# Patient Record
Sex: Male | Born: 1965
Health system: Southern US, Community
[De-identification: ages and names within clinical notes are randomized; demographics above are authoritative.]

## PROBLEM LIST (undated history)

## (undated) DIAGNOSIS — I509 Heart failure, unspecified: Secondary | ICD-10-CM

## (undated) DIAGNOSIS — Z5189 Encounter for other specified aftercare: Secondary | ICD-10-CM

## (undated) DIAGNOSIS — I10 Essential (primary) hypertension: Secondary | ICD-10-CM

## (undated) DIAGNOSIS — J45909 Unspecified asthma, uncomplicated: Secondary | ICD-10-CM

## (undated) DIAGNOSIS — D649 Anemia, unspecified: Secondary | ICD-10-CM

## (undated) DIAGNOSIS — R0600 Dyspnea, unspecified: Secondary | ICD-10-CM

## (undated) DIAGNOSIS — K5790 Diverticulosis of intestine, part unspecified, without perforation or abscess without bleeding: Secondary | ICD-10-CM

## (undated) DIAGNOSIS — E785 Hyperlipidemia, unspecified: Secondary | ICD-10-CM

## (undated) DIAGNOSIS — R011 Cardiac murmur, unspecified: Secondary | ICD-10-CM

## (undated) DIAGNOSIS — K529 Noninfective gastroenteritis and colitis, unspecified: Secondary | ICD-10-CM

## (undated) DIAGNOSIS — J449 Chronic obstructive pulmonary disease, unspecified: Secondary | ICD-10-CM

## (undated) DIAGNOSIS — K649 Unspecified hemorrhoids: Secondary | ICD-10-CM

## (undated) DIAGNOSIS — N189 Chronic kidney disease, unspecified: Secondary | ICD-10-CM

## (undated) DIAGNOSIS — G4733 Obstructive sleep apnea (adult) (pediatric): Secondary | ICD-10-CM

## (undated) HISTORY — DX: Essential (primary) hypertension: I10

## (undated) HISTORY — DX: Encounter for other specified aftercare: Z51.89

## (undated) HISTORY — DX: Chronic kidney disease, unspecified: N18.9

## (undated) HISTORY — PX: THORACENTESIS: SHX235

## (undated) HISTORY — DX: Noninfective gastroenteritis and colitis, unspecified: K52.9

## (undated) HISTORY — DX: Hyperlipidemia, unspecified: E78.5

## (undated) HISTORY — PX: KIDNEY TRANSPLANT: SHX239

## (undated) HISTORY — DX: Obstructive sleep apnea (adult) (pediatric): G47.33

## (undated) HISTORY — PX: OTHER SURGICAL HISTORY: SHX169

## (undated) SURGERY — Surgical Case
Anesthesia: *Unknown

---

## 1998-01-20 ENCOUNTER — Other Ambulatory Visit: Admission: RE | Admit: 1998-01-20 | Discharge: 1998-01-20 | Payer: Self-pay | Admitting: *Deleted

## 1998-01-25 ENCOUNTER — Ambulatory Visit (HOSPITAL_COMMUNITY): Admission: RE | Admit: 1998-01-25 | Discharge: 1998-01-25 | Payer: Self-pay | Admitting: Nephrology

## 1998-01-25 ENCOUNTER — Other Ambulatory Visit: Admission: RE | Admit: 1998-01-25 | Discharge: 1998-01-25 | Payer: Self-pay | Admitting: Nephrology

## 1998-12-25 ENCOUNTER — Ambulatory Visit (HOSPITAL_COMMUNITY): Admission: RE | Admit: 1998-12-25 | Discharge: 1998-12-25 | Payer: Self-pay | Admitting: Vascular Surgery

## 1998-12-25 ENCOUNTER — Encounter: Payer: Self-pay | Admitting: Vascular Surgery

## 1999-03-13 ENCOUNTER — Ambulatory Visit (HOSPITAL_COMMUNITY): Admission: RE | Admit: 1999-03-13 | Discharge: 1999-03-13 | Payer: Self-pay | Admitting: Vascular Surgery

## 1999-04-12 ENCOUNTER — Ambulatory Visit: Admission: RE | Admit: 1999-04-12 | Discharge: 1999-04-12 | Payer: Self-pay | Admitting: Vascular Surgery

## 1999-04-12 ENCOUNTER — Encounter: Payer: Self-pay | Admitting: Vascular Surgery

## 1999-04-23 ENCOUNTER — Ambulatory Visit: Admission: RE | Admit: 1999-04-23 | Discharge: 1999-04-23 | Payer: Self-pay

## 1999-05-07 ENCOUNTER — Encounter: Payer: Self-pay | Admitting: *Deleted

## 1999-05-07 ENCOUNTER — Ambulatory Visit (HOSPITAL_COMMUNITY): Admission: AD | Admit: 1999-05-07 | Discharge: 1999-05-07 | Payer: Self-pay | Admitting: *Deleted

## 1999-05-07 ENCOUNTER — Emergency Department (HOSPITAL_COMMUNITY): Admission: EM | Admit: 1999-05-07 | Discharge: 1999-05-07 | Payer: Self-pay | Admitting: Emergency Medicine

## 1999-05-11 ENCOUNTER — Ambulatory Visit (HOSPITAL_COMMUNITY): Admission: RE | Admit: 1999-05-11 | Discharge: 1999-05-11 | Payer: Self-pay | Admitting: Vascular Surgery

## 1999-05-15 ENCOUNTER — Observation Stay (HOSPITAL_COMMUNITY): Admission: RE | Admit: 1999-05-15 | Discharge: 1999-05-15 | Payer: Self-pay | Admitting: Interventional Radiology

## 1999-05-15 ENCOUNTER — Encounter: Payer: Self-pay | Admitting: Vascular Surgery

## 1999-05-16 ENCOUNTER — Encounter: Payer: Self-pay | Admitting: Vascular Surgery

## 1999-09-26 ENCOUNTER — Ambulatory Visit (HOSPITAL_COMMUNITY): Admission: RE | Admit: 1999-09-26 | Discharge: 1999-09-26 | Payer: Self-pay | Admitting: Vascular Surgery

## 1999-09-26 ENCOUNTER — Encounter: Payer: Self-pay | Admitting: Vascular Surgery

## 1999-10-18 ENCOUNTER — Encounter: Payer: Self-pay | Admitting: Emergency Medicine

## 1999-10-18 ENCOUNTER — Emergency Department (HOSPITAL_COMMUNITY): Admission: EM | Admit: 1999-10-18 | Discharge: 1999-10-18 | Payer: Self-pay | Admitting: Emergency Medicine

## 1999-11-18 ENCOUNTER — Ambulatory Visit (HOSPITAL_COMMUNITY): Admission: RE | Admit: 1999-11-18 | Discharge: 1999-11-18 | Payer: Self-pay | Admitting: Vascular Surgery

## 1999-11-18 ENCOUNTER — Encounter: Payer: Self-pay | Admitting: Vascular Surgery

## 2000-06-01 ENCOUNTER — Encounter: Payer: Self-pay | Admitting: Vascular Surgery

## 2000-06-02 ENCOUNTER — Inpatient Hospital Stay (HOSPITAL_COMMUNITY): Admission: RE | Admit: 2000-06-02 | Discharge: 2000-06-05 | Payer: Self-pay | Admitting: Vascular Surgery

## 2000-06-29 ENCOUNTER — Ambulatory Visit (HOSPITAL_COMMUNITY): Admission: RE | Admit: 2000-06-29 | Discharge: 2000-06-29 | Payer: Self-pay | Admitting: *Deleted

## 2000-08-20 ENCOUNTER — Ambulatory Visit (HOSPITAL_COMMUNITY): Admission: RE | Admit: 2000-08-20 | Discharge: 2000-08-20 | Payer: Self-pay | Admitting: *Deleted

## 2000-08-21 ENCOUNTER — Ambulatory Visit (HOSPITAL_COMMUNITY): Admission: RE | Admit: 2000-08-21 | Discharge: 2000-08-21 | Payer: Self-pay | Admitting: Vascular Surgery

## 2000-08-21 ENCOUNTER — Encounter: Payer: Self-pay | Admitting: Vascular Surgery

## 2000-09-26 ENCOUNTER — Ambulatory Visit (HOSPITAL_COMMUNITY): Admission: RE | Admit: 2000-09-26 | Discharge: 2000-09-26 | Payer: Self-pay | Admitting: Nephrology

## 2001-01-25 ENCOUNTER — Encounter: Payer: Self-pay | Admitting: Nephrology

## 2001-01-25 ENCOUNTER — Ambulatory Visit (HOSPITAL_COMMUNITY): Admission: RE | Admit: 2001-01-25 | Discharge: 2001-01-25 | Payer: Self-pay | Admitting: Nephrology

## 2001-02-08 ENCOUNTER — Encounter: Admission: RE | Admit: 2001-02-08 | Discharge: 2001-02-08 | Payer: Self-pay | Admitting: *Deleted

## 2013-03-27 ENCOUNTER — Encounter: Payer: Self-pay | Admitting: Pulmonary Disease

## 2013-03-27 ENCOUNTER — Ambulatory Visit (INDEPENDENT_AMBULATORY_CARE_PROVIDER_SITE_OTHER): Payer: BC Managed Care – PPO | Admitting: Pulmonary Disease

## 2013-03-27 VITALS — BP 172/110 | HR 76 | Temp 98.4°F | Ht 70.0 in | Wt 227.0 lb

## 2013-03-27 DIAGNOSIS — R0683 Snoring: Secondary | ICD-10-CM

## 2013-03-27 DIAGNOSIS — G4733 Obstructive sleep apnea (adult) (pediatric): Secondary | ICD-10-CM | POA: Insufficient documentation

## 2013-03-27 DIAGNOSIS — R0609 Other forms of dyspnea: Secondary | ICD-10-CM

## 2013-03-27 HISTORY — DX: Obstructive sleep apnea (adult) (pediatric): G47.33

## 2013-03-27 NOTE — Patient Instructions (Signed)
Will arrange for sleep study Will call to arrange for follow up after sleep study reviewed 

## 2013-03-27 NOTE — Progress Notes (Signed)
Chief Complaint  Patient presents with  . Sleep Consult    self referral. Epworth Score: 8.    History of Present Illness: Jonathon Conley is a 47 y.o. male for evaluation of sleep problems.  His wife has been concerned about his snoring.  This has been present for years, and has been getting worse.  She is concerned that he stops breathing while asleep.  He will wake up hearing himself snore.  He has trouble sleeping on his back and will get a dry mouth at night.  He has a history of refractory hypertension and this caused renal failure, leading to renal transplant.  He met a friend recently who was diagnosed with sleep apnea, and was doing much better after treatment.  As a result he decide to have his sleep problems investigated further.  He takes lasix at 10 pm.  He goes to sleep at 12am.  He falls asleep in about 30 minutes after watching TV.  He wakes up 2 to 3 times to use the bathroom, and goes back to sleep after about 15 to 20 minutes.  He gets out of bed at 7 am.  He feels tired in the extremely tired in the morning.  He denies morning headache.  He does not use anything to help him fall sleep or stay awake.  He occasional gets funny feelings in his legs.  He denies sleep walking, sleep talking, bruxism, or nightmares.  He denies sleep hallucinations, sleep paralysis, or cataplexy.  The Epworth score is 8 out of 24.  Tests:   Jonathon Conley  has a past medical history of Hypertension; Hyperlipidemia; and Chronic kidney disease.  Jonathon Conley  has past surgical history that includes Kidney transplant.  Prior to Admission medications   Medication Sig Start Date End Date Taking? Authorizing Provider  albuterol (PROVENTIL HFA;VENTOLIN HFA) 108 (90 BASE) MCG/ACT inhaler Inhale 2 puffs into the lungs every 6 (six) hours as needed for wheezing.   Yes Historical Provider, MD  amLODipine (NORVASC) 10 MG tablet Take 10 mg by mouth daily.   Yes Historical Provider, MD  atenolol (TENORMIN) 100  MG tablet Take 100 mg by mouth daily.   Yes Historical Provider, MD  doxazosin (CARDURA) 8 MG tablet Take 8 mg by mouth at bedtime.   Yes Historical Provider, MD  enalapril (VASOTEC) 10 MG tablet Take 10 mg by mouth daily.   Yes Historical Provider, MD  mycophenolate (CELLCEPT) 250 MG capsule Take 750 mg by mouth 2 (two) times daily.   Yes Historical Provider, MD  predniSONE (DELTASONE) 5 MG tablet Take 5 mg by mouth daily.   Yes Historical Provider, MD  simvastatin (ZOCOR) 5 MG tablet Take 5 mg by mouth at bedtime.   Yes Historical Provider, MD  tacrolimus (PROGRAF) 1 MG capsule Take 11 mg by mouth 2 (two) times daily.   Yes Historical Provider, MD    No Known Allergies  His family history includes Asthma in his sons; Cancer in his maternal grandfather and maternal grandmother; and Heart disease in his mother.  He  reports that he has never smoked. He has never used smokeless tobacco. He reports that he does not drink alcohol or use illicit drugs.  Review of Systems  Constitutional: Positive for unexpected weight change. Negative for fever, chills, diaphoresis, activity change, appetite change and fatigue.  HENT: Positive for congestion, sneezing and dental problem. Negative for hearing loss, ear pain, nosebleeds, sore throat, facial swelling, rhinorrhea, mouth sores, trouble swallowing, neck pain,  neck stiffness, voice change, postnasal drip, sinus pressure, tinnitus and ear discharge.   Eyes: Negative for photophobia, discharge, itching and visual disturbance.  Respiratory: Positive for shortness of breath. Negative for apnea, cough, choking, chest tightness, wheezing and stridor.   Cardiovascular: Negative for chest pain, palpitations and leg swelling.  Gastrointestinal: Negative for nausea, vomiting, abdominal pain, constipation, blood in stool and abdominal distention.  Genitourinary: Negative for dysuria, urgency, frequency, hematuria, flank pain, decreased urine volume and difficulty  urinating.  Musculoskeletal: Positive for joint swelling. Negative for myalgias, back pain, arthralgias and gait problem.  Skin: Negative for color change, pallor and rash.  Neurological: Negative for dizziness, tremors, seizures, syncope, speech difficulty, weakness, light-headedness, numbness and headaches.  Hematological: Negative for adenopathy. Does not bruise/bleed easily.  Psychiatric/Behavioral: Negative for confusion, sleep disturbance and agitation. The patient is not nervous/anxious.    Physical Exam:  General - No distress ENT - No sinus tenderness, MP 3, enlarged tongue, poor dentition, no oral exudate, no LAN, no thyromegaly, TM clear, pupils equal/reactive Cardiac - s1s2 regular, no murmur, pulses symmetric Chest - No wheeze/rales/dullness, good air entry, normal respiratory excursion Back - No focal tenderness Abd - Soft, non-tender, no organomegaly, + bowel sounds Ext - No edema, prior graft site in Rt arm Neuro - Normal strength, cranial nerves intact Skin - No rashes Psych - Normal mood, and behavior

## 2013-03-27 NOTE — Progress Notes (Deleted)
  Subjective:    Patient ID: Jonathon Conley, male    DOB: 04-24-1966, 47 y.o.   MRN: [id number]  HPI    Review of Systems  Constitutional: Positive for unexpected weight change. Negative for fever, chills, diaphoresis, activity change, appetite change and fatigue.  HENT: Positive for congestion, sneezing and dental problem. Negative for hearing loss, ear pain, nosebleeds, sore throat, facial swelling, rhinorrhea, mouth sores, trouble swallowing, neck pain, neck stiffness, voice change, postnasal drip, sinus pressure, tinnitus and ear discharge.   Eyes: Negative for photophobia, discharge, itching and visual disturbance.  Respiratory: Positive for shortness of breath. Negative for apnea, cough, choking, chest tightness, wheezing and stridor.   Cardiovascular: Negative for chest pain, palpitations and leg swelling.  Gastrointestinal: Negative for nausea, vomiting, abdominal pain, constipation, blood in stool and abdominal distention.  Genitourinary: Negative for dysuria, urgency, frequency, hematuria, flank pain, decreased urine volume and difficulty urinating.  Musculoskeletal: Positive for joint swelling. Negative for myalgias, back pain, arthralgias and gait problem.  Skin: Negative for color change, pallor and rash.  Neurological: Negative for dizziness, tremors, seizures, syncope, speech difficulty, weakness, light-headedness, numbness and headaches.  Hematological: Negative for adenopathy. Does not bruise/bleed easily.  Psychiatric/Behavioral: Negative for confusion, sleep disturbance and agitation. The patient is not nervous/anxious.        Objective:   Physical Exam        Assessment & Plan:

## 2013-03-27 NOTE — Assessment & Plan Note (Signed)
He reports snoring, sleep disruption, witnessed apnea, and daytime sleepiness.  He has history of hypertension causing CKD.  I am concerned he could have sleep apnea.  We discussed how sleep apnea can affect various health problems including risks for hypertension, cardiovascular disease, and diabetes.  We also discussed how sleep disruption can increase risks for accident, such as while driving.  Weight loss as a means of improving sleep apnea was also reviewed.  Additional treatment options discussed were CPAP therapy, oral appliance, and surgical intervention.  To further assess will arrange for in lab sleep study.

## 2013-03-30 ENCOUNTER — Ambulatory Visit (HOSPITAL_BASED_OUTPATIENT_CLINIC_OR_DEPARTMENT_OTHER): Payer: BC Managed Care – PPO | Attending: Pulmonary Disease

## 2013-03-30 VITALS — Ht 71.0 in | Wt 227.0 lb

## 2013-03-30 DIAGNOSIS — R0683 Snoring: Secondary | ICD-10-CM

## 2013-03-30 DIAGNOSIS — N189 Chronic kidney disease, unspecified: Secondary | ICD-10-CM | POA: Insufficient documentation

## 2013-03-30 DIAGNOSIS — Z9989 Dependence on other enabling machines and devices: Secondary | ICD-10-CM

## 2013-03-30 DIAGNOSIS — G4733 Obstructive sleep apnea (adult) (pediatric): Secondary | ICD-10-CM | POA: Insufficient documentation

## 2013-03-30 DIAGNOSIS — I129 Hypertensive chronic kidney disease with stage 1 through stage 4 chronic kidney disease, or unspecified chronic kidney disease: Secondary | ICD-10-CM | POA: Insufficient documentation

## 2013-04-08 ENCOUNTER — Telehealth: Payer: Self-pay | Admitting: Pulmonary Disease

## 2013-04-08 DIAGNOSIS — G4733 Obstructive sleep apnea (adult) (pediatric): Secondary | ICD-10-CM

## 2013-04-08 NOTE — Telephone Encounter (Signed)
LMTCB

## 2013-04-08 NOTE — Telephone Encounter (Signed)
PSG 03/30/13 >> AHI 14.6, SpO2 low 91%.  Sup-optimal titration >> centrals with CPAP and BiPAP.  Will have my nurse schedule ROV to review results.

## 2013-04-08 NOTE — Procedures (Signed)
NAME:  Jonathon Conley, Jonathon Conley NO.:  [id number]  MEDICAL RECORD NO.:  [id number]          PATIENT TYPE:  OUT  LOCATION:  SLEEP CENTER                 FACILITY:  [facility]  PHYSICIAN:  [name], MD        DATE OF BIRTH:  05-Apr-1966  DATE OF STUDY:  03/30/2013                           NOCTURNAL POLYSOMNOGRAM  REFERRING PHYSICIAN:  [name], MD  FACILITY:  [facility] PHYSICIAN:  [name], MD  INDICATION FOR STUDY:  [name] is a 47 year old male who has a history of hypertension and chronic kidney disease.  He also reports snoring, sleep disruption, and daytime sleepiness.  He is referred to sleep lab for evaluation of hypersomnia with obstructive sleep apnea.  Height is 5 feet 11 inches, weight is 227 pounds, BMI is 32.  Neck size is 16 inches.  EPWORTH SLEEPINESS SCORE:  10.  MEDICATIONS:  Reviewed in his medical chart.  SLEEP ARCHITECTURE:  Total recording time was 155 minutes.  Total sleep time was 160 minutes.  Sleep efficiency was 94%.  Sleep latency was 8.5 minutes.  REM latency was 152 minutes.  This portion of the study was notable for lack of supine sleep, and he was observed in all stages of sleep otherwise.  During the titration portion of study, total recording time was 225 minutes.  Total sleep time was 133 minutes.  Sleep efficiency was 59%. Sleep latency was 1 minute.  This portion of the study was notable for lack of slow-wave sleep and he slept in both supine and non-supine positions.  RESPIRATORY DATA:  The average respiratory rate was 16.  Mild snoring was noted by the technician.  During the diagnostic portion of the study, the overall apnea/hypopnea index was 14.6.  There was 1 central apneic event.  The remainder of the events were obstructive in nature.  During the titration portion of the study, the patient is on CPAP of 4 and increasing 9 cm water.  He had development of central apneas with CPAP.  He was  therefore transitioned to BiPAP.  He was started on BiPAP at 13/9, increased to 21/17.  In spite of this transition, he continued to have central apneic events.  OXYGEN DATA:  The baseline oxygenation was 92%.  The oxygen saturation nadir was 91%.  The study was conducted without the use of supplemental oxygen.  CARDIAC DATA:  The average heart rate is 58 and the rhythm strip showed sinus rhythm.  MOVEMENT-PARASOMNIA:  The periodic limb movement index was 0 and the patient had 1 restroom trip.  IMPRESSION:  This study shows evidence for mild-to-moderate obstructive sleep apnea with an apnea/hypopnea index of 14.6 and the oxygen saturation nadir of 91%.  He had difficulties with CPAP or BiPAP titration.  With positive airway pressure therapy, he developed central apneic events.  Additional therapeutic interventions could include having the patient return to the sleep lab for a repeat full night titration study. Alternatively, he could try oral appliance or surgical intervention.     [name], MD [place], [place] Board of Sleep Medicine    VS/MEDQ  D:  04/08/2013 11:16:43  T:  04/08/2013 12:03:55  Job:  [id number]

## 2013-04-10 NOTE — Telephone Encounter (Signed)
Pt returned call. Jonathon Conley  

## 2013-04-10 NOTE — Telephone Encounter (Signed)
LMTCB x2  

## 2013-04-10 NOTE — Telephone Encounter (Signed)
Pt has been scheduled for 05/02/2013 at 4:30pm.

## 2013-05-02 ENCOUNTER — Ambulatory Visit (INDEPENDENT_AMBULATORY_CARE_PROVIDER_SITE_OTHER): Payer: BC Managed Care – PPO | Admitting: Pulmonary Disease

## 2013-05-02 ENCOUNTER — Encounter: Payer: Self-pay | Admitting: Pulmonary Disease

## 2013-05-02 VITALS — BP 136/90 | HR 77 | Temp 98.7°F | Ht 70.0 in | Wt 230.0 lb

## 2013-05-02 DIAGNOSIS — G4733 Obstructive sleep apnea (adult) (pediatric): Secondary | ICD-10-CM

## 2013-05-02 NOTE — Assessment & Plan Note (Signed)
He has mild to moderate obstructive sleep apnea.  I have reviewed the recent sleep study results with the patient.  We discussed how sleep apnea can affect various health problems including risks for hypertension, cardiovascular disease, and diabetes.  We also discussed how sleep disruption can increase risks for accident, such as while driving.  Weight loss as a means of improving sleep apnea was also reviewed.  Additional treatment options discussed were CPAP therapy, oral appliance, and surgical intervention.  Will arrange for auto CPAP set up.  He will do comparison shopping to determine if he can get set up on his own at less expense.

## 2013-05-02 NOTE — Patient Instructions (Signed)
Will arrange for CPAP set up Follow up in 2 months

## 2013-05-02 NOTE — Progress Notes (Signed)
Chief Complaint  Patient presents with  . Follow-up    review sleep study.  no other complaints today.    History of Present Illness: Jonathon Conley is a 47 y.o. male with OSA.  He is here to review his sleep study.  He felt great the next day after using CPAP during the sleep study.  He is anxious to get started.   TESTS: PSG 03/30/13 >> AHI 14.6, SpO2 low 91%.  Sup-optimal titration >> centrals with CPAP and BiPAP.  Jonathon Conley  has a past medical history of Hypertension; Hyperlipidemia; Chronic kidney disease; and OSA (obstructive sleep apnea) (03/27/2013).  Jonathon Conley  has past surgical history that includes Kidney transplant.  Prior to Admission medications   Medication Sig Start Date End Date Taking? Authorizing Provider  albuterol (PROVENTIL HFA;VENTOLIN HFA) 108 (90 BASE) MCG/ACT inhaler Inhale 2 puffs into the lungs every 6 (six) hours as needed for wheezing.   Yes Historical Provider, MD  amLODipine (NORVASC) 10 MG tablet Take 10 mg by mouth daily.   Yes Historical Provider, MD  atenolol (TENORMIN) 100 MG tablet Take 100 mg by mouth daily.   Yes Historical Provider, MD  doxazosin (CARDURA) 8 MG tablet Take 8 mg by mouth at bedtime.   Yes Historical Provider, MD  enalapril (VASOTEC) 10 MG tablet Take 10 mg by mouth daily.   Yes Historical Provider, MD  mycophenolate (CELLCEPT) 250 MG capsule Take 750 mg by mouth 2 (two) times daily.   Yes Historical Provider, MD  predniSONE (DELTASONE) 5 MG tablet Take 5 mg by mouth daily.   Yes Historical Provider, MD  simvastatin (ZOCOR) 5 MG tablet Take 5 mg by mouth at bedtime.   Yes Historical Provider, MD  tacrolimus (PROGRAF) 1 MG capsule Take 11 mg by mouth 2 (two) times daily.   Yes Historical Provider, MD    No Known Allergies   Physical Exam:  General - No distress ENT - No sinus tenderness, MP 3, enlarged tongue, poor dentition, no oral exudate, no LAN Cardiac - s1s2 regular, no murmur Chest - No  wheeze/rales/dullness Back - No focal tenderness Abd - Soft, non-tender Ext - No edema Neuro - Normal strength Skin - No rashes Psych - normal mood, and behavior   Assessment/Plan:  [name], MD [facility] Pager:  [phone]

## 2013-07-07 ENCOUNTER — Ambulatory Visit: Payer: BC Managed Care – PPO | Admitting: Pulmonary Disease

## 2013-07-10 ENCOUNTER — Encounter: Payer: Self-pay | Admitting: Pulmonary Disease

## 2014-03-06 ENCOUNTER — Ambulatory Visit (INDEPENDENT_AMBULATORY_CARE_PROVIDER_SITE_OTHER): Payer: PRIVATE HEALTH INSURANCE | Admitting: Family Medicine

## 2014-03-06 VITALS — BP 132/90 | HR 77 | Temp 98.2°F | Resp 18 | Ht 69.75 in | Wt 221.6 lb

## 2014-03-06 DIAGNOSIS — J329 Chronic sinusitis, unspecified: Secondary | ICD-10-CM

## 2014-03-06 DIAGNOSIS — J209 Acute bronchitis, unspecified: Secondary | ICD-10-CM

## 2014-03-06 MED ORDER — AMOXICILLIN-POT CLAVULANATE 875-125 MG PO TABS: 1.0000 | ORAL_TABLET | Freq: Two times a day (BID) | ORAL | Status: DC

## 2014-03-06 MED ORDER — PREDNISONE 20 MG PO TABS: ORAL_TABLET | ORAL | Status: DC

## 2014-03-06 NOTE — Progress Notes (Signed)
°  This chart was scribed for Caroleen Hamman, MD by Eston Mould, ED Scribe. This patient was seen in room Room/bed 2 and the patient's care was started at 12:13 PM. Subjective:    Patient ID: Jonathon Conley, male    DOB: 1966-05-31, 48 y.o.   MRN: [id number] Chief Complaint  Patient presents with   Cough    x 3 days    HPI Jonathon Conley is a 48 y.o. male who presents to the [facility] complaining of persistent cough and congestion with associated fatigue that began 3 days ago. Pt states he cut his grass 3 days ago and reports waking up the following day with persistent cough and sputum. He reports taking OTC Coricidin but denies having any relief. He reports having his inhaler but states he does not frequently use his inhaler. Hx of kidney transplant due to HTN in 2001. He states his kidneys have properly been working and denies having any complications. Denies having medication allergies.  Work in Conservator, museum/gallery at [employer]. States he has 3 children (26,73, & 78 year olds). PCP is Dr. Erling Cruz.   Review of Systems  HENT: Positive for congestion.   Respiratory: Positive for cough.   All other systems reviewed and are negative.  Objective:   Physical Exam  Nursing note and vitals reviewed. Constitutional: He is oriented to person, place, and time. He appears well-developed and well-nourished. No distress.  HENT:  Head: Normocephalic and atraumatic.  Right Ear: External ear normal.  Left Ear: External ear normal.  Mild white film to his tongue and posterior oropharynx. Lymphadenopathy.   Eyes: EOM are normal.  Neck: Normal range of motion. Neck supple. No tracheal deviation present.  Cardiovascular: Normal rate, regular rhythm and normal heart sounds.   Pulmonary/Chest: Effort normal. No respiratory distress. He has wheezes.  Inspiratory and expiratory wheezes.  Musculoskeletal: Normal range of motion.  Neurological: He is alert and oriented to person, place, and time.    Skin: Skin is warm and dry. No rash noted.  Large scare to R forearm where he had a shunt before.  Psychiatric: He has a normal mood and affect. His behavior is normal.   Triage Vitals:BP 132/90   Pulse 77   Temp(Src) 98.2 F (36.8 C) (Oral)   Resp 18   Ht 5' 9.75" (1.772 m)   Wt 221 lb 9.6 oz (100.517 kg)   BMI 32.01 kg/m2   SpO2 99% Assessment & Plan:  Informed pt to avoid taking Coricidin . Will discharge pt with Prednisone. Acute bronchitis - Plan: predniSONE (DELTASONE) 20 MG tablet, amoxicillin-clavulanate (AUGMENTIN) 875-125 MG per tablet  Sinusitis - Plan: amoxicillin-clavulanate (AUGMENTIN) 875-125 MG per tablet  Signed, Robyn Haber, MD

## 2014-03-06 NOTE — Patient Instructions (Signed)
Bronchospasm, Adult A bronchospasm is a spasm or tightening of the airways going into the lungs. During a bronchospasm breathing becomes more difficult because the airways get smaller. When this happens there can be coughing, a whistling sound when breathing (wheezing), and difficulty breathing. Bronchospasm is often associated with asthma, but not all patients who experience a bronchospasm have asthma. CAUSES  A bronchospasm is caused by inflammation or irritation of the airways. The inflammation or irritation may be triggered by:   Allergies (such as to animals, pollen, food, or mold). Allergens that cause bronchospasm may cause wheezing immediately after exposure or many hours later.   Infection. Viral infections are believed to be the most common cause of bronchospasm.   Exercise.   Irritants (such as pollution, cigarette smoke, strong odors, aerosol sprays, and paint fumes).   Weather changes. Winds increase molds and pollens in the air. Rain refreshes the air by washing irritants out. Cold air may cause inflammation.   Stress and emotional upset.  SIGNS AND SYMPTOMS   Wheezing.   Excessive nighttime coughing.   Frequent or severe coughing with a simple cold.   Chest tightness.   Shortness of breath.  DIAGNOSIS  Bronchospasm is usually diagnosed through a history and physical exam. Tests, such as chest X-rays, are sometimes done to look for other conditions. TREATMENT   Inhaled medicines can be given to open up your airways and help you breathe. The medicines can be given using either an inhaler or a nebulizer machine.  Corticosteroid medicines may be given for severe bronchospasm, usually when it is associated with asthma. HOME CARE INSTRUCTIONS   Always have a plan prepared for seeking medical care. Know when to call your health care provider and local emergency services (911 in the [place] Know where you can access local emergency care.  Only take medicines as  directed by your health care provider.  If you were prescribed an inhaler or nebulizer machine, ask your health care provider to explain how to use it correctly. Always use a spacer with your inhaler if you were given one.  It is necessary to remain calm during an attack. Try to relax and breathe more slowly.  Control your home environment in the following ways:   Change your heating and air conditioning filter at least once a month.   Limit your use of fireplaces and wood stoves.  Do not smoke and do not allow smoking in your home.   Avoid exposure to perfumes and fragrances.   Get rid of pests (such as roaches and mice) and their droppings.   Throw away plants if you see mold on them.   Keep your house clean and dust free.   Replace carpet with wood, tile, or vinyl flooring. Carpet can trap dander and dust.   Use allergy-proof pillows, mattress covers, and box spring covers.   Wash bed sheets and blankets every week in hot water and dry them in a dryer.   Use blankets that are made of polyester or cotton.   Wash hands frequently. SEEK MEDICAL CARE IF:   You have muscle aches.   You have chest pain.   The sputum changes from clear or white to yellow, green, gray, or bloody.   The sputum you cough up gets thicker.   There are problems that may be related to the medicine you are given, such as a rash, itching, swelling, or trouble breathing.  SEEK IMMEDIATE MEDICAL CARE IF:   You have worsening wheezing and coughing  even after taking your prescribed medicines.   You have increased difficulty breathing.   You develop severe chest pain. MAKE SURE YOU:   Understand these instructions.  Will watch your condition.  Will get help right away if you are not doing well or get worse. Document Released: 10/05/2003 Document Revised: 06/04/2013 Document Reviewed: 03/24/2013 [facility] Patient Information 2014 [place]. Sinusitis Sinusitis is  redness, soreness, and swelling (inflammation) of the paranasal sinuses. Paranasal sinuses are air pockets within the bones of your face (beneath the eyes, the middle of the forehead, or above the eyes). In healthy paranasal sinuses, mucus is able to drain out, and air is able to circulate through them by way of your nose. However, when your paranasal sinuses are inflamed, mucus and air can become trapped. This can allow bacteria and other germs to grow and cause infection. Sinusitis can develop quickly and last only a short time (acute) or continue over a long period (chronic). Sinusitis that lasts for more than 12 weeks is considered chronic.  CAUSES  Causes of sinusitis include:  Allergies.  Structural abnormalities, such as displacement of the cartilage that separates your nostrils (deviated septum), which can decrease the air flow through your nose and sinuses and affect sinus drainage.  Functional abnormalities, such as when the small hairs (cilia) that line your sinuses and help remove mucus do not work properly or are not present. SYMPTOMS  Symptoms of acute and chronic sinusitis are the same. The primary symptoms are pain and pressure around the affected sinuses. Other symptoms include:  Upper toothache.  Earache.  Headache.  Bad breath.  Decreased sense of smell and taste.  A cough, which worsens when you are lying flat.  Fatigue.  Fever.  Thick drainage from your nose, which often is green and may contain pus (purulent).  Swelling and warmth over the affected sinuses. DIAGNOSIS  Your caregiver will perform a physical exam. During the exam, your caregiver may:  Look in your nose for signs of abnormal growths in your nostrils (nasal polyps).  Tap over the affected sinus to check for signs of infection.  View the inside of your sinuses (endoscopy) with a special imaging device with a light attached (endoscope), which is inserted into your sinuses. If your caregiver  suspects that you have chronic sinusitis, one or more of the following tests may be recommended:  Allergy tests.  Nasal culture A sample of mucus is taken from your nose and sent to a lab and screened for bacteria.  Nasal cytology A sample of mucus is taken from your nose and examined by your caregiver to determine if your sinusitis is related to an allergy. TREATMENT  Most cases of acute sinusitis are related to a viral infection and will resolve on their own within 10 days. Sometimes medicines are prescribed to help relieve symptoms (pain medicine, decongestants, nasal steroid sprays, or saline sprays).  However, for sinusitis related to a bacterial infection, your caregiver will prescribe antibiotic medicines. These are medicines that will help kill the bacteria causing the infection.  Rarely, sinusitis is caused by a fungal infection. In theses cases, your caregiver will prescribe antifungal medicine. For some cases of chronic sinusitis, surgery is needed. Generally, these are cases in which sinusitis recurs more than 3 times per year, despite other treatments. HOME CARE INSTRUCTIONS   Drink plenty of water. Water helps thin the mucus so your sinuses can drain more easily.  Use a humidifier.  Inhale steam 3 to 4 times a  day (for example, sit in the bathroom with the shower running).  Apply a warm, moist washcloth to your face 3 to 4 times a day, or as directed by your caregiver.  Use saline nasal sprays to help moisten and clean your sinuses.  Take over-the-counter or prescription medicines for pain, discomfort, or fever only as directed by your caregiver. SEEK IMMEDIATE MEDICAL CARE IF:  You have increasing pain or severe headaches.  You have nausea, vomiting, or drowsiness.  You have swelling around your face.  You have vision problems.  You have a stiff neck.  You have difficulty breathing. MAKE SURE YOU:   Understand these instructions.  Will watch your  condition.  Will get help right away if you are not doing well or get worse. Document Released: 10/02/2005 Document Revised: 12/25/2011 Document Reviewed: 10/17/2011 [facility] Patient Information 2014 [place], [place].

## 2014-07-21 ENCOUNTER — Other Ambulatory Visit: Payer: Self-pay | Admitting: Family Medicine

## 2015-02-14 ENCOUNTER — Ambulatory Visit (INDEPENDENT_AMBULATORY_CARE_PROVIDER_SITE_OTHER): Payer: PRIVATE HEALTH INSURANCE

## 2015-02-14 ENCOUNTER — Emergency Department (HOSPITAL_COMMUNITY): Payer: PRIVATE HEALTH INSURANCE

## 2015-02-14 ENCOUNTER — Encounter (HOSPITAL_COMMUNITY): Payer: Self-pay

## 2015-02-14 ENCOUNTER — Ambulatory Visit (INDEPENDENT_AMBULATORY_CARE_PROVIDER_SITE_OTHER): Payer: PRIVATE HEALTH INSURANCE | Admitting: Family Medicine

## 2015-02-14 ENCOUNTER — Inpatient Hospital Stay (HOSPITAL_COMMUNITY)
Admission: EM | Admit: 2015-02-14 | Discharge: 2015-02-17 | DRG: 682 | Disposition: A | Discharge status: Home / Self Care | Payer: PRIVATE HEALTH INSURANCE | Attending: Internal Medicine | Admitting: Internal Medicine

## 2015-02-14 ENCOUNTER — Inpatient Hospital Stay (HOSPITAL_COMMUNITY): Payer: PRIVATE HEALTH INSURANCE

## 2015-02-14 VITALS — BP 186/104 | HR 88 | Temp 98.6°F | Resp 17 | Ht 70.5 in | Wt 226.0 lb

## 2015-02-14 DIAGNOSIS — Z940 Kidney transplant status: Secondary | ICD-10-CM

## 2015-02-14 DIAGNOSIS — J45909 Unspecified asthma, uncomplicated: Secondary | ICD-10-CM | POA: Diagnosis present

## 2015-02-14 DIAGNOSIS — E872 Acidosis: Secondary | ICD-10-CM | POA: Diagnosis present

## 2015-02-14 DIAGNOSIS — N179 Acute kidney failure, unspecified: Principal | ICD-10-CM | POA: Diagnosis present

## 2015-02-14 DIAGNOSIS — N186 End stage renal disease: Secondary | ICD-10-CM | POA: Diagnosis present

## 2015-02-14 DIAGNOSIS — E611 Iron deficiency: Secondary | ICD-10-CM | POA: Diagnosis present

## 2015-02-14 DIAGNOSIS — D649 Anemia, unspecified: Secondary | ICD-10-CM

## 2015-02-14 DIAGNOSIS — R0602 Shortness of breath: Secondary | ICD-10-CM

## 2015-02-14 DIAGNOSIS — R002 Palpitations: Secondary | ICD-10-CM | POA: Diagnosis not present

## 2015-02-14 DIAGNOSIS — I5033 Acute on chronic diastolic (congestive) heart failure: Secondary | ICD-10-CM | POA: Diagnosis present

## 2015-02-14 DIAGNOSIS — E785 Hyperlipidemia, unspecified: Secondary | ICD-10-CM | POA: Diagnosis present

## 2015-02-14 DIAGNOSIS — I120 Hypertensive chronic kidney disease with stage 5 chronic kidney disease or end stage renal disease: Secondary | ICD-10-CM | POA: Diagnosis present

## 2015-02-14 DIAGNOSIS — R5383 Other fatigue: Secondary | ICD-10-CM | POA: Diagnosis not present

## 2015-02-14 DIAGNOSIS — D631 Anemia in chronic kidney disease: Secondary | ICD-10-CM | POA: Diagnosis present

## 2015-02-14 DIAGNOSIS — IMO0002 Reserved for concepts with insufficient information to code with codable children: Secondary | ICD-10-CM

## 2015-02-14 DIAGNOSIS — Z9114 Patient's other noncompliance with medication regimen: Secondary | ICD-10-CM | POA: Diagnosis present

## 2015-02-14 DIAGNOSIS — N189 Chronic kidney disease, unspecified: Secondary | ICD-10-CM

## 2015-02-14 DIAGNOSIS — N184 Chronic kidney disease, stage 4 (severe): Secondary | ICD-10-CM | POA: Diagnosis not present

## 2015-02-14 DIAGNOSIS — Z7982 Long term (current) use of aspirin: Secondary | ICD-10-CM | POA: Diagnosis not present

## 2015-02-14 DIAGNOSIS — Z992 Dependence on renal dialysis: Secondary | ICD-10-CM

## 2015-02-14 DIAGNOSIS — G4733 Obstructive sleep apnea (adult) (pediatric): Secondary | ICD-10-CM | POA: Diagnosis present

## 2015-02-14 DIAGNOSIS — I509 Heart failure, unspecified: Secondary | ICD-10-CM | POA: Diagnosis not present

## 2015-02-14 LAB — URINALYSIS, ROUTINE W REFLEX MICROSCOPIC
BILIRUBIN URINE: NEGATIVE
Glucose, UA: 100 mg/dL — AB
Ketones, ur: NEGATIVE mg/dL
Leukocytes, UA: NEGATIVE
NITRITE: NEGATIVE
PH: 5.5 (ref 5.0–8.0)
Protein, ur: >300 mg/dL — AB
SPECIFIC GRAVITY, URINE: 1.015 (ref 1.005–1.030)
Urobilinogen, UA: 0.2 mg/dL (ref 0.0–1.0)

## 2015-02-14 LAB — POCT CBC
GRANULOCYTE PERCENT: 66.8 % (ref 37–80)
HCT, POC: 23.9 % — AB (ref 43.5–53.7)
Hemoglobin: 7.4 g/dL — AB (ref 14.1–18.1)
Lymph, poc: 1.2 (ref 0.6–3.4)
MCH, POC: 25 pg — AB (ref 27–31.2)
MCHC: 31 g/dL — AB (ref 31.8–35.4)
MCV: 80.9 fL (ref 80–97)
MID (cbc): 0.2 (ref 0–0.9)
MPV: 6.7 fL (ref 0–99.8)
PLATELET COUNT, POC: 229 10*3/uL (ref 142–424)
POC GRANULOCYTE: 2.8 (ref 2–6.9)
POC LYMPH PERCENT: 27.8 %L (ref 10–50)
POC MID %: 5.4 %M (ref 0–12)
RBC: 2.86 M/uL — AB (ref 4.69–6.13)
RDW, POC: 19.1 %
WBC: 4.2 10*3/uL — AB (ref 4.6–10.2)

## 2015-02-14 LAB — BASIC METABOLIC PANEL
ANION GAP: 13 (ref 5–15)
BUN: 66 mg/dL — ABNORMAL HIGH (ref 6–20)
CALCIUM: 6.5 mg/dL — AB (ref 8.9–10.3)
CO2: 16 mmol/L — AB (ref 22–32)
Chloride: 113 mmol/L — ABNORMAL HIGH (ref 101–111)
Creatinine, Ser: 12.19 mg/dL — ABNORMAL HIGH (ref 0.61–1.24)
GFR calc Af Amer: 5 mL/min — ABNORMAL LOW (ref >60)
GFR calc non Af Amer: 4 mL/min — ABNORMAL LOW (ref >60)
GLUCOSE: 104 mg/dL — AB (ref 70–99)
POTASSIUM: 4.4 mmol/L (ref 3.5–5.1)
Sodium: 142 mmol/L (ref 135–145)

## 2015-02-14 LAB — CBC
HEMATOCRIT: 24.7 % — AB (ref 39.0–52.0)
HEMOGLOBIN: 7.7 g/dL — AB (ref 13.0–17.0)
MCH: 25.7 pg — AB (ref 26.0–34.0)
MCHC: 31.2 g/dL (ref 30.0–36.0)
MCV: 82.3 fL (ref 78.0–100.0)
Platelets: 185 10*3/uL (ref 150–400)
RBC: 3 MIL/uL — ABNORMAL LOW (ref 4.22–5.81)
RDW: 16.7 % — ABNORMAL HIGH (ref 11.5–15.5)
WBC: 4.4 10*3/uL (ref 4.0–10.5)

## 2015-02-14 LAB — I-STAT TROPONIN, ED: Troponin i, poc: 0.03 ng/mL (ref 0.00–0.08)

## 2015-02-14 LAB — BRAIN NATRIURETIC PEPTIDE: B NATRIURETIC PEPTIDE 5: 1305.9 pg/mL — AB (ref 0.0–100.0)

## 2015-02-14 LAB — URINE MICROSCOPIC-ADD ON

## 2015-02-14 MED ORDER — SODIUM BICARBONATE 650 MG PO TABS
650.0000 mg | ORAL_TABLET | Freq: Two times a day (BID) | ORAL | Status: DC
  Administered 2015-02-15 – 2015-02-17 (×6): 650 mg via ORAL
  Filled 2015-02-14 (×7): qty 1

## 2015-02-14 MED ORDER — HYDRALAZINE HCL 25 MG PO TABS
25.0000 mg | ORAL_TABLET | Freq: Four times a day (QID) | ORAL | Status: DC
  Administered 2015-02-15 – 2015-02-16 (×7): 25 mg via ORAL
  Filled 2015-02-14 (×10): qty 1

## 2015-02-14 MED ORDER — ONDANSETRON HCL 4 MG PO TABS: 4.0000 mg | ORAL_TABLET | Freq: Four times a day (QID) | ORAL | Status: DC | PRN

## 2015-02-14 MED ORDER — DARBEPOETIN ALFA 200 MCG/0.4ML IJ SOSY
200.0000 ug | PREFILLED_SYRINGE | INTRAMUSCULAR | Status: DC
  Administered 2015-02-15: 200 ug via SUBCUTANEOUS
  Filled 2015-02-14: qty 0.4

## 2015-02-14 MED ORDER — TACROLIMUS 1 MG PO CAPS
11.0000 mg | ORAL_CAPSULE | Freq: Two times a day (BID) | ORAL | Status: DC
Start: 2015-02-15 — End: 2015-02-17
  Administered 2015-02-15 – 2015-02-17 (×5): 11 mg via ORAL
  Filled 2015-02-14 (×6): qty 11

## 2015-02-14 MED ORDER — FUROSEMIDE 10 MG/ML IJ SOLN
80.0000 mg | Freq: Four times a day (QID) | INTRAMUSCULAR | Status: DC
  Administered 2015-02-14 – 2015-02-16 (×7): 80 mg via INTRAVENOUS
  Filled 2015-02-14 (×13): qty 8

## 2015-02-14 MED ORDER — TRIAMCINOLONE ACETONIDE 55 MCG/ACT NA AERO
1.0000 | INHALATION_SPRAY | Freq: Every evening | NASAL | Status: DC | PRN
  Filled 2015-02-14: qty 10.8

## 2015-02-14 MED ORDER — ATENOLOL 100 MG PO TABS
100.0000 mg | ORAL_TABLET | Freq: Every day | ORAL | Status: DC
  Administered 2015-02-15 – 2015-02-17 (×3): 100 mg via ORAL
  Filled 2015-02-14 (×3): qty 1

## 2015-02-14 MED ORDER — PREDNISONE 20 MG PO TABS
40.0000 mg | ORAL_TABLET | Freq: Every day | ORAL | Status: DC
  Filled 2015-02-14: qty 2

## 2015-02-14 MED ORDER — AMLODIPINE BESYLATE 10 MG PO TABS
10.0000 mg | ORAL_TABLET | Freq: Every day | ORAL | Status: DC
  Administered 2015-02-15 – 2015-02-17 (×3): 10 mg via ORAL
  Filled 2015-02-14 (×3): qty 1

## 2015-02-14 MED ORDER — IPRATROPIUM-ALBUTEROL 0.5-2.5 (3) MG/3ML IN SOLN: 3.0000 mL | RESPIRATORY_TRACT | Status: DC | PRN

## 2015-02-14 MED ORDER — HYDRALAZINE HCL 20 MG/ML IJ SOLN: 10.0000 mg | INTRAMUSCULAR | Status: DC | PRN

## 2015-02-14 MED ORDER — ACETAMINOPHEN 325 MG PO TABS: 650.0000 mg | ORAL_TABLET | Freq: Four times a day (QID) | ORAL | Status: DC | PRN

## 2015-02-14 MED ORDER — ONDANSETRON HCL 4 MG/2ML IJ SOLN: 4.0000 mg | Freq: Four times a day (QID) | INTRAMUSCULAR | Status: DC | PRN

## 2015-02-14 MED ORDER — MYCOPHENOLATE MOFETIL 250 MG PO CAPS
750.0000 mg | ORAL_CAPSULE | Freq: Two times a day (BID) | ORAL | Status: DC
  Administered 2015-02-15 – 2015-02-17 (×5): 750 mg via ORAL
  Filled 2015-02-14 (×6): qty 3

## 2015-02-14 MED ORDER — SODIUM CHLORIDE 0.9 % IJ SOLN
3.0000 mL | Freq: Two times a day (BID) | INTRAMUSCULAR | Status: DC
  Administered 2015-02-15 – 2015-02-17 (×6): 3 mL via INTRAVENOUS

## 2015-02-14 MED ORDER — ACETAMINOPHEN 650 MG RE SUPP: 650.0000 mg | Freq: Four times a day (QID) | RECTAL | Status: DC | PRN

## 2015-02-14 NOTE — ED Provider Notes (Signed)
CSN: [id number]     Arrival date & time 02/14/15  1729 History   First MD Initiated Contact with Patient 02/14/15 1833     Chief Complaint  Patient presents with  . Shortness of Breath   (Consider location/radiation/quality/duration/timing/severity/associated sxs/prior Treatment) Patient is a 49 y.o. male presenting with shortness of breath. The history is provided by the patient and medical records. No language interpreter was used.  Shortness of Breath Severity:  Moderate Onset quality:  Gradual Timing:  Intermittent Progression:  Waxing and waning Chronicity:  New Context: activity   Relieved by:  Rest Worsened by:  Activity and exertion Ineffective treatments:  None tried Associated symptoms: cough   Associated symptoms: no abdominal pain, no chest pain, no diaphoresis, no fever, no headaches, no neck pain, no rash, no vomiting and no wheezing   Risk factors: obesity   Risk factors: no hx of PE/DVT, no prolonged immobilization and no recent surgery     Past Medical History  Diagnosis Date  . Hypertension   . Hyperlipidemia   . Chronic kidney disease   . OSA (obstructive sleep apnea) 03/27/2013  . Blood transfusion without reported diagnosis    Past Surgical History  Procedure Laterality Date  . Kidney transplant     Family History  Problem Relation Age of Onset  . Heart disease Mother   . Cancer Maternal Grandmother   . Cancer Maternal Grandfather   . Asthma Son   . Asthma Son    History  Substance Use Topics  . Smoking status: Never Smoker   . Smokeless tobacco: Never Used  . Alcohol Use: No    Review of Systems  Constitutional: Negative for fever, diaphoresis and fatigue.  Respiratory: Positive for cough and shortness of breath. Negative for chest tightness and wheezing.   Cardiovascular: Positive for leg swelling. Negative for chest pain and palpitations.  Gastrointestinal: Negative for nausea, vomiting, abdominal pain, diarrhea, constipation and blood in  stool.  Genitourinary: Positive for frequency. Negative for urgency, hematuria, flank pain and decreased urine volume.  Musculoskeletal: Negative for back pain and neck pain.  Skin: Negative for rash.  Neurological: Negative for light-headedness and headaches.  Psychiatric/Behavioral: Negative for confusion.  All other systems reviewed and are negative.     Allergies  Review of patient's allergies indicates no known allergies.  Home Medications   Prior to Admission medications   Medication Sig Start Date End Date Taking? Authorizing Provider  albuterol (PROVENTIL HFA;VENTOLIN HFA) 108 (90 BASE) MCG/ACT inhaler Inhale 2 puffs into the lungs every 6 (six) hours as needed for wheezing.    Historical Provider, MD  amLODipine (NORVASC) 10 MG tablet Take 10 mg by mouth daily.    Historical Provider, MD  atenolol (TENORMIN) 100 MG tablet Take 100 mg by mouth daily.    Historical Provider, MD  doxazosin (CARDURA) 8 MG tablet Take 8 mg by mouth at bedtime.    Historical Provider, MD  enalapril (VASOTEC) 10 MG tablet Take 10 mg by mouth daily.    Historical Provider, MD  mycophenolate (CELLCEPT) 250 MG capsule Take 750 mg by mouth 2 (two) times daily.    Historical Provider, MD  predniSONE (DELTASONE) 20 MG tablet 2 daily with food 03/06/14   Robyn Haber, MD  simvastatin (ZOCOR) 5 MG tablet Take 5 mg by mouth at bedtime.    Historical Provider, MD  tacrolimus (PROGRAF) 1 MG capsule Take 11 mg by mouth 2 (two) times daily.    Historical Provider, MD  BP 213/116 mmHg  Pulse 75  Temp(Src) 98.2 F (36.8 C) (Oral)  Resp 19  Ht 5' 10"  (1.778 m)  Wt 226 lb (102.513 kg)  BMI 32.43 kg/m2  SpO2 100%   Filed Vitals:   02/14/15 2145 02/14/15 2200 02/14/15 2230 02/14/15 2245  BP: 167/92 163/87 155/97 148/87  Pulse: 76 71 67 69  Temp:      TempSrc:      Resp: 16 23 13 13   Height:      Weight:      SpO2: 100% 100% 100% 100%    Physical Exam  Constitutional: He is oriented to person,  place, and time. He appears well-developed and well-nourished. He is cooperative. He does not have a sickly appearance. No distress.  HENT:  Head: Normocephalic and atraumatic.  Nose: Nose normal.  Mouth/Throat: Oropharynx is clear and moist. No oropharyngeal exudate.  Eyes: EOM are normal. Pupils are equal, round, and reactive to light.  Neck: Normal range of motion. Neck supple. No JVD present.  No JVD or hepatojugular reflex   Cardiovascular: Normal rate, regular rhythm, normal heart sounds and intact distal pulses.   No murmur heard. Pulmonary/Chest: Effort normal and breath sounds normal. No respiratory distress. He has no wheezes. He exhibits no tenderness.  Normal work of breathing, no retractions.  Lung sounds decreased 2/2 body habitus but symmetric.  No wheezing.  O2 stable on room air  Abdominal: Soft. He exhibits no distension. There is no tenderness. There is no guarding.  Musculoskeletal: Normal range of motion. He exhibits edema (1+ bilateral pitting edema at lower extremities). He exhibits no tenderness.  Neurological: He is alert and oriented to person, place, and time. No cranial nerve deficit. Coordination normal.  Skin: Skin is warm and dry. He is not diaphoretic. No pallor.  Psychiatric: He has a normal mood and affect. His behavior is normal. Judgment and thought content normal.  Nursing note and vitals reviewed.   ED Course  Procedures (including critical care time) Labs Review Labs Reviewed  CBC - Abnormal; Notable for the following:    RBC 3.00 (*)    Hemoglobin 7.7 (*)    HCT 24.7 (*)    MCH 25.7 (*)    RDW 16.7 (*)    All other components within normal limits  BASIC METABOLIC PANEL - Abnormal; Notable for the following:    Chloride 113 (*)    CO2 16 (*)    Glucose, Bld 104 (*)    BUN 66 (*)    Creatinine, Ser 12.19 (*)    Calcium 6.5 (*)    GFR calc non Af Amer 4 (*)    GFR calc Af Amer 5 (*)    All other components within normal limits  BRAIN  NATRIURETIC PEPTIDE - Abnormal; Notable for the following:    B Natriuretic Peptide 1305.9 (*)    All other components within normal limits  URINALYSIS, ROUTINE W REFLEX MICROSCOPIC - Abnormal; Notable for the following:    Glucose, UA 100 (*)    Hgb urine dipstick LARGE (*)    Protein, ur >300 (*)    All other components within normal limits  URINE MICROSCOPIC-ADD ON - Abnormal; Notable for the following:    Casts GRANULAR CAST (*)    All other components within normal limits  IRON AND TIBC  FERRITIN  PARATHYROID HORMONE, INTACT (NO CA)  RENAL FUNCTION PANEL  CBC  URINE RAPID DRUG SCREEN (HOSP PERFORMED)  I-STAT TROPOININ, ED    Imaging  Review Dg Chest 2 View  02/14/2015   CLINICAL DATA:  Short of breath for 1 week  EXAM: CHEST  2 VIEW  COMPARISON:  Radiograph 02/13/2005  FINDINGS: Stable enlarged cardiac silhouette. Chronic bronchitic markings centrally. No effusion, infiltrate, or pneumothorax. Trace pleural effusion noted on the lateral projection. This is similar to comparison exam. No acute osseous abnormality.  IMPRESSION: 1. Hyperinflated lungs and trace pleural effusion not changed from prior. 2. Chronic bronchitic markings 3. No changed from exam same day.   Electronically Signed   By: Suzy Bouchard M.D.   On: 02/14/2015 18:31   Dg Chest 2 View  02/14/2015   CLINICAL DATA:  Chest pain. Elevated blood pressure. Initial encounter.  EXAM: CHEST  2 VIEW  COMPARISON:  None.  FINDINGS: The heart size and mediastinal contours are normal. Increased markings at both lung bases may reflect atelectasis or scarring. There is no confluent airspace opacity, edema or significant pleural effusion. The bones appear unremarkable.  IMPRESSION: Mildly increased markings at both lung bases, probably reflecting atelectasis or scarring. Without prior examinations, acute inflammation cannot be completely excluded, although there is no consolidation.   Electronically Signed   By: Richardean Sale M.D.    On: 02/14/2015 17:06     EKG Interpretation   Date/Time:  Sunday Feb 14 2015 17:36:43 EDT Ventricular Rate:  74 PR Interval:  140 QRS Duration: 86 QT Interval:  420 QTC Calculation: 466 R Axis:   63 Text Interpretation:  Unusual P axis, possible ectopic atrial rhythm  Nonspecific T wave abnormality Prolonged QT Abnormal ECG No significant  change was found Confirmed by CAMPOS  MD, Lennette Bihari ([id number]) on 02/14/2015  7:16:57 PM      MDM   Final diagnoses:  AKI (acute kidney injury)  History of kidney transplant  Failure or rejection of organ transplant   Pt is a 49 yo M with hx of HTN, OSA, and right kidney transplant 2/2 glomerulonephritis, who presents with SOB and fluid overload sx.  Reports 2 weeks of SOB, intermittent DOE, lower extremity edema, and orthopnea.  Patient is fairly noncompliant with his CPAP machine at night, but states that it usually helps him sleep well whenever he uses it.  This week he has felt like he is drowning whenever he uses the machine.  Reports that occasionally he can hustle up the stairs without difficulty but other times has significant dyspnea on exertion with just a few steps.  Reports he occasionally misses his meds, and hadn't taken any of his medications from for the last 2 days.  Initially went to Urgent Care, who found him with systolic BP > 468.  He was encouraged to take him home BP meds (norvasc, atenolol, and cardura), which he did at approximately 1800 tonight, then presented to the ED.   Reports his lower extremity edema and SOB have relieved some since taking his home meds.    Is s/p right sided renal transplant in July 2001 with Dr. Alfonzo Feller at [facility].  Follows with Dr. Florene Glen for nephrology in [place].  Reportedly last saw Dr. Florene Glen in a clinic appointment in August where he was told his Cr had increased from 2 to 4, but still was overall ok.  Then had his labs drawn by the nephro clinic last week and reportedly hasn't heard the results yet.    Patient is nontoxic appearing.  Speaking in full sentences, O2 in upper 90s on room air, no increased work of breathing.  No JVD or hepatojugular  reflex.   Labs show severe acute renal failure, likely transplant failure Cr 12.19, GFR 5.  BNP 1305.  K 4.4 Patient and family informed of the results and voice understanding. Will call nephrology to discuss need for HD tonight.   BP has been trending down since he took his home meds prior to arrival.    Nephro on call, Dr. Moshe Cipro paged at [phone] at [place] to discuss patient.    Nephrology team requests admission for medical management, will follow the patient.  They placed several orders to try to further evaluate and treat his kidney failure.  Appreciate their assistance.  Patient's main doctor is his nephrologist, but no true PCP.  Paged his admission request out to unassigned and was triaged to Hospitalist team.  Spoke to Dr. Posey Pronto.  Will admit to telemetry floor.    Patient was seen with ED Attending, Dr. Driscilla Moats, MD    Tori Milks, MD 02/14/15 [place], MD 02/14/15 2326

## 2015-02-14 NOTE — Patient Instructions (Signed)
Your hemoglobin is very low (anemia).  Go directly to [facility] ER after our office.  If any change or worsening of symptoms on way there - call 911.

## 2015-02-14 NOTE — Progress Notes (Signed)
Called ED for report. Room ready for admission.

## 2015-02-14 NOTE — ED Notes (Signed)
Pt sent down from [facility] for SOB with exertion and feeling like he can't get a deep enough breath at times. Has sleep apnea and wears his CPAP at night. Feels like he has swelling to his bilateral lower extremities. Hasn't taken BP meds in a couple days because it made him feel like he was drowning.

## 2015-02-14 NOTE — Progress Notes (Addendum)
Subjective:    Patient ID: Jonathon Conley, male    DOB: Feb 24, 1966, 49 y.o.   MRN: [id number]  This chart was scribed for Merri Ray, MD by Stephania Fragmin, ED Scribe. This patient was seen in room 11 and the patient's care was started at 3:50 PM.   HPI   HPI Comments: Jonathon Conley is a 49 y.o. male.  Chart review: Patient has a history of CKD S/P kidney transplant, hypertension, and OSA. The kidney transplant occurred in 2001. Nephrologist is Dr. Florene Glen.   Patient presents to the [facility] today for constant DOE and a "gurgling" sensation that began 1 week ago - he first noticed it when he felt winded while going up the stairs. He endorses associated wheezing. He states he feels his heart race during deep inspiration and also during activity e.g. Walking to the car. Lying supine exacerbates his SOB, so he sleeps on 3-4 pillows. He denies a history of CHF. He denies a history of any MIs. He denies chest pain, although he adds that he will occasionally feel a sharp pain in his rib that has been intermittent for several years.   Patient states he has also been feeling fatigued. Patient has been going back to using his sleep apnea machine 2 weeks, for a couple hours or so per night. He was off of it for a period of time because it would wake him up in the middle of the night.   He last took his HTN medication 2 days ago, as he felt it was worsening his SOB. He has not been taking Norvasc, Atenolol, simvastatin, or Cardura. Patient's blood pressure usually hovered around 105/60 before he stopped his medication, but he has not checked his blood pressure since stopping his medication for the past couple of days.   Patient has also had leg swelling due to his kidney and was placed on furosemide. His creatinine has slowly increased - he was placed on furosemide since seeing a PA at Dr. Abel Presto office in the recent past. He has not taken any furosemide in the past 2 days. He states his  weight has not changed recently. His leg swelling has overall improved over the past few years.   Patient denies any fever or chest pain currently. Not dyspneic at rest.   Patient Active Problem List   Diagnosis Date Noted  . OSA (obstructive sleep apnea) 03/27/2013   Past Medical History  Diagnosis Date  . Hypertension   . Hyperlipidemia   . Chronic kidney disease   . OSA (obstructive sleep apnea) 03/27/2013  . Blood transfusion without reported diagnosis    Past Surgical History  Procedure Laterality Date  . Kidney transplant     No Known Allergies Prior to Admission medications   Medication Sig Start Date End Date Taking? Authorizing Provider  amLODipine (NORVASC) 10 MG tablet Take 10 mg by mouth daily.   Yes Historical Provider, MD  atenolol (TENORMIN) 100 MG tablet Take 100 mg by mouth daily.   Yes Historical Provider, MD  doxazosin (CARDURA) 8 MG tablet Take 8 mg by mouth at bedtime.   Yes Historical Provider, MD  enalapril (VASOTEC) 10 MG tablet Take 10 mg by mouth daily.   Yes Historical Provider, MD  mycophenolate (CELLCEPT) 250 MG capsule Take 750 mg by mouth 2 (two) times daily.   Yes Historical Provider, MD  predniSONE (DELTASONE) 20 MG tablet 2 daily with food 03/06/14  Yes Robyn Haber, MD  simvastatin [facility])  5 MG tablet Take 5 mg by mouth at bedtime.   Yes Historical Provider, MD  tacrolimus (PROGRAF) 1 MG capsule Take 11 mg by mouth 2 (two) times daily.   Yes Historical Provider, MD  albuterol (PROVENTIL HFA;VENTOLIN HFA) 108 (90 BASE) MCG/ACT inhaler Inhale 2 puffs into the lungs every 6 (six) hours as needed for wheezing.    Historical Provider, MD   History   Social History  . Marital Status: Single    Spouse Name: N/A  . Number of Children: N/A  . Years of Education: N/A   Occupational History  . Not on file.   Social History Main Topics  . Smoking status: Never Smoker   . Smokeless tobacco: Never Used  . Alcohol Use: No  . Drug Use: No  . Sexual  Activity: Not on file   Other Topics Concern  . Not on file   Social History Narrative      Review of Systems  Constitutional: Positive for fatigue. Negative for fever and unexpected weight change.  Eyes: Negative for visual disturbance.  Respiratory: Positive for shortness of breath and wheezing. Negative for cough and chest tightness.   Cardiovascular: Positive for leg swelling. Negative for chest pain and palpitations.  Gastrointestinal: Negative for abdominal pain and blood in stool.  Neurological: Negative for dizziness, light-headedness and headaches.       Objective:   Physical Exam  Constitutional: He is oriented to person, place, and time. He appears well-developed and well-nourished.  HENT:  Head: Normocephalic and atraumatic.  Right Ear: Tympanic membrane, external ear and ear canal normal.  Left Ear: Tympanic membrane, external ear and ear canal normal.  Nose: No rhinorrhea.  Mouth/Throat: Oropharynx is clear and moist and mucous membranes are normal. No oropharyngeal exudate or posterior oropharyngeal erythema.  Eyes: Conjunctivae and EOM are normal. Pupils are equal, round, and reactive to light.  Neck: Neck supple. No JVD present. Carotid bruit is not present.  Cardiovascular: Normal rate, regular rhythm, normal heart sounds and intact distal pulses.  Exam reveals no gallop and no friction rub.   No murmur heard. Pulmonary/Chest: Effort normal and breath sounds normal. He has no wheezes. He has no rhonchi. He has no rales.  Lungs are clear to auscultation.   Abdominal: Soft. There is no tenderness.  Musculoskeletal: He exhibits no edema.  1+ pitting edema on bilateral lower extremities to just below the knee.   Lymphadenopathy:    He has no cervical adenopathy.  Neurological: He is alert and oriented to person, place, and time.  Skin: Skin is warm and dry. No rash noted.  Psychiatric: He has a normal mood and affect. His behavior is normal.  Vitals  reviewed.  EKG: SR, nonspecific t waves.   Results for orders placed or performed in visit on 02/14/15  POCT CBC  Result Value Ref Range   WBC 4.2 (A) 4.6 - 10.2 K/uL   Lymph, poc 1.2 0.6 - 3.4   POC LYMPH PERCENT 27.8 10 - 50 %L   MID (cbc) 0.2 0 - 0.9   POC MID % 5.4 0 - 12 %M   POC Granulocyte 2.8 2 - 6.9   Granulocyte percent 66.8 37 - 80 %G   RBC 2.86 (A) 4.69 - 6.13 M/uL   Hemoglobin 7.4 (A) 14.1 - 18.1 g/dL   HCT, POC 23.9 (A) 43.5 - 53.7 %   MCV 80.9 80 - 97 fL   MCH, POC 25.0 (A) 27 - 31.2 pg  MCHC 31.0 (A) 31.8 - 35.4 g/dL   RDW, POC 19.1 %   Platelet Count, POC 229 142 - 424 K/uL   MPV 6.7 0 - 99.8 fL   [facility] reading (PRIMARY) by  Dr. Carlota Raspberry: CXR:  Few increased vascular markings.       Assessment & Plan:  Jonathon Conley is a 49 y.o. male Shortness of breath - Plan: Pro b natriuretic peptide, POCT CBC, EKG 12-Lead, DG Chest 2 View  Other fatigue - Plan: Basic metabolic panel, POCT CBC, EKG 12-Lead  Heart palpitations - Plan: EKG 12-Lead  OSA (obstructive sleep apnea)  Renal transplant, status post  Chronic kidney disease, unspecified stage - Plan: Basic metabolic panel  Over 1 week of fatigue, dyspnea on exertion, with underlying CKD, s/p renal transplant. Uncontrolled HTN off meds for past 2 days (fatigue on meds may have been combo of anemia and hypotension wih meds, but did not measure home BP's last week). Unknown baseline HGB, but as symptomatic at Hgb 7.4, possible CHF by symptoms of PND and unknown recent creatinine - will have evaluated further in ER. Some venous congestion suspected on CXR, but lungs clear on exam. Discussed EMS transport given low HGB and unknown baseline. He declined in favor of private vehicle transport by wife. Risks including but not limited to syncope discussed, understanding expressed. 911 precautions discussed.   -charge nurse advised at [facility]. approx 4:45pm.   No orders of the defined types were placed in this encounter.    Patient Instructions  Your hemoglobin is very low (anemia).  Go directly to [facility] ER after our office.  If any change or worsening of symptoms on way there - call 911.    I personally performed the services described in this documentation, which was scribed in my presence. The recorded information has been reviewed and considered, and addended by me as needed.

## 2015-02-14 NOTE — Consult Note (Signed)
[facility] Renal Consultation Note  Requesting MD: Venora Maples Indication for Consultation: AKI ? In transplant patient with volume overload and anemia  HPI:  Jonathon Conley is a 49 y.o. male with past medical history significant for malignant hypertension, OSA  as well as ESRD on PD and then hemodialysis before he was able to undergo a kidney transplant in 2001. Patient follows with Dr. Florene Glen in the office although I get the impression that is not the most compliant patient. He reports seeing Dr. Florene Glen back in August, come to find out it was August 2014. His creatinine was 2.8 at the time. He has not seen anyone since but tells me that his medications were up for renewal so he called the office. He reports having labs done at [facility] but never heard anything regarding those. I do not find those in our system. According to his wife he has not been feeling the greatest was tired and having shortness of breath on exertion. 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 However the last couple of days the shortness of breath got worse and he was noticing it at rest so came in for evaluation.  He has noted lower extremity edema but not extreme. He's noted no significant change in appetite and no vomiting. He feels his urine output is normal for him. Evaluation in the emergency department found a creatinine of 12 with a BUN of 66. Chest x-ray shows small pleural effusion may be increased interstitial markings. Hemoglobin is in the sevens.  The other thing he notes that he hasn't taken any medicines in the last 2 days although is unclear as to why. This is including his Prograf and CellCept and prednisone. He has not noted any tenderness to his kidney transplant allograft.   CREATININE, SER  Date/Time Value Ref Range Status  02/14/2015 05:55 PM 12.19* 0.61 - 1.24 mg/dL Final     PMHx:   Past Medical History  Diagnosis Date  . Hypertension   .  Hyperlipidemia   . Chronic kidney disease   . OSA (obstructive sleep apnea) 03/27/2013  . Blood transfusion without reported diagnosis     Past Surgical History  Procedure Laterality Date  . Kidney transplant      Family Hx:  Family History  Problem Relation Age of Onset  . Heart disease Mother   . Cancer Maternal Grandmother   . Cancer Maternal Grandfather   . Asthma Son   . Asthma Son     Social History:  reports that he has never smoked. He has never used smokeless tobacco. He reports that he does not drink alcohol or use illicit drugs.  Allergies: No Known Allergies  Medications: Prior to Admission medications   Medication Sig Start Date End Date Taking? Authorizing Provider  amLODipine (NORVASC) 10 MG tablet Take 10 mg by mouth daily.   Yes Historical Provider, MD  aspirin 81 MG tablet Take 81 mg by mouth daily.   Yes Historical Provider, MD  atenolol (TENORMIN) 100 MG tablet Take 100 mg by mouth daily.   Yes Historical Provider, MD  CALCIUM PO Take 1 tablet by mouth daily as needed (dark soda use).   Yes Historical Provider, MD  doxazosin (CARDURA) 8 MG tablet Take 8 mg by mouth at bedtime.   Yes Historical Provider, MD  enalapril (VASOTEC) 10 MG tablet Take 10 mg by mouth daily.   Yes Historical Provider, MD  mycophenolate (CELLCEPT) 250 MG  capsule Take 750 mg by mouth 2 (two) times daily.   Yes Historical Provider, MD  predniSONE (DELTASONE) 5 MG tablet Take 5 mg by mouth daily with breakfast.   Yes Historical Provider, MD  simvastatin (ZOCOR) 5 MG tablet Take 5 mg by mouth at bedtime.   Yes Historical Provider, MD  tacrolimus (PROGRAF) 1 MG capsule Take 11 mg by mouth 2 (two) times daily.   Yes Historical Provider, MD  triamcinolone (NASACORT) 55 MCG/ACT AERO nasal inhaler Place 1 spray into the nose at bedtime as needed (congestion).   Yes Historical Provider, MD  predniSONE (DELTASONE) 20 MG tablet 2 daily with food Patient not taking: Reported on 02/14/2015 03/06/14    Robyn Haber, MD    I have reviewed the patient's current medications.  Labs:  Results for orders placed or performed during the hospital encounter of 02/14/15 (from the past 48 hour(s))  CBC     Status: Abnormal   Collection Time: 02/14/15  5:55 PM  Result Value Ref Range   WBC 4.4 4.0 - 10.5 K/uL   RBC 3.00 (L) 4.22 - 5.81 MIL/uL   Hemoglobin 7.7 (L) 13.0 - 17.0 g/dL   HCT 24.7 (L) 39.0 - 52.0 %   MCV 82.3 78.0 - 100.0 fL   MCH 25.7 (L) 26.0 - 34.0 pg   MCHC 31.2 30.0 - 36.0 g/dL   RDW 16.7 (H) 11.5 - 15.5 %   Platelets 185 150 - 400 K/uL  Basic metabolic panel     Status: Abnormal   Collection Time: 02/14/15  5:55 PM  Result Value Ref Range   Sodium 142 135 - 145 mmol/L   Potassium 4.4 3.5 - 5.1 mmol/L   Chloride 113 (H) 101 - 111 mmol/L   CO2 16 (L) 22 - 32 mmol/L   Glucose, Bld 104 (H) 70 - 99 mg/dL   BUN 66 (H) 6 - 20 mg/dL   Creatinine, Ser 12.19 (H) 0.61 - 1.24 mg/dL   Calcium 6.5 (L) 8.9 - 10.3 mg/dL   GFR calc non Af Amer 4 (L) >60 mL/min   GFR calc Af Amer 5 (L) >60 mL/min    Comment: (NOTE) The eGFR has been calculated using the CKD EPI equation. This calculation has not been validated in all clinical situations. eGFR's persistently <90 mL/min signify possible Chronic Kidney Disease.    Anion gap 13 5 - 15  BNP (order ONLY if patient complains of dyspnea/SOB AND you have documented it for THIS visit)     Status: Abnormal   Collection Time: 02/14/15  5:55 PM  Result Value Ref Range   B Natriuretic Peptide 1305.9 (H) 0.0 - 100.0 pg/mL  I-stat troponin, ED  (not at [facility], [facility])     Status: None   Collection Time: 02/14/15  6:05 PM  Result Value Ref Range   Troponin i, poc 0.03 0.00 - 0.08 ng/mL   Comment 3            Comment: Due to the release kinetics of cTnI, a negative result within the first hours of the onset of symptoms does not rule out myocardial infarction with certainty. If myocardial infarction is still suspected, repeat the test at  appropriate intervals.      ROS:  A comprehensive review of systems was negative except for: Respiratory: positive for dyspnea on exertion and wheezing Cardiovascular: positive for lower extremity edema  Physical Exam: Filed Vitals:   02/14/15 2030  BP: 179/101  Pulse: 71  Temp:  Resp: 24     General: Well appearing black male.  HEENT: Pupils are equal round reactive to light, intraocular motions are intact, mucous members are moist Neck: There is no significant JVD Heart: Regular rate and rhythm Lungs: Actually sound mostly clear Abdomen: Obese, soft, nontender. Allograft is in the right lower quadrant and is also nontender Extremities: 1+ pitting edema to the knees Skin: Warm and dry Neuro: Alert and oriented and nonfocal  Assessment/Plan: 49 year old black male with history of kidney transplant in 2001 with chronic allograft nephropathy. He now presents nearly 2 years after his last office visit with elevated creatinine, anemia with shortness of breath 1.Renal- patient with a history of kidney transplant and chronic allograft nephropathy. Last known creatinine was close to 3. Creatinine now is 12.  I strongly suspect just progression of chronic allograft nephropathy and lack of follow-up with unclear compliance with antirejection medications. However, I will go ahead and check a renal ultrasound, U/A and attempt some high dose steroids to treat for possible rejection given that he has not taken his rejection medications last couple of days to see if we can achieve any improvement in his allograft function.  There are no immediate indications for dialysis at this time. However patient understands this does not look good 2. Hypertension/volume  - significant and malignant hypertension. Unclear if this is a result of the kidney failure or cause. I do not want him on an ACE inhibitor at this time. However will continue amlodipine and use high-dose IV diuretics as well as continue  Cardura and follow  3. Anemia  - this is significant. This could be giving him the symptom of dyspnea on exertion. I don't want to rush into a blood transfusion given that he likely would be a candidate for retransplant. We'll check iron stores - treat if needed and place him on high-dose ESA at this time 4. Metabolic acidosis- will treat with bicarbonate  Thank you for this consultation. We will continue to follow with you  Jarmon Javid A 02/14/2015, 8:55 PM

## 2015-02-14 NOTE — ED Notes (Signed)
Waiting for patient to arrive back from U/S to take to the floor.

## 2015-02-14 NOTE — H&P (Addendum)
[facility] History and Physical  Patient: Jonathon Conley  MRN: [id number]  DOB: [birth year]  DOS: the patient was seen and examined on 02/14/2015 PCP: Estanislado Emms, MD  Referring physician: Dr. Kai Levins. Venora Maples Chief Complaint: Shortness of breath  HPI: Jonathon Conley is a 49 y.o. male with Past medical history of obstructive sleep apnea on C Pap, renal transplant, malignant hypertension, asthma as a child. The patient is presenting with numbness of progressively worsening shortness of breath over last 1 week. He also has a cough with orthopnea and PND. He wears a seatbelt on regular basis. He complains of swelling in his legs. He denies any chest pain or chest tightness. Compressive nausea but no vomiting no diarrhea no constipation no abdominal pain. He does not have any burning urination or decrease or increase in urination. He thinks his weight is stable. He mentions he has not been taking his medications for blood pressure regularly and has been slowly tapering them off since last 6 months as when he tries to sleep after taking all the medications at the same time in the night he feels a sinking feeling with blackening out sensation. He denies any alcohol smoking abuse or drug abuse. He has not taken his Prograf today but mentions he is taking his CellCept and prednisone on a regular basis. Last saw his nephrologist/PCP in August.  The patient is coming from home. And at his baseline independent for most of his ADL.  Review of Systems: as mentioned in the history of present illness.  A comprehensive review of the other systems is negative.  Past Medical History  Diagnosis Date  . Hypertension   . Hyperlipidemia   . Chronic kidney disease   . OSA (obstructive sleep apnea) 03/27/2013  . Blood transfusion without reported diagnosis    Past Surgical History  Procedure Laterality Date  . Kidney transplant     Social History:  reports that he has never smoked. He has never  used smokeless tobacco. He reports that he does not drink alcohol or use illicit drugs.  No Known Allergies  Family History  Problem Relation Age of Onset  . Heart disease Mother   . Cancer Maternal Grandmother   . Cancer Maternal Grandfather   . Asthma Son   . Asthma Son     Prior to Admission medications   Medication Sig Start Date End Date Taking? Authorizing Provider  amLODipine (NORVASC) 10 MG tablet Take 10 mg by mouth daily.   Yes Historical Provider, MD  aspirin 81 MG tablet Take 81 mg by mouth daily.   Yes Historical Provider, MD  atenolol (TENORMIN) 100 MG tablet Take 100 mg by mouth daily.   Yes Historical Provider, MD  CALCIUM PO Take 1 tablet by mouth daily as needed (dark soda use).   Yes Historical Provider, MD  doxazosin (CARDURA) 8 MG tablet Take 8 mg by mouth at bedtime.   Yes Historical Provider, MD  enalapril (VASOTEC) 10 MG tablet Take 10 mg by mouth daily.   Yes Historical Provider, MD  mycophenolate (CELLCEPT) 250 MG capsule Take 750 mg by mouth 2 (two) times daily.   Yes Historical Provider, MD  predniSONE (DELTASONE) 5 MG tablet Take 5 mg by mouth daily with breakfast.   Yes Historical Provider, MD  simvastatin (ZOCOR) 5 MG tablet Take 5 mg by mouth at bedtime.   Yes Historical Provider, MD  tacrolimus (PROGRAF) 1 MG capsule Take 11 mg by mouth 2 (two) times daily.   Yes Historical  Provider, MD  triamcinolone (NASACORT) 55 MCG/ACT AERO nasal inhaler Place 1 spray into the nose at bedtime as needed (congestion).   Yes Historical Provider, MD  predniSONE (DELTASONE) 20 MG tablet 2 daily with food Patient not taking: Reported on 02/14/2015 03/06/14   Robyn Haber, MD    Physical Exam: Filed Vitals:   02/14/15 2245 02/14/15 2300 02/14/15 2315 02/14/15 2330  BP: 148/87 161/89 151/97 168/98  Pulse: 69 70 72 76  Temp:      TempSrc:      Resp: 13 13 15 14   Height:      Weight:      SpO2: 100% 100% 100% 100%    General: Alert, Awake and Oriented to Time,  Place and Person. Appear in mild distress Eyes: PERRL ENT: Oral Mucosa clear moisty. Neck: Mild JVD Cardiovascular: S1 and S2 Present, aortic systolic Murmur, Peripheral Pulses Present Respiratory: Bilateral Air entry equal and Decreased,  Faint basal Crackles, non wheezes Abdomen: Bowel Sound present, Soft and non tender Skin: no Rash Extremities: Bilateral Pedal edema, no calf tenderness Neurologic: Grossly no focal neuro deficit.  Labs on Admission:  CBC:  Recent Labs Lab 02/14/15 1618 02/14/15 1755  WBC 4.2* 4.4  HGB 7.4* 7.7*  HCT 23.9* 24.7*  MCV 80.9 82.3  PLT  --  185    CMP     Component Value Date/Time   NA 142 02/14/2015 1755   K 4.4 02/14/2015 1755   CL 113* 02/14/2015 1755   CO2 16* 02/14/2015 1755   GLUCOSE 104* 02/14/2015 1755   BUN 66* 02/14/2015 1755   CREATININE 12.19* 02/14/2015 1755   CALCIUM 6.5* 02/14/2015 1755   GFRNONAA 4* 02/14/2015 1755   GFRAA 5* 02/14/2015 1755    No results for input(s): LIPASE, AMYLASE in the last 168 hours.  No results for input(s): CKTOTAL, CKMB, CKMBINDEX, TROPONINI in the last 168 hours. BNP (last 3 results)  Recent Labs  02/14/15 1755  BNP 1305.9*    ProBNP (last 3 results) No results for input(s): PROBNP in the last 8760 hours.   Radiological Exams on Admission: Dg Chest 2 View  02/14/2015   CLINICAL DATA:  Short of breath for 1 week  EXAM: CHEST  2 VIEW  COMPARISON:  Radiograph 02/13/2005  FINDINGS: Stable enlarged cardiac silhouette. Chronic bronchitic markings centrally. No effusion, infiltrate, or pneumothorax. Trace pleural effusion noted on the lateral projection. This is similar to comparison exam. No acute osseous abnormality.  IMPRESSION: 1. Hyperinflated lungs and trace pleural effusion not changed from prior. 2. Chronic bronchitic markings 3. No changed from exam same day.   Electronically Signed   By: Suzy Bouchard M.D.   On: 02/14/2015 18:31   Dg Chest 2 View  02/14/2015   CLINICAL DATA:   Chest pain. Elevated blood pressure. Initial encounter.  EXAM: CHEST  2 VIEW  COMPARISON:  None.  FINDINGS: The heart size and mediastinal contours are normal. Increased markings at both lung bases may reflect atelectasis or scarring. There is no confluent airspace opacity, edema or significant pleural effusion. The bones appear unremarkable.  IMPRESSION: Mildly increased markings at both lung bases, probably reflecting atelectasis or scarring. Without prior examinations, acute inflammation cannot be completely excluded, although there is no consolidation.   Electronically Signed   By: Richardean Sale M.D.   On: 02/14/2015 17:06   EKG: Independently reviewed. normal sinus rhythm, nonspecific ST and T waves changes.  Assessment/Plan Principal Problem:   AKI (acute kidney injury) Active Problems:  OSA (obstructive sleep apnea)   Renal transplant recipient   Acute on chronic diastolic congestive heart failure   Anemia   1. AKI (acute kidney injury) The patient is presenting with complaints of shortness of breath and leg swelling with orthopnea and PND. Patient has been that he is compliant with his medications and has been following up with his nephrologist as recent as August. As per documentation from the nephrology it appears that the patient has seen his nephrologist in 2014 August and that is also question of compliance with his antirejection medications. He has been intubated or by the nephrologist. At this point the patient does not appear to have any acute need for hemodialysis. He will be monitored in the telemetry unit. I would continue his renal transplant medication and also I would check his Prograf levels. Continue prednisone as per nephrology. Monitor his ins and outs. The patient has received IV Lasix.  2. acute on chronic diastolic CHF possible area Echogram in the morning. Check TSH. EKG as well as troponins are negative for any acute abnormality. Echogram in the  morning.  3.anemia. Most likely associated with chronic kidney disease. Workup for anemia is on going. Transfuse as needed for H&H less than 7. Nephrology do not want to rush into blood transfusion as he would be a candidate for retransplantation.  4. history of asthma. Will use when necessary nebulizers as needed.  5. accelerated hypertension. Possibility malignant. Blood pressure at present has significantly improved. Most likely noncompliant with medications. At present I recommended that he should be taking his blood pressure medications at different hours during the day. Continue holding ACE inhibitor as well as alpha 1 BLOCKER. THE PATIENT MENTIONS THAT AFTER TAKING DOXAZOSIN HE HAS THE most sensation of blackening out. continuing other medications. Adding hydralazine when necessary and scheduled   6. Obstructive sleep apnea. Continue C Pap  Advance goals of care discussion: Full code    Consults: ED physician discussed with Dr. Moshe Cipro from nephrology.  DVT Prophylaxis: mechanical compression device  due to anemia   Nutrition: Renal diet  Family Communication: family was present at bedside, opportunity was given to ask question and all questions were answered satisfactorily at the time of interview. Disposition: Admitted as inpatient, telemetry unit.  Author: Berle Mull, MD [facility] Pager: [phone] 02/14/2015  If 7PM-7AM, please contact night-coverage [email or web address] Password [email or web address]

## 2015-02-15 ENCOUNTER — Encounter (HOSPITAL_COMMUNITY): Payer: Self-pay | Admitting: *Deleted

## 2015-02-15 LAB — RENAL FUNCTION PANEL
ALBUMIN: 2.3 g/dL — AB (ref 3.5–5.0)
Anion gap: 14 (ref 5–15)
BUN: 67 mg/dL — AB (ref 6–20)
CALCIUM: 6.4 mg/dL — AB (ref 8.9–10.3)
CHLORIDE: 112 mmol/L — AB (ref 101–111)
CO2: 16 mmol/L — ABNORMAL LOW (ref 22–32)
CREATININE: 11.42 mg/dL — AB (ref 0.61–1.24)
GFR calc non Af Amer: 5 mL/min — ABNORMAL LOW (ref >60)
GFR, EST AFRICAN AMERICAN: 5 mL/min — AB (ref >60)
GLUCOSE: 111 mg/dL — AB (ref 70–99)
PHOSPHORUS: 7.3 mg/dL — AB (ref 2.5–4.6)
Potassium: 4.1 mmol/L (ref 3.5–5.1)
SODIUM: 142 mmol/L (ref 135–145)

## 2015-02-15 LAB — COMPREHENSIVE METABOLIC PANEL
ALT: 14 U/L — ABNORMAL LOW (ref 17–63)
AST: 13 U/L — AB (ref 15–41)
Albumin: 2.3 g/dL — ABNORMAL LOW (ref 3.5–5.0)
Alkaline Phosphatase: 81 U/L (ref 38–126)
Anion gap: 11 (ref 5–15)
BUN: 66 mg/dL — ABNORMAL HIGH (ref 6–20)
CO2: 17 mmol/L — ABNORMAL LOW (ref 22–32)
Calcium: 6.4 mg/dL — CL (ref 8.9–10.3)
Chloride: 114 mmol/L — ABNORMAL HIGH (ref 101–111)
Creatinine, Ser: 11.71 mg/dL — ABNORMAL HIGH (ref 0.61–1.24)
GFR calc non Af Amer: 4 mL/min — ABNORMAL LOW (ref >60)
GFR, EST AFRICAN AMERICAN: 5 mL/min — AB (ref >60)
Glucose, Bld: 112 mg/dL — ABNORMAL HIGH (ref 70–99)
POTASSIUM: 4.1 mmol/L (ref 3.5–5.1)
SODIUM: 142 mmol/L (ref 135–145)
Total Bilirubin: 0.5 mg/dL (ref 0.3–1.2)
Total Protein: 5.3 g/dL — ABNORMAL LOW (ref 6.5–8.1)

## 2015-02-15 LAB — PHOSPHORUS: Phosphorus: 7.2 mg/dL — ABNORMAL HIGH (ref 2.5–4.6)

## 2015-02-15 LAB — BASIC METABOLIC PANEL
BUN: 65 mg/dL — AB (ref 6–23)
CALCIUM: 6.2 mg/dL — AB (ref 8.4–10.5)
CHLORIDE: 113 meq/L — AB (ref 96–112)
CO2: 17 mEq/L — ABNORMAL LOW (ref 19–32)
CREATININE: 10.76 mg/dL — AB (ref 0.50–1.35)
GLUCOSE: 92 mg/dL (ref 70–99)
Potassium: 4.5 mEq/L (ref 3.5–5.3)
Sodium: 144 mEq/L (ref 135–145)

## 2015-02-15 LAB — IRON AND TIBC
Iron: 26 ug/dL — ABNORMAL LOW (ref 45–182)
Saturation Ratios: 13 % — ABNORMAL LOW (ref 17.9–39.5)
TIBC: 199 ug/dL — AB (ref 250–450)
UIBC: 173 ug/dL

## 2015-02-15 LAB — ABO/RH: ABO/RH(D): A POS

## 2015-02-15 LAB — CBC
HCT: 21.2 % — ABNORMAL LOW (ref 39.0–52.0)
HEMOGLOBIN: 6.5 g/dL — AB (ref 13.0–17.0)
MCH: 25.6 pg — ABNORMAL LOW (ref 26.0–34.0)
MCHC: 30.7 g/dL (ref 30.0–36.0)
MCV: 83.5 fL (ref 78.0–100.0)
Platelets: 171 10*3/uL (ref 150–400)
RBC: 2.54 MIL/uL — ABNORMAL LOW (ref 4.22–5.81)
RDW: 16.7 % — AB (ref 11.5–15.5)
WBC: 3.9 10*3/uL — ABNORMAL LOW (ref 4.0–10.5)

## 2015-02-15 LAB — RETICULOCYTES
RBC.: 2.8 MIL/uL — AB (ref 4.22–5.81)
Retic Count, Absolute: 42 10*3/uL (ref 19.0–186.0)
Retic Ct Pct: 1.5 % (ref 0.4–3.1)

## 2015-02-15 LAB — PROTIME-INR
INR: 1.19 (ref 0.00–1.49)
Prothrombin Time: 15.3 seconds — ABNORMAL HIGH (ref 11.6–15.2)

## 2015-02-15 LAB — LACTATE DEHYDROGENASE: LDH: 326 U/L — AB (ref 98–192)

## 2015-02-15 LAB — FIBRINOGEN: Fibrinogen: 448 mg/dL (ref 204–475)

## 2015-02-15 LAB — TSH: TSH: 1.375 u[IU]/mL (ref 0.350–4.500)

## 2015-02-15 LAB — FERRITIN: FERRITIN: 33 ng/mL (ref 24–336)

## 2015-02-15 LAB — PREPARE RBC (CROSSMATCH)

## 2015-02-15 MED ORDER — SODIUM CHLORIDE 0.9 % IV SOLN
125.0000 mg | Freq: Every day | INTRAVENOUS | Status: DC
  Administered 2015-02-15 – 2015-02-16 (×2): 125 mg via INTRAVENOUS
  Filled 2015-02-15 (×4): qty 10

## 2015-02-15 MED ORDER — SODIUM CHLORIDE 0.9 % IV SOLN: Freq: Once | INTRAVENOUS | Status: DC

## 2015-02-15 MED ORDER — PNEUMOCOCCAL VAC POLYVALENT 25 MCG/0.5ML IJ INJ
0.5000 mL | INJECTION | INTRAMUSCULAR | Status: AC
  Administered 2015-02-16: 0.5 mL via INTRAMUSCULAR
  Filled 2015-02-15: qty 0.5

## 2015-02-15 MED ORDER — PREDNISONE 5 MG PO TABS
5.0000 mg | ORAL_TABLET | Freq: Every day | ORAL | Status: DC
  Administered 2015-02-15 – 2015-02-17 (×3): 5 mg via ORAL
  Filled 2015-02-15 (×5): qty 1

## 2015-02-15 MED ORDER — HYDRALAZINE HCL 20 MG/ML IJ SOLN: 10.0000 mg | INTRAMUSCULAR | Status: DC | PRN

## 2015-02-15 MED ORDER — CALCIUM ACETATE (PHOS BINDER) 667 MG PO CAPS
1334.0000 mg | ORAL_CAPSULE | Freq: Three times a day (TID) | ORAL | Status: DC
  Administered 2015-02-15 – 2015-02-17 (×6): 1334 mg via ORAL
  Filled 2015-02-15 (×8): qty 2

## 2015-02-15 NOTE — Progress Notes (Signed)
UR Completed.  [phone]  

## 2015-02-15 NOTE — Progress Notes (Signed)
[facility] Rounding Note Subjective:  Getting transfused at present No new complaints Denies SOB or pain at present Says appetite is OK  Objective Vital signs in last 24 hours: Filed Vitals:   02/15/15 0511 02/15/15 1153 02/15/15 1208 02/15/15 1510  BP: 141/80 175/98 171/95 135/77  Pulse: 72 70 70 74  Temp: 98.4 F (36.9 C) 98 F (36.7 C) 98.1 F (36.7 C) 98.1 F (36.7 C)  TempSrc: Oral Oral Oral Oral  Resp: 156 20 20 20   Height:      Weight:      SpO2: 99% 99% 100% 100%   Weight change:   Intake/Output Summary (Last 24 hours) at 02/15/15 1555 Last data filed at 02/15/15 1510  Gross per 24 hour  Intake    695 ml  Output    350 ml  Net    345 ml   Physical Exam:  BP 135/77 mmHg  Pulse 74  Temp(Src) 98.1 F (36.7 C) (Oral)  Resp 20  Ht 5' 10"  (1.778 m)  Wt 104.327 kg (230 lb)  BMI 33.00 kg/m2  SpO2 100%  General: Well appearing black male. Getting blood right now. Wife and mother in law in the room. Very pleasant. Remembers me from the early 2000's HEENT: [place]/AT Neck: There is no significant JVD Heart: Regular rate and rhythm No pericardial rub Lungs: Actually sound mostly clear Abdomen: Obese, soft, nontender. Allograft is in the right lower quadrant and is also nontender Extremities: 1+ pitting edema to the knees Skin: Warm and dry Neuro: Alert and oriented and nonfocal   Labs: Basic Metabolic Panel:  Recent Labs Lab 02/14/15 1612 02/14/15 1755 02/15/15 0436  NA 144 142 142  142  K 4.5 4.4 4.1  4.1  CL 113* 113* 114*  112*  CO2 17* 16* 17*  16*  GLUCOSE 92 104* 112*  111*  BUN 65* 66* 66*  67*  CREATININE 10.76* 12.19* 11.71*  11.42*  CALCIUM 6.2* 6.5* 6.4*  6.4*  PHOS  --   --  7.2*  7.3*   Liver Function Tests:  Recent Labs Lab 02/15/15 0436  AST 13*  ALT 14*  ALKPHOS 81  BILITOT 0.5  PROT 5.3*  ALBUMIN 2.3*  2.3*   Recent Labs Lab 02/14/15 1618 02/14/15 1755 02/15/15 0436  WBC 4.2* 4.4 3.9*  HGB  7.4* 7.7* 6.5*  HCT 23.9* 24.7* 21.2*  MCV 80.9 82.3 83.5  PLT  --  185 171     Iron Studies:  Recent Labs Lab 02/15/15 0436  IRON 26*  TIBC 199*  FERRITIN 33   Studies/Results: Dg Chest 2 View  02/14/2015   CLINICAL DATA:  Short of breath for 1 week  EXAM: CHEST  2 VIEW  COMPARISON:  Radiograph 02/13/2005  FINDINGS: Stable enlarged cardiac silhouette. Chronic bronchitic markings centrally. No effusion, infiltrate, or pneumothorax. Trace pleural effusion noted on the lateral projection. This is similar to comparison exam. No acute osseous abnormality.  IMPRESSION: 1. Hyperinflated lungs and trace pleural effusion not changed from prior. 2. Chronic bronchitic markings 3. No changed from exam same day.   Electronically Signed   By: Suzy Bouchard M.D.   On: 02/14/2015 18:31   Dg Chest 2 View  02/14/2015   CLINICAL DATA:  Chest pain. Elevated blood pressure. Initial encounter.  EXAM: CHEST  2 VIEW  COMPARISON:  None.  FINDINGS: The heart size and mediastinal contours are normal. Increased markings at both lung bases may reflect atelectasis or scarring. There is  no confluent airspace opacity, edema or significant pleural effusion. The bones appear unremarkable.  IMPRESSION: Mildly increased markings at both lung bases, probably reflecting atelectasis or scarring. Without prior examinations, acute inflammation cannot be completely excluded, although there is no consolidation.   Electronically Signed   By: Richardean Sale M.D.   On: 02/14/2015 17:06   US Renal Transplant W/doppler  02/15/2015   CLINICAL DATA:  2001 transplant.  Acute renal insufficiency.  EXAM: ULTRASOUND OF RENAL TRANSPLANT WITH DOPPLER  TECHNIQUE: Ultrasound examination of the renal transplant was performed with gray-scale, color and duplex doppler evaluation.  COMPARISON:  None.  FINDINGS: Transplant kidney location:  Right lower quadrant  Transplant kidney:  Length: 12.6 cm. Normal in size and parenchymal echogenicity. No  evidence of mass or hydronephrosis.  Color flow in the main renal artery:  Present  Color flow in the main renal vein:  Present  Duplex Doppler Evaluation  Resistive index in main renal artery: 0.76  Venous waveform in main renal vein:  Present  Resistive index in upper pole intrarenal artery: 0.58  Resistive index in lower pole intrarenal artery: 0.72  Bladder: Normal for degree of bladder distention.  Other findings:  None.  IMPRESSION: Unremarkable appearances of the transplant kidney.   Electronically Signed   By: Andreas Newport M.D.   On: 02/15/2015 01:17   Medications:   . sodium chloride   Intravenous Once  . amLODipine  10 mg Oral Daily  . atenolol  100 mg Oral Daily  . darbepoetin (ARANESP) injection - NON-DIALYSIS  200 mcg Subcutaneous Q Mon-1800  . furosemide  80 mg Intravenous Q6H  . hydrALAZINE  25 mg Oral 4 times per day  . mycophenolate  750 mg Oral BID  . [START ON 02/16/2015] pneumococcal 23 valent vaccine  0.5 mL Intramuscular Tomorrow-1000  . predniSONE  5 mg Oral Q breakfast  . sodium bicarbonate  650 mg Oral BID  . sodium chloride  3 mL Intravenous Q12H  . tacrolimus  11 mg Oral BID   Assessment/Plan:  49 year old black male with history of kidney transplant in 2001 with chronic allograft nephropathy. He now presents nearly 2 years after his last office visit with elevated creatinine, anemia with shortness of breath. He had Stage 4 CKD in 2014 - has not been seen either in the office or at [facility] in the past 2 years. We can locate no labs any more recent than 2014 (despite his statement that he had them done "a couple of weeks ago at [facility] - they don't have.  Given presentation with severe anemia, creatinine of 11, metabolic acidosis, hypocalcemia this is most likely new ESRD d/t failed transplant and usual ESRD complications.   1.Renal- patient with a history of kidney transplant and chronic allograft nephropathy. Last known creatinine 2014 was close to 3. Creatinine now  is 12. Strongly suspect just progression of chronic allograft nephropathy and lack of follow-up with unclear compliance with antirejection medications. Renal ultrasound shows no evidence for obstruction, urinalysis with proteinuria and red cells. Getting high dose steroids but doubt any recoverable function. No immediate dialysis indications but would be best served to have [facility] and AVF/AVG done as dialysis is imminent.  Will order vein mapping, [facility] has been called. Minimal response to IV lasix. Continuing transplant meds for right now. 2. Hypertension/volume - significant and malignant hypertension. Blood pressure coming down with meds.  Would avoid ACE at this time, even though this is likely ESRD now.  Continue amlodipine  and use high-dose IV diuretics as well as continue Cardura and follow  3. Anemia - this is significant. I suspect this (pluis his acidosis) caused him the symptom of dyspnea on exertion. Pt had transfusion ordered by primary service today for Hb of 6.5. He is quite iron deficient with a Tsat of 13 and I have ordered some IV Fe.  He is also on high dose ESA 4. Metabolic acidosis- will treat with bicarbonate until can initiate HD 5. CKD-MBD - check PTH (pending). Phos is 7.2 - started calcium acetate 2ac  Jonathon Maes, MD [facility] [phone] pager 02/15/2015, 3:55 PM

## 2015-02-15 NOTE — Progress Notes (Signed)
   Daily Progress Note  Chart reviewed.  Will see tomorrow.  If pt willing to proceed with access & TDC, OR space will be available Wednesday.   Adele Barthel, MD [facility] Office: [phone] Pager: [phone]  02/15/2015, 4:25 PM

## 2015-02-15 NOTE — ED Notes (Signed)
Arrived back.Taken to the floor.

## 2015-02-15 NOTE — Progress Notes (Signed)
Results for JUDEA, RICHES (MRN [id number]) as of 02/15/2015 06:27  Ref. Range 02/15/2015 04:36  Hemoglobin Latest Ref Range: 13.0-17.0 g/dL 6.5 (LL)   Results for ANTONIUS, HARTLAGE (MRN [id number]) as of 02/15/2015 06:27  Ref. Range 02/15/2015 04:36  Calcium Latest Ref Range: 8.9-10.3 mg/dL 6.4 (LL)  NP on call paged awaiting return call.

## 2015-02-15 NOTE — Progress Notes (Addendum)
PATIENT DETAILS Name: Jonathon Conley Age: 49 y.o. Sex: male Date of Birth: 1966/05/03 Admit Date: 02/14/2015 Admitting Physician Lavina Hamman, MD PZW:CHENID,POEUM C, MD  Subjective: SOB better-still has lower ext edema.   Assessment/Plan: Principal Problem:  Acute on CKD Stage 4: Etiology felt to be progression of chronic allograft nephropathy due to non compliance to immunosuppressives and lack up of follow up with nephrology. Renal consulted, recommendation are to resume immunosuppressives, and attempt diuresis with IV Lasix. Renal Ultrasound negative for acute abnormalities. Creatinine essentially unchanged. Follow lytes and further recommendations from Renal, if no improvement, suspect may require dialysis at some point.   Active Problems:   Anemia:suspect related to CKD, being transfused 1 unit of PRBC.  Defer Epo/ Iron to renal.Follow CBC    Metabolic Acidosis:secondary to CKD. Started on bicarb-follow lytes    PNT:IRWERXVQMG controlled, continue with Atenolol, Amlodipine and Hydralazine.Will not change regimen today-will follow and titrate accordingly.    Volume Overload:secondary to worsening renal function. Doubt CHF. Await Echo.    OSA (obstructive sleep apnea):continue CPAP  Disposition: Remain inpatient  Antimicrobial agents  See below  Anti-infectives    None      DVT Prophylaxis: Prophylactic Heparin  Code Status: Full code   Family Communication None  Procedures: None  CONSULTS:  nephrology  Time spent 40 minutes-which includes 50% of the time with face-to-face with patient/ family and coordinating care related to the above assessment and plan.  MEDICATIONS: Scheduled Meds: . sodium chloride   Intravenous Once  . amLODipine  10 mg Oral Daily  . atenolol  100 mg Oral Daily  . darbepoetin (ARANESP) injection - NON-DIALYSIS  200 mcg Subcutaneous Q Mon-1800  . furosemide  80 mg Intravenous Q6H  . hydrALAZINE  25 mg Oral 4 times  per day  . mycophenolate  750 mg Oral BID  . [START ON 02/16/2015] pneumococcal 23 valent vaccine  0.5 mL Intramuscular Tomorrow-1000  . predniSONE  5 mg Oral Q breakfast  . sodium bicarbonate  650 mg Oral BID  . sodium chloride  3 mL Intravenous Q12H  . tacrolimus  11 mg Oral BID   Continuous Infusions:  PRN Meds:.acetaminophen **OR** acetaminophen, hydrALAZINE, ipratropium-albuterol, ondansetron **OR** ondansetron (ZOFRAN) IV, triamcinolone    PHYSICAL EXAM: Conley signs in last 24 hours: Filed Vitals:   02/15/15 0119 02/15/15 0511 02/15/15 1153 02/15/15 1208  BP: 172/102 141/80 175/98 171/95  Pulse: 73 72 70 70  Temp: 98.1 F (36.7 C) 98.4 F (36.9 C) 98 F (36.7 C) 98.1 F (36.7 C)  TempSrc: Oral Oral Oral Oral  Resp: 16 156 20 20  Height: 5' 10"  (1.778 m)     Weight: 104.327 kg (230 lb)     SpO2: 100% 99% 99% 100%    Weight change:  Filed Weights   02/14/15 1740 02/15/15 0119  Weight: 102.513 kg (226 lb) 104.327 kg (230 lb)   Body mass index is 33 kg/(m^2).   Gen Exam: Awake and alert with clear speech.  Neck: Supple, No JVD.   Chest: B/L Clear.   CVS: S1 S2 Regular Abdomen: soft, BS +, non tender, non distended.  Extremities: 2-3+ edema, lower extremities warm to touch. Neurologic: Non Focal.   Skin: No Rash.   Wounds: N/A.   Intake/Output from previous day:  Intake/Output Summary (Last 24 hours) at 02/15/15 1431 Last data filed at 02/15/15 0600  Gross per 24 hour  Intake  360 ml  Output    350 ml  Net     10 ml     LAB RESULTS: CBC  Recent Labs Lab 02/14/15 1618 02/14/15 1755 02/15/15 0436  WBC 4.2* 4.4 3.9*  HGB 7.4* 7.7* 6.5*  HCT 23.9* 24.7* 21.2*  PLT  --  185 171  MCV 80.9 82.3 83.5  MCH 25.0* 25.7* 25.6*  MCHC 31.0* 31.2 30.7  RDW  --  16.7* 16.7*    Chemistries   Recent Labs Lab 02/14/15 1755 02/15/15 0436  NA 142 142  142  K 4.4 4.1  4.1  CL 113* 114*  112*  CO2 16* 17*  16*  GLUCOSE 104* 112*  111*  BUN 66*  66*  67*  CREATININE 12.19* 11.71*  11.42*  CALCIUM 6.5* 6.4*  6.4*    CBG: No results for input(s): GLUCAP in the last 168 hours.  GFR Estimated Creatinine Clearance: 9.6 mL/min (by C-G formula based on Cr of 11.42).  Coagulation profile  Recent Labs Lab 02/15/15 0436  INR 1.19    Cardiac Enzymes No results for input(s): CKMB, TROPONINI, MYOGLOBIN in the last 168 hours.  Invalid input(s): CK  Invalid input(s): POCBNP No results for input(s): DDIMER in the last 72 hours. No results for input(s): HGBA1C in the last 72 hours. No results for input(s): CHOL, HDL, LDLCALC, TRIG, CHOLHDL, LDLDIRECT in the last 72 hours.  Recent Labs  02/15/15 0436  TSH 1.375    Recent Labs  02/15/15 0436 02/15/15 0830  FERRITIN 33  --   TIBC 199*  --   IRON 26*  --   RETICCTPCT  --  1.5   No results for input(s): LIPASE, AMYLASE in the last 72 hours.  Urine Studies No results for input(s): UHGB, CRYS in the last 72 hours.  Invalid input(s): UACOL, UAPR, USPG, UPH, UTP, UGL, UKET, UBIL, UNIT, UROB, ULEU, UEPI, UWBC, URBC, UBAC, CAST, UCOM, BILUA  MICROBIOLOGY: No results found for this or any previous visit (from the past 240 hour(s)).  RADIOLOGY STUDIES/RESULTS: Dg Chest 2 View  02/14/2015   CLINICAL DATA:  Short of breath for 1 week  EXAM: CHEST  2 VIEW  COMPARISON:  Radiograph 02/13/2005  FINDINGS: Stable enlarged cardiac silhouette. Chronic bronchitic markings centrally. No effusion, infiltrate, or pneumothorax. Trace pleural effusion noted on the lateral projection. This is similar to comparison exam. No acute osseous abnormality.  IMPRESSION: 1. Hyperinflated lungs and trace pleural effusion not changed from prior. 2. Chronic bronchitic markings 3. No changed from exam same day.   Electronically Signed   By: Suzy Bouchard M.D.   On: 02/14/2015 18:31   Dg Chest 2 View  02/14/2015   CLINICAL DATA:  Chest pain. Elevated blood pressure. Initial encounter.  EXAM: CHEST  2 VIEW   COMPARISON:  None.  FINDINGS: The heart size and mediastinal contours are normal. Increased markings at both lung bases may reflect atelectasis or scarring. There is no confluent airspace opacity, edema or significant pleural effusion. The bones appear unremarkable.  IMPRESSION: Mildly increased markings at both lung bases, probably reflecting atelectasis or scarring. Without prior examinations, acute inflammation cannot be completely excluded, although there is no consolidation.   Electronically Signed   By: Richardean Sale M.D.   On: 02/14/2015 17:06   US Renal Transplant W/doppler  02/15/2015   CLINICAL DATA:  2001 transplant.  Acute renal insufficiency.  EXAM: ULTRASOUND OF RENAL TRANSPLANT WITH DOPPLER  TECHNIQUE: Ultrasound examination of the renal transplant was performed  with gray-scale, color and duplex doppler evaluation.  COMPARISON:  None.  FINDINGS: Transplant kidney location:  Right lower quadrant  Transplant kidney:  Length: 12.6 cm. Normal in size and parenchymal echogenicity. No evidence of mass or hydronephrosis.  Color flow in the main renal artery:  Present  Color flow in the main renal vein:  Present  Duplex Doppler Evaluation  Resistive index in main renal artery: 0.76  Venous waveform in main renal vein:  Present  Resistive index in upper pole intrarenal artery: 0.58  Resistive index in lower pole intrarenal artery: 0.72  Bladder: Normal for degree of bladder distention.  Other findings:  None.  IMPRESSION: Unremarkable appearances of the transplant kidney.   Electronically Signed   By: Andreas Newport M.D.   On: 02/15/2015 01:17    Oren Binet, MD  [facility] Pager:[phone]  If 7PM-7AM, please contact night-coverage [email or web address] Password [email or web address] 02/15/2015, 2:31 PM   LOS: 1 day

## 2015-02-15 NOTE — Progress Notes (Signed)
Pt states he can place himself on CPAP when ready. Pt aware to call Respiratory if he needs any help.

## 2015-02-16 ENCOUNTER — Inpatient Hospital Stay (HOSPITAL_COMMUNITY): Payer: PRIVATE HEALTH INSURANCE

## 2015-02-16 DIAGNOSIS — N189 Chronic kidney disease, unspecified: Secondary | ICD-10-CM

## 2015-02-16 DIAGNOSIS — D631 Anemia in chronic kidney disease: Secondary | ICD-10-CM

## 2015-02-16 DIAGNOSIS — N184 Chronic kidney disease, stage 4 (severe): Secondary | ICD-10-CM

## 2015-02-16 DIAGNOSIS — I509 Heart failure, unspecified: Secondary | ICD-10-CM

## 2015-02-16 DIAGNOSIS — Z940 Kidney transplant status: Secondary | ICD-10-CM

## 2015-02-16 DIAGNOSIS — N179 Acute kidney failure, unspecified: Principal | ICD-10-CM

## 2015-02-16 LAB — CBC
HCT: 25.7 % — ABNORMAL LOW (ref 39.0–52.0)
Hemoglobin: 8.1 g/dL — ABNORMAL LOW (ref 13.0–17.0)
MCH: 26 pg (ref 26.0–34.0)
MCHC: 31.5 g/dL (ref 30.0–36.0)
MCV: 82.4 fL (ref 78.0–100.0)
PLATELETS: 184 10*3/uL (ref 150–400)
RBC: 3.12 MIL/uL — ABNORMAL LOW (ref 4.22–5.81)
RDW: 16.4 % — ABNORMAL HIGH (ref 11.5–15.5)
WBC: 4.7 10*3/uL (ref 4.0–10.5)

## 2015-02-16 LAB — PARATHYROID HORMONE, INTACT (NO CA): PTH: 656 pg/mL — ABNORMAL HIGH (ref 15–65)

## 2015-02-16 LAB — RENAL FUNCTION PANEL
Albumin: 2.6 g/dL — ABNORMAL LOW (ref 3.5–5.0)
Anion gap: 16 — ABNORMAL HIGH (ref 5–15)
BUN: 69 mg/dL — ABNORMAL HIGH (ref 6–20)
CALCIUM: 6.7 mg/dL — AB (ref 8.9–10.3)
CO2: 15 mmol/L — ABNORMAL LOW (ref 22–32)
Chloride: 110 mmol/L (ref 101–111)
Creatinine, Ser: 11.76 mg/dL — ABNORMAL HIGH (ref 0.61–1.24)
GFR, EST AFRICAN AMERICAN: 5 mL/min — AB (ref >60)
GFR, EST NON AFRICAN AMERICAN: 4 mL/min — AB (ref >60)
Glucose, Bld: 86 mg/dL (ref 70–99)
PHOSPHORUS: 7.3 mg/dL — AB (ref 2.5–4.6)
POTASSIUM: 3.9 mmol/L (ref 3.5–5.1)
SODIUM: 141 mmol/L (ref 135–145)

## 2015-02-16 LAB — TYPE AND SCREEN
ABO/RH(D): A POS
Antibody Screen: NEGATIVE
Unit division: 0

## 2015-02-16 LAB — HAPTOGLOBIN: HAPTOGLOBIN: 167 mg/dL (ref 34–200)

## 2015-02-16 LAB — PRO B NATRIURETIC PEPTIDE: PRO B NATRI PEPTIDE: 22230 pg/mL — AB (ref <126)

## 2015-02-16 LAB — TACROLIMUS LEVEL: Tacrolimus (FK506) - LabCorp: NOT DETECTED ng/mL (ref 2.0–20.0)

## 2015-02-16 MED ORDER — CALCITRIOL 0.25 MCG PO CAPS
0.2500 ug | ORAL_CAPSULE | Freq: Every day | ORAL | Status: DC
  Administered 2015-02-16 – 2015-02-17 (×2): 0.25 ug via ORAL
  Filled 2015-02-16 (×2): qty 1

## 2015-02-16 MED ORDER — FUROSEMIDE 80 MG PO TABS
160.0000 mg | ORAL_TABLET | Freq: Two times a day (BID) | ORAL | Status: DC
  Administered 2015-02-16 – 2015-02-17 (×2): 160 mg via ORAL
  Filled 2015-02-16 (×4): qty 2

## 2015-02-16 MED ORDER — HYDRALAZINE HCL 50 MG PO TABS
50.0000 mg | ORAL_TABLET | Freq: Four times a day (QID) | ORAL | Status: DC
  Administered 2015-02-16 – 2015-02-17 (×4): 50 mg via ORAL
  Filled 2015-02-16 (×7): qty 1

## 2015-02-16 NOTE — Progress Notes (Addendum)
[facility] Rounding Note Subjective:  Getting transfused at present No new complaints Denies SOB or pain at present Says appetite is OK Having trouble coming to grips with going back on dialysis For vmapping today I HOPE he will agree to access but I get the sense he may not - LONG d/w patient and wife today  Objective Vital signs in last 24 hours: Filed Vitals:   02/15/15 1510 02/15/15 1852 02/15/15 2100 02/16/15 0535  BP: 135/77 140/75 145/72 170/97  Pulse: 74 69 69 74  Temp: 98.1 F (36.7 C) 98.5 F (36.9 C) 98 F (36.7 C) 97.8 F (36.6 C)  TempSrc: Oral Oral Oral Oral  Resp: 20 20 18 20   Height:      Weight:      SpO2: 100% 100% 100% 100%   Weight change:   Intake/Output Summary (Last 24 hours) at 02/16/15 0819 Last data filed at 02/16/15 0610  Gross per 24 hour  Intake    755 ml  Output   1551 ml  Net   -796 ml   Physical Exam:  BP 170/97 mmHg  Pulse 74  Temp(Src) 97.8 F (36.6 C) (Oral)  Resp 20  Ht 5' 10"  (1.778 m)  Wt 104.327 kg (230 lb)  BMI 33.00 kg/m2  SpO2 100%  General: Well appearing black male. Pleaseant, penxive HEENT: [place]/AT Neck: There is no significant JVD Heart: Regular rate and rhythm No pericardial rub Lungs: Actually sound mostly clear Abdomen: Obese, soft, nontender. Allograft is in the right lower quadrant and is also nontender Extremities: 1+ pitting edema to the knees Skin: Warm and dry Neuro: Alert and oriented and nonfocal No asterixus   Labs: Basic Metabolic Panel:  Renal function labs not drawn yet for today  Recent Labs Lab 02/14/15 1612 02/14/15 1755 02/15/15 0436  NA 144 142 142  142  K 4.5 4.4 4.1  4.1  CL 113* 113* 114*  112*  CO2 17* 16* 17*  16*  GLUCOSE 92 104* 112*  111*  BUN 65* 66* 66*  67*  CREATININE 10.76* 12.19* 11.71*  11.42*  CALCIUM 6.2* 6.5* 6.4*  6.4*  PHOS  --   --  7.2*  7.3*   Liver Function Tests:  Recent Labs Lab 02/15/15 0436  AST 13*  ALT 14*  ALKPHOS 81   BILITOT 0.5  PROT 5.3*  ALBUMIN 2.3*  2.3*    Recent Labs Lab 02/14/15 1618 02/14/15 1755 02/15/15 0436 02/16/15 0700  WBC 4.2* 4.4 3.9* 4.7  HGB 7.4* 7.7* 6.5* 8.1*  HCT 23.9* 24.7* 21.2* 25.7*  MCV 80.9 82.3 83.5 82.4  PLT  --  185 171 184     Recent Labs Lab 02/15/15 0436  IRON 26*  TIBC 199*  FERRITIN 33   Studies/Results: Dg Chest 2 View  02/14/2015   CLINICAL DATA:  Short of breath for 1 week  EXAM: CHEST  2 VIEW  COMPARISON:  Radiograph 02/13/2005  FINDINGS: Stable enlarged cardiac silhouette. Chronic bronchitic markings centrally. No effusion, infiltrate, or pneumothorax. Trace pleural effusion noted on the lateral projection. This is similar to comparison exam. No acute osseous abnormality.  IMPRESSION: 1. Hyperinflated lungs and trace pleural effusion not changed from prior. 2. Chronic bronchitic markings 3. No changed from exam same day.   Electronically Signed   By: Suzy Bouchard M.D.   On: 02/14/2015 18:31   Dg Chest 2 View  02/14/2015   CLINICAL DATA:  Chest pain. Elevated blood pressure. Initial encounter.  EXAM:  CHEST  2 VIEW  COMPARISON:  None.  FINDINGS: The heart size and mediastinal contours are normal. Increased markings at both lung bases may reflect atelectasis or scarring. There is no confluent airspace opacity, edema or significant pleural effusion. The bones appear unremarkable.  IMPRESSION: Mildly increased markings at both lung bases, probably reflecting atelectasis or scarring. Without prior examinations, acute inflammation cannot be completely excluded, although there is no consolidation.   Electronically Signed   By: Richardean Sale M.D.   On: 02/14/2015 17:06   US Renal Transplant W/doppler  02/15/2015   CLINICAL DATA:  2001 transplant.  Acute renal insufficiency.  EXAM: ULTRASOUND OF RENAL TRANSPLANT WITH DOPPLER  TECHNIQUE: Ultrasound examination of the renal transplant was performed with gray-scale, color and duplex doppler evaluation.   COMPARISON:  None.  FINDINGS: Transplant kidney location:  Right lower quadrant  Transplant kidney:  Length: 12.6 cm. Normal in size and parenchymal echogenicity. No evidence of mass or hydronephrosis.  Color flow in the main renal artery:  Present  Color flow in the main renal vein:  Present  Duplex Doppler Evaluation  Resistive index in main renal artery: 0.76  Venous waveform in main renal vein:  Present  Resistive index in upper pole intrarenal artery: 0.58  Resistive index in lower pole intrarenal artery: 0.72  Bladder: Normal for degree of bladder distention.  Other findings:  None.  IMPRESSION: Unremarkable appearances of the transplant kidney.   Electronically Signed   By: Andreas Newport M.D.   On: 02/15/2015 01:17   Medications:   . sodium chloride   Intravenous Once  . amLODipine  10 mg Oral Daily  . atenolol  100 mg Oral Daily  . calcium acetate  1,334 mg Oral TID WC  . darbepoetin (ARANESP) injection - NON-DIALYSIS  200 mcg Subcutaneous Q Mon-1800  . ferric gluconate (FERRLECIT/NULECIT) IV  125 mg Intravenous Daily  . furosemide  80 mg Intravenous Q6H  . hydrALAZINE  25 mg Oral 4 times per day  . mycophenolate  750 mg Oral BID  . pneumococcal 23 valent vaccine  0.5 mL Intramuscular Tomorrow-1000  . predniSONE  5 mg Oral Q breakfast  . sodium bicarbonate  650 mg Oral BID  . sodium chloride  3 mL Intravenous Q12H  . tacrolimus  11 mg Oral BID   Assessment/Plan:  49 year old black male with history of kidney transplant in 2001 with chronic allograft nephropathy. He now presents nearly 2 years after his last office visit with elevated creatinine, anemia with shortness of breath. He had Stage 4 CKD in 2014 - has not been seen either in the office or at [facility] in the past 2 years. We can locate no labs any more recent than 2014 (despite his statement that he had them done "a couple of weeks ago at [facility] - they don't have.  Given presentation with severe anemia, creatinine of 11,  metabolic acidosis, hypocalcemia this is most likely new ESRD d/t failed transplant and usual ESRD complications.   1.Renal- patient with a history of kidney transplant and chronic allograft nephropathy. Last known creatinine 2014 was close to 3. Creatinine now is 12. Strongly suspect just progression of chronic allograft nephropathy and lack of follow-up with unclear compliance with antirejection medications. Renal ultrasound shows no evidence for obstruction, urinalysis with proteinuria and red cells. Back on usual dose prednisone and still usual transplant meds but doubt any recoverable function. No immediate dialysis indications but would be best served to have [facility] and AVF/AVG  done as dialysis is imminent.  Vein mapping ordered and [facility] aware - I am not sure patient will agree to anything right now, Encouraged he and his wife to seriously discuss  2. Hypertension/volume - significant and malignant hypertension. Blood pressure coming down with meds.  Would avoid ACE at this time, even though this is likely ESRD now.  Continue amlodipine and use high-dose IV diuretics as well as continue Cardura and follow   3. Anemia - this is significant. I suspect this (pluis his acidosis) caused him the symptom of dyspnea on exertion. Pt had transfusion yesterday for Hb of 6.5 - up to 8.1 this AM and getting IV fe and Aranesp as well.    4. Metabolic acidosis- will treat with bicarbonate until can initiate HD  5. CKD-MBD - check PTH (still pending). Phos is 7.2 - started calcium acetate 2ac on [place], MD [facility] [phone] pager 02/16/2015, 8:19 AM  ADDENDUM:  I had Dr. Florene Glen speak with patient today about access since he was hesitant - he told Dr. Florene Glen he was agreeable to having it done, then told [facility] he "wasn't interested".  They have signed off.  There is not much else I can do here.  He is getting IV iron and we have started Aranesp 200 mcg once a week. He has been  transfused a unit of blood  PTH is 656 and I have just started calcitriol 0.25/day We started him on phoslo during this admission   He will be changed to po diuretics.  Dr. Florene Glen was notified of pt's response and plans to call and speak with him once more time. If Mr. Durio declines to have anything done, he can be discharged for outpatient followup with Dr. Florene Glen and further management of his anemia and other ESRD related issues in the outpt setting.  Jamal Maes, MD [facility] [phone] Pager 02/16/2015, 4:10 PM

## 2015-02-16 NOTE — Progress Notes (Signed)
RT Note: Pt places self on/off cpap.  RT will continue to monitor.

## 2015-02-16 NOTE — Consult Note (Signed)
[facility] [place]  Reason for consult: dialysis access and catheter placement Consulting physician: Dr. Lorrene Reid (nephrology)  History of Present Illness  Jonathon Conley is a 49 y.o. ([birth year]) male who presents for evaluation for permanent access and tunneled dialysis catheter placement. The patient has a history of kidney transplant (2001) and chronic allograft nephropathy. His primary nephrologist is Dr. Florene Glen.  He presented to the [facility] ED with numbness and progressive shortness of breath for one week. The patient is left hand dominant. He was previously on dialysis and has had a right radial-cephalic AV fistula, right lower arm and upper arm grafts which all failed. He then had a left thigh graft placed that also clotted. He denies any previous steal syndrome.  He never wanted access in his left arm to try to "protect it."  The patient has never had a PPM placed.   He has a past medical history of hypertension, not well controlled. He has hyperlipidemia managed on a statin. He has a history of OSA. He is still on anti-rejection medications.   He denies any CP or current SOB. Full ROS below.   Past Medical History  Diagnosis Date  . Hypertension   . Hyperlipidemia   . Chronic kidney disease   . OSA (obstructive sleep apnea) 03/27/2013  . Blood transfusion without reported diagnosis     Past Surgical History  Procedure Laterality Date  . Kidney transplant      History   Social History  . Marital Status: Single    Spouse Name: N/A  . Number of Children: N/A  . Years of Education: N/A   Occupational History  . Not on file.   Social History Main Topics  . Smoking status: Never Smoker   . Smokeless tobacco: Never Used  . Alcohol Use: No  . Drug Use: No  . Sexual Activity: Not on file   Other Topics Concern  . Not on file   Social History Narrative    Family History  Problem Relation Age of Onset  . Heart disease Mother   . Cancer  Maternal Grandmother   . Cancer Maternal Grandfather   . Asthma Son   . Asthma Son      No current facility-administered medications on file prior to encounter.   Current Outpatient Prescriptions on File Prior to Encounter  Medication Sig Dispense Refill  . amLODipine (NORVASC) 10 MG tablet Take 10 mg by mouth daily.    Marland Kitchen atenolol (TENORMIN) 100 MG tablet Take 100 mg by mouth daily.    Marland Kitchen doxazosin (CARDURA) 8 MG tablet Take 8 mg by mouth at bedtime.    . enalapril (VASOTEC) 10 MG tablet Take 10 mg by mouth daily.    . mycophenolate (CELLCEPT) 250 MG capsule Take 750 mg by mouth 2 (two) times daily.    . simvastatin (ZOCOR) 5 MG tablet Take 5 mg by mouth at bedtime.    . tacrolimus (PROGRAF) 1 MG capsule Take 11 mg by mouth 2 (two) times daily.    . predniSONE (DELTASONE) 20 MG tablet 2 daily with food (Patient not taking: Reported on 02/14/2015) 10 tablet 1    No Known Allergies  REVIEW OF SYSTEMS:  (Positives checked otherwise negative)  CARDIOVASCULAR:  []  chest pain, []  chest pressure, []  palpitations, []  shortness of breath when laying flat, [x shortness of breath with exertion,  []  pain in feet when walking, []  pain in feet when laying flat, []  history of blood clot in  veins (DVT), []  history of phlebitis, []  swelling in legs, []  varicose veins  PULMONARY:  []  productive cough, []  asthma, []  wheezing  NEUROLOGIC:  []  weakness in arms or legs, []  numbness in arms or legs, []  difficulty speaking or slurred speech, []  temporary loss of vision in one eye, []  dizziness  HEMATOLOGIC:  []  bleeding problems, []  problems with blood clotting too easily  MUSCULOSKEL:  []  joint pain, []  joint swelling  GASTROINTEST:  []  vomiting blood, []  blood in stool     GENITOURINARY:  []  burning with urination, []  blood in urine  PSYCHIATRIC:  []  history of major depression  INTEGUMENTARY:  []  rashes, []  ulcers  CONSTITUTIONAL:  []  fever, []  chills  Physical Examination  Filed Vitals:    02/15/15 1510 02/15/15 1852 02/15/15 2100 02/16/15 0535  BP: 135/77 140/75 145/72 170/97  Pulse: 74 69 69 74  Temp: 98.1 F (36.7 C) 98.5 F (36.9 C) 98 F (36.7 C) 97.8 F (36.6 C)  TempSrc: Oral Oral Oral Oral  Resp: 20 20 18 20   Height:      Weight:      SpO2: 100% 100% 100% 100%   Body mass index is 33 kg/(m^2).  General: A&O x 3, WD overweight male in NAD, is intermittently tearful during interview.   Head: [place]/AT  Neck: Supple  Pulmonary: Sym exp, good air movt, CTAB, no rales, rhonchi, & wheezing  Cardiac: RRR, Nl S1, S2, no Murmurs, rubs or gallops Vascular: Vessel Right Left  Radial Palpable Palpable  Ulnar Palpable Palpable  Brachial Palpable Palpable  Carotid Palpable, without bruit Palpable, without bruit  Aorta Not palpable N/A  Femoral Palpable Palpable  Popliteal Not palpable Not palpable  PT Not palpable Not plpable  DP Palpable Palpable   Gastrointestinal: soft, NTND, -G/R, - HSM, - masses  Musculoskeletal: M/S 5/5 throughout. Extremities without ischemic changes   Neurologic: CN 2-12 intact. Pain and light touch intact in extremities   Psychiatric: Judgment intact, Mood & affect appropriate for pt's clinical situation  Dermatologic: See M/S exam for extremity exam, no rashes otherwise noted  Laboratory: CBC:    Component Value Date/Time   WBC 4.7 02/16/2015 0700   WBC 4.2* 02/14/2015 1618   RBC 3.12* 02/16/2015 0700   RBC 2.80* 02/15/2015 0830   RBC 2.86* 02/14/2015 1618   HGB 8.1* 02/16/2015 0700   HGB 7.4* 02/14/2015 1618   HCT 25.7* 02/16/2015 0700   HCT 23.9* 02/14/2015 1618   PLT 184 02/16/2015 0700   MCV 82.4 02/16/2015 0700   MCV 80.9 02/14/2015 1618   MCH 26.0 02/16/2015 0700   MCH 25.0* 02/14/2015 1618   MCHC 31.5 02/16/2015 0700   MCHC 31.0* 02/14/2015 1618   RDW 16.4* 02/16/2015 0700    BMP:    Component Value Date/Time   NA 141 02/16/2015 0700   K 3.9 02/16/2015 0700   CL 110 02/16/2015 0700   CO2 15* 02/16/2015  0700   GLUCOSE 86 02/16/2015 0700   BUN 69* 02/16/2015 0700   CREATININE 11.76* 02/16/2015 0700   CREATININE 10.76* 02/14/2015 1612   CALCIUM 6.7* 02/16/2015 0700   GFRNONAA 4* 02/16/2015 0700   GFRAA 5* 02/16/2015 0700    Coagulation: Lab Results  Component Value Date   INR 1.19 02/15/2015   No results found for: PTT   Non-Invasive Vascular Imaging  Vein Mapping  (Date: 02/16/2015):  pending  Medical Decision Making  Albeiro Trompeter is a 49 y.o. male who presents with history of kidney  transplant in 2001 with chronic allograft nephropathy with no immediate dialysis indications but dialysis is likely imminent. He has a history of failed right upper and lower arm fistulas/grafts and left thigh graft.  The patient is left-handed and previously desired to "protect" his left arm. He is uncertain about having dialysis access and catheter at this time but is not completely against the idea. He is open to having access in his left arm now. Vein mapping is pending. Says he would like some additional time to think about it and will have an answer later today. Discussed that there is room in the OR schedule tomorrow for left arm access. Otherwise, the patient can follow-up as an outpatient with Dr. Bridgett Larsson.   Virgina Jock, PA-C [facility] Office: [phone] Pager: [phone]  02/16/2015, 10:07 AM  Addendum  Vita Barley pt is approaching ESRD again with likely imminent failure of his kidney transplant.  Pt will need TDC and access placement.  Will there is OR time available tomorrow.  Vein mapping not done and patient undecided.  Will check on patient tomorrow.  If he decides to proceed, keep NPO.   Adele Barthel, MD [facility] Office: [phone] Pager: [phone]  02/16/2015, 11:05 AM

## 2015-02-16 NOTE — Progress Notes (Signed)
  [facility] Progress Note   Spoke with patient again this afternoon. Says he had a discussion with his primary nephrologist Dr. Florene Glen today. The patient does not want to have any dialysis access or catheter placement at this time. Will sign off. Please call if needed.    Virgina Jock, PA-C [facility] Office: [phone] Pager: [phone] 02/16/2015 3:11 PM

## 2015-02-16 NOTE — Progress Notes (Signed)
PATIENT DETAILS Name: Jonathon Conley Age: 49 y.o. Sex: male Date of Birth: 1965-11-17 Admit Date: 02/14/2015 Admitting Physician Lavina Hamman, MD UYQ:[id number],VZDGL C, MD  Subjective: SOB same as yesterday-still has lower ext edema.   Assessment/Plan: Principal Problem:  Acute on CKD Stage 4-likely ESRD: Etiology felt to be progression of chronic allograft nephropathy due to non compliance to immunosuppressives and lack up of follow up with nephrology. Renal consulted, recommendation are to resume immunosuppressives, and attempt diuresis with IV Lasix. Renal Ultrasound negative for Hydronephroses. Creatinine essentially unchanged. Still with significant edema. Renal planning for HD-vascular surgery consulted for access.   Active Problems:   Hx of Renal Transplant:see above.     Anemia:suspect related to CKD, being transfused 1 unit of PRBC on 5/2-Hb stable at 8.1.  Defer Epo/ Iron to renal.Follow CBC    Metabolic Acidosis:secondary to CKD. Started on bicarb-follow lytes    OVF:IEPPIRJJOA controlled, continue with Atenolol, Amlodipine, will increase Hydralazine to 50 mg.Will follow and titrate accordingly.    Volume Overload:secondary to worsening renal function. Doubt CHF. Await Echo.    OSA (obstructive sleep apnea):continue CPAP  Disposition: Remain inpatient  Antimicrobial agents  See below  Anti-infectives    None      DVT Prophylaxis: Prophylactic Heparin  Code Status: Full code   Family Communication Spouse at bedside  Procedures: None  CONSULTS:  nephrology  Time spent 20 minutes-which includes 50% of the time with face-to-face with patient/ family and coordinating care related to the above assessment and plan.  MEDICATIONS: Scheduled Meds: . sodium chloride   Intravenous Once  . amLODipine  10 mg Oral Daily  . atenolol  100 mg Oral Daily  . calcium acetate  1,334 mg Oral TID WC  . darbepoetin (ARANESP) injection - NON-DIALYSIS  200  mcg Subcutaneous Q Mon-1800  . ferric gluconate (FERRLECIT/NULECIT) IV  125 mg Intravenous Daily  . furosemide  80 mg Intravenous Q6H  . hydrALAZINE  25 mg Oral 4 times per day  . mycophenolate  750 mg Oral BID  . predniSONE  5 mg Oral Q breakfast  . sodium bicarbonate  650 mg Oral BID  . sodium chloride  3 mL Intravenous Q12H  . tacrolimus  11 mg Oral BID   Continuous Infusions:  PRN Meds:.acetaminophen **OR** acetaminophen, hydrALAZINE, ipratropium-albuterol, ondansetron **OR** ondansetron (ZOFRAN) IV, triamcinolone    PHYSICAL EXAM: Vital signs in last 24 hours: Filed Vitals:   02/15/15 1852 02/15/15 2100 02/16/15 0535 02/16/15 1020  BP: 140/75 145/72 170/97 173/92  Pulse: 69 69 74 75  Temp: 98.5 F (36.9 C) 98 F (36.7 C) 97.8 F (36.6 C) 97.9 F (36.6 C)  TempSrc: Oral Oral Oral   Resp: 20 18 20    Height:      Weight:      SpO2: 100% 100% 100% 100%    Weight change:  Filed Weights   02/14/15 1740 02/15/15 0119  Weight: 102.513 kg (226 lb) 104.327 kg (230 lb)   Body mass index is 33 kg/(m^2).   Gen Exam: Awake and alert with clear speech. Not in any distress Neck: Supple, No JVD.   Chest: B/L Clear.   CVS: S1 S2 Regular Abdomen: soft, BS +, non tender, non distended.  Extremities: 2+ edema, lower extremities warm to touch. Neurologic: Non Focal.   Skin: No Rash.   Wounds: N/A.   Intake/Output from previous day:  Intake/Output Summary (Last 24  hours) at 02/16/15 1544 Last data filed at 02/16/15 0610  Gross per 24 hour  Intake    420 ml  Output   1551 ml  Net  -1131 ml     LAB RESULTS: CBC  Recent Labs Lab 02/14/15 1618 02/14/15 1755 02/15/15 0436 02/16/15 0700  WBC 4.2* 4.4 3.9* 4.7  HGB 7.4* 7.7* 6.5* 8.1*  HCT 23.9* 24.7* 21.2* 25.7*  PLT  --  185 171 184  MCV 80.9 82.3 83.5 82.4  MCH 25.0* 25.7* 25.6* 26.0  MCHC 31.0* 31.2 30.7 31.5  RDW  --  16.7* 16.7* 16.4*    Chemistries   Recent Labs Lab 02/14/15 1612 02/14/15 1755  02/15/15 0436 02/16/15 0700  NA 144 142 142  142 141  K 4.5 4.4 4.1  4.1 3.9  CL 113* 113* 114*  112* 110  CO2 17* 16* 17*  16* 15*  GLUCOSE 92 104* 112*  111* 86  BUN 65* 66* 66*  67* 69*  CREATININE 10.76* 12.19* 11.71*  11.42* 11.76*  CALCIUM 6.2* 6.5* 6.4*  6.4* 6.7*    CBG: No results for input(s): GLUCAP in the last 168 hours.  GFR Estimated Creatinine Clearance: 9.3 mL/min (by C-G formula based on Cr of 11.76).  Coagulation profile  Recent Labs Lab 02/15/15 0436  INR 1.19    Cardiac Enzymes No results for input(s): CKMB, TROPONINI, MYOGLOBIN in the last 168 hours.  Invalid input(s): CK  Invalid input(s): POCBNP No results for input(s): DDIMER in the last 72 hours. No results for input(s): HGBA1C in the last 72 hours. No results for input(s): CHOL, HDL, LDLCALC, TRIG, CHOLHDL, LDLDIRECT in the last 72 hours.  Recent Labs  02/15/15 0436  TSH 1.375    Recent Labs  02/15/15 0436 02/15/15 0830  FERRITIN 33  --   TIBC 199*  --   IRON 26*  --   RETICCTPCT  --  1.5   No results for input(s): LIPASE, AMYLASE in the last 72 hours.  Urine Studies No results for input(s): UHGB, CRYS in the last 72 hours.  Invalid input(s): UACOL, UAPR, USPG, UPH, UTP, UGL, UKET, UBIL, UNIT, UROB, ULEU, UEPI, UWBC, URBC, UBAC, CAST, UCOM, BILUA  MICROBIOLOGY: No results found for this or any previous visit (from the past 240 hour(s)).  RADIOLOGY STUDIES/RESULTS: Dg Chest 2 View  02/14/2015   CLINICAL DATA:  Short of breath for 1 week  EXAM: CHEST  2 VIEW  COMPARISON:  Radiograph 02/13/2005  FINDINGS: Stable enlarged cardiac silhouette. Chronic bronchitic markings centrally. No effusion, infiltrate, or pneumothorax. Trace pleural effusion noted on the lateral projection. This is similar to comparison exam. No acute osseous abnormality.  IMPRESSION: 1. Hyperinflated lungs and trace pleural effusion not changed from prior. 2. Chronic bronchitic markings 3. No changed from  exam same day.   Electronically Signed   By: Suzy Bouchard M.D.   On: 02/14/2015 18:31   Dg Chest 2 View  02/14/2015   CLINICAL DATA:  Chest pain. Elevated blood pressure. Initial encounter.  EXAM: CHEST  2 VIEW  COMPARISON:  None.  FINDINGS: The heart size and mediastinal contours are normal. Increased markings at both lung bases may reflect atelectasis or scarring. There is no confluent airspace opacity, edema or significant pleural effusion. The bones appear unremarkable.  IMPRESSION: Mildly increased markings at both lung bases, probably reflecting atelectasis or scarring. Without prior examinations, acute inflammation cannot be completely excluded, although there is no consolidation.   Electronically Signed   By:  Richardean Sale M.D.   On: 02/14/2015 17:06   US Renal Transplant W/doppler  02/15/2015   CLINICAL DATA:  2001 transplant.  Acute renal insufficiency.  EXAM: ULTRASOUND OF RENAL TRANSPLANT WITH DOPPLER  TECHNIQUE: Ultrasound examination of the renal transplant was performed with gray-scale, color and duplex doppler evaluation.  COMPARISON:  None.  FINDINGS: Transplant kidney location:  Right lower quadrant  Transplant kidney:  Length: 12.6 cm. Normal in size and parenchymal echogenicity. No evidence of mass or hydronephrosis.  Color flow in the main renal artery:  Present  Color flow in the main renal vein:  Present  Duplex Doppler Evaluation  Resistive index in main renal artery: 0.76  Venous waveform in main renal vein:  Present  Resistive index in upper pole intrarenal artery: 0.58  Resistive index in lower pole intrarenal artery: 0.72  Bladder: Normal for degree of bladder distention.  Other findings:  None.  IMPRESSION: Unremarkable appearances of the transplant kidney.   Electronically Signed   By: Andreas Newport M.D.   On: 02/15/2015 01:17    Oren Binet, MD  [facility] Pager:[phone]  If 7PM-7AM, please contact night-coverage [email or web address] Password  [email or web address] 02/16/2015, 3:44 PM   LOS: 2 days

## 2015-02-16 NOTE — Progress Notes (Addendum)
VASCULAR LAB PRELIMINARY  PRELIMINARY  PRELIMINARY  PRELIMINARY  Right  Upper Extremity Vein Map    Cephalic  Segment Diameter Depth Comment  1. Axilla 2.1 mm mm   2. Mid upper arm 2.1 mm mm   3. Above AC 2.1 mm mm   4. In AC mm mm   5. Below AC mm mm Tortuous, thrombus  6. Mid forearm mm mm thrombus  7. Wrist 2.1 mm mm thrombus   mm mm    mm mm    mm mm    Basilic  Segment Diameter Depth Comment  1. Axilla mm mm Not visualized  2. Mid upper arm mm mm Not visualized  3. Above AC mm mm Not visualized  4. In AC mm mm Not visualized  5. Below AC mm mm Not visualized   mm mm    mm mm    mm mm    mm mm    mm mm     Left Upper Extremity Vein Map    Cephalic  Segment Diameter Depth Comment  1. Axilla mm mm Not visualized  2. Mid upper arm mm mm Not visualized  3. Above AC mm mm Tortuous, too small to measure  4. In AC mm mm Tortuous, too small to measure  5. Below AC 1.0 mm mm Tortuous, above IV  6. Mid forearm 1.4 mm mm IV  7. Wrist 1.0 mm mm    mm mm    mm mm    mm mm    Basilic  Segment Diameter Depth Comment  1. Axilla 3.2 mm 11.1 mm   2. Mid upper arm 2.1 mm 10.8 mm branch  3. Above AC 2.1 mm 8.0 mm branch  4. In AC 2.1 mm 4.5 mm branch  5. Below AC mm mm Not visualized   mm mm    mm mm    mm mm    mm mm    mm mm     Jonathon Conley, RVT 02/16/2015, 2:37 PM

## 2015-02-17 DIAGNOSIS — G4733 Obstructive sleep apnea (adult) (pediatric): Secondary | ICD-10-CM

## 2015-02-17 LAB — RENAL FUNCTION PANEL
ANION GAP: 16 — AB (ref 5–15)
Albumin: 2.5 g/dL — ABNORMAL LOW (ref 3.5–5.0)
BUN: 71 mg/dL — AB (ref 6–20)
CHLORIDE: 107 mmol/L (ref 101–111)
CO2: 16 mmol/L — AB (ref 22–32)
Calcium: 6.7 mg/dL — ABNORMAL LOW (ref 8.9–10.3)
Creatinine, Ser: 12.15 mg/dL — ABNORMAL HIGH (ref 0.61–1.24)
GFR calc non Af Amer: 4 mL/min — ABNORMAL LOW (ref >60)
GFR, EST AFRICAN AMERICAN: 5 mL/min — AB (ref >60)
GLUCOSE: 85 mg/dL (ref 70–99)
POTASSIUM: 3.7 mmol/L (ref 3.5–5.1)
Phosphorus: 7 mg/dL — ABNORMAL HIGH (ref 2.5–4.6)
SODIUM: 139 mmol/L (ref 135–145)

## 2015-02-17 LAB — TACROLIMUS LEVEL: Tacrolimus (FK506) - LabCorp: 6.2 ng/mL (ref 2.0–20.0)

## 2015-02-17 LAB — CBC
HCT: 25.4 % — ABNORMAL LOW (ref 39.0–52.0)
Hemoglobin: 8.2 g/dL — ABNORMAL LOW (ref 13.0–17.0)
MCH: 26.3 pg (ref 26.0–34.0)
MCHC: 32.3 g/dL (ref 30.0–36.0)
MCV: 81.4 fL (ref 78.0–100.0)
Platelets: 192 10*3/uL (ref 150–400)
RBC: 3.12 MIL/uL — ABNORMAL LOW (ref 4.22–5.81)
RDW: 16.6 % — ABNORMAL HIGH (ref 11.5–15.5)
WBC: 5.9 10*3/uL (ref 4.0–10.5)

## 2015-02-17 MED ORDER — SIMVASTATIN 5 MG PO TABS
5.0000 mg | ORAL_TABLET | Freq: Every day | ORAL | Status: DC
Start: 2015-02-17 — End: 2018-05-04

## 2015-02-17 MED ORDER — CALCIUM ACETATE (PHOS BINDER) 667 MG PO CAPS: 1334.0000 mg | ORAL_CAPSULE | Freq: Three times a day (TID) | ORAL | Status: DC

## 2015-02-17 MED ORDER — HYDRALAZINE HCL 50 MG PO TABS: 50.0000 mg | ORAL_TABLET | Freq: Four times a day (QID) | ORAL | Status: DC

## 2015-02-17 MED ORDER — MYCOPHENOLATE MOFETIL 250 MG PO CAPS: 750.0000 mg | ORAL_CAPSULE | Freq: Two times a day (BID) | ORAL | Status: DC

## 2015-02-17 MED ORDER — FERROUS SULFATE 325 (65 FE) MG PO TABS: 325.0000 mg | ORAL_TABLET | Freq: Two times a day (BID) | ORAL | Status: DC

## 2015-02-17 MED ORDER — SODIUM BICARBONATE 650 MG PO TABS: 650.0000 mg | ORAL_TABLET | Freq: Two times a day (BID) | ORAL | Status: DC

## 2015-02-17 MED ORDER — TACROLIMUS 1 MG PO CAPS: 11.0000 mg | ORAL_CAPSULE | Freq: Two times a day (BID) | ORAL | Status: DC

## 2015-02-17 MED ORDER — FUROSEMIDE 80 MG PO TABS: 160.0000 mg | ORAL_TABLET | Freq: Two times a day (BID) | ORAL | Status: DC

## 2015-02-17 MED ORDER — FERROUS SULFATE 325 (65 FE) MG PO TABS
325.0000 mg | ORAL_TABLET | Freq: Two times a day (BID) | ORAL | Status: DC
  Administered 2015-02-17: 325 mg via ORAL
  Filled 2015-02-17 (×3): qty 1

## 2015-02-17 MED ORDER — CALCITRIOL 0.25 MCG PO CAPS: 0.2500 ug | ORAL_CAPSULE | Freq: Every day | ORAL | Status: DC

## 2015-02-17 MED ORDER — PREDNISONE 5 MG PO TABS: 5.0000 mg | ORAL_TABLET | Freq: Every day | ORAL | Status: DC

## 2015-02-17 NOTE — Progress Notes (Signed)
[facility] Rounding Note Subjective:  Refuses to get any access done despite discussion with myself and with Dr. Lawanna Kobus he will f/u in the office for further discussions  Objective Vital signs in last 24 hours: Filed Vitals:   02/16/15 1020 02/16/15 1643 02/16/15 2055 02/17/15 0539  BP: 173/92 157/93 141/79 145/82  Pulse: 75 74 75 77  Temp: 97.9 F (36.6 C) 98.3 F (36.8 C) 98.2 F (36.8 C) 97.9 F (36.6 C)  TempSrc:  Oral Oral Oral  Resp:  18 18 17   Height:   5' 10"  (1.778 m)   Weight:   103.738 kg (228 lb 11.2 oz)   SpO2: 100% 100% 100% 100%   Weight change:   Intake/Output Summary (Last 24 hours) at 02/17/15 0815 Last data filed at 02/17/15 0600  Gross per 24 hour  Intake    480 ml  Output   1325 ml  Net   -845 ml   Physical Exam:  BP 145/82 mmHg  Pulse 77  Temp(Src) 97.9 F (36.6 C) (Oral)  Resp 17  Ht 5' 10"  (1.778 m)  Wt 103.738 kg (228 lb 11.2 oz)  BMI 32.82 kg/m2  SpO2 100%  General: Well appearing black male. Pleaseant Neck: There is no significant JVD Heart: Regular rate and rhythm No pericardial rub Lungs: Actually sound mostly clear Abdomen: Obese, soft, nontender. Allograft is in the right lower quadrant and is also nontender Extremities: 1+ pitting edema to the knees Skin: Warm and dry Neuro: Alert and oriented and nonfocal No asterixus  Labs: Basic Metabolic Panel:   Recent Labs Lab 02/14/15 1612 02/14/15 1755 02/15/15 0436 02/16/15 0700 02/17/15 0508  NA 144 142 142  142 141 139  K 4.5 4.4 4.1  4.1 3.9 3.7  CL 113* 113* 114*  112* 110 107  CO2 17* 16* 17*  16* 15* 16*  GLUCOSE 92 104* 112*  111* 86 85  BUN 65* 66* 66*  67* 69* 71*  CREATININE 10.76* 12.19* 11.71*  11.42* 11.76* 12.15*  CALCIUM 6.2* 6.5* 6.4*  6.4* 6.7* 6.7*  PHOS  --   --  7.2*  7.3* 7.3* 7.0*   Liver Function Tests:  Recent Labs Lab 02/15/15 0436 02/16/15 0700 02/17/15 0508  AST 13*  --   --   ALT 14*  --   --   ALKPHOS 81   --   --   BILITOT 0.5  --   --   PROT 5.3*  --   --   ALBUMIN 2.3*  2.3* 2.6* 2.5*    Recent Labs Lab 02/14/15 1755 02/15/15 0436 02/16/15 0700 02/17/15 0508  WBC 4.4 3.9* 4.7 5.9  HGB 7.7* 6.5* 8.1* 8.2*  HCT 24.7* 21.2* 25.7* 25.4*  MCV 82.3 83.5 82.4 81.4  PLT 185 171 184 192     Recent Labs Lab 02/15/15 0436  IRON 26*  TIBC 199*  FERRITIN 33   Studies/Results: No results found. Medications:   . sodium chloride   Intravenous Once  . amLODipine  10 mg Oral Daily  . atenolol  100 mg Oral Daily  . calcitRIOL  0.25 mcg Oral Daily  . calcium acetate  1,334 mg Oral TID WC  . darbepoetin (ARANESP) injection - NON-DIALYSIS  200 mcg Subcutaneous Q Mon-1800  . ferric gluconate (FERRLECIT/NULECIT) IV  125 mg Intravenous Daily  . furosemide  160 mg Oral BID  . hydrALAZINE  50 mg Oral 4 times per day  . mycophenolate  750 mg Oral  BID  . predniSONE  5 mg Oral Q breakfast  . sodium bicarbonate  650 mg Oral BID  . sodium chloride  3 mL Intravenous Q12H  . tacrolimus  11 mg Oral BID   Assessment/Plan:  49 year old black male with history of kidney transplant in 2001 with chronic allograft nephropathy. He now presents nearly 2 years after his last office visit with elevated creatinine, anemia with shortness of breath. He had Stage 4 CKD in 2014 - has not been seen either in the office or at [facility] in the past 2 years. We can locate no labs any more recent than 2014 (despite his statement that he had them done "a couple of weeks ago at [facility] - they don't have.  Given presentation with severe anemia, creatinine of 11, metabolic acidosis, hypocalcemia this is most likely new ESRD d/t failed transplant and usual ESRD complications.   1.Renal- patient with a history of kidney transplant and chronic allograft nephropathy. Last known creatinine 2014 was close to 3. Creatinine now is 12. Strongly suspect just progression of chronic allograft nephropathy and lack of follow-up with unclear  compliance with antirejection medications. Renal ultrasound shows no evidence for obstruction, urinalysis with proteinuria and red cells. Back on usual dose prednisone and still usual transplant meds but doubt any recoverable function. No immediate dialysis indications but would be best served to have [facility] and AVF/AVG done as dialysis is imminent.  He adamantly refuses access at this time. States will f/u in the office with Dr. Florene Glen. An appointment has been made for Monday. He has been switched to po lasix - see orders. Keep him on the same prograf (none detected on level so cerifies was not taking), prednisone and mycophenolate  2. Hypertension/volume - significant and malignant hypertension. Blood pressure coming down with meds.  Would avoid ACE at this time, even though this is likely ESRD now.  Continue current meds + po lasix  3. Anemia - this is significant. I suspect this (pluis his acidosis) caused him the symptom of dyspnea on exertion. Pt had transfusion  for Hb of 6.5 - up to 8.1 this AM and has been getting IV fe and Aranesp as well.  Further anemia management will take place as outpt. Pt will need to be placed on po Fe - ordered  4. Metabolic acidosis- Continue sodium bicarbonate since pt refuses dialysis  5. CKD-MBD - PTH was 656. Added daily calcitriol 0.25 mcg. He should be sent out on this + phoslo 667 2 tid with each meal.  Since he declines access/HD all meds changed to po as per the note above, has Monday appointment with Dr. Florene Glen for further discussions, and from my standpoint could be sent home.  Jamal Maes, MD [facility] [phone] pager 02/17/2015, 8:15 AM

## 2015-02-17 NOTE — Discharge Summary (Signed)
PATIENT DETAILS Name: Jonathon Conley Age: 49 y.o. Sex: male Date of Birth: 1966-04-06 MRN: [id number]. Admitting Physician: Lavina Hamman, MD GQB:[id number],TUUEK C, MD  Admit Date: 02/14/2015 Discharge date: 02/17/2015  Recommendations for Outpatient Follow-up:  1. Ensure follow-up with nephrology on 5/9 2. Suspect will need referral to vascular surgery for hemodialysis access 3. Please check CBC and chemistries at next follow-up with nephrology   PRIMARY DISCHARGE DIAGNOSIS:  Principal Problem:   AKI (acute kidney injury) Active Problems:   OSA (obstructive sleep apnea)   Renal transplant recipient   Acute on chronic diastolic congestive heart failure   Anemia      PAST MEDICAL HISTORY: Past Medical History  Diagnosis Date  . Hypertension   . Hyperlipidemia   . Chronic kidney disease   . OSA (obstructive sleep apnea) 03/27/2013  . Blood transfusion without reported diagnosis     DISCHARGE MEDICATIONS: Current Discharge Medication List    START taking these medications   Details  calcitRIOL (ROCALTROL) 0.25 MCG capsule Take 1 capsule (0.25 mcg total) by mouth daily. Qty: 30 capsule, Refills: 0    calcium acetate (PHOSLO) 667 MG capsule Take 2 capsules (1,334 mg total) by mouth 3 (three) times daily with meals. Qty: 200 capsule, Refills: 0    ferrous sulfate 325 (65 FE) MG tablet Take 1 tablet (325 mg total) by mouth 2 (two) times daily with a meal. Qty: 60 tablet, Refills: 0    furosemide (LASIX) 80 MG tablet Take 2 tablets (160 mg total) by mouth 2 (two) times daily. Qty: 60 tablet, Refills: 0    hydrALAZINE (APRESOLINE) 50 MG tablet Take 1 tablet (50 mg total) by mouth every 6 (six) hours. Qty: 120 tablet, Refills: 0    sodium bicarbonate 650 MG tablet Take 1 tablet (650 mg total) by mouth 2 (two) times daily. Qty: 60 tablet, Refills: 0      CONTINUE these medications which have CHANGED   Details  mycophenolate (CELLCEPT) 250 MG capsule Take 3 capsules (750 mg  total) by mouth 2 (two) times daily. Qty: 60 capsule, Refills: 0    predniSONE (DELTASONE) 5 MG tablet Take 1 tablet (5 mg total) by mouth daily with breakfast. Qty: 30 tablet, Refills: 0    simvastatin (ZOCOR) 5 MG tablet Take 1 tablet (5 mg total) by mouth at bedtime. Qty: 30 tablet, Refills: 0    tacrolimus (PROGRAF) 1 MG capsule Take 11 capsules (11 mg total) by mouth 2 (two) times daily. Qty: 60 capsule, Refills: 0      CONTINUE these medications which have NOT CHANGED   Details  amLODipine (NORVASC) 10 MG tablet Take 10 mg by mouth daily.    aspirin 81 MG tablet Take 81 mg by mouth daily.    atenolol (TENORMIN) 100 MG tablet Take 100 mg by mouth daily.    triamcinolone (NASACORT) 55 MCG/ACT AERO nasal inhaler Place 1 spray into the nose at bedtime as needed (congestion).      STOP taking these medications     CALCIUM PO      doxazosin (CARDURA) 8 MG tablet      enalapril (VASOTEC) 10 MG tablet         ALLERGIES:  No Known Allergies  BRIEF HPI:  See H&P, Labs, Consult and Test reports for all details in brief, patient is a 49 year old male with history of chronic knee disease stage 3-4-with history of renal transplant who was admitted for evaluation of lower extremity edema and shortness of  breath. Was found to have significantly elevated creatinine level around 12 and admitted for further evaluation and treatment  CONSULTATIONS:   nephrology  PERTINENT RADIOLOGIC STUDIES: Dg Chest 2 View  02/14/2015   CLINICAL DATA:  Short of breath for 1 week  EXAM: CHEST  2 VIEW  COMPARISON:  Radiograph 02/13/2005  FINDINGS: Stable enlarged cardiac silhouette. Chronic bronchitic markings centrally. No effusion, infiltrate, or pneumothorax. Trace pleural effusion noted on the lateral projection. This is similar to comparison exam. No acute osseous abnormality.  IMPRESSION: 1. Hyperinflated lungs and trace pleural effusion not changed from prior. 2. Chronic bronchitic markings 3. No  changed from exam same day.   Electronically Signed   By: Suzy Bouchard M.D.   On: 02/14/2015 18:31   Dg Chest 2 View  02/14/2015   CLINICAL DATA:  Chest pain. Elevated blood pressure. Initial encounter.  EXAM: CHEST  2 VIEW  COMPARISON:  None.  FINDINGS: The heart size and mediastinal contours are normal. Increased markings at both lung bases may reflect atelectasis or scarring. There is no confluent airspace opacity, edema or significant pleural effusion. The bones appear unremarkable.  IMPRESSION: Mildly increased markings at both lung bases, probably reflecting atelectasis or scarring. Without prior examinations, acute inflammation cannot be completely excluded, although there is no consolidation.   Electronically Signed   By: Richardean Sale M.D.   On: 02/14/2015 17:06   US Renal Transplant W/doppler  02/15/2015   CLINICAL DATA:  2001 transplant.  Acute renal insufficiency.  EXAM: ULTRASOUND OF RENAL TRANSPLANT WITH DOPPLER  TECHNIQUE: Ultrasound examination of the renal transplant was performed with gray-scale, color and duplex doppler evaluation.  COMPARISON:  None.  FINDINGS: Transplant kidney location:  Right lower quadrant  Transplant kidney:  Length: 12.6 cm. Normal in size and parenchymal echogenicity. No evidence of mass or hydronephrosis.  Color flow in the main renal artery:  Present  Color flow in the main renal vein:  Present  Duplex Doppler Evaluation  Resistive index in main renal artery: 0.76  Venous waveform in main renal vein:  Present  Resistive index in upper pole intrarenal artery: 0.58  Resistive index in lower pole intrarenal artery: 0.72  Bladder: Normal for degree of bladder distention.  Other findings:  None.  IMPRESSION: Unremarkable appearances of the transplant kidney.   Electronically Signed   By: Andreas Newport M.D.   On: 02/15/2015 01:17     PERTINENT LAB RESULTS: CBC:  Recent Labs  02/16/15 0700 02/17/15 0508  WBC 4.7 5.9  HGB 8.1* 8.2*  HCT 25.7* 25.4*    PLT 184 192   CMET CMP     Component Value Date/Time   NA 139 02/17/2015 0508   K 3.7 02/17/2015 0508   CL 107 02/17/2015 0508   CO2 16* 02/17/2015 0508   GLUCOSE 85 02/17/2015 0508   BUN 71* 02/17/2015 0508   CREATININE 12.15* 02/17/2015 0508   CREATININE 10.76* 02/14/2015 1612   CALCIUM 6.7* 02/17/2015 0508   PROT 5.3* 02/15/2015 0436   ALBUMIN 2.5* 02/17/2015 0508   AST 13* 02/15/2015 0436   ALT 14* 02/15/2015 0436   ALKPHOS 81 02/15/2015 0436   BILITOT 0.5 02/15/2015 0436   GFRNONAA 4* 02/17/2015 0508   GFRAA 5* 02/17/2015 0508    GFR Estimated Creatinine Clearance: 9 mL/min (by C-G formula based on Cr of 12.15). No results for input(s): LIPASE, AMYLASE in the last 72 hours. No results for input(s): CKTOTAL, CKMB, CKMBINDEX, TROPONINI in the last 72 hours. Invalid  input(s): POCBNP No results for input(s): DDIMER in the last 72 hours. No results for input(s): HGBA1C in the last 72 hours. No results for input(s): CHOL, HDL, LDLCALC, TRIG, CHOLHDL, LDLDIRECT in the last 72 hours.  Recent Labs  02/15/15 0436  TSH 1.375    Recent Labs  02/15/15 0436 02/15/15 0830  FERRITIN 33  --   TIBC 199*  --   IRON 26*  --   RETICCTPCT  --  1.5   Coags:  Recent Labs  02/15/15 0436  INR 1.19   Microbiology: No results found for this or any previous visit (from the past 240 hour(s)).   BRIEF HOSPITAL COURSE:  Acute on CKD Stage 4-likely ESRD: Etiology felt to be progression of chronic allograft nephropathy due to non compliance to immunosuppressives and lack up of follow up with nephrology. Renal consulted, recommendation were to to resume immunosuppressives,  attempt diuresis with IV Lasix. Renal Ultrasound negative for Hydronephroses. Creatinine essentially unchanged. Nephrology did consult vascular surgery to see if we can start planning for access, however patient at this time refuses any access placement. Nephrology does not have any further recommendations-apart  from discharge home today with close outpatient follow-up with his primary nephrologist-Dr. Powell-which has been scheduled for 5/9. Shortness of breath is better, less edema-unfortunately suspect that this patient will require dialysis soon.  Active Problems:  Hx of Renal Transplant:see above.   Anemia:suspect related to CKD, was transfused 1 unit of PRBC on 5/2-Hb stable at 8.2. Defer Epo/ Iron to renal.Follow CBC closely in the outpatient setting.   Metabolic Acidosis:secondary to CKD. Started on bicarb-follow lytes closely in the outpatient setting   KGU:RKYHCWCBJS controlled, continue with Atenolol, Amlodipine, and Hydralazine.Pleasel follow and titrate accordingly in the outpatient setting.   Volume Overload:secondary to worsening renal function. Doubt CHF- echocardiogram showed his ejection fraction   OSA (obstructive sleep apnea):continue CPAP   TODAY-DAY OF DISCHARGE:  Subjective:   Hendrick Pavich today has no headache,no chest abdominal pain,no new weakness tingling or numbness, feels much better   Objective:   Blood pressure 145/82, pulse 77, temperature 97.9 F (36.6 C), temperature source Oral, resp. rate 17, height 5' 10"  (1.778 m), weight 103.738 kg (228 lb 11.2 oz), SpO2 100 %.  Intake/Output Summary (Last 24 hours) at 02/17/15 1110 Last data filed at 02/17/15 0942  Gross per 24 hour  Intake    600 ml  Output   1850 ml  Net  -1250 ml   Filed Weights   02/14/15 1740 02/15/15 0119 02/16/15 2055  Weight: 102.513 kg (226 lb) 104.327 kg (230 lb) 103.738 kg (228 lb 11.2 oz)    Exam Awake Alert, Oriented *3, No new F.N deficits, Normal affect [place].AT,PERRAL Supple Neck,No JVD, No cervical lymphadenopathy appriciated.  Symmetrical Chest wall movement, Good air movement bilaterally, CTAB with no rales  RRR,No Gallops,Rubs or new Murmurs, No Parasternal Heave +ve B.Sounds, Abd Soft, Non tender, No organomegaly appriciated, No rebound -guarding or rigidity. No  Cyanosis, Clubbing, No new Rash or bruise. Has 1+ pitting edema  DISCHARGE CONDITION: Stable  DISPOSITION: Home  DISCHARGE INSTRUCTIONS:    Activity:  As tolerated  Diet recommendation: Heart Healthy diet Fluid restriction 1.2 lit/day  Discharge Instructions    (HEART FAILURE PATIENTS) Call MD:  Anytime you have any of the following symptoms: 1) 3 pound weight gain in 24 hours or 5 pounds in 1 week 2) shortness of breath, with or without a dry hacking cough 3) swelling in the hands, feet or  stomach 4) if you have to sleep on extra pillows at night in order to breathe.    Complete by:  As directed      Diet - low sodium heart healthy    Complete by:  As directed      Increase activity slowly    Complete by:  As directed            Follow-up Information    Call Adele Barthel, MD.   Specialty:  Vascular Surgery   Why:  Please call to make appointment for dialysis access.    Contact information:   [address] [place] [address] [phone]       Follow up with Estanislado Emms, MD On 02/22/2015.   Specialty:  Nephrology   Why:  appt at 11 am   Contact information:   [place] [address] [phone]       Total Time spent on discharge equals  45 minutes  Signed: Oren Binet 02/17/2015 11:10 AM

## 2015-02-17 NOTE — Progress Notes (Signed)
Discharge instructions reviewed with pt; allowing time for questions. Pt verbalized understanding. Prescriptions given to pt. IV removed without issue. Pt leaving the unit ambulatory with Tech.

## 2015-02-25 ENCOUNTER — Encounter: Payer: Self-pay | Admitting: Vascular Surgery

## 2015-02-26 ENCOUNTER — Ambulatory Visit: Payer: PRIVATE HEALTH INSURANCE | Admitting: Vascular Surgery

## 2015-03-06 ENCOUNTER — Other Ambulatory Visit: Payer: Self-pay | Admitting: Internal Medicine

## 2015-03-07 ENCOUNTER — Other Ambulatory Visit: Payer: Self-pay | Admitting: Internal Medicine

## 2015-03-08 MED ORDER — FUROSEMIDE 80 MG PO TABS: 160.0000 mg | ORAL_TABLET | Freq: Two times a day (BID) | ORAL | Status: DC

## 2015-03-12 ENCOUNTER — Encounter: Payer: Self-pay | Admitting: Vascular Surgery

## 2015-03-17 ENCOUNTER — Ambulatory Visit: Payer: PRIVATE HEALTH INSURANCE | Admitting: Vascular Surgery

## 2015-05-06 ENCOUNTER — Other Ambulatory Visit (HOSPITAL_COMMUNITY): Payer: Self-pay

## 2015-05-07 ENCOUNTER — Encounter (HOSPITAL_COMMUNITY): Payer: PRIVATE HEALTH INSURANCE

## 2015-06-02 ENCOUNTER — Encounter: Payer: Self-pay | Admitting: Vascular Surgery

## 2015-06-03 ENCOUNTER — Ambulatory Visit: Payer: PRIVATE HEALTH INSURANCE | Admitting: Vascular Surgery

## 2015-08-08 DIAGNOSIS — Z940 Kidney transplant status: Secondary | ICD-10-CM | POA: Insufficient documentation

## 2015-08-08 DIAGNOSIS — Z862 Personal history of diseases of the blood and blood-forming organs and certain disorders involving the immune mechanism: Secondary | ICD-10-CM | POA: Insufficient documentation

## 2015-08-08 DIAGNOSIS — Z91199 Patient's noncompliance with other medical treatment and regimen due to unspecified reason: Secondary | ICD-10-CM | POA: Insufficient documentation

## 2015-08-08 DIAGNOSIS — Z9119 Patient's noncompliance with other medical treatment and regimen: Secondary | ICD-10-CM | POA: Insufficient documentation

## 2015-08-10 DIAGNOSIS — N189 Chronic kidney disease, unspecified: Secondary | ICD-10-CM

## 2015-08-10 DIAGNOSIS — N179 Acute kidney failure, unspecified: Secondary | ICD-10-CM | POA: Insufficient documentation

## 2015-08-12 DIAGNOSIS — D62 Acute posthemorrhagic anemia: Secondary | ICD-10-CM | POA: Insufficient documentation

## 2015-08-12 DIAGNOSIS — K922 Gastrointestinal hemorrhage, unspecified: Secondary | ICD-10-CM | POA: Insufficient documentation

## 2015-08-12 DIAGNOSIS — A0472 Enterocolitis due to Clostridium difficile, not specified as recurrent: Secondary | ICD-10-CM | POA: Insufficient documentation

## 2015-08-20 DIAGNOSIS — R7881 Bacteremia: Secondary | ICD-10-CM | POA: Insufficient documentation

## 2015-08-24 DIAGNOSIS — D509 Iron deficiency anemia, unspecified: Secondary | ICD-10-CM | POA: Insufficient documentation

## 2015-08-24 DIAGNOSIS — A499 Bacterial infection, unspecified: Secondary | ICD-10-CM | POA: Insufficient documentation

## 2015-09-06 DIAGNOSIS — Z23 Encounter for immunization: Secondary | ICD-10-CM | POA: Insufficient documentation

## 2015-09-07 ENCOUNTER — Other Ambulatory Visit: Payer: Self-pay | Admitting: *Deleted

## 2015-09-07 MED ORDER — FUROSEMIDE 80 MG PO TABS: 160.0000 mg | ORAL_TABLET | Freq: Two times a day (BID) | ORAL | Status: DC

## 2015-09-10 DIAGNOSIS — E440 Moderate protein-calorie malnutrition: Secondary | ICD-10-CM | POA: Insufficient documentation

## 2015-09-15 DIAGNOSIS — I120 Hypertensive chronic kidney disease with stage 5 chronic kidney disease or end stage renal disease: Secondary | ICD-10-CM | POA: Insufficient documentation

## 2015-09-21 ENCOUNTER — Encounter (HOSPITAL_COMMUNITY): Payer: Self-pay | Admitting: *Deleted

## 2015-09-21 ENCOUNTER — Observation Stay (HOSPITAL_COMMUNITY)
Admission: EM | Admit: 2015-09-21 | Discharge: 2015-09-24 | Disposition: A | Discharge status: Home / Self Care | Payer: PRIVATE HEALTH INSURANCE | Attending: Internal Medicine | Admitting: Internal Medicine

## 2015-09-21 ENCOUNTER — Emergency Department (HOSPITAL_COMMUNITY): Payer: Self-pay

## 2015-09-21 DIAGNOSIS — Z79899 Other long term (current) drug therapy: Secondary | ICD-10-CM | POA: Insufficient documentation

## 2015-09-21 DIAGNOSIS — G4733 Obstructive sleep apnea (adult) (pediatric): Secondary | ICD-10-CM | POA: Diagnosis present

## 2015-09-21 DIAGNOSIS — N186 End stage renal disease: Secondary | ICD-10-CM | POA: Insufficient documentation

## 2015-09-21 DIAGNOSIS — I10 Essential (primary) hypertension: Secondary | ICD-10-CM

## 2015-09-21 DIAGNOSIS — R600 Localized edema: Secondary | ICD-10-CM

## 2015-09-21 DIAGNOSIS — A419 Sepsis, unspecified organism: Principal | ICD-10-CM | POA: Insufficient documentation

## 2015-09-21 DIAGNOSIS — Z940 Kidney transplant status: Secondary | ICD-10-CM | POA: Insufficient documentation

## 2015-09-21 DIAGNOSIS — E785 Hyperlipidemia, unspecified: Secondary | ICD-10-CM | POA: Insufficient documentation

## 2015-09-21 DIAGNOSIS — I120 Hypertensive chronic kidney disease with stage 5 chronic kidney disease or end stage renal disease: Secondary | ICD-10-CM | POA: Insufficient documentation

## 2015-09-21 DIAGNOSIS — N2581 Secondary hyperparathyroidism of renal origin: Secondary | ICD-10-CM | POA: Insufficient documentation

## 2015-09-21 DIAGNOSIS — Z9119 Patient's noncompliance with other medical treatment and regimen: Secondary | ICD-10-CM | POA: Insufficient documentation

## 2015-09-21 DIAGNOSIS — D696 Thrombocytopenia, unspecified: Secondary | ICD-10-CM | POA: Insufficient documentation

## 2015-09-21 DIAGNOSIS — Z23 Encounter for immunization: Secondary | ICD-10-CM | POA: Insufficient documentation

## 2015-09-21 DIAGNOSIS — D631 Anemia in chronic kidney disease: Secondary | ICD-10-CM | POA: Insufficient documentation

## 2015-09-21 DIAGNOSIS — Z992 Dependence on renal dialysis: Secondary | ICD-10-CM | POA: Insufficient documentation

## 2015-09-21 DIAGNOSIS — J189 Pneumonia, unspecified organism: Secondary | ICD-10-CM | POA: Insufficient documentation

## 2015-09-21 LAB — CBC
HEMATOCRIT: 32.8 % — AB (ref 39.0–52.0)
HEMOGLOBIN: 10.6 g/dL — AB (ref 13.0–17.0)
MCH: 27 pg (ref 26.0–34.0)
MCHC: 32.3 g/dL (ref 30.0–36.0)
MCV: 83.7 fL (ref 78.0–100.0)
Platelets: 97 10*3/uL — ABNORMAL LOW (ref 150–400)
RBC: 3.92 MIL/uL — ABNORMAL LOW (ref 4.22–5.81)
RDW: 20.4 % — AB (ref 11.5–15.5)
WBC: 9.4 10*3/uL (ref 4.0–10.5)

## 2015-09-21 LAB — CBC WITH DIFFERENTIAL/PLATELET
Basophils Absolute: 0 10*3/uL (ref 0.0–0.1)
Basophils Relative: 0 %
EOS PCT: 2 %
Eosinophils Absolute: 0.2 10*3/uL (ref 0.0–0.7)
HEMATOCRIT: 25.9 % — AB (ref 39.0–52.0)
HEMOGLOBIN: 7.9 g/dL — AB (ref 13.0–17.0)
LYMPHS ABS: 1.7 10*3/uL (ref 0.7–4.0)
LYMPHS PCT: 22 %
MCH: 25.7 pg — AB (ref 26.0–34.0)
MCHC: 30.5 g/dL (ref 30.0–36.0)
MCV: 84.4 fL (ref 78.0–100.0)
Monocytes Absolute: 0.6 10*3/uL (ref 0.1–1.0)
Monocytes Relative: 8 %
NEUTROS ABS: 5.2 10*3/uL (ref 1.7–7.7)
NEUTROS PCT: 68 %
Platelets: 71 10*3/uL — ABNORMAL LOW (ref 150–400)
RBC: 3.07 MIL/uL — AB (ref 4.22–5.81)
RDW: 20.4 % — ABNORMAL HIGH (ref 11.5–15.5)
WBC: 7.6 10*3/uL (ref 4.0–10.5)

## 2015-09-21 LAB — I-STAT CG4 LACTIC ACID, ED
Lactic Acid, Venous: 1.73 mmol/L (ref 0.5–2.0)
Lactic Acid, Venous: 0.65 mmol/L (ref 0.5–2.0)

## 2015-09-21 LAB — COMPREHENSIVE METABOLIC PANEL
ALBUMIN: 3.5 g/dL (ref 3.5–5.0)
ALT: 14 U/L — ABNORMAL LOW (ref 17–63)
ANION GAP: 17 — AB (ref 5–15)
AST: 33 U/L (ref 15–41)
Alkaline Phosphatase: 118 U/L (ref 38–126)
BILIRUBIN TOTAL: 1.7 mg/dL — AB (ref 0.3–1.2)
BUN: 12 mg/dL (ref 6–20)
CHLORIDE: 95 mmol/L — AB (ref 101–111)
CO2: 25 mmol/L (ref 22–32)
Calcium: 9 mg/dL (ref 8.9–10.3)
Creatinine, Ser: 6.67 mg/dL — ABNORMAL HIGH (ref 0.61–1.24)
GFR calc Af Amer: 10 mL/min — ABNORMAL LOW (ref >60)
GFR calc non Af Amer: 9 mL/min — ABNORMAL LOW (ref >60)
GLUCOSE: 83 mg/dL (ref 65–99)
POTASSIUM: 4.2 mmol/L (ref 3.5–5.1)
SODIUM: 137 mmol/L (ref 135–145)
TOTAL PROTEIN: 8.5 g/dL — AB (ref 6.5–8.1)

## 2015-09-21 LAB — MAGNESIUM: Magnesium: 1.7 mg/dL (ref 1.7–2.4)

## 2015-09-21 LAB — CREATININE, SERUM
Creatinine, Ser: 7.06 mg/dL — ABNORMAL HIGH (ref 0.61–1.24)
GFR calc non Af Amer: 8 mL/min — ABNORMAL LOW (ref >60)
GFR, EST AFRICAN AMERICAN: 9 mL/min — AB (ref >60)

## 2015-09-21 LAB — EXPECTORATED SPUTUM ASSESSMENT W REFEX TO RESP CULTURE

## 2015-09-21 LAB — LIPASE, BLOOD: Lipase: 141 U/L — ABNORMAL HIGH (ref 11–51)

## 2015-09-21 LAB — PHOSPHORUS: Phosphorus: 2.5 mg/dL (ref 2.5–4.6)

## 2015-09-21 LAB — EXPECTORATED SPUTUM ASSESSMENT W GRAM STAIN, RFLX TO RESP C

## 2015-09-21 LAB — I-STAT TROPONIN, ED: Troponin i, poc: 0.07 ng/mL (ref 0.00–0.08)

## 2015-09-21 LAB — BRAIN NATRIURETIC PEPTIDE: B Natriuretic Peptide: 2596.3 pg/mL — ABNORMAL HIGH (ref 0.0–100.0)

## 2015-09-21 MED ORDER — ONDANSETRON 4 MG PO TBDP
ORAL_TABLET | ORAL | Status: AC
  Filled 2015-09-21: qty 1

## 2015-09-21 MED ORDER — AMLODIPINE BESYLATE 5 MG PO TABS
5.0000 mg | ORAL_TABLET | Freq: Every day | ORAL | Status: DC
Start: 2015-09-21 — End: 2015-09-22
  Administered 2015-09-21 – 2015-09-22 (×2): 5 mg via ORAL
  Filled 2015-09-21 (×2): qty 1

## 2015-09-21 MED ORDER — DEXTROMETHORPHAN-GUAIFENESIN 10-200 MG PO CAPS: 1.0000 | ORAL_CAPSULE | Freq: Every day | ORAL | Status: DC | PRN

## 2015-09-21 MED ORDER — CARVEDILOL 12.5 MG PO TABS
12.5000 mg | ORAL_TABLET | Freq: Two times a day (BID) | ORAL | Status: DC
  Administered 2015-09-21 – 2015-09-22 (×2): 12.5 mg via ORAL
  Filled 2015-09-21 (×3): qty 1

## 2015-09-21 MED ORDER — GUAIFENESIN 200 MG PO TABS
200.0000 mg | ORAL_TABLET | Freq: Every day | ORAL | Status: DC | PRN
  Administered 2015-09-23: 200 mg via ORAL
  Filled 2015-09-21 (×2): qty 1

## 2015-09-21 MED ORDER — PIPERACILLIN-TAZOBACTAM 3.375 G IVPB 30 MIN
3.3750 g | Freq: Once | INTRAVENOUS | Status: AC
  Administered 2015-09-21: 3.375 g via INTRAVENOUS
  Filled 2015-09-21: qty 50

## 2015-09-21 MED ORDER — ACETAMINOPHEN 325 MG PO TABS
650.0000 mg | ORAL_TABLET | Freq: Once | ORAL | Status: AC
  Administered 2015-09-21: 650 mg via ORAL
  Filled 2015-09-21: qty 2

## 2015-09-21 MED ORDER — VANCOMYCIN HCL IN DEXTROSE 1-5 GM/200ML-% IV SOLN: 1000.0000 mg | INTRAVENOUS | Status: DC

## 2015-09-21 MED ORDER — SODIUM CHLORIDE 0.9 % IJ SOLN: 3.0000 mL | INTRAMUSCULAR | Status: DC | PRN

## 2015-09-21 MED ORDER — SODIUM CHLORIDE 0.9 % IV BOLUS (SEPSIS)
1000.0000 mL | Freq: Once | INTRAVENOUS | Status: AC
  Administered 2015-09-21: 1000 mL via INTRAVENOUS

## 2015-09-21 MED ORDER — VANCOMYCIN HCL IN DEXTROSE 1-5 GM/200ML-% IV SOLN
1000.0000 mg | Freq: Once | INTRAVENOUS | Status: DC
  Filled 2015-09-21: qty 200

## 2015-09-21 MED ORDER — HEPARIN SODIUM (PORCINE) 5000 UNIT/ML IJ SOLN
5000.0000 [IU] | Freq: Three times a day (TID) | INTRAMUSCULAR | Status: DC
  Administered 2015-09-22: 5000 [IU] via SUBCUTANEOUS
  Filled 2015-09-21: qty 1

## 2015-09-21 MED ORDER — PIPERACILLIN-TAZOBACTAM 3.375 G IVPB 30 MIN: 3.3750 g | Freq: Three times a day (TID) | INTRAVENOUS | Status: DC

## 2015-09-21 MED ORDER — SODIUM CHLORIDE 0.9 % IJ SOLN
3.0000 mL | Freq: Two times a day (BID) | INTRAMUSCULAR | Status: DC
  Administered 2015-09-22 – 2015-09-24 (×5): 3 mL via INTRAVENOUS

## 2015-09-21 MED ORDER — SIMVASTATIN 5 MG PO TABS
5.0000 mg | ORAL_TABLET | Freq: Every day | ORAL | Status: DC
  Administered 2015-09-22 – 2015-09-23 (×2): 5 mg via ORAL
  Filled 2015-09-21 (×4): qty 1

## 2015-09-21 MED ORDER — HYDRALAZINE HCL 20 MG/ML IJ SOLN
10.0000 mg | INTRAMUSCULAR | Status: DC | PRN
  Administered 2015-09-22 – 2015-09-23 (×2): 10 mg via INTRAVENOUS
  Filled 2015-09-21 (×3): qty 1

## 2015-09-21 MED ORDER — VANCOMYCIN HCL 10 G IV SOLR
2000.0000 mg | Freq: Once | INTRAVENOUS | Status: AC
  Administered 2015-09-21: 2000 mg via INTRAVENOUS
  Filled 2015-09-21: qty 2000

## 2015-09-21 MED ORDER — SEVELAMER CARBONATE 800 MG PO TABS
1600.0000 mg | ORAL_TABLET | Freq: Three times a day (TID) | ORAL | Status: DC
  Administered 2015-09-22 – 2015-09-24 (×7): 1600 mg via ORAL
  Filled 2015-09-21 (×7): qty 2

## 2015-09-21 MED ORDER — PIPERACILLIN-TAZOBACTAM IN DEX 2-0.25 GM/50ML IV SOLN
2.2500 g | Freq: Three times a day (TID) | INTRAVENOUS | Status: DC
  Administered 2015-09-22 – 2015-09-23 (×6): 2.25 g via INTRAVENOUS
  Filled 2015-09-21 (×10): qty 50

## 2015-09-21 MED ORDER — ONDANSETRON 4 MG PO TBDP
4.0000 mg | ORAL_TABLET | Freq: Once | ORAL | Status: AC | PRN
  Administered 2015-09-21: 4 mg via ORAL

## 2015-09-21 MED ORDER — SODIUM CHLORIDE 0.9 % IV SOLN: 250.0000 mL | INTRAVENOUS | Status: DC | PRN

## 2015-09-21 NOTE — Progress Notes (Signed)
Called ED RN for report. Room ready.

## 2015-09-21 NOTE — ED Provider Notes (Signed)
CSN: [id number]     Arrival date & time 09/21/15  1527 History   First MD Initiated Contact with Patient 09/21/15 1554     Chief Complaint  Patient presents with  . Cough  . Shortness of Breath  . Emesis     (Consider location/radiation/quality/duration/timing/severity/associated sxs/prior Treatment) HPI  Patient is a 49 year old male with past medical history significant for ESRD on HD (TuThSa), h/o renal transplant (no longer on immunosuppressants), hypertension, hyperlipidemia, who presents to the emergency department with cough, vomiting, diarrhea, fever. 6 days ago patient had cough, congestion, sputum production with associated shortness of breath. Progressively worsening since that time. Reports a fever that started 4 days ago, Tmax 100 at home.  2 day history of nausea, vomiting, diarrhea. Denies hemoptysis, hematemesis or blood in his stool. Denies chest pain, back pain, dysuria. Reports mild periumbilical abdominal pain, intermittent, only present when he has a "coughing fit". Son at home sick with a cold. Received influenza vaccination in Oct. States he is no longer on immunosuppressant medications because he ran out and he has not yet been put back on transplant list.    Past Medical History  Diagnosis Date  . Hypertension   . Hyperlipidemia   . Chronic kidney disease   . OSA (obstructive sleep apnea) 03/27/2013  . Blood transfusion without reported diagnosis    Past Surgical History  Procedure Laterality Date  . Kidney transplant     Family History  Problem Relation Age of Onset  . Heart disease Mother   . Cancer Maternal Grandmother   . Cancer Maternal Grandfather   . Asthma Son   . Asthma Son    Social History  Substance Use Topics  . Smoking status: Never Smoker   . Smokeless tobacco: Never Used  . Alcohol Use: No    Review of Systems  Constitutional: Positive for fever, chills and fatigue. Negative for appetite change.  HENT: Positive for congestion.  Negative for sneezing and trouble swallowing.   Eyes: Negative for visual disturbance.  Respiratory: Positive for cough and shortness of breath.   Cardiovascular: Negative for chest pain and palpitations.  Gastrointestinal: Positive for nausea, vomiting, abdominal pain and diarrhea. Negative for constipation and blood in stool.  Genitourinary: Negative for dysuria, hematuria, flank pain and decreased urine volume.  Musculoskeletal: Negative for back pain.  Skin: Negative for rash.  Neurological: Negative for dizziness, seizures, weakness, light-headedness and headaches.  Psychiatric/Behavioral: Negative for behavioral problems.  All other systems reviewed and are negative.     Allergies  Review of patient's allergies indicates no known allergies.  Home Medications   Prior to Admission medications   Medication Sig Start Date End Date Taking? Authorizing Provider  amLODipine (NORVASC) 10 MG tablet Take 10 mg by mouth daily.   Yes Historical Provider, MD  carvedilol (COREG) 25 MG tablet Take 25 mg by mouth every 12 (twelve) hours. 08/23/15  Yes Historical Provider, MD  Dextromethorphan-Guaifenesin (CORICIDIN HBP CONGESTION/COUGH) 10-200 MG CAPS Take 1 tablet by mouth daily as needed (cough, congestion).   Yes Historical Provider, MD  hydrALAZINE (APRESOLINE) 25 MG tablet Take 25 mg by mouth every 8 (eight) hours. 08/23/15  Yes Historical Provider, MD  predniSONE (DELTASONE) 5 MG tablet Take 1 tablet (5 mg total) by mouth daily with breakfast. 02/17/15  Yes Shanker Kristeen Mans, MD  pseudoephedrine (SUDAFED) 30 MG tablet Take 30 mg by mouth every 4 (four) hours as needed for congestion.   Yes Historical Provider, MD  sevelamer carbonate (RENVELA) 800  MG tablet Take 1,600 mg by mouth 3 (three) times daily. 08/23/15 08/22/16 Yes Historical Provider, MD  simvastatin (ZOCOR) 5 MG tablet Take 1 tablet (5 mg total) by mouth at bedtime. 02/17/15  Yes Shanker Kristeen Mans, MD  calcitRIOL (ROCALTROL) 0.25 MCG  capsule Take 1 capsule (0.25 mcg total) by mouth daily. Patient not taking: Reported on 09/21/2015 02/17/15   Jonetta Osgood, MD  calcium acetate (PHOSLO) 667 MG capsule Take 2 capsules (1,334 mg total) by mouth 3 (three) times daily with meals. Patient not taking: Reported on 09/21/2015 02/17/15   Jonetta Osgood, MD  ferrous sulfate 325 (65 FE) MG tablet Take 1 tablet (325 mg total) by mouth 2 (two) times daily with a meal. Patient not taking: Reported on 09/21/2015 02/17/15   Shanker Kristeen Mans, MD  furosemide (LASIX) 80 MG tablet Take 2 tablets (160 mg total) by mouth 2 (two) times daily. Patient not taking: Reported on 09/21/2015 09/07/15   Tresa Garter, MD  hydrALAZINE (APRESOLINE) 50 MG tablet Take 1 tablet (50 mg total) by mouth every 6 (six) hours. Patient not taking: Reported on 09/21/2015 02/17/15   Jonetta Osgood, MD  mycophenolate (CELLCEPT) 250 MG capsule Take 3 capsules (750 mg total) by mouth 2 (two) times daily. Patient not taking: Reported on 09/21/2015 02/17/15   Jonetta Osgood, MD  sodium bicarbonate 650 MG tablet Take 1 tablet (650 mg total) by mouth 2 (two) times daily. Patient not taking: Reported on 09/21/2015 02/17/15   Jonetta Osgood, MD  tacrolimus (PROGRAF) 1 MG capsule Take 11 capsules (11 mg total) by mouth 2 (two) times daily. Patient not taking: Reported on 09/21/2015 02/17/15   Jonetta Osgood, MD   BP 188/117 mmHg  Pulse 126  Temp(Src) 101 F (38.3 C) (Oral)  Resp 18  SpO2 99% Physical Exam  Constitutional: He is oriented to person, place, and time. He appears well-developed and well-nourished. He appears ill.  HENT:  Head: Normocephalic and atraumatic.  Mouth/Throat: Oropharynx is clear and moist.  Eyes: Conjunctivae and EOM are normal. Pupils are equal, round, and reactive to light.  Neck: Normal range of motion. No JVD present. No tracheal deviation present.  Cardiovascular: Normal rate, regular rhythm, normal heart sounds and intact distal pulses.    Pulmonary/Chest: Effort normal and breath sounds normal.  Increased work of breathing. Coarse breath sounds of right lower lung fields  Abdominal: Soft. Bowel sounds are normal. He exhibits no distension. There is no tenderness. There is no rebound and no guarding.  Musculoskeletal: Normal range of motion. He exhibits no edema.  Neurological: He is alert and oriented to person, place, and time.  Skin: Skin is warm. No rash noted. He is not diaphoretic. No pallor.  Psychiatric: He has a normal mood and affect.  Nursing note and vitals reviewed.   ED Course  Procedures (including critical care time) Labs Review Labs Reviewed  LIPASE, BLOOD - Abnormal; Notable for the following:    Lipase 141 (*)    All other components within normal limits  COMPREHENSIVE METABOLIC PANEL - Abnormal; Notable for the following:    Chloride 95 (*)    Creatinine, Ser 6.67 (*)    Total Protein 8.5 (*)    ALT 14 (*)    Total Bilirubin 1.7 (*)    GFR calc non Af Amer 9 (*)    GFR calc Af Amer 10 (*)    Anion gap 17 (*)    All other components within  normal limits  CBC - Abnormal; Notable for the following:    RBC 3.92 (*)    Hemoglobin 10.6 (*)    HCT 32.8 (*)    RDW 20.4 (*)    Platelets 97 (*)    All other components within normal limits  CULTURE, BLOOD (ROUTINE X 2)  CULTURE, BLOOD (ROUTINE X 2)  CULTURE, BLOOD (ROUTINE X 2)  CULTURE, BLOOD (ROUTINE X 2)  CULTURE, EXPECTORATED SPUTUM-ASSESSMENT  GRAM STAIN  URINE CULTURE  URINALYSIS, ROUTINE W REFLEX MICROSCOPIC (NOT AT [facility])  HIV ANTIBODY (ROUTINE TESTING)  LEGIONELLA PNEUMOPHILA SEROGP 1 UR AG  STREP PNEUMONIAE URINARY ANTIGEN  CBC  CREATININE, SERUM  MAGNESIUM  PHOSPHORUS  BRAIN NATRIURETIC PEPTIDE  INFLUENZA PANEL BY PCR (TYPE A & B, H1N1)  CBC WITH DIFFERENTIAL/PLATELET  COMPREHENSIVE METABOLIC PANEL  CBC WITH DIFFERENTIAL/PLATELET  I-STAT TROPOININ, ED  I-STAT CG4 LACTIC ACID, ED  I-STAT CG4 LACTIC ACID, ED    Imaging  Review Dg Chest 2 View  09/21/2015  CLINICAL DATA:  SOB for 3 days. Cough for 6 days. History of hypertension, dialysis. EXAM: CHEST  2 VIEW COMPARISON:  02/14/2015 FINDINGS: Heart size is upper normal. Right-sided dialysis catheter tip to the upper right atrium. Moderate right pleural effusion is increased. Right basilar opacity obscures the hemidiaphragm. IMPRESSION: Right lower lobe atelectasis or infiltrate and right pleural effusion. Electronically Signed   By: Nolon Nations M.D.   On: 09/21/2015 17:05   I have personally reviewed and evaluated these images and lab results as part of my medical decision-making.   EKG Interpretation None      MDM   Final diagnoses:  None    Patient is a 49 year old male with past medical history significant for ESRD on HD (TuThSa), h/o renal transplant (no longer on immunosuppressants), hypertension, hyperlipidemia, who presents to the emergency department with cough, vomiting, diarrhea, fever. On arrival patient appears ill. Fever of 101, tachycardic 120s, hypertensive 160/100s, SpO2 100% on RA. Exam as above, notable for coarse breath sounds of the right side, good air movement, tachycardic, intact distal pulses, benign abdominal exam.  Patient given Tylenol, 1L bolus IV NS. Blood cultures obtained.  Labs remarkable for no WBC 9.4. Hgb 10.6 (baseline 8), PLT 97. No electrolyte abnormalities. Lipase 141. Cr 6.67 (baseline 12). No need for emergent dialysis. Patient completed 2 out of 4 hours of HD. LA 1.73. Chest x-ray showed concerns for possible RLL pneumonia. Since patient is at a dialysis center well treated for HCAP, started on vancomycin/Zosyn.  Patient remained hemodynamically stable while in the emergency department. Patient will be admitted to medicine for further treatment. Patient stable for the floor. Transferred to the floor in stable condition.      Nathaniel Man, MD 09/22/15 [place], MD 09/25/15 [phone]

## 2015-09-21 NOTE — Progress Notes (Signed)
ANTIBIOTIC CONSULT NOTE - INITIAL  Pharmacy Consult for vancomycin + zosyn Indication: rule out pneumonia  No Known Allergies  Patient Measurements:   Adjusted Body Weight:   Vital Signs: Temp: 101 F (38.3 C) (12/06 1637) Temp Source: Oral (12/06 1637) BP: 205/125 mmHg (12/06 1637) Pulse Rate: 119 (12/06 1637) Intake/Output from previous day:   Intake/Output from this shift:    Labs:  Recent Labs  09/21/15 1556  WBC 9.4  HGB 10.6*  PLT 97*  CREATININE 6.67*   CrCl cannot be calculated (Unknown ideal weight.). No results for input(s): VANCOTROUGH, VANCOPEAK, VANCORANDOM, GENTTROUGH, GENTPEAK, GENTRANDOM, TOBRATROUGH, TOBRAPEAK, TOBRARND, AMIKACINPEAK, AMIKACINTROU, AMIKACIN in the last 72 hours.   Microbiology: No results found for this or any previous visit (from the past 720 hour(s)).  Medical History: Past Medical History  Diagnosis Date  . Hypertension   . Hyperlipidemia   . Chronic kidney disease   . OSA (obstructive sleep apnea) 03/27/2013  . Blood transfusion without reported diagnosis     Medications:  Anti-infectives    Start     Dose/Rate Route Frequency Ordered Stop   09/23/15 1200  vancomycin (VANCOCIN) IVPB 1000 mg/200 mL premix     1,000 mg 200 mL/hr over 60 Minutes Intravenous Every T-Th-Sa (Hemodialysis) 09/21/15 1804     09/22/15 0200  piperacillin-tazobactam (ZOSYN) IVPB 2.25 g     2.25 g 100 mL/hr over 30 Minutes Intravenous Every 8 hours 09/21/15 1804     09/21/15 2200  piperacillin-tazobactam (ZOSYN) IVPB 3.375 g  Status:  Discontinued     3.375 g 100 mL/hr over 30 Minutes Intravenous 3 times per day 09/21/15 1755 09/21/15 1801   09/21/15 1815  vancomycin (VANCOCIN) 2,000 mg in sodium chloride 0.9 % 500 mL IVPB     2,000 mg 250 mL/hr over 120 Minutes Intravenous  Once 09/21/15 1801     09/21/15 1815  piperacillin-tazobactam (ZOSYN) IVPB 3.375 g     3.375 g 100 mL/hr over 30 Minutes Intravenous  Once 09/21/15 1801     09/21/15 1800   vancomycin (VANCOCIN) IVPB 1000 mg/200 mL premix  Status:  Discontinued     1,000 mg 200 mL/hr over 60 Minutes Intravenous  Once 09/21/15 1755 09/21/15 1801     Assessment: 70 yom presented to the ED with coughing and vomiting. To start empiric vancomycin + zosyn for possible pneumonia. Tmax is 101 and WBC is WNL. Pt is ESRD on HD.   Vanc 12/6>> Zosyn 12/6>>  Goal of Therapy:  Vancomycin trough level 15-20 mcg/ml  Plan:  - Vancomycin 2gm IV x 1 then 1gm post-HD TTS - Zosyn 3.375gm IV x 1 then 2.25gm IV Q8H - F/u renal plans, C&S, clinical status and pre-HD vanc level when appropriate  Avrianna Smart, Rande Lawman 09/21/2015,6:04 PM

## 2015-09-21 NOTE — ED Notes (Signed)
Pt reports onset coughing x 6 days with onset of vomiting today while receiving HD, pt rcvd 2 Hrs of HD, pts wife reports the pt having wheezing, pt sent here from HD for chest xray, pt reports x 2 vomiting episodes, pt reports diarrhea x 2 episodes, A&O x4

## 2015-09-21 NOTE — H&P (Signed)
[facility] History and Physical  Jonathon Conley RFF:[id number] DOB: September 17, 1966 DOA: 09/21/2015  Referring physician: ED physician PCP: Pcp Not In System  Specialists: Trinda Pascal, MD, [place]  Chief Complaint:  Fever, SOB  HPI: Jonathon Conley is a 49 y.o. male with PMH of end-stage renal disease on hemodialysis Tuesday Thursday and Saturday, OSA, and anemia of chronic kidney disease who presents to the ED with fever and dyspnea 4 of days duration. Patient reports being in his usual state until approximately one week ago when he developed congestion and a productive cough. Symptoms worsened, and were accompanied by fever and dyspnea with minimal exertion 4 days ago. For the past 2 days, symptoms have included nausea vomiting and mild diarrhea. Predominant symptoms today are fevers, chills, and shortness of breath. Patient had been taking Robitussin and Sudafed at the direction of an advice nurse for the past 2 days but without improvement. At dialysis today, patient only completed 2 of 4 hours before he was directed to the emergency department by the dialysis personnel. Jonathon Conley denies hemoptysis, palpitations, leg swelling or tenderness, or chest pain. He takes prednisone 5 mg daily, and had previously undergone renal transplantation which ultimately failed, likely secondary to poor compliance with immunosuppressants. Patient is now been back on hemodialysis since May of this year and reports an upcoming appointment with [place] transplant team on 10/25/2015.  In the ED, Jonathon Conley was noted to be febrile to 38.3C with sinus tachycardia to the 120s. He was saturating well on room air and blood pressure was markedly elevated. He received a small fluid bolus and empiric doses of vancomycin and Zosyn for suspected healthcare associated pneumonia. Patient remained fairly stable from a respiratory standpoint in the ED and was admitted to the hospital for further evaluation and treatment of  suspected HCAP.  Where does patient live?   At home   SNF    Assistant living facility   Retirement center Can patient participate in ADLs?  Yes     Barely     None  Little     Some   Review of Systems:   General:  fevers, chills, no sweats, no weight change, no poor appetite, no fatigue HEENT: no blurry vision, hearing changes or sore throat Pulm:  Dyspnea and productive cough, no wheeze CV: no chest pain or palpitations Abd: nausea & vomiting, no abdominal pain, diarrhea, or constipation GU: no dysuria, hematuria, increased urinary frequency, or urgency  Ext: no leg edema Neuro: no focal weakness, numbness, or tingling, no vision change or hearing loss Skin: no rash, no wounds MSK: No muscle spasm, no deformity, no red, hot, or swollen joint Heme: No easy bruising or bleeding Travel history: No recent long distant travel    Allergy: No Known Allergies  Past Medical History  Diagnosis Date  . Hypertension   . Hyperlipidemia   . Chronic kidney disease   . OSA (obstructive sleep apnea) 03/27/2013  . Blood transfusion without reported diagnosis     Past Surgical History  Procedure Laterality Date  . Kidney transplant      Social History:  reports that he has never smoked. He has never used smokeless tobacco. He reports that he does not drink alcohol or use illicit drugs.  Family History:  Family History  Problem Relation Age of Onset  . Heart disease Mother   . Cancer Maternal Grandmother   . Cancer Maternal Grandfather   . Asthma Son   . Asthma Son  Prior to Admission medications   Medication Sig Start Date End Date Taking? Authorizing Provider  amLODipine (NORVASC) 10 MG tablet Take 10 mg by mouth daily.   Yes Historical Provider, MD  carvedilol (COREG) 25 MG tablet Take 25 mg by mouth every 12 (twelve) hours. 08/23/15  Yes Historical Provider, MD  Dextromethorphan-Guaifenesin (CORICIDIN HBP CONGESTION/COUGH) 10-200 MG CAPS Take 1 tablet by mouth daily as needed  (cough, congestion).   Yes Historical Provider, MD  hydrALAZINE (APRESOLINE) 25 MG tablet Take 25 mg by mouth every 8 (eight) hours. 08/23/15  Yes Historical Provider, MD  predniSONE (DELTASONE) 5 MG tablet Take 1 tablet (5 mg total) by mouth daily with breakfast. 02/17/15  Yes Shanker Kristeen Mans, MD  pseudoephedrine (SUDAFED) 30 MG tablet Take 30 mg by mouth every 4 (four) hours as needed for congestion.   Yes Historical Provider, MD  sevelamer carbonate (RENVELA) 800 MG tablet Take 1,600 mg by mouth 3 (three) times daily. 08/23/15 08/22/16 Yes Historical Provider, MD  simvastatin (ZOCOR) 5 MG tablet Take 1 tablet (5 mg total) by mouth at bedtime. 02/17/15  Yes Shanker Kristeen Mans, MD  calcitRIOL (ROCALTROL) 0.25 MCG capsule Take 1 capsule (0.25 mcg total) by mouth daily. Patient not taking: Reported on 09/21/2015 02/17/15   Jonetta Osgood, MD  calcium acetate (PHOSLO) 667 MG capsule Take 2 capsules (1,334 mg total) by mouth 3 (three) times daily with meals. Patient not taking: Reported on 09/21/2015 02/17/15   Jonetta Osgood, MD  ferrous sulfate 325 (65 FE) MG tablet Take 1 tablet (325 mg total) by mouth 2 (two) times daily with a meal. Patient not taking: Reported on 09/21/2015 02/17/15   Shanker Kristeen Mans, MD  furosemide (LASIX) 80 MG tablet Take 2 tablets (160 mg total) by mouth 2 (two) times daily. Patient not taking: Reported on 09/21/2015 09/07/15   Tresa Garter, MD  hydrALAZINE (APRESOLINE) 50 MG tablet Take 1 tablet (50 mg total) by mouth every 6 (six) hours. Patient not taking: Reported on 09/21/2015 02/17/15   Jonetta Osgood, MD  mycophenolate (CELLCEPT) 250 MG capsule Take 3 capsules (750 mg total) by mouth 2 (two) times daily. Patient not taking: Reported on 09/21/2015 02/17/15   Jonetta Osgood, MD  sodium bicarbonate 650 MG tablet Take 1 tablet (650 mg total) by mouth 2 (two) times daily. Patient not taking: Reported on 09/21/2015 02/17/15   Jonetta Osgood, MD  tacrolimus (PROGRAF) 1 MG  capsule Take 11 capsules (11 mg total) by mouth 2 (two) times daily. Patient not taking: Reported on 09/21/2015 02/17/15   Jonetta Osgood, MD    Physical Exam: Filed Vitals:   09/21/15 1830 09/21/15 1845 09/21/15 1900 09/21/15 1904  BP: 181/126 177/115 188/117   Pulse:  124 126   Temp:    101 F (38.3 C)  TempSrc:    Oral  Resp:  16 18   SpO2:  95% 99%    General: Obvious discomfort, but no acute distress HEENT:       Eyes: PERRL, EOMI, no scleral icterus or conjunctival pallor.       ENT: No discharge from the ears or nose, no pharyngeal ulcers, petechiae or exudate, no tonsillar enlargement.        Neck: No JVD, no bruit, no appreciable mass Heme: No cervical adenopathy, no pallor Cardiac: S1/S2, rate ~100 and regular with grade III holosystolic murmur, no gallops or rubs. Pulm: Good air movement bilaterally. No rales, wheezing, rhonchi or rubs. Abd:  Soft, nondistended, nontender, no rebound pain or gaurding, no mass or organomegaly, BS present. Ext: No LE edema bilaterally. 2+DP/PT pulse bilaterally. Musculoskeletal: Old AVF site in right forearm, tunneled catheter in right chest c/d/i. No gross deformity, no red, hot, swollen joints, no limitation in ROM  Skin: No rashes or wounds on exposed surfaces  Neuro: Alert, oriented X3, cranial nerves II-XII grossly intact, no focal findings Psych: Patient is not overtly psychotic   Labs on Admission:  Basic Metabolic Panel:  Recent Labs Lab 09/21/15 1556  NA 137  K 4.2  CL 95*  CO2 25  GLUCOSE 83  BUN 12  CREATININE 6.67*  CALCIUM 9.0   Liver Function Tests:  Recent Labs Lab 09/21/15 1556  AST 33  ALT 14*  ALKPHOS 118  BILITOT 1.7*  PROT 8.5*  ALBUMIN 3.5    Recent Labs Lab 09/21/15 1556  LIPASE 141*   No results for input(s): AMMONIA in the last 168 hours. CBC:  Recent Labs Lab 09/21/15 1556  WBC 9.4  HGB 10.6*  HCT 32.8*  MCV 83.7  PLT 97*   Cardiac Enzymes: No results for input(s): CKTOTAL,  CKMB, CKMBINDEX, TROPONINI in the last 168 hours.  BNP (last 3 results)  Recent Labs  02/14/15 1755  BNP 1305.9*    ProBNP (last 3 results)  Recent Labs  02/14/15 1612  PROBNP 22230.00*    CBG: No results for input(s): GLUCAP in the last 168 hours.  Radiological Exams on Admission: Dg Chest 2 View  09/21/2015  CLINICAL DATA:  SOB for 3 days. Cough for 6 days. History of hypertension, dialysis. EXAM: CHEST  2 VIEW COMPARISON:  02/14/2015 FINDINGS: Heart size is upper normal. Right-sided dialysis catheter tip to the upper right atrium. Moderate right pleural effusion is increased. Right basilar opacity obscures the hemidiaphragm. IMPRESSION: Right lower lobe atelectasis or infiltrate and right pleural effusion. Electronically Signed   By: Nolon Nations M.D.   On: 09/21/2015 17:05    EKG: Ordered, not yet performed    Assessment/Plan  1. HCAP  Patient presents with fever and exertional dyspnea with chest x-ray findings suspicious for right lower lobe infiltrate. Patient is on hemodialysis and long-term prednisone and at risk for drug resistant organisms. Organism is unknown at this time and patient will be treated with empiric vancomycin and ceftazidime pending further micro data. So far, Mr. Geier has had adequate O2 saturations on room air, and will be monitored on continuous pulse oximetry. Blood and sputum cultures will be obtained and Legionella and Streptococcus urine antigens will be sent. Pro-calcitonin has been drawn and is pending. Lactic acid has returned within the normal limits 2. Given the patient's end-stage renal disease, IV fluids will be used judiciously. Mr. Blyden had received the flu vaccine this season and influenza PCR is negative.  2. ESRD  Dialyzes Tuesdays Thursdays and Saturdays at the [facility] through a tunneled catheter in the right chest. His last dialysis session, on 09/21/2015, was stopped halfway through given the patient's dyspnea  and fevers. He is not uremic or fluid overloaded on admission and there is no significant electrolyte derangement. The hemodialysis service will be contacted for nonemergent, maintenance dialysis.   3. OSA Patient gives a history of obstructive sleep apnea for which he uses a CPAP at home. CPAP will be continued here daily at bedtime.  4. HTN  Patient is quite hypertensive, but at risk for developing sepsis and dose will be reduced for his home antihypertensives. IV hydralazine  will be available as needed.    DVT ppx: SQ Heparin, SCDs  Code Status: Full code Family Communication: Yes, patient's wife at bed side Disposition Plan: Admit to inpatient   Date of Service 09/21/2015    Ilene Qua Celia Friedland [facility] Pager [phone]  If 7PM-7AM, please contact night-coverage [email or web address] Password [email or web address] 09/21/2015, 7:30 PM

## 2015-09-21 NOTE — ED Notes (Signed)
Will obtain pt vitals when pt quits vomiting.

## 2015-09-22 DIAGNOSIS — G4733 Obstructive sleep apnea (adult) (pediatric): Secondary | ICD-10-CM

## 2015-09-22 DIAGNOSIS — Z992 Dependence on renal dialysis: Secondary | ICD-10-CM

## 2015-09-22 DIAGNOSIS — I10 Essential (primary) hypertension: Secondary | ICD-10-CM

## 2015-09-22 DIAGNOSIS — N186 End stage renal disease: Secondary | ICD-10-CM | POA: Diagnosis not present

## 2015-09-22 DIAGNOSIS — J189 Pneumonia, unspecified organism: Secondary | ICD-10-CM | POA: Diagnosis not present

## 2015-09-22 DIAGNOSIS — R600 Localized edema: Secondary | ICD-10-CM

## 2015-09-22 LAB — COMPREHENSIVE METABOLIC PANEL
ALK PHOS: 84 U/L (ref 38–126)
ALT: 11 U/L — AB (ref 17–63)
ANION GAP: 11 (ref 5–15)
AST: 23 U/L (ref 15–41)
Albumin: 2.5 g/dL — ABNORMAL LOW (ref 3.5–5.0)
BILIRUBIN TOTAL: 1.1 mg/dL (ref 0.3–1.2)
BUN: 19 mg/dL (ref 6–20)
CO2: 26 mmol/L (ref 22–32)
CREATININE: 8.04 mg/dL — AB (ref 0.61–1.24)
Calcium: 8.2 mg/dL — ABNORMAL LOW (ref 8.9–10.3)
Chloride: 99 mmol/L — ABNORMAL LOW (ref 101–111)
GFR calc non Af Amer: 7 mL/min — ABNORMAL LOW (ref >60)
GFR, EST AFRICAN AMERICAN: 8 mL/min — AB (ref >60)
Glucose, Bld: 65 mg/dL (ref 65–99)
Potassium: 4.3 mmol/L (ref 3.5–5.1)
Sodium: 136 mmol/L (ref 135–145)
Total Protein: 6.3 g/dL — ABNORMAL LOW (ref 6.5–8.1)

## 2015-09-22 LAB — CBC WITH DIFFERENTIAL/PLATELET
BASOS PCT: 1 %
Basophils Absolute: 0.1 10*3/uL (ref 0.0–0.1)
EOS PCT: 2 %
Eosinophils Absolute: 0.2 10*3/uL (ref 0.0–0.7)
HEMATOCRIT: 24.9 % — AB (ref 39.0–52.0)
HEMOGLOBIN: 7.8 g/dL — AB (ref 13.0–17.0)
LYMPHS PCT: 24 %
Lymphs Abs: 2 10*3/uL (ref 0.7–4.0)
MCH: 26.2 pg (ref 26.0–34.0)
MCHC: 31.3 g/dL (ref 30.0–36.0)
MCV: 83.6 fL (ref 78.0–100.0)
MONOS PCT: 15 %
Monocytes Absolute: 1.3 10*3/uL — ABNORMAL HIGH (ref 0.1–1.0)
NEUTROS ABS: 4.9 10*3/uL (ref 1.7–7.7)
Neutrophils Relative %: 58 %
Platelets: 71 10*3/uL — ABNORMAL LOW (ref 150–400)
RBC: 2.98 MIL/uL — ABNORMAL LOW (ref 4.22–5.81)
RDW: 20.6 % — ABNORMAL HIGH (ref 11.5–15.5)
WBC: 8.5 10*3/uL (ref 4.0–10.5)

## 2015-09-22 LAB — HIV ANTIBODY (ROUTINE TESTING W REFLEX): HIV Screen 4th Generation wRfx: NONREACTIVE

## 2015-09-22 LAB — INFLUENZA PANEL BY PCR (TYPE A & B)
H1N1 flu by pcr: NOT DETECTED
INFLBPCR: NEGATIVE
Influenza A By PCR: NEGATIVE

## 2015-09-22 LAB — PROCALCITONIN: PROCALCITONIN: 3.6 ng/mL

## 2015-09-22 LAB — MRSA PCR SCREENING: MRSA by PCR: POSITIVE — AB

## 2015-09-22 LAB — LACTIC ACID, PLASMA
LACTIC ACID, VENOUS: 0.8 mmol/L (ref 0.5–2.0)
Lactic Acid, Venous: 0.7 mmol/L (ref 0.5–2.0)

## 2015-09-22 MED ORDER — CALCITRIOL 0.5 MCG PO CAPS
0.5000 ug | ORAL_CAPSULE | ORAL | Status: DC
  Administered 2015-09-23: 0.5 ug via ORAL
  Filled 2015-09-22: qty 1

## 2015-09-22 MED ORDER — CARVEDILOL 12.5 MG PO TABS
12.5000 mg | ORAL_TABLET | Freq: Once | ORAL | Status: AC
  Administered 2015-09-22: 12.5 mg via ORAL
  Filled 2015-09-22: qty 1

## 2015-09-22 MED ORDER — AMLODIPINE BESYLATE 10 MG PO TABS
10.0000 mg | ORAL_TABLET | Freq: Every day | ORAL | Status: DC
  Administered 2015-09-23 – 2015-09-24 (×2): 10 mg via ORAL
  Filled 2015-09-22 (×2): qty 1

## 2015-09-22 MED ORDER — VANCOMYCIN HCL IN DEXTROSE 750-5 MG/150ML-% IV SOLN
750.0000 mg | INTRAVENOUS | Status: DC
  Administered 2015-09-23: 750 mg via INTRAVENOUS
  Filled 2015-09-22 (×2): qty 150

## 2015-09-22 MED ORDER — CARVEDILOL 25 MG PO TABS
25.0000 mg | ORAL_TABLET | Freq: Two times a day (BID) | ORAL | Status: DC
  Administered 2015-09-22 – 2015-09-24 (×4): 25 mg via ORAL
  Filled 2015-09-22 (×4): qty 1

## 2015-09-22 MED ORDER — AMLODIPINE BESYLATE 5 MG PO TABS
5.0000 mg | ORAL_TABLET | Freq: Once | ORAL | Status: AC
  Administered 2015-09-22: 5 mg via ORAL
  Filled 2015-09-22: qty 1

## 2015-09-22 NOTE — Consult Note (Signed)
[facility] Consultation Note Requesting Physician:  Dr. Carles Collet Primary Nephrologist: Dr. Trinda Pascal [facility]) Reason for Consult:  Provision of dialysis and related ESRD services   HPI: The patient is a 49 y.o. year-old AAM (previously followed by [facility], now followed by the [facility]) with a history of ESRD 2/2 HTN, s/p failed renal transplant (2001->07/2015) back on dialysis at the [facility] since October 2016. Other medical problems include HLD, OSA, anemia, secondary hyperparathyroidism and a history of non-compliance.  He currently dialyzes TTS.   He presented to the dialysis unit yesterday with cough, SOB, nausea, vomiting.  BP was 231/146. Completed only 1/2 of his treatment, looked acutely ill, was sent to the hospital (preferred [facility] because lives in [place]).  Here he reported cough, sputum production, low grade fevers, SOB. He was febrile to 101, tachy to 120's, CXR concerning for RLL PNA, procalcitonin elevated at 3.6 He was admitted for treatment of HCAP.- is on vanco and zosyn, with pending cultures and resp virus panel. Influenza PCR negative.   Of note has been off immunosuppressives (failed transplant 2/2 non-compliane with meds) EXCEPT for prednisone, and was out of this med for several days PTA. He receives HD via [facility] and has appt [facility] 10/2015 for permanent access evaluation.      Past Medical History  Diagnosis Date  . Hypertension   . Hyperlipidemia   . Chronic kidney disease   . OSA (obstructive sleep apnea) 03/27/2013  . Blood transfusion without reported diagnosis      Past Surgical History  Procedure Laterality Date  . Kidney transplant       Family History  Problem Relation Age of Onset  . Heart disease Mother   . Cancer Maternal Grandmother   . Cancer Maternal Grandfather   . Asthma Son   . Asthma Son    Social History:  reports that he has never smoked. He has never used smokeless tobacco. He reports that he  does not drink alcohol or use illicit drugs.  Allergies: No Known Allergies  Home medications: Prior to Admission medications   Medication Sig Start Date End Date Taking? Authorizing Provider  amLODipine (NORVASC) 10 MG tablet Take 10 mg by mouth daily.   Yes Historical Provider, MD  carvedilol (COREG) 25 MG tablet Take 25 mg by mouth every 12 (twelve) hours. 08/23/15  Yes Historical Provider, MD  Dextromethorphan-Guaifenesin (CORICIDIN HBP CONGESTION/COUGH) 10-200 MG CAPS Take 1 tablet by mouth daily as needed (cough, congestion).   Yes Historical Provider, MD  hydrALAZINE (APRESOLINE) 25 MG tablet Take 25 mg by mouth every 8 (eight) hours. 08/23/15  Yes Historical Provider, MD  predniSONE (DELTASONE) 5 MG tablet Take 1 tablet (5 mg total) by mouth daily with breakfast. 02/17/15  Yes Shanker Kristeen Mans, MD  pseudoephedrine (SUDAFED) 30 MG tablet Take 30 mg by mouth every 4 (four) hours as needed for congestion.   Yes Historical Provider, MD  sevelamer carbonate (RENVELA) 800 MG tablet Take 1,600 mg by mouth 3 (three) times daily. 08/23/15 08/22/16 Yes Historical Provider, MD  simvastatin (ZOCOR) 5 MG tablet Take 1 tablet (5 mg total) by mouth at bedtime. 02/17/15  Yes Shanker Kristeen Mans, MD  calcitRIOL (ROCALTROL) 0.25 MCG capsule Take 1 capsule (0.25 mcg total) by mouth daily. Patient not taking: Reported on 09/21/2015 02/17/15   Jonetta Osgood, MD  calcium acetate (PHOSLO) 667 MG capsule Take 2 capsules (1,334 mg total) by mouth 3 (three) times daily with meals. Patient not  taking: Reported on 09/21/2015 02/17/15   Jonetta Osgood, MD  ferrous sulfate 325 (65 FE) MG tablet Take 1 tablet (325 mg total) by mouth 2 (two) times daily with a meal. Patient not taking: Reported on 09/21/2015 02/17/15   Shanker Kristeen Mans, MD  furosemide (LASIX) 80 MG tablet Take 2 tablets (160 mg total) by mouth 2 (two) times daily. Patient not taking: Reported on 09/21/2015 09/07/15   Tresa Garter, MD  hydrALAZINE  (APRESOLINE) 50 MG tablet Take 1 tablet (50 mg total) by mouth every 6 (six) hours. Patient not taking: Reported on 09/21/2015 02/17/15   Jonetta Osgood, MD  mycophenolate (CELLCEPT) 250 MG capsule Take 3 capsules (750 mg total) by mouth 2 (two) times daily. Patient not taking: Reported on 09/21/2015 02/17/15   Jonetta Osgood, MD  sodium bicarbonate 650 MG tablet Take 1 tablet (650 mg total) by mouth 2 (two) times daily. Patient not taking: Reported on 09/21/2015 02/17/15   Jonetta Osgood, MD  tacrolimus (PROGRAF) 1 MG capsule Take 11 capsules (11 mg total) by mouth 2 (two) times daily. Patient not taking: Reported on 09/21/2015 02/17/15   Jonetta Osgood, MD    Inpatient medications: . [START ON 09/23/2015] amLODipine  10 mg Oral Daily  . carvedilol  25 mg Oral BID WC  . piperacillin-tazobactam (ZOSYN)  IV  2.25 g Intravenous Q8H  . sevelamer carbonate  1,600 mg Oral TID WC  . simvastatin  5 mg Oral QHS  . sodium chloride  3 mL Intravenous Q12H  . [START ON 09/23/2015] vancomycin  750 mg Intravenous Q T,Th,Sa-HD    Review of Systems See HPI  Physical Exam:  BP 187/108 mmHg  Pulse 119  Temp(Src) 99.4 F (37.4 C) (Oral)  Resp 18  Ht 5' 10"  (1.778 m)  Wt 78.744 kg (173 lb 9.6 oz)  BMI 24.91 kg/m2  SpO2 98%  Gen: Currently not acutely ill appearing Skin: no rash, cyanosis Neck: no JVD Chest: Bilateral coarse rhonchi with exp "squeak" left lower lung field Heart: Dynamic precordium. S1S2 + S4 No S3 R sided TDC intact dressing Abdomen: soft, NT. Old non-functional right renal allograft not tender Ext: No edema. Old R AVF'sX2, R upper AVG, left thigh AVG - all nonfunctional - no s/s infection.  Neuro: alert, Ox3, no focal deficit  Labs:   Recent Labs Lab 09/21/15 1556 09/21/15 1946 09/22/15 0350  NA 137  --  136  K 4.2  --  4.3  CL 95*  --  99*  CO2 25  --  26  GLUCOSE 83  --  65  BUN 12  --  19  CREATININE 6.67* 7.06* 8.04*  CALCIUM 9.0  --  8.2*  PHOS  --  2.5  --       Recent Labs Lab 09/21/15 1556 09/22/15 0350  AST 33 23  ALT 14* 11*  ALKPHOS 118 84  BILITOT 1.7* 1.1  PROT 8.5* 6.3*  ALBUMIN 3.5 2.5*    Recent Labs Lab 09/21/15 1556  LIPASE 141*     Recent Labs Lab 09/21/15 1556 09/21/15 1946 09/22/15 0350  WBC 9.4 7.6 8.5  NEUTROABS  --  5.2 4.9  HGB 10.6* 7.9* 7.8*  HCT 32.8* 25.9* 24.9*  MCV 83.7 84.4 83.6  PLT 97* 71* 71*    Xrays/Other Studies: Dg Chest 2 View  09/21/2015  CLINICAL DATA:  SOB for 3 days. Cough for 6 days. History of hypertension, dialysis. EXAM: CHEST  2 VIEW  COMPARISON:  02/14/2015 FINDINGS: Heart size is upper normal. Right-sided dialysis catheter tip to the upper right atrium. Moderate right pleural effusion is increased. Right basilar opacity obscures the hemidiaphragm. IMPRESSION: Right lower lobe atelectasis or infiltrate and right pleural effusion. Electronically Signed   By: Nolon Nations M.D.   On: 09/21/2015 17:05   Dialysis Prescription [facility] TTS (Dr. Smith Mince) 4 hours 4K 2.25 Ca 400/800 EDW 80 kg  Was under EDW on presentation to HD on 12/6 at 77.3 - and has left under EDW at least once 78.8 12/3 - probably needs lower Access TDC Heparin 5000 bolus Mircera 100 Q2weeks - last dosed 09/16/15 Calcitriol 0.5 mcg TIW  Background: 49 y.o. year-old AAM (previously followed by [facility], now followed by the [facility]) with a history of ESRD 2/2 HTN, s/p failed renal transplant (2001->07/2015) back on dialysis at the [facility] since October 2016. Other medical problems include HLD, OSA, anemia, secondary hyperparathyroidism and a history of non-compliance.  He currently dialyzes TTS. He presented to his dialysis unit 12/6 with cough, SOB, nausea, vomiting.  BP was 231/146. Completed only 1/2 of his treatment, looked acutely ill, was sent to the hospital (preferred [facility] because lives in [place]).  Here he reported cough, sputum production, low grade fevers, SOB. He  was febrile to 101, tachy to 120's, CXR concerning for RLL PNA, procalcitonin elevated at 3.6 He was admitted for treatment of HCAP.- is on vanco and zosyn, with pending cultures and resp virus panel. Influenza PCR negative. We were asked to see to provide HD.  Assessment/Recommendations 1. ESRD - [facility]). Next HD 12/8.  2. Secondary HPT - continue calcitriol with HD 3. Anemia - receives Mircera 100 Q2weeks at HD. Last dosed 09/16/15. Hb outpt 8.1 on 09/16/15. Completed course of outpt Fe 09/16/15. Initial Hb 10.6 prob error (? hemoconcentrated since immed post HD) Subsequent values more in line with most recent outpt Hb. 4. HCAP - vanco/zosyn. Influenza negative. Resp virus panel pending. Cultures pending. Clinically feels better.  5. HTN - usual meds 6. HLD - statin 7. OSA - CPAP 8. Thrombocytopenia - new issue. Last platelet count PTA at the HD unit was 269K on 09/07/15.  9. Failed renal allograft d/t non-compliance  Jamal Maes, MD [place] Pager 09/22/2015, 3:48 PM

## 2015-09-22 NOTE — Progress Notes (Signed)
PROGRESS NOTE  Jonathon Conley WPY:[id number] DOB: Sep 06, 1966 DOA: 09/21/2015 PCP: Pcp Not In System  Brief History 49 year old male with a history of ESRD (T-Th-Sat), OSA, hypertension, hyperlipidemia presented with 4 day history of cough, dyspnea, and fever. In addition, the patient has had nausea and vomiting for the past 2 days. He has had some loose stools without any frank diarrhea. There is no hematochezia or melena. The patient has been using over-the-counter medications without relief. Because of his shortness of breath, cough, fever he was only able to complete 2-4 hours of dialysis on 09/21/2015. He endorses compliance with dialysis. Unfortunately, he has been noncompliant with his immunosuppressive therapy for his renal transplant.  Assessment/Plan: Sepsis -present at time of presentation -secondary to HCAP -follow up blood culture -continue empiric vanco and zosyn -procalcitonin--3.60 -lactic acid 1.7 HCAP -influenza PCR neg -respiratory viral panel -continue vanco and zosyn pending culture data ESRD -consulted nephrology for maintenance HD -Has not taken his immunosuppressive therapy for the last 3 months  Hypertension -Restart carvedilol 25 mg twice a day -Restart amlodipine 10 mg daily OSA -continue CPAP Hyperlipidemia -Continue statin LLE Edema -duplex r/o DVT Thrombocytopenia -Secondary to sepsis -Discontinue heparin -Monitor for signs of bleeding  Family Communication:   Pt at beside Disposition Plan:   Home 2-3 days      Procedures/Studies: Dg Chest 2 View  09/21/2015  CLINICAL DATA:  SOB for 3 days. Cough for 6 days. History of hypertension, dialysis. EXAM: CHEST  2 VIEW COMPARISON:  02/14/2015 FINDINGS: Heart size is upper normal. Right-sided dialysis catheter tip to the upper right atrium. Moderate right pleural effusion is increased. Right basilar opacity obscures the hemidiaphragm. IMPRESSION: Right lower lobe atelectasis or infiltrate and  right pleural effusion. Electronically Signed   By: Nolon Nations M.D.   On: 09/21/2015 17:05         Subjective:  patient still complains of dyspnea with exertion and cough with clear sputum. Denies any hemoptysis. Still having low-grade fevers. Denies any chest pain, vomiting, diarrhea, hematochezia, hematemesis. Denies any headache or neck pain. Denies any rashes or synovitis.   Objective: Filed Vitals:   09/21/15 2100 09/21/15 2119 09/22/15 0527 09/22/15 1000  BP: 193/112 184/107 192/111 187/108  Pulse: 114 111 110 119  Temp:  98.4 F (36.9 C) 99.1 F (37.3 C) 99.4 F (37.4 C)  TempSrc:  Oral Oral Oral  Resp:  14 18 18   Height:  5' 10"  (1.778 m)    Weight:  78.744 kg (173 lb 9.6 oz)    SpO2: 97% 93% 96% 98%    Intake/Output Summary (Last 24 hours) at 09/22/15 1201 Last data filed at 09/22/15 0900  Gross per 24 hour  Intake    530 ml  Output      0 ml  Net    530 ml   Weight change:  Exam:   General:  Pt is alert, follows commands appropriately, not in acute distress  HEENT: No icterus, No thrush, No neck mass, [place]/AT  Cardiovascular: RRR, S1/S2, no rubs, no gallops  Respiratory: bibasilar crackles. Diminished breath sound right base.   Abdomen: Soft/+BS, non tender, non distended, no guarding  Extremities: 1 + LE edema L>R, No lymphangitis, No petechiae, No rashes, no synovitis  Data Reviewed: Basic Metabolic Panel:  Recent Labs Lab 09/21/15 1556 09/21/15 1946 09/22/15 0350  NA 137  --  136  K 4.2  --  4.3  CL 95*  --  99*  CO2 25  --  26  GLUCOSE 83  --  65  BUN 12  --  19  CREATININE 6.67* 7.06* 8.04*  CALCIUM 9.0  --  8.2*  MG  --  1.7  --   PHOS  --  2.5  --    Liver Function Tests:  Recent Labs Lab 09/21/15 1556 09/22/15 0350  AST 33 23  ALT 14* 11*  ALKPHOS 118 84  BILITOT 1.7* 1.1  PROT 8.5* 6.3*  ALBUMIN 3.5 2.5*    Recent Labs Lab 09/21/15 1556  LIPASE 141*   No results for input(s): AMMONIA in the last 168  hours. CBC:  Recent Labs Lab 09/21/15 1556 09/21/15 1946 09/22/15 0350  WBC 9.4 7.6 8.5  NEUTROABS  --  5.2 4.9  HGB 10.6* 7.9* 7.8*  HCT 32.8* 25.9* 24.9*  MCV 83.7 84.4 83.6  PLT 97* 71* 71*   Cardiac Enzymes: No results for input(s): CKTOTAL, CKMB, CKMBINDEX, TROPONINI in the last 168 hours. BNP: Invalid input(s): POCBNP CBG: No results for input(s): GLUCAP in the last 168 hours.  Recent Results (from the past 240 hour(s))  Culture, sputum-assessment     Status: None   Collection Time: 09/21/15  7:36 PM  Result Value Ref Range Status   Specimen Description SPUTUM  Final   Special Requests NONE  Final   Sputum evaluation   Final    MICROSCOPIC FINDINGS SUGGEST THAT THIS SPECIMEN IS NOT REPRESENTATIVE OF LOWER RESPIRATORY SECRETIONS. PLEASE RECOLLECT. Trudie Buckler RN [phone] 09/21/15 A BROWNING    Report Status 09/21/2015 FINAL  Final  MRSA PCR Screening     Status: Abnormal   Collection Time: 09/22/15  6:07 AM  Result Value Ref Range Status   MRSA by PCR POSITIVE (A) NEGATIVE Final    Comment:        The GeneXpert MRSA Assay (FDA approved for NASAL specimens only), is one component of a comprehensive MRSA colonization surveillance program. It is not intended to diagnose MRSA infection nor to guide or monitor treatment for MRSA infections. RESULT CALLED TO, READ BACK BY AND VERIFIED WITH: Grayland Jack RN 9:35 09/22/15 ([email or web address])      Scheduled Meds: . amLODipine  5 mg Oral Daily  . carvedilol  12.5 mg Oral Once  . carvedilol  25 mg Oral BID WC  . heparin  5,000 Units Subcutaneous 3 times per day  . piperacillin-tazobactam (ZOSYN)  IV  2.25 g Intravenous Q8H  . sevelamer carbonate  1,600 mg Oral TID WC  . simvastatin  5 mg Oral QHS  . sodium chloride  3 mL Intravenous Q12H  . [START ON 09/23/2015] vancomycin  1,000 mg Intravenous Q T,Th,Sa-HD   Continuous Infusions:    Jonathon Rigdon, DO  [facility] Pager [phone]  If 7PM-7AM, please contact  night-coverage [email or web address] Password [email or web address] 09/22/2015, 12:01 PM   LOS: 1 day

## 2015-09-22 NOTE — Evaluation (Signed)
Physical Therapy Evaluation Patient Details Name: Jonathon Conley MRN: [id number] DOB: 04-21-1966 Today's Date: 09/22/2015   History of Present Illness  Jonathon Conley is a 49 y.o. male with PMH of end-stage renal disease on hemodialysis Tuesday Thursday and Saturday, OSA, and anemia of chronic kidney disease who presents to the ED with fever and dyspnea 4 of days duration.  Clinical Impression  Pt admitted with the above complications. Pt currently with functional limitations due to the deficits listed below (see PT Problem List). BP trending high, last noted at 187/108. Limited PT comprehensive evaluation due to departmental guidelines. Modified independent with stand pivot transfer to chair. Works in Engineer, technical sales. Independent PTA. Anticipate he will ambulate quite well when BP stable. Will follow up for further assessment.  Pt will benefit from skilled PT to increase their independence and safety with mobility to allow discharge to the venue listed below.       Follow Up Recommendations No PT follow up    Equipment Recommendations  None recommended by PT    Recommendations for Other Services       Precautions / Restrictions Precautions Precautions: None Restrictions Weight Bearing Restrictions: No      Mobility  Bed Mobility Overal bed mobility: Independent                Transfers Overall transfer level: Modified independent Equipment used: None Transfers: Sit to/from [employer] Transfers Sit to Stand: Modified independent (Device/Increase time) Stand pivot transfers: Modified independent (Device/Increase time)       General transfer comment: extra time. No cues or physical assist required.  Ambulation/Gait             General Gait Details: Deferred due to elevated BP  Stairs            Wheelchair Mobility    Modified Rankin (Stroke Patients Only)       Balance Overall balance assessment: Modified Independent                                           Pertinent Vitals/Pain Pain Assessment: 0-10 Pain Score: 8  Pain Location: Rt lower ribs Pain Descriptors / Indicators: Spasm Pain Intervention(s): Monitored during session;Repositioned;Limited activity within patient's tolerance    Home Living Family/patient expects to be discharged to:: Private residence Living Arrangements: Spouse/significant other Available Help at Discharge: Family Type of Home: House Home Access: Stairs to enter Entrance Stairs-Rails: None Technical brewer of Steps: 3 Home Layout: One level Home Equipment: Civil engineer, contracting      Prior Function Level of Independence: Independent         Comments: Works in [place] Hand: Left    Extremity/Trunk Assessment   Upper Extremity Assessment: Defer to OT evaluation           Lower Extremity Assessment: Overall WFL for tasks assessed         Communication   Communication: No difficulties  Cognition Arousal/Alertness: Awake/alert Behavior During Therapy: WFL for tasks assessed/performed Overall Cognitive Status: Within Functional Limits for tasks assessed                      General Comments General comments (skin integrity, edema, etc.): Couging at times. Complains of Rt lower rib spasms    Exercises General Exercises - Lower Extremity Ankle Circles/Pumps: AROM;Both;10 reps;Seated Quad Sets:  Strengthening;Both;5 reps;Seated      Assessment/Plan    PT Assessment Patient needs continued PT services  PT Diagnosis Acute pain   PT Problem List Decreased activity tolerance;Decreased mobility;Pain  PT Treatment Interventions Gait training;Functional mobility training;Stair training;Therapeutic activities;Therapeutic exercise;Balance training;Patient/family education   PT Goals (Current goals can be found in the Care Plan section) Acute Rehab PT Goals Patient Stated Goal: None stated PT Goal Formulation: With patient Time For Goal  Achievement: 10/06/15 Potential to Achieve Goals: Good    Frequency Min 3X/week   Barriers to discharge        Co-evaluation               End of Session   Activity Tolerance: Patient tolerated treatment well;Other (comment) (Limited assessement due to elevated BP) Patient left: in chair;with call bell/phone within reach Nurse Communication: Mobility status         Time: 9179-2178 PT Time Calculation (min) (ACUTE ONLY): 10 min   Charges:   PT Evaluation $Initial PT Evaluation Tier I: 1 Procedure     PT G CodesEllouise Newer 09/22/2015, 11:45 AM Elayne Snare, [place]

## 2015-09-22 NOTE — Progress Notes (Signed)
ANTIBIOTIC CONSULT NOTE - FOLLOW UP  Pharmacy Consult for Vancomycin and Zosyn Indication: rule out pneumonia  No Known Allergies  Patient Measurements: Height: 5' 10"  (177.8 cm) Weight: 173 lb 9.6 oz (78.744 kg) IBW/kg (Calculated) : 73  Vital Signs: Temp: 99.4 F (37.4 C) (12/07 1000) Temp Source: Oral (12/07 1000) BP: 187/108 mmHg (12/07 1000) Pulse Rate: 119 (12/07 1000)  Labs:  Recent Labs  09/21/15 1556 09/21/15 1946 09/22/15 0350  WBC 9.4 7.6 8.5  HGB 10.6* 7.9* 7.8*  PLT 97* 71* 71*  CREATININE 6.67* 7.06* 8.04*  *ESRD*  Microbiology: Abx:  Vanc 12/6>> Zosyn 12/6>>  Cx data: 12/6 blood: px 12/6 sputum: not acceptable collection 12/6: Influenza: negative 12/6: HIV (rapid): non reactive 12/7: MRSA by PCR positive   Assessment: 49yoM presented to ER with complaints of cough, dyspnea and fever. In the ER was found to have LA of 1.73, PCT 3.60 , tmax 101 and x-ray concerning for RLL infiltrate vs atelectasis. ESRD normally on TTHSAT with last IHD on 09/21/15 but was only able to complete 2 hour session.  Goal of Therapy:  pre HD vancomycin level of 15-25  treatment of infection  Plan:  1. Received 2 gram LD of vancomycin on 12/6; will adjust post IHD dose of vancomycin to 750 mg on TTHSAT as weight has changed since admission 2. Continue Zosyn 2.25 gram q8H (infused over 30 minutes) 3. Await cx data and adjust abx as needed 4. Follow any potential changes to IHD schedule and adjust administration of anti-infective's as needed  Vincenza Hews, PharmD, BCPS 09/22/2015, 12:51 PM Pager: [phone]

## 2015-09-23 ENCOUNTER — Inpatient Hospital Stay (HOSPITAL_BASED_OUTPATIENT_CLINIC_OR_DEPARTMENT_OTHER): Payer: PRIVATE HEALTH INSURANCE

## 2015-09-23 DIAGNOSIS — Z992 Dependence on renal dialysis: Secondary | ICD-10-CM | POA: Diagnosis not present

## 2015-09-23 DIAGNOSIS — A419 Sepsis, unspecified organism: Secondary | ICD-10-CM | POA: Diagnosis not present

## 2015-09-23 DIAGNOSIS — R600 Localized edema: Secondary | ICD-10-CM

## 2015-09-23 DIAGNOSIS — G4733 Obstructive sleep apnea (adult) (pediatric): Secondary | ICD-10-CM | POA: Diagnosis not present

## 2015-09-23 DIAGNOSIS — J189 Pneumonia, unspecified organism: Secondary | ICD-10-CM | POA: Diagnosis not present

## 2015-09-23 DIAGNOSIS — N186 End stage renal disease: Secondary | ICD-10-CM | POA: Diagnosis not present

## 2015-09-23 LAB — CBC
HEMATOCRIT: 23.9 % — AB (ref 39.0–52.0)
Hemoglobin: 7.7 g/dL — ABNORMAL LOW (ref 13.0–17.0)
MCH: 26.7 pg (ref 26.0–34.0)
MCHC: 32.2 g/dL (ref 30.0–36.0)
MCV: 83 fL (ref 78.0–100.0)
PLATELETS: 102 10*3/uL — AB (ref 150–400)
RBC: 2.88 MIL/uL — AB (ref 4.22–5.81)
RDW: 20.4 % — ABNORMAL HIGH (ref 11.5–15.5)
WBC: 4.9 10*3/uL (ref 4.0–10.5)

## 2015-09-23 LAB — RENAL FUNCTION PANEL
ANION GAP: 13 (ref 5–15)
Albumin: 2.6 g/dL — ABNORMAL LOW (ref 3.5–5.0)
BUN: 24 mg/dL — ABNORMAL HIGH (ref 6–20)
CHLORIDE: 98 mmol/L — AB (ref 101–111)
CO2: 27 mmol/L (ref 22–32)
Calcium: 8.4 mg/dL — ABNORMAL LOW (ref 8.9–10.3)
Creatinine, Ser: 10.66 mg/dL — ABNORMAL HIGH (ref 0.61–1.24)
GFR, EST AFRICAN AMERICAN: 6 mL/min — AB (ref >60)
GFR, EST NON AFRICAN AMERICAN: 5 mL/min — AB (ref >60)
Glucose, Bld: 98 mg/dL (ref 65–99)
POTASSIUM: 3.9 mmol/L (ref 3.5–5.1)
Phosphorus: 3.9 mg/dL (ref 2.5–4.6)
Sodium: 138 mmol/L (ref 135–145)

## 2015-09-23 LAB — RESPIRATORY VIRUS PANEL
Adenovirus: NEGATIVE
INFLUENZA A: NEGATIVE
INFLUENZA B 1: NEGATIVE
Metapneumovirus: NEGATIVE
PARAINFLUENZA 3 A: NEGATIVE
Parainfluenza 1: NEGATIVE
Parainfluenza 2: NEGATIVE
RESPIRATORY SYNCYTIAL VIRUS A: NEGATIVE
RESPIRATORY SYNCYTIAL VIRUS B: POSITIVE — AB
Rhinovirus: NEGATIVE

## 2015-09-23 LAB — HEPATITIS B SURFACE ANTIGEN: HEP B S AG: NEGATIVE

## 2015-09-23 MED ORDER — DEXTROSE 5 % IV SOLN
2.0000 g | Freq: Once | INTRAVENOUS | Status: AC
  Administered 2015-09-24: 2 g via INTRAVENOUS
  Filled 2015-09-23: qty 2

## 2015-09-23 MED ORDER — HYDRALAZINE HCL 25 MG PO TABS
25.0000 mg | ORAL_TABLET | Freq: Three times a day (TID) | ORAL | Status: DC
  Administered 2015-09-23 – 2015-09-24 (×3): 25 mg via ORAL
  Filled 2015-09-23 (×3): qty 1

## 2015-09-23 MED ORDER — MUPIROCIN 2 % EX OINT
1.0000 | TOPICAL_OINTMENT | Freq: Two times a day (BID) | CUTANEOUS | Status: DC
Start: 2015-09-23 — End: 2015-09-24
  Administered 2015-09-23 – 2015-09-24 (×3): 1 via NASAL
  Filled 2015-09-23 (×2): qty 22

## 2015-09-23 MED ORDER — CHLORHEXIDINE GLUCONATE CLOTH 2 % EX PADS
6.0000 | MEDICATED_PAD | Freq: Every day | CUTANEOUS | Status: DC
  Administered 2015-09-24: 6 via TOPICAL

## 2015-09-23 MED ORDER — DEXTROSE 5 % IV SOLN: 2.0000 g | INTRAVENOUS | Status: DC

## 2015-09-23 NOTE — Procedures (Signed)
I have personally attended this patient's dialysis session.   Pt is feeling better Pre HD weight well under his EDW at 76.5 kg Set for 2 liters but starting to cramp some. Will have a lower EDW at discharge [facility] at 400 Changed to 4K 2.25 Ca bath  (K of 3.9) Hb 7.7  Jamal Maes, MD [facility] [phone] Pager 09/23/2015, 10:53 AM

## 2015-09-23 NOTE — Progress Notes (Signed)
Pt has refused CPAP for the night.  RT to monitor and assess as needed.

## 2015-09-23 NOTE — Progress Notes (Signed)
PT Cancellation Note  Patient Details Name: Jonathon Conley MRN: [id number] DOB: 03-Mar-1966   Cancelled Treatment:    Reason Eval/Treat Not Completed: Medical issues which prohibited therapy  Patient's BP is 190/111. Per departmental guidelines, pt not appropriate for physical therapy at this time. Will check back tomorrow. Anticipate he will do very well based on limited evaluation 09/22/15.  Ellouise Newer 09/23/2015, 3:26 PM  Camille Bal [place], [place]

## 2015-09-23 NOTE — Progress Notes (Signed)
VASCULAR LAB PRELIMINARY  PRELIMINARY  PRELIMINARY  PRELIMINARY  Left lower extremity venous duplex completed.    Preliminary report:  There is no DVT or SVT noted in the left lower extremity.  Dialysis access appears WNL.  Malakye Nolden, RVT 09/23/2015, 4:02 PM

## 2015-09-23 NOTE — Progress Notes (Signed)
RT spoke with pt at beginning of shift regarding use of CPAP while in hospital.  Patient agreeable at that time.  Upon returning at requested time, patient refused CPAP use.  RT reiterated the importance of usage, but patient continued to refuse.  Patient advised to use call bell if he changes his mind.

## 2015-09-23 NOTE — Progress Notes (Signed)
PROGRESS NOTE  Jonathon Conley NBV:[id number] DOB: 19-Mar-1966 DOA: 09/21/2015 PCP: Pcp Not In System  Brief History 49 year old male with a history of ESRD (T-Th-Sat), OSA, hypertension, hyperlipidemia presented with 4 day history of cough, dyspnea, and fever. In addition, the patient has had nausea and vomiting for the past 2 days. He has had some loose stools without any frank diarrhea. There is no hematochezia or melena. The patient has been using over-the-counter medications without relief. Because of his shortness of breath, cough, fever he was only able to complete 2-4 hours of dialysis on 09/21/2015. He endorses compliance with dialysis. Unfortunately, he has been noncompliant with his immunosuppressive therapy for his renal transplant.  Assessment/Plan: Sepsis -present at time of presentation -secondary to HCAP -follow up blood culture--NGTD -continue empiric IV abx -procalcitonin--3.60 -lactic acid 1.7 HCAP -influenza PCR neg -respiratory viral panel--pending -continue vanco  -dc zosyn; start ceftazidime ESRD -consulted nephrology for maintenance HD -Has not taken his immunosuppressive therapy for the last 3 months  Hypertension -Restart carvedilol 25 mg twice a day -Restart amlodipine 10 mg daily -add hydralazine 25 mg tid OSA -continue CPAP Hyperlipidemia -Continue statin LLE Edema -duplex r/o DVT--neg Thrombocytopenia -Secondary to sepsis -Discontinue heparin -Monitor for signs of bleeding  Family Communication: Pt at beside Disposition Plan: Home 12/9 or 12/10    Procedures/Studies: Dg Chest 2 View  09/21/2015  CLINICAL DATA:  SOB for 3 days. Cough for 6 days. History of hypertension, dialysis. EXAM: CHEST  2 VIEW COMPARISON:  02/14/2015 FINDINGS: Heart size is upper normal. Right-sided dialysis catheter tip to the upper right atrium. Moderate right pleural effusion is increased. Right basilar opacity obscures the hemidiaphragm. IMPRESSION: Right  lower lobe atelectasis or infiltrate and right pleural effusion. Electronically Signed   By: Nolon Nations M.D.   On: 09/21/2015 17:05         Subjective: Overall, the patient is feeling better. He states that his shortness of breath is improving. He states that his cough is about the same. Denies any fevers, chills, chest pain, nausea, vomiting, diarrhea, abdominal pain. No dysuria or hematuria. No rashes.  Objective: Filed Vitals:   09/23/15 1109 09/23/15 1130 09/23/15 1215 09/23/15 1726  BP: 174/122 189/116 190/111 186/106  Pulse: 114 110 114 101  Temp:  98.5 F (36.9 C) 98.8 F (37.1 C) 98.8 F (37.1 C)  TempSrc:  Oral Oral Oral  Resp:  16 18 20   Height:      Weight:  74.6 kg (164 lb 7.4 oz)    SpO2:  100% 98% 99%    Intake/Output Summary (Last 24 hours) at 09/23/15 1815 Last data filed at 09/23/15 1700  Gross per 24 hour  Intake    940 ml  Output   1649 ml  Net   -709 ml   Weight change: 1.179 kg (2 lb 9.6 oz) Exam:   General:  Pt is alert, follows commands appropriately, not in acute distress  HEENT: No icterus, No thrush, No neck mass, [place]/AT  Cardiovascular: RRR, S1/S2, no rubs, no gallops  Respiratory: Bibasilar rales, right greater than left. No wheezing. Good air movement  Abdomen: Soft/+BS, non tender, non distended, no guarding; no hepatosplenomegaly  Extremities: No edema, No lymphangitis, No petechiae, No rashes, no synovitis  Data Reviewed: Basic Metabolic Panel:  Recent Labs Lab 09/21/15 1556 09/21/15 1946 09/22/15 0350 09/23/15 0745  NA 137  --  136 138  K 4.2  --  4.3 3.9  CL 95*  --  99* 98*  CO2 25  --  26 27  GLUCOSE 83  --  65 98  BUN 12  --  19 24*  CREATININE 6.67* 7.06* 8.04* 10.66*  CALCIUM 9.0  --  8.2* 8.4*  MG  --  1.7  --   --   PHOS  --  2.5  --  3.9   Liver Function Tests:  Recent Labs Lab 09/21/15 1556 09/22/15 0350 09/23/15 0745  AST 33 23  --   ALT 14* 11*  --   ALKPHOS 118 84  --   BILITOT 1.7* 1.1   --   PROT 8.5* 6.3*  --   ALBUMIN 3.5 2.5* 2.6*    Recent Labs Lab 09/21/15 1556  LIPASE 141*   No results for input(s): AMMONIA in the last 168 hours. CBC:  Recent Labs Lab 09/21/15 1556 09/21/15 1946 09/22/15 0350 09/23/15 0745  WBC 9.4 7.6 8.5 4.9  NEUTROABS  --  5.2 4.9  --   HGB 10.6* 7.9* 7.8* 7.7*  HCT 32.8* 25.9* 24.9* 23.9*  MCV 83.7 84.4 83.6 83.0  PLT 97* 71* 71* 102*   Cardiac Enzymes: No results for input(s): CKTOTAL, CKMB, CKMBINDEX, TROPONINI in the last 168 hours. BNP: Invalid input(s): POCBNP CBG: No results for input(s): GLUCAP in the last 168 hours.  Recent Results (from the past 240 hour(s))  Blood culture (routine x 2)     Status: None (Preliminary result)   Collection Time: 09/21/15  6:17 PM  Result Value Ref Range Status   Specimen Description BLOOD LEFT HAND  Final   Special Requests IN PEDIATRIC BOTTLE 3CC  Final   Culture NO GROWTH 2 DAYS  Final   Report Status PENDING  Incomplete  Culture, sputum-assessment     Status: None   Collection Time: 09/21/15  7:36 PM  Result Value Ref Range Status   Specimen Description SPUTUM  Final   Special Requests NONE  Final   Sputum evaluation   Final    MICROSCOPIC FINDINGS SUGGEST THAT THIS SPECIMEN IS NOT REPRESENTATIVE OF LOWER RESPIRATORY SECRETIONS. PLEASE RECOLLECT. Trudie Buckler RN [phone] 09/21/15 A BROWNING    Report Status 09/21/2015 FINAL  Final  Blood culture (routine x 2)     Status: None (Preliminary result)   Collection Time: 09/21/15  7:46 PM  Result Value Ref Range Status   Specimen Description BLOOD LEFT HAND  Final   Special Requests BOTTLES DRAWN AEROBIC AND ANAEROBIC 5CC  Final   Culture NO GROWTH 2 DAYS  Final   Report Status PENDING  Incomplete  MRSA PCR Screening     Status: Abnormal   Collection Time: 09/22/15  6:07 AM  Result Value Ref Range Status   MRSA by PCR POSITIVE (A) NEGATIVE Final    Comment:        The GeneXpert MRSA Assay (FDA approved for NASAL  specimens only), is one component of a comprehensive MRSA colonization surveillance program. It is not intended to diagnose MRSA infection nor to guide or monitor treatment for MRSA infections. RESULT CALLED TO, READ BACK BY AND VERIFIED WITH: Grayland Jack RN 9:35 09/22/15 ([email or web address])      Scheduled Meds: . amLODipine  10 mg Oral Daily  . calcitRIOL  0.5 mcg Oral Q T,Th,Sa-HD  . carvedilol  25 mg Oral BID WC  . [START ON 09/24/2015] Chlorhexidine Gluconate Cloth  6 each Topical Q0600  . hydrALAZINE  25 mg Oral 3 times per day  .  mupirocin ointment  1 application Nasal BID  . piperacillin-tazobactam (ZOSYN)  IV  2.25 g Intravenous Q8H  . sevelamer carbonate  1,600 mg Oral TID WC  . simvastatin  5 mg Oral QHS  . sodium chloride  3 mL Intravenous Q12H  . vancomycin  750 mg Intravenous Q T,Th,Sa-HD   Continuous Infusions:    Mikaili Flippin, DO  [facility] Pager [phone]  If 7PM-7AM, please contact night-coverage [email or web address] Password [email or web address] 09/23/2015, 6:15 PM   LOS: 2 days

## 2015-09-23 NOTE — Progress Notes (Signed)
Utilization review completed. Blia Totman, RN, BSN. 

## 2015-09-24 DIAGNOSIS — A419 Sepsis, unspecified organism: Secondary | ICD-10-CM | POA: Diagnosis not present

## 2015-09-24 DIAGNOSIS — N186 End stage renal disease: Secondary | ICD-10-CM | POA: Diagnosis not present

## 2015-09-24 DIAGNOSIS — Z992 Dependence on renal dialysis: Secondary | ICD-10-CM | POA: Diagnosis not present

## 2015-09-24 DIAGNOSIS — J189 Pneumonia, unspecified organism: Secondary | ICD-10-CM | POA: Diagnosis not present

## 2015-09-24 LAB — PROCALCITONIN: PROCALCITONIN: 4.18 ng/mL

## 2015-09-24 MED ORDER — DEXTROSE 5 % IV SOLN: 2.0000 g | INTRAVENOUS | Status: DC

## 2015-09-24 MED ORDER — HYDRALAZINE HCL 50 MG PO TABS: 50.0000 mg | ORAL_TABLET | Freq: Three times a day (TID) | ORAL | Status: DC

## 2015-09-24 MED ORDER — VANCOMYCIN HCL IN DEXTROSE 750-5 MG/150ML-% IV SOLN: 750.0000 mg | INTRAVENOUS | Status: DC

## 2015-09-24 MED ORDER — CALCITRIOL 0.5 MCG PO CAPS: 0.5000 ug | ORAL_CAPSULE | ORAL | Status: DC

## 2015-09-24 NOTE — Progress Notes (Signed)
[facility] Rounding Note  Objective:  Feeling much better. No oxygen requirement. Had HD yesterday and weight down to 74.6 lb post HD - new EDW for him Says "I might go home today"  Vital signs in last 24 hours: Filed Vitals:   09/23/15 1215 09/23/15 1726 09/23/15 2202 09/24/15 0614  BP: 190/111 186/106 151/88 176/109  Pulse: 114 101 98 99  Temp: 98.8 F (37.1 C) 98.8 F (37.1 C) 99.5 F (37.5 C) 98.4 F (36.9 C)  TempSrc: Oral Oral Oral Oral  Resp: 18 20 18 17   Height:      Weight:   74.6 kg (164 lb 7.4 oz)   SpO2: 98% 99% 100% 97%    Physical Exam:  Blood pressure 176/109, pulse 99, temperature 98.4 F (36.9 C), temperature source Oral, resp. rate 17, height 5' 10"  (1.778 m), weight 74.6 kg (164 lb 7.4 oz), SpO2 97 %. Gen: Currently not acutely ill appearing - sitting in the chair by the window Neck: no JVD Chest: Breath sounds coarse, stil some rhonchi Heart: 1S2 + S4 No S3 R sided TDC intact dressing Abdomen: soft, NT. Old non-functional right renal allograft not tender Ext: No edema. Old R AVF'sX2, R upper AVG, left thigh AVG - all nonfunctional - no s/s infection  Labs:   Recent Labs Lab 09/21/15 1556 09/21/15 1946 09/22/15 0350 09/23/15 0745  NA 137  --  136 138  K 4.2  --  4.3 3.9  CL 95*  --  99* 98*  CO2 25  --  26 27  GLUCOSE 83  --  65 98  BUN 12  --  19 24*  CREATININE 6.67* 7.06* 8.04* 10.66*  CALCIUM 9.0  --  8.2* 8.4*  PHOS  --  2.5  --  3.9     Recent Labs Lab 09/21/15 1556 09/22/15 0350 09/23/15 0745  AST 33 23  --   ALT 14* 11*  --   ALKPHOS 118 84  --   BILITOT 1.7* 1.1  --   PROT 8.5* 6.3*  --   ALBUMIN 3.5 2.5* 2.6*    Recent Labs Lab 09/21/15 1556  LIPASE 141*     Recent Labs Lab 09/21/15 1556 09/21/15 1946 09/22/15 0350 09/23/15 0745  WBC 9.4 7.6 8.5 4.9  NEUTROABS  --  5.2 4.9  --   HGB 10.6* 7.9* 7.8* 7.7*  HCT 32.8* 25.9* 24.9* 23.9*  MCV 83.7 84.4 83.6 83.0  PLT 97* 71* 71* 102*     Medications:   . amLODipine  10 mg Oral Daily  . calcitRIOL  0.5 mcg Oral Q T,Th,Sa-HD  . carvedilol  25 mg Oral BID WC  . [START ON 09/25/2015] cefTAZidime (FORTAZ)  IV  2 g Intravenous Q T,Th,Sa-HD  . Chlorhexidine Gluconate Cloth  6 each Topical Q0600  . hydrALAZINE  25 mg Oral 3 times per day  . mupirocin ointment  1 application Nasal BID  . sevelamer carbonate  1,600 mg Oral TID WC  . simvastatin  5 mg Oral QHS  . sodium chloride  3 mL Intravenous Q12H  . vancomycin  750 mg Intravenous Q T,Th,Sa-HD   Dialysis Prescription [facility] TTS (Dr. Smith Mince) 4 hours 4K 2.25 Ca 400/800 EDW 80 kg New EDW for discharge 74.5 kg Access R TDC Heparin 5000 bolus Mircera 100 Q2weeks - last dosed 09/16/15 Calcitriol 0.5 mcg TIW  Assessment/Recommendations 1. ESRD - TTS [place]). Next HD 12/10. NEW EDW 74.5 kg 2. Secondary HPT -  continue calcitriol with HD 3. Anemia - receives Mircera 100 Q2weeks at HD. Last dosed 09/16/15. Hb outpt 8.1 on 09/16/15. Completed course of outpt Fe 09/16/15. Initial Hb 10.6 prob error (? hemoconcentrated since immed post HD) Subsequent values more in line with most recent outpt Hb. 4. HCAP - vanco/zosyn. Influenza negative. RSV+ bacterial cultures pending. Clinically feels better.  5. HTN - usual meds 6. HLD - statin 7. OSA - CPAP 8. Thrombocytopenia - new issue. Last platelet count PTA at the HD unit was 269K on 09/07/15. Improving  9. Failed renal allograft d/t non-compliance 10. MRSA PCR + 11. Disposition: for possible discharge to home today  Jamal Maes, MD [place] Pager 09/24/2015, 9:18 AM

## 2015-09-24 NOTE — Progress Notes (Signed)
Telemetry removed and PIV removed.  Discharge instructions discussed with patient and his wife.  All questions answered.  Patient stated he did not need any prescriptions.  He did take his cell phone and clothes.  Patient discharged home.  [employer] RN-BC, WTA.

## 2015-09-24 NOTE — Discharge Summary (Signed)
Physician Discharge Summary  Cayleb Jarnigan WER:[id number] DOB: [birth year] DOA: 09/21/2015  PCP: Pcp Not In System  Admit date: 09/21/2015 Discharge date: 09/24/2015  Recommendations for Outpatient Follow-up:  1. Pt will need to follow up with PCP in 2 weeks post discharge 2. Please give one final dose vancomycin 750 mg and ceftazidime 2 g on dialysis on 09/25/2015 Discharge Diagnoses:  Sepsis -present at time of presentation -secondary to HCAP -follow up blood culture--NGTD -continue empiric IV abx--will finish one week of intravenous antibiotics -procalcitonin--3.60 -lactic acid 1.7 HCAP -influenza PCR neg -respiratory viral panel--pending -continue vanco--one final dose on dialysis 09/25/2015 -dc zosyn; start ceftazidime--one final dose 09/25/2015 -respiratory viral panel +RSV ESRD -consulted nephrology for maintenance HD -Has not taken his immunosuppressive therapy for the last 3 months  Hypertension -Restart carvedilol 25 mg twice a day -Restart amlodipine 10 mg daily -increase hydralazine 50 mg tid OSA -continue CPAP Hyperlipidemia -Continue statin LLE Edema  -duplex r/o DVT--neg Thrombocytopenia -Secondary to sepsis -Discontinue heparin -Monitor for signs of bleeding -Platelets improved with treatment of the patient's infectious process  Discharge Condition: Stable   Disposition:  home   Diet:renal with 1200 cc fluid restriction Wt Readings from Last 3 Encounters:  09/23/15 74.6 kg (164 lb 7.4 oz)  02/16/15 103.738 kg (228 lb 11.2 oz)  02/14/15 102.513 kg (226 lb)    History of present illness:  49 year old male with a history of ESRD (T-Th-Sat), OSA, hypertension, hyperlipidemia presented with 4 day history of cough, dyspnea, and fever. In addition, the patient has had nausea and vomiting for the past 2 days. He has had some loose stools without any frank diarrhea. There is no hematochezia or melena. The patient has been using over-the-counter medications  without relief. Because of his shortness of breath, cough, fever he was only able to complete 2 of 4 hours of dialysis on 09/21/2015. He endorses compliance with dialysis. Unfortunately, he has been noncompliant with his immunosuppressive therapy for his renal transplant. The patient was started on intravenous antibiotics. His blood cultures remain negative. The patient's tachycardia and fevers improved. His antihypertensive regimen was adjusted. The patient was weaned off of oxygen. Nephrology was consulted for the patient's dialysis needs. The patient will be discharged with one final dose of antibiotics on dialysis on 09/25/2015.  Consultants: Nephrology   Discharge Exam: Filed Vitals:   09/23/15 2202 09/24/15 0614  BP: 151/88 176/109  Pulse: 98 99  Temp: 99.5 F (37.5 C) 98.4 F (36.9 C)  Resp: 18 17   Filed Vitals:   09/23/15 1215 09/23/15 1726 09/23/15 2202 09/24/15 0614  BP: 190/111 186/106 151/88 176/109  Pulse: 114 101 98 99  Temp: 98.8 F (37.1 C) 98.8 F (37.1 C) 99.5 F (37.5 C) 98.4 F (36.9 C)  TempSrc: Oral Oral Oral Oral  Resp: 18 20 18 17   Height:      Weight:   74.6 kg (164 lb 7.4 oz)   SpO2: 98% 99% 100% 97%   General: A&O x 3, NAD, pleasant, cooperative Cardiovascular: RRR, no rub, no gallop, no S3 Respiratory: bibasilar rales. No wheezing  Abdomen:soft, nontender, nondistended, positive bowel sounds Extremities: No edema, No lymphangitis, no petechiae  Discharge Instructions     Medication List    STOP taking these medications        calcium acetate 667 MG capsule  Commonly known as:  PHOSLO     furosemide 80 MG tablet  Commonly known as:  LASIX     mycophenolate 250 MG capsule  Commonly known as:  CELLCEPT     predniSONE 5 MG tablet  Commonly known as:  DELTASONE     sodium bicarbonate 650 MG tablet     SUDAFED 30 MG tablet  Generic drug:  pseudoephedrine     tacrolimus 1 MG capsule  Commonly known as:  PROGRAF      TAKE these  medications        amLODipine 10 MG tablet  Commonly known as:  NORVASC  Take 10 mg by mouth daily.     calcitRIOL 0.5 MCG capsule  Commonly known as:  ROCALTROL  Take 1 capsule (0.5 mcg total) by mouth Every Tuesday,Thursday,and Saturday with dialysis.     carvedilol 25 MG tablet  Commonly known as:  COREG  Take 25 mg by mouth every 12 (twelve) hours.     cefTAZidime 2 g in dextrose 5 % 50 mL  Inject 2 g into the vein Every Tuesday,Thursday,and Saturday with dialysis. Stop after last dose on HD 09/25/15  Start taking on:  09/25/2015     CORICIDIN HBP CONGESTION/COUGH 10-200 MG Caps  Generic drug:  Dextromethorphan-Guaifenesin  Take 1 tablet by mouth daily as needed (cough, congestion).     ferrous sulfate 325 (65 FE) MG tablet  Take 1 tablet (325 mg total) by mouth 2 (two) times daily with a meal.     hydrALAZINE 50 MG tablet  Commonly known as:  APRESOLINE  Take 1 tablet (50 mg total) by mouth every 8 (eight) hours.     sevelamer carbonate 800 MG tablet  Commonly known as:  RENVELA  Take 1,600 mg by mouth 3 (three) times daily.     simvastatin 5 MG tablet  Commonly known as:  ZOCOR  Take 1 tablet (5 mg total) by mouth at bedtime.     Vancomycin 750 MG/150ML Soln  Commonly known as:  VANCOCIN  Inject 150 mLs (750 mg total) into the vein Every Tuesday,Thursday,and Saturday with dialysis. Stop after last dose on 09/25/15         The results of significant diagnostics from this hospitalization (including imaging, microbiology, ancillary and laboratory) are listed below for reference.    Significant Diagnostic Studies: Dg Chest 2 View  09/21/2015  CLINICAL DATA:  SOB for 3 days. Cough for 6 days. History of hypertension, dialysis. EXAM: CHEST  2 VIEW COMPARISON:  02/14/2015 FINDINGS: Heart size is upper normal. Right-sided dialysis catheter tip to the upper right atrium. Moderate right pleural effusion is increased. Right basilar opacity obscures the hemidiaphragm.  IMPRESSION: Right lower lobe atelectasis or infiltrate and right pleural effusion. Electronically Signed   By: Nolon Nations M.D.   On: 09/21/2015 17:05     Microbiology: Recent Results (from the past 240 hour(s))  Blood culture (routine x 2)     Status: None (Preliminary result)   Collection Time: 09/21/15  6:17 PM  Result Value Ref Range Status   Specimen Description BLOOD LEFT HAND  Final   Special Requests IN PEDIATRIC BOTTLE 3CC  Final   Culture NO GROWTH 2 DAYS  Final   Report Status PENDING  Incomplete  Culture, sputum-assessment     Status: None   Collection Time: 09/21/15  7:36 PM  Result Value Ref Range Status   Specimen Description SPUTUM  Final   Special Requests NONE  Final   Sputum evaluation   Final    MICROSCOPIC FINDINGS SUGGEST THAT THIS SPECIMEN IS NOT REPRESENTATIVE OF LOWER RESPIRATORY SECRETIONS. PLEASE RECOLLECT. NOTIFIED N MURPHY RN  2323 09/21/15 A BROWNING    Report Status 09/21/2015 FINAL  Final  Blood culture (routine x 2)     Status: None (Preliminary result)   Collection Time: 09/21/15  7:46 PM  Result Value Ref Range Status   Specimen Description BLOOD LEFT HAND  Final   Special Requests BOTTLES DRAWN AEROBIC AND ANAEROBIC 5CC  Final   Culture NO GROWTH 2 DAYS  Final   Report Status PENDING  Incomplete  MRSA PCR Screening     Status: Abnormal   Collection Time: 09/22/15  6:07 AM  Result Value Ref Range Status   MRSA by PCR POSITIVE (A) NEGATIVE Final    Comment:        The GeneXpert MRSA Assay (FDA approved for NASAL specimens only), is one component of a comprehensive MRSA colonization surveillance program. It is not intended to diagnose MRSA infection nor to guide or monitor treatment for MRSA infections. RESULT CALLED TO, READ BACK BY AND VERIFIED WITH: Grayland Jack RN 9:35 09/22/15 ([email or web address])   Respiratory virus panel     Status: Abnormal   Collection Time: 09/22/15 12:54 PM  Result Value Ref Range Status   Respiratory Syncytial Virus A  Negative Negative Final   Respiratory Syncytial Virus B Positive (A) Negative Final   Influenza A Negative Negative Final   Influenza B Negative Negative Final   Parainfluenza 1 Negative Negative Final   Parainfluenza 2 Negative Negative Final   Parainfluenza 3 Negative Negative Final   Metapneumovirus Negative Negative Final   Rhinovirus Negative Negative Final   Adenovirus Negative Negative Final    Comment: (NOTE) Performed At: [facility] [place], [place] [id number] Lindon Romp MD IO:[id number]      Labs: Basic Metabolic Panel:  Recent Labs Lab 09/21/15 1556 09/21/15 1946 09/22/15 0350 09/23/15 0745  NA 137  --  136 138  K 4.2  --  4.3 3.9  CL 95*  --  99* 98*  CO2 25  --  26 27  GLUCOSE 83  --  65 98  BUN 12  --  19 24*  CREATININE 6.67* 7.06* 8.04* 10.66*  CALCIUM 9.0  --  8.2* 8.4*  MG  --  1.7  --   --   PHOS  --  2.5  --  3.9   Liver Function Tests:  Recent Labs Lab 09/21/15 1556 09/22/15 0350 09/23/15 0745  AST 33 23  --   ALT 14* 11*  --   ALKPHOS 118 84  --   BILITOT 1.7* 1.1  --   PROT 8.5* 6.3*  --   ALBUMIN 3.5 2.5* 2.6*    Recent Labs Lab 09/21/15 1556  LIPASE 141*   No results for input(s): AMMONIA in the last 168 hours. CBC:  Recent Labs Lab 09/21/15 1556 09/21/15 1946 09/22/15 0350 09/23/15 0745  WBC 9.4 7.6 8.5 4.9  NEUTROABS  --  5.2 4.9  --   HGB 10.6* 7.9* 7.8* 7.7*  HCT 32.8* 25.9* 24.9* 23.9*  MCV 83.7 84.4 83.6 83.0  PLT 97* 71* 71* 102*   Cardiac Enzymes: No results for input(s): CKTOTAL, CKMB, CKMBINDEX, TROPONINI in the last 168 hours. BNP: Invalid input(s): POCBNP CBG: No results for input(s): GLUCAP in the last 168 hours.  Time coordinating discharge:  Greater than 30 minutes  Signed:  Manveer Gomes, DO [facility] Pager: [phone] 09/24/2015, 10:09 AM

## 2015-09-26 LAB — CULTURE, BLOOD (ROUTINE X 2)
CULTURE: NO GROWTH
Culture: NO GROWTH

## 2015-10-17 DIAGNOSIS — T8612 Kidney transplant failure: Secondary | ICD-10-CM | POA: Insufficient documentation

## 2015-10-30 ENCOUNTER — Other Ambulatory Visit
Admission: RE | Admit: 2015-10-30 | Discharge: 2015-10-30 | Disposition: A | Discharge status: Home / Self Care | Payer: Self-pay | Source: Ambulatory Visit | Attending: Nephrology | Admitting: Nephrology

## 2015-10-30 DIAGNOSIS — D649 Anemia, unspecified: Secondary | ICD-10-CM | POA: Insufficient documentation

## 2015-10-30 LAB — HEMOGLOBIN: HEMOGLOBIN: 6.3 g/dL — AB (ref 13.0–18.0)

## 2015-11-02 ENCOUNTER — Encounter (HOSPITAL_COMMUNITY): Payer: Self-pay | Admitting: Emergency Medicine

## 2015-11-02 ENCOUNTER — Inpatient Hospital Stay (HOSPITAL_COMMUNITY)
Admission: EM | Admit: 2015-11-02 | Discharge: 2015-11-04 | DRG: 811 | Disposition: A | Discharge status: Home / Self Care | Payer: Self-pay | Attending: Internal Medicine | Admitting: Internal Medicine

## 2015-11-02 ENCOUNTER — Emergency Department (HOSPITAL_COMMUNITY): Payer: Self-pay

## 2015-11-02 DIAGNOSIS — E785 Hyperlipidemia, unspecified: Secondary | ICD-10-CM | POA: Diagnosis present

## 2015-11-02 DIAGNOSIS — M898X9 Other specified disorders of bone, unspecified site: Secondary | ICD-10-CM | POA: Diagnosis present

## 2015-11-02 DIAGNOSIS — Z940 Kidney transplant status: Secondary | ICD-10-CM

## 2015-11-02 DIAGNOSIS — T8612 Kidney transplant failure: Secondary | ICD-10-CM | POA: Diagnosis present

## 2015-11-02 DIAGNOSIS — D631 Anemia in chronic kidney disease: Secondary | ICD-10-CM | POA: Diagnosis present

## 2015-11-02 DIAGNOSIS — Z992 Dependence on renal dialysis: Secondary | ICD-10-CM

## 2015-11-02 DIAGNOSIS — I10 Essential (primary) hypertension: Secondary | ICD-10-CM | POA: Diagnosis present

## 2015-11-02 DIAGNOSIS — D649 Anemia, unspecified: Secondary | ICD-10-CM

## 2015-11-02 DIAGNOSIS — G4733 Obstructive sleep apnea (adult) (pediatric): Secondary | ICD-10-CM | POA: Diagnosis present

## 2015-11-02 DIAGNOSIS — Z8701 Personal history of pneumonia (recurrent): Secondary | ICD-10-CM

## 2015-11-02 DIAGNOSIS — N186 End stage renal disease: Secondary | ICD-10-CM | POA: Diagnosis present

## 2015-11-02 DIAGNOSIS — Z9119 Patient's noncompliance with other medical treatment and regimen: Secondary | ICD-10-CM

## 2015-11-02 DIAGNOSIS — H35039 Hypertensive retinopathy, unspecified eye: Secondary | ICD-10-CM | POA: Diagnosis present

## 2015-11-02 DIAGNOSIS — D509 Iron deficiency anemia, unspecified: Principal | ICD-10-CM | POA: Diagnosis present

## 2015-11-02 DIAGNOSIS — I120 Hypertensive chronic kidney disease with stage 5 chronic kidney disease or end stage renal disease: Secondary | ICD-10-CM | POA: Diagnosis present

## 2015-11-02 DIAGNOSIS — D696 Thrombocytopenia, unspecified: Secondary | ICD-10-CM

## 2015-11-02 DIAGNOSIS — R0602 Shortness of breath: Secondary | ICD-10-CM | POA: Diagnosis present

## 2015-11-02 DIAGNOSIS — R195 Other fecal abnormalities: Secondary | ICD-10-CM | POA: Diagnosis present

## 2015-11-02 DIAGNOSIS — Y830 Surgical operation with transplant of whole organ as the cause of abnormal reaction of the patient, or of later complication, without mention of misadventure at the time of the procedure: Secondary | ICD-10-CM | POA: Diagnosis present

## 2015-11-02 DIAGNOSIS — J90 Pleural effusion, not elsewhere classified: Secondary | ICD-10-CM

## 2015-11-02 LAB — COMPREHENSIVE METABOLIC PANEL
ALBUMIN: 3.3 g/dL — AB (ref 3.5–5.0)
ALK PHOS: 81 U/L (ref 38–126)
ALT: 9 U/L — ABNORMAL LOW (ref 17–63)
ANION GAP: 11 (ref 5–15)
AST: 23 U/L (ref 15–41)
BILIRUBIN TOTAL: 0.8 mg/dL (ref 0.3–1.2)
BUN: 8 mg/dL (ref 6–20)
CALCIUM: 9.2 mg/dL (ref 8.9–10.3)
CO2: 31 mmol/L (ref 22–32)
Chloride: 98 mmol/L — ABNORMAL LOW (ref 101–111)
Creatinine, Ser: 4.58 mg/dL — ABNORMAL HIGH (ref 0.61–1.24)
GFR calc non Af Amer: 14 mL/min — ABNORMAL LOW (ref >60)
GFR, EST AFRICAN AMERICAN: 16 mL/min — AB (ref >60)
GLUCOSE: 129 mg/dL — AB (ref 65–99)
POTASSIUM: 4.6 mmol/L (ref 3.5–5.1)
SODIUM: 140 mmol/L (ref 135–145)
TOTAL PROTEIN: 8 g/dL (ref 6.5–8.1)

## 2015-11-02 LAB — CBC
HEMATOCRIT: 20.5 % — AB (ref 39.0–52.0)
HEMOGLOBIN: 6.8 g/dL — AB (ref 13.0–17.0)
MCH: 26.7 pg (ref 26.0–34.0)
MCHC: 33.2 g/dL (ref 30.0–36.0)
MCV: 80.4 fL (ref 78.0–100.0)
Platelets: 123 10*3/uL — ABNORMAL LOW (ref 150–400)
RBC: 2.55 MIL/uL — AB (ref 4.22–5.81)
RDW: 19.5 % — ABNORMAL HIGH (ref 11.5–15.5)
WBC: 4.7 10*3/uL (ref 4.0–10.5)

## 2015-11-02 LAB — PREPARE RBC (CROSSMATCH)

## 2015-11-02 MED ORDER — SODIUM CHLORIDE 0.9 % IV SOLN: 10.0000 mL/h | Freq: Once | INTRAVENOUS | Status: DC

## 2015-11-02 NOTE — ED Notes (Signed)
Pt here from dialysis for low hgb and generalized weakness and dyspnea with exertion

## 2015-11-02 NOTE — ED Notes (Signed)
IV team at bedside 

## 2015-11-02 NOTE — ED Provider Notes (Signed)
CSN: [id number]     Arrival date & time 11/02/15  1555 History   First MD Initiated Contact with Patient 11/02/15 2118     Chief Complaint  Patient presents with  . Abnormal Lab     (Consider location/radiation/quality/duration/timing/severity/associated sxs/prior Treatment) HPI   Patient is a 50 year old male with HTN, HLD, ESRD on dialysis Tu/Th/Sa, anemia of chronic disease, he states that he is sent here from dialysis for treatment and evaluation of low hemoglobin which they confirmed has been low this past week.  Patient states he has had generalized weakness, fatigue and dyspnea on exertion for the past 4 days. He is just laid around the house.  He reports intermittent coughing spells where he has clear to white productive sputum. He denies hemoptysis. He denies headache, chest pain, fever, night sweats. He reports feeling cold but denies chills.  Patient states that at dialysis today he did feel weak and lightheaded.  He has had a blood transfusion in the past.  Patient has had minimal urine output he states he urinates about 2 times a day. He is on 5 mg of prednisone daily, has been 15 years since his kidney transplant. He was admitted 09/21/2015, nearly 6 weeks ago, for sepsis and HCAP, and that time had a moderate right pleural effusion.  He reports that he improved after being discharged from the hospital.  He reports a attempted thoracentesis years ago at [facility]. Patient has no other acute complaints.   Past Medical History  Diagnosis Date  . Hypertension   . Hyperlipidemia   . Chronic kidney disease   . OSA (obstructive sleep apnea) 03/27/2013  . Blood transfusion without reported diagnosis    Past Surgical History  Procedure Laterality Date  . Kidney transplant     Family History  Problem Relation Age of Onset  . Heart disease Mother   . Cancer Maternal Grandmother   . Cancer Maternal Grandfather   . Asthma Son   . Asthma Son    Social History  Substance Use  Topics  . Smoking status: Never Smoker   . Smokeless tobacco: Never Used  . Alcohol Use: No    Review of Systems  Constitutional: Positive for activity change and fatigue. Negative for fever, chills, diaphoresis and appetite change.  HENT: Negative.   Respiratory: Positive for cough and shortness of breath. Negative for wheezing.   Cardiovascular: Negative.   Gastrointestinal: Negative.   Genitourinary: Negative.   Musculoskeletal: Negative.   Skin: Negative.  Negative for color change and pallor.  Neurological: Positive for weakness. Negative for syncope.  Psychiatric/Behavioral: Negative.       Allergies  Review of patient's allergies indicates no known allergies.  Home Medications   Prior to Admission medications   Medication Sig Start Date End Date Taking? Authorizing Provider  amLODipine (NORVASC) 10 MG tablet Take 10 mg by mouth daily.   Yes Historical Provider, MD  calcitRIOL (ROCALTROL) 0.5 MCG capsule Take 1 capsule (0.5 mcg total) by mouth Every Tuesday,Thursday,and Saturday with dialysis. 09/24/15  Yes Orson Eva, MD  carvedilol (COREG) 25 MG tablet Take 25 mg by mouth every 12 (twelve) hours. 08/23/15  Yes Historical Provider, MD  cefTAZidime 2 g in dextrose 5 % 50 mL Inject 2 g into the vein Every Tuesday,Thursday,and Saturday with dialysis. Stop after last dose on HD 09/25/15 09/25/15  Yes Orson Eva, MD  cinacalcet (SENSIPAR) 30 MG tablet Take 30 mg by mouth daily. 09/14/15 12/13/15 Yes Historical Provider, MD  hydrALAZINE (APRESOLINE)  50 MG tablet Take 1 tablet (50 mg total) by mouth every 8 (eight) hours. 09/24/15  Yes Orson Eva, MD  sevelamer carbonate (RENVELA) 800 MG tablet Take 1,600 mg by mouth 3 (three) times daily. 08/23/15 08/22/16 Yes Historical Provider, MD  simvastatin (ZOCOR) 5 MG tablet Take 1 tablet (5 mg total) by mouth at bedtime. 02/17/15  Yes Shanker Kristeen Mans, MD   BP 185/106 mmHg  Pulse 105  Temp(Src) 98.5 F (36.9 C) (Oral)  Resp 16  Ht 5' 10"   (1.778 m)  Wt 76.5 kg  BMI 24.20 kg/m2  SpO2 100% Physical Exam  Constitutional: He is oriented to person, place, and time. He appears well-developed and well-nourished. No distress.  HENT:  Head: Normocephalic and atraumatic.  Nose: Nose normal.  Mouth/Throat: Uvula is midline and oropharynx is clear and moist. Mucous membranes are pale, not dry and not cyanotic. No oropharyngeal exudate.  Eyes: Conjunctivae and EOM are normal. Pupils are equal, round, and reactive to light. Right eye exhibits no discharge. Left eye exhibits no discharge. No scleral icterus.  [place] conjunctival pallor  Neck: Normal range of motion. No JVD present. No tracheal deviation present. No thyromegaly present.  Cardiovascular: Regular rhythm, normal heart sounds and intact distal pulses.  Tachycardia present.  Exam reveals no gallop and no friction rub.   No murmur heard. Pulses:      Radial pulses are 2+ on the right side, and 2+ on the left side.  Pulmonary/Chest: Effort normal. No accessory muscle usage. No respiratory distress. He has decreased breath sounds in the right upper field and the right middle field. He has no wheezes. He has no rhonchi. He exhibits no tenderness.  Dullness to percussion 2/3 up right lung fields  Abdominal: Soft. Normal appearance and bowel sounds are normal. He exhibits no distension and no mass. There is no tenderness. There is no rebound and no guarding.  Musculoskeletal: Normal range of motion. He exhibits no edema or tenderness.  Lymphadenopathy:    He has no cervical adenopathy.  Neurological: He is alert and oriented to person, place, and time. He has normal reflexes. No cranial nerve deficit. He exhibits normal muscle tone. Coordination normal.  Skin: Skin is warm and dry. No rash noted. He is not diaphoretic. No cyanosis or erythema. No pallor. Nails show no clubbing.  Psychiatric: He has a normal mood and affect. His behavior is normal. Judgment and thought content normal.   Nursing note and vitals reviewed.   ED Course  Procedures (including critical care time) Labs Review Labs Reviewed  COMPREHENSIVE METABOLIC PANEL - Abnormal; Notable for the following:    Chloride 98 (*)    Glucose, Bld 129 (*)    Creatinine, Ser 4.58 (*)    Albumin 3.3 (*)    ALT 9 (*)    GFR calc non Af Amer 14 (*)    GFR calc Af Amer 16 (*)    All other components within normal limits  CBC - Abnormal; Notable for the following:    RBC 2.55 (*)    Hemoglobin 6.8 (*)    HCT 20.5 (*)    RDW 19.5 (*)    Platelets 123 (*)    All other components within normal limits  IRON AND TIBC - Abnormal; Notable for the following:    TIBC 176 (*)    All other components within normal limits  FERRITIN - Abnormal; Notable for the following:    Ferritin 491 (*)    All other components  within normal limits  RETICULOCYTES - Abnormal; Notable for the following:    RBC. 2.04 (*)    All other components within normal limits  PROTIME-INR - Abnormal; Notable for the following:    Prothrombin Time 15.3 (*)    All other components within normal limits  APTT - Abnormal; Notable for the following:    aPTT 38 (*)    All other components within normal limits  POC OCCULT BLOOD, ED - Abnormal; Notable for the following:    Fecal Occult Bld POSITIVE (*)    All other components within normal limits  MRSA PCR SCREENING  VITAMIN B12  FOLATE  CBC  BASIC METABOLIC PANEL  TSH  T4, FREE  POC OCCULT BLOOD, ED  TYPE AND SCREEN  PREPARE RBC (CROSSMATCH)    Imaging Review Dg Chest 2 View  11/02/2015  CLINICAL DATA:  Productive cough for 1 week.  Mid chest pain. EXAM: CHEST  2 VIEW COMPARISON:  09/21/2015 FINDINGS: There is a large right pleural effusion. Right basilar opacity likely reflects atelectasis. Left lung is clear. Mild cardiomegaly. Right dialysis catheter remains in place, unchanged. No acute bony abnormality. IMPRESSION: Large right pleural effusion, increasing since prior study. Right base  atelectasis. Electronically Signed   By: Rolm Baptise M.D.   On: 11/02/2015 18:01   I have personally reviewed and evaluated these images and lab results as part of my medical decision-making.   EKG Interpretation None      MDM   Patient sent to the ER for admission for a critical hemoglobin, symptomatic anemia, and for blood transfusion  Patient states he's had 4 days of fatigue, weakness, shortness of breath with minimal exertion Patient states he tolerated dialysis well today, just felt weak, in the ER he is tachycardic and hypertensive, no hypoxia, no respiratory distress Patient was found with chest x-ray to have a large right-sided pleural effusion, worsening from 6 weeks ago. He denies any fevers, sweats, URI symptoms, however he states he has intermittent coughing fits with clear to white productive sputum and he does feel short of breath.  Patient had type and screen and basic labs obtained at the time he presented to the ER. They were resulted prior to the time of my evaluation.  Patient's hemoglobin is 6.8, increased from three days prior 6.3, recent baseline appears to be 7.7-7.9 in December.  Patient was also thrombocytopenic, with positive fecal occult.   Dr. Posey Pronto from nephrology was consulted, who stated with successful dialysis today and since anemia appears to be stable, one unit of blood to be initiated in the ER, and he will consult in the am.    1 Unit PRBC transfusion initiated in ER.    [facility] to admit for symptomatic anemia and pleural effusion.  The patient will likely benefit greatly from thoracentesis.  Treatment of possible HCAP deferred to inpt team.  The patient has cough but he does not have any constitutional symptoms of infection, currently no leukocytosis, patient afebrile.  He is at risk for recurrent HCAP, immunosuppressed with chronic prednisone use.  Currently no respiratory distress, no tachypnea at rest, able to speak in complete  sentences.  Patient was admitted to telemetry by Dr. Eulas Post for symptomatic anemia, + hemoccult, right pleural effusion, SOB, for further workup and treatment.  Final diagnoses:  None   CRITICAL CARE Performed by: Delsa Grana Total critical care time: 45 Critical care time was exclusive of separately billable procedures and treating other patients. Critical care was necessary  to treat or prevent imminent or life-threatening deterioration. Critical care was time spent personally by me on the following activities: development of treatment plan with patient and/or surrogate as well as nursing, discussions with consultants, evaluation of patient's response to treatment, examination of patient, obtaining history from patient or surrogate, ordering and performing treatments and interventions, ordering and review of laboratory studies, ordering and review of radiographic studies, pulse oximetry and re-evaluation of patient's condition.      Delsa Grana, PA-C 11/03/15 [id number]  Sherwood Gambler, MD 11/06/15 [phone]

## 2015-11-02 NOTE — ED Notes (Signed)
No answer for reassessment of vital signs @ 1933

## 2015-11-03 ENCOUNTER — Encounter (HOSPITAL_COMMUNITY): Payer: Self-pay | Admitting: *Deleted

## 2015-11-03 ENCOUNTER — Inpatient Hospital Stay (HOSPITAL_COMMUNITY): Payer: Self-pay

## 2015-11-03 ENCOUNTER — Inpatient Hospital Stay (HOSPITAL_COMMUNITY): Payer: No Typology Code available for payment source

## 2015-11-03 DIAGNOSIS — D696 Thrombocytopenia, unspecified: Secondary | ICD-10-CM

## 2015-11-03 DIAGNOSIS — J90 Pleural effusion, not elsewhere classified: Secondary | ICD-10-CM

## 2015-11-03 DIAGNOSIS — D649 Anemia, unspecified: Secondary | ICD-10-CM

## 2015-11-03 DIAGNOSIS — R0602 Shortness of breath: Secondary | ICD-10-CM

## 2015-11-03 LAB — IRON AND TIBC
Iron: 69 ug/dL (ref 45–182)
SATURATION RATIOS: 39 % (ref 17.9–39.5)
TIBC: 176 ug/dL — ABNORMAL LOW (ref 250–450)
UIBC: 107 ug/dL

## 2015-11-03 LAB — TSH: TSH: 3.076 u[IU]/mL (ref 0.350–4.500)

## 2015-11-03 LAB — BASIC METABOLIC PANEL
ANION GAP: 6 (ref 5–15)
BUN: 15 mg/dL (ref 6–20)
CALCIUM: 8.5 mg/dL — AB (ref 8.9–10.3)
CO2: 29 mmol/L (ref 22–32)
CREATININE: 7.14 mg/dL — AB (ref 0.61–1.24)
Chloride: 104 mmol/L (ref 101–111)
GFR, EST AFRICAN AMERICAN: 9 mL/min — AB (ref >60)
GFR, EST NON AFRICAN AMERICAN: 8 mL/min — AB (ref >60)
GLUCOSE: 89 mg/dL (ref 65–99)
Potassium: 4.5 mmol/L (ref 3.5–5.1)
Sodium: 139 mmol/L (ref 135–145)

## 2015-11-03 LAB — PROTIME-INR
INR: 1.19 (ref 0.00–1.49)
Prothrombin Time: 15.3 seconds — ABNORMAL HIGH (ref 11.6–15.2)

## 2015-11-03 LAB — CBC
HCT: 19.1 % — ABNORMAL LOW (ref 39.0–52.0)
HEMOGLOBIN: 6.3 g/dL — AB (ref 13.0–17.0)
MCH: 26.7 pg (ref 26.0–34.0)
MCHC: 32.5 g/dL (ref 30.0–36.0)
MCV: 82.3 fL (ref 78.0–100.0)
PLATELETS: 100 10*3/uL — AB (ref 150–400)
RBC: 2.32 MIL/uL — ABNORMAL LOW (ref 4.22–5.81)
RDW: 18.8 % — AB (ref 11.5–15.5)
WBC: 4.7 10*3/uL (ref 4.0–10.5)

## 2015-11-03 LAB — RETICULOCYTES
RBC.: 2.04 MIL/uL — AB (ref 4.22–5.81)
RETIC COUNT ABSOLUTE: 28.6 10*3/uL (ref 19.0–186.0)
RETIC CT PCT: 1.4 % (ref 0.4–3.1)

## 2015-11-03 LAB — GRAM STAIN

## 2015-11-03 LAB — HEMOGLOBIN AND HEMATOCRIT, BLOOD
HCT: 20 % — ABNORMAL LOW (ref 39.0–52.0)
HEMATOCRIT: 24.3 % — AB (ref 39.0–52.0)
HEMOGLOBIN: 6.4 g/dL — AB (ref 13.0–17.0)
Hemoglobin: 7.7 g/dL — ABNORMAL LOW (ref 13.0–17.0)

## 2015-11-03 LAB — BODY FLUID CELL COUNT WITH DIFFERENTIAL
Eos, Fluid: 3 %
Lymphs, Fluid: 8 %
Monocyte-Macrophage-Serous Fluid: 87 % (ref 50–90)
Neutrophil Count, Fluid: 2 % (ref 0–25)
WBC FLUID: 254 uL (ref 0–1000)

## 2015-11-03 LAB — T4, FREE: FREE T4: 1.14 ng/dL — AB (ref 0.61–1.12)

## 2015-11-03 LAB — POC OCCULT BLOOD, ED: Fecal Occult Bld: POSITIVE — AB

## 2015-11-03 LAB — PROTEIN, BODY FLUID: TOTAL PROTEIN, FLUID: 3.8 g/dL

## 2015-11-03 LAB — MRSA PCR SCREENING: MRSA by PCR: NEGATIVE

## 2015-11-03 LAB — LACTATE DEHYDROGENASE, PLEURAL OR PERITONEAL FLUID: LD, Fluid: 169 U/L — ABNORMAL HIGH (ref 3–23)

## 2015-11-03 LAB — FOLATE: Folate: 7.7 ng/mL (ref >5.9)

## 2015-11-03 LAB — FERRITIN: Ferritin: 491 ng/mL — ABNORMAL HIGH (ref 24–336)

## 2015-11-03 LAB — PREPARE RBC (CROSSMATCH)

## 2015-11-03 LAB — VITAMIN B12: VITAMIN B 12: 358 pg/mL (ref 180–914)

## 2015-11-03 LAB — APTT: aPTT: 38 seconds — ABNORMAL HIGH (ref 24–37)

## 2015-11-03 MED ORDER — SODIUM CHLORIDE 0.9 % IV SOLN
125.0000 mg | INTRAVENOUS | Status: DC
  Administered 2015-11-04: 125 mg via INTRAVENOUS
  Filled 2015-11-03 (×2): qty 10

## 2015-11-03 MED ORDER — HYDRALAZINE HCL 50 MG PO TABS
50.0000 mg | ORAL_TABLET | Freq: Three times a day (TID) | ORAL | Status: DC
  Administered 2015-11-03 – 2015-11-04 (×5): 50 mg via ORAL
  Filled 2015-11-03 (×5): qty 1

## 2015-11-03 MED ORDER — AMLODIPINE BESYLATE 10 MG PO TABS
10.0000 mg | ORAL_TABLET | Freq: Every day | ORAL | Status: DC
  Filled 2015-11-03: qty 1

## 2015-11-03 MED ORDER — HYDRALAZINE HCL 20 MG/ML IJ SOLN
10.0000 mg | Freq: Once | INTRAMUSCULAR | Status: AC
  Administered 2015-11-03: 10 mg via INTRAVENOUS
  Filled 2015-11-03: qty 1

## 2015-11-03 MED ORDER — OXYCODONE-ACETAMINOPHEN 5-325 MG PO TABS
1.0000 | ORAL_TABLET | Freq: Four times a day (QID) | ORAL | Status: DC | PRN
  Administered 2015-11-03 – 2015-11-04 (×3): 1 via ORAL
  Filled 2015-11-03 (×3): qty 1

## 2015-11-03 MED ORDER — CETYLPYRIDINIUM CHLORIDE 0.05 % MT LIQD
7.0000 mL | Freq: Two times a day (BID) | OROMUCOSAL | Status: DC
  Administered 2015-11-03 (×2): 7 mL via OROMUCOSAL

## 2015-11-03 MED ORDER — HYDRALAZINE HCL 20 MG/ML IJ SOLN
10.0000 mg | Freq: Four times a day (QID) | INTRAMUSCULAR | Status: DC | PRN
  Filled 2015-11-03 (×2): qty 1

## 2015-11-03 MED ORDER — ACETAMINOPHEN 650 MG RE SUPP: 650.0000 mg | Freq: Four times a day (QID) | RECTAL | Status: DC | PRN

## 2015-11-03 MED ORDER — ONDANSETRON HCL 4 MG PO TABS: 4.0000 mg | ORAL_TABLET | Freq: Four times a day (QID) | ORAL | Status: DC | PRN

## 2015-11-03 MED ORDER — ONDANSETRON HCL 4 MG/2ML IJ SOLN
4.0000 mg | Freq: Four times a day (QID) | INTRAMUSCULAR | Status: DC | PRN
  Administered 2015-11-04: 4 mg via INTRAVENOUS

## 2015-11-03 MED ORDER — CARVEDILOL 25 MG PO TABS
25.0000 mg | ORAL_TABLET | Freq: Two times a day (BID) | ORAL | Status: DC
  Administered 2015-11-03 – 2015-11-04 (×3): 25 mg via ORAL
  Filled 2015-11-03 (×3): qty 1

## 2015-11-03 MED ORDER — PANTOPRAZOLE SODIUM 40 MG PO TBEC
40.0000 mg | DELAYED_RELEASE_TABLET | Freq: Every day | ORAL | Status: DC
  Administered 2015-11-03 – 2015-11-04 (×2): 40 mg via ORAL
  Filled 2015-11-03 (×2): qty 1

## 2015-11-03 MED ORDER — AMLODIPINE BESYLATE 10 MG PO TABS
10.0000 mg | ORAL_TABLET | Freq: Every day | ORAL | Status: DC
  Administered 2015-11-03: 10 mg via ORAL
  Filled 2015-11-03 (×2): qty 1

## 2015-11-03 MED ORDER — ACETAMINOPHEN 325 MG PO TABS
650.0000 mg | ORAL_TABLET | Freq: Four times a day (QID) | ORAL | Status: DC | PRN
  Filled 2015-11-03: qty 2

## 2015-11-03 MED ORDER — SODIUM CHLORIDE 0.9 % IV SOLN: Freq: Once | INTRAVENOUS | Status: DC

## 2015-11-03 MED ORDER — CALCITRIOL 0.5 MCG PO CAPS
0.5000 ug | ORAL_CAPSULE | ORAL | Status: DC
  Administered 2015-11-04: 0.5 ug via ORAL

## 2015-11-03 MED ORDER — SIMVASTATIN 5 MG PO TABS
5.0000 mg | ORAL_TABLET | Freq: Every day | ORAL | Status: DC
  Administered 2015-11-03: 5 mg via ORAL
  Filled 2015-11-03 (×3): qty 1

## 2015-11-03 MED ORDER — RENA-VITE PO TABS
1.0000 | ORAL_TABLET | Freq: Every day | ORAL | Status: DC
Start: 2015-11-03 — End: 2015-11-04
  Administered 2015-11-03: 1 via ORAL
  Filled 2015-11-03: qty 1

## 2015-11-03 MED ORDER — SODIUM CHLORIDE 0.9 % IJ SOLN
3.0000 mL | Freq: Two times a day (BID) | INTRAMUSCULAR | Status: DC
  Administered 2015-11-03 – 2015-11-04 (×3): 3 mL via INTRAVENOUS

## 2015-11-03 MED ORDER — CINACALCET HCL 30 MG PO TABS
30.0000 mg | ORAL_TABLET | Freq: Every day | ORAL | Status: DC
  Administered 2015-11-03 – 2015-11-04 (×2): 30 mg via ORAL
  Filled 2015-11-03 (×2): qty 1

## 2015-11-03 MED ORDER — DARBEPOETIN ALFA 200 MCG/0.4ML IJ SOSY
200.0000 ug | PREFILLED_SYRINGE | INTRAMUSCULAR | Status: DC
  Administered 2015-11-04: 200 ug via INTRAVENOUS
  Filled 2015-11-03: qty 0.4

## 2015-11-03 MED ORDER — SEVELAMER CARBONATE 800 MG PO TABS
1600.0000 mg | ORAL_TABLET | Freq: Three times a day (TID) | ORAL | Status: DC
  Administered 2015-11-03 – 2015-11-04 (×3): 1600 mg via ORAL
  Filled 2015-11-03 (×2): qty 2

## 2015-11-03 MED ORDER — FUROSEMIDE 10 MG/ML IJ SOLN
20.0000 mg | Freq: Once | INTRAMUSCULAR | Status: AC
  Administered 2015-11-03: 20 mg via INTRAVENOUS
  Filled 2015-11-03: qty 2

## 2015-11-03 MED ORDER — AMLODIPINE BESYLATE 10 MG PO TABS: 10.0000 mg | ORAL_TABLET | Freq: Every day | ORAL | Status: DC

## 2015-11-03 NOTE — Progress Notes (Signed)
Patient refusing SCDs, reviewed the importance of the SCDs but pt says he is active, up and walking in the room. MD made aware.

## 2015-11-03 NOTE — Progress Notes (Signed)
[facility] PROGRESS NOTE  Jonathon [facility] QJJ:[id number] DOB: [birth year] DOA: 11/02/2015 PCP: Pcp Not In System  Assessment/Plan: 50 y.o. gentleman with a history of ESRD on HD T/T/S, history of renal transplant (failed after 15 years), HTN, HLD, asthma, and OSA (currently not compliant with CPAP) who was referred to the ED from his HD unit in [facility] for evaluation of anemia. The patient has a known history of anemia (presumably related to chronic renal disease and/or iron deficiency; he reports that he has been on iron supplementation in the past). He has had one episode of BRBPR last week, but he denies any known history of internal or external hemorrhoids. No hematemesis  1. Acute on chronic anemia, symptomatic, with history of BRBPR prior to admission.  -Pt transfused 1 unit PRBC, pend repeat Hg. No s/s of acute bleeding at  This time. We will check stool occult blood test.  -cont monitor, will consider GI consult eval for endoscopy  if bleeding, persistent anemia. Cont PPI 2. Right pleural effusion, with a question of whether this finding is contributing to chronic cough and/or anemia and shortness of breath -obtain US thoracentesis  3. ESRD on HD. Dr. Posey Pronto aware of this patient's admission and should see in the AM -cont HD per nephrology. epo injection with next HD 4. Accelerated HTN. Pt did not get his meds today. Spoke with nurse will start meds. Prn hydralazine     Code Status: full Family Communication: d/w patient, RN (indicate person spoken with, relationship, and if by phone, the number) Disposition Plan: home 1-2 days    Consultants:  Nephrology     Procedures:  HD  Antibiotics:  none (indicate start date, and stop date if known)  HPI/Subjective: Alert. No distress   Objective: Filed Vitals:   11/03/15 0459 11/03/15 0608  BP: 177/111 171/89  Pulse: 106 112  Temp: 98.7 F (37.1 C) 97.9 F (36.6 C)  Resp: 18 16    Intake/Output Summary (Last 24  hours) at 11/03/15 1034 Last data filed at 11/03/15 0600  Gross per 24 hour  Intake    335 ml  Output      0 ml  Net    335 ml   Filed Weights   11/02/15 2113  Weight: 76.5 kg (168 lb 10.4 oz)    Exam:   General:  Alert   Cardiovascular: s1,s2 rrr  Respiratory: Diminished RLL  Abdomen: soft, nt,nd   Musculoskeletal: no leg edema    Data Reviewed: Basic Metabolic Panel:  Recent Labs Lab 11/02/15 1629  NA 140  K 4.6  CL 98*  CO2 31  GLUCOSE 129*  BUN 8  CREATININE 4.58*  CALCIUM 9.2   Liver Function Tests:  Recent Labs Lab 11/02/15 1629  AST 23  ALT 9*  ALKPHOS 81  BILITOT 0.8  PROT 8.0  ALBUMIN 3.3*   No results for input(s): LIPASE, AMYLASE in the last 168 hours. No results for input(s): AMMONIA in the last 168 hours. CBC:  Recent Labs Lab 10/30/15 1457 11/02/15 1629  WBC  --  4.7  HGB 6.3* 6.8*  HCT  --  20.5*  MCV  --  80.4  PLT  --  123*   Cardiac Enzymes: No results for input(s): CKTOTAL, CKMB, CKMBINDEX, TROPONINI in the last 168 hours. BNP (last 3 results)  Recent Labs  02/14/15 1755 09/21/15 1946  BNP 1305.9* 2596.3*    ProBNP (last 3 results)  Recent Labs  02/14/15 1612  PROBNP 22230.00*  CBG: No results for input(s): GLUCAP in the last 168 hours.  Recent Results (from the past 240 hour(s))  MRSA PCR Screening     Status: None   Collection Time: 11/03/15  2:16 AM  Result Value Ref Range Status   MRSA by PCR NEGATIVE NEGATIVE Final    Comment:        The GeneXpert MRSA Assay (FDA approved for NASAL specimens only), is one component of a comprehensive MRSA colonization surveillance program. It is not intended to diagnose MRSA infection nor to guide or monitor treatment for MRSA infections.      Studies: Dg Chest 2 View  11/02/2015  CLINICAL DATA:  Productive cough for 1 week.  Mid chest pain. EXAM: CHEST  2 VIEW COMPARISON:  09/21/2015 FINDINGS: There is a large right pleural effusion. Right basilar  opacity likely reflects atelectasis. Left lung is clear. Mild cardiomegaly. Right dialysis catheter remains in place, unchanged. No acute bony abnormality. IMPRESSION: Large right pleural effusion, increasing since prior study. Right base atelectasis. Electronically Signed   By: Rolm Baptise M.D.   On: 11/02/2015 18:01    Scheduled Meds: . sodium chloride  10 mL/hr Intravenous Once  . amLODipine  10 mg Oral Daily  . antiseptic oral rinse  7 mL Mouth Rinse BID  . [START ON 11/04/2015] calcitRIOL  0.5 mcg Oral Q T,Th,Sa-HD  . carvedilol  25 mg Oral BID WC  . cinacalcet  30 mg Oral Q breakfast  . hydrALAZINE  50 mg Oral 3 times per day  . pantoprazole  40 mg Oral Daily  . sevelamer carbonate  1,600 mg Oral TID WC  . simvastatin  5 mg Oral QHS  . sodium chloride  3 mL Intravenous Q12H   Continuous Infusions:   Principal Problem:   Symptomatic anemia Active Problems:   Renal transplant recipient   ESRD on dialysis [facility])   Accelerated hypertension   Shortness of breath   Chronic anemia   Thrombocytopenia (HCC)   Pleural effusion on right    Time spent: >35 minutes     Kinnie Feil  [facility] Pager [phone]. If 7PM-7AM, please contact night-coverage at [email or web address], password [facility] 11/03/2015, 10:34 AM  LOS: 1 day

## 2015-11-03 NOTE — Procedures (Signed)
   Rt US guided thoracentesis 1.4 L yellow fluid Sent for labs per MD  Pt tolerated well  cxr pending

## 2015-11-03 NOTE — Progress Notes (Signed)
Utilization Review Completed.Donne Anon T1/18/2017

## 2015-11-03 NOTE — Progress Notes (Signed)
Patient's BP 162/97, informed MD, hydralazine IV order given, but because pt was getting blood couldn't give IV hydralazine, requested po medicine.  Given verbal order to give Norvasc which is supposed to be given at bedtime.  Administered Norvasc and informed the oncoming nurse that he doesn't need to get another bed time dose.

## 2015-11-03 NOTE — H&P (Signed)
. [facility] History and Physical  Jonathon [facility] IWL:[id number] DOB: 01-20-1966 DOA: 11/02/2015  Specialists: Nephrology  Chief Complaint: Fatigue, progressive DOE  HPI: Jonathon Conley is a 50 y.o. gentleman with a history of ESRD on HD T/T/S, history of renal transplant (failed after 15 years), HTN, HLD, asthma, and OSA (currently not compliant with CPAP) who was referred to the ED from his HD unit in [facility] for evaluation of anemia.  The patient has a known history of anemia (presumably related to chronic renal disease and/or iron deficiency; he reports that he has been on iron supplementation in the past).  Prior to his admission to this [facility] in December 2016 for sepsis secondary to HCAP, his hemoglobin was round 10.  Since December, it has been approximately 7.5.  Tonight he presents with a hemoglobin of 6.8.  He reports an isolated episode of BRBPR (it was blood mixed with stool; no clots) last week.  He has had intermittent episodes of BRBPR in the past, but he denies any known history of internal or external hemorrhoids.  No hematemesis.  He has had progressive shortness of breath, paticularly with exertion, and fatigue.  No syncope.  No chest pain.  No recent travel.  No leg swelling.  No fevers or sweats, but he says he feels cold all the time.  He has had a persistent cough since December, intermittently productive of white/clear sputum.  ED evaluation concerning for mild tachycardia, but his blood pressures are actually elevated.  Chest xray concerning for a worsening right sided pleural effusion (identified previously). Hemoglobin 6.8 as noted above.  Transfusion of one unit of PRBCs is pending.  Nephrology is aware of this admission and will see the patient in the morning.  Hospitalist asked to admit for further evaluation and treatment of anemia and effusion.  Review of Systems: 12 systems reviewed and negative except as stated in HPI.  Past Medical History  Diagnosis Date  .  Hypertension   . Hyperlipidemia   . Chronic kidney disease   . OSA (obstructive sleep apnea) 03/27/2013  . Blood transfusion without reported diagnosis   Asthma ESRD on HD  Past Surgical History  Procedure Laterality Date  . Kidney transplant     Social History:  Social History   Social History Narrative  No tobacco, EtOH, or illicit drug use.  No Known Allergies  Family History  Problem Relation Age of Onset  . Heart disease Mother   . Cancer Maternal Grandmother   . Cancer Maternal Grandfather   . Asthma Son   . Asthma Son   No known family history of colon cancer.  Prior to Admission medications   Medication Sig Start Date End Date Taking? Authorizing Provider  amLODipine (NORVASC) 10 MG tablet Take 10 mg by mouth daily.   Yes Historical Provider, MD  calcitRIOL (ROCALTROL) 0.5 MCG capsule Take 1 capsule (0.5 mcg total) by mouth Every Tuesday,Thursday,and Saturday with dialysis. 09/24/15  Yes Jonathon Eva, MD  carvedilol (COREG) 25 MG tablet Take 25 mg by mouth every 12 (twelve) hours. 08/23/15  Yes Historical Provider, MD  cefTAZidime 2 g in dextrose 5 % 50 mL Inject 2 g into the vein Every Tuesday,Thursday,and Saturday with dialysis. Stop after last dose on HD 09/25/15 09/25/15  Yes Jonathon Eva, MD  cinacalcet (SENSIPAR) 30 MG tablet Take 30 mg by mouth daily. 09/14/15 12/13/15 Yes Historical Provider, MD  hydrALAZINE (APRESOLINE) 50 MG tablet Take 1 tablet (50 mg total) by mouth every 8 (eight) hours.  09/24/15  Yes Jonathon Eva, MD  sevelamer carbonate (RENVELA) 800 MG tablet Take 1,600 mg by mouth 3 (three) times daily. 08/23/15 08/22/16 Yes Historical Provider, MD  simvastatin (ZOCOR) 5 MG tablet Take 1 tablet (5 mg total) by mouth at bedtime. 02/17/15  Yes Jonathon Osgood, MD   Physical Exam: Filed Vitals:   11/02/15 2330 11/03/15 0000 11/03/15 0030 11/03/15 0100  BP: 172/102     Pulse: 103 102 103 109  Temp:      TempSrc:      Resp: 14 21 19 15   Height:      Weight:       SpO2: 97% 98% 97% 97%     General:  Awake and alert.  Oriented to person, place, time and situation.  NAD.  Eyes: PERRL bilaterally  ENT: Moist mucous membranes  Neck: Supple.    Cardiovascular: Tachycardic but regular.  No edema.  Respiratory: Diminished on the right.  No significant wheeze or ronchi.  Abdomen: Soft/NT/ND.  Bowel sounds are present.  No guarding.  Skin: Warm and dry.  Musculoskeletal: Moves all four extremities spontaneously.  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  HD catheter in right chest; dressing C/D/I.  Psychiatric: Normal affect.  Neurologic: No focal deficits.   Labs on Admission:  Basic Metabolic Panel:  Recent Labs Lab 11/02/15 1629  NA 140  K 4.6  CL 98*  CO2 31  GLUCOSE 129*  BUN 8  CREATININE 4.58*  CALCIUM 9.2   Liver Function Tests:  Recent Labs Lab 11/02/15 1629  AST 23  ALT 9*  ALKPHOS 81  BILITOT 0.8  PROT 8.0  ALBUMIN 3.3*   CBC:  Recent Labs Lab 10/30/15 1457 11/02/15 1629  WBC  --  4.7  HGB 6.3* 6.8*  HCT  --  20.5*  MCV  --  80.4  PLT  --  123*    Radiological Exams on Admission: Dg Chest 2 View  11/02/2015  CLINICAL DATA:  Productive cough for 1 week.  Mid chest pain. EXAM: CHEST  2 VIEW COMPARISON:  09/21/2015 FINDINGS: There is a large right pleural effusion. Right basilar opacity likely reflects atelectasis. Left lung is clear. Mild cardiomegaly. Right dialysis catheter remains in place, unchanged. No acute bony abnormality. IMPRESSION: Large right pleural effusion, increasing since prior study. Right base atelectasis. Electronically Signed   By: Jonathon [facility] M.D.   On: 11/02/2015 18:01    Assessment/Plan Principal Problem:   Symptomatic anemia Active Problems:   Renal transplant recipient   ESRD on dialysis [facility])   Accelerated hypertension   Shortness of breath   Chronic anemia   Thrombocytopenia (HCC)   Pleural effusion on right   Acute on chronic anemia, symptomatic, with  history of BRBPR --Admit to telemetry --Agree with transfusion of at least one unit of PRBCs tonight for hemoglobin less than 7.   --Anemia panel prior to transfusion, check TFTs in the AM --Stool occult blood test --Consider GI consult in the AM --Oral protonix for now; low clinical index of suspicion for upper GI bleed at this point  Right pleural effusion, with a question of whether this finding is contributing to chronic cough and/or anemia and shortness of breath --Coags added to admission labs --NPO now --Consider referral to IR in the AM for diagnostic/therapeutic thoracentesis (this fluid collection has been refractory to routine HD)  ESRD on HD --Dr. Posey Pronto aware of this patient's admission and should see in the AM --Consider epo injection with next  HD --Consider additional blood transfusion if indicated with next HD  Accelerated HTN --Continue home medications for now  Code Status: FULL Disposition Plan: Expect the patient to be here at least two midnights  Time spent: 70 minutes  [employer] [facility]  11/03/2015, 1:45 AM

## 2015-11-03 NOTE — Consult Note (Signed)
Reason for Consult:Vol overload, ESRD, Anemia Referring Physician: Dr . Cheron Schaumann is an 50 y.o. male.  HPI: 50 yr male with ESRD from malig HTN. Followed in past by [facility], Dr. Florene Glen.  Received Tx 2001, very nonadherent with f/u, and meds and lost kidney 2016.  Has chosen to be on HD in [place] with Dr. Smith Mince, [facility], still lives in [place].  HD at [facility] [place] TTS.  Has HTN, anemia. Hosp here 12/16 with pneu.  The past weekend has felt weak and tired.  Sent to ED with severe anemia.  Also found to have large R pleural effusion.  No fevers, chills, N, V, D and has good appetite.  Hb 6.6-6.1 sinc 12/29  Constitutional: as above Eyes: wears contacts Ears, nose, mouth, throat, and face: negative Respiratory: cough with whitish phlegm, tickle in chest Cardiovascular: DOE Gastrointestinal: blood, BRB last weekend , small amt in stool Genitourinary:very little urine Integument/breast: negative Hematologic/lymphatic: anemia Musculoskeletal:negative Endocrine: negative Allergic/Immunologic: negative   Dialyzes at [facility] on TTS since 10/16. Primary Nephrologist [place]. EDW unknown. Has [facility] to get eval at [facility] for access.  Has put off.  Will get HD records.  Past Medical History  Diagnosis Date  . Hypertension   . Hyperlipidemia   . Chronic kidney disease   . OSA (obstructive sleep apnea) 03/27/2013  . Blood transfusion without reported diagnosis     Past Surgical History  Procedure Laterality Date  . Kidney transplant      Family History  Problem Relation Age of Onset  . Heart disease Mother   . Cancer Maternal Grandmother   . Cancer Maternal Grandfather   . Asthma Son   . Asthma Son     Social History:  reports that he has never smoked. He has never used smokeless tobacco. He reports that he does not drink alcohol or use illicit drugs.  Allergies: No Known Allergies  Medications:  I have reviewed the patient's current medications. Prior to Admission:  Prescriptions prior to  admission  Medication Sig Dispense Refill Last Dose  . amLODipine (NORVASC) 10 MG tablet Take 10 mg by mouth daily.   11/02/2015 at Unknown time  . calcitRIOL (ROCALTROL) 0.5 MCG capsule Take 1 capsule (0.5 mcg total) by mouth Every Tuesday,Thursday,and Saturday with dialysis. 15 capsule 0 11/02/2015  . carvedilol (COREG) 25 MG tablet Take 25 mg by mouth every 12 (twelve) hours.  0 11/02/2015 at 0830  . cefTAZidime 2 g in dextrose 5 % 50 mL Inject 2 g into the vein Every Tuesday,Thursday,and Saturday with dialysis. Stop after last dose on HD 09/25/15 1 vial 0 10/30/2015  . cinacalcet (SENSIPAR) 30 MG tablet Take 30 mg by mouth daily.     . hydrALAZINE (APRESOLINE) 50 MG tablet Take 1 tablet (50 mg total) by mouth every 8 (eight) hours. 90 tablet 1 11/02/2015 at Unknown time  . sevelamer carbonate (RENVELA) 800 MG tablet Take 1,600 mg by mouth 3 (three) times daily.   11/02/2015 at am  . simvastatin (ZOCOR) 5 MG tablet Take 1 tablet (5 mg total) by mouth at bedtime. 30 tablet 0 11/01/2015 at Unknown time   , Calcitriol .36mg. Micera ??dose , Venofer received Tues  Results for orders placed or performed during the hospital encounter of 11/02/15 (from the past 48 hour(s))  Type and screen MAnna Maria    Status: None (Preliminary result)   Collection Time: 11/02/15  4:21 PM  Result Value Ref Range   ABO/RH(D)  A POS    Antibody Screen NEG    Sample Expiration 11/05/2015    Unit Number [id number]    Blood Component Type RED CELLS,LR    Unit division 00    Status of Unit ISSUED    Transfusion Status OK TO TRANSFUSE    Crossmatch Result Compatible   Comprehensive metabolic panel     Status: Abnormal   Collection Time: 11/02/15  4:29 PM  Result Value Ref Range   Sodium 140 135 - 145 mmol/L   Potassium 4.6 3.5 - 5.1 mmol/L   Chloride 98 (L) 101 - 111 mmol/L   CO2 31 22 - 32 mmol/L   Glucose, Bld 129 (H) 65 - 99 mg/dL   BUN 8 6 - 20 mg/dL   Creatinine, Ser 4.58 (H) 0.61 - 1.24  mg/dL   Calcium 9.2 8.9 - 10.3 mg/dL   Total Protein 8.0 6.5 - 8.1 g/dL   Albumin 3.3 (L) 3.5 - 5.0 g/dL   AST 23 15 - 41 U/L   ALT 9 (L) 17 - 63 U/L   Alkaline Phosphatase 81 38 - 126 U/L   Total Bilirubin 0.8 0.3 - 1.2 mg/dL   GFR calc non Af Amer 14 (L) >60 mL/min   GFR calc Af Amer 16 (L) >60 mL/min    Comment: (NOTE) The eGFR has been calculated using the CKD EPI equation. This calculation has not been validated in all clinical situations. eGFR's persistently <60 mL/min signify possible Chronic Kidney Disease.    Anion gap 11 5 - 15  CBC     Status: Abnormal   Collection Time: 11/02/15  4:29 PM  Result Value Ref Range   WBC 4.7 4.0 - 10.5 K/uL   RBC 2.55 (L) 4.22 - 5.81 MIL/uL   Hemoglobin 6.8 (LL) 13.0 - 17.0 g/dL    Comment: REPEATED TO VERIFY CRITICAL RESULT CALLED TO, READ BACK BY AND VERIFIED WITH: BRANCH,J RN @1649  BY GRINSTEAD,C 1.17.17    HCT 20.5 (L) 39.0 - 52.0 %   MCV 80.4 78.0 - 100.0 fL   MCH 26.7 26.0 - 34.0 pg   MCHC 33.2 30.0 - 36.0 g/dL   RDW 19.5 (H) 11.5 - 15.5 %   Platelets 123 (L) 150 - 400 K/uL  Prepare RBC     Status: None   Collection Time: 11/02/15 10:39 PM  Result Value Ref Range   Order Confirmation ORDER PROCESSED BY BLOOD BANK   POC occult blood, ED RN will collect     Status: Abnormal   Collection Time: 11/03/15 12:48 AM  Result Value Ref Range   Fecal Occult Bld POSITIVE (A) NEGATIVE  Folate     Status: None   Collection Time: 11/03/15 12:58 AM  Result Value Ref Range   Folate 7.7 >5.9 ng/mL  Vitamin B12     Status: None   Collection Time: 11/03/15  1:12 AM  Result Value Ref Range   Vitamin B-12 358 180 - 914 pg/mL    Comment: (NOTE) This assay is not validated for testing neonatal or myeloproliferative syndrome specimens for Vitamin B12 levels.   Iron and TIBC     Status: Abnormal   Collection Time: 11/03/15  1:12 AM  Result Value Ref Range   Iron 69 45 - 182 ug/dL   TIBC 176 (L) 250 - 450 ug/dL   Saturation Ratios 39 17.9  - 39.5 %   UIBC 107 ug/dL  Ferritin     Status: Abnormal   Collection Time: 11/03/15  1:12 AM  Result Value Ref Range   Ferritin 491 (H) 24 - 336 ng/mL  Reticulocytes     Status: Abnormal   Collection Time: 11/03/15  1:12 AM  Result Value Ref Range   Retic Ct Pct 1.4 0.4 - 3.1 %   RBC. 2.04 (L) 4.22 - 5.81 MIL/uL   Retic Count, Manual 28.6 19.0 - 186.0 K/uL  Protime-INR     Status: Abnormal   Collection Time: 11/03/15  1:13 AM  Result Value Ref Range   Prothrombin Time 15.3 (H) 11.6 - 15.2 seconds   INR 1.19 0.00 - 1.49  APTT     Status: Abnormal   Collection Time: 11/03/15  1:13 AM  Result Value Ref Range   aPTT 38 (H) 24 - 37 seconds    Comment:        IF BASELINE aPTT IS ELEVATED, SUGGEST PATIENT RISK ASSESSMENT BE USED TO DETERMINE APPROPRIATE ANTICOAGULANT THERAPY.   MRSA PCR Screening     Status: None   Collection Time: 11/03/15  2:16 AM  Result Value Ref Range   MRSA by PCR NEGATIVE NEGATIVE    Comment:        The GeneXpert MRSA Assay (FDA approved for NASAL specimens only), is one component of a comprehensive MRSA colonization surveillance program. It is not intended to diagnose MRSA infection nor to guide or monitor treatment for MRSA infections.     Dg Chest 2 View  11/02/2015  CLINICAL DATA:  Productive cough for 1 week.  Mid chest pain. EXAM: CHEST  2 VIEW COMPARISON:  09/21/2015 FINDINGS: There is a large right pleural effusion. Right basilar opacity likely reflects atelectasis. Left lung is clear. Mild cardiomegaly. Right dialysis catheter remains in place, unchanged. No acute bony abnormality. IMPRESSION: Large right pleural effusion, increasing since prior study. Right base atelectasis. Electronically Signed   By: Rolm Baptise M.D.   On: 11/02/2015 18:01    ROS Blood pressure 171/89, pulse 112, temperature 97.9 F (36.6 C), temperature source Oral, resp. rate 16, height 5' 10"  (1.778 m), weight 76.5 kg (168 lb 10.4 oz), SpO2 97 %. Physical  Exam Physical Examination: General appearance - alert, well appearing, and in no distress Mental status - alert, oriented to person, place, and time Eyes - Hypertensive retinopathy Mouth - mucous membranes moist, pharynx normal without lesions and dental hygiene poor Neck - adenopathy noted PCL Lymphatics - posterior cervical nodes Chest - decreased bs R Lung to upper lung field. Rales above Heart - S1 and S2 normal, systolic murmur IO9/7 at apex and LV lift Abdomen - soft, liver down 5 cm, pos bs. Old TX RLQ Musculoskeletal - no joint tenderness, deformity or swelling Extremities - pedal edema 1 + Skin - scars on abdm from PD, TX, arms from access  Assessment/Plan: 1 Anemia has had Hb in 6s for 3 wk by records. Not on max esa,  Fe ok. Cont.  ? Loss. Doubt much,suspect inadequate esa. 2 ESRD: xs vol needs dry lowered signif 3 Hypertension: xs vol, cont med 4. Anemia of ESRD: as above 5. Metabolic Bone Disease: on vit D 6 NONADHERENCE 7 lack of access 8 Pleural effusion suspect xs vol , r/o infx P HD, lower vol, bp meds, max esa, give Fe, thoracentesis.  Prescilla Monger L 11/03/2015, 10:43 AM

## 2015-11-03 NOTE — Progress Notes (Signed)
Called ED RN for report. Room ready.

## 2015-11-03 NOTE — Progress Notes (Signed)
Patient hg 6.3. Patient denies further episodes of Gi bleeding. D/w patient at length about bleeding and need for further investigations, endoscopies. He understood and declined further GI procedures at this time -we will Tf 1 more unit PRBC. Cont close monitor TF as needed Consuella Scurlock N

## 2015-11-04 DIAGNOSIS — I10 Essential (primary) hypertension: Secondary | ICD-10-CM

## 2015-11-04 DIAGNOSIS — D649 Anemia, unspecified: Secondary | ICD-10-CM

## 2015-11-04 DIAGNOSIS — J948 Other specified pleural conditions: Secondary | ICD-10-CM

## 2015-11-04 DIAGNOSIS — N186 End stage renal disease: Secondary | ICD-10-CM

## 2015-11-04 DIAGNOSIS — Z992 Dependence on renal dialysis: Secondary | ICD-10-CM

## 2015-11-04 LAB — TYPE AND SCREEN
ABO/RH(D): A POS
ANTIBODY SCREEN: NEGATIVE
UNIT DIVISION: 0
UNIT DIVISION: 0

## 2015-11-04 LAB — CBC
HEMATOCRIT: 24.1 % — AB (ref 39.0–52.0)
Hemoglobin: 7.8 g/dL — ABNORMAL LOW (ref 13.0–17.0)
MCH: 26.8 pg (ref 26.0–34.0)
MCHC: 32.4 g/dL (ref 30.0–36.0)
MCV: 82.8 fL (ref 78.0–100.0)
Platelets: 116 10*3/uL — ABNORMAL LOW (ref 150–400)
RBC: 2.91 MIL/uL — ABNORMAL LOW (ref 4.22–5.81)
RDW: 17.8 % — AB (ref 11.5–15.5)
WBC: 6 10*3/uL (ref 4.0–10.5)

## 2015-11-04 LAB — RENAL FUNCTION PANEL
ALBUMIN: 2.8 g/dL — AB (ref 3.5–5.0)
Anion gap: 10 (ref 5–15)
BUN: 12 mg/dL (ref 6–20)
CHLORIDE: 100 mmol/L — AB (ref 101–111)
CO2: 30 mmol/L (ref 22–32)
Calcium: 8 mg/dL — ABNORMAL LOW (ref 8.9–10.3)
Creatinine, Ser: 5.43 mg/dL — ABNORMAL HIGH (ref 0.61–1.24)
GFR calc Af Amer: 13 mL/min — ABNORMAL LOW (ref >60)
GFR calc non Af Amer: 11 mL/min — ABNORMAL LOW (ref >60)
GLUCOSE: 87 mg/dL (ref 65–99)
PHOSPHORUS: 2.8 mg/dL (ref 2.5–4.6)
POTASSIUM: 3.9 mmol/L (ref 3.5–5.1)
Sodium: 140 mmol/L (ref 135–145)

## 2015-11-04 LAB — HEMOGLOBIN AND HEMATOCRIT, BLOOD
HCT: 23.5 % — ABNORMAL LOW (ref 39.0–52.0)
HEMOGLOBIN: 7.7 g/dL — AB (ref 13.0–17.0)

## 2015-11-04 MED ORDER — ONDANSETRON HCL 4 MG/2ML IJ SOLN
INTRAMUSCULAR | Status: AC
  Filled 2015-11-04: qty 2

## 2015-11-04 MED ORDER — PANTOPRAZOLE SODIUM 40 MG PO TBEC: 40.0000 mg | DELAYED_RELEASE_TABLET | Freq: Every day | ORAL | Status: DC

## 2015-11-04 MED ORDER — SODIUM CHLORIDE 0.9 % IV SOLN: 100.0000 mL | INTRAVENOUS | Status: DC | PRN

## 2015-11-04 MED ORDER — LIDOCAINE HCL (PF) 1 % IJ SOLN: 5.0000 mL | INTRAMUSCULAR | Status: DC | PRN

## 2015-11-04 MED ORDER — LIDOCAINE-PRILOCAINE 2.5-2.5 % EX CREA: 1.0000 "application " | TOPICAL_CREAM | CUTANEOUS | Status: DC | PRN

## 2015-11-04 MED ORDER — ALTEPLASE 2 MG IJ SOLR
2.0000 mg | Freq: Once | INTRAMUSCULAR | Status: DC | PRN
  Filled 2015-11-04: qty 2

## 2015-11-04 MED ORDER — CALCITRIOL 0.5 MCG PO CAPS
ORAL_CAPSULE | ORAL | Status: AC
  Filled 2015-11-04: qty 1

## 2015-11-04 MED ORDER — DARBEPOETIN ALFA 200 MCG/0.4ML IJ SOSY
PREFILLED_SYRINGE | INTRAMUSCULAR | Status: AC
  Filled 2015-11-04: qty 0.4

## 2015-11-04 MED ORDER — HEPARIN SODIUM (PORCINE) 1000 UNIT/ML DIALYSIS
40.0000 [IU]/kg | Freq: Once | INTRAMUSCULAR | Status: AC
  Administered 2015-11-04: 3100 [IU] via INTRAVENOUS_CENTRAL

## 2015-11-04 MED ORDER — PENTAFLUOROPROP-TETRAFLUOROETH EX AERO: 1.0000 "application " | INHALATION_SPRAY | CUTANEOUS | Status: DC | PRN

## 2015-11-04 MED ORDER — HEPARIN SODIUM (PORCINE) 1000 UNIT/ML DIALYSIS: 1000.0000 [IU] | INTRAMUSCULAR | Status: DC | PRN

## 2015-11-04 NOTE — Progress Notes (Signed)
Subjective: Interval History: has no complaint .  Objective: Vital signs in last 24 hours: Temp:  [97.8 F (36.6 C)-98.4 F (36.9 C)] 97.8 F (36.6 C) (01/19 0730) Pulse Rate:  [90-100] 100 (01/19 0930) Resp:  [13-20] 14 (01/19 0930) BP: (136-188)/(79-119) 172/98 mmHg (01/19 0930) SpO2:  [100 %] 100 % (01/19 0730) Weight:  [72.8 kg (160 lb 7.9 oz)-74.5 kg (164 lb 3.9 oz)] 72.8 kg (160 lb 7.9 oz) (01/19 0730) Weight change: -2 kg (-4 lb 6.6 oz)  Intake/Output from previous day: 01/18 0701 - 01/19 0700 In: 815 [P.O.:480; Blood:335] Out: 0  Intake/Output this shift:    General appearance: cooperative, slowed mentation and sleepy Chest wall: RIJ PC Cardio: S1, S2 normal and systolic murmur: holosystolic 2/6, blowing at apex, LV lift GI: pos bs, soft, TX RLQ, liver down 6 cm Extremities: edema 1+  Lab Results:  Recent Labs  11/03/15 1100  11/04/15 0529 11/04/15 0845  WBC 4.7  --   --  6.0  HGB 6.3*  < > 7.7* 7.8*  HCT 19.1*  < > 23.5* 24.1*  PLT 100*  --   --  PENDING  < > = values in this interval not displayed. BMET:  Recent Labs  11/02/15 1629 11/03/15 1100  NA 140 139  K 4.6 4.5  CL 98* 104  CO2 31 29  GLUCOSE 129* 89  BUN 8 15  CREATININE 4.58* 7.14*  CALCIUM 9.2 8.5*   No results for input(s): PTH in the last 72 hours. Iron Studies:  Recent Labs  11/03/15 0112  IRON 69  TIBC 176*  FERRITIN 491*    Studies/Results: Dg Chest 1 View  11/03/2015  CLINICAL DATA:  Status post right thoracentesis. EXAM: CHEST 1 VIEW COMPARISON:  11/02/2015 FINDINGS: Reduced amount of right pleural fluid with component of pleural effusion remaining. No pneumothorax. Dialysis catheter shows stable positioning. No edema. No left pleural effusion. IMPRESSION: Decreased volume of right pleural fluid after thoracentesis. No pneumothorax. Some right pleural fluid remains. Electronically Signed   By: Aletta Edouard M.D.   On: 11/03/2015 13:00   Dg Chest 2 View  11/02/2015   CLINICAL DATA:  Productive cough for 1 week.  Mid chest pain. EXAM: CHEST  2 VIEW COMPARISON:  09/21/2015 FINDINGS: There is a large right pleural effusion. Right basilar opacity likely reflects atelectasis. Left lung is clear. Mild cardiomegaly. Right dialysis catheter remains in place, unchanged. No acute bony abnormality. IMPRESSION: Large right pleural effusion, increasing since prior study. Right base atelectasis. Electronically Signed   By: Rolm Baptise M.D.   On: 11/02/2015 18:01   US Thoracentesis Asp Pleural Space W/img Guide  11/03/2015  INDICATION: Symptomatic Right sided pleural effusion EXAM: US THORACENTESIS ASP PLEURAL SPACE W/IMG GUIDE COMPARISON:  None. MEDICATIONS: 10 cc 1% lidocaine. COMPLICATIONS: None immediate. TECHNIQUE: Informed written consent was obtained from the patient after a discussion of the risks, benefits and alternatives to treatment. A timeout was performed prior to the initiation of the procedure. Initial ultrasound scanning demonstrates a Right pleural effusion. The lower chest was prepped and draped in the usual sterile fashion. 1% lidocaine was used for local anesthesia. Under direct ultrasound guidance, a 19 gauge, 7-cm, Yueh catheter was introduced. An ultrasound image was saved for documentation purposes. The thoracentesis was performed. The catheter was removed and a dressing was applied. The patient tolerated the procedure well without immediate post procedural complication. The patient was escorted to have an upright chest radiograph. FINDINGS: A total of approximately 1.4  liters of yellow fluid was removed. Requested samples were sent to the laboratory. IMPRESSION: Successful ultrasound-guided R sided thoracentesis yielding 1.4 liters of pleural fluid. Read by:  Lavonia Drafts [facility] Electronically Signed   By: Corrie Mckusick D.O.   On: 11/03/2015 14:33    I have reviewed the patient's current medications.  Assessment/Plan: 1 ESRD vol xs.  BP^. Lower at HD. Needs  progressive lowering of dry 2 Anemia refuses GI eval.  Too low of ESA contrib 3 HPTH on meds 4 HTN meds, lower vol 5 NONADHERENCE. P HD, to be D/C , lower dry. ^ esa    LOS: 2 days   Calbert Hulsebus L 11/04/2015,9:35 AM

## 2015-11-04 NOTE — Discharge Summary (Addendum)
Physician Discharge Summary  Jonathon Conley LMB:[id number] DOB: June 07, 1966 DOA: 11/02/2015  PCP: Pcp Not In System  Admit date: 11/02/2015 Discharge date: 11/04/2015  Time spent: >35 minutes  Recommendations for Outpatient Follow-up:  F/u with HD as scheduled  F/u with PCP in 1 week as needed  Discharge Diagnoses:  Principal Problem:   Symptomatic anemia Active Problems:   Renal transplant recipient   ESRD on dialysis [facility])   Accelerated hypertension   Shortness of breath   Chronic anemia   Thrombocytopenia (HCC)   Pleural effusion on right   Discharge Condition: stable   Diet recommendation: renal   Filed Weights   11/04/15 0500 11/04/15 0730 11/04/15 1130  Weight: 74.5 kg (164 lb 3.9 oz) 72.8 kg (160 lb 7.9 oz) 70.7 kg (155 lb 13.8 oz)    History of present illness:  50 y.o. gentleman with a history of ESRD on HD T/T/S, history of renal transplant (failed after 15 years), HTN, HLD, asthma, and OSA (currently not compliant with CPAP) who was referred to the ED from his HD unit in [facility] for evaluation of anemia. The patient has a known history of anemia (presumably related to chronic renal disease and/or iron deficiency; he reports that he has been on iron supplementation in the past). He has had one episode of BRBPR last week, but he denies any known history of internal or external hemorrhoids. No hematemesis   Hospital Course:  1. Acute on chronic anemia, symptomatic, with history of one episode BRBPR prior to admission.  -Pt transfused 2 unit PRBC.  No s/s of acute bleeding. D/w patient at length about bleeding and need for further investigations, endoscopies. He understood and declined further GI procedures at this time 2. Right pleural effusion, with a question of whether this finding is contributing to chronic cough and/or anemia and shortness of breath -s/p US thoracentesis 1.4L prelim no organisms and culture neg. Recommended to f/u with PCP 3. ESRD on HD. Dr.  Posey Pronto aware of this patient's admission. cont HD per nephrology. -anemia of chronic disease, renal.  epo/iron injection with HD 4. Accelerated HTN. Resolved after resuming home meds, and HD      Procedures:  HD (i.e. Studies not automatically included, echos, thoracentesis, etc; not x-rays)  Consultations:  Nephrology   Discharge Exam: Filed Vitals:   11/04/15 1100 11/04/15 1130  BP: 133/79 155/85  Pulse: 86 92  Temp:  98.2 F (36.8 C)  Resp: 18 20    General: alert, no distress  Cardiovascular: s1,s2 rrr Respiratory: CTA BL  Discharge Instructions  Discharge Instructions    Diet - low sodium heart healthy    Complete by:  As directed      Discharge instructions    Complete by:  As directed   Please follow up with primary care doctor i n 1 week     Increase activity slowly    Complete by:  As directed             Medication List    STOP taking these medications        cefTAZidime 2 g in dextrose 5 % 50 mL      TAKE these medications        amLODipine 10 MG tablet  Commonly known as:  NORVASC  Take 10 mg by mouth daily.     calcitRIOL 0.5 MCG capsule  Commonly known as:  ROCALTROL  Take 1 capsule (0.5 mcg total) by mouth Every Tuesday,Thursday,and Saturday with dialysis.  carvedilol 25 MG tablet  Commonly known as:  COREG  Take 25 mg by mouth every 12 (twelve) hours.     cinacalcet 30 MG tablet  Commonly known as:  SENSIPAR  Take 30 mg by mouth daily.     hydrALAZINE 50 MG tablet  Commonly known as:  APRESOLINE  Take 1 tablet (50 mg total) by mouth every 8 (eight) hours.     pantoprazole 40 MG tablet  Commonly known as:  PROTONIX  Take 1 tablet (40 mg total) by mouth daily.     sevelamer carbonate 800 MG tablet  Commonly known as:  RENVELA  Take 1,600 mg by mouth 3 (three) times daily.     simvastatin 5 MG tablet  Commonly known as:  ZOCOR  Take 1 tablet (5 mg total) by mouth at bedtime.       No Known Allergies    The  results of significant diagnostics from this hospitalization (including imaging, microbiology, ancillary and laboratory) are listed below for reference.    Significant Diagnostic Studies: Dg Chest 1 View  11/03/2015  CLINICAL DATA:  Status post right thoracentesis. EXAM: CHEST 1 VIEW COMPARISON:  11/02/2015 FINDINGS: Reduced amount of right pleural fluid with component of pleural effusion remaining. No pneumothorax. Dialysis catheter shows stable positioning. No edema. No left pleural effusion. IMPRESSION: Decreased volume of right pleural fluid after thoracentesis. No pneumothorax. Some right pleural fluid remains. Electronically Signed   By: Aletta Edouard M.D.   On: 11/03/2015 13:00   Dg Chest 2 View  11/02/2015  CLINICAL DATA:  Productive cough for 1 week.  Mid chest pain. EXAM: CHEST  2 VIEW COMPARISON:  09/21/2015 FINDINGS: There is a large right pleural effusion. Right basilar opacity likely reflects atelectasis. Left lung is clear. Mild cardiomegaly. Right dialysis catheter remains in place, unchanged. No acute bony abnormality. IMPRESSION: Large right pleural effusion, increasing since prior study. Right base atelectasis. Electronically Signed   By: Rolm Baptise M.D.   On: 11/02/2015 18:01   US Thoracentesis Asp Pleural Space W/img Guide  11/03/2015  INDICATION: Symptomatic Right sided pleural effusion EXAM: US THORACENTESIS ASP PLEURAL SPACE W/IMG GUIDE COMPARISON:  None. MEDICATIONS: 10 cc 1% lidocaine. COMPLICATIONS: None immediate. TECHNIQUE: Informed written consent was obtained from the patient after a discussion of the risks, benefits and alternatives to treatment. A timeout was performed prior to the initiation of the procedure. Initial ultrasound scanning demonstrates a Right pleural effusion. The lower chest was prepped and draped in the usual sterile fashion. 1% lidocaine was used for local anesthesia. Under direct ultrasound guidance, a 19 gauge, 7-cm, Yueh catheter was introduced. An  ultrasound image was saved for documentation purposes. The thoracentesis was performed. The catheter was removed and a dressing was applied. The patient tolerated the procedure well without immediate post procedural complication. The patient was escorted to have an upright chest radiograph. FINDINGS: A total of approximately 1.4 liters of yellow fluid was removed. Requested samples were sent to the laboratory. IMPRESSION: Successful ultrasound-guided R sided thoracentesis yielding 1.4 liters of pleural fluid. Read by:  Lavonia Drafts [facility] Electronically Signed   By: Corrie Mckusick D.O.   On: 11/03/2015 14:33    Microbiology: Recent Results (from the past 240 hour(s))  MRSA PCR Screening     Status: None   Collection Time: 11/03/15  2:16 AM  Result Value Ref Range Status   MRSA by PCR NEGATIVE NEGATIVE Final    Comment:        The  GeneXpert MRSA Assay (FDA approved for NASAL specimens only), is one component of a comprehensive MRSA colonization surveillance program. It is not intended to diagnose MRSA infection nor to guide or monitor treatment for MRSA infections.   Culture, body fluid-bottle     Status: None (Preliminary result)   Collection Time: 11/03/15 11:57 AM  Result Value Ref Range Status   Specimen Description FLUID RIGHT PLEURAL  Final   Special Requests BOTTLES DRAWN AEROBIC AND ANAEROBIC 10MLS  Final   Culture PENDING  Incomplete   Report Status PENDING  Incomplete  Gram stain     Status: None   Collection Time: 11/03/15 11:57 AM  Result Value Ref Range Status   Specimen Description FLUID RIGHT PLEURAL  Final   Special Requests NONE  Final   Gram Stain   Final    FEW WBC PRESENT, PREDOMINANTLY PMN NO ORGANISMS SEEN    Report Status 11/03/2015 FINAL  Final     Labs: Basic Metabolic Panel:  Recent Labs Lab 11/02/15 1629 11/03/15 1100 11/04/15 0845  NA 140 139 140  K 4.6 4.5 3.9  CL 98* 104 100*  CO2 31 29 30   GLUCOSE 129* 89 87  BUN 8 15 12   CREATININE  4.58* 7.14* 5.43*  CALCIUM 9.2 8.5* 8.0*  PHOS  --   --  2.8   Liver Function Tests:  Recent Labs Lab 11/02/15 1629 11/04/15 0845  AST 23  --   ALT 9*  --   ALKPHOS 81  --   BILITOT 0.8  --   PROT 8.0  --   ALBUMIN 3.3* 2.8*   No results for input(s): LIPASE, AMYLASE in the last 168 hours. No results for input(s): AMMONIA in the last 168 hours. CBC:  Recent Labs Lab 11/02/15 1629 11/03/15 1100 11/03/15 1434 11/03/15 2130 11/04/15 0529 11/04/15 0845  WBC 4.7 4.7  --   --   --  6.0  HGB 6.8* 6.3* 6.4* 7.7* 7.7* 7.8*  HCT 20.5* 19.1* 20.0* 24.3* 23.5* 24.1*  MCV 80.4 82.3  --   --   --  82.8  PLT 123* 100*  --   --   --  116*   Cardiac Enzymes: No results for input(s): CKTOTAL, CKMB, CKMBINDEX, TROPONINI in the last 168 hours. BNP: BNP (last 3 results)  Recent Labs  02/14/15 1755 09/21/15 1946  BNP 1305.9* 2596.3*    ProBNP (last 3 results)  Recent Labs  02/14/15 1612  PROBNP 22230.00*    CBG: No results for input(s): GLUCAP in the last 168 hours.     SignedKinnie Feil  [facility] 11/04/2015, 12:20 PM

## 2015-11-04 NOTE — Procedures (Signed)
I was present at this session.  I have reviewed the session itself and made appropriate changes.  Cath flow 400. bp ^, lower vol  Arnice Vanepps L 1/19/20179:39 AM

## 2015-11-04 NOTE — Progress Notes (Signed)
Patient Discharge: Disposition: Patient discharged to home with wife. Education: Reviewed all the medications, prescriptions, follow-up appointments, and discharge instructions with the patient, understood and acknowledged. IV: Discontinued IV before discharge. Telemetry: Discontinued Tele before discharge, CCMD notified. Transportation: Patient transferred in w/c out of the unit accompanied by staff and wife. Belongings: Patient took all his belongings with him.

## 2015-11-04 NOTE — Progress Notes (Signed)
Patient while going over discharge order instructions told that he doesn't have any PCP, informed MD and also case manager.  Case Manager made an appointment with [facility] for February 16th at 1:45 pm.  Information note given to the patient about the appointment.

## 2015-11-04 NOTE — Clinical Documentation Improvement (Signed)
Hospitalist  Can the diagnosis of anemia be further specified?   Acute Blood Loss Anemia, including the suspected or known cause  Nutritional anemia, including the nutrition or mineral deficits  Acute on Chronic Blood Loss Anemia, including the suspected or known cause  Anemia of chronic disease, including the associated chronic disease state  Other  Clinically Undetermined  Document any associated diagnoses/conditions.  Supporting Information: Acute on chronic, symptomatic anemia, with a history of BRBPR per 01/18 progress notes. Transfused 1 unit of PRBC per 01/18 ED note. H/H: 01/17:  6.8/20.5. 01/18:  6.3/19.1. 01/19:  7.8/24.1.   Please exercise your independent, professional judgment when responding. A specific answer is not anticipated or expected.   Thank You,  [place] [phone]

## 2015-11-08 LAB — CULTURE, BODY FLUID-BOTTLE: CULTURE: NO GROWTH

## 2015-11-08 LAB — CULTURE, BODY FLUID W GRAM STAIN -BOTTLE

## 2015-12-01 ENCOUNTER — Telehealth: Payer: Self-pay

## 2015-12-01 NOTE — Telephone Encounter (Signed)
Left message for patient to call to reschedule appointment from 12/02/15.

## 2015-12-02 ENCOUNTER — Ambulatory Visit: Payer: Self-pay | Admitting: Family Medicine

## 2015-12-06 ENCOUNTER — Encounter: Payer: Self-pay | Admitting: Family Medicine

## 2015-12-06 ENCOUNTER — Ambulatory Visit (INDEPENDENT_AMBULATORY_CARE_PROVIDER_SITE_OTHER): Payer: Self-pay | Admitting: Family Medicine

## 2015-12-06 VITALS — BP 202/136 | HR 84 | Temp 98.2°F | Resp 16 | Ht 70.0 in | Wt 170.0 lb

## 2015-12-06 DIAGNOSIS — E785 Hyperlipidemia, unspecified: Secondary | ICD-10-CM

## 2015-12-06 DIAGNOSIS — Z992 Dependence on renal dialysis: Secondary | ICD-10-CM

## 2015-12-06 DIAGNOSIS — I10 Essential (primary) hypertension: Secondary | ICD-10-CM

## 2015-12-06 DIAGNOSIS — N186 End stage renal disease: Secondary | ICD-10-CM

## 2015-12-06 MED ORDER — CLONIDINE HCL 0.1 MG PO TABS
0.2000 mg | ORAL_TABLET | Freq: Once | ORAL | Status: AC
  Administered 2015-12-06: 0.2 mg via ORAL

## 2015-12-06 NOTE — Progress Notes (Signed)
Subjective:    Jonathon Conley ID: Jonathon Conley, male    DOB: [birth year], 50 y.o.   MRN: [id number]  HPI Jonathon Conley, a 50 year old male with a history of ESRD and accelerated hypertension presents accompanied by wife to establish care. Jonathon Conley states that he had a primary provider many years ago, but  has been lost to follow-up due to lack of payer source. Jonathon Conley reports that he had a kidney transplant in 2001, which failed, so he requires dialysis.. Jonathon Conley was previously seen at [facility] in October 2007 was lost to follow-up for several years.   He is currently dialyzed on Tuesday, Thursday, and Saturday at the [facility]. He states that he is taking all medications consistently.  Jonathon Conley has a markedly elevated blood pressure on arrival. Jonathon Conley is asymptomatic at present. He appears calm and is not in distress. He denies dizziness, headache, fatigue, chest pain, palpitations, edema to upper/lower extremities, weakness, or near syncope. Jonathon Conley denies confusion, but has had difficulty falling asleep.  He has taken all blood pressure medications today.  He does not check blood pressure at home. His wife prepares his meals and ensures that they are low fat, low sodium. She maintains that she is very careful when preparing his food and has been educated on meal prep.   Notified dialysis clinic, spoke with Dr. Rockwell Germany, nephrologist who stated that he and other providers have been adjusting anti-hypertensive medications over the past several weeks. Blood pressure continues to be markedly elevated. Dr. Rockwell Germany plans to adjust medications further on 12/07/2015. He does not recommend that Jonathon Conley follow up in the emergency department at this time being that he is not having symptoms at present.  Past Medical History  Diagnosis Date  . Hypertension   . Hyperlipidemia   . Chronic kidney disease   . OSA (obstructive sleep apnea) 03/27/2013  . Blood transfusion without  reported diagnosis    Past Surgical History  Procedure Laterality Date  . Kidney transplant     Immunization History  Administered Date(s) Administered  . Pneumococcal Polysaccharide-23 02/16/2015   Social History   Social History  . Marital Status: Single    Spouse Name: N/A  . Number of Children: N/A  . Years of Education: N/A   Occupational History  . Not on file.   Social History Main Topics  . Smoking status: Never Smoker   . Smokeless tobacco: Never Used  . Alcohol Use: No  . Drug Use: No  . Sexual Activity: Not on file   Other Topics Concern  . Not on file   Social History Narrative   Review of Systems  Constitutional: Negative.  Negative for fever and fatigue.  HENT: Negative.   Eyes: Negative.  Negative for photophobia.  Respiratory: Negative.  Negative for apnea, cough, chest tightness, shortness of breath, wheezing and stridor.   Cardiovascular: Negative.   Gastrointestinal: Negative.   Endocrine: Negative.  Negative for polydipsia, polyphagia and polyuria.  Genitourinary: Negative.   Musculoskeletal: Negative.   Skin: Negative.   Neurological: Negative.  Negative for dizziness, tremors, weakness, light-headedness, numbness and headaches.  Hematological: Negative.   Psychiatric/Behavioral: Negative.       Objective:   Physical Exam  Constitutional: He is oriented to person, place, and time. He appears well-developed and well-nourished.    HENT:  Head: Normocephalic and atraumatic.  Right Ear: External ear normal.  Left Ear: External ear normal.  Nose: Nose normal.  Mouth/Throat: Oropharynx  is clear and moist.  Eyes: Conjunctivae and EOM are normal. Pupils are equal, round, and reactive to light.  Neck: Normal range of motion. Neck supple.  Cardiovascular: Normal rate, regular rhythm, normal heart sounds and intact distal pulses.   Double Lumen Catheter to right upper chest  Pulmonary/Chest: Effort normal and breath sounds normal.  Abdominal:  Soft. Bowel sounds are normal.  Musculoskeletal: Normal range of motion.  Neurological: He is alert and oriented to person, place, and time. He has normal reflexes.  Skin: Skin is warm and dry.  Hypopigmentation to scalp  Psychiatric: He has a normal mood and affect. His behavior is normal. Judgment and thought content normal.      BP 202/136 mmHg  Pulse 84  Temp(Src) 98.2 F (36.8 C) (Oral)  Resp 16  Ht 5' 10"  (1.778 m)  Wt 170 lb (77.111 kg)  BMI 24.39 kg/m2 Assessment & Plan:  1. Accelerated hypertension Jonathon Conley's blood pressure was markedly elevated on arrival. He is currently asymptomatic. I notified Dr. Rockwell Germany, nephrologist at [facility], who states that he has been adjusting blood pressure medications over the past several weeks. I informed Dr. Rockwell Germany that I gave Jonathon Conley Clonidine 0.2 mg in office, he agreed with current plan. He states that he will adjust anti-hypertensive medications further on 12/07/2015.  Jonathon Conley is alert, oriented, smiling, and answering all questions appropriately. Discussed accelerated hypertension with Jonathon Conley and his wife. I stressed that if Jonathon Conley develops headache, dizziness, syncope, chest pain, or shortness of breath, he should report to the emergency department.  - cloNIDine (CATAPRES) tablet 0.2 mg; Take 2 tablets (0.2 mg total) by mouth once. - Ambulatory referral to Ophthalmology  2. ESRD on dialysis [facility]) Jonathon Conley is scheduled to follow up at the [facility] on tomorrow. He is to continue as scheduled.   3. Hyperlipidemia Will schedule fasting lipid panel.  -lipid panel (future)    Routine Health Maintenance:  Jonathon Conley is scheduled to for labs at Dialysis on 12/07/2015, will have dialysis center fax labs for review.  Reviewed post hospital notes from [facility] and [place] digital rectal examination and PSA on follow up visit Jonathon Conley scheduled to follow-up with Dr. Doreene Burke in 1 month for accelerated  hypertension.  Discussed immunizations. Last Tetanus vaccination was in May 2016.  Will review medical records from nephrology as they become available.   RTC: 1 month for accelerated hypertension.    The Jonathon Conley was given clear instructions to go to ER or return to medical center if symptoms do not improve, worsen or new problems develop. The Jonathon Conley verbalized understanding. Will notify Jonathon Conley with laboratory results.    Dorena Dew, FNP

## 2015-12-14 ENCOUNTER — Emergency Department: Payer: No Typology Code available for payment source

## 2015-12-14 ENCOUNTER — Encounter: Payer: Self-pay | Admitting: Medical Oncology

## 2015-12-14 ENCOUNTER — Inpatient Hospital Stay
Admission: EM | Admit: 2015-12-14 | Discharge: 2015-12-16 | DRG: 186 | Disposition: A | Discharge status: Home / Self Care | Payer: No Typology Code available for payment source | Attending: Internal Medicine | Admitting: Internal Medicine

## 2015-12-14 DIAGNOSIS — Z809 Family history of malignant neoplasm, unspecified: Secondary | ICD-10-CM

## 2015-12-14 DIAGNOSIS — N186 End stage renal disease: Secondary | ICD-10-CM | POA: Diagnosis present

## 2015-12-14 DIAGNOSIS — I10 Essential (primary) hypertension: Secondary | ICD-10-CM | POA: Diagnosis present

## 2015-12-14 DIAGNOSIS — I248 Other forms of acute ischemic heart disease: Secondary | ICD-10-CM | POA: Diagnosis present

## 2015-12-14 DIAGNOSIS — Z7982 Long term (current) use of aspirin: Secondary | ICD-10-CM

## 2015-12-14 DIAGNOSIS — G4733 Obstructive sleep apnea (adult) (pediatric): Secondary | ICD-10-CM | POA: Diagnosis present

## 2015-12-14 DIAGNOSIS — D631 Anemia in chronic kidney disease: Secondary | ICD-10-CM | POA: Diagnosis present

## 2015-12-14 DIAGNOSIS — I160 Hypertensive urgency: Secondary | ICD-10-CM

## 2015-12-14 DIAGNOSIS — Z9889 Other specified postprocedural states: Secondary | ICD-10-CM

## 2015-12-14 DIAGNOSIS — J069 Acute upper respiratory infection, unspecified: Secondary | ICD-10-CM

## 2015-12-14 DIAGNOSIS — Z825 Family history of asthma and other chronic lower respiratory diseases: Secondary | ICD-10-CM

## 2015-12-14 DIAGNOSIS — I120 Hypertensive chronic kidney disease with stage 5 chronic kidney disease or end stage renal disease: Secondary | ICD-10-CM | POA: Diagnosis present

## 2015-12-14 DIAGNOSIS — R52 Pain, unspecified: Secondary | ICD-10-CM | POA: Insufficient documentation

## 2015-12-14 DIAGNOSIS — R7989 Other specified abnormal findings of blood chemistry: Secondary | ICD-10-CM

## 2015-12-14 DIAGNOSIS — Z992 Dependence on renal dialysis: Secondary | ICD-10-CM

## 2015-12-14 DIAGNOSIS — R778 Other specified abnormalities of plasma proteins: Secondary | ICD-10-CM

## 2015-12-14 DIAGNOSIS — E785 Hyperlipidemia, unspecified: Secondary | ICD-10-CM | POA: Diagnosis present

## 2015-12-14 DIAGNOSIS — J90 Pleural effusion, not elsewhere classified: Principal | ICD-10-CM | POA: Diagnosis present

## 2015-12-14 DIAGNOSIS — Z8249 Family history of ischemic heart disease and other diseases of the circulatory system: Secondary | ICD-10-CM

## 2015-12-14 DIAGNOSIS — N2581 Secondary hyperparathyroidism of renal origin: Secondary | ICD-10-CM | POA: Diagnosis present

## 2015-12-14 DIAGNOSIS — Z79899 Other long term (current) drug therapy: Secondary | ICD-10-CM

## 2015-12-14 DIAGNOSIS — Z940 Kidney transplant status: Secondary | ICD-10-CM

## 2015-12-14 DIAGNOSIS — R740 Nonspecific elevation of levels of transaminase and lactic acid dehydrogenase [LDH]: Secondary | ICD-10-CM | POA: Diagnosis present

## 2015-12-14 LAB — BASIC METABOLIC PANEL
ANION GAP: 13 (ref 5–15)
BUN: 24 mg/dL — ABNORMAL HIGH (ref 6–20)
CHLORIDE: 99 mmol/L — AB (ref 101–111)
CO2: 26 mmol/L (ref 22–32)
Calcium: 8.3 mg/dL — ABNORMAL LOW (ref 8.9–10.3)
Creatinine, Ser: 7.58 mg/dL — ABNORMAL HIGH (ref 0.61–1.24)
GFR calc non Af Amer: 7 mL/min — ABNORMAL LOW (ref >60)
GFR, EST AFRICAN AMERICAN: 9 mL/min — AB (ref >60)
Glucose, Bld: 92 mg/dL (ref 65–99)
POTASSIUM: 4 mmol/L (ref 3.5–5.1)
SODIUM: 138 mmol/L (ref 135–145)

## 2015-12-14 LAB — CBC
HEMATOCRIT: 37.1 % — AB (ref 40.0–52.0)
HEMOGLOBIN: 12.2 g/dL — AB (ref 13.0–18.0)
MCH: 26.6 pg (ref 26.0–34.0)
MCHC: 32.8 g/dL (ref 32.0–36.0)
MCV: 81 fL (ref 80.0–100.0)
PLATELETS: 163 10*3/uL (ref 150–440)
RBC: 4.59 MIL/uL (ref 4.40–5.90)
RDW: 19.3 % — ABNORMAL HIGH (ref 11.5–14.5)
WBC: 6.2 10*3/uL (ref 3.8–10.6)

## 2015-12-14 LAB — RAPID INFLUENZA A&B ANTIGENS (ARMC ONLY): INFLUENZA B (ARMC): NOT DETECTED — AB

## 2015-12-14 LAB — RAPID INFLUENZA A&B ANTIGENS: Influenza A (ARMC): NOT DETECTED — AB

## 2015-12-14 LAB — TROPONIN I: Troponin I: 0.05 ng/mL — ABNORMAL HIGH (ref <0.031)

## 2015-12-14 MED ORDER — AMLODIPINE BESYLATE 10 MG PO TABS
10.0000 mg | ORAL_TABLET | Freq: Every day | ORAL | Status: DC
  Administered 2015-12-15 – 2015-12-16 (×2): 10 mg via ORAL
  Filled 2015-12-14 (×2): qty 1

## 2015-12-14 MED ORDER — SODIUM CHLORIDE 0.9 % IV SOLN: 250.0000 mL | INTRAVENOUS | Status: DC | PRN

## 2015-12-14 MED ORDER — LISINOPRIL 20 MG PO TABS
40.0000 mg | ORAL_TABLET | Freq: Every day | ORAL | Status: DC
  Administered 2015-12-14 – 2015-12-15 (×2): 40 mg via ORAL
  Filled 2015-12-14 (×2): qty 2

## 2015-12-14 MED ORDER — CINACALCET HCL 30 MG PO TABS
30.0000 mg | ORAL_TABLET | Freq: Every day | ORAL | Status: DC
  Administered 2015-12-16: 30 mg via ORAL
  Filled 2015-12-14 (×2): qty 1

## 2015-12-14 MED ORDER — SEVELAMER CARBONATE 800 MG PO TABS
2400.0000 mg | ORAL_TABLET | Freq: Three times a day (TID) | ORAL | Status: DC
  Administered 2015-12-15 – 2015-12-16 (×3): 2400 mg via ORAL
  Filled 2015-12-14 (×4): qty 3

## 2015-12-14 MED ORDER — ACETAMINOPHEN 325 MG PO TABS
650.0000 mg | ORAL_TABLET | Freq: Four times a day (QID) | ORAL | Status: DC | PRN
  Administered 2015-12-14 – 2015-12-15 (×2): 650 mg via ORAL
  Filled 2015-12-14 (×2): qty 2

## 2015-12-14 MED ORDER — ACETAMINOPHEN 650 MG RE SUPP: 650.0000 mg | Freq: Four times a day (QID) | RECTAL | Status: DC | PRN

## 2015-12-14 MED ORDER — CALCITRIOL 0.25 MCG PO CAPS: 0.5000 ug | ORAL_CAPSULE | ORAL | Status: DC

## 2015-12-14 MED ORDER — SODIUM CHLORIDE 0.9% FLUSH
3.0000 mL | Freq: Two times a day (BID) | INTRAVENOUS | Status: DC
  Administered 2015-12-14 – 2015-12-16 (×4): 3 mL via INTRAVENOUS

## 2015-12-14 MED ORDER — HYDRALAZINE HCL 20 MG/ML IJ SOLN
10.0000 mg | Freq: Four times a day (QID) | INTRAMUSCULAR | Status: DC | PRN
  Administered 2015-12-14: 10 mg via INTRAVENOUS
  Filled 2015-12-14: qty 1

## 2015-12-14 MED ORDER — LEVOFLOXACIN IN D5W 750 MG/150ML IV SOLN
750.0000 mg | Freq: Once | INTRAVENOUS | Status: AC
  Administered 2015-12-14: 750 mg via INTRAVENOUS
  Filled 2015-12-14: qty 150

## 2015-12-14 MED ORDER — FERROUS SULFATE 325 (65 FE) MG PO TABS
325.0000 mg | ORAL_TABLET | Freq: Three times a day (TID) | ORAL | Status: DC
  Administered 2015-12-15 – 2015-12-16 (×4): 325 mg via ORAL
  Filled 2015-12-14 (×4): qty 1

## 2015-12-14 MED ORDER — HEPARIN SODIUM (PORCINE) 5000 UNIT/ML IJ SOLN
5000.0000 [IU] | Freq: Three times a day (TID) | INTRAMUSCULAR | Status: DC
  Administered 2015-12-14 – 2015-12-16 (×5): 5000 [IU] via SUBCUTANEOUS
  Filled 2015-12-14 (×5): qty 1

## 2015-12-14 MED ORDER — CARVEDILOL 12.5 MG PO TABS
25.0000 mg | ORAL_TABLET | Freq: Once | ORAL | Status: AC
  Administered 2015-12-14: 25 mg via ORAL
  Filled 2015-12-14: qty 2

## 2015-12-14 MED ORDER — PREDNISONE 5 MG PO TABS
5.0000 mg | ORAL_TABLET | Freq: Every day | ORAL | Status: DC
  Administered 2015-12-15 – 2015-12-16 (×2): 5 mg via ORAL
  Filled 2015-12-14 (×2): qty 1

## 2015-12-14 MED ORDER — CARVEDILOL 12.5 MG PO TABS
25.0000 mg | ORAL_TABLET | Freq: Two times a day (BID) | ORAL | Status: DC
  Administered 2015-12-15 – 2015-12-16 (×3): 25 mg via ORAL
  Filled 2015-12-14 (×3): qty 2

## 2015-12-14 MED ORDER — ONDANSETRON HCL 4 MG PO TABS: 4.0000 mg | ORAL_TABLET | Freq: Four times a day (QID) | ORAL | Status: DC | PRN

## 2015-12-14 MED ORDER — ALBUTEROL SULFATE (2.5 MG/3ML) 0.083% IN NEBU: 2.5000 mg | INHALATION_SOLUTION | RESPIRATORY_TRACT | Status: DC | PRN

## 2015-12-14 MED ORDER — SODIUM CHLORIDE 0.9% FLUSH: 3.0000 mL | INTRAVENOUS | Status: DC | PRN

## 2015-12-14 MED ORDER — ONDANSETRON HCL 4 MG/2ML IJ SOLN: 4.0000 mg | Freq: Four times a day (QID) | INTRAMUSCULAR | Status: DC | PRN

## 2015-12-14 MED ORDER — HYDRALAZINE HCL 50 MG PO TABS
100.0000 mg | ORAL_TABLET | Freq: Two times a day (BID) | ORAL | Status: DC
  Administered 2015-12-14 – 2015-12-16 (×4): 100 mg via ORAL
  Filled 2015-12-14: qty 4
  Filled 2015-12-14 (×3): qty 2

## 2015-12-14 MED ORDER — ONDANSETRON HCL 4 MG/2ML IJ SOLN
4.0000 mg | Freq: Once | INTRAMUSCULAR | Status: AC
  Administered 2015-12-14: 4 mg via INTRAVENOUS
  Filled 2015-12-14: qty 2

## 2015-12-14 MED ORDER — SIMVASTATIN 10 MG PO TABS
5.0000 mg | ORAL_TABLET | Freq: Every day | ORAL | Status: DC
  Administered 2015-12-15: 5 mg via ORAL
  Filled 2015-12-14: qty 1
  Filled 2015-12-14 (×2): qty 0.5

## 2015-12-14 MED ORDER — HYDROMORPHONE HCL 1 MG/ML IJ SOLN
0.5000 mg | Freq: Once | INTRAMUSCULAR | Status: AC
  Administered 2015-12-14: 0.5 mg via INTRAVENOUS
  Filled 2015-12-14: qty 1

## 2015-12-14 MED ORDER — LABETALOL HCL 5 MG/ML IV SOLN
10.0000 mg | Freq: Once | INTRAVENOUS | Status: AC
  Administered 2015-12-14: 10 mg via INTRAVENOUS
  Filled 2015-12-14: qty 4

## 2015-12-14 NOTE — ED Provider Notes (Signed)
[facility] Emergency Department Provider Note  ____________________________________________  Time seen: Approximately 5:20 PM  I have reviewed the triage vital signs and the nursing notes.   HISTORY  Chief Complaint Hypertension and Headache    HPI Jonathon Conley is a 50 y.o. male with a history of ESRD S/P failed renal transplant now on HD, HTN, HLpresenting for hypertension and headache. Patient reports that for the past 3 days he has had a nonproductive cough with rhinorrhea and congestion. Today he went to hemodialysis and he was hypertensive. Approximately one hour after starting, he developed a headache. He denies any other associated symptoms including visual changes, nausea or vomiting, changes in speech or gait, changes in mental status, numbness tingling or weakness. He denies any photosensitivity or phonophobia. He denies any recent chest pain, shortness of breath, abdominal pain, fever or chills, stiff neck, or tick bites. He does report that recently his hypertension has become more difficult to control and approximate 3 weeks ago he was taken off of Norvasc and placed on lisinopril. Resting and closing his eyes improves his headache.   Past Medical History  Diagnosis Date  . Hypertension   . Hyperlipidemia   . Chronic kidney disease   . OSA (obstructive sleep apnea) 03/27/2013  . Blood transfusion without reported diagnosis     Patient Active Problem List   Diagnosis Date Noted  . Symptomatic anemia 11/03/2015  . Chronic anemia 11/03/2015  . Thrombocytopenia ([place]) 11/03/2015  . Pleural effusion on right 11/03/2015  . Shortness of breath 11/02/2015  . Sepsis ([place]) 09/23/2015  . ESRD on dialysis ([place]) 09/22/2015  . Accelerated hypertension 09/22/2015  . Leg edema, left   . Pneumonia 09/21/2015  . HCAP (healthcare-associated pneumonia) 09/21/2015  . AKI (acute kidney injury) ([place]) 02/14/2015  . Renal transplant recipient 02/14/2015  . Acute  on chronic diastolic congestive heart failure ([place]) 02/14/2015  . Anemia 02/14/2015  . Hyperlipidemia   . OSA (obstructive sleep apnea) 03/27/2013    Past Surgical History  Procedure Laterality Date  . Kidney transplant      Current Outpatient Rx  Name  Route  Sig  Dispense  Refill  . Aspirin (ASPIR-81 PO)   Oral   Take by mouth.         . calcitRIOL (ROCALTROL) 0.5 MCG capsule   Oral   Take 1 capsule (0.5 mcg total) by mouth Every Tuesday,Thursday,and Saturday with dialysis.   15 capsule   0   . Calcium Citrate-Vitamin D (CALCIUM + D PO)   Oral   Take by mouth.         . carvedilol (COREG) 25 MG tablet   Oral   Take 25 mg by mouth every 12 (twelve) hours.      0   . ferrous sulfate 325 (65 FE) MG tablet   Oral   Take 325 mg by mouth daily with breakfast.         . hydrALAZINE (APRESOLINE) 50 MG tablet   Oral   Take 1 tablet (50 mg total) by mouth every 8 (eight) hours. Patient taking differently: Take 100 mg by mouth 2 (two) times daily.    90 tablet   1   . lisinopril (PRINIVIL,ZESTRIL) 30 MG tablet   Oral   Take 30 mg by mouth daily.         . predniSONE (DELTASONE) 5 MG tablet   Oral   Take 5 mg by mouth daily with breakfast.         .  sevelamer carbonate (RENVELA) 800 MG tablet   Oral   Take 800 mg by mouth 3 (three) times daily.          . simvastatin (ZOCOR) 5 MG tablet   Oral   Take 1 tablet (5 mg total) by mouth at bedtime.   30 tablet   0     Allergies Review of patient's allergies indicates no known allergies.  Family History  Problem Relation Age of Onset  . Heart disease Mother   . Cancer Maternal Grandmother   . Cancer Maternal Grandfather   . Asthma Son   . Asthma Son     Social History Social History  Substance Use Topics  . Smoking status: Never Smoker   . Smokeless tobacco: Never Used  . Alcohol Use: No    Review of Systems Constitutional: No fever/chills. No lightheadedness or syncope. No  trauma. Eyes: No visual changes. No blurred or double vision. ENT: No sore throat. Positive congestion and rhinorrhea. Negative ear pain. Cardiovascular: Denies chest pain, palpitations. Respiratory: Denies shortness of breath.  Positive nonproductive cough. Gastrointestinal: No abdominal pain.  No nausea, no vomiting.  No diarrhea.  No constipation. Genitourinary: Negative for dysuria. Musculoskeletal: Negative for back pain. Skin: Negative for rash. Neurological: Positive for headache. Negative for photosensitivity or phonophobia. Negative for numbness weakness or tickling. Negative for visual or speech changes. Negative for difficulty walking.  10-point ROS otherwise negative.  ____________________________________________   PHYSICAL EXAM:  VITAL SIGNS: ED Triage Vitals  Enc Vitals Group     BP 12/14/15 1557 212/133 mmHg     Pulse Rate 12/14/15 1557 97     Resp 12/14/15 1557 18     Temp 12/14/15 1557 98.4 F (36.9 C)     Temp Source 12/14/15 1557 Oral     SpO2 12/14/15 1557 97 %     Weight 12/14/15 1557 170 lb (77.111 kg)     Height 12/14/15 1557 5' 10"  (1.778 m)     Head Cir --      Peak Flow --      Pain Score 12/14/15 1557 10     Pain Loc --      Pain Edu? --      Excl. in [place]? --     Constitutional: Patient is alert and oriented and answering questions appropriately. He is mildly uncomfortable appearing but nontoxic.  Eyes: Conjunctivae are normal.  EOMI. PERRLA. No nystagmus. Head: Atraumatic. Nose: Positive congestion without rhinorrhea. Mouth/Throat: Mucous membranes are moist.  Neck: No stridor.  Supple.  No meningismus. Cardiovascular: Normal rate, regular rhythm. No murmurs, rubs or gallops.  Respiratory: Normal respiratory effort.  No retractions. Lungs CTAB.  No wheezes, rales or ronchi. Gastrointestinal: Soft and nontender. No distention. No peritoneal signs. Musculoskeletal: No LE edema.  Neurologic:  Alert and oriented 3. Normal speech, language and  behavior. Face is symmetric. Smile is symmetric. EOMI and PERRLA. Moves all extremities well. Normal gait without ataxia. Skin:  Skin is warm, dry and intact. No rash noted. Psychiatric: Mood and affect are normal. Speech and behavior are normal.  Normal judgement.  ____________________________________________   LABS (all labs ordered are listed, but only abnormal results are displayed)  Labs Reviewed  BASIC METABOLIC PANEL - Abnormal; Notable for the following:    Chloride 99 (*)    BUN 24 (*)    Creatinine, Ser 7.58 (*)    Calcium 8.3 (*)    GFR calc non Af Amer 7 (*)    GFR  calc Af Amer 9 (*)    All other components within normal limits  CBC - Abnormal; Notable for the following:    Hemoglobin 12.2 (*)    HCT 37.1 (*)    RDW 19.3 (*)    All other components within normal limits  TROPONIN I - Abnormal; Notable for the following:    Troponin I 0.05 (*)    All other components within normal limits  RAPID INFLUENZA A&B ANTIGENS ([facility] ONLY)  CULTURE, BLOOD (ROUTINE X 2)  CULTURE, BLOOD (ROUTINE X 2)   ____________________________________________  EKG  ED ECG REPORT I, Eula Listen, the attending physician, personally viewed and interpreted this ECG.   Date: 12/14/2015  EKG Time: 1618  Rate: 92  Rhythm: normal sinus rhythm  Axis: Normal  Intervals:none  ST&T Change: No ST elevation. Poor baseline tracing.  ____________________________________________  RADIOLOGY  Dg Chest 2 View  12/14/2015  CLINICAL DATA:  Hypertension.  Cough.  End-stage renal disease EXAM: CHEST  2 VIEW COMPARISON:  November 03, 2015 FINDINGS: There is a large right pleural effusion with consolidation throughout much of the right middle and lower lobes. The left lung is clear. Heart size is upper normal with pulmonary vascularity within normal limits. Central catheter tip is at the cavoatrial junction. No pneumothorax. No bone lesions. No demonstrable adenopathy. IMPRESSION: Large right  pleural effusion with probable compressive atelectasis right middle and lower lobes. Left lung clear. Heart upper normal in size. Electronically Signed   By: Lowella Grip III M.D.   On: 12/14/2015 18:04   Ct Head Wo Contrast  12/14/2015  CLINICAL DATA:  Hypertension, headache, dialysis patient EXAM: CT HEAD WITHOUT CONTRAST TECHNIQUE: Contiguous axial images were obtained from the base of the skull through the vertex without intravenous contrast. COMPARISON:  Non FINDINGS: Normal ventricular morphology. No midline shift or mass effect. Normal appearance of brain parenchyma. No intracranial hemorrhage, mass lesion, or evidence acute infarction. No extra-axial fluid collections. Paranasal sinuses and mastoid air cells clear. Osseous structures unremarkable. IMPRESSION: Normal exam. Electronically Signed   By: Lavonia Dana M.D.   On: 12/14/2015 17:37    ____________________________________________   PROCEDURES  Procedure(s) performed: None  Critical Care performed: No ____________________________________________   INITIAL IMPRESSION / ASSESSMENT AND PLAN / ED COURSE  Pertinent labs & imaging results that were available during my care of the patient were reviewed by me and considered in my medical decision making (see chart for details).  50 y.o. male with ESRD on HD presenting with headache and hypertension, as well as 3 days of nonproductive cough and congestion. It is possible that the patient has a URI or flulike illness, which is driving the headache and therefore his hypertension.  I do not see any signs or symptoms of an acute CVA. However, given his markedly elevated blood pressure here today, we'll get a CAT scan for further evaluation of any intracranial abnormality. In the meantime, I will treat both his pain as well as his blood pressure to see if his symptoms improve. We will also get a chest x-ray to rule out pneumonia and a flu swab to evaluate for  influenza.  ----------------------------------------- 6:43 PM on 12/14/2015 -----------------------------------------  The patient's headache has improved, and his blood pressure has come down to the 170s over 110s without any intervention. I will continue to treat him with antihypertensives and pain medication. The patient had complained of a cough, and his chest x-ray reveals a large right-sided pleural effusion that extends more than halfway  up the lung field. He was admitted at [facility] 1/17 with a similar effusion that was tapped, and did not yield any infectious or malignant etiology. He had 1.4 L removed at that time. It is possible that the patient's cough now is symptomatic pleural effusion. I will admit him to the hospital, and cover him with antibiotics for the chance that this may be infectious. He continues to maintain oxygen saturations in the high 90s at rest. ____________________________________________  FINAL CLINICAL IMPRESSION(S) / ED DIAGNOSES  Final diagnoses:  Pleural effusion  Hypertensive urgency  Elevated troponin  URI (upper respiratory infection)      NEW MEDICATIONS STARTED DURING THIS VISIT:  New Prescriptions   No medications on file     Eula Listen, MD 12/14/15 1844

## 2015-12-14 NOTE — H&P (Signed)
[place] [facility] [place] NAME: Jonathon Conley    MR#:  [id number]  DATE OF BIRTH:  June 13, 1966  DATE OF ADMISSION:  12/14/2015  PRIMARY CARE PHYSICIAN: [name], MD   REQUESTING/REFERRING PHYSICIAN: Eula Listen, MD  CHIEF COMPLAINT:   Chief Complaint  Patient presents with  . Hypertension  . Headache   Headache and hypertension during hemodialysis today. HISTORY OF PRESENT ILLNESS:  Jonathon Conley  is a 50 y.o. male with a known history of hypertension, ESRD on hemodialysis and hyperlipidemia. The patient has had nonproductive cough with rhinorrhea and congestion for the past 3 days. He denies any fever or chills, no chest pain, shortness of breath or leg edema. He started to have headache during hemodialysis, and was found to have high blood pressure more than 200. Blood pressure in the ED was 223/129. He was treated with IV hydralazine in the ED, but blood pressure is still at 188/120. He has a history of right pleural effusion and got a thoracentesis in [facility] last month. Chest x-ray show right sided large amount pleural effusion.  PAST MEDICAL HISTORY:   Past Medical History  Diagnosis Date  . Hypertension   . Hyperlipidemia   . Chronic kidney disease   . OSA (obstructive sleep apnea) 03/27/2013  . Blood transfusion without reported diagnosis     PAST SURGICAL HISTORY:   Past Surgical History  Procedure Laterality Date  . Kidney transplant      SOCIAL HISTORY:   Social History  Substance Use Topics  . Smoking status: Never Smoker   . Smokeless tobacco: Never Used  . Alcohol Use: No    FAMILY HISTORY:   Family History  Problem Relation Age of Onset  . Heart disease Mother   . Cancer Maternal Grandmother   . Cancer Maternal Grandfather   . Asthma Son   . Asthma Son     DRUG ALLERGIES:  No Known Allergies  REVIEW OF SYSTEMS:  CONSTITUTIONAL: No fever, has headache and generalized  weakness.  EYES: No blurred or double vision.  EARS, NOSE, AND THROAT: No tinnitus or ear pain.  RESPIRATORY: No cough, shortness of breath, wheezing or hemoptysis.  CARDIOVASCULAR: No chest pain, orthopnea, edema.  GASTROINTESTINAL: No nausea, vomiting, diarrhea or abdominal pain.  GENITOURINARY: No dysuria, hematuria.  ENDOCRINE: No polyuria, nocturia,  HEMATOLOGY: No anemia, easy bruising or bleeding SKIN: No rash or lesion. MUSCULOSKELETAL: No joint pain or arthritis.   NEUROLOGIC: No tingling, numbness, weakness.  PSYCHIATRY: No anxiety or depression.   MEDICATIONS AT HOME:   Prior to Admission medications   Medication Sig Start Date End Date Taking? Authorizing Provider  amLODipine (NORVASC) 10 MG tablet Take 10 mg by mouth daily.   Yes Historical Provider, MD  aspirin EC 81 MG tablet Take 81 mg by mouth at bedtime.   Yes Historical Provider, MD  calcitRIOL (ROCALTROL) 0.5 MCG capsule Take 1 capsule (0.5 mcg total) by mouth Every Tuesday,Thursday,and Saturday with dialysis. 09/24/15  Yes Orson Eva, MD  Calcium Carbonate-Vitamin D (CALCIUM 600+D) 600-400 MG-UNIT tablet Take 1 tablet by mouth every evening.   Yes Historical Provider, MD  carvedilol (COREG) 25 MG tablet Take 25 mg by mouth 2 (two) times daily.    Yes Historical Provider, MD  cinacalcet (SENSIPAR) 30 MG tablet Take 30 mg by mouth daily.   Yes Historical Provider, MD  ferrous sulfate 325 (65 FE) MG tablet Take 325 mg by mouth 3 (three) times daily  with meals.    Yes Historical Provider, MD  hydrALAZINE (APRESOLINE) 50 MG tablet Take 100 mg by mouth 2 (two) times daily.    Yes Historical Provider, MD  lisinopril (PRINIVIL,ZESTRIL) 40 MG tablet Take 40 mg by mouth at bedtime.   Yes Historical Provider, MD  predniSONE (DELTASONE) 5 MG tablet Take 5 mg by mouth daily with breakfast.   Yes Historical Provider, MD  sevelamer carbonate (RENVELA) 800 MG tablet Take 2,400 mg by mouth 3 (three) times daily with meals.    Yes  Historical Provider, MD  simvastatin (ZOCOR) 5 MG tablet Take 1 tablet (5 mg total) by mouth at bedtime. 02/17/15  Yes Shanker Kristeen Mans, MD      VITAL SIGNS:  Blood pressure 184/119, pulse 81, temperature 98.4 F (36.9 C), temperature source Oral, resp. rate 16, height 5' 10"  (1.778 m), weight 77.111 kg (170 lb), SpO2 99 %.  PHYSICAL EXAMINATION:  GENERAL:  50 y.o.-year-old patient lying in the bed with no acute distress.  EYES: Pupils equal, round, reactive to light and accommodation. No scleral icterus. Extraocular muscles intact.  HEENT: Head atraumatic, normocephalic. Oropharynx and nasopharynx clear.  NECK:  Supple, no jugular venous distention. No thyroid enlargement, no tenderness.  LUNGS: Normal breath sounds on left side but diminished breath sounds on the right side, no wheezing, rales,rhonchi or crepitation. No use of accessory muscles of respiration.  CARDIOVASCULAR: S1, S2 normal. No murmurs, rubs, or gallops.  ABDOMEN: Soft, nontender, nondistended. Bowel sounds present. No organomegaly or mass.  EXTREMITIES: No pedal edema, cyanosis, or clubbing.  NEUROLOGIC: Cranial nerves II through XII are intact. Muscle strength 5/5 in all extremities. Sensation intact. Gait not checked.  PSYCHIATRIC: The patient is alert and oriented x 3.  SKIN: No obvious rash, lesion, or ulcer.   LABORATORY PANEL:   CBC  Recent Labs Lab 12/14/15 1559  WBC 6.2  HGB 12.2*  HCT 37.1*  PLT 163   ------------------------------------------------------------------------------------------------------------------  Chemistries   Recent Labs Lab 12/14/15 1559  NA 138  K 4.0  CL 99*  CO2 26  GLUCOSE 92  BUN 24*  CREATININE 7.58*  CALCIUM 8.3*   ------------------------------------------------------------------------------------------------------------------  Cardiac Enzymes  Recent Labs Lab 12/14/15 1559  TROPONINI 0.05*    ------------------------------------------------------------------------------------------------------------------  RADIOLOGY:  Dg Chest 2 View  12/14/2015  CLINICAL DATA:  Hypertension.  Cough.  End-stage renal disease EXAM: CHEST  2 VIEW COMPARISON:  November 03, 2015 FINDINGS: There is a large right pleural effusion with consolidation throughout much of the right middle and lower lobes. The left lung is clear. Heart size is upper normal with pulmonary vascularity within normal limits. Central catheter tip is at the cavoatrial junction. No pneumothorax. No bone lesions. No demonstrable adenopathy. IMPRESSION: Large right pleural effusion with probable compressive atelectasis right middle and lower lobes. Left lung clear. Heart upper normal in size. Electronically Signed   By: Lowella Grip III M.D.   On: 12/14/2015 18:04   Ct Head Wo Contrast  12/14/2015  CLINICAL DATA:  Hypertension, headache, dialysis patient EXAM: CT HEAD WITHOUT CONTRAST TECHNIQUE: Contiguous axial images were obtained from the base of the skull through the vertex without intravenous contrast. COMPARISON:  Non FINDINGS: Normal ventricular morphology. No midline shift or mass effect. Normal appearance of brain parenchyma. No intracranial hemorrhage, mass lesion, or evidence acute infarction. No extra-axial fluid collections. Paranasal sinuses and mastoid air cells clear. Osseous structures unremarkable. IMPRESSION: Normal exam. Electronically Signed   By: Lavonia Dana M.D.   On:  12/14/2015 17:37    EKG:   Orders placed or performed during the hospital encounter of 12/14/15  . ED EKG within 10 minutes  . ED EKG within 10 minutes    IMPRESSION AND PLAN:   Right side large pleural effusion Keep nothing by mouth after midnight, IR guided thoracentesis with labs tomorrow.  Hypertension malignancy. Start IV hydralazine when necessary, continue home hypertension medication, including Norvasc, Coreg, hydralazine and  lisinopril.  Elevated troponin, possible due to renal failure.  ESRD on hemodialysis. Nephrology consult to continue hemodialysis.   All the records are reviewed and case discussed with ED provider. Management plans discussed with the patient, his wife and they are in agreement.  CODE STATUS: Full code  TOTAL TIME TAKING CARE OF THIS PATIENT: 58 minutes.    Demetrios Loll M.D on 12/14/2015 at 9:20 PM  Between 7am to 6pm - Pager - [phone]  After 6pm go to [email or web address] - password [email or web address] [facility]  [facility]  Office  [phone]  CC: Primary care physician; [name], MD

## 2015-12-14 NOTE — ED Notes (Signed)
Pt sent over via ems from dialysis with reports of HTN and headache. Pt did not complete dialysis treatment prior to coming to ER.

## 2015-12-14 NOTE — ED Notes (Signed)
Lab called and blood cultures had to be recollected.   Two sets obtained, one from left hand and one from left arm.    Then the abx were restarted.

## 2015-12-14 NOTE — ED Notes (Addendum)
Pt transported to room 223 via stretcher.

## 2015-12-15 ENCOUNTER — Inpatient Hospital Stay: Payer: Self-pay

## 2015-12-15 DIAGNOSIS — J90 Pleural effusion, not elsewhere classified: Secondary | ICD-10-CM | POA: Diagnosis not present

## 2015-12-15 LAB — BASIC METABOLIC PANEL
Anion gap: 11 (ref 5–15)
BUN: 32 mg/dL — AB (ref 6–20)
CALCIUM: 7.6 mg/dL — AB (ref 8.9–10.3)
CHLORIDE: 101 mmol/L (ref 101–111)
CO2: 25 mmol/L (ref 22–32)
CREATININE: 10.09 mg/dL — AB (ref 0.61–1.24)
GFR, EST AFRICAN AMERICAN: 6 mL/min — AB (ref >60)
GFR, EST NON AFRICAN AMERICAN: 5 mL/min — AB (ref >60)
Glucose, Bld: 86 mg/dL (ref 65–99)
Potassium: 4.6 mmol/L (ref 3.5–5.1)
SODIUM: 137 mmol/L (ref 135–145)

## 2015-12-15 LAB — GLUCOSE, SEROUS FLUID: GLUCOSE FL: 88 mg/dL

## 2015-12-15 LAB — CBC
HCT: 30.6 % — ABNORMAL LOW (ref 40.0–52.0)
Hemoglobin: 10 g/dL — ABNORMAL LOW (ref 13.0–18.0)
MCH: 27 pg (ref 26.0–34.0)
MCHC: 32.7 g/dL (ref 32.0–36.0)
MCV: 82.5 fL (ref 80.0–100.0)
PLATELETS: 126 10*3/uL — AB (ref 150–440)
RBC: 3.71 MIL/uL — AB (ref 4.40–5.90)
RDW: 19.6 % — AB (ref 11.5–14.5)
WBC: 3.7 10*3/uL — AB (ref 3.8–10.6)

## 2015-12-15 LAB — MRSA PCR SCREENING: MRSA BY PCR: NEGATIVE

## 2015-12-15 LAB — BODY FLUID CELL COUNT WITH DIFFERENTIAL
Eos, Fluid: 11 %
LYMPHS FL: 39 %
Monocyte-Macrophage-Serous Fluid: 33 %
Neutrophil Count, Fluid: 17 %
OTHER CELLS FL: 0 %
Total Nucleated Cell Count, Fluid: 148 cu mm

## 2015-12-15 LAB — PROTEIN, BODY FLUID: Total protein, fluid: 3.9 g/dL

## 2015-12-15 LAB — LACTATE DEHYDROGENASE, PLEURAL OR PERITONEAL FLUID: LD FL: 158 U/L — AB (ref 3–23)

## 2015-12-15 LAB — AMYLASE, BODY FLUID: AMYLASE FL: 65 U/L

## 2015-12-15 MED ORDER — NITROGLYCERIN 0.4 MG SL SUBL: 0.4000 mg | SUBLINGUAL_TABLET | SUBLINGUAL | Status: DC | PRN

## 2015-12-15 MED ORDER — OXYCODONE-ACETAMINOPHEN 5-325 MG PO TABS
1.0000 | ORAL_TABLET | Freq: Four times a day (QID) | ORAL | Status: DC | PRN
  Administered 2015-12-15 (×2): 1 via ORAL
  Filled 2015-12-15 (×2): qty 1

## 2015-12-15 MED ORDER — NITROGLYCERIN 2 % TD OINT
1.0000 [in_us] | TOPICAL_OINTMENT | Freq: Four times a day (QID) | TRANSDERMAL | Status: DC
  Administered 2015-12-15: 1 [in_us] via TOPICAL
  Filled 2015-12-15: qty 1

## 2015-12-15 MED ORDER — KETOROLAC TROMETHAMINE 15 MG/ML IJ SOLN
15.0000 mg | Freq: Once | INTRAMUSCULAR | Status: AC
  Administered 2015-12-15: 15 mg via INTRAVENOUS
  Filled 2015-12-15: qty 1

## 2015-12-15 NOTE — Progress Notes (Signed)
[facility]                                                                                                                                                                                            Patient Demographics   Jonathon Conley, is a 50 y.o. male, DOB - May 04, 1966, XKG:[id number]  Admit date - 12/14/2015   Admitting Physician Demetrios Loll, MD  Outpatient Primary MD for the patient is [name], MD   LOS - 1  Subjective: Patient admitted with headache and accelerated high blood pressure sent from dialysis. He also was noted to have large pleural effusion.     Review of Systems:   CONSTITUTIONAL: No documented fever. No fatigue, weakness. No weight gain, no weight loss.  EYES: No blurry or double vision.  ENT: No tinnitus. No postnasal drip. No redness of the oropharynx.  RESPIRATORY: No cough, no wheeze, no hemoptysis. + dyspnea.  CARDIOVASCULAR: No chest pain. No orthopnea. No palpitations. No syncope.  GASTROINTESTINAL: No nausea, no vomiting or diarrhea. No abdominal pain. No melena or hematochezia.  GENITOURINARY: No dysuria or hematuria.  ENDOCRINE: No polyuria or nocturia. No heat or cold intolerance.  HEMATOLOGY: No anemia. No bruising. No bleeding.  INTEGUMENTARY: No rashes. No lesions.  MUSCULOSKELETAL: No arthritis. No swelling. No gout.  NEUROLOGIC: No numbness, tingling, or ataxia. No seizure-type activity.  PSYCHIATRIC: No anxiety. No insomnia. No ADD.    Vitals:   Filed Vitals:   12/15/15 0830 12/15/15 0907 12/15/15 0935 12/15/15 1113  BP: 164/95 173/105 183/105 152/92  Pulse: 88 79 84 88  Temp: 98.2 F (36.8 C)   99 F (37.2 C)  TempSrc: Oral   Oral  Resp: 18 19 18 18   Height:      Weight:      SpO2: 100% 100% 97% 100%    Wt Readings from Last 3 Encounters:  12/14/15 77.111 kg (170 lb)  12/06/15 77.111 kg (170 lb)  11/04/15 70.7 kg (155 lb 13.8 oz)     Intake/Output Summary (Last 24  hours) at 12/15/15 1637 Last data filed at 12/15/15 1300  Gross per 24 hour  Intake    240 ml  Output      0 ml  Net    240 ml    Physical Exam:   GENERAL: Pleasant-appearing in no apparent distress.  HEAD, EYES, EARS, NOSE AND THROAT: Atraumatic, normocephalic. Extraocular muscles are intact. Pupils equal and reactive to light. Sclerae anicteric. No conjunctival injection. No oro-pharyngeal erythema.  NECK: Supple. There is no jugular venous distention. No bruits, no lymphadenopathy, no thyromegaly.  HEART: Regular rate and rhythm,. No murmurs, no rubs, no clicks.  LUNGS: Decreased breath sounds, no whezing ABDOMEN: Soft, flat, nontender, nondistended. Has good bowel sounds. No hepatosplenomegaly appreciated.  EXTREMITIES: No evidence of any cyanosis, clubbing, or peripheral edema.  +2 pedal and radial pulses bilaterally.  NEUROLOGIC: The patient is alert, awake, and oriented x3 with no focal motor or sensory deficits appreciated bilaterally.  SKIN: Moist and warm with no rashes appreciated.  Psych: Not anxious, depressed LN: No inguinal LN enlargement    Antibiotics   Anti-infectives    Start     Dose/Rate Route Frequency Ordered Stop   12/14/15 1845  levofloxacin (LEVAQUIN) IVPB 750 mg     750 mg 100 mL/hr over 90 Minutes Intravenous  Once 12/14/15 1842 12/14/15 2122      Medications   Scheduled Meds: . amLODipine  10 mg Oral Daily  . [START ON 12/16/2015] calcitRIOL  0.5 mcg Oral Q T,Th,Sa-HD  . carvedilol  25 mg Oral BID WC  . cinacalcet  30 mg Oral Q breakfast  . ferrous sulfate  325 mg Oral TID WC  . heparin  5,000 Units Subcutaneous 3 times per day  . hydrALAZINE  100 mg Oral BID  . lisinopril  40 mg Oral QHS  . predniSONE  5 mg Oral Q breakfast  . sevelamer carbonate  2,400 mg Oral TID WC  . simvastatin  5 mg Oral QHS  . sodium chloride flush  3 mL Intravenous Q12H   Continuous Infusions:  PRN Meds:.sodium chloride, acetaminophen **OR** acetaminophen,  albuterol, hydrALAZINE, ondansetron **OR** ondansetron (ZOFRAN) IV, oxyCODONE-acetaminophen, sodium chloride flush   Data Review:   Micro Results Recent Results (from the past 240 hour(s))  Rapid Influenza A&B Antigens ([facility] only)     Status: Abnormal   Collection Time: 12/14/15  6:59 PM  Result Value Ref Range Status   Influenza A ([facility]) NOT DETECTED (A) NEGATIVE Final   Influenza B ([facility]) NOT DETECTED (A) NEGATIVE Final  Blood culture (routine x 2)     Status: None (Preliminary result)   Collection Time: 12/14/15  7:51 PM  Result Value Ref Range Status   Specimen Description BLOOD LEFT ARM  Final   Special Requests BOTTLES DRAWN AEROBIC AND ANAEROBIC  4CC  Final   Culture NO GROWTH < 24 HOURS  Final   Report Status PENDING  Incomplete  Blood culture (routine x 2)     Status: None (Preliminary result)   Collection Time: 12/14/15  7:51 PM  Result Value Ref Range Status   Specimen Description BLOOD LEFT HAND  Final   Special Requests BOTTLES DRAWN AEROBIC AND ANAEROBIC  2CC  Final   Culture NO GROWTH < 24 HOURS  Final   Report Status PENDING  Incomplete  MRSA PCR Screening     Status: None   Collection Time: 12/15/15  5:50 AM  Result Value Ref Range Status   MRSA by PCR NEGATIVE NEGATIVE Final    Comment:        The GeneXpert MRSA Assay (FDA approved for NASAL specimens only), is one component of a comprehensive MRSA colonization surveillance program. It is not intended to diagnose MRSA infection nor to guide or monitor treatment for MRSA infections.     Radiology Reports X-ray Chest Pa Or Ap  12/15/2015  CLINICAL DATA:  Status post right thoracentesis EXAM: CHEST 1 VIEW COMPARISON:  12/14/2015 FINDINGS: Cardiac shadow is within normal limits. A dialysis catheter is again seen and stable. There is been  significant reduction in right-sided pleural effusion. A small likely loculated component is noted along the lateral aspect of the chest wall. No pneumothorax is seen. The  left lung remains clear. IMPRESSION: Significant reduction in right-sided pleural effusion following thoracentesis. No pneumothorax is noted. Electronically Signed   By: Inez Catalina M.D.   On: 12/15/2015 09:54   Dg Chest 2 View  12/14/2015  CLINICAL DATA:  Hypertension.  Cough.  End-stage renal disease EXAM: CHEST  2 VIEW COMPARISON:  November 03, 2015 FINDINGS: There is a large right pleural effusion with consolidation throughout much of the right middle and lower lobes. The left lung is clear. Heart size is upper normal with pulmonary vascularity within normal limits. Central catheter tip is at the cavoatrial junction. No pneumothorax. No bone lesions. No demonstrable adenopathy. IMPRESSION: Large right pleural effusion with probable compressive atelectasis right middle and lower lobes. Left lung clear. Heart upper normal in size. Electronically Signed   By: Lowella Grip III M.D.   On: 12/14/2015 18:04   Ct Head Wo Contrast  12/14/2015  CLINICAL DATA:  Hypertension, headache, dialysis patient EXAM: CT HEAD WITHOUT CONTRAST TECHNIQUE: Contiguous axial images were obtained from the base of the skull through the vertex without intravenous contrast. COMPARISON:  Non FINDINGS: Normal ventricular morphology. No midline shift or mass effect. Normal appearance of brain parenchyma. No intracranial hemorrhage, mass lesion, or evidence acute infarction. No extra-axial fluid collections. Paranasal sinuses and mastoid air cells clear. Osseous structures unremarkable. IMPRESSION: Normal exam. Electronically Signed   By: Lavonia Dana M.D.   On: 12/14/2015 17:37   US Thoracentesis Asp Pleural Space W/img Guide  12/15/2015  INDICATION: Right-sided pleural effusion EXAM: ULTRASOUND GUIDED THORACENTESIS MEDICATIONS: None. ANESTHESIA/SEDATION: None COMPLICATIONS: None immediate. PROCEDURE: An ultrasound guided thoracentesis was thoroughly discussed with the patient and questions answered. The benefits, risks, alternatives  and complications were also discussed. The patient understands and wishes to proceed with the procedure. Written consent was obtained. Ultrasound was performed to localize and mark an adequate pocket of fluid in the right chest. The area was then prepped and draped in the normal sterile fashion. 1% Lidocaine was used for local anesthesia. Under ultrasound guidance a 8 French thoracentesis catheter was introduced. Thoracentesis was performed. The catheter was removed and a dressing applied. FINDINGS: A total of approximately 2 L of clear yellow fluid was removed. A fluid sample was sent for laboratory analysis. IMPRESSION: Successful ultrasound guided right thoracentesis yielding 2 L of pleural fluid. Electronically Signed   By: Inez Catalina M.D.   On: 12/15/2015 10:32     CBC  Recent Labs Lab 12/14/15 1559 12/15/15 0438  WBC 6.2 3.7*  HGB 12.2* 10.0*  HCT 37.1* 30.6*  PLT 163 126*  MCV 81.0 82.5  MCH 26.6 27.0  MCHC 32.8 32.7  RDW 19.3* 19.6*    Chemistries   Recent Labs Lab 12/14/15 1559 12/15/15 0438  NA 138 137  K 4.0 4.6  CL 99* 101  CO2 26 25  GLUCOSE 92 86  BUN 24* 32*  CREATININE 7.58* 10.09*  CALCIUM 8.3* 7.6*   ------------------------------------------------------------------------------------------------------------------ estimated creatinine clearance is 9.1 mL/min (by C-G formula based on Cr of 10.09). ------------------------------------------------------------------------------------------------------------------ No results for input(s): HGBA1C in the last 72 hours. ------------------------------------------------------------------------------------------------------------------ No results for input(s): CHOL, HDL, LDLCALC, TRIG, CHOLHDL, LDLDIRECT in the last 72 hours. ------------------------------------------------------------------------------------------------------------------ No results for input(s): TSH, T4TOTAL, T3FREE, THYROIDAB in the last 72  hours.  Invalid input(s): FREET3 ------------------------------------------------------------------------------------------------------------------ No results for input(s): VITAMINB12, FOLATE,  FERRITIN, TIBC, IRON, RETICCTPCT in the last 72 hours.  Coagulation profile No results for input(s): INR, PROTIME in the last 168 hours.  No results for input(s): DDIMER in the last 72 hours.  Cardiac Enzymes  Recent Labs Lab 12/14/15 1559  TROPONINI 0.05*   ------------------------------------------------------------------------------------------------------------------ Invalid input(s): POCBNP    Assessment & Plan  Pt is 50 y.o history of hypertension, end-stage renal disease on hemodialysis and hyperlipidemia history of previous pleural effusion that has been drained in the past presented with headache and noted to have accelerated hypertension worsening right-sided pleural effusion..   1. Right side large pleural effusion History of same in the past status post drainage. Follow results due to recurrent nature will ask Dr. Faith Rogue to see  2. Hypertension malignancy. Continue hydralazine , lisinopril, Coreg, Norvasc and when necessary hydralazine blood pressure is improved  3. Congestion and headache was really related to sinus allergies will add Flonase  4. Elevated troponin due to demand ischemia no chest pain will monitor for any cardiac symptoms  5. ESRD on hemodialysis. Nephrology consult pre-shaded     Code Status Orders        Start     Ordered   12/14/15 2224  Full code   Continuous     12/14/15 2223    Code Status History    Date Active Date Inactive Code Status Order ID Comments User Context   11/03/2015  2:11 AM 11/04/2015  5:39 PM Full Code [id number]  Lily Kocher, MD Inpatient   09/21/2015  7:30 PM 09/24/2015  4:13 PM Full Code [id number]  Vianne Bulls, MD ED   02/14/2015 11:41 PM 02/17/2015  6:14 PM Full Code [id number]  Lavina Hamman, MD ED            Consults  nephrology  DVT Prophylaxis  heparin  Lab Results  Component Value Date   PLT 126* 12/15/2015     Time Spent in minutes   35 minutes Greater than 50% of time spent in care coordination and counseling patient regarding the condition and plan of care. Discussed with the patient regarding his condition and plan a care   Dustin Flock M.D on 12/15/2015 at 4:37 PM  Between 7am to 6pm - Pager - [phone]  After 6pm go to [email or web address] - password [email or web address] [facility]   Office  [phone]

## 2015-12-15 NOTE — Progress Notes (Signed)
Per Dr. Posey Pronto okay to place order for percocet 5/325 1-2 tab q 6hours for pain unrelieved by tylenol

## 2015-12-15 NOTE — Clinical Social Work Note (Signed)
CSW had consult for medication assist. RN CM will assist with this for patient. Consult discontinued for CSW. Please reconsult CSW if needed. Shela Leff MSW,LCSW [phone]

## 2015-12-15 NOTE — Procedures (Signed)
Right US thoracentesis for 2 liters without diff.  Complications:  None  Blood Loss: none  See dictation in canopy pacs

## 2015-12-15 NOTE — Care Management Note (Signed)
Case Management Note  Patient Details  Name: Jonathon Conley MRN: [id number] Date of Birth: April 06, 1966  Subjective/Objective:            Patient admitted for pleural effusion. Patient resides in [facility], and receives states that his dialysis center is in [place].   Patient states that he was previously employed by his employer and become uninsured December 1st 2016.  Patient states "im not sure who is paying for my dialysis, they have just been sending the bills to me".  Patient states that he has been seen at the [facility] by [facility].   I will provide patient with information to [facility] in [place]. Patient states that he has applied for SS, Medicaid, and disability and it is in process.  I have contacted [facility] liaison to further assess which facility patient is from and if any medical coverage is in place   Action/Plan: Following for discharge planning, and medication needs  Expected Discharge Date:                  Expected Discharge Plan:     In-House Referral:     Discharge planning Services     Post Acute Care Choice:    Choice offered to:     DME Arranged:    DME Agency:     HH Arranged:    [place] Agency:     Status of Service:     Medicare Important Message Given:    Date Medicare IM Given:    Medicare IM give by:    Date Additional Medicare IM Given:    Additional Medicare Important Message give by:     If discussed at [place] of Stay Meetings, dates discussed:    Additional Comments:  Beverly Sessions, RN 12/15/2015, 11:36 AM

## 2015-12-15 NOTE — Consult Note (Signed)
[facility] CONSULT NOTE    Date: 12/15/2015                  Patient Name:  Jonathon Conley  MRN: [id number]  DOB: 01-05-1966  Age / Sex: 50 y.o., male         PCP: [name], MD                 Service Requesting Consult: Dr. Bridgett Larsson                 Reason for Consult: Management of ESRD             History of Present Illness: Patient is a 50 y.o. male with a PMHx of end-stage renal disease on hemodialysis Tuesday, Thursday, Saturday followed at [facility] by [facility] nephrology, hypertension, hyperlipidemia, obstructive sleep apnea, anemia of chronic kidney disease, secondary hyperparathyroidism, history of kidney transplant that lasted 15 years and failed in October 2016, who was admitted to [facility] on 12/14/2015 for evaluation of elevated blood pressure during dialysis as well as headache.  Patient reports that he went to his regularly scheduled hemodialysis yesterday. His presenting systolic blood pressure was greater than 200. When he presented to the emergency department blood pressure was 223/129. He was administered hydralazine intravenously in the emergency department. He was also found to have a recurrent right pleural effusion. He recently had thoracentesis for this at [facility]. The patient had thoracentesis performed this a.m.   Medications: Outpatient medications: Prescriptions prior to admission  Medication Sig Dispense Refill Last Dose  . amLODipine (NORVASC) 10 MG tablet Take 10 mg by mouth daily.   Past Month at Unknown time  . aspirin EC 81 MG tablet Take 81 mg by mouth at bedtime.   12/13/2015 at 2300  . calcitRIOL (ROCALTROL) 0.5 MCG capsule Take 1 capsule (0.5 mcg total) by mouth Every Tuesday,Thursday,and Saturday with dialysis. 15 capsule 0 12/14/2015 at Unknown time  . Calcium Carbonate-Vitamin D (CALCIUM 600+D) 600-400 MG-UNIT tablet Take 1 tablet by mouth every evening.   12/13/2015 at Unknown time  . carvedilol (COREG) 25 MG tablet  Take 25 mg by mouth 2 (two) times daily.   0 12/14/2015 at 0900  . cinacalcet (SENSIPAR) 30 MG tablet Take 30 mg by mouth daily.   12/14/2015 at Unknown time  . ferrous sulfate 325 (65 FE) MG tablet Take 325 mg by mouth 3 (three) times daily with meals.    12/14/2015 at Unknown time  . hydrALAZINE (APRESOLINE) 50 MG tablet Take 100 mg by mouth 2 (two) times daily.    12/14/2015 at Unknown time  . lisinopril (PRINIVIL,ZESTRIL) 40 MG tablet Take 40 mg by mouth at bedtime.   12/13/2015 at Unknown time  . predniSONE (DELTASONE) 5 MG tablet Take 5 mg by mouth daily with breakfast.   12/14/2015 at Unknown time  . sevelamer carbonate (RENVELA) 800 MG tablet Take 2,400 mg by mouth 3 (three) times daily with meals.    12/14/2015 at Unknown time  . simvastatin (ZOCOR) 5 MG tablet Take 1 tablet (5 mg total) by mouth at bedtime. 30 tablet 0 12/13/2015 at Unknown time    Current medications: Current Facility-Administered Medications  Medication Dose Route Frequency Provider Last Rate Last Dose  . 0.9 %  sodium chloride infusion  250 mL Intravenous PRN Demetrios Loll, MD      . acetaminophen (TYLENOL) tablet 650 mg  650 mg Oral Q6H PRN Demetrios Loll, MD   [phone]  mg at 12/14/15 2248   Or  . acetaminophen (TYLENOL) suppository 650 mg  650 mg Rectal Q6H PRN Demetrios Loll, MD      . albuterol (PROVENTIL) (2.5 MG/3ML) 0.083% nebulizer solution 2.5 mg  2.5 mg Nebulization Q2H PRN Demetrios Loll, MD      . amLODipine (NORVASC) tablet 10 mg  10 mg Oral Daily Demetrios Loll, MD   10 mg at 12/15/15 1122  . [START ON 12/16/2015] calcitRIOL (ROCALTROL) capsule 0.5 mcg  0.5 mcg Oral Q T,Th,Sa-HD Demetrios Loll, MD      . carvedilol (COREG) tablet 25 mg  25 mg Oral BID WC Demetrios Loll, MD   25 mg at 12/15/15 0850  . cinacalcet (SENSIPAR) tablet 30 mg  30 mg Oral Q breakfast Demetrios Loll, MD   30 mg at 12/15/15 0851  . ferrous sulfate tablet 325 mg  325 mg Oral TID WC Demetrios Loll, MD   325 mg at 12/15/15 0853  . heparin injection 5,000 Units  5,000 Units Subcutaneous 3  times per day Demetrios Loll, MD   5,000 Units at 12/15/15 0700  . hydrALAZINE (APRESOLINE) injection 10 mg  10 mg Intravenous Q6H PRN Demetrios Loll, MD   10 mg at 12/14/15 2014  . hydrALAZINE (APRESOLINE) tablet 100 mg  100 mg Oral BID Demetrios Loll, MD   100 mg at 12/15/15 1122  . lisinopril (PRINIVIL,ZESTRIL) tablet 40 mg  40 mg Oral QHS Demetrios Loll, MD   40 mg at 12/14/15 2248  . ondansetron (ZOFRAN) tablet 4 mg  4 mg Oral Q6H PRN Demetrios Loll, MD       Or  . ondansetron [facility]) injection 4 mg  4 mg Intravenous Q6H PRN Demetrios Loll, MD      . predniSONE (DELTASONE) tablet 5 mg  5 mg Oral Q breakfast Demetrios Loll, MD   5 mg at 12/15/15 0851  . sevelamer carbonate (RENVELA) tablet 2,400 mg  2,400 mg Oral TID WC Demetrios Loll, MD   2,400 mg at 12/15/15 0850  . simvastatin (ZOCOR) tablet 5 mg  5 mg Oral QHS Demetrios Loll, MD   5 mg at 12/14/15 2355  . sodium chloride flush (NS) 0.9 % injection 3 mL  3 mL Intravenous Q12H Demetrios Loll, MD   3 mL at 12/15/15 1124  . sodium chloride flush (NS) 0.9 % injection 3 mL  3 mL Intravenous PRN Demetrios Loll, MD          Allergies: No Known Allergies    Past Medical History: Past Medical History  Diagnosis Date  . Hypertension   . Hyperlipidemia   . Chronic kidney disease   . OSA (obstructive sleep apnea) 03/27/2013  . Blood transfusion without reported diagnosis    end-stage renal disease on hemodialysis Tuesday, Thursday, Saturday followed by [facility] nephrology at [facility] dialysis center. Anemia chronic kidney disease Secondary hyperparathyroidism  Past Surgical History: Past Surgical History  Procedure Laterality Date  . Kidney transplant       Family History: Family History  Problem Relation Age of Onset  . Heart disease Mother   . Cancer Maternal Grandmother   . Cancer Maternal Grandfather   . Asthma Son   . Asthma Son      Social History: Social History   Social History  . Marital Status: Single    Spouse Name: N/A  . Number of Children: N/A  . Years of  Education: N/A   Occupational History  . Not on file.   Social  History Main Topics  . Smoking status: Never Smoker   . Smokeless tobacco: Never Used  . Alcohol Use: No  . Drug Use: No  . Sexual Activity: Not on file   Other Topics Concern  . Not on file   Social History Narrative     Review of Systems: Review of Systems  Constitutional: Negative for fever, chills and weight loss.  HENT: Positive for congestion. Negative for hearing loss and nosebleeds.   Eyes: Negative for blurred vision and double vision.  Respiratory: Positive for cough and sputum production. Negative for hemoptysis.   Cardiovascular: Negative for chest pain, palpitations and orthopnea.  Gastrointestinal: Negative for heartburn, nausea and abdominal pain.  Genitourinary: Negative for dysuria and urgency.  Musculoskeletal: Negative for myalgias.  Skin: Negative for itching and rash.  Neurological: Positive for headaches. Negative for dizziness and focal weakness.  Endo/Heme/Allergies: Does not bruise/bleed easily.  Psychiatric/Behavioral: Negative for depression. The patient is not nervous/anxious.      Vital Signs: Blood pressure 152/92, pulse 88, temperature 99 F (37.2 C), temperature source Oral, resp. rate 18, height 5' 10"  (1.778 m), weight 77.111 kg (170 lb), SpO2 100 %.  Weight trends: Filed Weights   12/14/15 1557  Weight: 77.111 kg (170 lb)    Physical Exam: General: NAD, sitting up in bed  Head: Normocephalic, atraumatic.  Eyes: Anicteric, EOMI  Nose: Mucous membranes moist, not inflammed, nonerythematous.  Throat: Oropharynx nonerythematous, no exudate appreciated.   Neck: Supple, trachea midline.  Lungs:  Basilar rales, normal effort  Heart: RRR. S1 and S2 normal without gallop, murmur, or rubs.  Abdomen:  BS normoactive. Soft, Nondistended, non-tender.  No masses or organomegaly.  Extremities: No pretibial edema.  Neurologic: A&O X3, Motor strength is 5/5 in the all 4  extremities  Skin: No visible rashes, scars.  Access:   R IJ permcath  Lab results: Basic Metabolic Panel:  Recent Labs Lab 12/14/15 1559 12/15/15 0438  NA 138 137  K 4.0 4.6  CL 99* 101  CO2 26 25  GLUCOSE 92 86  BUN 24* 32*  CREATININE 7.58* 10.09*  CALCIUM 8.3* 7.6*    Liver Function Tests: No results for input(s): AST, ALT, ALKPHOS, BILITOT, PROT, ALBUMIN in the last 168 hours. No results for input(s): LIPASE, AMYLASE in the last 168 hours. No results for input(s): AMMONIA in the last 168 hours.  CBC:  Recent Labs Lab 12/14/15 1559 12/15/15 0438  WBC 6.2 3.7*  HGB 12.2* 10.0*  HCT 37.1* 30.6*  MCV 81.0 82.5  PLT 163 126*    Cardiac Enzymes:  Recent Labs Lab 12/14/15 1559  TROPONINI 0.05*    BNP: Invalid input(s): POCBNP  CBG: No results for input(s): GLUCAP in the last 168 hours.  Microbiology: Results for orders placed or performed during the hospital encounter of 12/14/15  Rapid Influenza A&B Antigens [facility] only)     Status: Abnormal   Collection Time: 12/14/15  6:59 PM  Result Value Ref Range Status   Influenza A ([facility]) NOT DETECTED (A) NEGATIVE Final   Influenza B ([facility]) NOT DETECTED (A) NEGATIVE Final  Blood culture (routine x 2)     Status: None (Preliminary result)   Collection Time: 12/14/15  7:51 PM  Result Value Ref Range Status   Specimen Description BLOOD LEFT ARM  Final   Special Requests BOTTLES DRAWN AEROBIC AND ANAEROBIC  4CC  Final   Culture NO GROWTH < 24 HOURS  Final   Report Status PENDING  Incomplete  Blood  culture (routine x 2)     Status: None (Preliminary result)   Collection Time: 12/14/15  7:51 PM  Result Value Ref Range Status   Specimen Description BLOOD LEFT HAND  Final   Special Requests BOTTLES DRAWN AEROBIC AND ANAEROBIC  2CC  Final   Culture NO GROWTH < 24 HOURS  Final   Report Status PENDING  Incomplete  MRSA PCR Screening     Status: None   Collection Time: 12/15/15  5:50 AM  Result Value Ref Range  Status   MRSA by PCR NEGATIVE NEGATIVE Final    Comment:        The GeneXpert MRSA Assay (FDA approved for NASAL specimens only), is one component of a comprehensive MRSA colonization surveillance program. It is not intended to diagnose MRSA infection nor to guide or monitor treatment for MRSA infections.     Coagulation Studies: No results for input(s): LABPROT, INR in the last 72 hours.  Urinalysis: No results for input(s): COLORURINE, LABSPEC, PHURINE, GLUCOSEU, HGBUR, BILIRUBINUR, KETONESUR, PROTEINUR, UROBILINOGEN, NITRITE, LEUKOCYTESUR in the last 72 hours.  Invalid input(s): APPERANCEUR    Imaging: X-ray Chest Pa Or Ap  12/15/2015  CLINICAL DATA:  Status post right thoracentesis EXAM: CHEST 1 VIEW COMPARISON:  12/14/2015 FINDINGS: Cardiac shadow is within normal limits. A dialysis catheter is again seen and stable. There is been significant reduction in right-sided pleural effusion. A small likely loculated component is noted along the lateral aspect of the chest wall. No pneumothorax is seen. The left lung remains clear. IMPRESSION: Significant reduction in right-sided pleural effusion following thoracentesis. No pneumothorax is noted. Electronically Signed   By: Inez Catalina M.D.   On: 12/15/2015 09:54   Dg Chest 2 View  12/14/2015  CLINICAL DATA:  Hypertension.  Cough.  End-stage renal disease EXAM: CHEST  2 VIEW COMPARISON:  November 03, 2015 FINDINGS: There is a large right pleural effusion with consolidation throughout much of the right middle and lower lobes. The left lung is clear. Heart size is upper normal with pulmonary vascularity within normal limits. Central catheter tip is at the cavoatrial junction. No pneumothorax. No bone lesions. No demonstrable adenopathy. IMPRESSION: Large right pleural effusion with probable compressive atelectasis right middle and lower lobes. Left lung clear. Heart upper normal in size. Electronically Signed   By: Lowella Grip III M.D.    On: 12/14/2015 18:04   Ct Head Wo Contrast  12/14/2015  CLINICAL DATA:  Hypertension, headache, dialysis patient EXAM: CT HEAD WITHOUT CONTRAST TECHNIQUE: Contiguous axial images were obtained from the base of the skull through the vertex without intravenous contrast. COMPARISON:  Non FINDINGS: Normal ventricular morphology. No midline shift or mass effect. Normal appearance of brain parenchyma. No intracranial hemorrhage, mass lesion, or evidence acute infarction. No extra-axial fluid collections. Paranasal sinuses and mastoid air cells clear. Osseous structures unremarkable. IMPRESSION: Normal exam. Electronically Signed   By: Lavonia Dana M.D.   On: 12/14/2015 17:37   US Thoracentesis Asp Pleural Space W/img Guide  12/15/2015  INDICATION: Right-sided pleural effusion EXAM: ULTRASOUND GUIDED THORACENTESIS MEDICATIONS: None. ANESTHESIA/SEDATION: None COMPLICATIONS: None immediate. PROCEDURE: An ultrasound guided thoracentesis was thoroughly discussed with the patient and questions answered. The benefits, risks, alternatives and complications were also discussed. The patient understands and wishes to proceed with the procedure. Written consent was obtained. Ultrasound was performed to localize and mark an adequate pocket of fluid in the right chest. The area was then prepped and draped in the normal sterile fashion. 1% Lidocaine was  used for local anesthesia. Under ultrasound guidance a 8 French thoracentesis catheter was introduced. Thoracentesis was performed. The catheter was removed and a dressing applied. FINDINGS: A total of approximately 2 L of clear yellow fluid was removed. A fluid sample was sent for laboratory analysis. IMPRESSION: Successful ultrasound guided right thoracentesis yielding 2 L of pleural fluid. Electronically Signed   By: Inez Catalina M.D.   On: 12/15/2015 10:32      Assessment & Plan: Pt is a 50 y.o. male with a PMHx of end-stage renal disease on hemodialysis Tuesday, Thursday,  Saturday followed at [facility] by [facility] nephrology, hypertension, hyperlipidemia, obstructive sleep apnea, anemia of chronic kidney disease, secondary hyperparathyroidism, history of kidney transplant that lasted 15 years and failed in October 2016, who was admitted to [facility] on 12/14/2015 for evaluation of elevated blood pressure during dialysis as well as headache.   1. End-stage renal disease on hemodialysis Tuesday, Thursday, Saturday followed by [facility] nephrology at [facility].  Patient had hemodialysis treatment yesterday. No urgent indication for hemodialysis at the moment. If he still here tomorrow we will plan for dialysis.  2. Hypertension. Blood pressure was significantly elevated upon admission. Blood pressure currently down to 152/92.  Continue amlodipine, Coreg, hydralazine, and lisinopril.  3. Anemia of chronic kidney disease. Hemoglobin was 12 upon admission but now down to 10. Hold off on Epogen for now.  4. Secondary hyperparathyroidism. Check intact PTH and phosphorus with dialysis tomorrow if still here.  Continue calcitriol, Sensipar, and Renvela at the current doses for now.  5. Right pleural effusion. Patient status post thoracentesis with removal of 2 L of pleural fluid. Patient apparently has recurrent pleural effusion and had thoracentesis recently at [facility]. If this continues to recur consider referral to Dr. Faith Rogue.  6. Thanks for consultation.

## 2015-12-16 LAB — PH, BODY FLUID: PH, BODY FLUID: 7.9

## 2015-12-16 MED ORDER — LEVOFLOXACIN 750 MG PO TABS: 750.0000 mg | ORAL_TABLET | ORAL | Status: AC

## 2015-12-16 NOTE — Progress Notes (Signed)
Left message to pick up levaquin at [place] [facility]

## 2015-12-16 NOTE — Discharge Summary (Signed)
[facility] NAME: Jonathon Conley    MR#:  [id number]  DATE OF BIRTH:  [birth year]  DATE OF ADMISSION:  12/14/2015 ADMITTING PHYSICIAN: Demetrios Loll, MD  DATE OF DISCHARGE: 12/16/2015 PRIMARY CARE PHYSICIAN: [name], MD    ADMISSION DIAGNOSIS:  Pleural effusion [J90] URI (upper respiratory infection) [J06.9] Elevated troponin [R79.89] Hypertensive urgency [I16.0]  DISCHARGE DIAGNOSIS:  Principal Problem:   Pleural effusion Active Problems:   Malignant hypertension   SECONDARY DIAGNOSIS:   Past Medical History  Diagnosis Date  . Hypertension   . Hyperlipidemia   . Chronic kidney disease   . OSA (obstructive sleep apnea) 03/27/2013  . Blood transfusion without reported diagnosis     HOSPITAL COURSE:   50 year old male with a history of end-stage renal disease on hemodialysis, essential hypertension and anemia chronic kidney disease who presented with headache and elevated blood pressure and found to have a large right pleural effusion.   1. Right sided large pleural effusion: Patient is status post 2 L removal after thoracentesis. LDH was elevated. Serum LDH and protein were not ordered. He was empirically started on Levaquin. He will follow up with Dr. Faith Rogue tomorrow for further evaluation as this is recurrent right-sided pleural effusion.  2. ESRD on hemodialysis: Patient will resume his Tuesday, Thursday and Saturday schedule.  3 accelerated hypertension: Patient's blood pressure is better controlled. He will resume hydralazine, lisinopril, Cora, Norvasc. He will need close monitoring as an outpatient for his blood pressure.  4. Elevated troponin: Due to demand ischemia and not ACS.  DISCHARGE CONDITIONS AND DIET:    Patient is stable for discharge on a renal diet  CONSULTS OBTAINED:  Treatment Team:  Dagoberto Ligas, MD  DRUG ALLERGIES:  No Known Allergies  DISCHARGE MEDICATIONS:   Current  Discharge Medication List    CONTINUE these medications which have NOT CHANGED   Details  amLODipine (NORVASC) 10 MG tablet Take 10 mg by mouth daily.    aspirin EC 81 MG tablet Take 81 mg by mouth at bedtime.    calcitRIOL (ROCALTROL) 0.5 MCG capsule Take 1 capsule (0.5 mcg total) by mouth Every Tuesday,Thursday,and Saturday with dialysis. Qty: 15 capsule, Refills: 0    Calcium Carbonate-Vitamin D (CALCIUM 600+D) 600-400 MG-UNIT tablet Take 1 tablet by mouth every evening.    carvedilol (COREG) 25 MG tablet Take 25 mg by mouth 2 (two) times daily.  Refills: 0    cinacalcet (SENSIPAR) 30 MG tablet Take 30 mg by mouth daily.    ferrous sulfate 325 (65 FE) MG tablet Take 325 mg by mouth 3 (three) times daily with meals.     hydrALAZINE (APRESOLINE) 50 MG tablet Take 100 mg by mouth 2 (two) times daily.     lisinopril (PRINIVIL,ZESTRIL) 40 MG tablet Take 40 mg by mouth at bedtime.    predniSONE (DELTASONE) 5 MG tablet Take 5 mg by mouth daily with breakfast.    sevelamer carbonate (RENVELA) 800 MG tablet Take 2,400 mg by mouth 3 (three) times daily with meals.     simvastatin (ZOCOR) 5 MG tablet Take 1 tablet (5 mg total) by mouth at bedtime. Qty: 30 tablet, Refills: 0              Today   CHIEF COMPLAINT:  Patient is doing well this morning. No shortness of breath or chest pain no headache no vision loss   VITAL SIGNS:  Blood pressure 160/103, pulse 89, temperature 98.3 F (  36.8 C), temperature source Oral, resp. rate 18, height 5' 10"  (1.778 m), weight 73.3 kg (161 lb 9.6 oz), SpO2 100 %.   REVIEW OF SYSTEMS:  Review of Systems  Constitutional: Negative for fever, chills and malaise/fatigue.  HENT: Negative for sore throat.   Eyes: Negative for blurred vision.  Respiratory: Negative for cough, hemoptysis, shortness of breath and wheezing.   Cardiovascular: Negative for chest pain, palpitations and leg swelling.  Gastrointestinal: Negative for nausea,  vomiting, abdominal pain, diarrhea and blood in stool.  Genitourinary: Negative for dysuria.  Musculoskeletal: Negative for back pain.  Neurological: Negative for dizziness, tremors and headaches.  Endo/Heme/Allergies: Does not bruise/bleed easily.     PHYSICAL EXAMINATION:  GENERAL:  50 y.o.-year-old patient lying in the bed with no acute distress.  NECK:  Supple, no jugular venous distention. No thyroid enlargement, no tenderness.  LUNGS: Normal breath sounds bilaterally, no wheezing, rales,rhonchi  No use of accessory muscles of respiration.  CARDIOVASCULAR: S1, S2 normal. No murmurs, rubs, or gallops.  ABDOMEN: Soft, non-tender, non-distended. Bowel sounds present. No organomegaly or mass.  EXTREMITIES: No pedal edema, cyanosis, or clubbing.  PSYCHIATRIC: The patient is alert and oriented x 3.  SKIN: No obvious rash, lesion, or ulcer.   DATA REVIEW:   CBC  Recent Labs Lab 12/15/15 0438  WBC 3.7*  HGB 10.0*  HCT 30.6*  PLT 126*    Chemistries   Recent Labs Lab 12/15/15 0438  NA 137  K 4.6  CL 101  CO2 25  GLUCOSE 86  BUN 32*  CREATININE 10.09*  CALCIUM 7.6*    Cardiac Enzymes  Recent Labs Lab 12/14/15 1559  TROPONINI 0.05*    Microbiology Results  @[email or web address] @  RADIOLOGY:  X-ray Chest Pa Or Ap  12/15/2015  CLINICAL DATA:  Status post right thoracentesis EXAM: CHEST 1 VIEW COMPARISON:  12/14/2015 FINDINGS: Cardiac shadow is within normal limits. A dialysis catheter is again seen and stable. There is been significant reduction in right-sided pleural effusion. A small likely loculated component is noted along the lateral aspect of the chest wall. No pneumothorax is seen. The left lung remains clear. IMPRESSION: Significant reduction in right-sided pleural effusion following thoracentesis. No pneumothorax is noted. Electronically Signed   By: Inez Catalina M.D.   On: 12/15/2015 09:54   Dg Chest 2 View  12/14/2015  CLINICAL DATA:  Hypertension.  Cough.   End-stage renal disease EXAM: CHEST  2 VIEW COMPARISON:  November 03, 2015 FINDINGS: There is a large right pleural effusion with consolidation throughout much of the right middle and lower lobes. The left lung is clear. Heart size is upper normal with pulmonary vascularity within normal limits. Central catheter tip is at the cavoatrial junction. No pneumothorax. No bone lesions. No demonstrable adenopathy. IMPRESSION: Large right pleural effusion with probable compressive atelectasis right middle and lower lobes. Left lung clear. Heart upper normal in size. Electronically Signed   By: Lowella Grip III M.D.   On: 12/14/2015 18:04   Ct Head Wo Contrast  12/14/2015  CLINICAL DATA:  Hypertension, headache, dialysis patient EXAM: CT HEAD WITHOUT CONTRAST TECHNIQUE: Contiguous axial images were obtained from the base of the skull through the vertex without intravenous contrast. COMPARISON:  Non FINDINGS: Normal ventricular morphology. No midline shift or mass effect. Normal appearance of brain parenchyma. No intracranial hemorrhage, mass lesion, or evidence acute infarction. No extra-axial fluid collections. Paranasal sinuses and mastoid air cells clear. Osseous structures unremarkable. IMPRESSION: Normal exam. Electronically Signed   By: Elta Guadeloupe  Thornton Papas M.D.   On: 12/14/2015 17:37   US Thoracentesis Asp Pleural Space W/img Guide  12/15/2015  INDICATION: Right-sided pleural effusion EXAM: ULTRASOUND GUIDED THORACENTESIS MEDICATIONS: None. ANESTHESIA/SEDATION: None COMPLICATIONS: None immediate. PROCEDURE: An ultrasound guided thoracentesis was thoroughly discussed with the patient and questions answered. The benefits, risks, alternatives and complications were also discussed. The patient understands and wishes to proceed with the procedure. Written consent was obtained. Ultrasound was performed to localize and mark an adequate pocket of fluid in the right chest. The area was then prepped and draped in the normal  sterile fashion. 1% Lidocaine was used for local anesthesia. Under ultrasound guidance a 8 French thoracentesis catheter was introduced. Thoracentesis was performed. The catheter was removed and a dressing applied. FINDINGS: A total of approximately 2 L of clear yellow fluid was removed. A fluid sample was sent for laboratory analysis. IMPRESSION: Successful ultrasound guided right thoracentesis yielding 2 L of pleural fluid. Electronically Signed   By: Inez Catalina M.D.   On: 12/15/2015 10:32      Management plans discussed with the patient and he is in agreement. Stable for discharge home  Patient should follow up with Dr Genevive Bi tomorrow  CODE STATUS:     Code Status Orders        Start     Ordered   12/14/15 2224  Full code   Continuous     12/14/15 2223    Code Status History    Date Active Date Inactive Code Status Order ID Comments User Context   11/03/2015  2:11 AM 11/04/2015  5:39 PM Full Code [id number]  Lily Kocher, MD Inpatient   09/21/2015  7:30 PM 09/24/2015  4:13 PM Full Code [id number]  Vianne Bulls, MD ED   02/14/2015 11:41 PM 02/17/2015  6:14 PM Full Code [id number]  Lavina Hamman, MD ED      TOTAL TIME TAKING CARE OF THIS PATIENT: 35 minutes.    Note: This dictation was prepared with Dragon dictation along with smaller phrase technology. Any transcriptional errors that result from this process are unintentional.  Dinh Ayotte M.D on 12/16/2015 at 11:22 AM  Between 7am to 6pm - Pager - [phone] After 6pm go to [email or web address] - password [email or web address] [facility]  [facility]  Office  [phone]  CC: Primary care physician; [name], MD

## 2015-12-16 NOTE — Progress Notes (Signed)
Pt to be discharged per MD order. IV removed. Pt has consult cardiothorax for Dr. Genevive Bi. Per Dr. Genevive Bi office he will be unavailable today. Pt will see MD in office tomorrow for follow up ,will continue discharge per MD

## 2015-12-17 LAB — CYTOLOGY - NON PAP

## 2015-12-19 LAB — CULTURE, BLOOD (ROUTINE X 2)
CULTURE: NO GROWTH
CULTURE: NO GROWTH

## 2015-12-20 LAB — BODY FLUID CULTURE: CULTURE: NO GROWTH

## 2015-12-22 ENCOUNTER — Telehealth: Payer: Self-pay | Admitting: Cardiothoracic Surgery

## 2015-12-22 LAB — TRIGLYCERIDES, BODY FLUIDS: TRIGLYCERIDES FL: 21 mg/dL

## 2015-12-22 LAB — CHOLESTEROL, BODY FLUID: CHOL FL: 63 mg/dL

## 2015-12-22 NOTE — Telephone Encounter (Signed)
Need to reschedule patients appointment from Thursday to Friday. Dr Genevive Bi wants to see him in the office at [facility] on Friday morning.  I have called multiple times today to reschedule appointment. No answer. I left messages for him to call me to reschedule appointment.  I will call again in the morning.

## 2015-12-23 ENCOUNTER — Inpatient Hospital Stay: Payer: Self-pay | Admitting: Cardiothoracic Surgery

## 2015-12-24 ENCOUNTER — Ambulatory Visit (INDEPENDENT_AMBULATORY_CARE_PROVIDER_SITE_OTHER): Payer: Self-pay | Admitting: Cardiothoracic Surgery

## 2015-12-24 ENCOUNTER — Other Ambulatory Visit: Payer: Self-pay | Admitting: Cardiothoracic Surgery

## 2015-12-24 ENCOUNTER — Ambulatory Visit
Admission: RE | Admit: 2015-12-24 | Discharge: 2015-12-24 | Disposition: A | Discharge status: Home / Self Care | Payer: Self-pay | Source: Ambulatory Visit | Attending: Cardiothoracic Surgery | Admitting: Cardiothoracic Surgery

## 2015-12-24 ENCOUNTER — Encounter: Payer: Self-pay | Admitting: Cardiothoracic Surgery

## 2015-12-24 VITALS — BP 158/96 | HR 75 | Temp 98.3°F | Ht 70.0 in | Wt 167.0 lb

## 2015-12-24 DIAGNOSIS — J90 Pleural effusion, not elsewhere classified: Secondary | ICD-10-CM | POA: Insufficient documentation

## 2015-12-24 NOTE — Progress Notes (Signed)
Patient ID: Jonathon Conley, male   DOB: 08/25/1966, 50 y.o.   MRN: [id number]  Chief Complaint  Patient presents with  . Follow-up    Pleural Effusion    Reason for Referral Recurrent right pleural effusion  HPI Location, Quality, Duration, Severity, Timing, Context, Modifying Factors, Associated Signs and Symptoms.  Jonathon Conley is a 50 y.o. male.  This gentleman is a 50 year old African-American male with a history of glomerulonephritis with end-stage renal disease. He underwent a kidney transplant approximately 15 years ago and in October 2015 was placed back on dialysis secondary to graft failure. He has had multiple fistulas placed in his right arm as well as in his left lower extremity. All these have currently felt that he is now supported with a right-sided Vas-Cath. He states that his dry weight is approximate 75 kg and he's been down to as low as 73 kg without any significant symptoms. Over the last several months he's had 2 episodes requiring right sided thoracentesis for a large right-sided pleural effusion. At times he states that he has felt somewhat short of breath with wheezing. He currently feels reasonably well although his last thoracentesis was less than 10 days ago. The fluid analysis has suggested an exudative effusion. Cytology has been negative. The patient is currently on 5 mg of prednisone and he states that he was recently told that his kidney may actually be recovering some function. He was discharged from the hospital about 10 days ago and he had a repeat chest x-ray made today which shows a moderate sized right-sided pleural effusion.   Past Medical History  Diagnosis Date  . Hypertension   . Hyperlipidemia   . Chronic kidney disease   . OSA (obstructive sleep apnea) 03/27/2013  . Blood transfusion without reported diagnosis     Past Surgical History  Procedure Laterality Date  . Kidney transplant      Family History  Problem Relation Age of Onset  . Heart  disease Mother   . Cancer Mother     ovarian  . Cancer Maternal Grandmother   . Cancer Maternal Grandfather   . Asthma Son   . Asthma Son     Social History Social History  Substance Use Topics  . Smoking status: Never Smoker   . Smokeless tobacco: Never Used  . Alcohol Use: No    No Known Allergies  Current Outpatient Prescriptions  Medication Sig Dispense Refill  . amLODipine (NORVASC) 10 MG tablet Take 10 mg by mouth daily.    Marland Kitchen aspirin EC 81 MG tablet Take 81 mg by mouth at bedtime.    . calcitRIOL (ROCALTROL) 0.5 MCG capsule Take 1 capsule (0.5 mcg total) by mouth Every Tuesday,Thursday,and Saturday with dialysis. 15 capsule 0  . Calcium Carbonate-Vitamin D (CALCIUM 600+D) 600-400 MG-UNIT tablet Take 1 tablet by mouth every evening.    . carvedilol (COREG) 25 MG tablet Take 25 mg by mouth 2 (two) times daily.   0  . cinacalcet (SENSIPAR) 30 MG tablet Take 30 mg by mouth daily.    . ferrous sulfate 325 (65 FE) MG tablet Take 325 mg by mouth 3 (three) times daily with meals.     . hydrALAZINE (APRESOLINE) 50 MG tablet Take 100 mg by mouth 2 (two) times daily.     Marland Kitchen lisinopril (PRINIVIL,ZESTRIL) 40 MG tablet Take 40 mg by mouth at bedtime.    . predniSONE (DELTASONE) 5 MG tablet Take 5 mg by mouth daily with breakfast.    .  sevelamer carbonate (RENVELA) 800 MG tablet Take 2,400 mg by mouth 3 (three) times daily with meals.     . simvastatin (ZOCOR) 10 MG tablet Take 1 tablet by mouth.  11  . simvastatin (ZOCOR) 5 MG tablet Take 1 tablet (5 mg total) by mouth at bedtime. 30 tablet 0  . traZODone (DESYREL) 50 MG tablet Take 50 mg by mouth.     No current facility-administered medications for this visit.      Review of Systems A complete review of systems was asked and was negative except for the following positive findingsHe has had about a 20 pound weight loss. He has occasional shortness of breath.  Blood pressure 158/96, pulse 75, temperature 98.3 F (36.8 C),  temperature source Oral, height 5' 10"  (1.778 m), weight 167 lb (75.751 kg).  Physical Exam CONSTITUTIONAL:  Pleasant, well-developed, well-nourished, and in no acute distress. EYES: Pupils equal and reactive to light, Sclera non-icteric EARS, NOSE, MOUTH AND THROAT:  The oropharynx was clear.  Dentition is good repair.  Oral mucosa pink and moist. LYMPH NODES:  Lymph nodes in the neck and axillae were normal RESPIRATORY:  Lungs were clear on the left and slightly diminished at the right base..  Normal respiratory effort without pathologic use of accessory muscles of respiration CARDIOVASCULAR: Heart was regular without murmurs.  There were no carotid bruits. GI: The abdomen was soft, nontender, and nondistended. There were no palpable masses. There was no hepatosplenomegaly. There were normal bowel sounds in all quadrants. GU:  Rectal deferred.   MUSCULOSKELETAL:  Normal muscle strength and tone.  No clubbing or cyanosis.   SKIN:  There were no pathologic skin lesions.  There were no nodules on palpation.  There are multiple skin grafts present over the posterior aspect of his scalp. NEUROLOGIC:  Sensation is normal.  Cranial nerves are grossly intact. PSYCH:  Oriented to person, place and time.  Mood and affect are normal.  Data Reviewed Multiple chest x-rays  I have personally reviewed the patient's imaging, laboratory findings and medical records.    Assessment    Recurrent pleural effusion on the right. Etiology is unclear but an exudative effusion is suggested. There is no evidence of malignancy or infection.    Plan    The patient is scheduled to be seen by his primary care physician next week. I had a long discussion with him regarding the options. As best I can tell the exact etiology of this pleural effusion has not been entirely decided upon. I think it would be possible to perform a thoracoscopy with pleural biopsy and fluid analysis and talc pleurodesis. Alternatives would be  a Pleurx catheter which I do not believe would be an acceptable long-term result for him.  In addition the patient receives most of his care [employer]. He is scheduled to meet with the transplant surgeons again within the next few weeks as he may have identified a potential donor. I also told him that he could have his thoracoscopy performed at [facility] if he felt that we more convenient relating to his dialysis and his kidney transplant. He had all his questions answered. He is going to meet with his primary care physician next week and I gave her my business card and asked him to have his primary care doctor contact me so that we can coordinate his care here in [place].      Nestor Lewandowsky, MD 12/24/2015, 1:46 PM

## 2016-01-04 ENCOUNTER — Encounter: Payer: Self-pay | Admitting: Internal Medicine

## 2016-01-04 ENCOUNTER — Ambulatory Visit (INDEPENDENT_AMBULATORY_CARE_PROVIDER_SITE_OTHER): Payer: Self-pay | Admitting: Internal Medicine

## 2016-01-04 VITALS — BP 177/96 | HR 84 | Temp 97.6°F | Resp 18 | Ht 70.0 in | Wt 176.0 lb

## 2016-01-04 DIAGNOSIS — I10 Essential (primary) hypertension: Secondary | ICD-10-CM

## 2016-01-04 DIAGNOSIS — G4700 Insomnia, unspecified: Secondary | ICD-10-CM | POA: Insufficient documentation

## 2016-01-04 DIAGNOSIS — J90 Pleural effusion, not elsewhere classified: Secondary | ICD-10-CM

## 2016-01-04 DIAGNOSIS — N186 End stage renal disease: Secondary | ICD-10-CM

## 2016-01-04 DIAGNOSIS — Z992 Dependence on renal dialysis: Secondary | ICD-10-CM

## 2016-01-04 MED ORDER — LISINOPRIL 40 MG PO TABS: 40.0000 mg | ORAL_TABLET | Freq: Every day | ORAL | Status: DC

## 2016-01-04 MED ORDER — TRAZODONE HCL 50 MG PO TABS: 50.0000 mg | ORAL_TABLET | Freq: Every day | ORAL | Status: DC

## 2016-01-04 NOTE — Progress Notes (Signed)
Patient is here for 1 month FU  Patient denies pain at this time.  Patient states he has taken his medications this morning around 7:30am

## 2016-01-04 NOTE — Patient Instructions (Signed)
DASH Eating Plan DASH stands for "Dietary Approaches to Stop Hypertension." The DASH eating plan is a healthy eating plan that has been shown to reduce high blood pressure (hypertension). Additional health benefits may include reducing the risk of type 2 diabetes mellitus, heart disease, and stroke. The DASH eating plan may also help with weight loss. WHAT DO I NEED TO KNOW ABOUT THE DASH EATING PLAN? For the DASH eating plan, you will follow these general guidelines:  Choose foods with a percent daily value for sodium of less than 5% (as listed on the food label).  Use salt-free seasonings or herbs instead of table salt or sea salt.  Check with your health care provider or pharmacist before using salt substitutes.  Eat lower-sodium products, often labeled as "lower sodium" or "no salt added."  Eat fresh foods.  Eat more vegetables, fruits, and low-fat dairy products.  Choose whole grains. Look for the word "whole" as the first word in the ingredient list.  Choose fish and skinless chicken or [place] more often than red meat. Limit fish, poultry, and meat to 6 oz (170 g) each day.  Limit sweets, desserts, sugars, and sugary drinks.  Choose heart-healthy fats.  Limit cheese to 1 oz (28 g) per day.  Eat more home-cooked food and less restaurant, buffet, and fast food.  Limit fried foods.  Cook foods using methods other than frying.  Limit canned vegetables. If you do use them, rinse them well to decrease the sodium.  When eating at a restaurant, ask that your food be prepared with less salt, or no salt if possible. WHAT FOODS CAN I EAT? Seek help from a dietitian for individual calorie needs. Grains Whole grain or whole wheat bread. Brown rice. Whole grain or whole wheat pasta. Quinoa, bulgur, and whole grain cereals. Low-sodium cereals. Corn or whole wheat flour tortillas. Whole grain cornbread. Whole grain crackers. Low-sodium crackers. Vegetables Fresh or frozen vegetables  (raw, steamed, roasted, or grilled). Low-sodium or reduced-sodium tomato and vegetable juices. Low-sodium or reduced-sodium tomato sauce and paste. Low-sodium or reduced-sodium canned vegetables.  Fruits All fresh, canned (in natural juice), or frozen fruits. Meat and Other Protein Products Ground beef (85% or leaner), grass-fed beef, or beef trimmed of fat. Skinless chicken or [place]. Ground chicken or [place]. Pork trimmed of fat. All fish and seafood. Eggs. Dried beans, peas, or lentils. Unsalted nuts and seeds. Unsalted canned beans. Dairy Low-fat dairy products, such as skim or 1% milk, 2% or reduced-fat cheeses, low-fat ricotta or cottage cheese, or plain low-fat yogurt. Low-sodium or reduced-sodium cheeses. Fats and Oils Tub margarines without trans fats. Light or reduced-fat mayonnaise and salad dressings (reduced sodium). Avocado. Safflower, olive, or canola oils. Natural peanut or almond butter. Other Unsalted popcorn and pretzels. The items listed above may not be a complete list of recommended foods or beverages. Contact your dietitian for more options. WHAT FOODS ARE NOT RECOMMENDED? Grains White bread. White pasta. White rice. Refined cornbread. Bagels and croissants. Crackers that contain trans fat. Vegetables Creamed or fried vegetables. Vegetables in a cheese sauce. Regular canned vegetables. Regular canned tomato sauce and paste. Regular tomato and vegetable juices. Fruits Dried fruits. Canned fruit in light or heavy syrup. Fruit juice. Meat and Other Protein Products Fatty cuts of meat. Ribs, chicken wings, bacon, sausage, bologna, salami, chitterlings, fatback, hot dogs, bratwurst, and packaged luncheon meats. Salted nuts and seeds. Canned beans with salt. Dairy Whole or 2% milk, cream, half-and-half, and cream cheese. Whole-fat or sweetened yogurt. Full-fat  cheeses or blue cheese. Nondairy creamers and whipped toppings. Processed cheese, cheese spreads, or cheese  curds. Condiments Onion and garlic salt, seasoned salt, table salt, and sea salt. Canned and packaged gravies. Worcestershire sauce. Tartar sauce. Barbecue sauce. Teriyaki sauce. Soy sauce, including reduced sodium. Steak sauce. Fish sauce. Oyster sauce. Cocktail sauce. Horseradish. Ketchup and mustard. Meat flavorings and tenderizers. Bouillon cubes. Hot sauce. Tabasco sauce. Marinades. Taco seasonings. Relishes. Fats and Oils Butter, stick margarine, lard, shortening, ghee, and bacon fat. Coconut, palm kernel, or palm oils. Regular salad dressings. Other Pickles and olives. Salted popcorn and pretzels. The items listed above may not be a complete list of foods and beverages to avoid. Contact your dietitian for more information. WHERE CAN I FIND MORE INFORMATION? National Heart, Lung, [employer]: travelstabloid.com   This information is not intended to replace advice given to you by your health care provider. Make sure you discuss any questions you have with your health care provider.   Document Released: 09/21/2011 Document Revised: 10/23/2014 Document Reviewed: 08/06/2013 Elsevier Interactive Patient Education 2016 [employer]. Hypertension Hypertension, commonly called high blood pressure, is when the force of blood pumping through your arteries is too strong. Your arteries are the blood vessels that carry blood from your heart throughout your body. A blood pressure reading consists of a higher number over a lower number, such as 110/72. The higher number (systolic) is the pressure inside your arteries when your heart pumps. The lower number (diastolic) is the pressure inside your arteries when your heart relaxes. Ideally you want your blood pressure below 120/80. Hypertension forces your heart to work harder to pump blood. Your arteries may become narrow or stiff. Having untreated or uncontrolled hypertension can cause heart attack, stroke, kidney  disease, and other problems. RISK FACTORS Some risk factors for high blood pressure are controllable. Others are not.  Risk factors you cannot control include:   Race. You may be at higher risk if you are African American.  Age. Risk increases with age.  Gender. Men are at higher risk than women before age 60 years. After age 28, women are at higher risk than men. Risk factors you can control include:  Not getting enough exercise or physical activity.  Being overweight.  Getting too much fat, sugar, calories, or salt in your diet.  Drinking too much alcohol. SIGNS AND SYMPTOMS Hypertension does not usually cause signs or symptoms. Extremely high blood pressure (hypertensive crisis) may cause headache, anxiety, shortness of breath, and nosebleed. DIAGNOSIS To check if you have hypertension, your health care provider will measure your blood pressure while you are seated, with your arm held at the level of your heart. It should be measured at least twice using the same arm. Certain conditions can cause a difference in blood pressure between your right and left arms. A blood pressure reading that is higher than normal on one occasion does not mean that you need treatment. If it is not clear whether you have high blood pressure, you may be asked to return on a different day to have your blood pressure checked again. Or, you may be asked to monitor your blood pressure at home for 1 or more weeks. TREATMENT Treating high blood pressure includes making lifestyle changes and possibly taking medicine. Living a healthy lifestyle can help lower high blood pressure. You may need to change some of your habits. Lifestyle changes may include:  Following the DASH diet. This diet is high in fruits, vegetables, and whole grains.  It is low in salt, red meat, and added sugars.  Keep your sodium intake below 2,300 mg per day.  Getting at least 30-45 minutes of aerobic exercise at least 4 times per  week.  Losing weight if necessary.  Not smoking.  Limiting alcoholic beverages.  Learning ways to reduce stress. Your health care provider may prescribe medicine if lifestyle changes are not enough to get your blood pressure under control, and if one of the following is true:  You are 6-43 years of age and your systolic blood pressure is above 140.  You are 70 years of age or older, and your systolic blood pressure is above 150.  Your diastolic blood pressure is above 90.  You have diabetes, and your systolic blood pressure is over 465 or your diastolic blood pressure is over 90.  You have kidney disease and your blood pressure is above 140/90.  You have heart disease and your blood pressure is above 140/90. Your personal target blood pressure may vary depending on your medical conditions, your age, and other factors. HOME CARE INSTRUCTIONS  Have your blood pressure rechecked as directed by your health care provider.   Take medicines only as directed by your health care provider. Follow the directions carefully. Blood pressure medicines must be taken as prescribed. The medicine does not work as well when you skip doses. Skipping doses also puts you at risk for problems.  Do not smoke.   Monitor your blood pressure at home as directed by your health care provider. SEEK MEDICAL CARE IF:   You think you are having a reaction to medicines taken.  You have recurrent headaches or feel dizzy.  You have swelling in your ankles.  You have trouble with your vision. SEEK IMMEDIATE MEDICAL CARE IF:  You develop a severe headache or confusion.  You have unusual weakness, numbness, or feel faint.  You have severe chest or abdominal pain.  You vomit repeatedly.  You have trouble breathing. MAKE SURE YOU:   Understand these instructions.  Will watch your condition.  Will get help right away if you are not doing well or get worse.   This information is not intended to  replace advice given to you by your health care provider. Make sure you discuss any questions you have with your health care provider.   Document Released: 10/02/2005 Document Revised: 02/16/2015 Document Reviewed: 07/25/2013 Elsevier Interactive Patient Education 2016 [place] Kidney Disease The kidneys are two organs that lie on either side of the spine between the middle of the back and the front of the abdomen. The kidneys:   Remove wastes and extra water from the blood.   Produce important hormones. These help keep bones strong, regulate blood pressure, and help create red blood cells.   Balance the fluids and chemicals in the blood and tissues. End-stage kidney disease occurs when the kidneys are so damaged that they cannot do their job. When the kidneys cannot do their job, life-threatening problems occur. The body cannot stay clean and strong without the help of the kidneys. In end-stage kidney disease, the kidneys cannot get better.You need a new kidney or treatments to do some of the work healthy kidneys do in order to stay alive. CAUSES  End-stage kidney disease usually occurs when a long-lasting (chronic) kidney disease gets worse. It may also occur after the kidneys are suddenly damaged (acute kidney injury).  SYMPTOMS   Swelling (edema) of the legs, ankles, or feet.   Tiredness (lethargy).  Nausea or vomiting.   Confusion.   Problems with urination, such as:   Decreased urine production.   Frequent urination, especially at night.   Frequent accidents in children who are potty trained.   Muscle twitches and cramps.   Persistent itchiness.   Loss of appetite.   Headaches.   Abnormally dark or light skin.   Numbness in the hands or feet.   Easy bruising.   Frequent hiccups.   Menstruation stops. DIAGNOSIS  Your health care provider will measure your blood pressure and take some tests. These may include:   Urine tests.    Blood tests.   Imaging tests, such as:   An ultrasound exam.   Computed tomography (CT).  A kidney biopsy. TREATMENT  There are two treatments for end-stage kidney disease:   A procedure that removes toxic wastes from the body (dialysis).   Receiving a new kidney (kidney transplant). Both of these treatments have serious risks and consequences. Your health care provider will help you determine which treatment is best for you based on your health, age, and other factors. In addition to having dialysis or a kidney transplant, you may need to take medicines to control high blood pressure (hypertension) and cholesterol and to decrease phosphorus levels in your blood.  HOME CARE INSTRUCTIONS  Follow your prescribed diet.   Take medicines only as directed by your health care provider.   Do not take any new medicines (prescription, over-the-counter, or nutritional supplements) unless approved by your health care provider. Many medicines can worsen your kidney damage or need to have the dose adjusted.   Keep all follow-up visits as directed by your health care provider. MAKE SURE YOU:  Understand these instructions.  Will watch your condition.  Will get help right away if you are not doing well or get worse.   This information is not intended to replace advice given to you by your health care provider. Make sure you discuss any questions you have with your health care provider.   Document Released: 12/23/2003 Document Revised: 10/23/2014 Document Reviewed: 05/31/2012 Elsevier Interactive Patient Education 2016 [employer]  Pleural Effusion A pleural effusion is an abnormal buildup of fluid in the layers of tissue between your lungs and the inside of your chest (pleural space). These two layers of tissue that line both your lungs and the inside of your chest are called pleura. Usually, there is no air in the space between the pleura, only a thin layer of fluid. If left  untreated, a large amount of fluid can build up and cause the lung to collapse. A pleural effusion is usually caused by another disease that requires treatment. The two main types of pleural effusion are:  Transudative pleural effusion. This happens when fluid leaks into the pleural space because of a low protein count in your blood or high blood pressure in your vessels. Heart failure often causes this.  Exudative infusion. This occurs when fluid collects in the pleural space from blocked blood vessels or lymph vessels. Some lung diseases, injuries, and cancers can cause this type of effusion. CAUSES Pleural effusion can be caused by:  Heart failure.  A blood clot in the lung (pulmonary embolism).  Pneumonia.  Cancer.  Liver failure (cirrhosis).  Kidney disease.  Complications from surgery, such as from open heart surgery. SIGNS AND SYMPTOMS In some cases, pleural effusion may cause no symptoms. Symptoms can include:  Shortness of breath, especially when lying down.  Chest pain, often worse when taking  a deep breath.  Fever.  Dry cough that is lasting (chronic).  Hiccups.  Rapid breathing. An underlying condition that is causing the pleural effusion (such as heart failure, pneumonia, blood clots, tuberculosis, or cancer) may also cause additional symptoms. DIAGNOSIS Your health care provider may suspect pleural effusion based on your symptoms and medical history. Your health care provider will also do a physical exam and a chest X-ray. If the X-ray shows there is fluid in your chest, you may need to have this fluid removed using a needle (thoracentesis) so it can be tested. You may also have:  Imaging studies of the chest, such as:  Ultrasound.  CT scan.  Blood tests for kidney and liver function. TREATMENT Treatment depends on the cause of the pleural effusion. Treatment may include:  Taking antibiotic medicines to clear up an infection that is causing the pleural  effusion.  Placing a tube in the chest to drain the effusion (tube thoracostomy). This procedure is often used when there is an infection in the fluid.  Surgery to remove the fibrous outer layer of tissue from the pleural space (decortication).  Thoracentesis, which can improve cough and shortness of breath.  A procedure to put medicine into the chest cavity to seal the pleural space to prevent fluid buildup (pleurodesis).  Chemotherapy and radiation therapy. These may be required in the case of cancerous (malignant) pleural effusion. HOME CARE INSTRUCTIONS  Take medicines only as directed by your health care provider.  Keep track of how long you can gently exercise before you get short of breath. Try simply walking at first.  Do not use any tobacco products, including cigarettes, chewing tobacco, or electronic cigarettes. If you need help quitting, ask your health care provider.  Keep all follow-up visits as directed by your health care provider. This is important. SEEK MEDICAL CARE IF:  The amount of time that you are able to exercise decreases or does not improve with time.  You have pain or signs of infection at the puncture site if you had thoracentesis. Watch for:  Drainage.  Redness.  Swelling.  You have a fever. SEEK IMMEDIATE MEDICAL CARE IF:  You are short of breath.  You develop chest pain.  You develop a new cough. MAKE SURE YOU:  Understand these instructions.  Will watch your condition.  Will get help right away if you are not doing well or get worse.   This information is not intended to replace advice given to you by your health care provider. Make sure you discuss any questions you have with your health care provider.   Document Released: 10/02/2005 Document Revised: 10/23/2014 Document Reviewed: 02/25/2014 Elsevier Interactive Patient Education [employer].

## 2016-01-04 NOTE — Progress Notes (Signed)
Patient ID: Jonathon Conley, male   DOB: 1966-01-04, 50 y.o.   MRN: [id number]   Jonathon Conley, is a 50 y.o. male  HBZ:[id number]  BOF:[id number]  DOB - [birth year]  Chief Complaint  Patient presents with  . Follow-up    1 MONTH         Subjective:   Jonathon Conley is a 50 y.o. male with history of glomerulonephritis status post kidney transplant 15 years ago, failed in 2015 October, now end-stage renal disease on hemodialysis, uncontrolled hypertension on multiple medications, dyslipidemia and obstructive sleep apnea on CPAP here today for a follow up visit. He has had 2 episodes of recurring right-sided pleural effusion status post thoracentesis twice, the first took about 4 weeks to reaccumulate but the second was less than 2 weeks in between. The fluid analysis has suggested an exudative effusion, cytology negative so far. Patient is currently on prednisone. He saw Dr. Genevive Bi (thoracic surgeon) on 12/24/2015 for options of pleural effusion management, he is currently being considered for pleurodesis. He has an appointment with specialist at [facility] tomorrow to consider other options as well including that he is on kidney transplant list at this time. He has no new complaints today, needs refill on his lisinopril. His blood pressure has been uncontrolled for a while, lisinopril was recently increased to 40 mg tablet by mouth daily and blood pressure is now better controlled. He is going for dialysis today. No Patient has No headache, No chest pain, No abdominal pain - No Nausea, No new weakness tingling or numbness. He has not filled his prescribed trazodone which may help him with his insomnia. He needs a new prescription today. His wife is present at this encounter.  Problem  Insomnia    ALLERGIES: No Known Allergies  PAST MEDICAL HISTORY: Past Medical History  Diagnosis Date  . Hypertension   . Hyperlipidemia   . Chronic kidney disease   . OSA (obstructive sleep apnea) 03/27/2013  .  Blood transfusion without reported diagnosis     MEDICATIONS AT HOME: Prior to Admission medications   Medication Sig Start Date End Date Taking? Authorizing Provider  amLODipine (NORVASC) 10 MG tablet Take 10 mg by mouth daily.   Yes Historical Provider, MD  aspirin EC 81 MG tablet Take 81 mg by mouth at bedtime.   Yes Historical Provider, MD  calcitRIOL (ROCALTROL) 0.5 MCG capsule Take 1 capsule (0.5 mcg total) by mouth Every Tuesday,Thursday,and Saturday with dialysis. 09/24/15  Yes Orson Eva, MD  Calcium Carbonate-Vitamin D (CALCIUM 600+D) 600-400 MG-UNIT tablet Take 1 tablet by mouth every evening.   Yes Historical Provider, MD  carvedilol (COREG) 25 MG tablet Take 25 mg by mouth 2 (two) times daily.    Yes Historical Provider, MD  cinacalcet (SENSIPAR) 30 MG tablet Take 30 mg by mouth daily.   Yes Historical Provider, MD  ferrous sulfate 325 (65 FE) MG tablet Take 325 mg by mouth 3 (three) times daily with meals.    Yes Historical Provider, MD  hydrALAZINE (APRESOLINE) 50 MG tablet Take 100 mg by mouth 2 (two) times daily.    Yes Historical Provider, MD  lisinopril (PRINIVIL,ZESTRIL) 40 MG tablet Take 1 tablet (40 mg total) by mouth at bedtime. 01/04/16  Yes Kennetta Pavlovic Essie Christine, MD  predniSONE (DELTASONE) 5 MG tablet Take 5 mg by mouth daily with breakfast.   Yes Historical Provider, MD  sevelamer carbonate (RENVELA) 800 MG tablet Take 2,400 mg by mouth 3 (three) times daily with meals.  Yes Historical Provider, MD  simvastatin (ZOCOR) 10 MG tablet Take 1 tablet by mouth. 12/14/15  Yes Historical Provider, MD  simvastatin (ZOCOR) 5 MG tablet Take 1 tablet (5 mg total) by mouth at bedtime. 02/17/15  Yes Shanker Kristeen Mans, MD  traZODone (DESYREL) 50 MG tablet Take 1 tablet (50 mg total) by mouth at bedtime. 01/04/16 02/03/16  Tresa Garter, MD     Objective:   Filed Vitals:   01/04/16 0829  BP: 177/96  Pulse: 84  Temp: 97.6 F (36.4 C)  TempSrc: Oral  Resp: 18  Height: 5' 10"   (1.778 m)  Weight: 176 lb (79.833 kg)  SpO2: 100%    Exam General appearance : Pleasant man, positive attitude, Awake, alert, not in any distress. Speech Clear. Not toxic looking HEENT: Atraumatic and Normocephalic, pupils equally reactive to light and accomodation Neck: supple, no JVD. No cervical lymphadenopathy. Double-lumen right-sided vas-catheter in situ Chest: Slightly diminished air entry on the right lung bases posteriorly, no crepitations Good air entry on the left, no added sounds  CVS: S1 S2 regular, no murmurs.  Abdomen: Bowel sounds present, Non tender and not distended with no gaurding, rigidity or rebound. Extremities: B/L Lower Ext shows minimal pitting edema, both legs are warm to touch Neurology: Awake alert, and oriented X 3, CN II-XII intact, Non focal  Data Review No results found for: HGBA1C   Assessment & Plan  I have personally reviewed the patient's imaging studies, laboratory findings and medical records including care everywhere.  1. ESRD on dialysis [facility]) Continue hemodialysis Tuesday, Thursday and Saturday Follow-up with nephrologist as scheduled Patient has appointment at [facility], patient is on kidney transplant list and this will be reviewed at tomorrow's visit according to patient. Renal diet  2. Accelerated hypertension Blood pressure is better controlled although still high, nephrologist adjusted blood pressure medication, patient is out of lisinopril, will refill. - lisinopril (PRINIVIL,ZESTRIL) 40 MG tablet; Take 1 tablet (40 mg total) by mouth at bedtime.  Dispense: 90 tablet; Refill: 3  We have discussed target BP range and blood pressure goal. I have advised patient to check BP regularly and to call us back or report to clinic if the numbers are consistently higher than 140/90. We discussed the importance of compliance with medical therapy and DASH diet recommended, consequences of uncontrolled hypertension discussed.   -  continue current BP medications  3. Insomnia  - traZODone (DESYREL) 50 MG tablet; Take 1 tablet (50 mg total) by mouth at bedtime.  Dispense: 90 tablet; Refill: 3  4. Pleural effusion  Paged Dr. Genevive Bi, thoracic surgeon, awaiting his call back to discuss and coordinate the patient's care in [facility] I agree with thoracoscopy with pleural biopsy and fluid analysis and PLEURODESIS - I have told patient to discuss this option with the specialists at [facility] tomorrow during his visit  Patient have been counseled extensively about nutrition and exercise  Return in about 4 weeks (around 02/01/2016) for CKD/ESRD, Follow up HTN.  The patient was given clear instructions to go to ER or return to medical center if symptoms don't improve, worsen or new problems develop. The patient verbalized understanding. The patient was told to call to get lab results if they haven't heard anything in the next week.   This note has been created with Surveyor, quantity. Any transcriptional errors are unintentional.    Chinmayi Rumer, MD, MHA, [place], [place], [place] and [place],  [place] [phone]   01/04/2016, 8:59 AM

## 2016-01-15 DIAGNOSIS — N186 End stage renal disease: Secondary | ICD-10-CM | POA: Diagnosis not present

## 2016-01-15 DIAGNOSIS — D631 Anemia in chronic kidney disease: Secondary | ICD-10-CM | POA: Diagnosis not present

## 2016-01-18 DIAGNOSIS — N186 End stage renal disease: Secondary | ICD-10-CM | POA: Diagnosis not present

## 2016-01-18 DIAGNOSIS — D631 Anemia in chronic kidney disease: Secondary | ICD-10-CM | POA: Diagnosis not present

## 2016-01-20 DIAGNOSIS — N186 End stage renal disease: Secondary | ICD-10-CM | POA: Diagnosis not present

## 2016-01-20 DIAGNOSIS — D631 Anemia in chronic kidney disease: Secondary | ICD-10-CM | POA: Diagnosis not present

## 2016-01-22 DIAGNOSIS — D631 Anemia in chronic kidney disease: Secondary | ICD-10-CM | POA: Diagnosis not present

## 2016-01-22 DIAGNOSIS — N186 End stage renal disease: Secondary | ICD-10-CM | POA: Diagnosis not present

## 2016-01-25 DIAGNOSIS — D631 Anemia in chronic kidney disease: Secondary | ICD-10-CM | POA: Diagnosis not present

## 2016-01-25 DIAGNOSIS — N186 End stage renal disease: Secondary | ICD-10-CM | POA: Diagnosis not present

## 2016-01-27 DIAGNOSIS — D631 Anemia in chronic kidney disease: Secondary | ICD-10-CM | POA: Diagnosis not present

## 2016-01-27 DIAGNOSIS — N186 End stage renal disease: Secondary | ICD-10-CM | POA: Diagnosis not present

## 2016-01-29 DIAGNOSIS — D631 Anemia in chronic kidney disease: Secondary | ICD-10-CM | POA: Diagnosis not present

## 2016-01-29 DIAGNOSIS — N186 End stage renal disease: Secondary | ICD-10-CM | POA: Diagnosis not present

## 2016-02-01 ENCOUNTER — Ambulatory Visit (INDEPENDENT_AMBULATORY_CARE_PROVIDER_SITE_OTHER): Payer: Medicare Other | Admitting: Internal Medicine

## 2016-02-01 ENCOUNTER — Encounter: Payer: Self-pay | Admitting: Internal Medicine

## 2016-02-01 VITALS — BP 133/80 | HR 78 | Temp 97.6°F | Resp 18 | Ht 71.0 in | Wt 175.0 lb

## 2016-02-01 DIAGNOSIS — N186 End stage renal disease: Secondary | ICD-10-CM

## 2016-02-01 DIAGNOSIS — Z992 Dependence on renal dialysis: Secondary | ICD-10-CM

## 2016-02-01 DIAGNOSIS — D631 Anemia in chronic kidney disease: Secondary | ICD-10-CM | POA: Diagnosis not present

## 2016-02-01 NOTE — Progress Notes (Signed)
Patient ID: Jonathon Conley, male   DOB: 10/28/1965, 50 y.o.   MRN: [id number] Patient was not seen. Had to go for his dialysis session. Appointment rescheduled till next week Tuesday.

## 2016-02-01 NOTE — Progress Notes (Signed)
Patient is here for 1 month FU  Patient denies pain at this time.  Patient complains of acute congestion beginning this morning upon leaving his home.

## 2016-02-03 DIAGNOSIS — N186 End stage renal disease: Secondary | ICD-10-CM | POA: Diagnosis not present

## 2016-02-03 DIAGNOSIS — D631 Anemia in chronic kidney disease: Secondary | ICD-10-CM | POA: Diagnosis not present

## 2016-02-05 DIAGNOSIS — D631 Anemia in chronic kidney disease: Secondary | ICD-10-CM | POA: Diagnosis not present

## 2016-02-05 DIAGNOSIS — N186 End stage renal disease: Secondary | ICD-10-CM | POA: Diagnosis not present

## 2016-02-08 ENCOUNTER — Ambulatory Visit (INDEPENDENT_AMBULATORY_CARE_PROVIDER_SITE_OTHER): Payer: Medicare Other | Admitting: Internal Medicine

## 2016-02-08 ENCOUNTER — Encounter: Payer: Self-pay | Admitting: Internal Medicine

## 2016-02-08 VITALS — BP 153/92 | HR 79 | Temp 97.8°F | Resp 18 | Ht 70.0 in | Wt 173.0 lb

## 2016-02-08 DIAGNOSIS — Z992 Dependence on renal dialysis: Secondary | ICD-10-CM | POA: Diagnosis not present

## 2016-02-08 DIAGNOSIS — J90 Pleural effusion, not elsewhere classified: Secondary | ICD-10-CM

## 2016-02-08 DIAGNOSIS — N186 End stage renal disease: Secondary | ICD-10-CM

## 2016-02-08 DIAGNOSIS — M7022 Olecranon bursitis, left elbow: Secondary | ICD-10-CM

## 2016-02-08 DIAGNOSIS — D631 Anemia in chronic kidney disease: Secondary | ICD-10-CM | POA: Diagnosis not present

## 2016-02-08 NOTE — Patient Instructions (Signed)
End-Stage Kidney Disease The kidneys are two organs that lie on either side of the spine between the middle of the back and the front of the abdomen. The kidneys:   Remove wastes and extra water from the blood.   Produce important hormones. These help keep bones strong, regulate blood pressure, and help create red blood cells.   Balance the fluids and chemicals in the blood and tissues. End-stage kidney disease occurs when the kidneys are so damaged that they cannot do their job. When the kidneys cannot do their job, life-threatening problems occur. The body cannot stay clean and strong without the help of the kidneys. In end-stage kidney disease, the kidneys cannot get better.You need a new kidney or treatments to do some of the work healthy kidneys do in order to stay alive. CAUSES  End-stage kidney disease usually occurs when a long-lasting (chronic) kidney disease gets worse. It may also occur after the kidneys are suddenly damaged (acute kidney injury).  SYMPTOMS   Swelling (edema) of the legs, ankles, or feet.   Tiredness (lethargy).   Nausea or vomiting.   Confusion.   Problems with urination, such as:   Decreased urine production.   Frequent urination, especially at night.   Frequent accidents in children who are potty trained.   Muscle twitches and cramps.   Persistent itchiness.   Loss of appetite.   Headaches.   Abnormally dark or light skin.   Numbness in the hands or feet.   Easy bruising.   Frequent hiccups.   Menstruation stops. DIAGNOSIS  Your health care provider will measure your blood pressure and take some tests. These may include:   Urine tests.   Blood tests.   Imaging tests, such as:   An ultrasound exam.   Computed tomography (CT).  A kidney biopsy. TREATMENT  There are two treatments for end-stage kidney disease:   A procedure that removes toxic wastes from the body (dialysis).   Receiving a new kidney  (kidney transplant). Both of these treatments have serious risks and consequences. Your health care provider will help you determine which treatment is best for you based on your health, age, and other factors. In addition to having dialysis or a kidney transplant, you may need to take medicines to control high blood pressure (hypertension) and cholesterol and to decrease phosphorus levels in your blood.  HOME CARE INSTRUCTIONS  Follow your prescribed diet.   Take medicines only as directed by your health care provider.   Do not take any new medicines (prescription, over-the-counter, or nutritional supplements) unless approved by your health care provider. Many medicines can worsen your kidney damage or need to have the dose adjusted.   Keep all follow-up visits as directed by your health care provider. MAKE SURE YOU:  Understand these instructions.  Will watch your condition.  Will get help right away if you are not doing well or get worse.   This information is not intended to replace advice given to you by your health care provider. Make sure you discuss any questions you have with your health care provider.   Document Released: 12/23/2003 Document Revised: 10/23/2014 Document Reviewed: 05/31/2012 Elsevier Interactive Patient Education 2016 [employer]. Hypertension Hypertension, commonly called high blood pressure, is when the force of blood pumping through your arteries is too strong. Your arteries are the blood vessels that carry blood from your heart throughout your body. A blood pressure reading consists of a higher number over a lower number, such as 110/72. The  higher number (systolic) is the pressure inside your arteries when your heart pumps. The lower number (diastolic) is the pressure inside your arteries when your heart relaxes. Ideally you want your blood pressure below 120/80. Hypertension forces your heart to work harder to pump blood. Your arteries may become narrow  or stiff. Having untreated or uncontrolled hypertension can cause heart attack, stroke, kidney disease, and other problems. RISK FACTORS Some risk factors for high blood pressure are controllable. Others are not.  Risk factors you cannot control include:   Race. You may be at higher risk if you are African American.  Age. Risk increases with age.  Gender. Men are at higher risk than women before age 42 years. After age 60, women are at higher risk than men. Risk factors you can control include:  Not getting enough exercise or physical activity.  Being overweight.  Getting too much fat, sugar, calories, or salt in your diet.  Drinking too much alcohol. SIGNS AND SYMPTOMS Hypertension does not usually cause signs or symptoms. Extremely high blood pressure (hypertensive crisis) may cause headache, anxiety, shortness of breath, and nosebleed. DIAGNOSIS To check if you have hypertension, your health care provider will measure your blood pressure while you are seated, with your arm held at the level of your heart. It should be measured at least twice using the same arm. Certain conditions can cause a difference in blood pressure between your right and left arms. A blood pressure reading that is higher than normal on one occasion does not mean that you need treatment. If it is not clear whether you have high blood pressure, you may be asked to return on a different day to have your blood pressure checked again. Or, you may be asked to monitor your blood pressure at home for 1 or more weeks. TREATMENT Treating high blood pressure includes making lifestyle changes and possibly taking medicine. Living a healthy lifestyle can help lower high blood pressure. You may need to change some of your habits. Lifestyle changes may include:  Following the DASH diet. This diet is high in fruits, vegetables, and whole grains. It is low in salt, red meat, and added sugars.  Keep your sodium intake below 2,300  mg per day.  Getting at least 30-45 minutes of aerobic exercise at least 4 times per week.  Losing weight if necessary.  Not smoking.  Limiting alcoholic beverages.  Learning ways to reduce stress. Your health care provider may prescribe medicine if lifestyle changes are not enough to get your blood pressure under control, and if one of the following is true:  You are 12-12 years of age and your systolic blood pressure is above 140.  You are 69 years of age or older, and your systolic blood pressure is above 150.  Your diastolic blood pressure is above 90.  You have diabetes, and your systolic blood pressure is over 132 or your diastolic blood pressure is over 90.  You have kidney disease and your blood pressure is above 140/90.  You have heart disease and your blood pressure is above 140/90. Your personal target blood pressure may vary depending on your medical conditions, your age, and other factors. HOME CARE INSTRUCTIONS  Have your blood pressure rechecked as directed by your health care provider.   Take medicines only as directed by your health care provider. Follow the directions carefully. Blood pressure medicines must be taken as prescribed. The medicine does not work as well when you skip doses. Skipping doses also  puts you at risk for problems.  Do not smoke.   Monitor your blood pressure at home as directed by your health care provider. SEEK MEDICAL CARE IF:   You think you are having a reaction to medicines taken.  You have recurrent headaches or feel dizzy.  You have swelling in your ankles.  You have trouble with your vision. SEEK IMMEDIATE MEDICAL CARE IF:  You develop a severe headache or confusion.  You have unusual weakness, numbness, or feel faint.  You have severe chest or abdominal pain.  You vomit repeatedly.  You have trouble breathing. MAKE SURE YOU:   Understand these instructions.  Will watch your condition.  Will get help right  away if you are not doing well or get worse.   This information is not intended to replace advice given to you by your health care provider. Make sure you discuss any questions you have with your health care provider.   Document Released: 10/02/2005 Document Revised: 02/16/2015 Document Reviewed: 07/25/2013 Elsevier Interactive Patient Education [employer].

## 2016-02-08 NOTE — Progress Notes (Signed)
Patient ID: Jonathon Conley, male   DOB: 07/13/1966, 50 y.o.   MRN: [id number]   Jonathon Conley, is a 50 y.o. male  JQZ:[id number]  [id number]:[id number]  DOB - 23-Sep-1966  No chief complaint on file.       Subjective:   Jonathon Conley is a 50 y.o. male  with history of glomerulonephritis status post kidney transplant 15 years ago, failed in 2015 October, now end-stage renal disease on hemodialysis, uncontrolled hypertension on multiple medications, dyslipidemia and obstructive sleep apnea on CPAP here today for a follow up visit. Patient has dialysis session today. He has no complaint except for the swelling on the back of his right elbow joint that started about a month ago, slowly bulging. It is not painful, no redness, no fever associated. Patient had no history of trauma. Since the last visit he has not been to the ED, no increasing shortness of breath, no chest pain. Blood pressure has been better controlled. He is yet to hear back from the thoracic surgeon about the decision on his pleural effusion, he has an appointment coming up and will discuss options for possible pleurodesis. Patient has No headache, No chest pain, No abdominal pain - No Nausea, No new weakness tingling or numbness.  No problems updated.  ALLERGIES: No Known Allergies  PAST MEDICAL HISTORY: Past Medical History  Diagnosis Date  . Hypertension   . Hyperlipidemia   . Chronic kidney disease   . OSA (obstructive sleep apnea) 03/27/2013  . Blood transfusion without reported diagnosis     MEDICATIONS AT HOME: Prior to Admission medications   Medication Sig Start Date End Date Taking? Authorizing Provider  amLODipine (NORVASC) 10 MG tablet Take 10 mg by mouth daily.   Yes Historical Provider, MD  aspirin EC 81 MG tablet Take 81 mg by mouth at bedtime.   Yes Historical Provider, MD  calcitRIOL (ROCALTROL) 0.5 MCG capsule Take 1 capsule (0.5 mcg total) by mouth Every Tuesday,Thursday,and Saturday with dialysis. 09/24/15  Yes Orson Eva, MD  Calcium Carbonate-Vitamin D (CALCIUM 600+D) 600-400 MG-UNIT tablet Take 1 tablet by mouth every evening.   Yes Historical Provider, MD  carvedilol (COREG) 25 MG tablet Take 25 mg by mouth 2 (two) times daily.    Yes Historical Provider, MD  ferrous sulfate 325 (65 FE) MG tablet Take 325 mg by mouth 3 (three) times daily with meals.    Yes Historical Provider, MD  hydrALAZINE (APRESOLINE) 50 MG tablet Take 100 mg by mouth 2 (two) times daily.    Yes Historical Provider, MD  lisinopril (PRINIVIL,ZESTRIL) 40 MG tablet Take 1 tablet (40 mg total) by mouth at bedtime. 01/04/16  Yes Tresa Garter, MD  predniSONE (DELTASONE) 5 MG tablet Take 5 mg by mouth daily with breakfast.   Yes Historical Provider, MD  simvastatin (ZOCOR) 10 MG tablet Take 1 tablet by mouth. 12/14/15  Yes Historical Provider, MD  simvastatin (ZOCOR) 5 MG tablet Take 1 tablet (5 mg total) by mouth at bedtime. 02/17/15  Yes Shanker Kristeen Mans, MD  cinacalcet (SENSIPAR) 30 MG tablet Take 30 mg by mouth daily. Reported on 02/08/2016    Historical Provider, MD  sevelamer carbonate (RENVELA) 800 MG tablet Take 2,400 mg by mouth 3 (three) times daily with meals. Reported on 02/08/2016    Historical Provider, MD  traZODone (DESYREL) 50 MG tablet Take 1 tablet (50 mg total) by mouth at bedtime. Patient not taking: Reported on 02/01/2016 01/04/16 02/03/16  Tresa Garter, MD  Objective:   Filed Vitals:   02/08/16 0844  BP: 153/92  Pulse: 79  Temp: 97.8 F (36.6 C)  TempSrc: Oral  Resp: 18  Height: 5' 10"  (1.778 m)  Weight: 173 lb (78.472 kg)  SpO2: 99%    Exam General appearance : Awake, alert, not in any distress. Speech Clear. Not toxic looking HEENT: Atraumatic and Normocephalic, pupils equally reactive to light and accomodation Neck: supple, no JVD. No cervical lymphadenopathy.  Chest: Reduced air entry on the right hemithorax posteriorly CVS: S1 S2 regular, no murmurs.  Abdomen: Bowel sounds present, Non  tender and not distended with no gaurding, rigidity or rebound. Extremities: Bulging right elbow joint on the olecranon process. B/L Lower Ext shows no edema, both legs are warm to touch Neurology: Awake alert, and oriented X 3, CN II-XII intact, Non focal  Data Review No results found for: HGBA1C   Assessment & Plan   1. ESRD on dialysis [facility])  - Continue hemodialysis Tuesday, Thursday and Saturday - Follow-up with nephrologist as scheduled - Patient is on kidney transplant list at [facility] - Renal diet  2. Pleural effusion  - Follow up with thoracic Surgeon as scheduled - I agree with thoracoscopy with pleural biopsy and fluid analysis and PLEURODESIS  3. Bursitis of elbow, left  - DG Elbow Complete Left; Future  Patient have been counseled extensively about nutrition and exercise  Return in about 3 months (around 05/09/2016) for Follow up HTN, CKD/ESRD.  The patient was given clear instructions to go to ER or return to medical center if symptoms don't improve, worsen or new problems develop. The patient verbalized understanding. The patient was told to call to get lab results if they haven't heard anything in the next week.   This note has been created with Surveyor, quantity. Any transcriptional errors are unintentional.    [name], MD, [place], Karilyn Cota, [place] and [facility] [place], [place]   02/08/2016, 9:15 AM

## 2016-02-08 NOTE — Progress Notes (Signed)
Patient is here for FU  Patient denies pain at this time.  Patient has taken medication prior to office visit and patient has not eaten.

## 2016-02-10 DIAGNOSIS — D631 Anemia in chronic kidney disease: Secondary | ICD-10-CM | POA: Diagnosis not present

## 2016-02-10 DIAGNOSIS — N186 End stage renal disease: Secondary | ICD-10-CM | POA: Diagnosis not present

## 2016-02-12 DIAGNOSIS — D631 Anemia in chronic kidney disease: Secondary | ICD-10-CM | POA: Diagnosis not present

## 2016-02-12 DIAGNOSIS — N186 End stage renal disease: Secondary | ICD-10-CM | POA: Diagnosis not present

## 2016-02-13 DIAGNOSIS — N186 End stage renal disease: Secondary | ICD-10-CM | POA: Diagnosis not present

## 2016-02-13 DIAGNOSIS — Z992 Dependence on renal dialysis: Secondary | ICD-10-CM | POA: Diagnosis not present

## 2016-02-13 DIAGNOSIS — T8610 Unspecified complication of kidney transplant: Secondary | ICD-10-CM | POA: Diagnosis not present

## 2016-02-15 DIAGNOSIS — D509 Iron deficiency anemia, unspecified: Secondary | ICD-10-CM | POA: Diagnosis not present

## 2016-02-15 DIAGNOSIS — D631 Anemia in chronic kidney disease: Secondary | ICD-10-CM | POA: Diagnosis not present

## 2016-02-15 DIAGNOSIS — N186 End stage renal disease: Secondary | ICD-10-CM | POA: Diagnosis not present

## 2016-02-19 DIAGNOSIS — N186 End stage renal disease: Secondary | ICD-10-CM | POA: Diagnosis not present

## 2016-02-19 DIAGNOSIS — D509 Iron deficiency anemia, unspecified: Secondary | ICD-10-CM | POA: Diagnosis not present

## 2016-02-19 DIAGNOSIS — D631 Anemia in chronic kidney disease: Secondary | ICD-10-CM | POA: Diagnosis not present

## 2016-02-22 DIAGNOSIS — N186 End stage renal disease: Secondary | ICD-10-CM | POA: Diagnosis not present

## 2016-02-22 DIAGNOSIS — D509 Iron deficiency anemia, unspecified: Secondary | ICD-10-CM | POA: Diagnosis not present

## 2016-02-22 DIAGNOSIS — D631 Anemia in chronic kidney disease: Secondary | ICD-10-CM | POA: Diagnosis not present

## 2016-02-24 DIAGNOSIS — D509 Iron deficiency anemia, unspecified: Secondary | ICD-10-CM | POA: Diagnosis not present

## 2016-02-24 DIAGNOSIS — N186 End stage renal disease: Secondary | ICD-10-CM | POA: Diagnosis not present

## 2016-02-24 DIAGNOSIS — D631 Anemia in chronic kidney disease: Secondary | ICD-10-CM | POA: Diagnosis not present

## 2016-02-25 DIAGNOSIS — N186 End stage renal disease: Secondary | ICD-10-CM | POA: Diagnosis not present

## 2016-02-25 DIAGNOSIS — D509 Iron deficiency anemia, unspecified: Secondary | ICD-10-CM | POA: Diagnosis not present

## 2016-02-25 DIAGNOSIS — D631 Anemia in chronic kidney disease: Secondary | ICD-10-CM | POA: Diagnosis not present

## 2016-02-29 DIAGNOSIS — N186 End stage renal disease: Secondary | ICD-10-CM | POA: Diagnosis not present

## 2016-02-29 DIAGNOSIS — D631 Anemia in chronic kidney disease: Secondary | ICD-10-CM | POA: Diagnosis not present

## 2016-02-29 DIAGNOSIS — D509 Iron deficiency anemia, unspecified: Secondary | ICD-10-CM | POA: Diagnosis not present

## 2016-03-02 DIAGNOSIS — N186 End stage renal disease: Secondary | ICD-10-CM | POA: Diagnosis not present

## 2016-03-02 DIAGNOSIS — D631 Anemia in chronic kidney disease: Secondary | ICD-10-CM | POA: Diagnosis not present

## 2016-03-02 DIAGNOSIS — D509 Iron deficiency anemia, unspecified: Secondary | ICD-10-CM | POA: Diagnosis not present

## 2016-03-07 DIAGNOSIS — D509 Iron deficiency anemia, unspecified: Secondary | ICD-10-CM | POA: Diagnosis not present

## 2016-03-07 DIAGNOSIS — N186 End stage renal disease: Secondary | ICD-10-CM | POA: Diagnosis not present

## 2016-03-07 DIAGNOSIS — D631 Anemia in chronic kidney disease: Secondary | ICD-10-CM | POA: Diagnosis not present

## 2016-03-09 DIAGNOSIS — N186 End stage renal disease: Secondary | ICD-10-CM | POA: Diagnosis not present

## 2016-03-09 DIAGNOSIS — D509 Iron deficiency anemia, unspecified: Secondary | ICD-10-CM | POA: Diagnosis not present

## 2016-03-09 DIAGNOSIS — D631 Anemia in chronic kidney disease: Secondary | ICD-10-CM | POA: Diagnosis not present

## 2016-03-11 DIAGNOSIS — D631 Anemia in chronic kidney disease: Secondary | ICD-10-CM | POA: Diagnosis not present

## 2016-03-11 DIAGNOSIS — N186 End stage renal disease: Secondary | ICD-10-CM | POA: Diagnosis not present

## 2016-03-11 DIAGNOSIS — D509 Iron deficiency anemia, unspecified: Secondary | ICD-10-CM | POA: Diagnosis not present

## 2016-03-14 DIAGNOSIS — D509 Iron deficiency anemia, unspecified: Secondary | ICD-10-CM | POA: Diagnosis not present

## 2016-03-14 DIAGNOSIS — N186 End stage renal disease: Secondary | ICD-10-CM | POA: Diagnosis not present

## 2016-03-14 DIAGNOSIS — D631 Anemia in chronic kidney disease: Secondary | ICD-10-CM | POA: Diagnosis not present

## 2016-03-16 DIAGNOSIS — K649 Unspecified hemorrhoids: Secondary | ICD-10-CM

## 2016-03-16 DIAGNOSIS — Z23 Encounter for immunization: Secondary | ICD-10-CM | POA: Diagnosis not present

## 2016-03-16 DIAGNOSIS — N186 End stage renal disease: Secondary | ICD-10-CM | POA: Diagnosis not present

## 2016-03-16 DIAGNOSIS — D631 Anemia in chronic kidney disease: Secondary | ICD-10-CM | POA: Diagnosis not present

## 2016-03-16 HISTORY — DX: Unspecified hemorrhoids: K64.9

## 2016-03-18 DIAGNOSIS — D631 Anemia in chronic kidney disease: Secondary | ICD-10-CM | POA: Diagnosis not present

## 2016-03-18 DIAGNOSIS — Z23 Encounter for immunization: Secondary | ICD-10-CM | POA: Diagnosis not present

## 2016-03-18 DIAGNOSIS — N186 End stage renal disease: Secondary | ICD-10-CM | POA: Diagnosis not present

## 2016-03-21 DIAGNOSIS — D631 Anemia in chronic kidney disease: Secondary | ICD-10-CM | POA: Diagnosis not present

## 2016-03-21 DIAGNOSIS — Z23 Encounter for immunization: Secondary | ICD-10-CM | POA: Diagnosis not present

## 2016-03-21 DIAGNOSIS — N186 End stage renal disease: Secondary | ICD-10-CM | POA: Diagnosis not present

## 2016-03-25 DIAGNOSIS — N186 End stage renal disease: Secondary | ICD-10-CM | POA: Diagnosis not present

## 2016-03-25 DIAGNOSIS — Z23 Encounter for immunization: Secondary | ICD-10-CM | POA: Diagnosis not present

## 2016-03-25 DIAGNOSIS — D631 Anemia in chronic kidney disease: Secondary | ICD-10-CM | POA: Diagnosis not present

## 2016-03-28 DIAGNOSIS — D631 Anemia in chronic kidney disease: Secondary | ICD-10-CM | POA: Diagnosis not present

## 2016-03-28 DIAGNOSIS — Z23 Encounter for immunization: Secondary | ICD-10-CM | POA: Diagnosis not present

## 2016-03-28 DIAGNOSIS — N186 End stage renal disease: Secondary | ICD-10-CM | POA: Diagnosis not present

## 2016-03-30 DIAGNOSIS — N186 End stage renal disease: Secondary | ICD-10-CM | POA: Diagnosis not present

## 2016-03-30 DIAGNOSIS — D631 Anemia in chronic kidney disease: Secondary | ICD-10-CM | POA: Diagnosis not present

## 2016-03-30 DIAGNOSIS — Z23 Encounter for immunization: Secondary | ICD-10-CM | POA: Diagnosis not present

## 2016-03-31 DIAGNOSIS — D631 Anemia in chronic kidney disease: Secondary | ICD-10-CM | POA: Diagnosis not present

## 2016-03-31 DIAGNOSIS — N186 End stage renal disease: Secondary | ICD-10-CM | POA: Diagnosis not present

## 2016-03-31 DIAGNOSIS — Z23 Encounter for immunization: Secondary | ICD-10-CM | POA: Diagnosis not present

## 2016-04-04 DIAGNOSIS — N186 End stage renal disease: Secondary | ICD-10-CM | POA: Diagnosis not present

## 2016-04-04 DIAGNOSIS — Z23 Encounter for immunization: Secondary | ICD-10-CM | POA: Diagnosis not present

## 2016-04-04 DIAGNOSIS — D631 Anemia in chronic kidney disease: Secondary | ICD-10-CM | POA: Diagnosis not present

## 2016-04-06 DIAGNOSIS — N186 End stage renal disease: Secondary | ICD-10-CM | POA: Diagnosis not present

## 2016-04-06 DIAGNOSIS — Z23 Encounter for immunization: Secondary | ICD-10-CM | POA: Diagnosis not present

## 2016-04-06 DIAGNOSIS — D631 Anemia in chronic kidney disease: Secondary | ICD-10-CM | POA: Diagnosis not present

## 2016-04-08 ENCOUNTER — Encounter (HOSPITAL_COMMUNITY): Payer: Self-pay

## 2016-04-08 ENCOUNTER — Inpatient Hospital Stay (HOSPITAL_COMMUNITY)
Admission: EM | Admit: 2016-04-08 | Discharge: 2016-04-12 | DRG: 393 | Disposition: A | Discharge status: Home / Self Care | Payer: Medicare Other | Attending: Student in an Organized Health Care Education/Training Program | Admitting: Student in an Organized Health Care Education/Training Program

## 2016-04-08 ENCOUNTER — Emergency Department (HOSPITAL_COMMUNITY): Payer: Medicare Other

## 2016-04-08 DIAGNOSIS — K5730 Diverticulosis of large intestine without perforation or abscess without bleeding: Secondary | ICD-10-CM | POA: Diagnosis present

## 2016-04-08 DIAGNOSIS — R1031 Right lower quadrant pain: Secondary | ICD-10-CM | POA: Diagnosis not present

## 2016-04-08 DIAGNOSIS — K529 Noninfective gastroenteritis and colitis, unspecified: Secondary | ICD-10-CM | POA: Diagnosis present

## 2016-04-08 DIAGNOSIS — R933 Abnormal findings on diagnostic imaging of other parts of digestive tract: Secondary | ICD-10-CM | POA: Diagnosis not present

## 2016-04-08 DIAGNOSIS — Z7682 Awaiting organ transplant status: Secondary | ICD-10-CM | POA: Diagnosis not present

## 2016-04-08 DIAGNOSIS — K5790 Diverticulosis of intestine, part unspecified, without perforation or abscess without bleeding: Secondary | ICD-10-CM | POA: Diagnosis not present

## 2016-04-08 DIAGNOSIS — T827XXA Infection and inflammatory reaction due to other cardiac and vascular devices, implants and grafts, initial encounter: Secondary | ICD-10-CM

## 2016-04-08 DIAGNOSIS — Z7952 Long term (current) use of systemic steroids: Secondary | ICD-10-CM | POA: Diagnosis not present

## 2016-04-08 DIAGNOSIS — G4733 Obstructive sleep apnea (adult) (pediatric): Secondary | ICD-10-CM | POA: Diagnosis present

## 2016-04-08 DIAGNOSIS — K6389 Other specified diseases of intestine: Secondary | ICD-10-CM | POA: Diagnosis not present

## 2016-04-08 DIAGNOSIS — N186 End stage renal disease: Secondary | ICD-10-CM | POA: Diagnosis not present

## 2016-04-08 DIAGNOSIS — D649 Anemia, unspecified: Secondary | ICD-10-CM | POA: Diagnosis not present

## 2016-04-08 DIAGNOSIS — R109 Unspecified abdominal pain: Secondary | ICD-10-CM

## 2016-04-08 DIAGNOSIS — D631 Anemia in chronic kidney disease: Secondary | ICD-10-CM | POA: Diagnosis present

## 2016-04-08 DIAGNOSIS — Z7982 Long term (current) use of aspirin: Secondary | ICD-10-CM | POA: Diagnosis not present

## 2016-04-08 DIAGNOSIS — Z6824 Body mass index (BMI) 24.0-24.9, adult: Secondary | ICD-10-CM | POA: Diagnosis not present

## 2016-04-08 DIAGNOSIS — I120 Hypertensive chronic kidney disease with stage 5 chronic kidney disease or end stage renal disease: Secondary | ICD-10-CM | POA: Diagnosis present

## 2016-04-08 DIAGNOSIS — Z992 Dependence on renal dialysis: Secondary | ICD-10-CM

## 2016-04-08 DIAGNOSIS — J90 Pleural effusion, not elsewhere classified: Secondary | ICD-10-CM | POA: Diagnosis present

## 2016-04-08 DIAGNOSIS — Z940 Kidney transplant status: Secondary | ICD-10-CM

## 2016-04-08 DIAGNOSIS — I10 Essential (primary) hypertension: Secondary | ICD-10-CM

## 2016-04-08 DIAGNOSIS — K648 Other hemorrhoids: Principal | ICD-10-CM | POA: Diagnosis present

## 2016-04-08 DIAGNOSIS — R509 Fever, unspecified: Secondary | ICD-10-CM

## 2016-04-08 DIAGNOSIS — K921 Melena: Secondary | ICD-10-CM | POA: Diagnosis present

## 2016-04-08 DIAGNOSIS — Z8249 Family history of ischemic heart disease and other diseases of the circulatory system: Secondary | ICD-10-CM | POA: Diagnosis not present

## 2016-04-08 DIAGNOSIS — Z79899 Other long term (current) drug therapy: Secondary | ICD-10-CM | POA: Diagnosis not present

## 2016-04-08 DIAGNOSIS — K5140 Inflammatory polyps of colon without complications: Secondary | ICD-10-CM | POA: Diagnosis not present

## 2016-04-08 DIAGNOSIS — E785 Hyperlipidemia, unspecified: Secondary | ICD-10-CM | POA: Diagnosis present

## 2016-04-08 DIAGNOSIS — K649 Unspecified hemorrhoids: Secondary | ICD-10-CM | POA: Diagnosis not present

## 2016-04-08 DIAGNOSIS — T8611 Kidney transplant rejection: Secondary | ICD-10-CM

## 2016-04-08 HISTORY — DX: Unspecified hemorrhoids: K64.9

## 2016-04-08 HISTORY — DX: Diverticulosis of intestine, part unspecified, without perforation or abscess without bleeding: K57.90

## 2016-04-08 LAB — COMPREHENSIVE METABOLIC PANEL
ALK PHOS: 73 U/L (ref 38–126)
ALT: 8 U/L — AB (ref 17–63)
AST: 15 U/L (ref 15–41)
Albumin: 2.6 g/dL — ABNORMAL LOW (ref 3.5–5.0)
Anion gap: 10 (ref 5–15)
BILIRUBIN TOTAL: 0.4 mg/dL (ref 0.3–1.2)
BUN: 23 mg/dL — ABNORMAL HIGH (ref 6–20)
CALCIUM: 9.1 mg/dL (ref 8.9–10.3)
CO2: 24 mmol/L (ref 22–32)
CREATININE: 9.88 mg/dL — AB (ref 0.61–1.24)
Chloride: 99 mmol/L — ABNORMAL LOW (ref 101–111)
GFR, EST AFRICAN AMERICAN: 6 mL/min — AB (ref >60)
GFR, EST NON AFRICAN AMERICAN: 5 mL/min — AB (ref >60)
Glucose, Bld: 84 mg/dL (ref 65–99)
Potassium: 4.8 mmol/L (ref 3.5–5.1)
Sodium: 133 mmol/L — ABNORMAL LOW (ref 135–145)
TOTAL PROTEIN: 7.5 g/dL (ref 6.5–8.1)

## 2016-04-08 LAB — CBC
HCT: 32.9 % — ABNORMAL LOW (ref 39.0–52.0)
Hemoglobin: 10 g/dL — ABNORMAL LOW (ref 13.0–17.0)
MCH: 23.1 pg — AB (ref 26.0–34.0)
MCHC: 30.4 g/dL (ref 30.0–36.0)
MCV: 76.2 fL — ABNORMAL LOW (ref 78.0–100.0)
PLATELETS: 213 10*3/uL (ref 150–400)
RBC: 4.32 MIL/uL (ref 4.22–5.81)
RDW: 24.4 % — ABNORMAL HIGH (ref 11.5–15.5)
WBC: 3.6 10*3/uL — AB (ref 4.0–10.5)

## 2016-04-08 LAB — TYPE AND SCREEN
ABO/RH(D): A POS
ANTIBODY SCREEN: NEGATIVE

## 2016-04-08 LAB — I-STAT CG4 LACTIC ACID, ED
LACTIC ACID, VENOUS: 0.86 mmol/L (ref 0.5–2.0)
Lactic Acid, Venous: 0.55 mmol/L (ref 0.5–2.0)

## 2016-04-08 MED ORDER — TRAZODONE HCL 50 MG PO TABS
50.0000 mg | ORAL_TABLET | Freq: Every day | ORAL | Status: DC
  Administered 2016-04-08 – 2016-04-11 (×4): 50 mg via ORAL
  Filled 2016-04-08 (×4): qty 1

## 2016-04-08 MED ORDER — CINACALCET HCL 30 MG PO TABS
30.0000 mg | ORAL_TABLET | Freq: Every day | ORAL | Status: DC
  Filled 2016-04-08 (×2): qty 1

## 2016-04-08 MED ORDER — ASPIRIN EC 81 MG PO TBEC
81.0000 mg | DELAYED_RELEASE_TABLET | Freq: Every day | ORAL | Status: DC
  Administered 2016-04-08 – 2016-04-11 (×4): 81 mg via ORAL
  Filled 2016-04-08 (×4): qty 1

## 2016-04-08 MED ORDER — HYDRALAZINE HCL 50 MG PO TABS
100.0000 mg | ORAL_TABLET | Freq: Two times a day (BID) | ORAL | Status: DC
  Administered 2016-04-08 – 2016-04-12 (×9): 100 mg via ORAL
  Filled 2016-04-08 (×2): qty 2
  Filled 2016-04-08: qty 4
  Filled 2016-04-08 (×7): qty 2

## 2016-04-08 MED ORDER — HEPARIN SODIUM (PORCINE) 1000 UNIT/ML DIALYSIS: 1000.0000 [IU] | INTRAMUSCULAR | Status: DC | PRN

## 2016-04-08 MED ORDER — SODIUM CHLORIDE 0.9 % IV SOLN: 100.0000 mL | INTRAVENOUS | Status: DC | PRN

## 2016-04-08 MED ORDER — CALCIUM CARBONATE-VITAMIN D 500-200 MG-UNIT PO TABS
1.0000 | ORAL_TABLET | Freq: Every evening | ORAL | Status: DC
  Administered 2016-04-09 – 2016-04-10 (×2): 1 via ORAL
  Filled 2016-04-08 (×2): qty 1

## 2016-04-08 MED ORDER — CARVEDILOL 25 MG PO TABS
25.0000 mg | ORAL_TABLET | Freq: Two times a day (BID) | ORAL | Status: DC
  Administered 2016-04-08 – 2016-04-12 (×9): 25 mg via ORAL
  Filled 2016-04-08: qty 2
  Filled 2016-04-08 (×8): qty 1

## 2016-04-08 MED ORDER — SEVELAMER CARBONATE 800 MG PO TABS
2400.0000 mg | ORAL_TABLET | Freq: Three times a day (TID) | ORAL | Status: DC
  Administered 2016-04-09 – 2016-04-12 (×8): 2400 mg via ORAL
  Filled 2016-04-08 (×10): qty 3

## 2016-04-08 MED ORDER — TRAMADOL HCL 50 MG PO TABS
50.0000 mg | ORAL_TABLET | Freq: Two times a day (BID) | ORAL | Status: DC | PRN
  Administered 2016-04-08: 50 mg via ORAL
  Filled 2016-04-08: qty 1

## 2016-04-08 MED ORDER — PENTAFLUOROPROP-TETRAFLUOROETH EX AERO: 1.0000 "application " | INHALATION_SPRAY | CUTANEOUS | Status: DC | PRN

## 2016-04-08 MED ORDER — LIDOCAINE-PRILOCAINE 2.5-2.5 % EX CREA: 1.0000 "application " | TOPICAL_CREAM | CUTANEOUS | Status: DC | PRN

## 2016-04-08 MED ORDER — CALCITRIOL 0.5 MCG PO CAPS
0.5000 ug | ORAL_CAPSULE | ORAL | Status: DC
  Administered 2016-04-11: 0.5 ug via ORAL
  Filled 2016-04-08 (×2): qty 1

## 2016-04-08 MED ORDER — AMLODIPINE BESYLATE 10 MG PO TABS
10.0000 mg | ORAL_TABLET | Freq: Every day | ORAL | Status: DC
  Administered 2016-04-08 – 2016-04-12 (×5): 10 mg via ORAL
  Filled 2016-04-08 (×4): qty 1
  Filled 2016-04-08: qty 2

## 2016-04-08 MED ORDER — ALTEPLASE 2 MG IJ SOLR: 2.0000 mg | Freq: Once | INTRAMUSCULAR | Status: DC | PRN

## 2016-04-08 MED ORDER — FERROUS SULFATE 325 (65 FE) MG PO TABS
325.0000 mg | ORAL_TABLET | Freq: Three times a day (TID) | ORAL | Status: DC
  Administered 2016-04-09 – 2016-04-10 (×5): 325 mg via ORAL
  Filled 2016-04-08 (×5): qty 1

## 2016-04-08 MED ORDER — LIDOCAINE HCL (PF) 1 % IJ SOLN: 5.0000 mL | INTRAMUSCULAR | Status: DC | PRN

## 2016-04-08 MED ORDER — LISINOPRIL 40 MG PO TABS
40.0000 mg | ORAL_TABLET | Freq: Every day | ORAL | Status: DC
  Filled 2016-04-08 (×2): qty 1

## 2016-04-08 MED ORDER — HEPARIN SODIUM (PORCINE) 1000 UNIT/ML DIALYSIS: 20.0000 [IU]/kg | INTRAMUSCULAR | Status: DC | PRN

## 2016-04-08 MED ORDER — VANCOMYCIN HCL 10 G IV SOLR
1250.0000 mg | Freq: Once | INTRAVENOUS | Status: AC
  Administered 2016-04-08: 1250 mg via INTRAVENOUS
  Filled 2016-04-08: qty 1250

## 2016-04-08 MED ORDER — PIPERACILLIN-TAZOBACTAM IN DEX 2-0.25 GM/50ML IV SOLN
2.2500 g | Freq: Three times a day (TID) | INTRAVENOUS | Status: DC
  Administered 2016-04-08 – 2016-04-10 (×6): 2.25 g via INTRAVENOUS
  Filled 2016-04-08 (×8): qty 50

## 2016-04-08 NOTE — Progress Notes (Signed)
Pharmacy Antibiotic Note  Jonathon Conley is a 50 y.o. male admitted on 04/08/2016 with sepsis Pharmacy has been consulted for Vancomycin and zosyn dosing. WBC 3.6, Tmax 100.4, LA 0.86. ESRD on HD - last session Thursday 6/22. Pharmacy consulted to dose vanc and zosyn for presumed sepsis.   Plan:  Vancomycin 1244m x1  F/U HD plans and further abx dosing  Zosyn 2.25g IV Q8h  F/U LOT, cultures  Height: 5' 10"  (177.8 cm) Weight: 167 lb 8.8 oz (76 kg) IBW/kg (Calculated) : 73  Temp (24hrs), Avg:100.4 F (38 C), Min:100.4 F (38 C), Max:100.4 F (38 C)   Recent Labs Lab 04/08/16 1114 04/08/16 1120  WBC 3.6*  --   CREATININE 9.88*  --   LATICACIDVEN  --  0.86    Estimated Creatinine Clearance: 9.3 mL/min (by C-G formula based on Cr of 9.88).    No Known Allergies  Antimicrobials this admission: 6/24: Vanc> 6/24 zosyn>>  Microbiology results: 6/24 BCx x2>>sent   Thank you for allowing pharmacy to be a part of this patient's care.  Clif Serio C. MLennox Grumbles PharmD Pharmacy Resident  Pager: [phone] 44266/24/2017 12:49 PM

## 2016-04-08 NOTE — Procedures (Signed)
On HD.  BP a lot better,  He is chilling. Stable hemodynamics.  Will adjust fluid removal as needed. Jumanah Hynson C

## 2016-04-08 NOTE — ED Notes (Signed)
MD at bedside. 

## 2016-04-08 NOTE — ED Provider Notes (Signed)
CSN: [id number]     Arrival date & time 04/08/16  1050 History   First MD Initiated Contact with Patient 04/08/16 1127     No chief complaint on file.    The history is provided by the patient. No language interpreter was used.   Jonathon Conley is a 50 y.o. male who presents to the Emergency Department complaining of fever, rectal bleeding.  He has a history of end-stage renal disease on hemodialysis. He presents for abdominal discomfort, rectal bleeding since last night. He reports 3 episodes of bright red blood in stool since last night. He has nausea, no vomiting. He had a temperature of 100.9 today. He has diffuse backaches. He receives dialysis Tuesday, Thursday, Saturday via a vascular catheter in his anterior chest wall. Catheter has been in place since October. His last dialysis session was on Thursday. He had a problem with infection in the catheter back in October when this place but none since then. Prior to October he had a renal transplant that failed. He takes a baby aspirin daily. He has a history of bloody stools in the past with his transplant, but none recently.  Past Medical History  Diagnosis Date  . Hypertension   . Hyperlipidemia   . Chronic kidney disease   . OSA (obstructive sleep apnea) 03/27/2013  . Blood transfusion without reported diagnosis    Past Surgical History  Procedure Laterality Date  . Kidney transplant     Family History  Problem Relation Age of Onset  . Heart disease Mother   . Cancer Mother     ovarian  . Cancer Maternal Grandmother   . Cancer Maternal Grandfather   . Asthma Son   . Asthma Son    Social History  Substance Use Topics  . Smoking status: Never Smoker   . Smokeless tobacco: Never Used  . Alcohol Use: No    Review of Systems  All other systems reviewed and are negative.     Allergies  Review of patient's allergies indicates no known allergies.  Home Medications   Prior to Admission medications   Medication Sig Start  Date End Date Taking? Authorizing Provider  amLODipine (NORVASC) 10 MG tablet Take 10 mg by mouth daily.    Historical Provider, MD  aspirin EC 81 MG tablet Take 81 mg by mouth at bedtime.    Historical Provider, MD  calcitRIOL (ROCALTROL) 0.5 MCG capsule Take 1 capsule (0.5 mcg total) by mouth Every Tuesday,Thursday,and Saturday with dialysis. 09/24/15   Orson Eva, MD  Calcium Carbonate-Vitamin D (CALCIUM 600+D) 600-400 MG-UNIT tablet Take 1 tablet by mouth every evening.    Historical Provider, MD  carvedilol (COREG) 25 MG tablet Take 25 mg by mouth 2 (two) times daily.     Historical Provider, MD  cinacalcet (SENSIPAR) 30 MG tablet Take 30 mg by mouth daily. Reported on 02/08/2016    Historical Provider, MD  ferrous sulfate 325 (65 FE) MG tablet Take 325 mg by mouth 3 (three) times daily with meals.     Historical Provider, MD  hydrALAZINE (APRESOLINE) 50 MG tablet Take 100 mg by mouth 2 (two) times daily.     Historical Provider, MD  lisinopril (PRINIVIL,ZESTRIL) 40 MG tablet Take 1 tablet (40 mg total) by mouth at bedtime. 01/04/16   Tresa Garter, MD  predniSONE (DELTASONE) 5 MG tablet Take 5 mg by mouth daily with breakfast.    Historical Provider, MD  sevelamer carbonate (RENVELA) 800 MG tablet Take 2,400 mg by  mouth 3 (three) times daily with meals. Reported on 02/08/2016    Historical Provider, MD  simvastatin (ZOCOR) 10 MG tablet Take 1 tablet by mouth. 12/14/15   Historical Provider, MD  simvastatin (ZOCOR) 5 MG tablet Take 1 tablet (5 mg total) by mouth at bedtime. 02/17/15   Shanker Kristeen Mans, MD  traZODone (DESYREL) 50 MG tablet Take 1 tablet (50 mg total) by mouth at bedtime. Patient not taking: Reported on 02/01/2016 01/04/16 02/03/16  Tresa Garter, MD   BP 192/109 mmHg  Pulse 103  Temp(Src) 100.4 F (38 C) (Oral)  Resp 18  Ht 5' 10"  (1.778 m)  Wt 167 lb 8.8 oz (76 kg)  BMI 24.04 kg/m2  SpO2 100% Physical Exam  Constitutional: He is oriented to person, place, and time.  He appears well-developed and well-nourished.  HENT:  Head: Normocephalic and atraumatic.  Cardiovascular: Regular rhythm.   No murmur heard. Tachycardic  Pulmonary/Chest: Effort normal. No respiratory distress.  Crackles in the right lung base.  Abdominal: Soft. There is no rebound and no guarding.  Mild diffuse abdominal tenderness  Genitourinary:  nontender rectal exam.  Small amount of blood and mucous.   Musculoskeletal: He exhibits no edema or tenderness.  Neurological: He is alert and oriented to person, place, and time.  Skin: Skin is warm and dry.  Psychiatric: He has a normal mood and affect. His behavior is normal.  Nursing note and vitals reviewed.   ED Course  Procedures (including critical care time) Labs Review Labs Reviewed  COMPREHENSIVE METABOLIC PANEL - Abnormal; Notable for the following:    Sodium 133 (*)    Chloride 99 (*)    BUN 23 (*)    Creatinine, Ser 9.88 (*)    Albumin 2.6 (*)    ALT 8 (*)    GFR calc non Af Amer 5 (*)    GFR calc Af Amer 6 (*)    All other components within normal limits  CBC - Abnormal; Notable for the following:    WBC 3.6 (*)    Hemoglobin 10.0 (*)    HCT 32.9 (*)    MCV 76.2 (*)    MCH 23.1 (*)    RDW 24.4 (*)    All other components within normal limits  RENAL FUNCTION PANEL - Abnormal; Notable for the following:    Chloride 99 (*)    Creatinine, Ser 6.14 (*)    Phosphorus 2.3 (*)    Albumin 2.7 (*)    GFR calc non Af Amer 10 (*)    GFR calc Af Amer 11 (*)    All other components within normal limits  CBC - Abnormal; Notable for the following:    Hemoglobin 9.8 (*)    HCT 32.2 (*)    MCV 76.3 (*)    MCH 23.2 (*)    RDW 24.9 (*)    All other components within normal limits  CULTURE, BLOOD (ROUTINE X 2)  CULTURE, BLOOD (ROUTINE X 2)  C DIFFICILE QUICK SCREEN W PCR REFLEX  URINE CULTURE  MRSA PCR SCREENING  URINALYSIS W MICROSCOPIC (NOT AT [facility])  I-STAT CG4 LACTIC ACID, ED  I-STAT CG4 LACTIC ACID, ED   POC OCCULT BLOOD, ED  TYPE AND SCREEN    Imaging Review Dg Chest 2 View  04/08/2016  CLINICAL DATA:  Blood in stool.  Fever of 109 for 1 day.  Weakness. EXAM: CHEST  2 VIEW COMPARISON:  Chest x-ray dated 12/24/2015. FINDINGS: Right-sided dialysis catheter appears stable  in position. Cardiomediastinal silhouette is stable. Heart size is normal. The moderate-sized right pleural effusion appears stable, with probable underlying atelectasis. Left lung remains clear. Osseous structures about the chest are unremarkable. IMPRESSION: No significant change compared to chest x-ray of 12/24/2015. Moderate-sized right pleural effusion appears stable, with probable chronic underlying atelectasis. No new lung findings. Electronically Signed   By: Franki Cabot M.D.   On: 04/08/2016 12:26   US Renal  04/08/2016  CLINICAL DATA:  Abdominal pain, fatigue and chronic kidney disease with history of renal transplantation. EXAM: RENAL / URINARY TRACT ULTRASOUND COMPLETE COMPARISON:  02/14/2015 FINDINGS: Right Kidney: Length: 6.4 cm. Diffusely atrophic native right kidney. No hydronephrosis. No solid masses. Left Kidney: Length: 5.7 cm. Diffusely atrophic left kidney. No masses or hydronephrosis. Transplant kidney (right lower quadrant): Length of 11.6 cm. Echogenicity similar to prior ultrasound with suggestion of slightly increased cortical echogenicity compared to the prior ultrasound. No evidence of hydronephrosis. Formal vascular evaluation of the transplant was not performed. There is blood flow present by color Doppler in the transplant kidney. No abnormal surrounding fluid collections. Bladder: Appears normal for degree of bladder distention. IMPRESSION: The renal transplant no evidence of hydronephrosis. Suggestion of slightly increased cortical echogenicity of the transplanted kidney compared to the prior ultrasound which may implicate some degree of underlying chronic rejection. Electronically Signed   By: Aletta Edouard M.D.   On: 04/08/2016 14:23   I have personally reviewed and evaluated these images and lab results as part of my medical decision-making.   EKG Interpretation None      MDM   Final diagnoses:  Febrile illness, acute  Hematochezia    Pt with hx/o ESRD on HD here with fevers, malaise, hematochezia. Febrile in ED to 101.4.  He has a small amount of gross blood on rectal exam with no evidence of major hemorrhage.  Abdominal examination is benign.  Treated with broad spectrum abx in setting of fever, ESRD with vascular catheter in place.  Medicine consulted for admission for further treatment.     Quintella Reichert, MD 04/09/16 [phone]

## 2016-04-08 NOTE — Consult Note (Signed)
[name], a 50 year old male with a history of ESRD.  He is s/p DDKT in 2001 at [facility].  He was followed by me at [facility] until he needed dialysis and refused AV access placement and did not follow up timely with our practice and was discharged.  He started hemodialysis around October 2016 under care of Dr. Smith Mince at [facility] in [place].  He is being evaluated for a repeat kidney transplant at [facility].  He reports he no longer takes anti-rejection medication and took his last dose of prednisone in February, 2016.       He is currently dialyzed on Tuesday, Thursday, and Saturday at the [facility].  He does not have an AV access and uses a right IJ TDC.  Last night he noted onset of severe myalgias and had a fever.  The catheter is not sore.  No other constitutional symptoms.  He also noted some BRPBR last night and this AM. He reports a prior episode of blood per rectum before his kidney transplant. He presented to the [facility] ED today. Temp was 100.4 BP up to 207/111.  Past Medical History  Diagnosis Date  . Hypertension   . Hyperlipidemia   . Chronic kidney disease   . OSA (obstructive sleep apnea) 03/27/2013  . Blood transfusion without reported diagnosis    Past Surgical History  Procedure Laterality Date  . Kidney transplant     Social History:  reports that he has never smoked. He has never used smokeless tobacco. He reports that he does not drink alcohol or use illicit drugs. Allergies: No Known Allergies Family History  Problem Relation Age of Onset  . Heart disease Mother   . Cancer Mother     ovarian  . Cancer Maternal Grandmother   . Cancer Maternal Grandfather   . Asthma Son   . Asthma Son     Medications:  Scheduled: . amLODipine  10 mg Oral Daily  . aspirin EC  81 mg Oral QHS  . [START ON 04/11/2016] calcitRIOL  0.5 mcg Oral Q T,Th,Sa-HD  . calcium-vitamin D  1 tablet Oral QPM  . carvedilol  25 mg Oral BID  . cinacalcet  30 mg Oral  Daily  . [START ON 04/09/2016] ferrous sulfate  325 mg Oral TID WC  . hydrALAZINE  100 mg Oral BID  . lisinopril  40 mg Oral QHS  . piperacillin-tazobactam (ZOSYN)  IV  2.25 g Intravenous Q8H  . sevelamer carbonate  2,400 mg Oral TID WC  . traZODone  50 mg Oral QHS    ROS: as per HPI  Blood pressure 156/86, pulse 98, temperature 100.3 F (37.9 C), temperature source Oral, resp. rate 18, height 5' 10" (1.778 m), weight 75.451 kg (166 lb 5.4 oz), SpO2 100 %.  General appearance: alert and cooperative Head: Normocephalic, without obvious abnormality, atraumatic bald, acne post neck Eyes: negative Ears: normal TM's and external ear canals both ears Nose: no discharge Throat: lips, mucosa, and tongue normal; teeth and gums normal Chest wall: no tenderness catheter right chest w nontender GI: tender renal allograft RLQ moderate to mild, no HS megaly Extremities: edema tr Skin: Skin color, texture, turgor normal. No rashes or lesions Neurologic: NF, alert and appropriate Results for orders placed or performed during the hospital encounter of 04/08/16 (from the past 48 hour(s))  Type and screen [place]     Status: None   Collection Time: 04/08/16 11:05 AM  Result  Value Ref Range   ABO/RH(D) A POS    Antibody Screen NEG    Sample Expiration 04/11/2016   Comprehensive metabolic panel     Status: Abnormal   Collection Time: 04/08/16 11:14 AM  Result Value Ref Range   Sodium 133 (L) 135 - 145 mmol/L   Potassium 4.8 3.5 - 5.1 mmol/L   Chloride 99 (L) 101 - 111 mmol/L   CO2 24 22 - 32 mmol/L   Glucose, Bld 84 65 - 99 mg/dL   BUN 23 (H) 6 - 20 mg/dL   Creatinine, Ser 9.88 (H) 0.61 - 1.24 mg/dL   Calcium 9.1 8.9 - 10.3 mg/dL   Total Protein 7.5 6.5 - 8.1 g/dL   Albumin 2.6 (L) 3.5 - 5.0 g/dL   AST 15 15 - 41 U/L   ALT 8 (L) 17 - 63 U/L   Alkaline Phosphatase 73 38 - 126 U/L   Total Bilirubin 0.4 0.3 - 1.2 mg/dL   GFR calc non Af Amer 5 (L) >60 mL/min   GFR calc Af  Amer 6 (L) >60 mL/min    Comment: (NOTE) The eGFR has been calculated using the CKD EPI equation. This calculation has not been validated in all clinical situations. eGFR's persistently <60 mL/min signify possible Chronic Kidney Disease.    Anion gap 10 5 - 15  CBC     Status: Abnormal   Collection Time: 04/08/16 11:14 AM  Result Value Ref Range   WBC 3.6 (L) 4.0 - 10.5 K/uL   RBC 4.32 4.22 - 5.81 MIL/uL   Hemoglobin 10.0 (L) 13.0 - 17.0 g/dL   HCT 32.9 (L) 39.0 - 52.0 %   MCV 76.2 (L) 78.0 - 100.0 fL   MCH 23.1 (L) 26.0 - 34.0 pg   MCHC 30.4 30.0 - 36.0 g/dL   RDW 24.4 (H) 11.5 - 15.5 %   Platelets 213 150 - 400 K/uL  I-Stat CG4 Lactic Acid, ED     Status: None   Collection Time: 04/08/16 11:20 AM  Result Value Ref Range   Lactic Acid, Venous 0.86 0.5 - 2.0 mmol/L  I-Stat CG4 Lactic Acid, ED     Status: None   Collection Time: 04/08/16  3:35 PM  Result Value Ref Range   Lactic Acid, Venous 0.55 0.5 - 2.0 mmol/L   Dg Chest 2 View  04/08/2016  CLINICAL DATA:  Blood in stool.  Fever of 109 for 1 day.  Weakness. EXAM: CHEST  2 VIEW COMPARISON:  Chest x-ray dated 12/24/2015. FINDINGS: Right-sided dialysis catheter appears stable in position. Cardiomediastinal silhouette is stable. Heart size is normal. The moderate-sized right pleural effusion appears stable, with probable underlying atelectasis. Left lung remains clear. Osseous structures about the chest are unremarkable. IMPRESSION: No significant change compared to chest x-ray of 12/24/2015. Moderate-sized right pleural effusion appears stable, with probable chronic underlying atelectasis. No new lung findings. Electronically Signed   By: Franki Cabot M.D.   On: 04/08/2016 12:26   US Renal  04/08/2016  CLINICAL DATA:  Abdominal pain, fatigue and chronic kidney disease with history of renal transplantation. EXAM: RENAL / URINARY TRACT ULTRASOUND COMPLETE COMPARISON:  02/14/2015 FINDINGS: Right Kidney: Length: 6.4 cm. Diffusely  atrophic native right kidney. No hydronephrosis. No solid masses. Left Kidney: Length: 5.7 cm. Diffusely atrophic left kidney. No masses or hydronephrosis. Transplant kidney (right lower quadrant): Length of 11.6 cm. Echogenicity similar to prior ultrasound with suggestion of slightly increased cortical echogenicity compared to the prior ultrasound. No evidence  of hydronephrosis. Formal vascular evaluation of the transplant was not performed. There is blood flow present by color Doppler in the transplant kidney. No abnormal surrounding fluid collections. Bladder: Appears normal for degree of bladder distention. IMPRESSION: The renal transplant no evidence of hydronephrosis. Suggestion of slightly increased cortical echogenicity of the transplanted kidney compared to the prior ultrasound which may implicate some degree of underlying chronic rejection. Electronically Signed   By: Aletta Edouard M.D.   On: 04/08/2016 14:23    Assessment:  1 Acute renal allograft rejection, suspected 2 R/O infection (HD catheter, etc) 3 ESRD on HD TTS 4 HD catheter 5 Failed Renal Transplant Plan: 1 Pan-culture 2 Urine analysis 3 If cultures negative, steroids and refer for transplant nephrectomy 4 Renal allograft ultrasound  , C 04/08/2016, 4:36 PM

## 2016-04-08 NOTE — ED Notes (Addendum)
Patient here with some mild abdominal cramping, fatigue, frank red blood in stool x 3 episodes. No nausea on arrival. Chills with same. Due for dialysis today. fever at hone 101

## 2016-04-08 NOTE — ED Notes (Signed)
Patient transported to X-ray 

## 2016-04-08 NOTE — H&P (Signed)
Date: 04/08/2016               Patient Name:  Jonathon Conley MRN: [id number]  DOB: June 26, 1966 Age / Sex: 50 y.o., male   PCP: Tresa Garter, MD         Medical Service: Internal Medicine Teaching Service         Attending Physician: Dr. Axel Filler, MD    First Contact: Dr. Benjamine Mola Pager: [phone]  Second Contact: Dr. Hulen Luster Pager: [phone]       After Hours (After 5p/  First Contact Pager: [phone]  weekends / holidays): Second Contact Pager: [phone]   Chief Complaint: Hematochezia, fever  History of Present Illness: 50 y/o man with PMHx of renal transplant s/p chronic allograft rejection 2016 now on HD, HTN, OSA, recurrent R pleural effusion presents to the ED today after 3 episodes of bright red blood in his bowel movements last night and this morning. These were painless. He does not recall having blood in his stool any other times except shortly after his renal transplant. He also feels that he has had sweating and chills since yesterday that increased this morning. He felt generalized weakness and presented to the ED this morning instead of attending his regular hemodialysis today. He does endorse mild tenderness over his RLQ without radiation otherwise no abdominal pain. He denies any preceding diarrhea or nausea. He has a nonproductive cough that is chronic but no congestion or chest pain. He feels he has a headache without vision changes. He also feels there is more swelling in his hands and knees than usual but is not significantly above his usual weight.  After arrival in the ED he was noted to have fever of 100.4 WBC, of 3.6, and Hgb of 10.0. He did not take any of his antihypertensives today and did not attend HD so is hypertensive to 190s-200s SBP.  He attends HD on TTS in [facility] through a R chest vasocath ever since failure of his renal transplant last year. He reports completing dialysis on Thursday without difficulty. He is on the transplant list at [facility].  Meds: Current Facility-Administered Medications  Medication Dose Route Frequency Provider Last Rate Last Dose  . amLODipine (NORVASC) tablet 10 mg  10 mg Oral Daily Norman Herrlich, MD   10 mg at 04/08/16 1524  . aspirin EC tablet 81 mg  81 mg Oral QHS Norman Herrlich, MD      . Derrill Memo ON 04/11/2016] calcitRIOL (ROCALTROL) capsule 0.5 mcg  0.5 mcg Oral Q T,Th,Sa-HD Norman Herrlich, MD      . calcium-vitamin D (OSCAL WITH D) 500-200 MG-UNIT per tablet 1 tablet  1 tablet Oral QPM Norman Herrlich, MD      . carvedilol (COREG) tablet 25 mg  25 mg Oral BID Norman Herrlich, MD   25 mg at 04/08/16 1525  . cinacalcet (SENSIPAR) tablet 30 mg  30 mg Oral Daily Norman Herrlich, MD   Stopped at 04/08/16 1521  . [START ON 04/09/2016] ferrous sulfate tablet 325 mg  325 mg Oral TID WC Norman Herrlich, MD      . hydrALAZINE (APRESOLINE) tablet 100 mg  100 mg Oral BID Norman Herrlich, MD   100 mg at 04/08/16 1525  . lisinopril (PRINIVIL,ZESTRIL) tablet 40 mg  40 mg Oral QHS Norman Herrlich, MD      . piperacillin-tazobactam (ZOSYN) IVPB 2.25 g  2.25 g Intravenous Q8H Meagan  Ival Bible, RPH 100 mL/hr at 04/08/16 1524 2.25 g at 04/08/16 1524  . sevelamer carbonate (RENVELA) tablet 2,400 mg  2,400 mg Oral TID WC Norman Herrlich, MD      . traZODone (DESYREL) tablet 50 mg  50 mg Oral QHS Norman Herrlich, MD        Allergies: Allergies as of 04/08/2016  . (No Known Allergies)   Past Medical History  Diagnosis Date  . Hypertension   . Hyperlipidemia   . Chronic kidney disease   . OSA (obstructive sleep apnea) 03/27/2013  . Blood transfusion without reported diagnosis    Past Surgical History  Procedure Laterality Date  . Kidney transplant     Family History  Problem Relation Age of Onset  . Heart disease Mother   . Cancer Mother     ovarian  . Cancer Maternal Grandmother   . Cancer Maternal Grandfather   . Asthma Son   . Asthma Son    Social History   Social History  . Marital  Status: Single    Spouse Name: N/A  . Number of Children: N/A  . Years of Education: N/A   Occupational History  . Not on file.   Social History Main Topics  . Smoking status: Never Smoker   . Smokeless tobacco: Never Used  . Alcohol Use: No  . Drug Use: No  . Sexual Activity: Not on file   Other Topics Concern  . Not on file   Social History Narrative    Review of Systems: Review of Systems  Constitutional: Positive for fever, chills and malaise/fatigue.  HENT: Negative for congestion.   Eyes: Negative for blurred vision.  Respiratory: Positive for cough. Negative for sputum production and shortness of breath.   Cardiovascular: Negative for chest pain and leg swelling.  Gastrointestinal: Positive for abdominal pain and blood in stool. Negative for nausea.  Genitourinary: Negative for frequency.  Musculoskeletal: Negative for back pain.  Skin: Negative for rash.  Neurological: Positive for headaches. Negative for dizziness.  Endo/Heme/Allergies: Negative for environmental allergies.  Psychiatric/Behavioral: The patient is not nervous/anxious.     Physical Exam: Blood pressure 156/86, pulse 98, temperature 100.3 F (37.9 C), temperature source Oral, resp. rate 18, height 5' 10"  (1.778 m), weight 75.451 kg (166 lb 5.4 oz), SpO2 100 %. GENERAL- alert, co-operative, NAD HEENT- PERRL, EOMI, oral mucosa appears moist, no cervical LN enlargement CARDIAC- RRR, normal s1 s2 RESP- Diminished air movement over right lower lung field with crackles ABDOMEN- Soft, mild tenderness over RLQ with very well healed surgical scar present BACK- No paraspinal tenderness, no CVA tenderness EXTREMITIES- symmetric, no pedal edema SKIN- Warm, dry, No rash or lesion PSYCH- Normal mood and affect, appropriate thought content and speech.  Lab results: Basic Metabolic Panel:  Recent Labs  04/08/16 1114  NA 133*  K 4.8  CL 99*  CO2 24  GLUCOSE 84  BUN 23*  CREATININE 9.88*  CALCIUM  9.1   Liver Function Tests:  Recent Labs  04/08/16 1114  AST 15  ALT 8*  ALKPHOS 73  BILITOT 0.4  PROT 7.5  ALBUMIN 2.6*   CBC:  Recent Labs  04/08/16 1114  WBC 3.6*  HGB 10.0*  HCT 32.9*  MCV 76.2*  PLT 213    Imaging results:  Dg Chest 2 View  04/08/2016  CLINICAL DATA:  Blood in stool.  Fever of 109 for 1 day.  Weakness. EXAM: CHEST  2 VIEW COMPARISON:  Chest x-ray dated 12/24/2015.  FINDINGS: Right-sided dialysis catheter appears stable in position. Cardiomediastinal silhouette is stable. Heart size is normal. The moderate-sized right pleural effusion appears stable, with probable underlying atelectasis. Left lung remains clear. Osseous structures about the chest are unremarkable. IMPRESSION: No significant change compared to chest x-ray of 12/24/2015. Moderate-sized right pleural effusion appears stable, with probable chronic underlying atelectasis. No new lung findings. Electronically Signed   By: Franki Cabot M.D.   On: 04/08/2016 12:26   US Renal  04/08/2016  CLINICAL DATA:  Abdominal pain, fatigue and chronic kidney disease with history of renal transplantation. EXAM: RENAL / URINARY TRACT ULTRASOUND COMPLETE COMPARISON:  02/14/2015 FINDINGS: Right Kidney: Length: 6.4 cm. Diffusely atrophic native right kidney. No hydronephrosis. No solid masses. Left Kidney: Length: 5.7 cm. Diffusely atrophic left kidney. No masses or hydronephrosis. Transplant kidney (right lower quadrant): Length of 11.6 cm. Echogenicity similar to prior ultrasound with suggestion of slightly increased cortical echogenicity compared to the prior ultrasound. No evidence of hydronephrosis. Formal vascular evaluation of the transplant was not performed. There is blood flow present by color Doppler in the transplant kidney. No abnormal surrounding fluid collections. Bladder: Appears normal for degree of bladder distention. IMPRESSION: The renal transplant no evidence of hydronephrosis. Suggestion of slightly  increased cortical echogenicity of the transplanted kidney compared to the prior ultrasound which may implicate some degree of underlying chronic rejection. Electronically Signed   By: Aletta Edouard M.D.   On: 04/08/2016 14:23    Assessment & Plan by Problem: Fever: Infectious picture with low grade fever and WBC count of 3.6 with generalized malaise. Given his ESRD may explain atypical presentation. He has a long indwelling vasocath as his dialysis access that is a high risk of infection. No local inflammation present. He is tachycardic but very hypertensive so unclear if full sepsis presentation. May also be related to a enteritis/colitis picture that would also explain his hematochezia. -F/U blood Cx x2 -Start empiric vanc/zosyn IV 6/24>>> -If culture positive will need line holiday and clearance -Urine Cx -Cardiac monitoring  Hematochezia: 3 episodes of painless BRBPR overnight before presentation.  No obvious external hemorrhoids on exam. He has not had similar bleeding episodes lately stating last was shortly after his renal transplant. Multiple healthcare exposures and previous hospitalization with C. Dill colitis in 07/2015. - Hold pharmacologic VTE ppx - C. Diff. PCR, enteric precautions -repeat AM CBC  ESRD on HD: S/p renal transplant for glomerulonephritis ~15 years ago with acute on chronic failure last year. Currently he has only a vasocath for access. He has numerous failed access sites sparing mostly just his LUE, but is on the transplant list a [facility] and does not have a specific plan yet for vascular access. TTS schedule. -Nephrology consulted for HD, recommendations appreciated -Renal function panel in AM  Hypertension: Severe hypertension to 200s/110s currently exacerbated by not receiving hemodialysis today as is routine plus no home antihypertensives on board. E has a mild headache otherwise no clear symptoms or organ dysfunction. Will resume home medications. -Amlodipine  32m -Coreg 276mBID -Hydralazine 10051mID -lisinopril 85m19mnemia of chronic disease: Hgb 10.0 today stable from 3 months ago. ESRD management as above. -Continue errous sulfate 325mg23mronic R pleural effusion: Redemonstration of pleural unchanged compared to previous in March. Fluid reaccumulation was reported at 2 weeks after drainage of an effusion with elevated LDH of 158 at that time and empiric Rx with levaquin. This is almost certainly the same process and may consider drainage again if other  infectious source not identified. Long term may require pleurodesis to reduce recurrence.  FULL CODE Diet: Renal VTE ppx: SCDs  Dispo: Disposition is deferred at this time, awaiting improvement of current medical problems. Anticipated discharge in approximately 1-3 day(s).   The patient does have a current PCP (Tresa Garter, MD) and does need an [facility] hospital follow-up appointment after discharge.  The patient does not know have transportation limitations that hinder transportation to clinic appointments.  Signed: Collier Salina, MD 04/08/2016, 4:32 PM

## 2016-04-08 NOTE — ED Notes (Signed)
Peggy, RN given report, made aware that patient just received PO blood pressure meds, Peggy advised this RN it is okay to go ahead and bring patient up to floor.

## 2016-04-09 ENCOUNTER — Inpatient Hospital Stay (HOSPITAL_COMMUNITY): Payer: Medicare Other

## 2016-04-09 ENCOUNTER — Encounter (HOSPITAL_COMMUNITY): Payer: Self-pay | Admitting: Radiology

## 2016-04-09 DIAGNOSIS — J90 Pleural effusion, not elsewhere classified: Secondary | ICD-10-CM

## 2016-04-09 DIAGNOSIS — Z992 Dependence on renal dialysis: Secondary | ICD-10-CM

## 2016-04-09 DIAGNOSIS — I120 Hypertensive chronic kidney disease with stage 5 chronic kidney disease or end stage renal disease: Secondary | ICD-10-CM

## 2016-04-09 DIAGNOSIS — D631 Anemia in chronic kidney disease: Secondary | ICD-10-CM

## 2016-04-09 DIAGNOSIS — N186 End stage renal disease: Secondary | ICD-10-CM

## 2016-04-09 DIAGNOSIS — R509 Fever, unspecified: Secondary | ICD-10-CM

## 2016-04-09 LAB — CBC
HEMATOCRIT: 32.2 % — AB (ref 39.0–52.0)
HEMOGLOBIN: 9.8 g/dL — AB (ref 13.0–17.0)
MCH: 23.2 pg — AB (ref 26.0–34.0)
MCHC: 30.4 g/dL (ref 30.0–36.0)
MCV: 76.3 fL — AB (ref 78.0–100.0)
PLATELETS: 192 10*3/uL (ref 150–400)
RBC: 4.22 MIL/uL (ref 4.22–5.81)
RDW: 24.9 % — ABNORMAL HIGH (ref 11.5–15.5)
WBC: 4.4 10*3/uL (ref 4.0–10.5)

## 2016-04-09 LAB — RENAL FUNCTION PANEL
ALBUMIN: 2.5 g/dL — AB (ref 3.5–5.0)
ANION GAP: 11 (ref 5–15)
ANION GAP: 6 (ref 5–15)
Albumin: 2.7 g/dL — ABNORMAL LOW (ref 3.5–5.0)
BUN: 13 mg/dL (ref 6–20)
BUN: 20 mg/dL (ref 6–20)
CALCIUM: 8.6 mg/dL — AB (ref 8.9–10.3)
CHLORIDE: 99 mmol/L — AB (ref 101–111)
CO2: 25 mmol/L (ref 22–32)
CO2: 27 mmol/L (ref 22–32)
Calcium: 9 mg/dL (ref 8.9–10.3)
Chloride: 97 mmol/L — ABNORMAL LOW (ref 101–111)
Creatinine, Ser: 6.14 mg/dL — ABNORMAL HIGH (ref 0.61–1.24)
Creatinine, Ser: 7.42 mg/dL — ABNORMAL HIGH (ref 0.61–1.24)
GFR, EST AFRICAN AMERICAN: 11 mL/min — AB (ref >60)
GFR, EST AFRICAN AMERICAN: 9 mL/min — AB (ref >60)
GFR, EST NON AFRICAN AMERICAN: 10 mL/min — AB (ref >60)
GFR, EST NON AFRICAN AMERICAN: 8 mL/min — AB (ref >60)
Glucose, Bld: 93 mg/dL (ref 65–99)
Glucose, Bld: 107 mg/dL — ABNORMAL HIGH (ref 65–99)
PHOSPHORUS: 3.2 mg/dL (ref 2.5–4.6)
POTASSIUM: 4.6 mmol/L (ref 3.5–5.1)
POTASSIUM: 4.6 mmol/L (ref 3.5–5.1)
Phosphorus: 2.3 mg/dL — ABNORMAL LOW (ref 2.5–4.6)
Sodium: 135 mmol/L (ref 135–145)
Sodium: 130 mmol/L — ABNORMAL LOW (ref 135–145)

## 2016-04-09 LAB — MRSA PCR SCREENING: MRSA by PCR: NEGATIVE

## 2016-04-09 MED ORDER — IOPAMIDOL (ISOVUE-300) INJECTION 61%
INTRAVENOUS | Status: AC
  Administered 2016-04-09: 100 mL
  Filled 2016-04-09: qty 100

## 2016-04-09 MED ORDER — DIATRIZOATE MEGLUMINE & SODIUM 66-10 % PO SOLN
ORAL | Status: AC
  Filled 2016-04-09: qty 30

## 2016-04-09 MED ORDER — VANCOMYCIN HCL IN DEXTROSE 750-5 MG/150ML-% IV SOLN
750.0000 mg | Freq: Once | INTRAVENOUS | Status: AC
  Administered 2016-04-09: 750 mg via INTRAVENOUS
  Filled 2016-04-09: qty 150

## 2016-04-09 NOTE — Progress Notes (Signed)
Subjective: Pt doing well this morning. He has not had any BMs since admission. Reports chills during HD yesterday. Low grade temps overnight, Tmax 100.57F.  Objective: Vital signs in last 24 hours: Filed Vitals:   04/08/16 2102 04/08/16 2117 04/08/16 2215 04/09/16 0618  BP: 171/90 169/88 147/78 142/79  Pulse: 100 98 110 87  Temp:  98.4 F (36.9 C) 100.4 F (38 C) 98.1 F (36.7 C)  TempSrc:  Oral Oral Oral  Resp: 16 13 16 16   Height:      Weight:  164 lb 0.4 oz (74.4 kg)  165 lb 9.1 oz (75.1 kg)  SpO2:   100% 100%   Weight change:   Intake/Output Summary (Last 24 hours) at 04/09/16 1212 Last data filed at 04/09/16 0620  Gross per 24 hour  Intake    390 ml  Output   3500 ml  Net  -3110 ml   General: NAD, laying in bed comfortably HEENT: moist mucous membranes Lungs: CTAB, no wheezing, rubs, or crackles Cardiac: RRR, no murmurs, rubs, gallop GI: soft, active bowel sounds, TTP of RLQ Neuro: CN II-XII grossly intact Ext: no pedal edema  Lab Results: Basic Metabolic Panel:  Recent Labs Lab 04/08/16 1114 04/09/16 0245  NA 133* 135  K 4.8 4.6  CL 99* 99*  CO2 24 25  GLUCOSE 84 93  BUN 23* 13  CREATININE 9.88* 6.14*  CALCIUM 9.1 9.0  PHOS  --  2.3*   Liver Function Tests:  Recent Labs Lab 04/08/16 1114 04/09/16 0245  AST 15  --   ALT 8*  --   ALKPHOS 73  --   BILITOT 0.4  --   PROT 7.5  --   ALBUMIN 2.6* 2.7*   CBC:  Recent Labs Lab 04/08/16 1114 04/09/16 0245  WBC 3.6* 4.4  HGB 10.0* 9.8*  HCT 32.9* 32.2*  MCV 76.2* 76.3*  PLT 213 192    Micro Results: Recent Results (from the past 240 hour(s))  MRSA PCR Screening     Status: None   Collection Time: 04/09/16  5:17 AM  Result Value Ref Range Status   MRSA by PCR NEGATIVE NEGATIVE Final    Comment:        The GeneXpert MRSA Assay (FDA approved for NASAL specimens only), is one component of a comprehensive MRSA colonization surveillance program. It is not intended to diagnose  MRSA infection nor to guide or monitor treatment for MRSA infections.    Studies/Results: Dg Chest 2 View  04/08/2016  CLINICAL DATA:  Blood in stool.  Fever of 109 for 1 day.  Weakness. EXAM: CHEST  2 VIEW COMPARISON:  Chest x-ray dated 12/24/2015. FINDINGS: Right-sided dialysis catheter appears stable in position. Cardiomediastinal silhouette is stable. Heart size is normal. The moderate-sized right pleural effusion appears stable, with probable underlying atelectasis. Left lung remains clear. Osseous structures about the chest are unremarkable. IMPRESSION: No significant change compared to chest x-ray of 12/24/2015. Moderate-sized right pleural effusion appears stable, with probable chronic underlying atelectasis. No new lung findings. Electronically Signed   By: Franki Cabot M.D.   On: 04/08/2016 12:26   Ct Abdomen Pelvis W Contrast  04/09/2016  CLINICAL DATA:  RIGHT lower quadrant abdominal pain yesterday, fever, joint pain, blood in stool for a tonight and Saturday morning, end-stage renal disease on dialysis for 8 months, prior kidney transplant EXAM: CT ABDOMEN AND PELVIS WITH CONTRAST TECHNIQUE: Multidetector CT imaging of the abdomen and pelvis was performed using the standard protocol following  bolus administration of intravenous contrast. Sagittal and coronal MPR images reconstructed from axial data set. CONTRAST:  155m ISOVUE-300 IOPAMIDOL (ISOVUE-300) INJECTION 61% IV. Dilute oral contrast. COMPARISON:  None. FINDINGS: Lower chest: Moderate RIGHT pleural effusion, question partially loculated with compressive atelectasis of RIGHT lower lobe. Hepatobiliary: Tiny focus of calcification at the anterior gallbladder wall. Liver and gallbladder otherwise unremarkable. Pancreas: Normal appearance Spleen: Normal appearance Adrenals/Urinary Tract: Adrenal glands normal appearance. Marked BILATERAL cortical atrophy of native kidneys. Transplant kidney RIGHT iliac fossa without mass or  hydronephrosis. Bladder decompressed. Prostatic enlargement gland 5.9 x 4.1 cm image 71. Stomach/Bowel: Appendix is inadequately delineated due to small bowel loops adjacent to cecum. Questionable wall thickening of cecum and proximal ascending as wel as sigmoid colon versus artifacts from underdistention. Slight wall thickening of gastric antrum. Remaining bowel loops unremarkable. Vascular/Lymphatic: Prior arterial grafts at LEFT inguinal/femoral region. Vascular structures otherwise patent. No adenopathy. Reproductive: N/A Other: Small LEFT inguinal hernia containing fat. No free air, free fluid, or mass. Small umbilical hernia containing fat. Musculoskeletal: Osseous structures unremarkable IMPRESSION: Unremarkable transplant kidney RIGHT iliac fossa. Small umbilical and LEFT inguinal hernias containing fat. Moderate RIGHT pleural effusion question partially loculated with compressive atelectasis RIGHT lower lobe. Prostatic enlargement. Unable to exclude wall thickening/colitis at the cecum, proximal ascending and sigmoid colon versus artifact from underdistention ; can correlate with colonoscopy. Appendix inadequately visualized. Electronically Signed   By: MLavonia DanaM.D.   On: 04/09/2016 11:02   UKoreaRenal  04/08/2016  CLINICAL DATA:  Abdominal pain, fatigue and chronic kidney disease with history of renal transplantation. EXAM: RENAL / URINARY TRACT ULTRASOUND COMPLETE COMPARISON:  02/14/2015 FINDINGS: Right Kidney: Length: 6.4 cm. Diffusely atrophic native right kidney. No hydronephrosis. No solid masses. Left Kidney: Length: 5.7 cm. Diffusely atrophic left kidney. No masses or hydronephrosis. Transplant kidney (right lower quadrant): Length of 11.6 cm. Echogenicity similar to prior ultrasound with suggestion of slightly increased cortical echogenicity compared to the prior ultrasound. No evidence of hydronephrosis. Formal vascular evaluation of the transplant was not performed. There is blood flow  present by color Doppler in the transplant kidney. No abnormal surrounding fluid collections. Bladder: Appears normal for degree of bladder distention. IMPRESSION: The renal transplant no evidence of hydronephrosis. Suggestion of slightly increased cortical echogenicity of the transplanted kidney compared to the prior ultrasound which may implicate some degree of underlying chronic rejection. Electronically Signed   By: GAletta EdouardM.D.   On: 04/08/2016 14:23   Medications: I have reviewed the patient's current medications. Scheduled Meds: . amLODipine  10 mg Oral Daily  . aspirin EC  81 mg Oral QHS  . [START ON 04/11/2016] calcitRIOL  0.5 mcg Oral Q T,Th,Sa-HD  . calcium-vitamin D  1 tablet Oral QPM  . carvedilol  25 mg Oral BID  . cinacalcet  30 mg Oral Daily  . diatrizoate meglumine-sodium      . ferrous sulfate  325 mg Oral TID WC  . hydrALAZINE  100 mg Oral BID  . lisinopril  40 mg Oral QHS  . piperacillin-tazobactam (ZOSYN)  IV  2.25 g Intravenous Q8H  . sevelamer carbonate  2,400 mg Oral TID WC  . traZODone  50 mg Oral QHS  . vancomycin  750 mg Intravenous Once   Continuous Infusions:  PRN Meds:.traMADol Assessment/Plan: Principal Problem:   Hematochezia Active Problems:   OSA (obstructive sleep apnea)   ESRD on dialysis (HCC)   Chronic anemia   H/O kidney transplant   HTN (hypertension)  Intravenous line infection ([place])  Fever: likely vas cath line infection vs c.diff. Also could be due to renal allograft rejection as he has not been on immunosuppressive tx. CT abd cannot rule out colitis vs artifact from underdistention.  - blood Cx (6/24) NGTD -Start empiric vanc/zosyn IV 6/24>>> -If culture positive will need line holiday and clearance - UA and Urine Cx ordered - C.diff ordered - renal u/s w/ dopplers ordered - if patient develops chills while on HD will ask RN to obtain blood cultures from vas cath line.   Hematochezia: 3 episodes of painless BRBPR overnight  before presentation.hgb stable and he has not had a BM since admission. Had previous episode of hematochezia while at [facility] last year and colonoscopy was recommended.  - Hold pharmacologic VTE ppx - C. Diff. PCR, enteric precautions - colonoscopy as outpatient unless he has another episode of hematochezia this admission, then can consider inpatient workup  ESRD on HD: S/p renal transplant for glomerulonephritis ~15 years ago with acute on chronic failure last year. Currently he has only a vasocath for access. He has numerous failed access sites sparing mostly just his LUE, but is on the transplant list a [facility] and does not have a specific plan yet for vascular access. TTS schedule. -Nephrology following, recommendations appreciated -Renal function panel in AM  Hypertension:controlled s/p HD yesterday -Amlodipine 10m -Coreg 229mBID -Hydralazine 10063mID -lisinopril 51m37mnemia of chronic disease:  -Continue ferrous sulfate 325mg25mronic R pleural effusion: pleural effusion stable from prior imaging this admission, follows with Dr. [facility], plan for pleurodesis in the future if effusion worsens.   Dispo: Disposition is deferred at this time, awaiting improvement of current medical problems.    The patient does have a current PCP (OlugbTresa Garter and [facility].  The patient does not have transportation limitations that hinder transportation to clinic appointments.  .Services Needed at time of discharge: Y = Yes, Blank = No PT:   OT:   RN:   Equipment:   Other:     LOS: 1 day   DianaNorman Herrlich6/25/2017, 12:12 PM

## 2016-04-09 NOTE — Progress Notes (Signed)
Internal Medicine Attending:   I saw and examined the patient. I reviewed the resident's note and I agree with the resident's findings and plan as documented in the resident's note.  Doing well today, alert and abdominal pain is well controlled. He had a fever overnight to 100.4. No further hematochezia. He is high risk for a recurrent C diff infection, so we will collect a stool sample for PCR. Blood cultures no growth at one day. Doing well with inpatient HD. If we do not find an infection to explain his fever and abdominal pain, we will plan for transplant nephrectomy for likely rejection.

## 2016-04-09 NOTE — Progress Notes (Signed)
Assessment: 1 Acute renal allograft rejection, suspected 2 R/O infection (HD catheter, etc) 3 ESRD on HD TTS 4 HD catheter 5 Failed Renal Transplant Plan: If cultures negative, refer for transplant nephrectomy  Subjective: Interval History: Feels better today  Objective: Vital signs in last 24 hours: Temp:  [98.1 F (36.7 C)-100.4 F (38 C)] 98.1 F (36.7 C) (06/25 0618) Pulse Rate:  [87-110] 87 (06/25 0618) Resp:  [12-24] 16 (06/25 0618) BP: (142-207)/(78-112) 142/79 mmHg (06/25 0618) SpO2:  [96 %-100 %] 100 % (06/25 0618) Weight:  [74.4 kg (164 lb 0.4 oz)-77.9 kg (171 lb 11.8 oz)] 75.1 kg (165 lb 9.1 oz) (06/25 0618) Weight change:   Intake/Output from previous day: 06/24 0701 - 06/25 0700 In: 390 [P.O.:240; IV Piggyback:150] Out: 3500  Intake/Output this shift:    General appearance: alert and cooperative GI: tender renal allograft, mild to mod Extremities: extremities normal, atraumatic, no cyanosis or edema  TDC RUQ   Lab Results:  Recent Labs  04/08/16 1114 04/09/16 0245  WBC 3.6* 4.4  HGB 10.0* 9.8*  HCT 32.9* 32.2*  PLT 213 192   BMET:  Recent Labs  04/08/16 1114 04/09/16 0245  NA 133* 135  K 4.8 4.6  CL 99* 99*  CO2 24 25  GLUCOSE 84 93  BUN 23* 13  CREATININE 9.88* 6.14*  CALCIUM 9.1 9.0   No results for input(s): PTH in the last 72 hours. Iron Studies: No results for input(s): IRON, TIBC, TRANSFERRIN, FERRITIN in the last 72 hours. Studies/Results: Dg Chest 2 View  04/08/2016  CLINICAL DATA:  Blood in stool.  Fever of 109 for 1 day.  Weakness. EXAM: CHEST  2 VIEW COMPARISON:  Chest x-ray dated 12/24/2015. FINDINGS: Right-sided dialysis catheter appears stable in position. Cardiomediastinal silhouette is stable. Heart size is normal. The moderate-sized right pleural effusion appears stable, with probable underlying atelectasis. Left lung remains clear. Osseous structures about the chest are unremarkable. IMPRESSION: No significant change  compared to chest x-ray of 12/24/2015. Moderate-sized right pleural effusion appears stable, with probable chronic underlying atelectasis. No new lung findings. Electronically Signed   By: Franki Cabot M.D.   On: 04/08/2016 12:26   US Renal  04/08/2016  CLINICAL DATA:  Abdominal pain, fatigue and chronic kidney disease with history of renal transplantation. EXAM: RENAL / URINARY TRACT ULTRASOUND COMPLETE COMPARISON:  02/14/2015 FINDINGS: Right Kidney: Length: 6.4 cm. Diffusely atrophic native right kidney. No hydronephrosis. No solid masses. Left Kidney: Length: 5.7 cm. Diffusely atrophic left kidney. No masses or hydronephrosis. Transplant kidney (right lower quadrant): Length of 11.6 cm. Echogenicity similar to prior ultrasound with suggestion of slightly increased cortical echogenicity compared to the prior ultrasound. No evidence of hydronephrosis. Formal vascular evaluation of the transplant was not performed. There is blood flow present by color Doppler in the transplant kidney. No abnormal surrounding fluid collections. Bladder: Appears normal for degree of bladder distention. IMPRESSION: The renal transplant no evidence of hydronephrosis. Suggestion of slightly increased cortical echogenicity of the transplanted kidney compared to the prior ultrasound which may implicate some degree of underlying chronic rejection. Electronically Signed   By: Aletta Edouard M.D.   On: 04/08/2016 14:23   Scheduled: . amLODipine  10 mg Oral Daily  . aspirin EC  81 mg Oral QHS  . [START ON 04/11/2016] calcitRIOL  0.5 mcg Oral Q T,Th,Sa-HD  . calcium-vitamin D  1 tablet Oral QPM  . carvedilol  25 mg Oral BID  . cinacalcet  30 mg Oral Daily  .  diatrizoate meglumine-sodium      . ferrous sulfate  325 mg Oral TID WC  . hydrALAZINE  100 mg Oral BID  . lisinopril  40 mg Oral QHS  . piperacillin-tazobactam (ZOSYN)  IV  2.25 g Intravenous Q8H  . sevelamer carbonate  2,400 mg Oral TID WC  . traZODone  50 mg Oral QHS      LOS: 1 day   Jessyca Sloan C 04/09/2016,10:53 AM

## 2016-04-09 NOTE — Progress Notes (Signed)
Pharmacy Antibiotic Note  Jonathon Conley is a 50 y.o. male admitted on 04/08/2016 with sepsis Pharmacy has been consulted for Vancomycin and zosyn dosing. WBC 3.6>4.4, now afebrile, LA 0.86. ESRD on HD.   Plan:  Vancomycin 761m x1 today  F/U HD plans and further abx dosing (vancomycin 7581mIV after HD sessions)  Continue Zosyn 2.25g IV Q8h  F/U LOT, cultures  Target pre-HD vanc level: 15-2529mmL  Target post-HD vanc level: 5-22m45mL   Height: 5' 10"  (177.8 cm) Weight: 165 lb 9.1 oz (75.1 kg) IBW/kg (Calculated) : 73  Temp (24hrs), Avg:99.5 F (37.5 C), Min:98.1 F (36.7 C), Max:100.4 F (38 C)   Recent Labs Lab 04/08/16 1114 04/08/16 1120 04/08/16 1535 04/09/16 0245  WBC 3.6*  --   --  4.4  CREATININE 9.88*  --   --  6.14*  LATICACIDVEN  --  0.86 0.55  --     Estimated Creatinine Clearance: 15 mL/min (by C-G formula based on Cr of 6.14).    No Known Allergies  Antimicrobials this admission: 6/24: Vanc> 6/24 zosyn>>  Microbiology results: 6/24 BCx x2>>sent  6/24 MRSA PCR (-)  Thank you for allowing pharmacy to be a part of this patient's care.  Simran Bomkamp C. MileLennox GrumblesarmD Pharmacy Resident  Pager: [phone]/2017 12:15 PM

## 2016-04-10 DIAGNOSIS — D649 Anemia, unspecified: Secondary | ICD-10-CM

## 2016-04-10 DIAGNOSIS — R1031 Right lower quadrant pain: Secondary | ICD-10-CM

## 2016-04-10 DIAGNOSIS — K921 Melena: Secondary | ICD-10-CM

## 2016-04-10 DIAGNOSIS — R933 Abnormal findings on diagnostic imaging of other parts of digestive tract: Secondary | ICD-10-CM

## 2016-04-10 DIAGNOSIS — K6389 Other specified diseases of intestine: Secondary | ICD-10-CM

## 2016-04-10 LAB — CBC
HCT: 31.1 % — ABNORMAL LOW (ref 39.0–52.0)
HEMATOCRIT: 30.4 % — AB (ref 39.0–52.0)
Hemoglobin: 9.4 g/dL — ABNORMAL LOW (ref 13.0–17.0)
Hemoglobin: 9.7 g/dL — ABNORMAL LOW (ref 13.0–17.0)
MCH: 23.4 pg — AB (ref 26.0–34.0)
MCH: 23.4 pg — ABNORMAL LOW (ref 26.0–34.0)
MCHC: 30.9 g/dL (ref 30.0–36.0)
MCHC: 31.2 g/dL (ref 30.0–36.0)
MCV: 75.6 fL — AB (ref 78.0–100.0)
MCV: 74.9 fL — ABNORMAL LOW (ref 78.0–100.0)
PLATELETS: 177 10*3/uL (ref 150–400)
PLATELETS: 184 10*3/uL (ref 150–400)
RBC: 4.02 MIL/uL — ABNORMAL LOW (ref 4.22–5.81)
RBC: 4.15 MIL/uL — ABNORMAL LOW (ref 4.22–5.81)
RDW: 24.2 % — AB (ref 11.5–15.5)
RDW: 24.4 % — AB (ref 11.5–15.5)
WBC: 3.7 10*3/uL — ABNORMAL LOW (ref 4.0–10.5)
WBC: 4.9 10*3/uL (ref 4.0–10.5)

## 2016-04-10 LAB — RENAL FUNCTION PANEL
ALBUMIN: 2.6 g/dL — AB (ref 3.5–5.0)
Anion gap: 10 (ref 5–15)
BUN: 26 mg/dL — AB (ref 6–20)
CALCIUM: 8.5 mg/dL — AB (ref 8.9–10.3)
CO2: 27 mmol/L (ref 22–32)
CREATININE: 10.48 mg/dL — AB (ref 0.61–1.24)
Chloride: 96 mmol/L — ABNORMAL LOW (ref 101–111)
GFR calc Af Amer: 6 mL/min — ABNORMAL LOW (ref >60)
GFR, EST NON AFRICAN AMERICAN: 5 mL/min — AB (ref >60)
Glucose, Bld: 114 mg/dL — ABNORMAL HIGH (ref 65–99)
PHOSPHORUS: 3.4 mg/dL (ref 2.5–4.6)
POTASSIUM: 4.5 mmol/L (ref 3.5–5.1)
SODIUM: 133 mmol/L — AB (ref 135–145)

## 2016-04-10 MED ORDER — HEPARIN SODIUM (PORCINE) 1000 UNIT/ML DIALYSIS
1000.0000 [IU] | INTRAMUSCULAR | Status: DC | PRN
  Filled 2016-04-10: qty 1

## 2016-04-10 MED ORDER — SODIUM CHLORIDE 0.9 % IV SOLN: 100.0000 mL | INTRAVENOUS | Status: DC | PRN

## 2016-04-10 MED ORDER — LIDOCAINE-PRILOCAINE 2.5-2.5 % EX CREA
1.0000 "application " | TOPICAL_CREAM | CUTANEOUS | Status: DC | PRN
  Filled 2016-04-10: qty 5

## 2016-04-10 MED ORDER — ALTEPLASE 2 MG IJ SOLR: 2.0000 mg | Freq: Once | INTRAMUSCULAR | Status: DC | PRN

## 2016-04-10 MED ORDER — HEPARIN SODIUM (PORCINE) 1000 UNIT/ML DIALYSIS
2000.0000 [IU] | INTRAMUSCULAR | Status: DC | PRN
  Filled 2016-04-10: qty 2

## 2016-04-10 MED ORDER — PENTAFLUOROPROP-TETRAFLUOROETH EX AERO: 1.0000 "application " | INHALATION_SPRAY | CUTANEOUS | Status: DC | PRN

## 2016-04-10 MED ORDER — LIDOCAINE HCL (PF) 1 % IJ SOLN: 5.0000 mL | INTRAMUSCULAR | Status: DC | PRN

## 2016-04-10 NOTE — Progress Notes (Signed)
Subjective: Pt doing well this morning. He has not had any BMs since his admission for hematochezia. His fevers and chills at night are improved compared to yesterday. He reports tenderness to deep palpation over his RLQ that is also decreased since admission but it does not hurt at rest or moving.  Objective: Vital signs in last 24 hours: Filed Vitals:   04/09/16 2214 04/10/16 0110 04/10/16 0708 04/10/16 0937  BP: 136/78 118/68 135/82 129/82  Pulse: 83 79 78 85  Temp: 98 F (36.7 C) 98.1 F (36.7 C) 98.2 F (36.8 C) 98.4 F (36.9 C)  TempSrc: Oral Oral Oral Oral  Resp: 17 18 18 18   Height:      Weight:   73.165 kg (161 lb 4.8 oz)   SpO2: 100% 99% 100% 100%   Weight change:   Intake/Output Summary (Last 24 hours) at 04/10/16 1502 Last data filed at 04/10/16 1109  Gross per 24 hour  Intake    958 ml  Output      0 ml  Net    958 ml   GENERAL- NAD, laying in bed comfortably HEENT- moist mucous membranes CARDIAC- RRR, no murmurs, rubs or gallops. RESP- CTAB, no wheezing, rubs, or crackle ABDOMEN- soft, active bowel sounds, TTP of RLQ EXTREMITIES- no pedal edema  Lab Results: Basic Metabolic Panel:  Recent Labs Lab 04/09/16 0245 04/09/16 1433  NA 135 130*  K 4.6 4.6  CL 99* 97*  CO2 25 27  GLUCOSE 93 107*  BUN 13 20  CREATININE 6.14* 7.42*  CALCIUM 9.0 8.6*  PHOS 2.3* 3.2   Liver Function Tests:  Recent Labs Lab 04/08/16 1114 04/09/16 0245 04/09/16 1433  AST 15  --   --   ALT 8*  --   --   ALKPHOS 73  --   --   BILITOT 0.4  --   --   PROT 7.5  --   --   ALBUMIN 2.6* 2.7* 2.5*   CBC:  Recent Labs Lab 04/10/16 0549 04/10/16 1404  WBC 3.7* 4.9  HGB 9.4* 9.7*  HCT 30.4* 31.1*  MCV 75.6* 74.9*  PLT 177 184    Micro Results: Recent Results (from the past 240 hour(s))  Culture, blood (routine x 2)     Status: None (Preliminary result)   Collection Time: 04/08/16 12:36 PM  Result Value Ref Range Status   Specimen Description BLOOD LEFT HAND   Final   Special Requests IN PEDIATRIC BOTTLE 3CC  Final   Culture NO GROWTH 2 DAYS  Final   Report Status PENDING  Incomplete  Culture, blood (routine x 2)     Status: None (Preliminary result)   Collection Time: 04/08/16 12:40 PM  Result Value Ref Range Status   Specimen Description LEFT ANTECUBITAL  Final   Special Requests BOTTLES DRAWN AEROBIC AND ANAEROBIC 5CC  Final   Culture NO GROWTH 2 DAYS  Final   Report Status PENDING  Incomplete  MRSA PCR Screening     Status: None   Collection Time: 04/09/16  5:17 AM  Result Value Ref Range Status   MRSA by PCR NEGATIVE NEGATIVE Final    Comment:        The GeneXpert MRSA Assay (FDA approved for NASAL specimens only), is one component of a comprehensive MRSA colonization surveillance program. It is not intended to diagnose MRSA infection nor to guide or monitor treatment for MRSA infections.    Studies/Results: US Renal Transplant W/doppler  04/09/2016  CLINICAL  DATA:  Fever and failed right iliac fossa renal transplant. The patient is on hemodialysis. EXAM: ULTRASOUND OF RENAL TRANSPLANT WITH RENAL DOPPLER ULTRASOUND TECHNIQUE: Ultrasound examination of the renal transplant was performed with gray-scale, color and duplex doppler evaluation. COMPARISON:  CT of the abdomen and pelvis earlier today. FINDINGS: Transplant kidney location: RLQ Transplant Kidney: Length: 11.6 cm. Normal in size and parenchymal echogenicity. No evidence of mass or hydronephrosis. No peri-transplant fluid collection seen. Color flow in the main renal artery:  Yes Color flow in the main renal vein:  Yes Duplex Doppler Evaluation: Main Renal Artery Resistive Index: 0.7 Venous waveform in main renal vein:  Present Intrarenal resistive index in upper pole:  0.65 (normal 0.6-0.8; equivocal 0.8-0.9; abnormal >= 0.9) Intrarenal resistive index in lower pole: 0.72 (normal 0.6-0.8; equivocal 0.8-0.9; abnormal >= 0.9) Bladder: Normal for degree of bladder distention.  IMPRESSION: Unremarkable sonographic evaluation of the right lower quadrant renal transplant which demonstrates arterial and venous flow without significantly increased resistive indices of sampled arterial flow. No evidence of transplant hydronephrosis or surrounding fluid collections. Electronically Signed   By: Aletta Edouard M.D.   On: 04/09/2016 15:05   Ct Abdomen Pelvis W Contrast  04/09/2016  CLINICAL DATA:  RIGHT lower quadrant abdominal pain yesterday, fever, joint pain, blood in stool for a tonight and Saturday morning, end-stage renal disease on dialysis for 8 months, prior kidney transplant EXAM: CT ABDOMEN AND PELVIS WITH CONTRAST TECHNIQUE: Multidetector CT imaging of the abdomen and pelvis was performed using the standard protocol following bolus administration of intravenous contrast. Sagittal and coronal MPR images reconstructed from axial data set. CONTRAST:  129m ISOVUE-300 IOPAMIDOL (ISOVUE-300) INJECTION 61% IV. Dilute oral contrast. COMPARISON:  None. FINDINGS: Lower chest: Moderate RIGHT pleural effusion, question partially loculated with compressive atelectasis of RIGHT lower lobe. Hepatobiliary: Tiny focus of calcification at the anterior gallbladder wall. Liver and gallbladder otherwise unremarkable. Pancreas: Normal appearance Spleen: Normal appearance Adrenals/Urinary Tract: Adrenal glands normal appearance. Marked BILATERAL cortical atrophy of native kidneys. Transplant kidney RIGHT iliac fossa without mass or hydronephrosis. Bladder decompressed. Prostatic enlargement gland 5.9 x 4.1 cm image 71. Stomach/Bowel: Appendix is inadequately delineated due to small bowel loops adjacent to cecum. Questionable wall thickening of cecum and proximal ascending as wel as sigmoid colon versus artifacts from underdistention. Slight wall thickening of gastric antrum. Remaining bowel loops unremarkable. Vascular/Lymphatic: Prior arterial grafts at LEFT inguinal/femoral region. Vascular structures  otherwise patent. No adenopathy. Reproductive: N/A Other: Small LEFT inguinal hernia containing fat. No free air, free fluid, or mass. Small umbilical hernia containing fat. Musculoskeletal: Osseous structures unremarkable IMPRESSION: Unremarkable transplant kidney RIGHT iliac fossa. Small umbilical and LEFT inguinal hernias containing fat. Moderate RIGHT pleural effusion question partially loculated with compressive atelectasis RIGHT lower lobe. Prostatic enlargement. Unable to exclude wall thickening/colitis at the cecum, proximal ascending and sigmoid colon versus artifact from underdistention ; can correlate with colonoscopy. Appendix inadequately visualized. Electronically Signed   By: MLavonia DanaM.D.   On: 04/09/2016 11:02   Medications: I have reviewed the patient's current medications. Scheduled Meds: . amLODipine  10 mg Oral Daily  . aspirin EC  81 mg Oral QHS  . [START ON 04/11/2016] calcitRIOL  0.5 mcg Oral Q T,Th,Sa-HD  . calcium-vitamin D  1 tablet Oral QPM  . carvedilol  25 mg Oral BID  . ferrous sulfate  325 mg Oral TID WC  . hydrALAZINE  100 mg Oral BID  . lisinopril  40 mg Oral QHS  . sevelamer carbonate  2,400 mg Oral TID WC  . traZODone  50 mg Oral QHS   Continuous Infusions:  PRN Meds:.sodium chloride, sodium chloride, alteplase, heparin, [START ON 04/11/2016] heparin, lidocaine (PF), lidocaine-prilocaine, pentafluoroprop-tetrafluoroeth, traMADol Assessment/Plan: Fever: most likely source of infection would be vas cath line. However his presentation with hematochezia with focal abdominal tenderness suggest abdominal process. CT abdomen was equivocal for colitis. He continues to pass no bowel movements since his admission which is not strongly consistent with a C. Diff. colitis. Renal allograft rejection is possible however his clinical improvement without immunosuppressive therapy would be uncharacteristic of this. Blood cultures continue showing NGTD. We will discontinue  empiric antibiotics after today given negative culture data but may resume with repeat cultures if he develops fever again. - blood Cx (6/24) NGTD - Discontinue empiric vanc/zosyn IV (6/24>>>6/26) - If culture positive will need line holiday and clearance - UA and Urine Cx ordered - if patient develops chills while on HD will ask RN to obtain blood cultures from vas cath line.   Hematochezia: 3 episodes of painless BRBPR Friday night/Saturday morning before presentation to ED.hgb stable and he has not had a BM since admission. Had previous episode of hematochezia while at [facility] last year with a C. Diff colitis and colonoscopy was recommended but he has never followed up outpatient. Hgb is stable without ongoing bleeding and patient has been normal to hypertensive during this admission. - Hold pharmacologic VTE ppx - Will consult GI service for recommendations and consider colonoscopy for new painless hematochezia with questionable CT findings  ESRD on HD: S/p renal transplant for glomerulonephritis ~15 years ago with acute on chronic failure last year. Currently he has only a vasocath for access. He has numerous failed access sites sparing mostly just his LUE, but reports he is on the transplant list a [facility] and does not have a specific plan yet for vascular access. TTS schedule. Discontinue sensipar as patient reports not taking PTA. -Nephrology following, recommendations appreciated -Renal function panel  Hypertension: Normotensive on HD and home regimen: Amlodipine 84m, Coreg 248mBID, Hydralazine 10087mID, lisinopril 29m61memia of chronic disease: Stable. Continue ferrous sulfate 325mg45monic R pleural effusion: pleural effusion stable from prior imaging this admission, follows with Dr. Oak wLarey Dresser [facility], plan for eventual pleurodesis in the future given persistence and rapid reaccumulation  FULL CODE Diet: Renal VTE ppx: SCDs  Dispo: Disposition is deferred at this time, awaiting  improvement of current medical problems.    The patient does have a current PCP (OlugbTresa Garter and does not need [place] [facility] [place] after [place].  The patient does not have transportation limitations that hinder transportation to clinic appointments.    LOS: 2 days   ChrisCollier Salina6/26/2017, 3:02 PM

## 2016-04-10 NOTE — Consult Note (Signed)
[facility] Consult: 2:46 PM 04/10/2016  LOS: 2 days    Referring Provider: Dr Evette Doffing of teaching service  Primary Care Physician:  [name], MD Primary Gastroenterologist:  unassigned     Reason for Consultation:  Hematochezia.     HPI: Jonathon Conley is a pleasant and  50 y.o. male.  Hx ESRD due to hypertension, treated with both HD and PD before 2001 renal transplant.  Transplant subsequently failed and resumed TTS HD 07/2015.  No longer takes anti-rejection meds.   + pancytopenia as of 12/2015 labs.  Chronic anemia, previous transfusions (latest was 10/2014 when Hgb was 6.3), previous parenteral iron, takes scheduled Mircera through dialysis center.  OSA, not compliant with CPAP.  EF is 60 to 65% by 02/2015 echo.  Admission in 12/2015 with hypertensive urgency.  Left pleural effusion, 2 liter thoracentesis in 12/2015.  Demand ischemia and elevated troponins due to anemia 12/2015 Pt says he has had issues with hypotension during HD sessions.    Friday 6/23 PM and again on Sat 6/24 at 3 and 7 AM passed hematochezia.  He describes fevers, chills and pain/aches all over his body but not abdominal pain.  Came to ED, did have RLQ tenderness on exam, this resolved by the next day.  No n/v.  The blood was moderate volume.  6/25 CT scan: ? Thickening/colitis in cecum, prox ascending and sigmoid.  Right pleural effusion with loculation and RLL atx.  Temp to 100.4. BP 192/109.  No leukocytosis.  Hgb has gone from 10 to 9.4 to 9.7 since admission. MCV low at 76.  Treated empirically with Vanc and Zosyn for ~ 2 days, discontinued this AM.    At present ? If fever source is his HD catheter.  Has not had any stools or bleeding since arrival.    Previous limited bleeding PR in 2001 during admission leading up to  transplant.  He is not sure of the cause.  He also had limited blood per rectum prior to 11/02/2015 admission.  His only colonoscopy was ~1997, and he is not sure why it was done or what they found.   No previous EGD.  Infrequent use of NSAIDs, but daily 81 ASA.   No ETOH.  No reflux, no change in bowel habits or minor BPR.  Normally has a bm daily or qod.      Past Medical History  Diagnosis Date  . Hypertension   . Hyperlipidemia   . Chronic kidney disease   . OSA (obstructive sleep apnea) 03/27/2013  . Blood transfusion without reported diagnosis   . Renal insufficiency     Past Surgical History  Procedure Laterality Date  . Kidney transplant      Prior to Admission medications   Medication Sig Start Date End Date Taking? Authorizing Provider  amLODipine (NORVASC) 10 MG tablet Take 10 mg by mouth daily.   Yes Historical Provider, MD  aspirin EC 81 MG tablet Take 81 mg by mouth at bedtime.   Yes Historical Provider, MD  carvedilol (COREG) 25 MG tablet Take 25  mg by mouth 2 (two) times daily.    Yes Historical Provider, MD  furosemide (LASIX) 80 MG tablet Take 160 mg by mouth 2 (two) times daily.   Yes Historical Provider, MD  simvastatin (ZOCOR) 5 MG tablet Take 1 tablet (5 mg total) by mouth at bedtime. 02/17/15  Yes Shanker Kristeen Mans, MD  calcitRIOL (ROCALTROL) 0.5 MCG capsule Take 1 capsule (0.5 mcg total) by mouth Every Tuesday,Thursday,and Saturday with dialysis. Patient not taking: Reported on 04/09/2016 09/24/15   Orson Eva, MD  Calcium Carbonate-Vitamin D (CALCIUM 600+D) 600-400 MG-UNIT tablet Take 1 tablet by mouth every evening. Reported on 04/09/2016    Historical Provider, MD  cinacalcet (SENSIPAR) 30 MG tablet Take 30 mg by mouth daily. Reported on 04/09/2016    Historical Provider, MD  ferrous sulfate 325 (65 FE) MG tablet Take 325 mg by mouth 3 (three) times daily with meals. Reported on 04/09/2016    Historical Provider, MD  hydrALAZINE (APRESOLINE) 50 MG tablet Take 100 mg by  mouth 2 (two) times daily. Reported on 04/09/2016    Historical Provider, MD  lisinopril (PRINIVIL,ZESTRIL) 40 MG tablet Take 1 tablet (40 mg total) by mouth at bedtime. Patient not taking: Reported on 04/09/2016 01/04/16   Tresa Garter, MD  predniSONE (DELTASONE) 5 MG tablet Take 5 mg by mouth daily with breakfast. Reported on 04/09/2016    Historical Provider, MD  sevelamer carbonate (RENVELA) 800 MG tablet Take 2,400 mg by mouth 3 (three) times daily with meals. Reported on 04/09/2016    Historical Provider, MD    Scheduled Meds: . amLODipine  10 mg Oral Daily  . aspirin EC  81 mg Oral QHS  . [START ON 04/11/2016] calcitRIOL  0.5 mcg Oral Q T,Th,Sa-HD  . calcium-vitamin D  1 tablet Oral QPM  . carvedilol  25 mg Oral BID  . ferrous sulfate  325 mg Oral TID WC  . hydrALAZINE  100 mg Oral BID  . lisinopril  40 mg Oral QHS  . sevelamer carbonate  2,400 mg Oral TID WC  . traZODone  50 mg Oral QHS   Infusions:   PRN Meds: sodium chloride, sodium chloride, alteplase, heparin, [START ON 04/11/2016] heparin, lidocaine (PF), lidocaine-prilocaine, pentafluoroprop-tetrafluoroeth, traMADol   Allergies as of 04/08/2016  . (No Known Allergies)    Family History  Problem Relation Age of Onset  . Heart disease Mother   . Cancer Mother     ovarian  . Cancer Maternal Grandmother   . Cancer Maternal Grandfather   . Asthma Son   . Asthma Son     Social History   Social History  . Marital Status: Single    Spouse Name: N/A  . Number of Children: N/A  . Years of Education: N/A   Occupational History  . Not on file.   Social History Main Topics  . Smoking status: Never Smoker   . Smokeless tobacco: Never Used  . Alcohol Use: No  . Drug Use: No  . Sexual Activity: Not on file   Other Topics Concern  . Not on file   Social History Narrative    REVIEW OF SYSTEMS: Constitutional:  Feels better and stronger than just PTA ENT:  No nose bleeds Pulm:  No SOB or cough CV:  No  palpitations, no LE edema.  GU:  No hematuria, no frequency GI:  Per HPI.  No indigestion, no heartburn, no dysphagia.  Heme:  Per HPI   Transfusions:  Per HPI Neuro:  No  headaches, no peripheral tingling or numbness Derm:  No itching, no rash or sores.  Endocrine:  No sweats or chills.  No polyuria or dysuria Immunization:  Not queried Travel:  None beyond local counties in last few months.    PHYSICAL EXAM: Vital signs in last 24 hours: Filed Vitals:   04/10/16 0708 04/10/16 0937  BP: 135/82 129/82  Pulse: 78 85  Temp: 98.2 F (36.8 C) 98.4 F (36.9 C)  Resp: 18 18   Wt Readings from Last 3 Encounters:  04/10/16 73.165 kg (161 lb 4.8 oz)  02/08/16 78.472 kg (173 lb)  02/01/16 79.379 kg (175 lb)    General: pleasant, looks well.  Head:  No asymmetry or swelling  Eyes:  No icterus, no conj pallor.   Ears:  Not HOH  Nose:  No discharge or congestion.  Mouth:  Moist and clear Neck:  No mass or JVD Lungs:  Clear bil.  No cough or dyspnea Heart: RRR.  No MRG.  S1/S2 present.  Abdomen:  Soft, NT, ND.  No mass or HSM.  Active BS.   Rectal: deferred   Musc/Skeltl: no joint erythema or swelling Extremities:  No CCE.    Neurologic:  Oriented x 3.  No tremor, no limb weakness.  No gross deficits.  Limb strength is full.  Skin:  No sores or rashes.  Psych:  Pleasant, cooperative.   Intake/Output from previous day: 06/25 0701 - 06/26 0700 In: 630 [P.O.:480; IV Piggyback:150] Out: 0  Intake/Output this shift: Total I/O In: 448 [P.O.:448] Out: -   LAB RESULTS:  Recent Labs  04/09/16 0245 04/10/16 0549 04/10/16 1404  WBC 4.4 3.7* 4.9  HGB 9.8* 9.4* 9.7*  HCT 32.2* 30.4* 31.1*  PLT 192 177 184   BMET Lab Results  Component Value Date   NA 130* 04/09/2016   NA 135 04/09/2016   NA 133* 04/08/2016   K 4.6 04/09/2016   K 4.6 04/09/2016   K 4.8 04/08/2016   CL 97* 04/09/2016   CL 99* 04/09/2016   CL 99* 04/08/2016   CO2 27 04/09/2016   CO2 25 04/09/2016    CO2 24 04/08/2016   GLUCOSE 107* 04/09/2016   GLUCOSE 93 04/09/2016   GLUCOSE 84 04/08/2016   BUN 20 04/09/2016   BUN 13 04/09/2016   BUN 23* 04/08/2016   CREATININE 7.42* 04/09/2016   CREATININE 6.14* 04/09/2016   CREATININE 9.88* 04/08/2016   CALCIUM 8.6* 04/09/2016   CALCIUM 9.0 04/09/2016   CALCIUM 9.1 04/08/2016   LFT  Recent Labs  04/08/16 1114 04/09/16 0245 04/09/16 1433  PROT 7.5  --   --   ALBUMIN 2.6* 2.7* 2.5*  AST 15  --   --   ALT 8*  --   --   ALKPHOS 73  --   --   BILITOT 0.4  --   --    PT/INR Lab Results  Component Value Date   INR 1.19 11/03/2015   INR 1.19 02/15/2015   Hepatitis Panel No results for input(s): HEPBSAG, HCVAB, HEPAIGM, HEPBIGM in the last 72 hours. C-Diff No components found for: CDIFF Lipase     Component Value Date/Time   LIPASE 141* 09/21/2015 1556    Drugs of Abuse  No results found for: LABOPIA, COCAINSCRNUR, LABBENZ, AMPHETMU, THCU, LABBARB   RADIOLOGY STUDIES: US Renal Transplant W/doppler  04/09/2016  CLINICAL DATA:  Fever and failed right iliac fossa renal transplant. The patient is on hemodialysis. EXAM: ULTRASOUND OF RENAL TRANSPLANT WITH  RENAL DOPPLER ULTRASOUND TECHNIQUE: Ultrasound examination of the renal transplant was performed with gray-scale, color and duplex doppler evaluation. COMPARISON:  CT of the abdomen and pelvis earlier today. FINDINGS: Transplant kidney location: RLQ Transplant Kidney: Length: 11.6 cm. Normal in size and parenchymal echogenicity. No evidence of mass or hydronephrosis. No peri-transplant fluid collection seen. Color flow in the main renal artery:  Yes Color flow in the main renal vein:  Yes Duplex Doppler Evaluation: Main Renal Artery Resistive Index: 0.7 Venous waveform in main renal vein:  Present Intrarenal resistive index in upper pole:  0.65 (normal 0.6-0.8; equivocal 0.8-0.9; abnormal >= 0.9) Intrarenal resistive index in lower pole: 0.72 (normal 0.6-0.8; equivocal 0.8-0.9; abnormal >=  0.9) Bladder: Normal for degree of bladder distention. IMPRESSION: Unremarkable sonographic evaluation of the right lower quadrant renal transplant which demonstrates arterial and venous flow without significantly increased resistive indices of sampled arterial flow. No evidence of transplant hydronephrosis or surrounding fluid collections. Electronically Signed   By: Aletta Edouard M.D.   On: 04/09/2016 15:05   Ct Abdomen Pelvis W Contrast  04/09/2016  CLINICAL DATA:  RIGHT lower quadrant abdominal pain yesterday, fever, joint pain, blood in stool for a tonight and Saturday morning, end-stage renal disease on dialysis for 8 months, prior kidney transplant EXAM: CT ABDOMEN AND PELVIS WITH CONTRAST TECHNIQUE: Multidetector CT imaging of the abdomen and pelvis was performed using the standard protocol following bolus administration of intravenous contrast. Sagittal and coronal MPR images reconstructed from axial data set. CONTRAST:  117m ISOVUE-300 IOPAMIDOL (ISOVUE-300) INJECTION 61% IV. Dilute oral contrast. COMPARISON:  None. FINDINGS: Lower chest: Moderate RIGHT pleural effusion, question partially loculated with compressive atelectasis of RIGHT lower lobe. Hepatobiliary: Tiny focus of calcification at the anterior gallbladder wall. Liver and gallbladder otherwise unremarkable. Pancreas: Normal appearance Spleen: Normal appearance Adrenals/Urinary Tract: Adrenal glands normal appearance. Marked BILATERAL cortical atrophy of native kidneys. Transplant kidney RIGHT iliac fossa without mass or hydronephrosis. Bladder decompressed. Prostatic enlargement gland 5.9 x 4.1 cm image 71. Stomach/Bowel: Appendix is inadequately delineated due to small bowel loops adjacent to cecum. Questionable wall thickening of cecum and proximal ascending as wel as sigmoid colon versus artifacts from underdistention. Slight wall thickening of gastric antrum. Remaining bowel loops unremarkable. Vascular/Lymphatic: Prior arterial  grafts at LEFT inguinal/femoral region. Vascular structures otherwise patent. No adenopathy. Reproductive: N/A Other: Small LEFT inguinal hernia containing fat. No free air, free fluid, or mass. Small umbilical hernia containing fat. Musculoskeletal: Osseous structures unremarkable IMPRESSION: Unremarkable transplant kidney RIGHT iliac fossa. Small umbilical and LEFT inguinal hernias containing fat. Moderate RIGHT pleural effusion question partially loculated with compressive atelectasis RIGHT lower lobe. Prostatic enlargement. Unable to exclude wall thickening/colitis at the cecum, proximal ascending and sigmoid colon versus artifact from underdistention ; can correlate with colonoscopy. Appendix inadequately visualized. Electronically Signed   By: MLavonia DanaM.D.   On: 04/09/2016 11:02    ENDOSCOPIC STUDIES: Per HPI  IMPRESSION:   *  Painless (caveat: RLQ tenderness) hematochezia.  ? Colitis in right colon and sigmoid per CT.  ? Ischemic colitis despite his being hypertensive at ED arrival, ? Infectious given fever/body aches?  *  ESRD. DDTK in 2001, failed and started HD again in 07/2015  * Recent pancytopenia Anemia of CKD: scheduled Mircera and previous parenteral iron and transfusions.    PLAN:     *  Per Dr NSilverio Decamp ?colonoscopy?  Pt ok with this so long as he is sedated.    SAzucena Freed 04/10/2016, 2:46 PM Pager:  [phone]      Attending physician's note   I have taken a history, examined the patient and reviewed the chart. I agree with the Advanced Practitioner's note, impression and recommendations. 39 yr M with h/o ESRD s/p renal transplant in 2001, now back on HD admitted with RLQ abdominal pain and pancytopenia. Abdominal pain has resolved and is no longer febrile. He is also having intermittent painless small volume hematochezia since last Friday. CT abd & pelvis showed possible thickening of R colon and sigmoid, ?ischemia vs infectious vs less likely inflammatory. Will  plan to proceed with colonoscopy on 6/28 with possible biopsies. Will obtain differential on CBC and absolute neutrophil count prior to proceeding with colonoscopy   K Denzil Magnuson, MD [phone] Mon-Fri 8a-5p [phone] after 5p, weekends, holidays

## 2016-04-10 NOTE — Progress Notes (Signed)
[facility] Progress Note    Assessment/ Plan:   1 Acute renal allograft rejection, suspected - much less pain on palpation of the transplant situated in the RLQ. - will back to [facility] for elective transplant nephrectomy if the pain returns. Less likely to be rejection if he's already improving with no addition of steroids or MMF or Tac.  2 R/O infection (HD catheter, etc) - cxs NTD. 3 ESRD on HD TTS - Plan for hd in the am. 4 HD catheter - no sign of tunnel tract infection. - will refer as outpatient back to [facility] for a new acceses; would avoid placing an access during this hospitalization. 5 Failed Renal Transplant - not on immunosuppressives.  Subjective:   Interval History: Feels better today. States that the pain over the failed transplant is much better. Myalgias also improving. Denies cough, fever, chills.  Low grade temp overnight.   Objective:   BP 135/82 mmHg  Pulse 78  Temp(Src) 98.2 F (36.8 C) (Oral)  Resp 18  Ht 5' 10"  (1.778 m)  Wt 73.165 kg (161 lb 4.8 oz)  BMI 23.14 kg/m2  SpO2 100%  Intake/Output Summary (Last 24 hours) at 04/10/16 9485 Last data filed at 04/10/16 0920  Gross per 24 hour  Intake    660 ml  Output      0 ml  Net    660 ml   Weight change:   Physical Exam: General appearance: alert and cooperative GI: minimally tender renal allograft Extremities: extremities normal, atraumatic, no cyanosis or edema  TDC RIJ RRR Dry rales on the right  Imaging: Dg Chest 2 View  04/08/2016  CLINICAL DATA:  Blood in stool.  Fever of 109 for 1 day.  Weakness. EXAM: CHEST  2 VIEW COMPARISON:  Chest x-ray dated 12/24/2015. FINDINGS: Right-sided dialysis catheter appears stable in position. Cardiomediastinal silhouette is stable. Heart size is normal. The moderate-sized right pleural effusion appears stable, with probable underlying atelectasis. Left lung remains clear. Osseous structures about the chest are  unremarkable. IMPRESSION: No significant change compared to chest x-ray of 12/24/2015. Moderate-sized right pleural effusion appears stable, with probable chronic underlying atelectasis. No new lung findings. Electronically Signed   By: Franki Cabot M.D.   On: 04/08/2016 12:26   US Renal Transplant W/doppler  04/09/2016  CLINICAL DATA:  Fever and failed right iliac fossa renal transplant. The patient is on hemodialysis. EXAM: ULTRASOUND OF RENAL TRANSPLANT WITH RENAL DOPPLER ULTRASOUND TECHNIQUE: Ultrasound examination of the renal transplant was performed with gray-scale, color and duplex doppler evaluation. COMPARISON:  CT of the abdomen and pelvis earlier today. FINDINGS: Transplant kidney location: RLQ Transplant Kidney: Length: 11.6 cm. Normal in size and parenchymal echogenicity. No evidence of mass or hydronephrosis. No peri-transplant fluid collection seen. Color flow in the main renal artery:  Yes Color flow in the main renal vein:  Yes Duplex Doppler Evaluation: Main Renal Artery Resistive Index: 0.7 Venous waveform in main renal vein:  Present Intrarenal resistive index in upper pole:  0.65 (normal 0.6-0.8; equivocal 0.8-0.9; abnormal >= 0.9) Intrarenal resistive index in lower pole: 0.72 (normal 0.6-0.8; equivocal 0.8-0.9; abnormal >= 0.9) Bladder: Normal for degree of bladder distention. IMPRESSION: Unremarkable sonographic evaluation of the right lower quadrant renal transplant which demonstrates arterial and venous flow without significantly increased resistive indices of sampled arterial flow. No evidence of transplant hydronephrosis or surrounding fluid collections. Electronically Signed   By: Aletta Edouard M.D.   On: 04/09/2016 15:05  Ct Abdomen Pelvis W Contrast  04/09/2016  CLINICAL DATA:  RIGHT lower quadrant abdominal pain yesterday, fever, joint pain, blood in stool for a tonight and Saturday morning, end-stage renal disease on dialysis for 8 months, prior kidney transplant EXAM: CT  ABDOMEN AND PELVIS WITH CONTRAST TECHNIQUE: Multidetector CT imaging of the abdomen and pelvis was performed using the standard protocol following bolus administration of intravenous contrast. Sagittal and coronal MPR images reconstructed from axial data set. CONTRAST:  136m ISOVUE-300 IOPAMIDOL (ISOVUE-300) INJECTION 61% IV. Dilute oral contrast. COMPARISON:  None. FINDINGS: Lower chest: Moderate RIGHT pleural effusion, question partially loculated with compressive atelectasis of RIGHT lower lobe. Hepatobiliary: Tiny focus of calcification at the anterior gallbladder wall. Liver and gallbladder otherwise unremarkable. Pancreas: Normal appearance Spleen: Normal appearance Adrenals/Urinary Tract: Adrenal glands normal appearance. Marked BILATERAL cortical atrophy of native kidneys. Transplant kidney RIGHT iliac fossa without mass or hydronephrosis. Bladder decompressed. Prostatic enlargement gland 5.9 x 4.1 cm image 71. Stomach/Bowel: Appendix is inadequately delineated due to small bowel loops adjacent to cecum. Questionable wall thickening of cecum and proximal ascending as wel as sigmoid colon versus artifacts from underdistention. Slight wall thickening of gastric antrum. Remaining bowel loops unremarkable. Vascular/Lymphatic: Prior arterial grafts at LEFT inguinal/femoral region. Vascular structures otherwise patent. No adenopathy. Reproductive: N/A Other: Small LEFT inguinal hernia containing fat. No free air, free fluid, or mass. Small umbilical hernia containing fat. Musculoskeletal: Osseous structures unremarkable IMPRESSION: Unremarkable transplant kidney RIGHT iliac fossa. Small umbilical and LEFT inguinal hernias containing fat. Moderate RIGHT pleural effusion question partially loculated with compressive atelectasis RIGHT lower lobe. Prostatic enlargement. Unable to exclude wall thickening/colitis at the cecum, proximal ascending and sigmoid colon versus artifact from underdistention ; can correlate  with colonoscopy. Appendix inadequately visualized. Electronically Signed   By: MLavonia DanaM.D.   On: 04/09/2016 11:02   [place]orea[place]al  04/08/2016  CLINICAL DATA:  Abdominal pain, fatigue and chronic kidney disease with history of renal transplantation. EXAM: RENAL / URINARY TRACT ULTRASOUND COMPLETE COMPARISON:  02/14/2015 FINDINGS: Right Kidney: Length: 6.4 cm. Diffusely atrophic native right kidney. No hydronephrosis. No solid masses. Left Kidney: Length: 5.7 cm. Diffusely atrophic left kidney. No masses or hydronephrosis. Transplant kidney (right lower quadrant): Length of 11.6 cm. Echogenicity similar to prior ultrasound with suggestion of slightly increased cortical echogenicity compared to the prior ultrasound. No evidence of hydronephrosis. Formal vascular evaluation of the transplant was not performed. There is blood flow present by color Doppler in the transplant kidney. No abnormal surrounding fluid collections. Bladder: Appears normal for degree of bladder distention. IMPRESSION: The renal transplant no evidence of hydronephrosis. Suggestion of slightly increased cortical echogenicity of the transplanted kidney compared to the prior ultrasound which may implicate some degree of underlying chronic rejection. Electronically Signed   By: GAletta EdouardM.D.   On: 04/08/2016 14:23    Labs: BMET  Recent Labs Lab 04/08/16 1114 04/09/16 0245 04/09/16 1433  NA 133* 135 130*  K 4.8 4.6 4.6  CL 99* 99* 97*  CO2 24 25 27   GLUCOSE 84 93 107*  BUN 23* 13 20  CREATININE 9.88* 6.14* 7.42*  CALCIUM 9.1 9.0 8.6*  PHOS  --  2.3* 3.2   CBC  Recent Labs Lab 04/08/16 1114 04/09/16 0245 04/10/16 0549  WBC 3.6* 4.4 3.7*  HGB 10.0* 9.8* 9.4*  HCT 32.9* 32.2* 30.4*  MCV 76.2* 76.3* 75.6*  PLT 213 192 177    Medications:    . amLODipine  10 mg Oral Daily  .  aspirin EC  81 mg Oral QHS  . [START ON 04/11/2016] calcitRIOL  0.5 mcg Oral Q T,Th,Sa-HD  . calcium-vitamin D  1 tablet Oral QPM  .  carvedilol  25 mg Oral BID  . cinacalcet  30 mg Oral Daily  . ferrous sulfate  325 mg Oral TID WC  . hydrALAZINE  100 mg Oral BID  . lisinopril  40 mg Oral QHS  . piperacillin-tazobactam (ZOSYN)  IV  2.25 g Intravenous Q8H  . sevelamer carbonate  2,400 mg Oral TID WC  . traZODone  50 mg Oral QHS      Otelia Santee, MD 04/10/2016, 9:23 AM

## 2016-04-10 NOTE — Progress Notes (Signed)
Internal Medicine Attending:   I saw and examined the patient. I reviewed the resident's note and I agree with the resident's findings and plan as documented in the resident's note.  50 year old man with end-stage renal disease admitted with hematochezia, fever, and abdominal pain. This was similar to 7 months ago when he was admitted for C. difficile infection. However he has not had any further bowel movements in the last 2 days which makes an active C. difficile infection seem less likely to me. CT of his abdomen and pelvis showed thickening of the cecum consistent with possible colitis. Plan is to consult GI and complete a colonoscopy. He was treated with empiric antibiotics with vancomycin and Zosyn, it's been 48 hours, blood cultures are negative, I would hold these antibiotics for now unless we find another source of infection. Nephrology is still following, transplant rejection is seeming less likely as the source of fever given the quick resolution.

## 2016-04-11 LAB — RENAL FUNCTION PANEL
ANION GAP: 12 (ref 5–15)
Albumin: 2.4 g/dL — ABNORMAL LOW (ref 3.5–5.0)
BUN: 26 mg/dL — ABNORMAL HIGH (ref 6–20)
CALCIUM: 8.6 mg/dL — AB (ref 8.9–10.3)
CHLORIDE: 96 mmol/L — AB (ref 101–111)
CO2: 29 mmol/L (ref 22–32)
Creatinine, Ser: 12.19 mg/dL — ABNORMAL HIGH (ref 0.61–1.24)
GFR, EST AFRICAN AMERICAN: 5 mL/min — AB (ref >60)
GFR, EST NON AFRICAN AMERICAN: 4 mL/min — AB (ref >60)
Glucose, Bld: 87 mg/dL (ref 65–99)
Phosphorus: 4.2 mg/dL (ref 2.5–4.6)
Potassium: 4.4 mmol/L (ref 3.5–5.1)
Sodium: 137 mmol/L (ref 135–145)

## 2016-04-11 LAB — CBC WITH DIFFERENTIAL/PLATELET
BASOS PCT: 0 %
Basophils Absolute: 0 10*3/uL (ref 0.0–0.1)
EOS PCT: 5 %
Eosinophils Absolute: 0.2 10*3/uL (ref 0.0–0.7)
HEMATOCRIT: 30.2 % — AB (ref 39.0–52.0)
HEMOGLOBIN: 9.4 g/dL — AB (ref 13.0–17.0)
LYMPHS PCT: 29 %
Lymphs Abs: 1.3 10*3/uL (ref 0.7–4.0)
MCH: 24.1 pg — AB (ref 26.0–34.0)
MCHC: 31.1 g/dL (ref 30.0–36.0)
MCV: 77.4 fL — AB (ref 78.0–100.0)
MONOS PCT: 14 %
Monocytes Absolute: 0.6 10*3/uL (ref 0.1–1.0)
NEUTROS PCT: 52 %
Neutro Abs: 2.5 10*3/uL (ref 1.7–7.7)
Platelets: 174 10*3/uL (ref 150–400)
RBC: 3.9 MIL/uL — AB (ref 4.22–5.81)
RDW: 24.6 % — ABNORMAL HIGH (ref 11.5–15.5)
WBC: 4.6 10*3/uL (ref 4.0–10.5)

## 2016-04-11 MED ORDER — METOCLOPRAMIDE HCL 5 MG/ML IJ SOLN
10.0000 mg | Freq: Once | INTRAMUSCULAR | Status: AC
  Administered 2016-04-11: 10 mg via INTRAVENOUS
  Filled 2016-04-11: qty 2

## 2016-04-11 MED ORDER — HEPARIN SODIUM (PORCINE) 1000 UNIT/ML IJ SOLN: 2000.0000 [IU] | Freq: Once | INTRAMUSCULAR | Status: AC

## 2016-04-11 MED ORDER — BISACODYL 5 MG PO TBEC
10.0000 mg | DELAYED_RELEASE_TABLET | Freq: Once | ORAL | Status: AC
  Administered 2016-04-11: 10 mg via ORAL
  Filled 2016-04-11: qty 2

## 2016-04-11 MED ORDER — METOCLOPRAMIDE HCL 5 MG/ML IJ SOLN
10.0000 mg | Freq: Once | INTRAMUSCULAR | Status: AC
  Administered 2016-04-12: 10 mg via INTRAVENOUS
  Filled 2016-04-11: qty 2

## 2016-04-11 MED ORDER — PEG-KCL-NACL-NASULF-NA ASC-C 100 G PO SOLR: 0.5000 | Freq: Once | ORAL | Status: DC

## 2016-04-11 MED ORDER — PEG-KCL-NACL-NASULF-NA ASC-C 100 G PO SOLR: 1.0000 | Freq: Once | ORAL | Status: DC

## 2016-04-11 MED ORDER — METOCLOPRAMIDE HCL 5 MG/ML IJ SOLN: 10.0000 mg | Freq: Once | INTRAMUSCULAR | Status: DC

## 2016-04-11 MED ORDER — FERROUS SULFATE 325 (65 FE) MG PO TABS
325.0000 mg | ORAL_TABLET | Freq: Three times a day (TID) | ORAL | Status: DC
  Administered 2016-04-12: 325 mg via ORAL
  Filled 2016-04-11: qty 1

## 2016-04-11 MED ORDER — PEG-KCL-NACL-NASULF-NA ASC-C 100 G PO SOLR
0.5000 | Freq: Once | ORAL | Status: DC
  Filled 2016-04-11: qty 1

## 2016-04-11 MED ORDER — PEG-KCL-NACL-NASULF-NA ASC-C 100 G PO SOLR
0.5000 | Freq: Once | ORAL | Status: AC
  Administered 2016-04-12: 100 g via ORAL

## 2016-04-11 MED ORDER — CALCIUM CARBONATE-VITAMIN D 500-200 MG-UNIT PO TABS: 1.0000 | ORAL_TABLET | Freq: Every evening | ORAL | Status: DC

## 2016-04-11 MED ORDER — SODIUM CHLORIDE 0.9 % IV SOLN
INTRAVENOUS | Status: DC
  Administered 2016-04-11: 12:00:00 via INTRAVENOUS
  Administered 2016-04-12: 1000 mL via INTRAVENOUS

## 2016-04-11 MED ORDER — PEG-KCL-NACL-NASULF-NA ASC-C 100 G PO SOLR
0.5000 | Freq: Once | ORAL | Status: AC
  Administered 2016-04-11: 100 g via ORAL
  Filled 2016-04-11: qty 1

## 2016-04-11 NOTE — Progress Notes (Addendum)
Daily Rounding Note  04/11/2016, 9:57 AM  LOS: 3 days   SUBJECTIVE:       No stools, no bleeding PR, no n/v, no abd pain.  Seen during HD session, feels well.  OBJECTIVE:         Vital signs in last 24 hours:    Temp:  [97.8 F (36.6 C)-98.3 F (36.8 C)] 98.2 F (36.8 C) (06/27 0709) Pulse Rate:  [74-85] 74 (06/27 0930) Resp:  [17-18] 18 (06/27 0709) BP: (117-156)/(75-99) 147/75 mmHg (06/27 0930) SpO2:  [100 %] 100 % (06/27 0709) Weight:  [74.2 kg (163 lb 9.3 oz)-74.299 kg (163 lb 12.8 oz)] 74.2 kg (163 lb 9.3 oz) (06/27 0709) Last BM Date: 04/08/16 Filed Weights   04/10/16 0708 04/11/16 0538 04/11/16 0709  Weight: 73.165 kg (161 lb 4.8 oz) 74.299 kg (163 lb 12.8 oz) 74.2 kg (163 lb 9.3 oz)   General: alert, NAD.  Looks well.     Heart: RRR.  No mrg Chest: clear bil.  No sob or cough Abdomen: soft, ND, NT.  Active BS  Extremities: no CCE Neuro/Psych:  Oriented x 3.  No limb weakness or tremor.    Intake/Output from previous day: 06/26 0701 - 06/27 0700 In: 848 [P.O.:848] Out: 0   Intake/Output this shift:    Lab Results:  Recent Labs  04/10/16 0549 04/10/16 1404 04/11/16 0411  WBC 3.7* 4.9 4.6  HGB 9.4* 9.7* 9.4*  HCT 30.4* 31.1* 30.2*  PLT 177 184 174   BMET  Recent Labs  04/09/16 1433 04/10/16 1404 04/11/16 0718  NA 130* 133* 137  K 4.6 4.5 4.4  CL 97* 96* 96*  CO2 27 27 29   GLUCOSE 107* 114* 87  BUN 20 26* 26*  CREATININE 7.42* 10.48* 12.19*  CALCIUM 8.6* 8.5* 8.6*   LFT  Recent Labs  04/08/16 1114  04/09/16 1433 04/10/16 1404 04/11/16 0718  PROT 7.5  --   --   --   --   ALBUMIN 2.6*  < > 2.5* 2.6* 2.4*  AST 15  --   --   --   --   ALT 8*  --   --   --   --   ALKPHOS 73  --   --   --   --   BILITOT 0.4  --   --   --   --   < > = values in this interval not displayed. PT/INR No results for input(s): LABPROT, INR in the last 72 hours. Hepatitis Panel No results for  input(s): HEPBSAG, HCVAB, HEPAIGM, HEPBIGM in the last 72 hours.  Studies/Results: US Renal Transplant W/doppler  04/09/2016  CLINICAL DATA:  Fever and failed right iliac fossa renal transplant. The patient is on hemodialysis. EXAM: ULTRASOUND OF RENAL TRANSPLANT WITH RENAL DOPPLER ULTRASOUND TECHNIQUE: Ultrasound examination of the renal transplant was performed with gray-scale, color and duplex doppler evaluation. COMPARISON:  CT of the abdomen and pelvis earlier today. FINDINGS: Transplant kidney location: RLQ Transplant Kidney: Length: 11.6 cm. Normal in size and parenchymal echogenicity. No evidence of mass or hydronephrosis. No peri-transplant fluid collection seen. Color flow in the main renal artery:  Yes Color flow in the main renal vein:  Yes Duplex Doppler Evaluation: Main Renal Artery Resistive Index: 0.7 Venous waveform in main renal vein:  Present Intrarenal resistive index in upper pole:  0.65 (normal 0.6-0.8; equivocal 0.8-0.9; abnormal >= 0.9) Intrarenal resistive index in lower pole: 0.72 (  normal 0.6-0.8; equivocal 0.8-0.9; abnormal >= 0.9) Bladder: Normal for degree of bladder distention. IMPRESSION: Unremarkable sonographic evaluation of the right lower quadrant renal transplant which demonstrates arterial and venous flow without significantly increased resistive indices of sampled arterial flow. No evidence of transplant hydronephrosis or surrounding fluid collections. Electronically Signed   By: Aletta Edouard M.D.   On: 04/09/2016 15:05    ASSESMENT:   * Painless (caveat: RLQ tenderness) hematochezia. ? Colitis in right colon and sigmoid per CT. ? Ischemic colitis despite his being hypertensive at ED arrival, ? Infectious given fever/body aches?  * ESRD. DD renal transplant 2001, failed and started HD again in 07/2015  *  Anemia (microcytic) of CKD: scheduled Mircera and previous parenteral iron and transfusions. Pancytopenic in 10/2015.    PLAN   *  Colonoscopy on 6/28  at 1100.  Prep orders in place.     Jonathon Conley  04/11/2016, 9:57 AM Pager: [phone]

## 2016-04-11 NOTE — Progress Notes (Signed)
Patient was seen on dialysis and the procedure was supervised @830am .   BFR 300 Via RIJ cath  BP is 135/65.  AP -70   VP -110 UF 1.3-1.5L as tolerated --> no complaints as of now. Bath 2/2.25  Patient appears to be tolerating treatment well. Pain continues to improve over the site of the transplanted kidney despite not starting immunosuppressants. No further temps over 24hrs.  Assessment/ Plan:   1 Acute renal allograft rejection, suspected - much less pain on palpation of the transplant situated in the RLQ. - will back to [facility] for elective transplant nephrectomy if the pain returns. Less likely to be rejection if he's already improving with no addition of steroids or MMF or Tac.  2 R/O infection (HD catheter, etc) - cxs NTD. 3 ESRD on HD TTS - Seen on HD this am. 4 HD catheter - no sign of tunnel tract infection. - will refer as outpatient back to [facility] for a new acceses; would avoid placing an access during this hospitalization. 5 Failed Renal Transplant - not on immunosuppressives.       Otelia Santee, MD 04/11/2016, 8:38 AM

## 2016-04-11 NOTE — Progress Notes (Signed)
Subjective: Patient seen on hemodialysis, feeling slightly fatigued but otherwise well. No further fevers or chills. He has had no bowel movements since his hematochezia 3 days ago. His appetite is somewhat poor during this hospitalization.  Objective: Vital signs in last 24 hours: Filed Vitals:   04/11/16 0730 04/11/16 0800 04/11/16 0831 04/11/16 0859  BP: 140/99 136/84 133/89 117/84  Pulse: 80 78 83 77  Temp:      TempSrc:      Resp:      Height:      Weight:      SpO2:       Weight change:   Intake/Output Summary (Last 24 hours) at 04/11/16 0859 Last data filed at 04/11/16 0000  Gross per 24 hour  Intake    848 ml  Output      0 ml  Net    848 ml   GENERAL- NAD, on HD through RIJ tunneled catheter without acute complaint CARDIAC- RRR, no murmurs, rubs or gallops RESP- Anterior lung fields clear ABDOMEN- soft, RLQ only minimally TTP  Lab Results: Basic Metabolic Panel:  Recent Labs Lab 04/10/16 1404 04/11/16 0718  NA 133* 137  K 4.5 4.4  CL 96* 96*  CO2 27 29  GLUCOSE 114* 87  BUN 26* 26*  CREATININE 10.48* 12.19*  CALCIUM 8.5* 8.6*  PHOS 3.4 4.2   Liver Function Tests:  Recent Labs Lab 04/08/16 1114  04/10/16 1404 04/11/16 0718  AST 15  --   --   --   ALT 8*  --   --   --   ALKPHOS 73  --   --   --   BILITOT 0.4  --   --   --   PROT 7.5  --   --   --   ALBUMIN 2.6*  < > 2.6* 2.4*  < > = values in this interval not displayed. CBC:  Recent Labs Lab 04/10/16 1404 04/11/16 0411  WBC 4.9 4.6  NEUTROABS  --  2.5  HGB 9.7* 9.4*  HCT 31.1* 30.2*  MCV 74.9* 77.4*  PLT 184 174    Micro Results: Recent Results (from the past 240 hour(s))  Culture, blood (routine x 2)     Status: None (Preliminary result)   Collection Time: 04/08/16 12:36 PM  Result Value Ref Range Status   Specimen Description BLOOD LEFT HAND  Final   Special Requests IN PEDIATRIC BOTTLE 3CC  Final   Culture NO GROWTH 2 DAYS  Final   Report Status PENDING  Incomplete    Culture, blood (routine x 2)     Status: None (Preliminary result)   Collection Time: 04/08/16 12:40 PM  Result Value Ref Range Status   Specimen Description LEFT ANTECUBITAL  Final   Special Requests BOTTLES DRAWN AEROBIC AND ANAEROBIC 5CC  Final   Culture NO GROWTH 2 DAYS  Final   Report Status PENDING  Incomplete  MRSA PCR Screening     Status: None   Collection Time: 04/09/16  5:17 AM  Result Value Ref Range Status   MRSA by PCR NEGATIVE NEGATIVE Final    Comment:        The GeneXpert MRSA Assay (FDA approved for NASAL specimens only), is one component of a comprehensive MRSA colonization surveillance program. It is not intended to diagnose MRSA infection nor to guide or monitor treatment for MRSA infections.    Studies/Results: US Renal Transplant W/doppler  04/09/2016  CLINICAL DATA:  Fever and failed right  iliac fossa renal transplant. The patient is on hemodialysis. EXAM: ULTRASOUND OF RENAL TRANSPLANT WITH RENAL DOPPLER ULTRASOUND TECHNIQUE: Ultrasound examination of the renal transplant was performed with gray-scale, color and duplex doppler evaluation. COMPARISON:  CT of the abdomen and pelvis earlier today. FINDINGS: Transplant kidney location: RLQ Transplant Kidney: Length: 11.6 cm. Normal in size and parenchymal echogenicity. No evidence of mass or hydronephrosis. No peri-transplant fluid collection seen. Color flow in the main renal artery:  Yes Color flow in the main renal vein:  Yes Duplex Doppler Evaluation: Main Renal Artery Resistive Index: 0.7 Venous waveform in main renal vein:  Present Intrarenal resistive index in upper pole:  0.65 (normal 0.6-0.8; equivocal 0.8-0.9; abnormal >= 0.9) Intrarenal resistive index in lower pole: 0.72 (normal 0.6-0.8; equivocal 0.8-0.9; abnormal >= 0.9) Bladder: Normal for degree of bladder distention. IMPRESSION: Unremarkable sonographic evaluation of the right lower quadrant renal transplant which demonstrates arterial and venous flow  without significantly increased resistive indices of sampled arterial flow. No evidence of transplant hydronephrosis or surrounding fluid collections. Electronically Signed   By: Aletta Edouard M.D.   On: 04/09/2016 15:05   Ct Abdomen Pelvis W Contrast  04/09/2016  CLINICAL DATA:  RIGHT lower quadrant abdominal pain yesterday, fever, joint pain, blood in stool for a tonight and Saturday morning, end-stage renal disease on dialysis for 8 months, prior kidney transplant EXAM: CT ABDOMEN AND PELVIS WITH CONTRAST TECHNIQUE: Multidetector CT imaging of the abdomen and pelvis was performed using the standard protocol following bolus administration of intravenous contrast. Sagittal and coronal MPR images reconstructed from axial data set. CONTRAST:  136m ISOVUE-300 IOPAMIDOL (ISOVUE-300) INJECTION 61% IV. Dilute oral contrast. COMPARISON:  None. FINDINGS: Lower chest: Moderate RIGHT pleural effusion, question partially loculated with compressive atelectasis of RIGHT lower lobe. Hepatobiliary: Tiny focus of calcification at the anterior gallbladder wall. Liver and gallbladder otherwise unremarkable. Pancreas: Normal appearance Spleen: Normal appearance Adrenals/Urinary Tract: Adrenal glands normal appearance. Marked BILATERAL cortical atrophy of native kidneys. Transplant kidney RIGHT iliac fossa without mass or hydronephrosis. Bladder decompressed. Prostatic enlargement gland 5.9 x 4.1 cm image 71. Stomach/Bowel: Appendix is inadequately delineated due to small bowel loops adjacent to cecum. Questionable wall thickening of cecum and proximal ascending as wel as sigmoid colon versus artifacts from underdistention. Slight wall thickening of gastric antrum. Remaining bowel loops unremarkable. Vascular/Lymphatic: Prior arterial grafts at LEFT inguinal/femoral region. Vascular structures otherwise patent. No adenopathy. Reproductive: N/A Other: Small LEFT inguinal hernia containing fat. No free air, free fluid, or mass.  Small umbilical hernia containing fat. Musculoskeletal: Osseous structures unremarkable IMPRESSION: Unremarkable transplant kidney RIGHT iliac fossa. Small umbilical and LEFT inguinal hernias containing fat. Moderate RIGHT pleural effusion question partially loculated with compressive atelectasis RIGHT lower lobe. Prostatic enlargement. Unable to exclude wall thickening/colitis at the cecum, proximal ascending and sigmoid colon versus artifact from underdistention ; can correlate with colonoscopy. Appendix inadequately visualized. Electronically Signed   By: MLavonia DanaM.D.   On: 04/09/2016 11:02   Medications: I have reviewed the patient's current medications. Scheduled Meds: . amLODipine  10 mg Oral Daily  . aspirin EC  81 mg Oral QHS  . calcitRIOL  0.5 mcg Oral Q T,Th,Sa-HD  . calcium-vitamin D  1 tablet Oral QPM  . carvedilol  25 mg Oral BID  . ferrous sulfate  325 mg Oral TID WC  . hydrALAZINE  100 mg Oral BID  . lisinopril  40 mg Oral QHS  . sevelamer carbonate  2,400 mg Oral TID WC  . traZODone  50 mg Oral QHS   Continuous Infusions:  PRN Meds:.sodium chloride, sodium chloride, alteplase, heparin, heparin, lidocaine (PF), lidocaine-prilocaine, pentafluoroprop-tetrafluoroeth, traMADol Assessment/Plan: Hematochezia: 3 episodes of painless BRBPR Friday night/Saturday morning before presentation to ED. Hgb is stable with no subsequent bowel movements and patient has been normal to hypertensive during this admission. He is not having diarrhea but given systemic symptoms and fevers remain suspicious for infectious process or inflammatory disease. He was seen yesterday by GI service for evaluation of this suspected colitis on abdominal CT, with plans for endoscopic workup. CBC with differential checked prior to colonscopy showing appropriate absolute cell line counts. White count WNL today up from 3.6 on admission. - Bowel preparation with colonoscopy tomorrow - GI service consulted,  recommendations appreciated  Fever: Resolved now, discontinued empiric antibiotics given resolution of his fever with stable vital signs. - blood Cx (6/24) NGTD - Discontinued empiric vanc/zosyn IV - If culture positive will need line holiday and clearance  ESRD on HD: S/p renal transplant for glomerulonephritis ~15 years ago with acute on chronic failure last year. Currently on TTS HD with a RIJ vasocath for access. -Nephrology following, recommendations appreciated -Renal function panel  Hypertension: Normotensive on HD and home regimen: Amlodipine 42m, Coreg 263mBID, Hydralazine 10055mID, lisinopril 16m24memia of chronic disease: Stable. Continue ferrous sulfate 325mg58monic R pleural effusion: pleural effusion stable from prior imaging this admission, [facility], plan for eventual pleurodesis in the future given persistence and rapid reaccumulation  FULL CODE Diet: Clear liquids VTE ppx: SCDs  Dispo: Disposition is deferred at this time, awaiting improvement and workup of current medical problems.  The patient does have a current PCP (OlugbTresa Garter and [facility]  The patient does not have transportation limitations that hinder transportation to clinic appointments.    LOS: 3 days   ChrisCollier Salina6/27/2017, 8:59 AM

## 2016-04-11 NOTE — Progress Notes (Signed)
Internal Medicine Attending:   I saw and examined the patient. I reviewed the resident's note and I agree with the resident's findings and plan as documented in the resident's note.  50 year old man with end-stage renal disease admitted with fever, abdominal pain, and hematochezia. CT abdomen showed thickening of the cecum consistent with possible colitis. Plan is for colonoscopy tomorrow. Patient is doing well today, we saw him while in hemodialysis which she is tolerating. No fever since admission. No bowel movements since admission. Eating and drinking well. We discontinued empiric anabiotic yesterday, blood cultures remain no growth. We will see what we find on the colonoscopy as I'm suspicious he has either infectious or inflammatory colitis.

## 2016-04-12 ENCOUNTER — Inpatient Hospital Stay (HOSPITAL_COMMUNITY): Payer: Medicare Other | Admitting: Certified Registered Nurse Anesthetist

## 2016-04-12 ENCOUNTER — Encounter (HOSPITAL_COMMUNITY)
Admission: EM | Disposition: A | Discharge status: Home / Self Care | Payer: Self-pay | Source: Home / Self Care | Attending: Student in an Organized Health Care Education/Training Program

## 2016-04-12 ENCOUNTER — Encounter (HOSPITAL_COMMUNITY): Payer: Self-pay

## 2016-04-12 DIAGNOSIS — K5790 Diverticulosis of intestine, part unspecified, without perforation or abscess without bleeding: Secondary | ICD-10-CM

## 2016-04-12 DIAGNOSIS — K648 Other hemorrhoids: Principal | ICD-10-CM

## 2016-04-12 HISTORY — DX: Diverticulosis of intestine, part unspecified, without perforation or abscess without bleeding: K57.90

## 2016-04-12 HISTORY — PX: HOT HEMOSTASIS: SHX5433

## 2016-04-12 HISTORY — PX: COLONOSCOPY: SHX5424

## 2016-04-12 SURGERY — COLONOSCOPY
Anesthesia: Monitor Anesthesia Care

## 2016-04-12 MED ORDER — HYDROCORTISONE ACETATE 25 MG RE SUPP: 25.0000 mg | Freq: Two times a day (BID) | RECTAL | Status: DC

## 2016-04-12 MED ORDER — STERILE WATER FOR IRRIGATION IR SOLN
Status: DC | PRN
  Administered 2016-04-12: 5 mL

## 2016-04-12 MED ORDER — PROPOFOL 500 MG/50ML IV EMUL
INTRAVENOUS | Status: DC | PRN
  Administered 2016-04-12: 75 ug/kg/min via INTRAVENOUS
  Administered 2016-04-12: 11:00:00 via INTRAVENOUS

## 2016-04-12 MED ORDER — SODIUM CHLORIDE 0.9 % IV SOLN
INTRAVENOUS | Status: DC | PRN
  Administered 2016-04-12: 10:00:00 via INTRAVENOUS

## 2016-04-12 MED ORDER — PROPOFOL 10 MG/ML IV BOLUS
INTRAVENOUS | Status: DC | PRN
  Administered 2016-04-12 (×3): 10 mg via INTRAVENOUS

## 2016-04-12 MED ORDER — HYDROCORTISONE ACETATE 25 MG RE SUPP
25.0000 mg | Freq: Two times a day (BID) | RECTAL | Status: DC
  Filled 2016-04-12 (×2): qty 1

## 2016-04-12 MED ORDER — LIDOCAINE HCL (CARDIAC) 20 MG/ML IV SOLN
INTRAVENOUS | Status: DC | PRN
Start: 2016-04-12 — End: 2016-04-12
  Administered 2016-04-12: 20 mg via INTRATRACHEAL

## 2016-04-12 NOTE — Interval H&P Note (Signed)
History and Physical Interval Note:  04/12/2016 9:51 AM  Jonathon Conley  has presented today for surgery, with the diagnosis of hematochezia.  The various methods of treatment have been discussed with the patient and family. After consideration of risks, benefits and other options for treatment, the patient has consented to  Procedure(s): COLONOSCOPY (N/A) as a surgical intervention .  The patient's history has been reviewed, patient examined, no change in status, stable for surgery.  I have reviewed the patient's chart and labs.  Questions were answered to the patient's satisfaction.     Kavitha Nandigam

## 2016-04-12 NOTE — Transfer of Care (Signed)
Immediate Anesthesia Transfer of Care Note  Patient: Jonathon Conley  Procedure(s) Performed: Procedure(s): COLONOSCOPY (N/A) HOT HEMOSTASIS (ARGON PLASMA COAGULATION/BICAP) (N/A)  Patient Location: Endoscopy Unit  Anesthesia Type:MAC  Level of Consciousness: awake and responds to stimulation  Airway & Oxygen Therapy: Patient Spontanous Breathing and Patient connected to nasal cannula oxygen  Post-op Assessment: Report given to RN and Post -op Vital signs reviewed and stable  Post vital signs: Reviewed and stable  Last Vitals:  Filed Vitals:   04/12/16 0449 04/12/16 0933  BP: 139/64 155/79  Pulse: 91 90  Temp: 37.2 C 37.2 C  Resp: 18 20    Last Pain:  Filed Vitals:   04/12/16 0935  PainSc: 0-No pain      Patients Stated Pain Goal: 1 (06/84/03 3533)  Complications: No apparent anesthesia complications

## 2016-04-12 NOTE — Progress Notes (Signed)
Internal Medicine Attending:   I saw and examined the patient. I reviewed the resident's note and I agree with the resident's findings and plan as documented in the resident's note.  50 year old with ESRD admitted for fever, hematochezia, and abdominal pain. Examined him on rounds this morning and he was feeling well with no complaints, abdominal pain had resolved, eating and drinking well, afebrile since admission and cultures are no growth to date. Colonoscopy completed today and showed bleeding internal hemorrhoids, mild diverticula, and several areas of patchy moderate erythema in the rectum and sigmoid colon. The hemorrhoids were treated with argon plasma, several areas of the inflammation were biopsied. Perhaps the hemorrhoids are the cause of his hematochezia, but I doubt they caused his fever and abdominal pain on presentation. We'll have to wait for the histology, but perhaps this raises the possibility of inflammatory bowel disease like ulcerative colitis. We'll check in with the patient later today, if he recovers well from the colonoscopy, is eating and drinking, he may be ready to discharge later this evening and then resume his normal HD schedule tomorrow.

## 2016-04-12 NOTE — H&P (View-Only) (Signed)
Daily Rounding Note  04/11/2016, 9:57 AM  LOS: 3 days   SUBJECTIVE:       No stools, no bleeding PR, no n/v, no abd pain.  Seen during HD session, feels well.  OBJECTIVE:         Vital signs in last 24 hours:    Temp:  [97.8 F (36.6 C)-98.3 F (36.8 C)] 98.2 F (36.8 C) (06/27 0709) Pulse Rate:  [74-85] 74 (06/27 0930) Resp:  [17-18] 18 (06/27 0709) BP: (117-156)/(75-99) 147/75 mmHg (06/27 0930) SpO2:  [100 %] 100 % (06/27 0709) Weight:  [74.2 kg (163 lb 9.3 oz)-74.299 kg (163 lb 12.8 oz)] 74.2 kg (163 lb 9.3 oz) (06/27 0709) Last BM Date: 04/08/16 Filed Weights   04/10/16 0708 04/11/16 0538 04/11/16 0709  Weight: 73.165 kg (161 lb 4.8 oz) 74.299 kg (163 lb 12.8 oz) 74.2 kg (163 lb 9.3 oz)   General: alert, NAD.  Looks well.     Heart: RRR.  No mrg Chest: clear bil.  No sob or cough Abdomen: soft, ND, NT.  Active BS  Extremities: no CCE Neuro/Psych:  Oriented x 3.  No limb weakness or tremor.    Intake/Output from previous day: 06/26 0701 - 06/27 0700 In: 848 [P.O.:848] Out: 0   Intake/Output this shift:    Lab Results:  Recent Labs  04/10/16 0549 04/10/16 1404 04/11/16 0411  WBC 3.7* 4.9 4.6  HGB 9.4* 9.7* 9.4*  HCT 30.4* 31.1* 30.2*  PLT 177 184 174   BMET  Recent Labs  04/09/16 1433 04/10/16 1404 04/11/16 0718  NA 130* 133* 137  K 4.6 4.5 4.4  CL 97* 96* 96*  CO2 27 27 29   GLUCOSE 107* 114* 87  BUN 20 26* 26*  CREATININE 7.42* 10.48* 12.19*  CALCIUM 8.6* 8.5* 8.6*   LFT  Recent Labs  04/08/16 1114  04/09/16 1433 04/10/16 1404 04/11/16 0718  PROT 7.5  --   --   --   --   ALBUMIN 2.6*  < > 2.5* 2.6* 2.4*  AST 15  --   --   --   --   ALT 8*  --   --   --   --   ALKPHOS 73  --   --   --   --   BILITOT 0.4  --   --   --   --   < > = values in this interval not displayed. PT/INR No results for input(s): LABPROT, INR in the last 72 hours. Hepatitis Panel No results for  input(s): HEPBSAG, HCVAB, HEPAIGM, HEPBIGM in the last 72 hours.  Studies/Results: US Renal Transplant W/doppler  04/09/2016  CLINICAL DATA:  Fever and failed right iliac fossa renal transplant. The patient is on hemodialysis. EXAM: ULTRASOUND OF RENAL TRANSPLANT WITH RENAL DOPPLER ULTRASOUND TECHNIQUE: Ultrasound examination of the renal transplant was performed with gray-scale, color and duplex doppler evaluation. COMPARISON:  CT of the abdomen and pelvis earlier today. FINDINGS: Transplant kidney location: RLQ Transplant Kidney: Length: 11.6 cm. Normal in size and parenchymal echogenicity. No evidence of mass or hydronephrosis. No peri-transplant fluid collection seen. Color flow in the main renal artery:  Yes Color flow in the main renal vein:  Yes Duplex Doppler Evaluation: Main Renal Artery Resistive Index: 0.7 Venous waveform in main renal vein:  Present Intrarenal resistive index in upper pole:  0.65 (normal 0.6-0.8; equivocal 0.8-0.9; abnormal >= 0.9) Intrarenal resistive index in lower pole: 0.72 (  normal 0.6-0.8; equivocal 0.8-0.9; abnormal >= 0.9) Bladder: Normal for degree of bladder distention. IMPRESSION: Unremarkable sonographic evaluation of the right lower quadrant renal transplant which demonstrates arterial and venous flow without significantly increased resistive indices of sampled arterial flow. No evidence of transplant hydronephrosis or surrounding fluid collections. Electronically Signed   By: Aletta Edouard M.D.   On: 04/09/2016 15:05    ASSESMENT:   * Painless (caveat: RLQ tenderness) hematochezia. ? Colitis in right colon and sigmoid per CT. ? Ischemic colitis despite his being hypertensive at ED arrival, ? Infectious given fever/body aches?  * ESRD. DD renal transplant 2001, failed and started HD again in 07/2015  *  Anemia (microcytic) of CKD: scheduled Mircera and previous parenteral iron and transfusions. Pancytopenic in 10/2015.    PLAN   *  Colonoscopy on 6/28  at 1100.  Prep orders in place.     Jonathon Conley  04/11/2016, 9:57 AM Pager: [phone]

## 2016-04-12 NOTE — Progress Notes (Signed)
  [facility] Progress Note    Assessment/ Plan:   1 Acute renal allograft rejection, suspected - much less pain on palpation of the transplant situated in the RLQ. - will back to [facility] for elective transplant nephrectomy if the pain returns. Less likely to be rejection if he's already improving with no addition of steroids or MMF or Tac.  2 R/O infection (HD catheter, etc) - cxs NTD. 3 ESRD on HD TTS - Plan for hd in the am if he's still here. O/w he can go to his HD unit in [place]. 4 HD catheter - no sign of tunnel tract infection. - will refer as outpatient [facility] to [facility] for a new acceses; would avoid placing an access during this hospitalization. 5 Failed Renal Transplant - not on immunosuppressives.  Subjective:   Interval History: Feels better today. States that the pain over the failed transplant is much better. Myalgias also resolved. Denies cough, fever, chills.  No complaints.   Objective:   BP 145/89 mmHg  Pulse 74  Temp(Src) 98.9 F (37.2 C) (Oral)  Resp 13  Ht 5' 10"  (1.778 m)  Wt 72.576 kg (160 lb)  BMI 22.96 kg/m2  SpO2 100%  Intake/Output Summary (Last 24 hours) at 04/12/16 1405 Last data filed at 04/12/16 1115  Gross per 24 hour  Intake 2351.67 ml  Output      0 ml  Net 2351.67 ml   Weight change: 1.035 kg (2 lb 4.5 oz)  Physical Exam: General appearance: alert and cooperative GI: minimally tender renal allograft Extremities: extremities normal, atraumatic, no cyanosis or edema  TDC RIJ RRR Dry rales on the right  Imaging: No results found.  Labs: BMET  Recent Labs Lab 04/08/16 1114 04/09/16 0245 04/09/16 1433 04/10/16 1404 04/11/16 0718  NA 133* 135 130* 133* 137  K 4.8 4.6 4.6 4.5 4.4  CL 99* 99* 97* 96* 96*  CO2 24 25 27 27 29   GLUCOSE 84 93 107* 114* 87  BUN 23* 13 20 26* 26*  CREATININE 9.88* 6.14* 7.42* 10.48* 12.19*  CALCIUM 9.1 9.0 8.6* 8.5* 8.6*  PHOS  --  2.3* 3.2 3.4 4.2    CBC  Recent Labs Lab 04/09/16 0245 04/10/16 0549 04/10/16 1404 04/11/16 0411  WBC 4.4 3.7* 4.9 4.6  NEUTROABS  --   --   --  2.5  HGB 9.8* 9.4* 9.7* 9.4*  HCT 32.2* 30.4* 31.1* 30.2*  MCV 76.3* 75.6* 74.9* 77.4*  PLT 192 177 184 174    Medications:    . amLODipine  10 mg Oral Daily  . aspirin EC  81 mg Oral QHS  . calcitRIOL  0.5 mcg Oral Q T,Th,Sa-HD  . calcium-vitamin D  1 tablet Oral QPM  . carvedilol  25 mg Oral BID  . [START ON 04/13/2016] ferrous sulfate  325 mg Oral TID WC  . hydrALAZINE  100 mg Oral BID  . hydrocortisone  25 mg Rectal BID  . lisinopril  40 mg Oral QHS  . sevelamer carbonate  2,400 mg Oral TID WC  . traZODone  50 mg Oral QHS      Otelia Santee, MD 04/12/2016, 2:05 PM

## 2016-04-12 NOTE — Progress Notes (Signed)
Discharge instructions given to patient and spouse and both verbalized understanding

## 2016-04-12 NOTE — Discharge Summary (Signed)
Name: Jonathon Conley MRN: [id number] DOB: 1966/09/01 50 y.o. PCP: Tresa Garter, MD  Date of Admission: 04/08/2016 11:18 AM Date of Discharge: 04/12/2016 Attending Physician: Axel Filler, MD  Discharge Diagnosis: Principal Problem:   Hematochezia Active Problems:   OSA (obstructive sleep apnea)   ESRD on dialysis ([place])   Chronic anemia   H/O kidney transplant   HTN (hypertension)   Intravenous line infection ([place])   Discharge Medications:   Medication List    STOP taking these medications        cinacalcet 30 MG tablet  Commonly known as:  SENSIPAR     furosemide 80 MG tablet  Commonly known as:  LASIX     predniSONE 5 MG tablet  Commonly known as:  DELTASONE      TAKE these medications        amLODipine 10 MG tablet  Commonly known as:  NORVASC  Take 10 mg by mouth daily.     aspirin EC 81 MG tablet  Take 81 mg by mouth at bedtime.     calcitRIOL 0.5 MCG capsule  Commonly known as:  ROCALTROL  Take 1 capsule (0.5 mcg total) by mouth Every Tuesday,Thursday,and Saturday with dialysis.     CALCIUM 600+D 600-400 MG-UNIT tablet  Generic drug:  Calcium Carbonate-Vitamin D  Take 1 tablet by mouth every evening. Reported on 04/09/2016     carvedilol 25 MG tablet  Commonly known as:  COREG  Take 25 mg by mouth 2 (two) times daily.     ferrous sulfate 325 (65 FE) MG tablet  Take 325 mg by mouth 3 (three) times daily with meals. Reported on 04/09/2016     hydrALAZINE 50 MG tablet  Commonly known as:  APRESOLINE  Take 100 mg by mouth 2 (two) times daily. Reported on 04/09/2016     hydrocortisone 25 MG suppository  Commonly known as:  ANUSOL-HC  Place 1 suppository (25 mg total) rectally 2 (two) times daily.     lisinopril 40 MG tablet  Commonly known as:  PRINIVIL,ZESTRIL  Take 1 tablet (40 mg total) by mouth at bedtime.     sevelamer carbonate 800 MG tablet  Commonly known as:  RENVELA  Take 2,400 mg by mouth 3 (three) times daily with meals.  Reported on 04/09/2016     simvastatin 5 MG tablet  Commonly known as:  ZOCOR  Take 1 tablet (5 mg total) by mouth at bedtime.        Disposition and follow-up:   Mr.Jonathon Conley was discharged from [facility] in Good condition.  At the hospital follow up visit please address:  1.  Hematochezia: Patient admitted after 3 episodes of painless bright red blood in bowel movements due to a bleeding internal hemorrhoid seen on colonoscopy. He also had several erythematous lesions biopsied with pending pathology report, suspected infectious versus inflammatory disease that was most likely his cause of low grade fever.  2.  Hypertension: Patient reported being off all his prescribed antihypertensives prior to admission. Given his past complications from this process and highly elevated blood pressures he needs to follow up with general medicine clinic for medication review or refill.  Follow-up Appointments: Follow-up Information    Follow up with [facility] On 05/09/2016.   Specialty:  Internal Medicine   Why:  At 8:30am with dr. Doreene Burke for hospital follow up   Contact information:   [place] [phone]  Admission HPI: 50 y/o man with PMHx of renal transplant s/p chronic allograft rejection 2016 now on HD, HTN, OSA, recurrent R pleural effusion presents to the ED today after 3 episodes of bright red blood in his bowel movements last night and this morning. These were painless. He does not recall having blood in his stool any other times except shortly after his renal transplant. He also feels that he has had sweating and chills since yesterday that increased this morning. He felt generalized weakness and presented to the ED this morning instead of attending his regular hemodialysis today. He does endorse mild tenderness over his RLQ without radiation otherwise no abdominal pain. He denies any preceding diarrhea or  nausea. He has a nonproductive cough that is chronic but no congestion or chest pain. He feels he has a headache without vision changes. He also feels there is more swelling in his hands and knees than usual but is not significantly above his usual weight.  After arrival in the ED he was noted to have fever of 100.4 WBC, of 3.6, and Hgb of 10.0. He did not take any of his antihypertensives today and did not attend HD so is hypertensive to 190s-200s SBP.  He attends HD on TTS [facility] through a R chest vasocath ever since failure of his renal transplant last year. He reports completing dialysis on Thursday without difficulty. He is on the transplant list at [facility].   Hospital Course by problem list: Hematochezia: Patient had no further bloody bowel movements after admission. He did continue to have mild RLQ pain that continued with improvement by hospital day 2. C. Difficile PCR was deferred due to no subsequent bowel movements. Abdominal CT was obtained which demonstrated possible colitis at the cecum as well as sigmoid colon. Based on these findings he underwent bowel prep which produced small amounts of blood then underwent colonoscopy. Procedure noted a bleeding internal hemorrhoid treated with argon plasma but also noted diverticulosis in sigmoid colon and erythematous lesions in the sigmoid colon and rectum that were biopsied for pathology evaluation. The procedure was tolerated well without complication and he was eating a regular diet again prior to discharge later that day.  ESRD on HD: Presented without significant metabolic derangement but was somewhat hypertensive. Nephrology was consulted for dialysis inpatient. He had symptomatic chills during this session but otherwise tolerated without difficulty. He had no subsequent problems continuing regular hemodialysis.  Fever: Empiric antibiotics were started with vancomycin and zosyn with most likely suspicion of  line infection. Blood cultures were drawn which remained negative. He did not have recurrent measured fevers overnight but continued to have sweating and chills for one night after admission. Empiric antibiotics were discontinued after 3 days.  Recurrent right pleural effusion: Redemonstrated on CXR and base on abdominal CT that is not significantly changed from March evaluation of the same problem. He did not develop hypoxia or any localized symptoms besides and intermittent dry cough.  Discharge Vitals:   BP 145/89 mmHg  Pulse 74  Temp(Src) 98.9 F (37.2 C) (Oral)  Resp 13  Ht 5' 10"  (1.778 m)  Wt 72.576 kg (160 lb)  BMI 22.96 kg/m2  SpO2 100%  Pertinent Labs, Studies, and Procedures:  Colonoscopy performed 04/12/16 Summary of impressions: - Diverticulosis in the sigmoid colon. - Erythematous mucosa in the sigmoid colon. Biopsied. - Erythematous mucosa in the rectum. Biopsied. - Bleeding internal hemorrhoids. Treated with argon plasma coagulation (APC).   Discharge Instructions: Continue anusol  x7 days, resume other home meds   Signed: Collier Salina, MD 04/13/2016, 9:10 PM

## 2016-04-12 NOTE — Anesthesia Preprocedure Evaluation (Signed)
Anesthesia Evaluation  Patient identified by MRN, date of birth, ID band Patient awake    Reviewed: Allergy & Precautions, NPO status , Patient's Chart, lab work & pertinent test results  Airway Mallampati: II   Neck ROM: full    Dental   Pulmonary shortness of breath, sleep apnea ,    breath sounds clear to auscultation       Cardiovascular hypertension, +CHF   Rhythm:regular Rate:Normal     Neuro/Psych    GI/Hepatic   Endo/Other    Renal/GU ESRF and DialysisRenal disease     Musculoskeletal   Abdominal   Peds  Hematology  (+) anemia ,   Anesthesia Other Findings   Reproductive/Obstetrics                             Anesthesia Physical Anesthesia Plan  ASA: III  Anesthesia Plan: MAC   Post-op Pain Management:    Induction: Intravenous  Airway Management Planned: Simple Face Mask  Additional Equipment:   Intra-op Plan:   Post-operative Plan:   Informed Consent: I have reviewed the patients History and Physical, chart, labs and discussed the procedure including the risks, benefits and alternatives for the proposed anesthesia with the patient or authorized representative who has indicated his/her understanding and acceptance.     Plan Discussed with: CRNA, Anesthesiologist and Surgeon  Anesthesia Plan Comments:         Anesthesia Quick Evaluation

## 2016-04-12 NOTE — Care Management Important Message (Signed)
Important Message  Patient Details  Name: Jonathon Conley MRN: [id number] Date of Birth: [birth year]   Medicare Important Message Given:  Yes    Loann Quill 04/12/2016, 10:03 AM

## 2016-04-12 NOTE — Anesthesia Procedure Notes (Signed)
Procedure Name: MAC Performed by: Tressia Miners LEFFEW Pre-anesthesia Checklist: Patient identified, Emergency Drugs available, Suction available, Timeout performed and Patient being monitored Patient Re-evaluated:Patient Re-evaluated prior to inductionOxygen Delivery Method: Nasal cannula Placement Confirmation: positive ETCO2

## 2016-04-12 NOTE — Op Note (Signed)
[facility] Patient Name: Jonathon Conley Procedure Date : 04/12/2016 MRN: [id number] Attending MD: Mauri Pole , MD Date of Birth: May 25, 1966 CSN: [id number] Age: 50 Admit Type: Inpatient Procedure:                Colonoscopy Indications:              Evaluation on imaging study of clinically                            significant abnormality, Evaluation of unexplained                            GI bleeding Providers:                Mauri Pole, MD, Kingsley Plan, RN,                            Despina Pole Tech, Technician, Tressia Miners, CRNA Referring MD:              Medicines:                Monitored Anesthesia Care Complications:            No immediate complications. Estimated Blood Loss:     Estimated blood loss was minimal. Procedure:                Pre-Anesthesia Assessment:                           - Prior to the procedure, a History and Physical                            was performed, and patient medications and                            allergies were reviewed. The patient's tolerance of                            previous anesthesia was also reviewed. The risks                            and benefits of the procedure and the sedation                            options and risks were discussed with the patient.                            All questions were answered, and informed consent                            was obtained. Prior Anticoagulants: The patient                            last took aspirin 1 day prior to the procedure. ASA  Grade Assessment: IV - A patient with severe                            systemic disease that is a constant threat to life.                            After reviewing the risks and benefits, the patient                            was deemed in satisfactory condition to undergo the                            procedure.                           After obtaining informed consent,  the colonoscope                            was passed under direct vision. Throughout the                            procedure, the patient's blood pressure, pulse, and                            oxygen saturations were monitored continuously. The                            EC-[id number] ([id number]) scope was introduced through                            the anus and advanced to the the cecum, identified                            by appendiceal orifice and ileocecal valve. The                            colonoscopy was performed without difficulty. The                            patient tolerated the procedure well. The quality                            of the bowel preparation was adequate. The                            ileocecal valve, appendiceal orifice, and rectum                            were photographed. Scope In: 10:21:09 AM Scope Out: 11:04:50 AM Scope Withdrawal Time: 0 hours 34 minutes 10 seconds  Total Procedure Duration: 0 hours 43 minutes 41 seconds  Findings:      The digital rectal exam was normal.      A few small-mouthed diverticula were found in the sigmoid colon.  A patchy area of mildly erythematous mucosa was found in the sigmoid       colon. Biopsies were taken with a cold forceps for histology.      A scattered area of moderately erythematous mucosa was found in the       rectum. Biopsies were taken with a cold forceps for histology. Stricture       noted in distal rectum at 8cm and removed 3-4 polyp with biopsy forceps      Bleeding internal hemorrhoids were found during retroflexion close to       dentate line. The hemorrhoids were small. Coagulation for hemostasis       using argon plasma was successful. Estimated blood loss was minimal. Impression:               - Diverticulosis in the sigmoid colon.                           - Erythematous mucosa in the sigmoid colon.                            Biopsied.                           - Erythematous mucosa in  the rectum. Biopsied.                           - Bleeding internal hemorrhoids. Treated with argon                            plasma coagulation (APC). Moderate Sedation:      N/A Recommendation:           - Resume previous diet.                           - Continue present medications.                           - Await pathology results. Will need to exclude IBD                           -Anusol suppository BID x 7 days                           - Repeat colonoscopy is recommended. The                            colonoscopy date will be determined after pathology                            results from today's exam become available for                            review.                           - Return to GI clinic PRN. Procedure Code(s):        --- Professional ---  76546, 78, Colonoscopy, flexible; with control of                            bleeding, any method                           45380, Colonoscopy, flexible; with biopsy, single                            or multiple Diagnosis Code(s):        --- Professional ---                           K64.8, Other hemorrhoids                           K63.89, Other specified diseases of intestine                           K62.89, Other specified diseases of anus and rectum                           K92.2, Gastrointestinal hemorrhage, unspecified                           K57.30, Diverticulosis of large intestine without                            perforation or abscess without bleeding                           R93.3, Abnormal findings on diagnostic imaging of                            other parts of digestive tract CPT copyright 2016 American Medical Association. All rights reserved. The codes documented in this report are preliminary and upon coder review may  be revised to meet current compliance requirements. Mauri Pole, MD 04/12/2016 11:24:01 AM This report has been signed  electronically. Number of Addenda: 0

## 2016-04-12 NOTE — Discharge Instructions (Signed)
Your bleeding was seen to be from an internal hemorrhoid. You should continue to treat with a steroid suppository for another 6 days to resolve the inflammation of this bleeding site.  You will need to follow up with Gastroenterology clinic for results of your colonoscopy with several biopsies (small tissue samples) from some inflamed areas in your colon. You may need to have repeat colonoscopy months or a few years from now as follow up from this study.  You should continue to follow up with your regular hemodialysis. Please arrange a follow up appointment with [facility] within the next 2 weeks if able for a check up and medication refills.

## 2016-04-12 NOTE — Progress Notes (Signed)
Subjective: Patient completed bowel prep overnight he initially noted some blood in bowel movements yesterday but this cleared. No ongoing abdominal pain, no fevers or chills today. He was NPO in anticipation of colonoscopy today.  Objective: Vital signs in last 24 hours: Filed Vitals:   04/11/16 1158 04/11/16 2009 04/12/16 0449 04/12/16 0933  BP: 141/78 127/77 139/64 155/79  Pulse:  88 91 90  Temp: 98.6 F (37 C) 98.6 F (37 C) 98.9 F (37.2 C) 98.9 F (37.2 C)  TempSrc: Oral Oral Oral Oral  Resp:  17 18 20   Height:    5' 10"  (1.778 m)  Weight:   72.984 kg (160 lb 14.4 oz) 72.576 kg (160 lb)  SpO2: 100% 100% 100% 100%   Weight change: 1.035 kg (2 lb 4.5 oz)  Intake/Output Summary (Last 24 hours) at 04/12/16 1052 Last data filed at 04/11/16 1848  Gross per 24 hour  Intake 2531.67 ml  Output    700 ml  Net 1831.67 ml   GENERAL- NAD, on HD through RIJ tunneled catheter without acute complaint CARDIAC- RRR, no murmurs, rubs or gallops RESP- CTAB ABDOMEN- soft, nontender to palpation, BS+ throughout EXTREMITIES- No pedal edema  Lab Results: Basic Metabolic Panel:  Recent Labs Lab 04/10/16 1404 04/11/16 0718  NA 133* 137  K 4.5 4.4  CL 96* 96*  CO2 27 29  GLUCOSE 114* 87  BUN 26* 26*  CREATININE 10.48* 12.19*  CALCIUM 8.5* 8.6*  PHOS 3.4 4.2   Liver Function Tests:  Recent Labs Lab 04/08/16 1114  04/10/16 1404 04/11/16 0718  AST 15  --   --   --   ALT 8*  --   --   --   ALKPHOS 73  --   --   --   BILITOT 0.4  --   --   --   PROT 7.5  --   --   --   ALBUMIN 2.6*  < > 2.6* 2.4*  < > = values in this interval not displayed. CBC:  Recent Labs Lab 04/10/16 1404 04/11/16 0411  WBC 4.9 4.6  NEUTROABS  --  2.5  HGB 9.7* 9.4*  HCT 31.1* 30.2*  MCV 74.9* 77.4*  PLT 184 174    Micro Results: Recent Results (from the past 240 hour(s))  Culture, blood (routine x 2)     Status: None (Preliminary result)   Collection Time: 04/08/16 12:36 PM  Result  Value Ref Range Status   Specimen Description BLOOD LEFT HAND  Final   Special Requests IN PEDIATRIC BOTTLE 3CC  Final   Culture NO GROWTH 3 DAYS  Final   Report Status PENDING  Incomplete  Culture, blood (routine x 2)     Status: None (Preliminary result)   Collection Time: 04/08/16 12:40 PM  Result Value Ref Range Status   Specimen Description LEFT ANTECUBITAL  Final   Special Requests BOTTLES DRAWN AEROBIC AND ANAEROBIC 5CC  Final   Culture NO GROWTH 3 DAYS  Final   Report Status PENDING  Incomplete  MRSA PCR Screening     Status: None   Collection Time: 04/09/16  5:17 AM  Result Value Ref Range Status   MRSA by PCR NEGATIVE NEGATIVE Final    Comment:        The GeneXpert MRSA Assay (FDA approved for NASAL specimens only), is one component of a comprehensive MRSA colonization surveillance program. It is not intended to diagnose MRSA infection nor to guide or monitor treatment for  MRSA infections.    Studies/Results: No results found. Medications: I have reviewed the patient's current medications. Scheduled Meds: . [MAR Hold] amLODipine  10 mg Oral Daily  . [MAR Hold] aspirin EC  81 mg Oral QHS  . [MAR Hold] calcitRIOL  0.5 mcg Oral Q T,Th,Sa-HD  . [MAR Hold] calcium-vitamin D  1 tablet Oral QPM  . [MAR Hold] carvedilol  25 mg Oral BID  . [MAR Hold] ferrous sulfate  325 mg Oral TID WC  . [MAR Hold] hydrALAZINE  100 mg Oral BID  . [MAR Hold] lisinopril  40 mg Oral QHS  . [MAR Hold] sevelamer carbonate  2,400 mg Oral TID WC  . [MAR Hold] traZODone  50 mg Oral QHS   Continuous Infusions: . sodium chloride 1,000 mL (04/12/16 0940)   PRN Meds:.simethicone susp in sterile water 1000 mL irrigation, [MAR Hold] traMADol Assessment/Plan: Hematochezia: Bowel prep complete with small blood seen in his first bowel movements but did not continue. Continue to suspect infectious vs inflammatory colitis which will be better characterized with endoscopy today. Hgb not significantly  decreasing, 9.4 from 9.7. - Colonoscopy today - Treatment plan to depend on findings - GI service consulted, recommendations appreciated  ESRD on HD: S/p renal transplant for glomerulonephritis ~15 years ago with acute on chronic failure last year. Currently on TTS HD with a RIJ vasocath for access. No line tenderness/erythema and focal RLQ pain seems disproportionately improving without treatment to be rejection. - Nephrology following, recommendations appreciated  Hypertension: Normotensive on HD and home regimen: Amlodipine 4m, Coreg 234mBID, Hydralazine 10015mID, lisinopril 71m42memia of chronic disease: Stable. Continue ferrous sulfate 325mg52monic R pleural effusion: pleural effusion stable from prior imaging this admission, [facility], plan for eventual pleurodesis in the future given persistence and rapid reaccumulation  FULL CODE Diet: NPO for procedure VTE ppx: SCDs  Dispo: Disposition is deferred at this time, awaiting improvement and workup of current medical problems.  The patient does have a current PCP (OlugbTresa Garter and [facility].  The patient does not have transportation limitations that hinder transportation to clinic appointments.    LOS: 4 days   ChrisCollier Salina6/28/2017, 10:52 AM

## 2016-04-13 ENCOUNTER — Encounter (HOSPITAL_COMMUNITY): Payer: Self-pay | Admitting: Gastroenterology

## 2016-04-13 LAB — CULTURE, BLOOD (ROUTINE X 2)
Culture: NO GROWTH
Culture: NO GROWTH

## 2016-04-13 NOTE — Anesthesia Postprocedure Evaluation (Signed)
Anesthesia Post Note  Patient: Jonathon Conley  Procedure(s) Performed: Procedure(s) (LRB): COLONOSCOPY (N/A) HOT HEMOSTASIS (ARGON PLASMA COAGULATION/BICAP) (N/A)  Patient location during evaluation: PACU Anesthesia Type: MAC Level of consciousness: awake and alert Pain management: pain level controlled Vital Signs Assessment: post-procedure vital signs reviewed and stable Respiratory status: spontaneous breathing, nonlabored ventilation, respiratory function stable and patient connected to nasal cannula oxygen Cardiovascular status: stable and blood pressure returned to baseline Anesthetic complications: no    Last Vitals:  Filed Vitals:   04/12/16 1125 04/12/16 1130  BP: 150/87 145/89  Pulse: 73 74  Temp:    Resp: 21 13    Last Pain:  Filed Vitals:   04/12/16 1132  PainSc: 0-No pain                 G[place] Espina S

## 2016-04-14 DIAGNOSIS — N186 End stage renal disease: Secondary | ICD-10-CM | POA: Diagnosis not present

## 2016-04-14 DIAGNOSIS — Z992 Dependence on renal dialysis: Secondary | ICD-10-CM | POA: Diagnosis not present

## 2016-04-14 DIAGNOSIS — T8610 Unspecified complication of kidney transplant: Secondary | ICD-10-CM | POA: Diagnosis not present

## 2016-04-15 DIAGNOSIS — Z23 Encounter for immunization: Secondary | ICD-10-CM | POA: Diagnosis not present

## 2016-04-15 DIAGNOSIS — D631 Anemia in chronic kidney disease: Secondary | ICD-10-CM | POA: Diagnosis not present

## 2016-04-15 DIAGNOSIS — R945 Abnormal results of liver function studies: Secondary | ICD-10-CM | POA: Diagnosis not present

## 2016-04-15 DIAGNOSIS — N186 End stage renal disease: Secondary | ICD-10-CM | POA: Diagnosis not present

## 2016-04-17 ENCOUNTER — Other Ambulatory Visit: Payer: Self-pay

## 2016-04-17 MED ORDER — MESALAMINE 800 MG PO TBEC: 800.0000 mg | DELAYED_RELEASE_TABLET | Freq: Three times a day (TID) | ORAL | Status: DC

## 2016-04-17 MED ORDER — MESALAMINE 1000 MG RE SUPP: 1000.0000 mg | Freq: Every day | RECTAL | Status: DC

## 2016-04-17 MED ORDER — PREDNISONE 10 MG PO TABS: 20.0000 mg | ORAL_TABLET | Freq: Every day | ORAL | Status: DC

## 2016-04-18 DIAGNOSIS — D631 Anemia in chronic kidney disease: Secondary | ICD-10-CM | POA: Diagnosis not present

## 2016-04-18 DIAGNOSIS — Z23 Encounter for immunization: Secondary | ICD-10-CM | POA: Diagnosis not present

## 2016-04-18 DIAGNOSIS — N186 End stage renal disease: Secondary | ICD-10-CM | POA: Diagnosis not present

## 2016-04-18 DIAGNOSIS — R945 Abnormal results of liver function studies: Secondary | ICD-10-CM | POA: Diagnosis not present

## 2016-04-20 DIAGNOSIS — D631 Anemia in chronic kidney disease: Secondary | ICD-10-CM | POA: Diagnosis not present

## 2016-04-20 DIAGNOSIS — R945 Abnormal results of liver function studies: Secondary | ICD-10-CM | POA: Diagnosis not present

## 2016-04-20 DIAGNOSIS — Z23 Encounter for immunization: Secondary | ICD-10-CM | POA: Diagnosis not present

## 2016-04-20 DIAGNOSIS — N186 End stage renal disease: Secondary | ICD-10-CM | POA: Diagnosis not present

## 2016-04-21 ENCOUNTER — Telehealth: Payer: Self-pay

## 2016-04-21 ENCOUNTER — Other Ambulatory Visit: Payer: Self-pay

## 2016-04-21 MED ORDER — MESALAMINE 1000 MG RE SUPP: 1000.0000 mg | Freq: Every day | RECTAL | Status: DC

## 2016-04-21 MED ORDER — MESALAMINE 800 MG PO TBEC: 800.0000 mg | DELAYED_RELEASE_TABLET | Freq: Three times a day (TID) | ORAL | Status: DC

## 2016-04-21 NOTE — Telephone Encounter (Signed)
Would a follow appointment with an PA be appropriate?

## 2016-04-21 NOTE — Telephone Encounter (Signed)
-----   Message from Mauri Pole, MD sent at 04/17/2016  6:26 AM EDT ----- Biopsies suggestive of chronic active colitis with pseudopolyps likely UC. Please send Rx for Prednisone 28m daily to last until appt and then can consider tapering off . Meslamine suppository 1 gm at bedtime x 4 weeks and start PO mesalamine [id number]. Schedule office visit in 2 weeks, APP if non available on my schedule. Please inform patient the results. Thanks

## 2016-04-22 DIAGNOSIS — N186 End stage renal disease: Secondary | ICD-10-CM | POA: Diagnosis not present

## 2016-04-22 DIAGNOSIS — R945 Abnormal results of liver function studies: Secondary | ICD-10-CM | POA: Diagnosis not present

## 2016-04-22 DIAGNOSIS — Z23 Encounter for immunization: Secondary | ICD-10-CM | POA: Diagnosis not present

## 2016-04-22 DIAGNOSIS — D631 Anemia in chronic kidney disease: Secondary | ICD-10-CM | POA: Diagnosis not present

## 2016-04-23 NOTE — Telephone Encounter (Signed)
He needs to be seen soon, any provider to discuss further management. We cannot discuss it on phone. Thanks

## 2016-04-24 NOTE — Telephone Encounter (Signed)
Confirmed with Mrs Achord. Appt 05/03/16

## 2016-04-24 NOTE — Telephone Encounter (Signed)
No answer-called to spouse. No answer-no voicemail-appointment moved to 05/03/16 at 11:00 with Nicoletta Ba, PA

## 2016-04-25 DIAGNOSIS — D631 Anemia in chronic kidney disease: Secondary | ICD-10-CM | POA: Diagnosis not present

## 2016-04-25 DIAGNOSIS — N186 End stage renal disease: Secondary | ICD-10-CM | POA: Diagnosis not present

## 2016-04-25 DIAGNOSIS — Z23 Encounter for immunization: Secondary | ICD-10-CM | POA: Diagnosis not present

## 2016-04-25 DIAGNOSIS — R945 Abnormal results of liver function studies: Secondary | ICD-10-CM | POA: Diagnosis not present

## 2016-04-26 ENCOUNTER — Telehealth: Payer: Self-pay | Admitting: Gastroenterology

## 2016-04-26 MED ORDER — MESALAMINE 800 MG PO TBEC: 800.0000 mg | DELAYED_RELEASE_TABLET | Freq: Three times a day (TID) | ORAL | Status: DC

## 2016-04-26 MED ORDER — MESALAMINE 1000 MG RE SUPP: 1000.0000 mg | Freq: Every day | RECTAL | Status: DC

## 2016-04-26 MED ORDER — PREDNISONE 10 MG PO TABS: 20.0000 mg | ORAL_TABLET | Freq: Every day | ORAL | Status: DC

## 2016-04-26 NOTE — Telephone Encounter (Signed)
Medications was sent to pharmacy by Dr Silverio Decamp on 04/21/2016  Contacting pharmacy   Tried to contact [facility]  The option to actually speak with someone is not working  Called patient to inform to check pharmacy again and resent medications to [facility] again,  Told him that if pharmacy still does not have medications to have [facility] contact our office

## 2016-04-27 DIAGNOSIS — N186 End stage renal disease: Secondary | ICD-10-CM | POA: Diagnosis not present

## 2016-04-27 DIAGNOSIS — R945 Abnormal results of liver function studies: Secondary | ICD-10-CM | POA: Diagnosis not present

## 2016-04-27 DIAGNOSIS — Z23 Encounter for immunization: Secondary | ICD-10-CM | POA: Diagnosis not present

## 2016-04-27 DIAGNOSIS — D631 Anemia in chronic kidney disease: Secondary | ICD-10-CM | POA: Diagnosis not present

## 2016-04-29 DIAGNOSIS — N186 End stage renal disease: Secondary | ICD-10-CM | POA: Diagnosis not present

## 2016-04-29 DIAGNOSIS — R945 Abnormal results of liver function studies: Secondary | ICD-10-CM | POA: Diagnosis not present

## 2016-04-29 DIAGNOSIS — Z23 Encounter for immunization: Secondary | ICD-10-CM | POA: Diagnosis not present

## 2016-04-29 DIAGNOSIS — D631 Anemia in chronic kidney disease: Secondary | ICD-10-CM | POA: Diagnosis not present

## 2016-05-02 DIAGNOSIS — N186 End stage renal disease: Secondary | ICD-10-CM | POA: Diagnosis not present

## 2016-05-02 DIAGNOSIS — D631 Anemia in chronic kidney disease: Secondary | ICD-10-CM | POA: Diagnosis not present

## 2016-05-02 DIAGNOSIS — Z23 Encounter for immunization: Secondary | ICD-10-CM | POA: Diagnosis not present

## 2016-05-02 DIAGNOSIS — R945 Abnormal results of liver function studies: Secondary | ICD-10-CM | POA: Diagnosis not present

## 2016-05-03 ENCOUNTER — Encounter: Payer: Self-pay | Admitting: Physician Assistant

## 2016-05-03 ENCOUNTER — Ambulatory Visit (INDEPENDENT_AMBULATORY_CARE_PROVIDER_SITE_OTHER): Payer: Medicare Other | Admitting: Physician Assistant

## 2016-05-03 VITALS — BP 186/102 | HR 90 | Ht 70.0 in | Wt 165.0 lb

## 2016-05-03 DIAGNOSIS — K51011 Ulcerative (chronic) pancolitis with rectal bleeding: Secondary | ICD-10-CM

## 2016-05-03 MED ORDER — MESALAMINE 800 MG PO TBEC: 800.0000 mg | DELAYED_RELEASE_TABLET | Freq: Three times a day (TID) | ORAL | Status: DC

## 2016-05-03 NOTE — Patient Instructions (Signed)
Decrease Prednisone to 10 mg every morning  for 14 days then stop.  You may stop the suppositories. Continue th Asacol 800 mg, take 1 tab 3 times daily. We sent refills to [facility] [address].   We made you a follow up appointment with Dr.  Silverio Decamp 07-11-2016 at 10:30 am.

## 2016-05-03 NOTE — Progress Notes (Signed)
Reviewed and agree with documentation and assessment and plan. K. Veena Nandigam , MD   

## 2016-05-03 NOTE — Progress Notes (Signed)
Patient ID: Jonathon Conley, male   DOB: [birth year], 50 y.o.   MRN: [id number]   Subjective:    Patient ID: Jonathon Conley, male    DOB: [birth year], 50 y.o.   MRN: [id number]  HPI [place] is a pleasant 50 year old African-American male recently known to Dr. Silverio Decamp, when seen in consultation during recent hospitalization. He comes in today for post hospital follow-up. Patient has history of chronic renal failure and a failed transplant. He is on dialysis, also has chronic anemia, severe hypertension, obstructive sleep apnea. E was admitted to the hospital on 04/08/2016 through 04/12/2016 with complaints of rectal bleeding, at that time also had complaints of intermittent fevers chills and aching all over his body. Not having any significant abdominal pain. He had CT of the abdomen and pelvis done which showed thickening of the cecum proximal and ascending colon and sigmoid colon concerning for colitis. He underwent colonoscopy on 04/12/2016 with finding of mild patchy erythema in the sigmoid colon and rectum also scattered diverticulosis and bleeding internal hemorrhoids which were treated with APC. Report states he also had a mild stricture in his distal rectum. Biopsies were taken and showed a chronic active colitis with pseudopolyps. Is felt he probably has ulcerative colitis. Biopsy returned after he was discharged from the hospital and he was started on prednisone 20 mg by mouth every morning Canasa suppositories at bedtime and a's a call 800 mg 3 times a day. He states she's been doing well since discharge from the hospital, he has no complaints of abdominal discomfort never had any problems with diarrhea or cramping. He has not had any further bleeding since discharge from the hospital. He has been taking his medicines as prescribed with the exception of the suppositories which she says he's been "skipping".  Review of Systems Pertinent positive and negative review of systems were noted in the above HPI  section.  All other review of systems was otherwise negative.  Outpatient Encounter Prescriptions as of 05/03/2016  Medication Sig  . amLODipine (NORVASC) 10 MG tablet Take 10 mg by mouth daily.  Marland Kitchen aspirin EC 81 MG tablet Take 81 mg by mouth at bedtime.  . calcitRIOL (ROCALTROL) 0.5 MCG capsule Take 1 capsule (0.5 mcg total) by mouth Every Tuesday,Thursday,and Saturday with dialysis.  Marland Kitchen calcium acetate (PHOSLO) 667 MG capsule Take 667 mg by mouth 3 (three) times daily with meals.  . carvedilol (COREG) 25 MG tablet Take 25 mg by mouth 2 (two) times daily.   Marland Kitchen doxazosin (CARDURA) 8 MG tablet Take 8 mg by mouth daily.  . ferrous sulfate 325 (65 FE) MG tablet Take 325 mg by mouth 3 (three) times daily with meals. Reported on 04/09/2016  . hydrALAZINE (APRESOLINE) 50 MG tablet Take 25 mg by mouth 3 (three) times daily. Reported on 04/09/2016  . hydrocortisone (ANUSOL-HC) 25 MG suppository Place 1 suppository (25 mg total) rectally 2 (two) times daily.  . mesalamine (CANASA) 1000 MG suppository Place 1 suppository (1,000 mg total) rectally at bedtime. Use for 4 weeks.  . Mesalamine 800 MG TBEC Take 1 tablet (800 mg total) by mouth 3 (three) times daily.  . predniSONE (DELTASONE) 10 MG tablet Take 2 tablets (20 mg total) by mouth daily with breakfast.  . sevelamer carbonate (RENVELA) 800 MG tablet Take 2,400 mg by mouth 3 (three) times daily with meals. Reported on 04/09/2016  . simvastatin (ZOCOR) 5 MG tablet Take 1 tablet (5 mg total) by mouth at bedtime.  . [DISCONTINUED] Mesalamine 800 MG TBEC  Take 1 tablet (800 mg total) by mouth 3 (three) times daily.  . [DISCONTINUED] Calcium Carbonate-Vitamin D (CALCIUM 600+D) 600-400 MG-UNIT tablet Take 1 tablet by mouth every evening. Reported on 04/09/2016  . [DISCONTINUED] lisinopril (PRINIVIL,ZESTRIL) 40 MG tablet Take 1 tablet (40 mg total) by mouth at bedtime. (Patient not taking: Reported on 04/09/2016)   No facility-administered encounter medications on  file as of 05/03/2016.   No Known Allergies Patient Active Problem List   Diagnosis Date Noted  . Hematochezia 04/08/2016  . HTN (hypertension) 04/08/2016  . Intravenous line infection ([place]) 04/08/2016  . Insomnia 01/04/2016  . Malignant hypertension 12/14/2015  . Symptomatic anemia 11/03/2015  . Chronic anemia 11/03/2015  . Thrombocytopenia ([place]) 11/03/2015  . Pleural effusion on right 11/03/2015  . Shortness of breath 11/02/2015  . Sepsis ([place]) 09/23/2015  . ESRD on dialysis ([place]) 09/22/2015  . Accelerated hypertension 09/22/2015  . Leg edema, left   . HCAP (healthcare-associated pneumonia) 09/21/2015  . Bacteremia due to Gram-positive bacteria 08/20/2015  . Acute blood loss anemia 08/12/2015  . C. difficile colitis 08/12/2015  . Bleeding gastrointestinal 08/12/2015  . Acute-on-chronic renal failure ([place]) 08/10/2015  . H/O kidney transplant 08/08/2015  . Non compliance with medical treatment 08/08/2015  . History of anemia 08/08/2015  . AKI (acute kidney injury) ([place]) 02/14/2015  . Renal transplant recipient 02/14/2015  . Acute on chronic diastolic congestive heart failure ([place]) 02/14/2015  . Anemia 02/14/2015  . Hyperlipidemia   . OSA (obstructive sleep apnea) 03/27/2013   Social History   Social History  . Marital Status: Single    Spouse Name: N/A  . Number of Children: N/A  . Years of Education: N/A   Occupational History  . Not on file.   Social History Main Topics  . Smoking status: Never Smoker   . Smokeless tobacco: Never Used  . Alcohol Use: No  . Drug Use: No  . Sexual Activity: Not on file   Other Topics Concern  . Not on file   Social History Narrative    Mr. Blasius family history includes Asthma in his son and son; Cancer in his maternal grandfather, maternal grandmother, and mother; Heart disease in his mother.      Objective:    Filed Vitals:   05/03/16 1056  BP: 186/102  Pulse: 90    Physical Exam  well-developed  African-American male in no acute distress, accompanied by his wife blood pressure 180/100 pulse 90 Height 5 foot 10 weight 165. HEENT; nontraumatic, cephalic EOMI PERRLA sclera anicteric, Cardiovascular ;regular rate and rhythm with W4-R1 soft systolic murmur, Pulmonary; clear bilaterally, Abdomen; soft nontender nondistended bowel sounds are active there is no palpable mass or hepatosplenomegaly, Rectal ;exam not done, Ext; no clubbing cyanosis or edema skin warm and dry he has dialysis graft in both arms, Neuropsych ;mood and affect appropriate     Assessment & Plan:   #44 50 year old African-American male with end-stage renal disease on dialysis with new diagnosis of pan ulcerative colitis which at this time is mild. He is currently asymptomatic and has not had any rectal bleeding since discharge from the hospital. #2 severe hypertension #3 chronic anemia #4 obstructive sleep apnea  Plan; We'll decrease prednisone to 10 mg by mouth every morning 2 weeks then stop Stop Canasa suppositories for now as had not had any bleeding in the past few weeks he is advised to resume should he have recurrence of bleeding Continue Asacol 800 mg by mouth 3 times a day- Had  a discussion with patient and his wife regarding the chronicity of this disease and need to be compliant with maintenance therapy and regular GI follow-up. He voices understanding. He'll follow-up with Dr. Silverio Decamp  in 3 months or sooner when necessary and is asked to call if he has any active symptoms in the interim   Alfredia Ferguson PA-C 05/03/2016   Cc: Tresa Garter, MD

## 2016-05-04 ENCOUNTER — Ambulatory Visit: Payer: Medicare Other | Admitting: Gastroenterology

## 2016-05-04 DIAGNOSIS — R945 Abnormal results of liver function studies: Secondary | ICD-10-CM | POA: Diagnosis not present

## 2016-05-04 DIAGNOSIS — Z23 Encounter for immunization: Secondary | ICD-10-CM | POA: Diagnosis not present

## 2016-05-04 DIAGNOSIS — N186 End stage renal disease: Secondary | ICD-10-CM | POA: Diagnosis not present

## 2016-05-04 DIAGNOSIS — D631 Anemia in chronic kidney disease: Secondary | ICD-10-CM | POA: Diagnosis not present

## 2016-05-06 DIAGNOSIS — R945 Abnormal results of liver function studies: Secondary | ICD-10-CM | POA: Diagnosis not present

## 2016-05-06 DIAGNOSIS — D631 Anemia in chronic kidney disease: Secondary | ICD-10-CM | POA: Diagnosis not present

## 2016-05-06 DIAGNOSIS — N186 End stage renal disease: Secondary | ICD-10-CM | POA: Diagnosis not present

## 2016-05-06 DIAGNOSIS — Z23 Encounter for immunization: Secondary | ICD-10-CM | POA: Diagnosis not present

## 2016-05-09 ENCOUNTER — Ambulatory Visit: Payer: Medicare Other | Admitting: Internal Medicine

## 2016-05-09 DIAGNOSIS — N186 End stage renal disease: Secondary | ICD-10-CM | POA: Diagnosis not present

## 2016-05-09 DIAGNOSIS — R945 Abnormal results of liver function studies: Secondary | ICD-10-CM | POA: Diagnosis not present

## 2016-05-09 DIAGNOSIS — Z23 Encounter for immunization: Secondary | ICD-10-CM | POA: Diagnosis not present

## 2016-05-09 DIAGNOSIS — D631 Anemia in chronic kidney disease: Secondary | ICD-10-CM | POA: Diagnosis not present

## 2016-05-11 DIAGNOSIS — D631 Anemia in chronic kidney disease: Secondary | ICD-10-CM | POA: Diagnosis not present

## 2016-05-11 DIAGNOSIS — Z23 Encounter for immunization: Secondary | ICD-10-CM | POA: Diagnosis not present

## 2016-05-11 DIAGNOSIS — R945 Abnormal results of liver function studies: Secondary | ICD-10-CM | POA: Diagnosis not present

## 2016-05-11 DIAGNOSIS — N186 End stage renal disease: Secondary | ICD-10-CM | POA: Diagnosis not present

## 2016-05-13 DIAGNOSIS — Z23 Encounter for immunization: Secondary | ICD-10-CM | POA: Diagnosis not present

## 2016-05-13 DIAGNOSIS — R945 Abnormal results of liver function studies: Secondary | ICD-10-CM | POA: Diagnosis not present

## 2016-05-13 DIAGNOSIS — D631 Anemia in chronic kidney disease: Secondary | ICD-10-CM | POA: Diagnosis not present

## 2016-05-13 DIAGNOSIS — N186 End stage renal disease: Secondary | ICD-10-CM | POA: Diagnosis not present

## 2016-05-15 DIAGNOSIS — T8610 Unspecified complication of kidney transplant: Secondary | ICD-10-CM | POA: Diagnosis not present

## 2016-05-15 DIAGNOSIS — Z992 Dependence on renal dialysis: Secondary | ICD-10-CM | POA: Diagnosis not present

## 2016-05-15 DIAGNOSIS — N186 End stage renal disease: Secondary | ICD-10-CM | POA: Diagnosis not present

## 2016-05-16 DIAGNOSIS — N186 End stage renal disease: Secondary | ICD-10-CM | POA: Diagnosis not present

## 2016-05-16 DIAGNOSIS — D509 Iron deficiency anemia, unspecified: Secondary | ICD-10-CM | POA: Diagnosis not present

## 2016-05-18 DIAGNOSIS — N186 End stage renal disease: Secondary | ICD-10-CM | POA: Diagnosis not present

## 2016-05-18 DIAGNOSIS — D509 Iron deficiency anemia, unspecified: Secondary | ICD-10-CM | POA: Diagnosis not present

## 2016-05-20 DIAGNOSIS — N186 End stage renal disease: Secondary | ICD-10-CM | POA: Diagnosis not present

## 2016-05-20 DIAGNOSIS — D509 Iron deficiency anemia, unspecified: Secondary | ICD-10-CM | POA: Diagnosis not present

## 2016-05-23 DIAGNOSIS — D509 Iron deficiency anemia, unspecified: Secondary | ICD-10-CM | POA: Diagnosis not present

## 2016-05-23 DIAGNOSIS — N186 End stage renal disease: Secondary | ICD-10-CM | POA: Diagnosis not present

## 2016-05-25 DIAGNOSIS — N186 End stage renal disease: Secondary | ICD-10-CM | POA: Diagnosis not present

## 2016-05-25 DIAGNOSIS — D509 Iron deficiency anemia, unspecified: Secondary | ICD-10-CM | POA: Diagnosis not present

## 2016-05-26 DIAGNOSIS — D509 Iron deficiency anemia, unspecified: Secondary | ICD-10-CM | POA: Diagnosis not present

## 2016-05-26 DIAGNOSIS — N186 End stage renal disease: Secondary | ICD-10-CM | POA: Diagnosis not present

## 2016-05-30 DIAGNOSIS — D509 Iron deficiency anemia, unspecified: Secondary | ICD-10-CM | POA: Diagnosis not present

## 2016-05-30 DIAGNOSIS — N186 End stage renal disease: Secondary | ICD-10-CM | POA: Diagnosis not present

## 2016-06-01 DIAGNOSIS — N186 End stage renal disease: Secondary | ICD-10-CM | POA: Diagnosis not present

## 2016-06-01 DIAGNOSIS — D509 Iron deficiency anemia, unspecified: Secondary | ICD-10-CM | POA: Diagnosis not present

## 2016-06-03 DIAGNOSIS — D509 Iron deficiency anemia, unspecified: Secondary | ICD-10-CM | POA: Diagnosis not present

## 2016-06-03 DIAGNOSIS — N186 End stage renal disease: Secondary | ICD-10-CM | POA: Diagnosis not present

## 2016-06-06 DIAGNOSIS — N186 End stage renal disease: Secondary | ICD-10-CM | POA: Diagnosis not present

## 2016-06-06 DIAGNOSIS — D509 Iron deficiency anemia, unspecified: Secondary | ICD-10-CM | POA: Diagnosis not present

## 2016-06-07 ENCOUNTER — Telehealth: Payer: Self-pay

## 2016-06-08 DIAGNOSIS — D509 Iron deficiency anemia, unspecified: Secondary | ICD-10-CM | POA: Diagnosis not present

## 2016-06-08 DIAGNOSIS — N186 End stage renal disease: Secondary | ICD-10-CM | POA: Diagnosis not present

## 2016-06-08 NOTE — Telephone Encounter (Signed)
Called left message for patient to call back. Thanks!

## 2016-06-10 DIAGNOSIS — D509 Iron deficiency anemia, unspecified: Secondary | ICD-10-CM | POA: Diagnosis not present

## 2016-06-10 DIAGNOSIS — N186 End stage renal disease: Secondary | ICD-10-CM | POA: Diagnosis not present

## 2016-06-13 DIAGNOSIS — N186 End stage renal disease: Secondary | ICD-10-CM | POA: Diagnosis not present

## 2016-06-13 DIAGNOSIS — D509 Iron deficiency anemia, unspecified: Secondary | ICD-10-CM | POA: Diagnosis not present

## 2016-06-14 DIAGNOSIS — N186 End stage renal disease: Secondary | ICD-10-CM | POA: Diagnosis not present

## 2016-06-14 DIAGNOSIS — D509 Iron deficiency anemia, unspecified: Secondary | ICD-10-CM | POA: Diagnosis not present

## 2016-06-15 DIAGNOSIS — T8610 Unspecified complication of kidney transplant: Secondary | ICD-10-CM | POA: Diagnosis not present

## 2016-06-15 DIAGNOSIS — N186 End stage renal disease: Secondary | ICD-10-CM | POA: Diagnosis not present

## 2016-06-15 DIAGNOSIS — Z992 Dependence on renal dialysis: Secondary | ICD-10-CM | POA: Diagnosis not present

## 2016-06-17 DIAGNOSIS — N186 End stage renal disease: Secondary | ICD-10-CM | POA: Diagnosis not present

## 2016-06-17 DIAGNOSIS — Z992 Dependence on renal dialysis: Secondary | ICD-10-CM | POA: Diagnosis not present

## 2016-06-17 DIAGNOSIS — D631 Anemia in chronic kidney disease: Secondary | ICD-10-CM | POA: Diagnosis not present

## 2016-06-20 DIAGNOSIS — Z992 Dependence on renal dialysis: Secondary | ICD-10-CM | POA: Diagnosis not present

## 2016-06-20 DIAGNOSIS — D631 Anemia in chronic kidney disease: Secondary | ICD-10-CM | POA: Diagnosis not present

## 2016-06-20 DIAGNOSIS — N186 End stage renal disease: Secondary | ICD-10-CM | POA: Diagnosis not present

## 2016-06-22 DIAGNOSIS — Z992 Dependence on renal dialysis: Secondary | ICD-10-CM | POA: Diagnosis not present

## 2016-06-22 DIAGNOSIS — N186 End stage renal disease: Secondary | ICD-10-CM | POA: Diagnosis not present

## 2016-06-22 DIAGNOSIS — D631 Anemia in chronic kidney disease: Secondary | ICD-10-CM | POA: Diagnosis not present

## 2016-06-24 DIAGNOSIS — Z992 Dependence on renal dialysis: Secondary | ICD-10-CM | POA: Diagnosis not present

## 2016-06-24 DIAGNOSIS — N186 End stage renal disease: Secondary | ICD-10-CM | POA: Diagnosis not present

## 2016-06-24 DIAGNOSIS — D631 Anemia in chronic kidney disease: Secondary | ICD-10-CM | POA: Diagnosis not present

## 2016-06-26 DIAGNOSIS — D631 Anemia in chronic kidney disease: Secondary | ICD-10-CM | POA: Diagnosis not present

## 2016-06-26 DIAGNOSIS — N186 End stage renal disease: Secondary | ICD-10-CM | POA: Diagnosis not present

## 2016-06-26 DIAGNOSIS — Z992 Dependence on renal dialysis: Secondary | ICD-10-CM | POA: Diagnosis not present

## 2016-06-29 DIAGNOSIS — Z992 Dependence on renal dialysis: Secondary | ICD-10-CM | POA: Diagnosis not present

## 2016-06-29 DIAGNOSIS — D631 Anemia in chronic kidney disease: Secondary | ICD-10-CM | POA: Diagnosis not present

## 2016-06-29 DIAGNOSIS — N186 End stage renal disease: Secondary | ICD-10-CM | POA: Diagnosis not present

## 2016-07-04 DIAGNOSIS — D631 Anemia in chronic kidney disease: Secondary | ICD-10-CM | POA: Diagnosis not present

## 2016-07-04 DIAGNOSIS — N186 End stage renal disease: Secondary | ICD-10-CM | POA: Diagnosis not present

## 2016-07-04 DIAGNOSIS — Z992 Dependence on renal dialysis: Secondary | ICD-10-CM | POA: Diagnosis not present

## 2016-07-06 DIAGNOSIS — D631 Anemia in chronic kidney disease: Secondary | ICD-10-CM | POA: Diagnosis not present

## 2016-07-06 DIAGNOSIS — N186 End stage renal disease: Secondary | ICD-10-CM | POA: Diagnosis not present

## 2016-07-06 DIAGNOSIS — Z992 Dependence on renal dialysis: Secondary | ICD-10-CM | POA: Diagnosis not present

## 2016-07-08 DIAGNOSIS — N186 End stage renal disease: Secondary | ICD-10-CM | POA: Diagnosis not present

## 2016-07-08 DIAGNOSIS — D631 Anemia in chronic kidney disease: Secondary | ICD-10-CM | POA: Diagnosis not present

## 2016-07-08 DIAGNOSIS — Z992 Dependence on renal dialysis: Secondary | ICD-10-CM | POA: Diagnosis not present

## 2016-07-11 ENCOUNTER — Ambulatory Visit: Payer: Medicare Other | Admitting: Gastroenterology

## 2016-07-11 DIAGNOSIS — D631 Anemia in chronic kidney disease: Secondary | ICD-10-CM | POA: Diagnosis not present

## 2016-07-11 DIAGNOSIS — Z992 Dependence on renal dialysis: Secondary | ICD-10-CM | POA: Diagnosis not present

## 2016-07-11 DIAGNOSIS — N186 End stage renal disease: Secondary | ICD-10-CM | POA: Diagnosis not present

## 2016-07-13 DIAGNOSIS — D631 Anemia in chronic kidney disease: Secondary | ICD-10-CM | POA: Diagnosis not present

## 2016-07-13 DIAGNOSIS — N186 End stage renal disease: Secondary | ICD-10-CM | POA: Diagnosis not present

## 2016-07-13 DIAGNOSIS — Z992 Dependence on renal dialysis: Secondary | ICD-10-CM | POA: Diagnosis not present

## 2016-07-15 DIAGNOSIS — Z992 Dependence on renal dialysis: Secondary | ICD-10-CM | POA: Diagnosis not present

## 2016-07-15 DIAGNOSIS — D631 Anemia in chronic kidney disease: Secondary | ICD-10-CM | POA: Diagnosis not present

## 2016-07-15 DIAGNOSIS — N186 End stage renal disease: Secondary | ICD-10-CM | POA: Diagnosis not present

## 2016-07-15 DIAGNOSIS — T8610 Unspecified complication of kidney transplant: Secondary | ICD-10-CM | POA: Diagnosis not present

## 2016-07-18 DIAGNOSIS — D631 Anemia in chronic kidney disease: Secondary | ICD-10-CM | POA: Diagnosis not present

## 2016-07-18 DIAGNOSIS — N186 End stage renal disease: Secondary | ICD-10-CM | POA: Diagnosis not present

## 2016-07-18 DIAGNOSIS — Z23 Encounter for immunization: Secondary | ICD-10-CM | POA: Diagnosis not present

## 2016-07-20 DIAGNOSIS — D631 Anemia in chronic kidney disease: Secondary | ICD-10-CM | POA: Diagnosis not present

## 2016-07-20 DIAGNOSIS — Z23 Encounter for immunization: Secondary | ICD-10-CM | POA: Diagnosis not present

## 2016-07-20 DIAGNOSIS — N186 End stage renal disease: Secondary | ICD-10-CM | POA: Diagnosis not present

## 2016-07-21 DIAGNOSIS — Z23 Encounter for immunization: Secondary | ICD-10-CM | POA: Diagnosis not present

## 2016-07-21 DIAGNOSIS — N186 End stage renal disease: Secondary | ICD-10-CM | POA: Diagnosis not present

## 2016-07-21 DIAGNOSIS — D631 Anemia in chronic kidney disease: Secondary | ICD-10-CM | POA: Diagnosis not present

## 2016-07-25 DIAGNOSIS — D631 Anemia in chronic kidney disease: Secondary | ICD-10-CM | POA: Diagnosis not present

## 2016-07-25 DIAGNOSIS — Z23 Encounter for immunization: Secondary | ICD-10-CM | POA: Diagnosis not present

## 2016-07-25 DIAGNOSIS — N186 End stage renal disease: Secondary | ICD-10-CM | POA: Diagnosis not present

## 2016-07-27 DIAGNOSIS — Z23 Encounter for immunization: Secondary | ICD-10-CM | POA: Diagnosis not present

## 2016-07-27 DIAGNOSIS — N186 End stage renal disease: Secondary | ICD-10-CM | POA: Diagnosis not present

## 2016-07-27 DIAGNOSIS — D631 Anemia in chronic kidney disease: Secondary | ICD-10-CM | POA: Diagnosis not present

## 2016-07-29 DIAGNOSIS — N186 End stage renal disease: Secondary | ICD-10-CM | POA: Diagnosis not present

## 2016-07-29 DIAGNOSIS — D631 Anemia in chronic kidney disease: Secondary | ICD-10-CM | POA: Diagnosis not present

## 2016-07-29 DIAGNOSIS — Z23 Encounter for immunization: Secondary | ICD-10-CM | POA: Diagnosis not present

## 2016-08-01 DIAGNOSIS — N186 End stage renal disease: Secondary | ICD-10-CM | POA: Diagnosis not present

## 2016-08-01 DIAGNOSIS — D631 Anemia in chronic kidney disease: Secondary | ICD-10-CM | POA: Diagnosis not present

## 2016-08-01 DIAGNOSIS — Z23 Encounter for immunization: Secondary | ICD-10-CM | POA: Diagnosis not present

## 2016-08-03 DIAGNOSIS — Z23 Encounter for immunization: Secondary | ICD-10-CM | POA: Diagnosis not present

## 2016-08-03 DIAGNOSIS — N186 End stage renal disease: Secondary | ICD-10-CM | POA: Diagnosis not present

## 2016-08-03 DIAGNOSIS — D631 Anemia in chronic kidney disease: Secondary | ICD-10-CM | POA: Diagnosis not present

## 2016-08-05 DIAGNOSIS — N186 End stage renal disease: Secondary | ICD-10-CM | POA: Diagnosis not present

## 2016-08-05 DIAGNOSIS — D631 Anemia in chronic kidney disease: Secondary | ICD-10-CM | POA: Diagnosis not present

## 2016-08-05 DIAGNOSIS — N2581 Secondary hyperparathyroidism of renal origin: Secondary | ICD-10-CM | POA: Diagnosis not present

## 2016-08-05 DIAGNOSIS — T8619 Other complication of kidney transplant: Secondary | ICD-10-CM | POA: Diagnosis not present

## 2016-08-05 DIAGNOSIS — Z992 Dependence on renal dialysis: Secondary | ICD-10-CM | POA: Diagnosis not present

## 2016-08-08 DIAGNOSIS — D631 Anemia in chronic kidney disease: Secondary | ICD-10-CM | POA: Diagnosis not present

## 2016-08-08 DIAGNOSIS — N186 End stage renal disease: Secondary | ICD-10-CM | POA: Diagnosis not present

## 2016-08-08 DIAGNOSIS — N2581 Secondary hyperparathyroidism of renal origin: Secondary | ICD-10-CM | POA: Diagnosis not present

## 2016-08-10 DIAGNOSIS — N186 End stage renal disease: Secondary | ICD-10-CM | POA: Diagnosis not present

## 2016-08-10 DIAGNOSIS — D631 Anemia in chronic kidney disease: Secondary | ICD-10-CM | POA: Diagnosis not present

## 2016-08-10 DIAGNOSIS — Z23 Encounter for immunization: Secondary | ICD-10-CM | POA: Diagnosis not present

## 2016-08-15 DIAGNOSIS — N186 End stage renal disease: Secondary | ICD-10-CM | POA: Diagnosis not present

## 2016-08-15 DIAGNOSIS — T8610 Unspecified complication of kidney transplant: Secondary | ICD-10-CM | POA: Diagnosis not present

## 2016-08-15 DIAGNOSIS — Z992 Dependence on renal dialysis: Secondary | ICD-10-CM | POA: Diagnosis not present

## 2016-08-15 DIAGNOSIS — D631 Anemia in chronic kidney disease: Secondary | ICD-10-CM | POA: Diagnosis not present

## 2016-08-15 DIAGNOSIS — Z23 Encounter for immunization: Secondary | ICD-10-CM | POA: Diagnosis not present

## 2016-08-17 DIAGNOSIS — D631 Anemia in chronic kidney disease: Secondary | ICD-10-CM | POA: Diagnosis not present

## 2016-08-17 DIAGNOSIS — N186 End stage renal disease: Secondary | ICD-10-CM | POA: Diagnosis not present

## 2016-08-19 DIAGNOSIS — N186 End stage renal disease: Secondary | ICD-10-CM | POA: Diagnosis not present

## 2016-08-19 DIAGNOSIS — D631 Anemia in chronic kidney disease: Secondary | ICD-10-CM | POA: Diagnosis not present

## 2016-08-22 DIAGNOSIS — N186 End stage renal disease: Secondary | ICD-10-CM | POA: Diagnosis not present

## 2016-08-22 DIAGNOSIS — D631 Anemia in chronic kidney disease: Secondary | ICD-10-CM | POA: Diagnosis not present

## 2016-08-24 DIAGNOSIS — N186 End stage renal disease: Secondary | ICD-10-CM | POA: Diagnosis not present

## 2016-08-24 DIAGNOSIS — D631 Anemia in chronic kidney disease: Secondary | ICD-10-CM | POA: Diagnosis not present

## 2016-08-25 DIAGNOSIS — D631 Anemia in chronic kidney disease: Secondary | ICD-10-CM | POA: Diagnosis not present

## 2016-08-25 DIAGNOSIS — N186 End stage renal disease: Secondary | ICD-10-CM | POA: Diagnosis not present

## 2016-08-29 DIAGNOSIS — D631 Anemia in chronic kidney disease: Secondary | ICD-10-CM | POA: Diagnosis not present

## 2016-08-29 DIAGNOSIS — N186 End stage renal disease: Secondary | ICD-10-CM | POA: Diagnosis not present

## 2016-08-31 DIAGNOSIS — D631 Anemia in chronic kidney disease: Secondary | ICD-10-CM | POA: Diagnosis not present

## 2016-08-31 DIAGNOSIS — N186 End stage renal disease: Secondary | ICD-10-CM | POA: Diagnosis not present

## 2016-09-02 DIAGNOSIS — D631 Anemia in chronic kidney disease: Secondary | ICD-10-CM | POA: Diagnosis not present

## 2016-09-02 DIAGNOSIS — N186 End stage renal disease: Secondary | ICD-10-CM | POA: Diagnosis not present

## 2016-09-04 DIAGNOSIS — N186 End stage renal disease: Secondary | ICD-10-CM | POA: Diagnosis not present

## 2016-09-04 DIAGNOSIS — D631 Anemia in chronic kidney disease: Secondary | ICD-10-CM | POA: Diagnosis not present

## 2016-09-06 DIAGNOSIS — D631 Anemia in chronic kidney disease: Secondary | ICD-10-CM | POA: Diagnosis not present

## 2016-09-06 DIAGNOSIS — N186 End stage renal disease: Secondary | ICD-10-CM | POA: Diagnosis not present

## 2016-09-09 DIAGNOSIS — N186 End stage renal disease: Secondary | ICD-10-CM | POA: Diagnosis not present

## 2016-09-09 DIAGNOSIS — D631 Anemia in chronic kidney disease: Secondary | ICD-10-CM | POA: Diagnosis not present

## 2016-09-12 DIAGNOSIS — N186 End stage renal disease: Secondary | ICD-10-CM | POA: Diagnosis not present

## 2016-09-12 DIAGNOSIS — D631 Anemia in chronic kidney disease: Secondary | ICD-10-CM | POA: Diagnosis not present

## 2016-09-14 DIAGNOSIS — D631 Anemia in chronic kidney disease: Secondary | ICD-10-CM | POA: Diagnosis not present

## 2016-09-14 DIAGNOSIS — Z992 Dependence on renal dialysis: Secondary | ICD-10-CM | POA: Diagnosis not present

## 2016-09-14 DIAGNOSIS — T8610 Unspecified complication of kidney transplant: Secondary | ICD-10-CM | POA: Diagnosis not present

## 2016-09-14 DIAGNOSIS — N186 End stage renal disease: Secondary | ICD-10-CM | POA: Diagnosis not present

## 2016-09-16 DIAGNOSIS — D631 Anemia in chronic kidney disease: Secondary | ICD-10-CM | POA: Diagnosis not present

## 2016-09-16 DIAGNOSIS — N186 End stage renal disease: Secondary | ICD-10-CM | POA: Diagnosis not present

## 2016-09-19 DIAGNOSIS — D631 Anemia in chronic kidney disease: Secondary | ICD-10-CM | POA: Diagnosis not present

## 2016-09-19 DIAGNOSIS — N186 End stage renal disease: Secondary | ICD-10-CM | POA: Diagnosis not present

## 2016-09-21 DIAGNOSIS — N186 End stage renal disease: Secondary | ICD-10-CM | POA: Diagnosis not present

## 2016-09-21 DIAGNOSIS — D631 Anemia in chronic kidney disease: Secondary | ICD-10-CM | POA: Diagnosis not present

## 2016-09-22 ENCOUNTER — Ambulatory Visit (INDEPENDENT_AMBULATORY_CARE_PROVIDER_SITE_OTHER): Payer: Medicare Other | Admitting: Vascular Surgery

## 2016-09-22 ENCOUNTER — Encounter (INDEPENDENT_AMBULATORY_CARE_PROVIDER_SITE_OTHER): Payer: Self-pay | Admitting: Vascular Surgery

## 2016-09-22 ENCOUNTER — Encounter (INDEPENDENT_AMBULATORY_CARE_PROVIDER_SITE_OTHER): Payer: Self-pay

## 2016-09-22 VITALS — BP 133/83 | HR 88 | Resp 17 | Ht 70.0 in | Wt 179.4 lb

## 2016-09-22 DIAGNOSIS — N186 End stage renal disease: Secondary | ICD-10-CM

## 2016-09-22 DIAGNOSIS — E785 Hyperlipidemia, unspecified: Secondary | ICD-10-CM | POA: Diagnosis not present

## 2016-09-22 DIAGNOSIS — Z992 Dependence on renal dialysis: Secondary | ICD-10-CM | POA: Diagnosis not present

## 2016-09-22 DIAGNOSIS — I10 Essential (primary) hypertension: Secondary | ICD-10-CM

## 2016-09-22 DIAGNOSIS — D649 Anemia, unspecified: Secondary | ICD-10-CM

## 2016-09-22 NOTE — Assessment & Plan Note (Signed)
The patient has long-standing end-stage renal disease of about 20 years. For about 15 years, he had a kidney transplant but he has been back on dialysis for over a year now. He has been catheter dependent since that time, and clearly needs a permanent dialysis access. Given his multiple failed accesses on the right side and his previous left thigh graft which has failed, this is clearly a disadvantaged access situation. He has been evaluated for a fistula at [facility] in the left arm but told he was not a candidate for this. Given his long-standing catheter dependence, I would also have concern about central venous stenosis. I have discussed several options for access placement including a right arm HeRO graft or a new fistula on the left arm if a vein was available. I believe that a venogram would be a good option given his complex access situation and also allow [place] to evaluate the central venous circulation. I have discussed that this could help plan our permanent dialysis access option. I discussed the long-term catheter dependence is associated with a shortened survival and I would recommend a fistula or graft be placed at some point in the reasonably near future. He and his wife voiced their understanding and are agreeable with proceeding with a venogram and future access placement following venogram.

## 2016-09-22 NOTE — Assessment & Plan Note (Signed)
lipid control important in reducing the progression of atherosclerotic disease. Continue statin therapy  

## 2016-09-22 NOTE — Assessment & Plan Note (Signed)
With ESRD

## 2016-09-22 NOTE — Progress Notes (Signed)
Patient ID: Jonathon Conley, male   DOB: [birth year], 50 y.o.   MRN: [id number]  Chief Complaint  Patient presents with  . New Patient (Initial Visit)    HPI Jonathon Conley is a 50 y.o. male.  I am asked to see the patient by Dr. Smith Mince for evaluation of dialysis access options.  The patient reports no specific complaints today.  He is on dialysis on T/Th/Sat currently. The patient has long-standing end-stage renal disease of about 20 years. For about 15 years, he had a kidney transplant but he has been back on dialysis for over a year now. He has been catheter dependent since that time, and clearly needs a permanent dialysis access. Given his multiple failed accesses on the right side and his previous left thigh graft which has failed, this is clearly a disadvantaged access situation. He has been evaluated for a fistula at [facility] in the left arm but told he was not a candidate for this. He is also getting a transplant evaluation at this point and hopes to get a second transplant sometime in the future. He reports that he had early thrombosis of multiple access placements previously.   Past Medical History:  Diagnosis Date  . Blood transfusion without reported diagnosis   . Chronic kidney disease   . Colitis   . Diverticulosis 04/12/16   also seen: sigmoid and rectal erythema, path:   . Hemorrhoids 03/2016   bleeding at 04/12/16 colonoscopy, treated with  APC laser. cauterization  . Hyperlipidemia   . Hypertension   . OSA (obstructive sleep apnea) 03/27/2013  . Renal insufficiency     Past Surgical History:  Procedure Laterality Date  . COLONOSCOPY N/A 04/12/2016   Procedure: COLONOSCOPY;  Surgeon: Mauri Pole, MD;  Location: [facility] ENDOSCOPY;  Service: Endoscopy;  Laterality: N/A;  . HOT HEMOSTASIS N/A 04/12/2016   Procedure: HOT HEMOSTASIS (ARGON PLASMA COAGULATION/BICAP);  Surgeon: Mauri Pole, MD;  Location[facility] ENDOSCOPY;  Service: Endoscopy;  Laterality: N/A;  . KIDNEY TRANSPLANT       Family History  Problem Relation Age of Onset  . Heart disease Mother   . Cancer Mother     ovarian  . Cancer Maternal Grandmother   . Cancer Maternal Grandfather   . Asthma Son   . Asthma Son     Social History Social History  Substance Use Topics  . Smoking status: Never Smoker  . Smokeless tobacco: Never Used  . Alcohol use No  Married  No Known Allergies  Current Outpatient Prescriptions  Medication Sig Dispense Refill  . amLODipine (NORVASC) 10 MG tablet Take 10 mg by mouth daily.    Marland Kitchen aspirin EC 81 MG tablet Take 81 mg by mouth at bedtime.    . calcitRIOL (ROCALTROL) 0.5 MCG capsule Take 1 capsule (0.5 mcg total) by mouth Every Tuesday,Thursday,and Saturday with dialysis. 15 capsule 0  . calcium acetate (PHOSLO) 667 MG capsule Take 667 mg by mouth 3 (three) times daily with meals.    . carvedilol (COREG) 25 MG tablet Take 25 mg by mouth 2 (two) times daily.   0  . cinacalcet (SENSIPAR) 30 MG tablet Take 30 mg by mouth daily.    . ferrous sulfate 325 (65 FE) MG tablet Take 325 mg by mouth 3 (three) times daily with meals. Reported on 04/09/2016    . hydrALAZINE (APRESOLINE) 50 MG tablet Take 25 mg by mouth 3 (three) times daily. Reported on 04/09/2016    . Mesalamine 800 MG  TBEC Take 1 tablet (800 mg total) by mouth 3 (three) times daily. 90 tablet 6  . simvastatin (ZOCOR) 5 MG tablet Take 1 tablet (5 mg total) by mouth at bedtime. 30 tablet 0  . doxazosin (CARDURA) 8 MG tablet Take 8 mg by mouth daily.    . hydrocortisone (ANUSOL-HC) 25 MG suppository Place 1 suppository (25 mg total) rectally 2 (two) times daily. (Patient not taking: Reported on 09/22/2016) 12 suppository 0  . mesalamine (CANASA) 1000 MG suppository Place 1 suppository (1,000 mg total) rectally at bedtime. Use for 4 weeks. (Patient not taking: Reported on 09/22/2016) 30 suppository 0  . predniSONE (DELTASONE) 10 MG tablet Take 2 tablets (20 mg total) by mouth daily with breakfast. (Patient not taking:  Reported on 09/22/2016) 100 tablet 0  . sevelamer carbonate (RENVELA) 800 MG tablet Take 2,400 mg by mouth 3 (three) times daily with meals. Reported on 04/09/2016     No current facility-administered medications for this visit.       REVIEW OF SYSTEMS (Negative unless checked)  Constitutional: [] Weight loss  [] Fever  [] Chills Cardiac: [] Chest pain   [] Chest pressure   [] Palpitations   [] Shortness of breath when laying flat   [] Shortness of breath at rest   [] Shortness of breath with exertion. Vascular:  [] Pain in legs with walking   [] Pain in legs at rest   [] Pain in legs when laying flat   [] Claudication   [] Pain in feet when walking  [] Pain in feet at rest  [] Pain in feet when laying flat   [] History of DVT   [] Phlebitis   [] Swelling in legs   [] Varicose veins   [] Non-healing ulcers Pulmonary:   [] Uses home oxygen   [] Productive cough   [] Hemoptysis   [] Wheeze  [] COPD   [] Asthma Neurologic:  [] Dizziness  [] Blackouts   [] Seizures   [] History of stroke   [] History of TIA  [] Aphasia   [] Temporary blindness   [] Dysphagia   [] Weakness or numbness in arms   [] Weakness or numbness in legs Musculoskeletal:  [] Arthritis   [] Joint swelling   [] Joint pain   [] Low back pain Hematologic:  [] Easy bruising  [] Easy bleeding   [] Hypercoagulable state   [x] Anemic  [] Hepatitis Gastrointestinal:  [] Blood in stool   [] Vomiting blood  [] Gastroesophageal reflux/heartburn   [] Abdominal pain Genitourinary:  [x] Chronic kidney disease   [] Difficult urination  [] Frequent urination  [] Burning with urination   [] Hematuria Skin:  [] Rashes   [] Ulcers   [] Wounds Psychological:  [] History of anxiety   []  History of major depression.    Physical Exam BP 133/83   Pulse 88   Resp 17   Ht 5' 10"  (1.778 m)   Wt 179 lb 6.4 oz (81.4 kg)   BMI 25.74 kg/m  Gen:  WD/WN, NAD Head: [place]/AT, No temporalis wasting. Prominent temp pulse not noted. Ear/Nose/Throat: Hearing grossly intact, nares w/o erythema or drainage, oropharynx  w/o Erythema/Exudate Eyes: Conjunctiva clear, sclera non-icteric  Neck: trachea midline.  No JVD.  Pulmonary:  Good air movement, equal bilaterally with no use of accessory muscles Cardiac: RRR, normal S1, S2 Vascular: PermCath exiting in the right subclavicular region Vessel Right Left  Radial Palpable Palpable                                   Gastrointestinal: soft, non-tender/non-distended. No guarding/reflex. No masses, surgical incisions, or scars. Musculoskeletal: M/S 5/5 throughout.  Extremities without ischemic changes.  No deformity or atrophy. Multiple incisions from previous access placements on the right arm. Neurologic: Sensation grossly intact in extremities.  Symmetrical.  Speech is fluent. Motor exam as listed above. Psychiatric: Judgment intact, Mood & affect appropriate for pt's clinical situation. Dermatologic: No rashes or ulcers noted.  No cellulitis or open wounds. Lymph : No Cervical, Axillary, or Inguinal lymphadenopathy.   Radiology No results found.  Labs No results found for this or any previous visit (from the past 2160 hour(s)).  Assessment/Plan:  Hyperlipidemia lipid control important in reducing the progression of atherosclerotic disease. Continue statin therapy   Chronic anemia With ESRD  Malignant hypertension blood pressure control important in reducing the progression of atherosclerotic disease. On appropriate oral medications.   ESRD on dialysis [facility]) The patient has long-standing end-stage renal disease of about 20 years. For about 15 years, he had a kidney transplant but he has been back on dialysis for over a year now. He has been catheter dependent since that time, and clearly needs a permanent dialysis access. Given his multiple failed accesses on the right side and his previous left thigh graft which has failed, this is clearly a disadvantaged access situation. He has been evaluated for a fistula at [facility] in the left arm but told  he was not a candidate for this. Given his long-standing catheter dependence, I would also have concern about central venous stenosis. I have discussed several options for access placement including a right arm HeRO graft or a new fistula on the left arm if a vein was available. I believe that a venogram would be a good option given his complex access situation and also allow [place] to evaluate the central venous circulation. I have discussed that this could help plan our permanent dialysis access option. I discussed the long-term catheter dependence is associated with a shortened survival and I would recommend a fistula or graft be placed at some point in the reasonably near future. He and his wife voiced their understanding and are agreeable with proceeding with a venogram and future access placement following venogram.      Leotis Pain 09/22/2016, 9:57 AM   This note was created with Dragon medical transcription system.  Any errors from dictation are unintentional.

## 2016-09-22 NOTE — Patient Instructions (Signed)
Dialysis Dialysis is a procedure that replaces some of the work healthy kidneys do. It is done when you lose about 85-90% of your kidney function. It may also be done earlier if your symptoms may be improved by dialysis. During dialysis, wastes, salt, and extra water are removed from the blood, and the levels of certain chemicals in the blood (such as potassium) are maintained. Dialysis is done in sessions. Dialysis sessions are continued until the kidneys get better. If the kidneys cannot get better, such as in end-stage kidney disease, dialysis is continued for life or until you receive a new kidney (kidney transplant). There are two types of dialysis: hemodialysis and peritoneal dialysis. What is hemodialysis? Hemodialysis is a type of dialysis in which a machine called a dialyzer is used to filter the blood. Before beginning hemodialysis, you will have surgery to create a site where blood can be removed from the body and returned to the body (vascular access). There are three types of vascular accesses:  Arteriovenous fistula. To create this type of access, an artery is connected to a vein (usually in the arm). A fistula takes 1-6 months to develop after surgery. If it develops properly, it usually lasts longer than the other types of vascular accesses. It is also less likely to become infected and cause blood clots.  Arteriovenous graft. To create this type of access, an artery and a vein in the arm are connected with a tube. A graft may be used within 2-3 weeks of surgery.  A venous catheter. To create this type of access, a thin, flexible tube (catheter) is placed in a large vein in your neck, chest, or groin. A catheter may be used right away. It is usually used as a temporary access when dialysis needs to begin immediately. During hemodialysis, blood leaves the body through your access. It travels through a tube to the dialyzer, where it is filtered. The blood then returns to your body through  another tube. Hemodialysis is usually performed by a health care provider at a hospital or dialysis center three times a week. Visits last about 3-4 hours. It may also be performed with the help of another person at home with training. What is peritoneal dialysis? Peritoneal dialysis is a type of dialysis in which the thin lining of the abdomen (peritoneum) is used as a filter. Before beginning peritoneal dialysis, you will have surgery to place a catheter in your abdomen. The catheter will be used to transfer a fluid called dialysate to and from your abdomen. At the start of a session, your abdomen is filled with dialysate. During the session, wastes, salt, and extra water in the blood pass through the peritoneum and into the dialysate. The dialysate is drained from the body at the end of the session. The process of filling and draining the dialysate is called an exchange. Exchanges are repeated until you have used up all the dialysate for the day. Peritoneal dialysis may be performed by you at home or at almost any other location. It is done every day. You may need up to five exchanges a day. The amount of time the dialysate is in your body between exchanges is called a dwell. The dwell depends on the number of exchanges needed and the characteristics of the peritoneum. It usually varies from 1.5-3 hours. You may go about your day normally between exchanges. Alternately, the exchanges may be done at night while you sleep, using a machine called a cycler. Which type of dialysis  should I choose? Both hemodialysis and peritoneal dialysis have advantages and disadvantages. Talk to your health care provider about which type of dialysis would be best for you. Your lifestyle and preferences should be considered along with your medical condition. In some cases, only one type of dialysis may be an option. Advantages of hemodialysis  It is done less often than peritoneal dialysis.  Someone else can do the  dialysis for you.  If you go to a dialysis center, your health care provider will be able to recognize any problems right away.  If you go to a dialysis center, you can interact with others who are having dialysis. This can provide you with emotional support. Disadvantages of hemodialysis  Hemodialysis may cause cramps and low blood pressure. It may leave you feeling tired on the days you have the treatment.  If you go to a dialysis center, you will need to make weekly appointments and work around the center's schedule.  You will need to take extra care when traveling. If you go to a dialysis center, you will need to make special arrangements to visit a dialysis center near your destination. If you are having treatments at home, you will need to take the dialyzer with you to your destination.  You will need to avoid more foods than you would need to avoid on peritoneal dialysis. Advantages of peritoneal dialysis  It is less likely than hemodialysis to cause cramps and low blood pressure.  You may do exchanges on your own wherever you are, including when you travel.  You do not need to avoid as many foods as you do on hemodialysis. Disadvantages of peritoneal dialysis  It is done more often than hemodialysis.  Performing peritoneal dialysis requires you to have dexterity of the hands. You must also be able to lift bags.  You will have to learn sterilization techniques. You will need to practice them every day to reduce the risk of infection. What changes will I need to make to my diet during dialysis? Both hemodialysis and peritoneal dialysis require you to make some changes to your diet. For example, you will need to limit your intake of foods high in the minerals phosphorus and potassium. You will also need to limit your fluid intake. Your dietitian can help you plan meals. A good meal plan can improve your dialysis and your health. What should I expect when beginning  dialysis? Adjusting to the dialysis treatment, schedule, and diet can take some time. You may need to stop working and may not be able to do some of the things you normally do. You may feel anxious or depressed when beginning dialysis. Eventually, many people feel better overall because of dialysis. Some people are able to return to work after making some changes, such as reducing work intensity. Where can I find more information?  [place]: www.kidney.org  American Association of Kidney Patients: BombTimer.gl  [employer]: www.kidneyfund.org This information is not intended to replace advice given to you by your health care provider. Make sure you discuss any questions you have with your health care provider. Document Released: 12/23/2002 Document Revised: 03/09/2016 Document Reviewed: 11/26/2012 Elsevier Interactive Patient Education  2017 [employer].

## 2016-09-22 NOTE — Assessment & Plan Note (Signed)
blood pressure control important in reducing the progression of atherosclerotic disease. On appropriate oral medications.  

## 2016-09-23 DIAGNOSIS — N186 End stage renal disease: Secondary | ICD-10-CM | POA: Diagnosis not present

## 2016-09-23 DIAGNOSIS — D631 Anemia in chronic kidney disease: Secondary | ICD-10-CM | POA: Diagnosis not present

## 2016-09-25 ENCOUNTER — Other Ambulatory Visit (INDEPENDENT_AMBULATORY_CARE_PROVIDER_SITE_OTHER): Payer: Self-pay | Admitting: Vascular Surgery

## 2016-09-26 DIAGNOSIS — D631 Anemia in chronic kidney disease: Secondary | ICD-10-CM | POA: Diagnosis not present

## 2016-09-26 DIAGNOSIS — N186 End stage renal disease: Secondary | ICD-10-CM | POA: Diagnosis not present

## 2016-09-28 DIAGNOSIS — D631 Anemia in chronic kidney disease: Secondary | ICD-10-CM | POA: Diagnosis not present

## 2016-09-28 DIAGNOSIS — N186 End stage renal disease: Secondary | ICD-10-CM | POA: Diagnosis not present

## 2016-10-02 ENCOUNTER — Encounter: Payer: Self-pay | Admitting: *Deleted

## 2016-10-02 ENCOUNTER — Encounter
Admission: RE | Disposition: A | Discharge status: Home / Self Care | Payer: Self-pay | Source: Ambulatory Visit | Attending: Vascular Surgery

## 2016-10-02 ENCOUNTER — Ambulatory Visit
Admission: RE | Admit: 2016-10-02 | Discharge: 2016-10-02 | Disposition: A | Discharge status: Home / Self Care | Payer: Medicare Other | Source: Ambulatory Visit | Attending: Vascular Surgery | Admitting: Vascular Surgery

## 2016-10-02 DIAGNOSIS — Z825 Family history of asthma and other chronic lower respiratory diseases: Secondary | ICD-10-CM | POA: Diagnosis not present

## 2016-10-02 DIAGNOSIS — Z7982 Long term (current) use of aspirin: Secondary | ICD-10-CM | POA: Diagnosis not present

## 2016-10-02 DIAGNOSIS — D631 Anemia in chronic kidney disease: Secondary | ICD-10-CM | POA: Diagnosis not present

## 2016-10-02 DIAGNOSIS — G4733 Obstructive sleep apnea (adult) (pediatric): Secondary | ICD-10-CM | POA: Insufficient documentation

## 2016-10-02 DIAGNOSIS — T82898A Other specified complication of vascular prosthetic devices, implants and grafts, initial encounter: Secondary | ICD-10-CM | POA: Diagnosis not present

## 2016-10-02 DIAGNOSIS — Y838 Other surgical procedures as the cause of abnormal reaction of the patient, or of later complication, without mention of misadventure at the time of the procedure: Secondary | ICD-10-CM | POA: Diagnosis not present

## 2016-10-02 DIAGNOSIS — Z992 Dependence on renal dialysis: Secondary | ICD-10-CM | POA: Diagnosis not present

## 2016-10-02 DIAGNOSIS — I120 Hypertensive chronic kidney disease with stage 5 chronic kidney disease or end stage renal disease: Secondary | ICD-10-CM | POA: Insufficient documentation

## 2016-10-02 DIAGNOSIS — Z8249 Family history of ischemic heart disease and other diseases of the circulatory system: Secondary | ICD-10-CM | POA: Insufficient documentation

## 2016-10-02 DIAGNOSIS — N186 End stage renal disease: Secondary | ICD-10-CM | POA: Insufficient documentation

## 2016-10-02 DIAGNOSIS — Z809 Family history of malignant neoplasm, unspecified: Secondary | ICD-10-CM | POA: Diagnosis not present

## 2016-10-02 DIAGNOSIS — E785 Hyperlipidemia, unspecified: Secondary | ICD-10-CM | POA: Insufficient documentation

## 2016-10-02 DIAGNOSIS — Z8041 Family history of malignant neoplasm of ovary: Secondary | ICD-10-CM | POA: Insufficient documentation

## 2016-10-02 HISTORY — PX: PERIPHERAL VASCULAR CATHETERIZATION: SHX172C

## 2016-10-02 LAB — POTASSIUM (ARMC VASCULAR LAB ONLY): POTASSIUM (ARMC VASCULAR LAB): 6.9 — AB (ref 3.5–5.1)

## 2016-10-02 SURGERY — UPPER EXTREMITY VENOGRAPHY
Anesthesia: Moderate Sedation | Laterality: Bilateral

## 2016-10-02 MED ORDER — SODIUM CHLORIDE 0.9 % IV SOLN
INTRAVENOUS | Status: DC
  Administered 2016-10-02: 09:00:00 via INTRAVENOUS

## 2016-10-02 MED ORDER — GUAIFENESIN-DM 100-10 MG/5ML PO SYRP: 15.0000 mL | ORAL_SOLUTION | ORAL | Status: DC | PRN

## 2016-10-02 MED ORDER — MORPHINE SULFATE (PF) 4 MG/ML IV SOLN: 2.0000 mg | INTRAVENOUS | Status: DC | PRN

## 2016-10-02 MED ORDER — OXYCODONE-ACETAMINOPHEN 5-325 MG PO TABS: 1.0000 | ORAL_TABLET | ORAL | Status: DC | PRN

## 2016-10-02 MED ORDER — FENTANYL CITRATE (PF) 100 MCG/2ML IJ SOLN
INTRAMUSCULAR | Status: DC | PRN
  Administered 2016-10-02: 25 ug via INTRAVENOUS
  Administered 2016-10-02: 50 ug via INTRAVENOUS

## 2016-10-02 MED ORDER — LIDOCAINE HCL (PF) 1 % IJ SOLN
INTRAMUSCULAR | Status: AC
  Filled 2016-10-02: qty 10

## 2016-10-02 MED ORDER — FAMOTIDINE 20 MG PO TABS: 40.0000 mg | ORAL_TABLET | ORAL | Status: DC | PRN

## 2016-10-02 MED ORDER — HYDROMORPHONE HCL 1 MG/ML IJ SOLN: 1.0000 mg | Freq: Once | INTRAMUSCULAR | Status: DC

## 2016-10-02 MED ORDER — SODIUM CHLORIDE 0.9 % IV SOLN: 500.0000 mL | Freq: Once | INTRAVENOUS | Status: DC | PRN

## 2016-10-02 MED ORDER — PHENOL 1.4 % MT LIQD
1.0000 | OROMUCOSAL | Status: DC | PRN
  Filled 2016-10-02: qty 177

## 2016-10-02 MED ORDER — HYDRALAZINE HCL 20 MG/ML IJ SOLN: 5.0000 mg | INTRAMUSCULAR | Status: DC | PRN

## 2016-10-02 MED ORDER — CEFAZOLIN IN D5W 1 GM/50ML IV SOLN
1.0000 g | Freq: Once | INTRAVENOUS | Status: AC
  Administered 2016-10-02: 1 g via INTRAVENOUS

## 2016-10-02 MED ORDER — METOPROLOL TARTRATE 5 MG/5ML IV SOLN: 2.0000 mg | INTRAVENOUS | Status: DC | PRN

## 2016-10-02 MED ORDER — LIDOCAINE-EPINEPHRINE 1 %-1:100000 IJ SOLN
INTRAMUSCULAR | Status: AC
  Filled 2016-10-02: qty 1

## 2016-10-02 MED ORDER — HEPARIN (PORCINE) IN NACL 2-0.9 UNIT/ML-% IJ SOLN
INTRAMUSCULAR | Status: AC
  Filled 2016-10-02: qty 1000

## 2016-10-02 MED ORDER — MIDAZOLAM HCL 5 MG/5ML IJ SOLN
INTRAMUSCULAR | Status: AC
  Filled 2016-10-02: qty 5

## 2016-10-02 MED ORDER — ACETAMINOPHEN 325 MG PO TABS: 325.0000 mg | ORAL_TABLET | ORAL | Status: DC | PRN

## 2016-10-02 MED ORDER — MIDAZOLAM HCL 2 MG/2ML IJ SOLN
INTRAMUSCULAR | Status: DC | PRN
  Administered 2016-10-02: 1 mg via INTRAVENOUS
  Administered 2016-10-02: 2 mg via INTRAVENOUS

## 2016-10-02 MED ORDER — HEPARIN SODIUM (PORCINE) 1000 UNIT/ML IJ SOLN
INTRAMUSCULAR | Status: AC
  Filled 2016-10-02: qty 1

## 2016-10-02 MED ORDER — ALUM & MAG HYDROXIDE-SIMETH 200-200-20 MG/5ML PO SUSP: 15.0000 mL | ORAL | Status: DC | PRN

## 2016-10-02 MED ORDER — DEXTROSE 5 % IV SOLN: 1.5000 g | INTRAVENOUS | Status: DC

## 2016-10-02 MED ORDER — ONDANSETRON HCL 4 MG/2ML IJ SOLN: 4.0000 mg | Freq: Four times a day (QID) | INTRAMUSCULAR | Status: DC | PRN

## 2016-10-02 MED ORDER — LABETALOL HCL 5 MG/ML IV SOLN: 10.0000 mg | INTRAVENOUS | Status: DC | PRN

## 2016-10-02 MED ORDER — ACETAMINOPHEN 325 MG RE SUPP: 325.0000 mg | RECTAL | Status: DC | PRN

## 2016-10-02 MED ORDER — FENTANYL CITRATE (PF) 100 MCG/2ML IJ SOLN
INTRAMUSCULAR | Status: AC
  Filled 2016-10-02: qty 2

## 2016-10-02 MED ORDER — DIPHENHYDRAMINE HCL 50 MG/ML IJ SOLN
INTRAMUSCULAR | Status: AC
  Filled 2016-10-02: qty 1

## 2016-10-02 MED ORDER — METHYLPREDNISOLONE SODIUM SUCC 125 MG IJ SOLR: 125.0000 mg | INTRAMUSCULAR | Status: DC | PRN

## 2016-10-02 MED ORDER — DIPHENHYDRAMINE HCL 50 MG/ML IJ SOLN
INTRAMUSCULAR | Status: DC | PRN
  Administered 2016-10-02: 50 mg via INTRAVENOUS

## 2016-10-02 SURGICAL SUPPLY — 5 items
CANNULA 5F STIFF (CANNULA) ×6 IMPLANT
CATH BEACON 5.038 65CM KMP-01 (CATHETERS) ×2 IMPLANT
PACK ANGIOGRAPHY (CUSTOM PROCEDURE TRAY) ×2 IMPLANT
TOWEL OR 17X26 4PK STRL BLUE (TOWEL DISPOSABLE) ×2 IMPLANT
WIRE MAGIC TOR.035 180C (WIRE) ×2 IMPLANT

## 2016-10-02 NOTE — H&P (Signed)
[facility] History & Physical Update  The patient was interviewed and re-examined.  The patient's previous History and Physical has been reviewed and is unchanged.  There is no change in the plan of care. We plan to proceed with the scheduled procedure.  Leotis Pain, MD  10/02/2016, 8:13 AM

## 2016-10-02 NOTE — Op Note (Signed)
[facility]   OPERATIVE NOTE  DATE: 10/02/2016  PRE-OPERATIVE DIAGNOSIS: 1. ESRD 2. Difficult dialysis access   POST-OPERATIVE DIAGNOSIS: same  PROCEDURE: 1.   Ultrasound guidance for vascular access to bilateral brachial veins 2.   Catheter placement into left internal jugular vein and left innominate vein from left brachial vein 3.   Bilateral upper extremity venograms and superior venacavogram 4.   Left jugular venogram  SURGEON: Leotis Pain, MD  ASSISTANT(S): None  ANESTHESIA: Local with moderate conscious sedation for approximately 20 minutes using 3 mg of Versed and 75 mcg of Fentanyl  ESTIMATED BLOOD LOSS: minimal  FINDING(S): 1.  No obvious usable cephalic vein or basilic vein in either upper extremity. The brachial veins were patent. Imaging showed continuous flow from the brachial vein up through the axillary and subclavian veins bilaterally. On the right, there was a PermCath in place but there was flow through the innominate vein and superior vena cava and into the right atrium although there did appear to be at least a moderate degree of stenosis in the superior vena cava around the catheter. On the left imaging from the brachial vein itself showed continuous flow in the brachial vein up to the axillary and subclavian vein. The flow centrally was not particularly brisk and I wanted a further evaluation of the central venous circulation on the left as well as the jugular vein as we may have to move his PermCath to the left jugular or the femoral location if we do a right arm hero graft. I exchanged for a Kumpe catheter and a Magic torque wire. I parked Kumpe catheter in the subclavian and innominate vein which again showed brisk flow into the right atrium although there was at least a moderate degree of stenosis in the superior vena cava around the existing catheter. I was able to get the catheter up into the jugular vein and perform a jugular venogram.  The jugular vein was widely patent with brisk flow centrally.  SPECIMEN(S):  None  INDICATIONS:   Patient is a 50 y.o.male who presents with a difficult dialysis access situation. He has a history of central venous occlusions but needs a new dialysis access. Upper extremity venograms are being performed to evaluate the adequacy of the upper extremities for an AV fistula or AV graft. Risks and benefits were discussed and informed consent was obtained.   DESCRIPTION: After obtaining full informed written consent, the patient was brought back to the vascular suite and placed in supine position with her arms extended bilaterally. Moderate conscious sedation was administered during a face to face encounter with the patient throughout the procedure with my supervision of the RN administering medicines and monitoring the patient's vital signs, pulse oximetry, telemetry and mental status throughout from the start of the procedure until the patient was taken to the recovery room.  After obtaining adequate anesthesia, the patient was prepped and draped in the standard fashion in both upper extremities. I started on the right side. The right brachial vein was identified with ultrasound and found to be patent. It was then accessed under direct ultrasound guidance without difficulty with a micropuncture needle and a permanent image was recorded. A micropuncture wire and sheath were then placed. Imaging initially was performed through the micropuncture sheath which showed continuous flow from the brachial vein up through the axillary and subclavian veins bilaterally. On the right, there was a PermCath in place but there was flow through the innominate vein and superior vena  cava and into the right atrium although there did appear to be at least a moderate degree of stenosis in the superior vena cava around the catheter.   I turned my attention to the left arm. The left brachial vein was then identified with ultrasound  and found to be patent. I did not see a usable cephalic vein or basilic vein. This brachial vein was accessed under direct ultrasound guidance with a micropuncture needle and a micropuncture wire and sheath were then placed.  Imaging was performed through the micropuncture sheath. This demonstrated continuous flow in the brachial vein up to the axillary and subclavian vein. The flow centrally was not particularly brisk and I wanted a further evaluation of the central venous circulation on the left as well as the jugular vein as we may have to move his PermCath to the left jugular or the femoral location if we do a right arm hero graft. I exchanged for a Kumpe catheter and a Magic torque wire. I parked Kumpe catheter in the subclavian and innominate vein which again showed brisk flow into the right atrium although there was at least a moderate degree of stenosis in the superior vena cava around the existing catheter. I was able to get the catheter up into the jugular vein and perform a jugular venogram. The jugular vein was widely patent with brisk flow centrally.  At this point, I elected to terminate the procedure. Both sheaths were removed and pressure were held at the access site in the upper extremities. The patient was awakened from anesthesia and taken to the recovery room in stable condition.  COMPLICATIONS: None  CONDITION: Stable

## 2016-10-03 ENCOUNTER — Encounter: Payer: Self-pay | Admitting: Vascular Surgery

## 2016-10-04 ENCOUNTER — Encounter (INDEPENDENT_AMBULATORY_CARE_PROVIDER_SITE_OTHER): Payer: Self-pay

## 2016-10-05 ENCOUNTER — Other Ambulatory Visit (INDEPENDENT_AMBULATORY_CARE_PROVIDER_SITE_OTHER): Payer: Self-pay | Admitting: Vascular Surgery

## 2016-10-05 DIAGNOSIS — N186 End stage renal disease: Secondary | ICD-10-CM | POA: Diagnosis not present

## 2016-10-05 DIAGNOSIS — D631 Anemia in chronic kidney disease: Secondary | ICD-10-CM | POA: Diagnosis not present

## 2016-10-07 DIAGNOSIS — N186 End stage renal disease: Secondary | ICD-10-CM | POA: Diagnosis not present

## 2016-10-07 DIAGNOSIS — D631 Anemia in chronic kidney disease: Secondary | ICD-10-CM | POA: Diagnosis not present

## 2016-10-10 DIAGNOSIS — N186 End stage renal disease: Secondary | ICD-10-CM | POA: Diagnosis not present

## 2016-10-10 DIAGNOSIS — D631 Anemia in chronic kidney disease: Secondary | ICD-10-CM | POA: Diagnosis not present

## 2016-10-12 DIAGNOSIS — D631 Anemia in chronic kidney disease: Secondary | ICD-10-CM | POA: Diagnosis not present

## 2016-10-12 DIAGNOSIS — N186 End stage renal disease: Secondary | ICD-10-CM | POA: Diagnosis not present

## 2016-10-14 DIAGNOSIS — N186 End stage renal disease: Secondary | ICD-10-CM | POA: Diagnosis not present

## 2016-10-14 DIAGNOSIS — D631 Anemia in chronic kidney disease: Secondary | ICD-10-CM | POA: Diagnosis not present

## 2016-10-15 DIAGNOSIS — N186 End stage renal disease: Secondary | ICD-10-CM | POA: Diagnosis not present

## 2016-10-15 DIAGNOSIS — Z992 Dependence on renal dialysis: Secondary | ICD-10-CM | POA: Diagnosis not present

## 2016-10-15 DIAGNOSIS — N2581 Secondary hyperparathyroidism of renal origin: Secondary | ICD-10-CM | POA: Insufficient documentation

## 2016-10-15 DIAGNOSIS — T8610 Unspecified complication of kidney transplant: Secondary | ICD-10-CM | POA: Diagnosis not present

## 2016-10-17 DIAGNOSIS — N186 End stage renal disease: Secondary | ICD-10-CM | POA: Diagnosis not present

## 2016-10-19 DIAGNOSIS — N186 End stage renal disease: Secondary | ICD-10-CM | POA: Diagnosis not present

## 2016-10-23 ENCOUNTER — Encounter (INDEPENDENT_AMBULATORY_CARE_PROVIDER_SITE_OTHER): Payer: Self-pay

## 2016-10-23 ENCOUNTER — Encounter
Admission: RE | Admit: 2016-10-23 | Discharge: 2016-10-23 | Disposition: A | Discharge status: Home / Self Care | Payer: Medicare Other | Source: Ambulatory Visit | Attending: Vascular Surgery | Admitting: Vascular Surgery

## 2016-10-23 DIAGNOSIS — I10 Essential (primary) hypertension: Secondary | ICD-10-CM | POA: Diagnosis not present

## 2016-10-23 DIAGNOSIS — N186 End stage renal disease: Secondary | ICD-10-CM | POA: Insufficient documentation

## 2016-10-23 DIAGNOSIS — Z01818 Encounter for other preprocedural examination: Secondary | ICD-10-CM | POA: Diagnosis not present

## 2016-10-23 HISTORY — DX: Anemia, unspecified: D64.9

## 2016-10-23 LAB — CBC WITH DIFFERENTIAL/PLATELET
BASOS ABS: 0 10*3/uL (ref 0–0.1)
Basophils Relative: 0 %
Eosinophils Absolute: 0.1 10*3/uL (ref 0–0.7)
Eosinophils Relative: 4 %
HCT: 36 % — ABNORMAL LOW (ref 40.0–52.0)
HEMOGLOBIN: 11.8 g/dL — AB (ref 13.0–18.0)
LYMPHS PCT: 19 %
Lymphs Abs: 0.6 10*3/uL — ABNORMAL LOW (ref 1.0–3.6)
MCH: 28.5 pg (ref 26.0–34.0)
MCHC: 32.9 g/dL (ref 32.0–36.0)
MCV: 86.6 fL (ref 80.0–100.0)
Monocytes Absolute: 0.4 10*3/uL (ref 0.2–1.0)
Monocytes Relative: 12 %
NEUTROS PCT: 65 %
Neutro Abs: 2.2 10*3/uL (ref 1.4–6.5)
PLATELETS: 144 10*3/uL — AB (ref 150–440)
RBC: 4.16 MIL/uL — AB (ref 4.40–5.90)
RDW: 17.5 % — ABNORMAL HIGH (ref 11.5–14.5)
WBC: 3.4 10*3/uL — AB (ref 3.8–10.6)

## 2016-10-23 LAB — TYPE AND SCREEN
ABO/RH(D): A POS
ANTIBODY SCREEN: NEGATIVE

## 2016-10-23 LAB — BASIC METABOLIC PANEL
ANION GAP: 12 (ref 5–15)
BUN: 66 mg/dL — ABNORMAL HIGH (ref 6–20)
CHLORIDE: 101 mmol/L (ref 101–111)
CO2: 26 mmol/L (ref 22–32)
Calcium: 8.4 mg/dL — ABNORMAL LOW (ref 8.9–10.3)
Creatinine, Ser: 16.61 mg/dL — ABNORMAL HIGH (ref 0.61–1.24)
GFR, EST AFRICAN AMERICAN: 3 mL/min — AB (ref >60)
GFR, EST NON AFRICAN AMERICAN: 3 mL/min — AB (ref >60)
Glucose, Bld: 88 mg/dL (ref 65–99)
POTASSIUM: 5 mmol/L (ref 3.5–5.1)
SODIUM: 139 mmol/L (ref 135–145)

## 2016-10-23 LAB — SURGICAL PCR SCREEN
MRSA, PCR: NEGATIVE
STAPHYLOCOCCUS AUREUS: NEGATIVE

## 2016-10-23 LAB — PROTIME-INR
INR: 1.04
PROTHROMBIN TIME: 13.6 s (ref 11.4–15.2)

## 2016-10-23 LAB — APTT: APTT: 36 s (ref 24–36)

## 2016-10-23 MED ORDER — CHLORHEXIDINE GLUCONATE CLOTH 2 % EX PADS
6.0000 | MEDICATED_PAD | Freq: Once | CUTANEOUS | Status: DC
  Filled 2016-10-23: qty 6

## 2016-10-23 NOTE — Patient Instructions (Signed)
  Your procedure is scheduled on: 11/01/16 Wed Report to Same Day Surgery 2nd floor medical mall [facility] Entrance-take elevator on left to 2nd floor.  Check in with surgery information desk.) To find out your arrival time please call [phone] between 1PM - 3PM on 10/31/16 Tues  Remember: Instructions that are not followed completely may result in serious medical risk, up to and including death, or upon the discretion of your surgeon and anesthesiologist your surgery may need to be rescheduled.    _x___ 1. Do not eat food or drink liquids after midnight. No gum chewing or hard candies.     __x__ 2. No Alcohol for 24 hours before or after surgery.   __x__3. No Smoking for 24 prior to surgery.   ____  4. Bring all medications with you on the day of surgery if instructed.    __x__ 5. Notify your doctor if there is any change in your medical condition     (cold, fever, infections).     Do not wear jewelry, make-up, hairpins, clips or nail polish.  Do not wear lotions, powders, or perfumes. You may wear deodorant.  Do not shave 48 hours prior to surgery. Men may shave face and neck.  Do not bring valuables to the hospital.    [facility] is not responsible for any belongings or valuables.               Contacts, dentures or bridgework may not be worn into surgery.  Leave your suitcase in the car. After surgery it may be brought to your room.  For patients admitted to the hospital, discharge time is determined by your treatment team.   Patients discharged the day of surgery will not be allowed to drive home.  You will need someone to drive you home and stay with you the night of your procedure.    Please read over the following fact sheets that you were given:   [facility] Preparing for Surgery and or MRSA Information   _x___ Take these medicines the morning of surgery with A SIP OF WATER:    1. amLODipine (NORVASC  2.carvedilol (COREG  3.hydrALAZINE  (APRESOLINE  4.  5.  6.  ____Fleets enema or Magnesium Citrate as directed.   _x___ Use CHG Soap or sage wipes as directed on instruction sheet   ____ Use inhalers on the day of surgery and bring to hospital day of surgery  ____ Stop metformin 2 days prior to surgery    ____ Take 1/2 of usual insulin dose the night before surgery and none on the morning of           surgery.   ____ Stop Aspirin, Coumadin, Pllavix ,Eliquis, Effient, or Pradaxa  x__ Stop Anti-inflammatories such as Advil, Aleve, Ibuprofen, Motrin, Naproxen,          Naprosyn, Goodies powders or aspirin products. Ok to take Tylenol.   ____ Stop supplements until after surgery.    ____ Bring C-Pap to the hospital.

## 2016-10-24 DIAGNOSIS — N186 End stage renal disease: Secondary | ICD-10-CM | POA: Diagnosis not present

## 2016-10-25 DIAGNOSIS — N186 End stage renal disease: Secondary | ICD-10-CM | POA: Diagnosis not present

## 2016-10-28 DIAGNOSIS — N186 End stage renal disease: Secondary | ICD-10-CM | POA: Diagnosis not present

## 2016-10-31 DIAGNOSIS — N186 End stage renal disease: Secondary | ICD-10-CM | POA: Diagnosis not present

## 2016-11-02 DIAGNOSIS — N186 End stage renal disease: Secondary | ICD-10-CM | POA: Diagnosis not present

## 2016-11-04 DIAGNOSIS — N186 End stage renal disease: Secondary | ICD-10-CM | POA: Diagnosis not present

## 2016-11-07 DIAGNOSIS — N186 End stage renal disease: Secondary | ICD-10-CM | POA: Diagnosis not present

## 2016-11-07 MED ORDER — CEFAZOLIN SODIUM-DEXTROSE 2-4 GM/100ML-% IV SOLN
2.0000 g | INTRAVENOUS | Status: AC
  Administered 2016-11-08: 2 g via INTRAVENOUS

## 2016-11-08 ENCOUNTER — Encounter
Admission: RE | Disposition: A | Discharge status: Home / Self Care | Payer: Self-pay | Source: Ambulatory Visit | Attending: Vascular Surgery

## 2016-11-08 ENCOUNTER — Ambulatory Visit: Payer: Medicare Other | Admitting: Anesthesiology

## 2016-11-08 ENCOUNTER — Encounter: Payer: Self-pay | Admitting: *Deleted

## 2016-11-08 ENCOUNTER — Ambulatory Visit: Payer: Medicare Other

## 2016-11-08 ENCOUNTER — Ambulatory Visit
Admission: RE | Admit: 2016-11-08 | Discharge: 2016-11-08 | Disposition: A | Discharge status: Home / Self Care | Payer: Medicare Other | Source: Ambulatory Visit | Attending: Vascular Surgery | Admitting: Vascular Surgery

## 2016-11-08 DIAGNOSIS — T8241XA Breakdown (mechanical) of vascular dialysis catheter, initial encounter: Secondary | ICD-10-CM | POA: Diagnosis not present

## 2016-11-08 DIAGNOSIS — Z940 Kidney transplant status: Secondary | ICD-10-CM | POA: Diagnosis not present

## 2016-11-08 DIAGNOSIS — Z992 Dependence on renal dialysis: Secondary | ICD-10-CM | POA: Insufficient documentation

## 2016-11-08 DIAGNOSIS — I120 Hypertensive chronic kidney disease with stage 5 chronic kidney disease or end stage renal disease: Secondary | ICD-10-CM | POA: Insufficient documentation

## 2016-11-08 DIAGNOSIS — E785 Hyperlipidemia, unspecified: Secondary | ICD-10-CM | POA: Insufficient documentation

## 2016-11-08 DIAGNOSIS — N186 End stage renal disease: Secondary | ICD-10-CM | POA: Diagnosis not present

## 2016-11-08 DIAGNOSIS — I213 ST elevation (STEMI) myocardial infarction of unspecified site: Secondary | ICD-10-CM | POA: Diagnosis not present

## 2016-11-08 DIAGNOSIS — I871 Compression of vein: Secondary | ICD-10-CM

## 2016-11-08 DIAGNOSIS — I509 Heart failure, unspecified: Secondary | ICD-10-CM | POA: Diagnosis not present

## 2016-11-08 DIAGNOSIS — I10 Essential (primary) hypertension: Secondary | ICD-10-CM | POA: Diagnosis not present

## 2016-11-08 DIAGNOSIS — G4733 Obstructive sleep apnea (adult) (pediatric): Secondary | ICD-10-CM | POA: Insufficient documentation

## 2016-11-08 HISTORY — PX: VASCULAR ACCESS DEVICE INSERTION: SHX5158

## 2016-11-08 HISTORY — PX: PERIPHERAL VASCULAR CATHETERIZATION: SHX172C

## 2016-11-08 LAB — POCT I-STAT 4, (NA,K, GLUC, HGB,HCT)
GLUCOSE: 78 mg/dL (ref 65–99)
HCT: 43 % (ref 39.0–52.0)
Hemoglobin: 14.6 g/dL (ref 13.0–17.0)
Potassium: 4.2 mmol/L (ref 3.5–5.1)
SODIUM: 138 mmol/L (ref 135–145)

## 2016-11-08 LAB — ABO/RH: ABO/RH(D): A POS

## 2016-11-08 SURGERY — INSERTION, CATHETER, HERO
Anesthesia: General | Site: Shoulder | Laterality: Right | Wound class: Clean

## 2016-11-08 MED ORDER — OXYCODONE-ACETAMINOPHEN 5-325 MG PO TABS
ORAL_TABLET | ORAL | Status: AC
  Administered 2016-11-08: 1
  Filled 2016-11-08: qty 1

## 2016-11-08 MED ORDER — ONDANSETRON HCL 4 MG/2ML IJ SOLN
4.0000 mg | Freq: Once | INTRAMUSCULAR | Status: AC | PRN
  Administered 2016-11-08: 4 mg via INTRAVENOUS

## 2016-11-08 MED ORDER — SUCCINYLCHOLINE CHLORIDE 20 MG/ML IJ SOLN
INTRAMUSCULAR | Status: DC | PRN
  Administered 2016-11-08: 100 mg via INTRAVENOUS

## 2016-11-08 MED ORDER — GLYCOPYRROLATE 0.2 MG/ML IJ SOLN
INTRAMUSCULAR | Status: DC | PRN
  Administered 2016-11-08: 0.6 mg via INTRAVENOUS

## 2016-11-08 MED ORDER — LIDOCAINE HCL (CARDIAC) 20 MG/ML IV SOLN
INTRAVENOUS | Status: DC | PRN
  Administered 2016-11-08: 100 mg via INTRAVENOUS

## 2016-11-08 MED ORDER — HYDROCODONE-ACETAMINOPHEN 5-325 MG PO TABS: 1.0000 | ORAL_TABLET | Freq: Four times a day (QID) | ORAL | 0 refills | Status: DC | PRN

## 2016-11-08 MED ORDER — PHENYLEPHRINE HCL 10 MG/ML IJ SOLN
INTRAMUSCULAR | Status: AC
  Filled 2016-11-08: qty 1

## 2016-11-08 MED ORDER — FENTANYL CITRATE (PF) 100 MCG/2ML IJ SOLN
INTRAMUSCULAR | Status: AC
  Filled 2016-11-08: qty 2

## 2016-11-08 MED ORDER — NEOSTIGMINE METHYLSULFATE 10 MG/10ML IV SOLN
INTRAVENOUS | Status: DC | PRN
  Administered 2016-11-08: 4 mg via INTRAVENOUS

## 2016-11-08 MED ORDER — PHENYLEPHRINE HCL 10 MG/ML IJ SOLN
INTRAMUSCULAR | Status: DC | PRN
  Administered 2016-11-08: 150 ug via INTRAVENOUS
  Administered 2016-11-08: 200 ug via INTRAVENOUS
  Administered 2016-11-08 (×3): 100 ug via INTRAVENOUS
  Administered 2016-11-08: 150 ug via INTRAVENOUS

## 2016-11-08 MED ORDER — FENTANYL CITRATE (PF) 100 MCG/2ML IJ SOLN
25.0000 ug | INTRAMUSCULAR | Status: DC | PRN
  Administered 2016-11-08 (×2): 25 ug via INTRAVENOUS

## 2016-11-08 MED ORDER — SUCCINYLCHOLINE CHLORIDE 200 MG/10ML IV SOSY
PREFILLED_SYRINGE | INTRAVENOUS | Status: AC
  Filled 2016-11-08: qty 10

## 2016-11-08 MED ORDER — SODIUM CHLORIDE 0.9 % IV SOLN
INTRAVENOUS | Status: DC
  Administered 2016-11-08 (×2): via INTRAVENOUS

## 2016-11-08 MED ORDER — ONDANSETRON HCL 4 MG/2ML IJ SOLN
INTRAMUSCULAR | Status: AC
  Filled 2016-11-08: qty 2

## 2016-11-08 MED ORDER — MIDAZOLAM HCL 5 MG/5ML IJ SOLN
INTRAMUSCULAR | Status: DC | PRN
  Administered 2016-11-08: 2 mg via INTRAVENOUS

## 2016-11-08 MED ORDER — SODIUM CHLORIDE 0.9 % IV SOLN
INTRAVENOUS | Status: DC | PRN
  Administered 2016-11-08: 50 ug/min via INTRAVENOUS

## 2016-11-08 MED ORDER — HEPARIN SODIUM (PORCINE) 1000 UNIT/ML IJ SOLN
INTRAMUSCULAR | Status: AC
Start: 2016-11-08 — End: 2016-11-08
  Filled 2016-11-08: qty 1

## 2016-11-08 MED ORDER — ROCURONIUM BROMIDE 100 MG/10ML IV SOLN
INTRAVENOUS | Status: DC | PRN
  Administered 2016-11-08 (×2): 20 mg via INTRAVENOUS

## 2016-11-08 MED ORDER — HEPARIN SODIUM (PORCINE) 5000 UNIT/ML IJ SOLN
INTRAMUSCULAR | Status: AC
  Filled 2016-11-08: qty 1

## 2016-11-08 MED ORDER — LIDOCAINE HCL (PF) 2 % IJ SOLN
INTRAMUSCULAR | Status: AC
  Filled 2016-11-08: qty 2

## 2016-11-08 MED ORDER — NEOSTIGMINE METHYLSULFATE 5 MG/5ML IV SOSY
PREFILLED_SYRINGE | INTRAVENOUS | Status: AC
  Filled 2016-11-08: qty 5

## 2016-11-08 MED ORDER — SODIUM CHLORIDE 0.9 % IV SOLN
INTRAVENOUS | Status: DC | PRN
  Administered 2016-11-08: 19:00:00 40 mL via INTRAMUSCULAR

## 2016-11-08 MED ORDER — ROCURONIUM BROMIDE 50 MG/5ML IV SOSY
PREFILLED_SYRINGE | INTRAVENOUS | Status: AC
  Filled 2016-11-08: qty 5

## 2016-11-08 MED ORDER — FAMOTIDINE 20 MG PO TABS
20.0000 mg | ORAL_TABLET | Freq: Once | ORAL | Status: AC
  Administered 2016-11-08: 20 mg via ORAL

## 2016-11-08 MED ORDER — FENTANYL CITRATE (PF) 250 MCG/5ML IJ SOLN
INTRAMUSCULAR | Status: AC
  Filled 2016-11-08: qty 5

## 2016-11-08 MED ORDER — GLYCOPYRROLATE 0.2 MG/ML IJ SOLN
INTRAMUSCULAR | Status: AC
  Filled 2016-11-08: qty 2

## 2016-11-08 MED ORDER — ONDANSETRON HCL 4 MG/2ML IJ SOLN
INTRAMUSCULAR | Status: DC | PRN
  Administered 2016-11-08: 4 mg via INTRAVENOUS

## 2016-11-08 MED ORDER — EVICEL 2 ML EX KIT
PACK | CUTANEOUS | Status: DC | PRN
  Administered 2016-11-08: 1

## 2016-11-08 MED ORDER — PROPOFOL 10 MG/ML IV BOLUS
INTRAVENOUS | Status: DC | PRN
  Administered 2016-11-08: 150 mg via INTRAVENOUS

## 2016-11-08 MED ORDER — DEXAMETHASONE SODIUM PHOSPHATE 10 MG/ML IJ SOLN
INTRAMUSCULAR | Status: DC | PRN
  Administered 2016-11-08: 5 mg via INTRAVENOUS

## 2016-11-08 MED ORDER — MIDAZOLAM HCL 2 MG/2ML IJ SOLN
INTRAMUSCULAR | Status: AC
  Filled 2016-11-08: qty 2

## 2016-11-08 MED ORDER — FENTANYL CITRATE (PF) 100 MCG/2ML IJ SOLN
INTRAMUSCULAR | Status: DC | PRN
  Administered 2016-11-08 (×3): 50 ug via INTRAVENOUS

## 2016-11-08 MED ORDER — HEPARIN SODIUM (PORCINE) 1000 UNIT/ML IJ SOLN
INTRAMUSCULAR | Status: DC | PRN
  Administered 2016-11-08: 3000 [IU] via INTRAVENOUS

## 2016-11-08 MED ORDER — EVICEL 2 ML EX KIT
PACK | CUTANEOUS | Status: AC
  Filled 2016-11-08: qty 1

## 2016-11-08 MED ORDER — PROPOFOL 10 MG/ML IV BOLUS
INTRAVENOUS | Status: AC
  Filled 2016-11-08: qty 20

## 2016-11-08 MED ORDER — HYDRALAZINE HCL 20 MG/ML IJ SOLN
INTRAMUSCULAR | Status: DC | PRN
  Administered 2016-11-08: 5 mg via INTRAVENOUS

## 2016-11-08 SURGICAL SUPPLY — 80 items
ADH SKN CLS APL DERMABOND .7 (GAUZE/BANDAGES/DRESSINGS) ×8
BAG COUNTER SPONGE EZ (MISCELLANEOUS) ×4 IMPLANT
BAG DECANTER FOR FLEXI CONT (MISCELLANEOUS) ×6 IMPLANT
BAG SPNG 4X4 CLR HAZ (MISCELLANEOUS)
BALLN ULTRVRSE 10X60X75 (BALLOONS) ×6
BALLOON ULTRVRSE 10X60X75 (BALLOONS) ×2 IMPLANT
BIOPATCH RED 1 DISK 7.0 (GAUZE/BANDAGES/DRESSINGS) ×1 IMPLANT
BIOPATCH RED 1IN DISK 7.0MM (GAUZE/BANDAGES/DRESSINGS) ×1
BLADE CLIPPER SURG (BLADE) ×2 IMPLANT
BLADE SURG 15 STRL LF DISP TIS (BLADE) ×4 IMPLANT
BLADE SURG 15 STRL SS (BLADE) ×6
BLADE SURG SZ11 CARB STEEL (BLADE) ×6 IMPLANT
BOOT SUTURE AID YELLOW STND (SUTURE) ×6 IMPLANT
BRUSH SCRUB 4% CHG (MISCELLANEOUS) ×6 IMPLANT
CANISTER SUCT 1200ML W/VALVE (MISCELLANEOUS) ×6 IMPLANT
CATH PALINDROME RT-P 15FX23CM (CATHETERS) ×4 IMPLANT
CHLORAPREP W/TINT 26ML (MISCELLANEOUS) ×4 IMPLANT
CLIP SPRNG 6 S-JAW DBL (CLIP) ×2 IMPLANT
CLIP SPRNG 6MM S-JAW DBL (CLIP) ×6
COMPONENT HERO ACCESSORY KIT (VASCULAR PRODUCTS) ×6 IMPLANT
COMPONENT HERO ARTERIAL GRAFT (Vascular Products) ×6 IMPLANT
COMPONENT HERO VENOUS OVERFLOW (Vascular Products) ×6 IMPLANT
COUNTER SPONGE BAG EZ (MISCELLANEOUS)
COVER PROBE FLX POLY STRL (MISCELLANEOUS) ×6 IMPLANT
DERMABOND ADVANCED (GAUZE/BANDAGES/DRESSINGS) ×4
DERMABOND ADVANCED .7 DNX12 (GAUZE/BANDAGES/DRESSINGS) ×4 IMPLANT
DEVICE PRESTO INFLATION (MISCELLANEOUS) ×4 IMPLANT
DEVICE TORQUE (MISCELLANEOUS) ×3 IMPLANT
DRAPE C-ARM XRAY 36X54 (DRAPES) ×6 IMPLANT
DRAPE INCISE IOBAN 66X45 STRL (DRAPES) ×6 IMPLANT
DRAPE SHEET LG 3/4 BI-LAMINATE (DRAPES) ×6 IMPLANT
DURAPREP 26ML APPLICATOR (WOUND CARE) ×9 IMPLANT
ELECT CAUTERY BLADE 6.4 (BLADE) ×6 IMPLANT
ELECT REM PT RETURN 9FT ADLT (ELECTROSURGICAL) ×6
ELECTRODE REM PT RTRN 9FT ADLT (ELECTROSURGICAL) ×4 IMPLANT
GLIDEWIRE STIFF .35X180X3 HYDR (WIRE) ×2 IMPLANT
GLOVE BIO SURGEON STRL SZ7 (GLOVE) ×12 IMPLANT
GLOVE INDICATOR 7.5 STRL GRN (GLOVE) ×6 IMPLANT
GOWN STRL REUS W/ TWL LRG LVL3 (GOWN DISPOSABLE) ×12 IMPLANT
GOWN STRL REUS W/TWL LRG LVL3 (GOWN DISPOSABLE) ×18
HEMOSTAT SURGICEL 2X3 (HEMOSTASIS) ×6 IMPLANT
IV CATH ANGIO 12GX3 LT BLUE (NEEDLE) ×3 IMPLANT
IV NS 500ML (IV SOLUTION) ×6
IV NS 500ML BAXH (IV SOLUTION) ×4 IMPLANT
KIT RM TURNOVER STRD PROC AR (KITS) ×6 IMPLANT
LABEL OR SOLS (LABEL) ×6 IMPLANT
LOOP RED MAXI  1X406MM (MISCELLANEOUS) ×4
LOOP VESSEL MAXI 1X406 RED (MISCELLANEOUS) ×8 IMPLANT
LOOP VESSEL MINI 0.8X406 BLUE (MISCELLANEOUS) ×4 IMPLANT
LOOPS BLUE MINI 0.8X406MM (MISCELLANEOUS) ×2
NDL FILTER BLUNT 18X1 1/2 (NEEDLE) ×4 IMPLANT
NEEDLE FILTER BLUNT 18X 1/2SAF (NEEDLE) ×2
NEEDLE FILTER BLUNT 18X1 1/2 (NEEDLE) ×4 IMPLANT
PACK BASIN MAJOR ARMC (MISCELLANEOUS) ×6 IMPLANT
PACK UNIVERSAL (MISCELLANEOUS) ×6 IMPLANT
PAD PREP 24X41 OB/GYN DISP (PERSONAL CARE ITEMS) ×6 IMPLANT
SHEATH BRITE TIP 8FRX11 (SHEATH) ×2 IMPLANT
SOLUTION CELL SAVER (CLIP) ×4 IMPLANT
STOCKINETTE 48X4 2 PLY STRL (GAUZE/BANDAGES/DRESSINGS) ×3 IMPLANT
STOCKINETTE STRL 4IN 9604848 (GAUZE/BANDAGES/DRESSINGS) ×6 IMPLANT
SUT ETHIBOND 2-0 (SUTURE) ×3 IMPLANT
SUT GTX CV-6 30 (SUTURE) ×12 IMPLANT
SUT MNCRL AB 4-0 PS2 18 (SUTURE) ×9 IMPLANT
SUT PROLENE 0 CT 1 30 (SUTURE) ×6 IMPLANT
SUT PROLENE 6 0 BV (SUTURE) ×12 IMPLANT
SUT SILK 2 0 (SUTURE) ×6
SUT SILK 2 0 SH (SUTURE) ×6 IMPLANT
SUT SILK 2-0 18XBRD TIE 12 (SUTURE) ×4 IMPLANT
SUT SILK 3 0 (SUTURE) ×6
SUT SILK 3-0 18XBRD TIE 12 (SUTURE) ×4 IMPLANT
SUT SILK 4 0 (SUTURE) ×6
SUT SILK 4-0 18XBRD TIE 12 (SUTURE) ×4 IMPLANT
SUT VIC AB 3-0 SH 27 (SUTURE) ×24
SUT VIC AB 3-0 SH 27X BRD (SUTURE) ×10 IMPLANT
SUTURE MONOCRYL 2-0 UR6 VIOLOT (SUTURE) ×4 IMPLANT
SYR 20CC LL (SYRINGE) ×6 IMPLANT
SYR 3ML LL SCALE MARK (SYRINGE) ×6 IMPLANT
TOWEL OR 17X26 4PK STRL BLUE (TOWEL DISPOSABLE) ×6 IMPLANT
WIRE J 3MM .035X145CM (WIRE) ×2 IMPLANT
WIRE MAGIC TOR.035 180C (WIRE) ×3 IMPLANT

## 2016-11-08 NOTE — Transfer of Care (Signed)
Immediate Anesthesia Transfer of Care Note  Patient: Jonathon Conley  Procedure(s) Performed: Procedure(s): INSERTION OF HERO VASCULAR ACCESS DEVICE ( GRAFT ) (Right) DIALYSIS/PERMA CATHETER REMOVAL right Jugular (Right) DIALYSIS/PERMA CATHETER INSERTION left jugular with u/s guide and flouroscan (Left) IVC FILTER INSERTION (N/A)  Patient Location: PACU  Anesthesia Type:General  Level of Consciousness: awake and patient cooperative  Airway & Oxygen Therapy: Patient Spontanous Breathing and Patient connected to face mask oxygen  Post-op Assessment: Report given to RN and Post -op Vital signs reviewed and stable  Post vital signs: Reviewed and stable  Last Vitals:  Vitals:   11/08/16 1200 11/08/16 1941  BP: 132/75   Pulse: 85   Resp: 18   Temp: 37.1 C (!) (P) 35.6 C    Last Pain: There were no vitals filed for this visit.       Complications: No apparent anesthesia complications

## 2016-11-08 NOTE — Anesthesia Post-op Follow-up Note (Cosign Needed)
Anesthesia QCDR form completed.        

## 2016-11-08 NOTE — Op Note (Signed)
OPERATIVE NOTE    PRE-OPERATIVE DIAGNOSIS: 1. ESRD 2. Placement of a new HeRO graft that will not be usable for several weeks and will require removal of his old Permcath  POST-OPERATIVE DIAGNOSIS: same as above  PROCEDURE: 1. Ultrasound guidance for vascular access to the left internal jugular vein 2. Fluoroscopic guidance for placement of catheter 3. Placement of a 23 cm tip to cuff tunneled hemodialysis catheter via the left internal jugular vein  SURGEON: Leotis Pain, MD  ASSISTANT: Dr. Hortencia Pilar, MD  ANESTHESIA:  general  ESTIMATED BLOOD LOSS: 10 cc  FLUORO TIME: less than one minute  CONTRAST: none  FINDING(S): 1.  Patent left internal jugular vein  SPECIMEN(S):  None  INDICATIONS:   Jonathon Conley is a 51 y.o. male who presents with ESRD and placement of a new HeRO with removal of his old Permcath.  The patient needs long term dialysis access for their ESRD, and a Permcath is necessary.  Risks and benefits are discussed and informed consent is obtained.    DESCRIPTION: After obtaining full informed written consent, the patient was brought back to the vascular suited. The patient's left neck and chest were sterilely prepped and draped in a sterile surgical field was created. The left internal jugular vein was visualized with ultrasound and found to be patent. It was then accessed under direct ultrasound guidance and a permanent image was recorded. A wire was placed. After skin nick and dilatation, the peel-away sheath was placed over the wire. I then turned my attention to an area under the clavicle. Approximately 1-2 fingerbreadths below the clavicle a small counterincision was created and tunneled from the subclavicular incision to the access site. Using fluoroscopic guidance, a 23 centimeter tip to cuff tunneled hemodialysis catheter was selected, and tunneled from the subclavicular incision to the access site. It was then placed through the peel-away sheath and the  peel-away sheath was removed. Using fluoroscopic guidance the catheter tips were parked in the right atrium. The appropriate distal connectors were placed. It withdrew blood well and flushed easily with heparinized saline and a concentrated heparin solution was then placed. It was secured to the chest wall with 2 Prolene sutures. The access incision was closed single 4-0 Monocryl. A 4-0 Monocryl pursestring suture was placed around the exit site. Sterile dressings were placed. The patient tolerated the procedure well and we proceeded with the HeRO graft which is dictated separately.  COMPLICATIONS: None  CONDITION: Stable  Leotis Pain  11/08/2016, 7:25 PM   This note was created with Dragon Medical transcription system. Any errors in dictation are purely unintentional.

## 2016-11-08 NOTE — Evaluation (Signed)
EKG obtained by nurse. Compared to EKG from feb 2017. No significant change. No cardiac symptoms. No further eval needed.

## 2016-11-08 NOTE — Progress Notes (Signed)
Pt is NSR with ST depression.  No c/o chest pain.  Pt c/o pain in right arm at sight of surgery and Left neck at other surgical site.  Informed Dr. Marcello Moores.  12 lead EKG was ordered.  Administered 25 mcg fentanyl per order for pain in neck and arm.

## 2016-11-08 NOTE — Anesthesia Preprocedure Evaluation (Signed)
Anesthesia Evaluation  Patient identified by MRN, date of birth, ID band Patient awake    Reviewed: Allergy & Precautions, NPO status , Patient's Chart, lab work & pertinent test results  Airway Mallampati: II       Dental  (+) Teeth Intact   Pulmonary sleep apnea ,    breath sounds clear to auscultation       Cardiovascular Exercise Tolerance: Good hypertension, Pt. on medications and Pt. on home beta blockers +CHF   Rhythm:Regular Rate:Normal     Neuro/Psych negative neurological ROS     GI/Hepatic negative GI ROS, Neg liver ROS,   Endo/Other    Renal/GU ESRF and DialysisRenal disease     Musculoskeletal   Abdominal Normal abdominal exam  (+)   Peds  Hematology  (+) anemia ,   Anesthesia Other Findings   Reproductive/Obstetrics                             Anesthesia Physical Anesthesia Plan  ASA: III  Anesthesia Plan: General   Post-op Pain Management:    Induction: Intravenous  Airway Management Planned: Oral ETT  Additional Equipment:   Intra-op Plan:   Post-operative Plan: Extubation in OR  Informed Consent: I have reviewed the patients History and Physical, chart, labs and discussed the procedure including the risks, benefits and alternatives for the proposed anesthesia with the patient or authorized representative who has indicated his/her understanding and acceptance.     Plan Discussed with: CRNA and Surgeon  Anesthesia Plan Comments:         Anesthesia Quick Evaluation

## 2016-11-08 NOTE — Op Note (Signed)
[facility]  OPERATIVE NOTE   PROCEDURE:  1. Removal of right jugular PermCath. 2. Right jugular venogram and superior venacavogram. 3. Percutaneous transluminal angioplasty of the right innominate vein and superior vena cava with 10 mm diameter by 6 cm length angioplasty balloon inflated twice 4. Right arm HERO graft placement  PRE-OPERATIVE DIAGNOSIS: 1. end stage renal disease  2. Multiple failed previous dialysis accesses  POST-OPERATIVE DIAGNOSIS: same  SURGEON: Leotis Pain  ASSISTANT(S): Dr. Hortencia Pilar M.D.  ANESTHESIA: general  ESTIMATED BLOOD LOSS: 50 cc  FINDING(S): 1. Inadequate brachial artery at the antecubital fossa, adequate brachial artery in the mid upper arm. 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  SPECIMEN(S):  None  INDICATIONS:   Jonathon Conley is a 51 y.o. male who presents with end stage renal disease and multiple failed previous dialysis access sees. He is brought in for a HERO graft on the right arm. We are planning to use his existing PermCath as our conduit to the central venous circulation..  Risk, benefits, and alternatives to access surgery were discussed.  The patient is aware the risks include but are not limited to: bleeding, infection, steal syndrome, nerve damage, ischemic monomelic neuropathy, failure to mature, and need for additional procedures.  The patient is aware of the risks and elects to proceed forward.  DESCRIPTION: After full informed written consent was obtained from the patient, the patient was brought back to the operating room and placed supine upon the operating table.  The patient was given IV antibiotics prior to proceeding.  After obtaining adequate sedation, the patient was prepped and draped in standard fashion for a right arm access procedure. I started by making a small cutdown over the jugular access site from his previous  PermCath. The catheter was dissected out and encircled with a hemostat and prepared for control. It was then transected with a wire placed through the central portion of the catheter and parked through the right atrium and down into the inferior vena cava under fluoroscopic guidance. The central portion of the catheter was removed. An incision was created around the exit site of the catheter and the cuff was dissected free so that the remainder of this catheter was removed as well. An 8 French sheath was placed over the Magic torque wire. Right jugular venogram and central venogram were performed through this sheath. This demonstrated a 75-80% stenosis in the innominate vein tracking down into the superior vena cava where the old catheter existed. 2 inflations with a 10 mm diameter by 6 cm length angioplasty balloon were used to treat the innominate vein and superior vena cava. Each inflation was to approximately 8 atm for 1 minute. Completion venogram still showed about a 40-50% residual stenosis but we were now able to easily pass a peel-away sheath over the wire and into the superior vena cava. The central portion of the HERO graft was then brought onto the field. This was placed over the wire through the peel-away sheath and parked in the right atrium. The peel-away sheath was removed and the central portion of the HERO graft was clamped after it was locally heparinized.  I turned my attention first to the antecubitum.   I made an incision over the brachial artery, and dissected down through the subcutaneous tissue to the fascia carefully and was able to dissect out the brachial artery. The artery at this location was small and so we closed  this incision with a 3-0 Vicryl and a 4-0 Monocryl and made an incision in the mid upper arm. The brachial artery was dissected out at this location. The artery was patent and of adequate size to support a graft.  It was controlled proximally and distally with vessel loops  and then I turned my attention to the shoulder. Incision was created at the shoulder and the previously placed central portion of the HERO graft was tunneled from the jugular incision to the shoulder incision and clamped.  I took a Dietitian and dissected from the antecubital up to the shoulder incision.  Then I delivered the PTFE portion of the graft, through this metal tunneler and then pulled out the metal tunneler leaving the graft in place and making sure the line was up for orientation.  I then gave the patient 3000 units of heparin to gain some anticoagulation. The PTFE portion of the graft was then connected to the central venous portion of the hero graft. After waiting 3 minutes, I placed the brachial artery under tension proximally and distally with vessel loops, made an arteriotomy and extended it with a Potts scissor.  I sewed the graft to this arteriotomy with a running stitch of CV-6 suture.  At this point, then I completed the anastomosis in the usual fashion.  I released the vessel loops on the inflow and allowed the artery to decompress through the graft. There was good pulsatile flow demonstrated immediately through the HERO graft. Fluoroscopy was used to confirm the catheter tip to be in central venous location without kinking or twisting of any portion of the graft. Surgicel and Evicel were then placed. There was no more active bleeding.  The subcutaneous tissue was reapproximated with a running stitch of 3-0 Vicryl.  The skin was then reapproximated with a running subcuticular 4-0 Monocryl.  The skin was then cleaned, dried, and Dermabond used to reinforce the skin closure.  We then turned our attention to the axillary incision.  The subcutaneous tissue was repaired with running stitch of 3-0 Vicryl.  The skin was then reapproximated with running subcuticular 4-0 Monocryl.  The skin was then cleaned, dried, and then the skin closure was reinforced with Dermabond.  The patient was then  awakened from anesthesia and taken to the recovery room in stable condition having tolerated the procedure well.    COMPLICATIONS: None  CONDITION: Stable   Leotis Pain 11/08/2016 7:28 PM   This note was created with Dragon Medical transcription system. Any errors in dictation are purely unintentional.

## 2016-11-08 NOTE — Anesthesia Procedure Notes (Signed)
Procedure Name: Intubation Date/Time: 11/08/2016 5:11 PM Performed by: Dionne Bucy Pre-anesthesia Checklist: Patient identified, Patient being monitored, Timeout performed, Emergency Drugs available and Suction available Patient Re-evaluated:Patient Re-evaluated prior to inductionOxygen Delivery Method: Circle system utilized Preoxygenation: Pre-oxygenation with 100% oxygen Intubation Type: IV induction Ventilation: Mask ventilation without difficulty Laryngoscope Size: McGraph and 4 Grade View: Grade II Tube type: Oral Tube size: 7.5 mm Number of attempts: 3 Airway Equipment and Method: Stylet Placement Confirmation: ETT inserted through vocal cords under direct vision,  positive ETCO2 and breath sounds checked- equal and bilateral Secured at: 23 cm Tube secured with: Tape Dental Injury: Teeth and Oropharynx as per pre-operative assessment  Difficulty Due To: Difficult Airway- due to anterior larynx Comments: DLx1 w/MAC 4 per CRNA; grade III view noted; DL x2 w/MAC 3 per Dr. Marcello Moores without success; DL x3 per CRNA with Meredith Staggers #4; grade 2 view noted; + ETCO2 + equal  BBS; pt stable throughout DL

## 2016-11-08 NOTE — Progress Notes (Signed)
12 Lead EKG performed.  Dr. Marcello Moores at bedside to compare previous EKG.  No changes.  Pt resting on stretcher with eyes closed.  NAD.

## 2016-11-08 NOTE — H&P (Addendum)
[facility] Admission History & Physical  MRN : [id number]  Jonathon Conley is a 51 y.o. (1966/05/18) male who presents with chief complaint of No chief complaint on file. Marland Kitchen  History of Present Illness: Patient with longstanding ESRD and difficult dialysis access.  Has a right jugular Permcath in place and after a venogram for planning, will plan to place a right arm HeRO graft and put a new catheter in a new location.  He is well today without new complaints.    Current Facility-Administered Medications  Medication Dose Route Frequency Provider Last Rate Last Dose  . 0.9 %  sodium chloride infusion   Intravenous Continuous Molli Barrows, MD 10 mL/hr at 11/08/16 1232    . ceFAZolin (ANCEF) IVPB 2g/100 mL premix  2 g Intravenous On Call to [place], PA-C        Past Medical History:  Diagnosis Date  . Anemia   . Blood transfusion without reported diagnosis   . Chronic kidney disease    Dialysis Tues, Thurs, Sat  . Colitis   . Diverticulosis 04/12/16   also seen: sigmoid and rectal erythema, path:   . Hemorrhoids 03/2016   bleeding at 04/12/16 colonoscopy, treated with  APC laser. cauterization  . Hyperlipidemia   . Hypertension   . OSA (obstructive sleep apnea) 03/27/2013  . Renal insufficiency     Past Surgical History:  Procedure Laterality Date  . COLONOSCOPY N/A 04/12/2016   Procedure: COLONOSCOPY;  Surgeon: Mauri Pole, MD;  Location: [facility];  Service: Endoscopy;  Laterality: N/A;  . fistulagrams Right   . HOT HEMOSTASIS N/A 04/12/2016   Procedure: HOT HEMOSTASIS (ARGON PLASMA COAGULATION/BICAP);  Surgeon: Mauri Pole, MD;  Location: [facility];  Service: Endoscopy;  Laterality: N/A;  . KIDNEY TRANSPLANT    . PERIPHERAL VASCULAR CATHETERIZATION Bilateral 10/02/2016   Procedure: Upper Extremity Venography;  Surgeon: Algernon Huxley, MD;  Location: [facility];  Service: Cardiovascular;  Laterality: Bilateral;  .  perm a cath in Rt upper chest Right   . THORACENTESIS      Social History Social History  Substance Use Topics  . Smoking status: Never Smoker  . Smokeless tobacco: Never Used  . Alcohol use No  No IVDU  Family History Family History  Problem Relation Age of Onset  . Heart disease Mother   . Cancer Mother     ovarian  . Cancer Maternal Grandmother   . Cancer Maternal Grandfather   . Asthma Son   . Asthma Son     No Known Allergies   REVIEW OF SYSTEMS (Negative unless checked)  Constitutional: [] Weight loss  [] Fever  [] Chills Cardiac: [] Chest pain   [] Chest pressure   [] Palpitations   [] Shortness of breath when laying flat   [] Shortness of breath at rest   [] Shortness of breath with exertion. Vascular:  [] Pain in legs with walking   [] Pain in legs at rest   [] Pain in legs when laying flat   [] Claudication   [] Pain in feet when walking  [] Pain in feet at rest  [] Pain in feet when laying flat   [] History of DVT   [] Phlebitis   [] Swelling in legs   [] Varicose veins   [] Non-healing ulcers Pulmonary:   [] Uses home oxygen   [] Productive cough   [] Hemoptysis   [] Wheeze  [] COPD   [] Asthma Neurologic:  [] Dizziness  [] Blackouts   [] Seizures   [] History of stroke   [] History of TIA  []   Aphasia   [] Temporary blindness   [] Dysphagia   [] Weakness or numbness in arms   [] Weakness or numbness in legs Musculoskeletal:  [] Arthritis   [] Joint swelling   [] Joint pain   [] Low back pain Hematologic:  [] Easy bruising  [] Easy bleeding   [] Hypercoagulable state   [x] Anemic  [] Hepatitis Gastrointestinal:  [] Blood in stool   [] Vomiting blood  [] Gastroesophageal reflux/heartburn   [] Difficulty swallowing. Genitourinary:  [x] Chronic kidney disease   [] Difficult urination  [] Frequent urination  [] Burning with urination   [] Blood in urine Skin:  [] Rashes   [] Ulcers   [] Wounds Psychological:  [] History of anxiety   []  History of major depression.  Physical Examination  Vitals:   11/08/16 1200  BP: 132/75   Pulse: 85  Resp: 18  Temp: 98.7 F (37.1 C)  SpO2: 100%  Weight: 81.6 kg (180 lb)  Height: 5' 10"  (1.778 m)   Body mass index is 25.83 kg/m. Gen: WD/WN, NAD Head: [place]/AT, No temporalis wasting. Prominent temp pulse not noted. Ear/Nose/Throat: Hearing grossly intact, nares w/o erythema or drainage, oropharynx w/o Erythema/Exudate,  Eyes: Conjunctiva clear, sclera non-icteric Neck: Trachea midline.  No JVD.  Pulmonary:  Good air movement, respirations not labored, no use of accessory muscles.  Cardiac: RRR, normal S1, S2. Vascular: right jugular permcath in place Vessel Right Left  Radial Palpable Palpable                                   Gastrointestinal: soft, non-tender/non-distended. No guarding/reflex.  Musculoskeletal: M/S 5/5 throughout.  Extremities without ischemic changes.  No deformity or atrophy.  Neurologic: Sensation grossly intact in extremities.  Symmetrical.  Speech is fluent. Motor exam as listed above. Psychiatric: Judgment intact, Mood & affect appropriate for pt's clinical situation. Dermatologic: No rashes or ulcers noted.  No cellulitis or open wounds. Lymph : No Cervical, Axillary, or Inguinal lymphadenopathy.     CBC Lab Results  Component Value Date   WBC 3.4 (L) 10/23/2016   HGB 14.6 11/08/2016   HCT 43.0 11/08/2016   MCV 86.6 10/23/2016   PLT 144 (L) 10/23/2016    BMET    Component Value Date/Time   NA 138 11/08/2016 1238   K 4.2 11/08/2016 1238   CL 101 10/23/2016 0930   CO2 26 10/23/2016 0930   GLUCOSE 78 11/08/2016 1238   BUN 66 (H) 10/23/2016 0930   CREATININE 16.61 (H) 10/23/2016 0930   CREATININE 10.76 (HH) 02/14/2015 1612   CALCIUM 8.4 (L) 10/23/2016 0930   GFRNONAA 3 (L) 10/23/2016 0930   GFRAA 3 (L) 10/23/2016 0930   Estimated Creatinine Clearance: 5.5 mL/min (by C-G formula based on SCr of 16.61 mg/dL (H)).  COAG Lab Results  Component Value Date   INR 1.04 10/23/2016   INR 1.19 11/03/2015   INR 1.19  02/15/2015    Radiology No results found.   Assessment/Plan 1. ESRD.  Plan for right arm HeRO today. Will need to move catheter and place a Permcath in new location. 2. HTN. Stable on outpatient medications 3. Hyperlipidemia. Stable on outpatient medications.   Leotis Pain, MD  11/08/2016 1:35 PM

## 2016-11-09 ENCOUNTER — Encounter: Payer: Self-pay | Admitting: Vascular Surgery

## 2016-11-09 DIAGNOSIS — N186 End stage renal disease: Secondary | ICD-10-CM | POA: Diagnosis not present

## 2016-11-09 NOTE — Anesthesia Postprocedure Evaluation (Signed)
Anesthesia Post Note  Patient: Jonathon Conley  Procedure(s) Performed: Procedure(s) (LRB): INSERTION OF HERO VASCULAR ACCESS DEVICE ( GRAFT ) (Right) DIALYSIS/PERMA CATHETER REMOVAL right Jugular (Right) DIALYSIS/PERMA CATHETER INSERTION left jugular with u/s guide and flouroscan (Left) IVC FILTER INSERTION (N/A)  Patient location during evaluation: PACU Anesthesia Type: General Level of consciousness: awake and alert Pain management: pain level controlled Vital Signs Assessment: post-procedure vital signs reviewed and stable Respiratory status: spontaneous breathing, nonlabored ventilation, respiratory function stable and patient connected to nasal cannula oxygen Cardiovascular status: blood pressure returned to baseline and stable Postop Assessment: no signs of nausea or vomiting Anesthetic complications: no     Last Vitals:  Vitals:   11/08/16 2107 11/08/16 2115  BP:    Pulse: 86 83  Resp: 13 12  Temp:  36.6 C    Last Pain:  Vitals:   11/08/16 2115  PainSc: [place]

## 2016-11-11 DIAGNOSIS — N186 End stage renal disease: Secondary | ICD-10-CM | POA: Diagnosis not present

## 2016-11-12 ENCOUNTER — Encounter: Payer: Self-pay | Admitting: Vascular Surgery

## 2016-11-13 LAB — SURGICAL PATHOLOGY

## 2016-11-14 DIAGNOSIS — N186 End stage renal disease: Secondary | ICD-10-CM | POA: Diagnosis not present

## 2016-11-15 DIAGNOSIS — T8610 Unspecified complication of kidney transplant: Secondary | ICD-10-CM | POA: Diagnosis not present

## 2016-11-15 DIAGNOSIS — N186 End stage renal disease: Secondary | ICD-10-CM | POA: Diagnosis not present

## 2016-11-15 DIAGNOSIS — Z992 Dependence on renal dialysis: Secondary | ICD-10-CM | POA: Diagnosis not present

## 2016-11-16 DIAGNOSIS — D689 Coagulation defect, unspecified: Secondary | ICD-10-CM | POA: Diagnosis not present

## 2016-11-16 DIAGNOSIS — D631 Anemia in chronic kidney disease: Secondary | ICD-10-CM | POA: Diagnosis not present

## 2016-11-16 DIAGNOSIS — N2581 Secondary hyperparathyroidism of renal origin: Secondary | ICD-10-CM | POA: Diagnosis not present

## 2016-11-16 DIAGNOSIS — N186 End stage renal disease: Secondary | ICD-10-CM | POA: Diagnosis not present

## 2016-11-16 DIAGNOSIS — D509 Iron deficiency anemia, unspecified: Secondary | ICD-10-CM | POA: Diagnosis not present

## 2016-11-17 ENCOUNTER — Telehealth (INDEPENDENT_AMBULATORY_CARE_PROVIDER_SITE_OTHER): Payer: Self-pay

## 2016-11-17 ENCOUNTER — Encounter (INDEPENDENT_AMBULATORY_CARE_PROVIDER_SITE_OTHER): Payer: Self-pay | Admitting: Vascular Surgery

## 2016-11-17 NOTE — Telephone Encounter (Signed)
Patient sent in a message wanting to know if he had a broken cath in him that was taken out. Per Dr. Lucky Cowboy the patient's cath was cut into two pieces for easier removal but his procedure was not abnormal. A message was left and he can call back if more questions.

## 2016-11-18 ENCOUNTER — Emergency Department
Admission: EM | Admit: 2016-11-18 | Discharge: 2016-11-18 | Disposition: A | Discharge status: Home / Self Care | Payer: Medicare Other | Attending: Emergency Medicine | Admitting: Emergency Medicine

## 2016-11-18 ENCOUNTER — Emergency Department: Payer: Medicare Other

## 2016-11-18 DIAGNOSIS — Z7982 Long term (current) use of aspirin: Secondary | ICD-10-CM | POA: Diagnosis not present

## 2016-11-18 DIAGNOSIS — Z992 Dependence on renal dialysis: Secondary | ICD-10-CM | POA: Insufficient documentation

## 2016-11-18 DIAGNOSIS — Z452 Encounter for adjustment and management of vascular access device: Secondary | ICD-10-CM | POA: Insufficient documentation

## 2016-11-18 DIAGNOSIS — D509 Iron deficiency anemia, unspecified: Secondary | ICD-10-CM | POA: Diagnosis not present

## 2016-11-18 DIAGNOSIS — D689 Coagulation defect, unspecified: Secondary | ICD-10-CM | POA: Diagnosis not present

## 2016-11-18 DIAGNOSIS — I5033 Acute on chronic diastolic (congestive) heart failure: Secondary | ICD-10-CM | POA: Insufficient documentation

## 2016-11-18 DIAGNOSIS — J9811 Atelectasis: Secondary | ICD-10-CM | POA: Diagnosis not present

## 2016-11-18 DIAGNOSIS — Z789 Other specified health status: Secondary | ICD-10-CM

## 2016-11-18 DIAGNOSIS — N186 End stage renal disease: Secondary | ICD-10-CM | POA: Diagnosis not present

## 2016-11-18 DIAGNOSIS — D631 Anemia in chronic kidney disease: Secondary | ICD-10-CM | POA: Diagnosis not present

## 2016-11-18 DIAGNOSIS — N2581 Secondary hyperparathyroidism of renal origin: Secondary | ICD-10-CM | POA: Diagnosis not present

## 2016-11-18 DIAGNOSIS — I132 Hypertensive heart and chronic kidney disease with heart failure and with stage 5 chronic kidney disease, or end stage renal disease: Secondary | ICD-10-CM | POA: Diagnosis not present

## 2016-11-18 LAB — CBC WITH DIFFERENTIAL/PLATELET
Basophils Absolute: 0 10*3/uL (ref 0–0.1)
Basophils Relative: 1 %
EOS PCT: 3 %
Eosinophils Absolute: 0.2 10*3/uL (ref 0–0.7)
HCT: 32.4 % — ABNORMAL LOW (ref 40.0–52.0)
Hemoglobin: 10.7 g/dL — ABNORMAL LOW (ref 13.0–18.0)
LYMPHS ABS: 0.9 10*3/uL — AB (ref 1.0–3.6)
Lymphocytes Relative: 20 %
MCH: 28.2 pg (ref 26.0–34.0)
MCHC: 33 g/dL (ref 32.0–36.0)
MCV: 85.5 fL (ref 80.0–100.0)
MONO ABS: 0.5 10*3/uL (ref 0.2–1.0)
MONOS PCT: 11 %
Neutro Abs: 3 10*3/uL (ref 1.4–6.5)
Neutrophils Relative %: 65 %
PLATELETS: 204 10*3/uL (ref 150–440)
RBC: 3.79 MIL/uL — AB (ref 4.40–5.90)
RDW: 17.5 % — ABNORMAL HIGH (ref 11.5–14.5)
WBC: 4.6 10*3/uL (ref 3.8–10.6)

## 2016-11-18 LAB — BASIC METABOLIC PANEL
Anion gap: 11 (ref 5–15)
BUN: 28 mg/dL — AB (ref 6–20)
CHLORIDE: 99 mmol/L — AB (ref 101–111)
CO2: 27 mmol/L (ref 22–32)
CREATININE: 13.04 mg/dL — AB (ref 0.61–1.24)
Calcium: 8.5 mg/dL — ABNORMAL LOW (ref 8.9–10.3)
GFR calc Af Amer: 4 mL/min — ABNORMAL LOW (ref >60)
GFR calc non Af Amer: 4 mL/min — ABNORMAL LOW (ref >60)
GLUCOSE: 93 mg/dL (ref 65–99)
Potassium: 4.2 mmol/L (ref 3.5–5.1)
Sodium: 137 mmol/L (ref 135–145)

## 2016-11-18 NOTE — ED Notes (Signed)
Patient transported to X-ray by Jarrett Soho, rad tech

## 2016-11-18 NOTE — ED Triage Notes (Signed)
Patient' permacath for dialysis is clotted.  Reports that dialysis center attempted to declot it but unable to.

## 2016-11-18 NOTE — ED Notes (Addendum)
Pt reports that the dialysis center told him on Thursday that the perma-cath was not functioning at 100% at the end of his treatment. Pt states they infused cath-flow at that time in an attempt to obtain patency; pt reports he was told venous flow was completely restricted when he went for dialysis this morning.

## 2016-11-18 NOTE — ED Notes (Signed)
Patient returned from xray.

## 2016-11-18 NOTE — Discharge Instructions (Signed)
Please return to the emergency department especially for a fever, increasing shortness of breath, increasing leg swelling for generalized weakness. Please call the vascular surgeon's office on Monday at 8 AM and explained that she needed a catheter exchange for a clotted cath on the left side. Nothing by  mouth after midnight.

## 2016-11-18 NOTE — ED Provider Notes (Signed)
Time Seen: Approximately 0709  I have reviewed the triage notes  Chief Complaint: Access Clotted   History of Present Illness: Jonathon Conley is a 51 y.o. male *who was referred here to the emergency department after he went to his dialysis clinic this morning and they were unable to access this left-sided vascular catheter site. Apparently it clotted. He states that they had difficulty with it on Thursday and put some medication that hoping to free it up on today's visit. He states he did receive a treatment of his dialysis on Thursday and states she's only had mild weight gain since his last dialysis. He denies any shortness of breath, chest pain, leg pain or swelling. Patient's had recent vascular stent access on the right and has a Vas-Cath on the left. He states his right-sided access was  recently established on Wednesday Past Medical History:  Diagnosis Date  . Anemia   . Blood transfusion without reported diagnosis   . Chronic kidney disease    Dialysis Tues, Thurs, Sat  . Colitis   . Diverticulosis 04/12/16   also seen: sigmoid and rectal erythema, path:   . Hemorrhoids 03/2016   bleeding at 04/12/16 colonoscopy, treated with  APC laser. cauterization  . Hyperlipidemia   . Hypertension   . OSA (obstructive sleep apnea) 03/27/2013  . Renal insufficiency     Patient Active Problem List   Diagnosis Date Noted  . Hematochezia 04/08/2016  . HTN (hypertension) 04/08/2016  . Intravenous line infection ([place]) 04/08/2016  . Insomnia 01/04/2016  . Malignant hypertension 12/14/2015  . Symptomatic anemia 11/03/2015  . Chronic anemia 11/03/2015  . Thrombocytopenia ([place]) 11/03/2015  . Pleural effusion on right 11/03/2015  . Shortness of breath 11/02/2015  . Sepsis ([place]) 09/23/2015  . ESRD on dialysis ([place]) 09/22/2015  . Accelerated hypertension 09/22/2015  . Leg edema, left   . HCAP (healthcare-associated pneumonia) 09/21/2015  . Bacteremia due to Gram-positive bacteria 08/20/2015   . Acute blood loss anemia 08/12/2015  . C. difficile colitis 08/12/2015  . Bleeding gastrointestinal 08/12/2015  . Acute-on-chronic renal failure ([place]) 08/10/2015  . H/O kidney transplant 08/08/2015  . Non compliance with medical treatment 08/08/2015  . History of anemia 08/08/2015  . AKI (acute kidney injury) ([place]) 02/14/2015  . Renal transplant recipient 02/14/2015  . Acute on chronic diastolic congestive heart failure ([place]) 02/14/2015  . Anemia 02/14/2015  . Hyperlipidemia   . OSA (obstructive sleep apnea) 03/27/2013    Past Surgical History:  Procedure Laterality Date  . COLONOSCOPY N/A 04/12/2016   Procedure: COLONOSCOPY;  Surgeon: Mauri Pole, MD;  Location: [place];  Service: Endoscopy;  Laterality: N/A;  . fistulagrams Right   . HOT HEMOSTASIS N/A 04/12/2016   Procedure: HOT HEMOSTASIS (ARGON PLASMA COAGULATION/BICAP);  Surgeon: Mauri Pole, MD;  Location: [facility] ENDOSCOPY;  Service: Endoscopy;  Laterality: N/A;  . KIDNEY TRANSPLANT    . PERIPHERAL VASCULAR CATHETERIZATION Bilateral 10/02/2016   Procedure: Upper Extremity Venography;  Surgeon: Algernon Huxley, MD;  Location: [place] [facility];  Service: Cardiovascular;  Laterality: Bilateral;  . PERIPHERAL VASCULAR CATHETERIZATION Right 11/08/2016   Procedure: DIALYSIS/PERMA CATHETER REMOVAL right Jugular;  Surgeon: Algernon Huxley, MD;  Location: [facility] ORS;  Service: Vascular;  Laterality: Right;  . PERIPHERAL VASCULAR CATHETERIZATION Left 11/08/2016   Procedure: DIALYSIS/PERMA CATHETER INSERTION left jugular with u/s guide and flouroscan;  Surgeon: Algernon Huxley, MD;  Location: [facility] ORS;  Service: Vascular;  Laterality: Left;  . PERIPHERAL VASCULAR CATHETERIZATION N/A 11/08/2016  Procedure: IVC FILTER INSERTION;  Surgeon: Algernon Huxley, MD;  Location: [facility] ORS;  Service: Vascular;  Laterality: N/A;  . perm a cath in Rt upper chest Right   . THORACENTESIS    . VASCULAR ACCESS DEVICE INSERTION Right 11/08/2016   Procedure:  INSERTION OF HERO VASCULAR ACCESS DEVICE ( GRAFT );  Surgeon: Algernon Huxley, MD;  Location: [facility] ORS;  Service: Vascular;  Laterality: Right;    Past Surgical History:  Procedure Laterality Date  . COLONOSCOPY N/A 04/12/2016   Procedure: COLONOSCOPY;  Surgeon: Mauri Pole, MD;  Location: [place] [facility]OSCOPY;  Service: Endoscopy;  Laterality: N/A;  . fistulagrams Right   . HOT HEMOSTASIS N/A 04/12/2016   Procedure: HOT HEMOSTASIS (ARGON PLASMA COAGULATION/BICAP);  Surgeon: Mauri Pole, MD;  Location: [facility] ENDOSCOPY;  Service: Endoscopy;  Laterality: N/A;  . KIDNEY TRANSPLANT    . PERIPHERAL VASCULAR CATHETERIZATION Bilateral 10/02/2016   Procedure: Upper Extremity Venography;  Surgeon: Algernon Huxley, MD;  Location: [place] [facility];  Service: Cardiovascular;  Laterality: Bilateral;  . PERIPHERAL VASCULAR CATHETERIZATION Right 11/08/2016   Procedure: DIALYSIS/PERMA CATHETER REMOVAL right Jugular;  Surgeon: Algernon Huxley, MD;  Location: [facility] ORS;  Service: Vascular;  Laterality: Right;  . PERIPHERAL VASCULAR CATHETERIZATION Left 11/08/2016   Procedure: DIALYSIS/PERMA CATHETER INSERTION left jugular with u/s guide and flouroscan;  Surgeon: Algernon Huxley, MD;  Location: [facility] ORS;  Service: Vascular;  Laterality: Left;  . PERIPHERAL VASCULAR CATHETERIZATION N/A 11/08/2016   Procedure: IVC FILTER INSERTION;  Surgeon: Algernon Huxley, MD;  Location: [facility] ORS;  Service: Vascular;  Laterality: N/A;  . perm a cath in Rt upper chest Right   . THORACENTESIS    . VASCULAR ACCESS DEVICE INSERTION Right 11/08/2016   Procedure: INSERTION OF HERO VASCULAR ACCESS DEVICE ( GRAFT );  Surgeon: Algernon Huxley, MD;  Location: [facility] ORS;  Service: Vascular;  Laterality: Right;    Current Outpatient Rx  . Order #: [id number] Class: Historical Med  . Order #: [id number] Class: Historical Med  . Order #: [id number] Class: Print  . Order #: [id number] Class: Historical Med  . Order #: [id number] Class: Historical Med  . Order #:  [id number] Class: Historical Med  . Order #: [id number] Class: Historical Med  . Order #: [id number] Class: Historical Med  . Order #: [id number] Class: Print  . Order #: [id number] Class: Normal  . Order #: [id number] Class: Print    Allergies:  Patient has no known allergies.  Family History: Family History  Problem Relation Age of Onset  . Heart disease Mother   . Cancer Mother     ovarian  . Cancer Maternal Grandmother   . Cancer Maternal Grandfather   . Asthma Son   . Asthma Son     Social History: Social History  Substance Use Topics  . Smoking status: Never Smoker  . Smokeless tobacco: Never Used  . Alcohol use No     Review of Systems:   10 point review of systems was performed and was otherwise negative:  Constitutional: No fever Eyes: No visual disturbances ENT: No sore throat, ear pain Cardiac: No chest pain Respiratory: No shortness of breath, wheezing, or stridor Abdomen: No abdominal pain, no vomiting, No diarrhea Endocrine: No weight loss, No night sweats Extremities: No peripheral edema, cyanosis Skin: No rashes, easy bruising Neurologic: No focal weakness, trouble with speech or swollowing Urologic: Patient is an anuric   Physical Exam:  ED Triage Vitals  Enc Vitals Group     BP 11/18/16 0641 (!) 154/104  Pulse Rate 11/18/16 0641 89     Resp 11/18/16 0641 20     Temp 11/18/16 0641 97.9 F (36.6 C)     Temp Source 11/18/16 0641 Oral     SpO2 11/18/16 0641 100 %     Weight --      Height --      Head Circumference --      Peak Flow --      Pain Score 11/18/16 0640 3     Pain Loc --      Pain Edu? --      Excl. in [place]? --     General: Awake , Alert , and Oriented times 3; GCS 15 Head: Normal cephalic , atraumatic Eyes: Pupils equal , round, reactive to light Nose/Throat: No nasal drainage, patent upper airway without erythema or exudate.  Neck: Supple, Full range of motion, No anterior adenopathy or palpable thyroid masses Lungs: Clear to  ascultation without wheezes , rhonchi, or rales Heart: Regular rate, regular rhythm without murmurs , gallops , or rubs Abdomen: Soft, non tender without rebound, guarding , or rigidity; bowel sounds positive and symmetric in all 4 quadrants. No organomegaly .        Extremities: 2 plus symmetric pulses. No edema, clubbing or cyanosis Neurologic: normal ambulation, Motor symmetric without deficits, sensory intact Skin: warm, dry, no rashes   Labs:   All laboratory work was reviewed including any pertinent negatives or positives listed below:  Labs Reviewed  BASIC METABOLIC PANEL - Abnormal; Notable for the following:       Result Value   Chloride 99 (*)    BUN 28 (*)    Creatinine, Ser 13.04 (*)    Calcium 8.5 (*)    GFR calc non Af Amer 4 (*)    GFR calc Af Amer 4 (*)    All other components within normal limits  CBC WITH DIFFERENTIAL/PLATELET - Abnormal; Notable for the following:    RBC 3.79 (*)    Hemoglobin 10.7 (*)    HCT 32.4 (*)    RDW 17.5 (*)    Lymphs Abs 0.9 (*)    All other components within normal limits    Radiology: * "Dg Chest 2 View  Result Date: 11/18/2016 CLINICAL DATA:  Vascular catheter assessment. EXAM: CHEST  2 VIEW COMPARISON:  Radiographs of April 08, 2016. FINDINGS: The heart size and mediastinal contours are within normal limits. No pneumothorax is noted. Left lung is clear. Mild right basilar infiltrate, atelectasis or scarring is noted with associated loculated pleural effusion. Right internal jugular catheter noted on prior exam has been removed. There is now noted left internal jugular dialysis catheter with distal tips in expected position of the SVC. There also appears to be a vascular stent extending from right axillary region through the right subclavian artery, with distal tip in the expected position of cavoatrial junction. The visualized skeletal structures are unremarkable. IMPRESSION: Vascular stent is seen extending from right axillary region  through right subclavian artery, with distal tip in expected position of cavoatrial junction. Left internal jugular dialysis catheter is noted with distal tip in expected position of the SVC. Stable right basilar atelectasis, infiltrate or scarring is noted with mild associated loculated pleural effusion. Electronically Signed   By: Marijo Conception, M.D.   On: 11/18/2016 07:52   Dg C-arm 1-60 Min-no Report  Result Date: 11/08/2016 There is no Radiologist interpretation  for this exam. "  I personally reviewed the radiologic studies  ED Course: Patient's stay here was uneventful and the patient's otherwise hemodynamically stable. We called the nephrology nurses and they were reluctant to come down to try to declot the access site. Patient does not appear to require any immediate need for his dialysis so I touch base with the vascular surgeon on call The patient up on Monday to switch out his catheter. I felt he could go till Tuesday for his outpatient dialysis.  Assessment: Vascular access problem       Plan: * Outpatient Patient was advised to return immediately if condition worsens. Patient was advised to follow up with their primary care physician or other specialized physicians involved in their outpatient care. The patient and/or family member/power of attorney had laboratory results reviewed at the bedside. All questions and concerns were addressed and appropriate discharge instructions were distributed by the nursing staff.            Daymon Larsen, MD 11/18/16 [phone]

## 2016-11-20 ENCOUNTER — Encounter
Admission: RE | Disposition: A | Discharge status: Home / Self Care | Payer: Self-pay | Source: Ambulatory Visit | Attending: Vascular Surgery

## 2016-11-20 ENCOUNTER — Ambulatory Visit
Admission: RE | Admit: 2016-11-20 | Discharge: 2016-11-20 | Disposition: A | Discharge status: Home / Self Care | Payer: Medicare Other | Source: Ambulatory Visit | Attending: Vascular Surgery | Admitting: Vascular Surgery

## 2016-11-20 DIAGNOSIS — Z825 Family history of asthma and other chronic lower respiratory diseases: Secondary | ICD-10-CM | POA: Diagnosis not present

## 2016-11-20 DIAGNOSIS — Z8041 Family history of malignant neoplasm of ovary: Secondary | ICD-10-CM | POA: Insufficient documentation

## 2016-11-20 DIAGNOSIS — I120 Hypertensive chronic kidney disease with stage 5 chronic kidney disease or end stage renal disease: Secondary | ICD-10-CM | POA: Diagnosis not present

## 2016-11-20 DIAGNOSIS — E785 Hyperlipidemia, unspecified: Secondary | ICD-10-CM | POA: Diagnosis not present

## 2016-11-20 DIAGNOSIS — D638 Anemia in other chronic diseases classified elsewhere: Secondary | ICD-10-CM | POA: Diagnosis not present

## 2016-11-20 DIAGNOSIS — G4733 Obstructive sleep apnea (adult) (pediatric): Secondary | ICD-10-CM | POA: Diagnosis not present

## 2016-11-20 DIAGNOSIS — Z940 Kidney transplant status: Secondary | ICD-10-CM | POA: Diagnosis not present

## 2016-11-20 DIAGNOSIS — T8249XA Other complication of vascular dialysis catheter, initial encounter: Secondary | ICD-10-CM | POA: Diagnosis not present

## 2016-11-20 DIAGNOSIS — Z809 Family history of malignant neoplasm, unspecified: Secondary | ICD-10-CM | POA: Diagnosis not present

## 2016-11-20 DIAGNOSIS — N186 End stage renal disease: Secondary | ICD-10-CM | POA: Diagnosis not present

## 2016-11-20 DIAGNOSIS — Y838 Other surgical procedures as the cause of abnormal reaction of the patient, or of later complication, without mention of misadventure at the time of the procedure: Secondary | ICD-10-CM | POA: Insufficient documentation

## 2016-11-20 DIAGNOSIS — Z992 Dependence on renal dialysis: Secondary | ICD-10-CM | POA: Insufficient documentation

## 2016-11-20 DIAGNOSIS — Z8249 Family history of ischemic heart disease and other diseases of the circulatory system: Secondary | ICD-10-CM | POA: Diagnosis not present

## 2016-11-20 HISTORY — PX: EXCHANGE OF A DIALYSIS CATHETER: SHX5818

## 2016-11-20 SURGERY — EXCHANGE OF A DIALYSIS CATHETER

## 2016-11-20 MED ORDER — HEPARIN SODIUM (PORCINE) 10000 UNIT/ML IJ SOLN
INTRAMUSCULAR | Status: AC
  Filled 2016-11-20: qty 1

## 2016-11-20 MED ORDER — CEFAZOLIN IN D5W 1 GM/50ML IV SOLN
INTRAVENOUS | Status: AC
  Filled 2016-11-20: qty 50

## 2016-11-20 MED ORDER — FENTANYL CITRATE (PF) 100 MCG/2ML IJ SOLN
INTRAMUSCULAR | Status: DC | PRN
Start: 2016-11-20 — End: 2016-11-20
  Administered 2016-11-20 (×2): 50 ug via INTRAVENOUS

## 2016-11-20 MED ORDER — MIDAZOLAM HCL 5 MG/5ML IJ SOLN
INTRAMUSCULAR | Status: AC
  Filled 2016-11-20: qty 5

## 2016-11-20 MED ORDER — FENTANYL CITRATE (PF) 100 MCG/2ML IJ SOLN
INTRAMUSCULAR | Status: AC
  Filled 2016-11-20: qty 2

## 2016-11-20 MED ORDER — HEPARIN (PORCINE) IN NACL 2-0.9 UNIT/ML-% IJ SOLN
INTRAMUSCULAR | Status: AC
  Filled 2016-11-20: qty 500

## 2016-11-20 MED ORDER — LIDOCAINE-EPINEPHRINE (PF) 2 %-1:200000 IJ SOLN
INTRAMUSCULAR | Status: AC
Start: 2016-11-20 — End: 2016-11-20
  Filled 2016-11-20: qty 20

## 2016-11-20 MED ORDER — MIDAZOLAM HCL 2 MG/2ML IJ SOLN
INTRAMUSCULAR | Status: DC | PRN
  Administered 2016-11-20 (×2): 2 mg via INTRAVENOUS

## 2016-11-20 MED ORDER — LIDOCAINE-EPINEPHRINE (PF) 2 %-1:200000 IJ SOLN
INTRAMUSCULAR | Status: AC
  Filled 2016-11-20: qty 20

## 2016-11-20 SURGICAL SUPPLY — 7 items
BIOPATCH RED 1 DISK 7.0 (GAUZE/BANDAGES/DRESSINGS) ×2 IMPLANT
BIOPATCH RED 1IN DISK 7.0MM (GAUZE/BANDAGES/DRESSINGS) ×1
CATH PALINDROME-P 23CM W/VT (CATHETERS) ×3 IMPLANT
GUIDEWIRE SUPER STIFF .035X180 (WIRE) ×4 IMPLANT
PACK ANGIOGRAPHY (CUSTOM PROCEDURE TRAY) ×4 IMPLANT
STOPCOCK PLUG RED CAP (CAP) ×6 IMPLANT
TOWEL OR 17X26 4PK STRL BLUE (TOWEL DISPOSABLE) ×4 IMPLANT

## 2016-11-20 NOTE — Op Note (Addendum)
OPERATIVE NOTE    PRE-OPERATIVE DIAGNOSIS: 1. ESRD 2. Non-functional permcath  POST-OPERATIVE DIAGNOSIS: same as above  PROCEDURE: 1. Fluoroscopic guidance for placement of catheter 2. Placement of a 23 cm tip to cuff tunneled hemodialysis catheter via the left internal jugular vein and removal of previous catheter  SURGEON: Leotis Pain, MD  ANESTHESIA:  Local with moderate conscious sedation for 15 minutes using 4 mg of Versed and 100 mcg of Fentanyl  ESTIMATED BLOOD LOSS: 10 cc  FINDING(S): none  SPECIMEN(S):  None  INDICATIONS:   Patient is a 51 y.o.male who presents with non-functional dialysis catheter and ESRD.  The patient needs long term dialysis access for their ESRD, and a Permcath is necessary.  Risks and benefits are discussed and informed consent is obtained.    DESCRIPTION: After obtaining full informed written consent, the patient was brought back to the vascular suite. The patient received moderate conscious sedation during a face-to-face encounter with me present throughout the entire procedure and supervising the RN monitoring the vital signs, pulse oximetry, telemetry, and mental status throughout the entire procedure. The patient's existing catheter, left neck and chest were sterilely prepped and draped in a sterile surgical field was created.  The existing catheter was dissected free from the fibrous sheath securing the cuff with hemostats and blunt dissection.  A wire was placed. The existing catheter was then removed and the wire used to keep venous access. I selected a 23 cm tip to cuff tunneled dialysis catheter.  Using fluoroscopic guidance the catheter tips were parked in the cavoatrial junction. The appropriate distal connectors were placed. It withdrew blood well and flushed easily with heparinized saline and a concentrated heparin solution was then placed. It was secured to the chest wall with 2 Prolene sutures. A 4-0 Monocryl pursestring suture was placed  around the exit site. Sterile dressings were placed. The patient tolerated the procedure well and was taken to the recovery room in stable condition.  COMPLICATIONS: None  CONDITION: Stable  Leotis Pain 11/20/2016 10:40 AM   This note was created with Dragon Medical transcription system. Any errors in dictation are purely unintentional.

## 2016-11-20 NOTE — H&P (Signed)
[facility] Admission History & Physical  MRN : [id number]  Jonathon Conley is a 51 y.o. (1965-10-22) male who presents with chief complaint of No chief complaint on file. Marland Kitchen  History of Present Illness: Patient presents with a clotted PermCath in his left jugular vein that will not function despite administration of TPA at the dialysis center this weekend. He is less than 2 weeks status post right arm HeRO graft placement so this is not yet ready for use.  He does not have any severe uremic symptoms or signs of volume overload currently. He denies fever or chills. His PermCath replaced for a functional dialysis access until his graft is ready to be used.  No current facility-administered medications for this encounter.     Past Medical History:  Diagnosis Date  . Anemia   . Blood transfusion without reported diagnosis   . Chronic kidney disease    Dialysis Tues, Thurs, Sat  . Colitis   . Diverticulosis 04/12/16   also seen: sigmoid and rectal erythema, path:   . Hemorrhoids 03/2016   bleeding at 04/12/16 colonoscopy, treated with  APC laser. cauterization  . Hyperlipidemia   . Hypertension   . OSA (obstructive sleep apnea) 03/27/2013  . Renal insufficiency     Past Surgical History:  Procedure Laterality Date  . COLONOSCOPY N/A 04/12/2016   Procedure: COLONOSCOPY;  Surgeon: Mauri Pole, MD;  Location: [place] ENDOSCOPY;  Service: Endoscopy;  Laterality: N/A;  . fistulagrams Right   . HOT HEMOSTASIS N/A 04/12/2016   Procedure: HOT HEMOSTASIS (ARGON PLASMA COAGULATION/BICAP);  Surgeon: Mauri Pole, MD;  Location: [facility] ENDOSCOPY;  Service: Endoscopy;  Laterality: N/A;  . KIDNEY TRANSPLANT    . PERIPHERAL VASCULAR CATHETERIZATION Bilateral 10/02/2016   Procedure: Upper Extremity Venography;  Surgeon: Algernon Huxley, MD;  Location: [place] [facility];  Service: Cardiovascular;  Laterality: Bilateral;  . PERIPHERAL VASCULAR CATHETERIZATION Right 11/08/2016   Procedure: DIALYSIS/PERMA CATHETER REMOVAL right Jugular;  Surgeon: Algernon Huxley, MD;  Location: [facility] ORS;  Service: Vascular;  Laterality: Right;  . PERIPHERAL VASCULAR CATHETERIZATION Left 11/08/2016   Procedure: DIALYSIS/PERMA CATHETER INSERTION left jugular with u/s guide and flouroscan;  Surgeon: Algernon Huxley, MD;  Location: [facility] ORS;  Service: Vascular;  Laterality: Left;  . PERIPHERAL VASCULAR CATHETERIZATION N/A 11/08/2016   Procedure: IVC FILTER INSERTION;  Surgeon: Algernon Huxley, MD;  Location: [facility] ORS;  Service: Vascular;  Laterality: N/A;  . perm a cath in Rt upper chest Right   . THORACENTESIS    . VASCULAR ACCESS DEVICE INSERTION Right 11/08/2016   Procedure: INSERTION OF HERO VASCULAR ACCESS DEVICE ( GRAFT );  Surgeon: Algernon Huxley, MD;  Location: [facility] ORS;  Service: Vascular;  Laterality: Right;    Social History Social History  Substance Use Topics  . Smoking status: Never Smoker  . Smokeless tobacco: Never Used  . Alcohol use No  Married  Family History Family History  Problem Relation Age of Onset  . Heart disease Mother   . Cancer Mother     ovarian  . Cancer Maternal Grandmother   . Cancer Maternal Grandfather   . Asthma Son   . Asthma Son     No Known Allergies   REVIEW OF SYSTEMS (Negative unless checked)  Constitutional: [] Weight loss  [] Fever  [] Chills Cardiac: [] Chest pain   [] Chest pressure   [] Palpitations   [] Shortness of breath when laying flat   [] Shortness of breath at rest   [] Shortness  of breath with exertion. Vascular:  [] Pain in legs with walking   [] Pain in legs at rest   [] Pain in legs when laying flat   [] Claudication   [] Pain in feet when walking  [] Pain in feet at rest  [] Pain in feet when laying flat   [] History of DVT   [] Phlebitis   [x] Swelling in legs   [] Varicose veins   [] Non-healing ulcers Pulmonary:   [] Uses home oxygen   [] Productive cough   [] Hemoptysis   [] Wheeze  [] COPD   [] Asthma Neurologic:  [] Dizziness  [] Blackouts   [] Seizures    [] History of stroke   [] History of TIA  [] Aphasia   [] Temporary blindness   [] Dysphagia   [] Weakness or numbness in arms   [] Weakness or numbness in legs Musculoskeletal:  [] Arthritis   [] Joint swelling   [] Joint pain   [] Low back pain Hematologic:  [] Easy bruising  [] Easy bleeding   [] Hypercoagulable state   [x] Anemic  [] Hepatitis Gastrointestinal:  [] Blood in stool   [] Vomiting blood  [] Gastroesophageal reflux/heartburn   [] Difficulty swallowing. Genitourinary:  [x] Chronic kidney disease   [] Difficult urination  [] Frequent urination  [] Burning with urination   [] Blood in urine Skin:  [] Rashes   [] Ulcers   [] Wounds Psychological:  [] History of anxiety   []  History of major depression.  Physical Examination  Vitals:   11/20/16 0917  BP: (!) 145/100  Pulse: 78  Resp: 12  Temp: 97.9 F (36.6 C)  TempSrc: Oral  Weight: 80 kg (176 lb 5.9 oz)  Height: 5' 10"  (1.778 m)   Body mass index is 25.31 kg/m. Gen: WD/WN, NAD Head: [place]/AT, No temporalis wasting. Prominent temp pulse not noted. Ear/Nose/Throat: Hearing grossly intact, nares w/o erythema or drainage, oropharynx w/o Erythema/Exudate,  Eyes: Conjunctiva clear, sclera non-icteric Neck: Trachea midline.  No JVD.  Pulmonary:  Good air movement, respirations not labored, no use of accessory muscles.  Cardiac: RRR, normal S1, S2. Vascular: HeRO graft right arm, left IJ permcath in place Vessel Right Left  Radial Palpable Palpable                                   Gastrointestinal: soft, non-tender/non-distended.  Musculoskeletal: M/S 5/5 throughout.  Extremities without ischemic changes.  No deformity or atrophy.  Neurologic: Sensation grossly intact in extremities.  Symmetrical.  Speech is fluent. Motor exam as listed above. Psychiatric: Judgment intact, Mood & affect appropriate for pt's clinical situation. Dermatologic: No rashes or ulcers noted.  No cellulitis or open wounds. Lymph : No Cervical, Axillary, or Inguinal  lymphadenopathy.     CBC Lab Results  Component Value Date   WBC 4.6 11/18/2016   HGB 10.7 (L) 11/18/2016   HCT 32.4 (L) 11/18/2016   MCV 85.5 11/18/2016   PLT 204 11/18/2016    BMET    Component Value Date/Time   NA 137 11/18/2016 0722   K 4.2 11/18/2016 0722   CL 99 (L) 11/18/2016 0722   CO2 27 11/18/2016 0722   GLUCOSE 93 11/18/2016 0722   BUN 28 (H) 11/18/2016 0722   CREATININE 13.04 (H) 11/18/2016 0722   CREATININE 10.76 (HH) 02/14/2015 1612   CALCIUM 8.5 (L) 11/18/2016 0722   GFRNONAA 4 (L) 11/18/2016 0722   GFRAA 4 (L) 11/18/2016 0722   Estimated Creatinine Clearance: 7 mL/min (by C-G formula based on SCr of 13.04 mg/dL (H)).  COAG Lab Results  Component Value Date   INR 1.04 10/23/2016  INR 1.19 11/03/2015   INR 1.19 02/15/2015    Radiology Dg Chest 2 View  Result Date: 11/18/2016 CLINICAL DATA:  Vascular catheter assessment. EXAM: CHEST  2 VIEW COMPARISON:  Radiographs of April 08, 2016. FINDINGS: The heart size and mediastinal contours are within normal limits. No pneumothorax is noted. Left lung is clear. Mild right basilar infiltrate, atelectasis or scarring is noted with associated loculated pleural effusion. Right internal jugular catheter noted on prior exam has been removed. There is now noted left internal jugular dialysis catheter with distal tips in expected position of the SVC. There also appears to be a vascular stent extending from right axillary region through the right subclavian artery, with distal tip in the expected position of cavoatrial junction. The visualized skeletal structures are unremarkable. IMPRESSION: Vascular stent is seen extending from right axillary region through right subclavian artery, with distal tip in expected position of cavoatrial junction. Left internal jugular dialysis catheter is noted with distal tip in expected position of the SVC. Stable right basilar atelectasis, infiltrate or scarring is noted with mild associated  loculated pleural effusion. Electronically Signed   By: Marijo Conception, M.D.   On: 11/18/2016 07:52   Dg C-arm 1-60 Min-no Report  Result Date: 11/08/2016 There is no Radiologist interpretation  for this exam.    Assessment/Plan 1. ESRD. Status post right arm He RO graft placement which is not yet ready to use. 2. Dysfunction of dialysis access device. Left jugular PermCath currently not working and he needs an access for dialysis until his graft is ready to be used. Will perform Permcath exchange today. 3. Hypertension. Stable on outpatient medications. 4. Hyperlipidemia. lipid control important in reducing the progression of atherosclerotic disease. Continue statin therapy    Leotis Pain, MD  11/20/2016 10:08 AM

## 2016-11-21 ENCOUNTER — Encounter: Payer: Self-pay | Admitting: Vascular Surgery

## 2016-11-21 DIAGNOSIS — D509 Iron deficiency anemia, unspecified: Secondary | ICD-10-CM | POA: Diagnosis not present

## 2016-11-21 DIAGNOSIS — D689 Coagulation defect, unspecified: Secondary | ICD-10-CM | POA: Diagnosis not present

## 2016-11-21 DIAGNOSIS — N2581 Secondary hyperparathyroidism of renal origin: Secondary | ICD-10-CM | POA: Diagnosis not present

## 2016-11-21 DIAGNOSIS — N186 End stage renal disease: Secondary | ICD-10-CM | POA: Diagnosis not present

## 2016-11-21 DIAGNOSIS — D631 Anemia in chronic kidney disease: Secondary | ICD-10-CM | POA: Diagnosis not present

## 2016-11-23 ENCOUNTER — Telehealth (INDEPENDENT_AMBULATORY_CARE_PROVIDER_SITE_OTHER): Payer: Self-pay

## 2016-11-23 DIAGNOSIS — N186 End stage renal disease: Secondary | ICD-10-CM | POA: Diagnosis not present

## 2016-11-23 DIAGNOSIS — D631 Anemia in chronic kidney disease: Secondary | ICD-10-CM | POA: Diagnosis not present

## 2016-11-23 DIAGNOSIS — N2581 Secondary hyperparathyroidism of renal origin: Secondary | ICD-10-CM | POA: Diagnosis not present

## 2016-11-23 DIAGNOSIS — D509 Iron deficiency anemia, unspecified: Secondary | ICD-10-CM | POA: Diagnosis not present

## 2016-11-23 DIAGNOSIS — D689 Coagulation defect, unspecified: Secondary | ICD-10-CM | POA: Diagnosis not present

## 2016-11-23 NOTE — Telephone Encounter (Signed)
Patient called stating that his arm hurts during dialysis so he stopped it early today and he was told to call and ask for more pain medication.

## 2016-11-25 DIAGNOSIS — D509 Iron deficiency anemia, unspecified: Secondary | ICD-10-CM | POA: Diagnosis not present

## 2016-11-25 DIAGNOSIS — N2581 Secondary hyperparathyroidism of renal origin: Secondary | ICD-10-CM | POA: Diagnosis not present

## 2016-11-25 DIAGNOSIS — D689 Coagulation defect, unspecified: Secondary | ICD-10-CM | POA: Diagnosis not present

## 2016-11-25 DIAGNOSIS — N186 End stage renal disease: Secondary | ICD-10-CM | POA: Diagnosis not present

## 2016-11-25 DIAGNOSIS — D631 Anemia in chronic kidney disease: Secondary | ICD-10-CM | POA: Diagnosis not present

## 2016-11-28 DIAGNOSIS — D631 Anemia in chronic kidney disease: Secondary | ICD-10-CM | POA: Diagnosis not present

## 2016-11-28 DIAGNOSIS — N186 End stage renal disease: Secondary | ICD-10-CM | POA: Diagnosis not present

## 2016-11-28 DIAGNOSIS — N2581 Secondary hyperparathyroidism of renal origin: Secondary | ICD-10-CM | POA: Diagnosis not present

## 2016-11-28 DIAGNOSIS — D509 Iron deficiency anemia, unspecified: Secondary | ICD-10-CM | POA: Diagnosis not present

## 2016-11-28 DIAGNOSIS — D689 Coagulation defect, unspecified: Secondary | ICD-10-CM | POA: Diagnosis not present

## 2016-11-29 DIAGNOSIS — Z01818 Encounter for other preprocedural examination: Secondary | ICD-10-CM | POA: Diagnosis not present

## 2016-11-29 DIAGNOSIS — N186 End stage renal disease: Secondary | ICD-10-CM | POA: Diagnosis not present

## 2016-11-30 DIAGNOSIS — D509 Iron deficiency anemia, unspecified: Secondary | ICD-10-CM | POA: Diagnosis not present

## 2016-11-30 DIAGNOSIS — N2581 Secondary hyperparathyroidism of renal origin: Secondary | ICD-10-CM | POA: Diagnosis not present

## 2016-11-30 DIAGNOSIS — D631 Anemia in chronic kidney disease: Secondary | ICD-10-CM | POA: Diagnosis not present

## 2016-11-30 DIAGNOSIS — N186 End stage renal disease: Secondary | ICD-10-CM | POA: Diagnosis not present

## 2016-11-30 DIAGNOSIS — D689 Coagulation defect, unspecified: Secondary | ICD-10-CM | POA: Diagnosis not present

## 2016-12-01 NOTE — Telephone Encounter (Signed)
Patient was given a pain prescription and is coming in on 12/04/16 for a U/s and f/u.

## 2016-12-04 ENCOUNTER — Other Ambulatory Visit (INDEPENDENT_AMBULATORY_CARE_PROVIDER_SITE_OTHER): Payer: Self-pay | Admitting: Vascular Surgery

## 2016-12-04 ENCOUNTER — Other Ambulatory Visit (INDEPENDENT_AMBULATORY_CARE_PROVIDER_SITE_OTHER): Payer: Medicare Other

## 2016-12-04 DIAGNOSIS — N186 End stage renal disease: Secondary | ICD-10-CM | POA: Diagnosis not present

## 2016-12-04 DIAGNOSIS — Z48812 Encounter for surgical aftercare following surgery on the circulatory system: Secondary | ICD-10-CM

## 2016-12-05 DIAGNOSIS — D509 Iron deficiency anemia, unspecified: Secondary | ICD-10-CM | POA: Diagnosis not present

## 2016-12-05 DIAGNOSIS — D689 Coagulation defect, unspecified: Secondary | ICD-10-CM | POA: Diagnosis not present

## 2016-12-05 DIAGNOSIS — N2581 Secondary hyperparathyroidism of renal origin: Secondary | ICD-10-CM | POA: Diagnosis not present

## 2016-12-05 DIAGNOSIS — D631 Anemia in chronic kidney disease: Secondary | ICD-10-CM | POA: Diagnosis not present

## 2016-12-05 DIAGNOSIS — N186 End stage renal disease: Secondary | ICD-10-CM | POA: Diagnosis not present

## 2016-12-07 DIAGNOSIS — D631 Anemia in chronic kidney disease: Secondary | ICD-10-CM | POA: Diagnosis not present

## 2016-12-07 DIAGNOSIS — D689 Coagulation defect, unspecified: Secondary | ICD-10-CM | POA: Diagnosis not present

## 2016-12-07 DIAGNOSIS — D509 Iron deficiency anemia, unspecified: Secondary | ICD-10-CM | POA: Diagnosis not present

## 2016-12-07 DIAGNOSIS — N186 End stage renal disease: Secondary | ICD-10-CM | POA: Diagnosis not present

## 2016-12-07 DIAGNOSIS — N2581 Secondary hyperparathyroidism of renal origin: Secondary | ICD-10-CM | POA: Diagnosis not present

## 2016-12-08 ENCOUNTER — Ambulatory Visit (INDEPENDENT_AMBULATORY_CARE_PROVIDER_SITE_OTHER): Payer: Medicare Other | Admitting: Vascular Surgery

## 2016-12-08 ENCOUNTER — Encounter (INDEPENDENT_AMBULATORY_CARE_PROVIDER_SITE_OTHER): Payer: Self-pay | Admitting: Vascular Surgery

## 2016-12-08 VITALS — BP 188/107 | HR 84 | Resp 16

## 2016-12-08 DIAGNOSIS — N186 End stage renal disease: Secondary | ICD-10-CM

## 2016-12-08 DIAGNOSIS — E785 Hyperlipidemia, unspecified: Secondary | ICD-10-CM

## 2016-12-08 DIAGNOSIS — Z992 Dependence on renal dialysis: Secondary | ICD-10-CM

## 2016-12-08 DIAGNOSIS — I10 Essential (primary) hypertension: Secondary | ICD-10-CM

## 2016-12-08 NOTE — Assessment & Plan Note (Signed)
The patient has a large symptomatic hematoma in his right arm after access placement. This will need to be evacuated due to the symptoms. I am not sure if his right hand symptoms or compressive secondary to the hematoma or an actual steal symptom. We will set up his hematoma evacuation with negative pressure dressing postoperatively for sometime next week.

## 2016-12-08 NOTE — Assessment & Plan Note (Signed)
lipid control important in reducing the progression of atherosclerotic disease. Continue statin therapy  

## 2016-12-08 NOTE — Progress Notes (Signed)
Patient ID: Jonathon Conley, male   DOB: [birth year], 51 y.o.   MRN: [id number]  Chief Complaint  Patient presents with  . Follow-up    HPI Jonathon Conley is a 51 y.o. male.  Patient returns in follow-up. His arm remains markedly swollen in the upper arm. He is also having coolness and numbness in his right hand. A duplex performed earlier this week showed a large heterogeneous structure consistent with a hematoma in the right upper arm   Past Medical History:  Diagnosis Date  . Anemia   . Blood transfusion without reported diagnosis   . Chronic kidney disease    Dialysis Tues, Thurs, Sat  . Colitis   . Diverticulosis 04/12/16   also seen: sigmoid and rectal erythema, path:   . Hemorrhoids 03/2016   bleeding at 04/12/16 colonoscopy, treated with  APC laser. cauterization  . Hyperlipidemia   . Hypertension   . OSA (obstructive sleep apnea) 03/27/2013  . Renal insufficiency     Past Surgical History:  Procedure Laterality Date  . COLONOSCOPY N/A 04/12/2016   Procedure: COLONOSCOPY;  Surgeon: Mauri Pole, MD;  Location: [facility];  Service: Endoscopy;  Laterality: N/A;  . EXCHANGE OF A DIALYSIS CATHETER  11/20/2016   Procedure: Exchange Of A Dialysis Catheter;  Surgeon: Algernon Huxley, MD;  Location: [facility];  Service: Cardiovascular;;  . fistulagrams Right   . HOT HEMOSTASIS N/A 04/12/2016   Procedure: HOT HEMOSTASIS (ARGON PLASMA COAGULATION/BICAP);  Surgeon: Mauri Pole, MD;  Location: [facility] ENDOSCOPY;  Service: Endoscopy;  Laterality: N/A;  . KIDNEY TRANSPLANT    . PERIPHERAL VASCULAR CATHETERIZATION Bilateral 10/02/2016   Procedure: Upper Extremity Venography;  Surgeon: Algernon Huxley, MD;  Location: [facility];  Service: Cardiovascular;  Laterality: Bilateral;  . PERIPHERAL VASCULAR CATHETERIZATION Right 11/08/2016   Procedure: DIALYSIS/PERMA CATHETER REMOVAL right Jugular;  Surgeon: Algernon Huxley, MD;  Location: [facility] ORS;  Service: Vascular;  Laterality:  Right;  . PERIPHERAL VASCULAR CATHETERIZATION Left 11/08/2016   Procedure: DIALYSIS/PERMA CATHETER INSERTION left jugular with u/s guide and flouroscan;  Surgeon: Algernon Huxley, MD;  Location: [facility] ORS;  Service: Vascular;  Laterality: Left;  . PERIPHERAL VASCULAR CATHETERIZATION N/A 11/08/2016   Procedure: IVC FILTER INSERTION;  Surgeon: Algernon Huxley, MD;  Location: [facility] ORS;  Service: Vascular;  Laterality: N/A;  . perm a cath in Rt upper chest Right   . THORACENTESIS    . VASCULAR ACCESS DEVICE INSERTION Right 11/08/2016   Procedure: INSERTION OF HERO VASCULAR ACCESS DEVICE ( GRAFT );  Surgeon: Algernon Huxley, MD;  Location: [facility] ORS;  Service: Vascular;  Laterality: Right;      Allergies  Allergen Reactions  . Lisinopril Swelling    Facial swelling    Current Outpatient Prescriptions  Medication Sig Dispense Refill  . amLODipine (NORVASC) 10 MG tablet Take 10 mg by mouth daily.    Marland Kitchen aspirin EC 81 MG tablet Take 81 mg by mouth daily.     . calcitRIOL (ROCALTROL) 0.5 MCG capsule Take 1 capsule (0.5 mcg total) by mouth Every Tuesday,Thursday,and Saturday with dialysis. 15 capsule 0  . calcium acetate (PHOSLO) 667 MG capsule Take 667 mg by mouth 3 (three) times daily with meals.    . carvedilol (COREG) 25 MG tablet Take 25 mg by mouth 2 (two) times daily.   0  . cinacalcet (SENSIPAR) 30 MG tablet Take 30 mg by mouth daily with supper.     Marland Kitchen  ferrous sulfate 325 (65 FE) MG tablet Take 325 mg by mouth 3 (three) times daily with meals. Reported on 04/09/2016    . hydrALAZINE (APRESOLINE) 50 MG tablet Take 25 mg by mouth 3 (three) times daily. Reported on 04/09/2016    . HYDROcodone-acetaminophen (NORCO) 5-325 MG tablet Take 1 tablet by mouth every 6 (six) hours as needed for moderate pain. 30 tablet 0  . Mesalamine 800 MG TBEC Take 1 tablet (800 mg total) by mouth 3 (three) times daily. 90 tablet 6  . simvastatin (ZOCOR) 5 MG tablet Take 1 tablet (5 mg total) by mouth at bedtime. 30 tablet 0   No  current facility-administered medications for this visit.         Physical Exam BP (!) 188/107   Pulse 84   Resp 16  Gen:  WD/WN, NAD Heart: RRR, No M/R/G Lungs: CTAB Skin: incision C/D/I but softball sized hematoma in the upper arm area.     Assessment/Plan:  Hyperlipidemia lipid control important in reducing the progression of atherosclerotic disease. Continue statin therapy   HTN (hypertension) blood pressure control important in reducing the progression of atherosclerotic disease. On appropriate oral medications.   ESRD on dialysis [facility]) The patient has a large symptomatic hematoma in his right arm after access placement. This will need to be evacuated due to the symptoms. I am not sure if his right hand symptoms or compressive secondary to the hematoma or an actual steal symptom. We will set up his hematoma evacuation with negative pressure dressing postoperatively for sometime next week.      Leotis Pain 12/08/2016, 5:23 PM   This note was created with Dragon medical transcription system.  Any errors from dictation are unintentional.

## 2016-12-08 NOTE — Assessment & Plan Note (Signed)
blood pressure control important in reducing the progression of atherosclerotic disease. On appropriate oral medications.  

## 2016-12-09 DIAGNOSIS — D509 Iron deficiency anemia, unspecified: Secondary | ICD-10-CM | POA: Diagnosis not present

## 2016-12-09 DIAGNOSIS — N2581 Secondary hyperparathyroidism of renal origin: Secondary | ICD-10-CM | POA: Diagnosis not present

## 2016-12-09 DIAGNOSIS — N186 End stage renal disease: Secondary | ICD-10-CM | POA: Diagnosis not present

## 2016-12-09 DIAGNOSIS — D689 Coagulation defect, unspecified: Secondary | ICD-10-CM | POA: Diagnosis not present

## 2016-12-09 DIAGNOSIS — D631 Anemia in chronic kidney disease: Secondary | ICD-10-CM | POA: Diagnosis not present

## 2016-12-11 ENCOUNTER — Other Ambulatory Visit (INDEPENDENT_AMBULATORY_CARE_PROVIDER_SITE_OTHER): Payer: Self-pay

## 2016-12-11 DIAGNOSIS — N186 End stage renal disease: Secondary | ICD-10-CM

## 2016-12-11 DIAGNOSIS — Z992 Dependence on renal dialysis: Principal | ICD-10-CM

## 2016-12-12 ENCOUNTER — Other Ambulatory Visit: Payer: Medicare Other

## 2016-12-12 ENCOUNTER — Encounter
Admission: RE | Admit: 2016-12-12 | Discharge: 2016-12-12 | Disposition: A | Discharge status: Home / Self Care | Payer: Medicare Other | Source: Ambulatory Visit | Attending: Vascular Surgery | Admitting: Vascular Surgery

## 2016-12-12 DIAGNOSIS — Z940 Kidney transplant status: Secondary | ICD-10-CM | POA: Diagnosis not present

## 2016-12-12 DIAGNOSIS — Z7982 Long term (current) use of aspirin: Secondary | ICD-10-CM | POA: Diagnosis not present

## 2016-12-12 DIAGNOSIS — M7989 Other specified soft tissue disorders: Secondary | ICD-10-CM | POA: Diagnosis present

## 2016-12-12 DIAGNOSIS — N2581 Secondary hyperparathyroidism of renal origin: Secondary | ICD-10-CM | POA: Diagnosis not present

## 2016-12-12 DIAGNOSIS — Z992 Dependence on renal dialysis: Secondary | ICD-10-CM | POA: Diagnosis not present

## 2016-12-12 DIAGNOSIS — G4733 Obstructive sleep apnea (adult) (pediatric): Secondary | ICD-10-CM | POA: Diagnosis not present

## 2016-12-12 DIAGNOSIS — I132 Hypertensive heart and chronic kidney disease with heart failure and with stage 5 chronic kidney disease, or end stage renal disease: Secondary | ICD-10-CM | POA: Diagnosis not present

## 2016-12-12 DIAGNOSIS — Z79899 Other long term (current) drug therapy: Secondary | ICD-10-CM | POA: Diagnosis not present

## 2016-12-12 DIAGNOSIS — D631 Anemia in chronic kidney disease: Secondary | ICD-10-CM | POA: Diagnosis not present

## 2016-12-12 DIAGNOSIS — E785 Hyperlipidemia, unspecified: Secondary | ICD-10-CM | POA: Diagnosis not present

## 2016-12-12 DIAGNOSIS — D689 Coagulation defect, unspecified: Secondary | ICD-10-CM | POA: Diagnosis not present

## 2016-12-12 DIAGNOSIS — N186 End stage renal disease: Secondary | ICD-10-CM | POA: Diagnosis not present

## 2016-12-12 DIAGNOSIS — I509 Heart failure, unspecified: Secondary | ICD-10-CM | POA: Diagnosis not present

## 2016-12-12 DIAGNOSIS — I97648 Postprocedural seroma of a circulatory system organ or structure following other circulatory system procedure: Secondary | ICD-10-CM | POA: Diagnosis not present

## 2016-12-12 DIAGNOSIS — Y832 Surgical operation with anastomosis, bypass or graft as the cause of abnormal reaction of the patient, or of later complication, without mention of misadventure at the time of the procedure: Secondary | ICD-10-CM | POA: Diagnosis not present

## 2016-12-12 DIAGNOSIS — D649 Anemia, unspecified: Secondary | ICD-10-CM | POA: Diagnosis not present

## 2016-12-12 DIAGNOSIS — D509 Iron deficiency anemia, unspecified: Secondary | ICD-10-CM | POA: Diagnosis not present

## 2016-12-12 HISTORY — DX: Unspecified asthma, uncomplicated: J45.909

## 2016-12-12 LAB — BASIC METABOLIC PANEL
ANION GAP: 9 (ref 5–15)
BUN: 14 mg/dL (ref 6–20)
CALCIUM: 8.2 mg/dL — AB (ref 8.9–10.3)
CHLORIDE: 99 mmol/L — AB (ref 101–111)
CO2: 27 mmol/L (ref 22–32)
Creatinine, Ser: 5.76 mg/dL — ABNORMAL HIGH (ref 0.61–1.24)
GFR calc non Af Amer: 10 mL/min — ABNORMAL LOW (ref >60)
GFR, EST AFRICAN AMERICAN: 12 mL/min — AB (ref >60)
GLUCOSE: 81 mg/dL (ref 65–99)
Potassium: 3.8 mmol/L (ref 3.5–5.1)
Sodium: 135 mmol/L (ref 135–145)

## 2016-12-12 LAB — CBC
HEMATOCRIT: 32 % — AB (ref 40.0–52.0)
HEMOGLOBIN: 10.9 g/dL — AB (ref 13.0–18.0)
MCH: 28.8 pg (ref 26.0–34.0)
MCHC: 34.1 g/dL (ref 32.0–36.0)
MCV: 84.4 fL (ref 80.0–100.0)
Platelets: 184 10*3/uL (ref 150–440)
RBC: 3.79 MIL/uL — ABNORMAL LOW (ref 4.40–5.90)
RDW: 18.3 % — AB (ref 11.5–14.5)
WBC: 4.9 10*3/uL (ref 3.8–10.6)

## 2016-12-12 LAB — APTT: APTT: 44 s — AB (ref 24–36)

## 2016-12-12 LAB — SURGICAL PCR SCREEN
MRSA, PCR: NEGATIVE
STAPHYLOCOCCUS AUREUS: NEGATIVE

## 2016-12-12 NOTE — Patient Instructions (Signed)
Your procedure is scheduled on: Wednesday 12/13/16 Report to [place]. 2ND FLOOR MEDICAL MALL ENTRANCE. To find out your arrival time please call [phone] between 1PM - 3PM on Today.  Remember: Instructions that are not followed completely may result in serious medical risk, up to and including death, or upon the discretion of your surgeon and anesthesiologist your surgery may need to be rescheduled.    __X__ 1. Do not eat food or drink liquids after midnight. No gum chewing or hard candies.     __X__ 2. No Alcohol for 24 hours before or after surgery.   ____ 3. Bring all medications with you on the day of surgery if instructed.    __X__ 4. Notify your doctor if there is any change in your medical condition     (cold, fever, infections).             ___X__5. No smoking within 24 hours of your surgery.     Do not wear jewelry, make-up, hairpins, clips or nail polish.  Do not wear lotions, powders, or perfumes.   Do not shave 48 hours prior to surgery. Men may shave face and neck.  Do not bring valuables to the hospital.    [facility] is not responsible for any belongings or valuables.               Contacts, dentures or bridgework may not be worn into surgery.  Leave your suitcase in the car. After surgery it may be brought to your room.  For patients admitted to the hospital, discharge time is determined by your                treatment team.   Patients discharged the day of surgery will not be allowed to drive home.   Please read over the following fact sheets that you were given:   MRSA Information   __X__ Take these medicines the morning of surgery with A SIP OF WATER:    1. AMLODIPINE  2. CARVEDILOL  3. HYDRALAZINE  4.  5.  6.  ____ Fleet Enema (as directed)   __X__ Use CHG Soap as directed  ____ Use inhalers on the day of surgery  ____ Stop metformin 2 days prior to surgery    ____ Take 1/2 of usual insulin dose the night before surgery and none on the  morning of surgery.   ____ Stop Coumadin/Plavix/aspirin on   __X__ Stop Anti-inflammatories such as Advil, Aleve, Ibuprofen, Motrin, Naproxen, Naprosyn, Goodies,powder, or aspirin products.  OK to take Tylenol.   ____ Stop supplements until after surgery.    ____ Bring C-Pap to the hospital.

## 2016-12-13 ENCOUNTER — Encounter
Admission: RE | Disposition: A | Discharge status: Home / Self Care | Payer: Self-pay | Source: Ambulatory Visit | Attending: Vascular Surgery

## 2016-12-13 ENCOUNTER — Ambulatory Visit: Payer: Medicare Other | Admitting: Registered Nurse

## 2016-12-13 ENCOUNTER — Encounter: Payer: Self-pay | Admitting: *Deleted

## 2016-12-13 ENCOUNTER — Ambulatory Visit
Admission: RE | Admit: 2016-12-13 | Discharge: 2016-12-13 | Disposition: A | Discharge status: Home / Self Care | Payer: Medicare Other | Source: Ambulatory Visit | Attending: Vascular Surgery | Admitting: Vascular Surgery

## 2016-12-13 DIAGNOSIS — Z7982 Long term (current) use of aspirin: Secondary | ICD-10-CM | POA: Insufficient documentation

## 2016-12-13 DIAGNOSIS — I97648 Postprocedural seroma of a circulatory system organ or structure following other circulatory system procedure: Secondary | ICD-10-CM | POA: Diagnosis not present

## 2016-12-13 DIAGNOSIS — T8610 Unspecified complication of kidney transplant: Secondary | ICD-10-CM | POA: Diagnosis not present

## 2016-12-13 DIAGNOSIS — I110 Hypertensive heart disease with heart failure: Secondary | ICD-10-CM | POA: Diagnosis not present

## 2016-12-13 DIAGNOSIS — E785 Hyperlipidemia, unspecified: Secondary | ICD-10-CM | POA: Insufficient documentation

## 2016-12-13 DIAGNOSIS — Z992 Dependence on renal dialysis: Secondary | ICD-10-CM | POA: Insufficient documentation

## 2016-12-13 DIAGNOSIS — N186 End stage renal disease: Secondary | ICD-10-CM | POA: Insufficient documentation

## 2016-12-13 DIAGNOSIS — S40021A Contusion of right upper arm, initial encounter: Secondary | ICD-10-CM | POA: Diagnosis not present

## 2016-12-13 DIAGNOSIS — G4733 Obstructive sleep apnea (adult) (pediatric): Secondary | ICD-10-CM | POA: Insufficient documentation

## 2016-12-13 DIAGNOSIS — I132 Hypertensive heart and chronic kidney disease with heart failure and with stage 5 chronic kidney disease, or end stage renal disease: Secondary | ICD-10-CM | POA: Diagnosis not present

## 2016-12-13 DIAGNOSIS — D649 Anemia, unspecified: Secondary | ICD-10-CM | POA: Insufficient documentation

## 2016-12-13 DIAGNOSIS — I509 Heart failure, unspecified: Secondary | ICD-10-CM | POA: Insufficient documentation

## 2016-12-13 DIAGNOSIS — Z940 Kidney transplant status: Secondary | ICD-10-CM | POA: Insufficient documentation

## 2016-12-13 DIAGNOSIS — L7634 Postprocedural seroma of skin and subcutaneous tissue following other procedure: Secondary | ICD-10-CM | POA: Diagnosis not present

## 2016-12-13 DIAGNOSIS — Y832 Surgical operation with anastomosis, bypass or graft as the cause of abnormal reaction of the patient, or of later complication, without mention of misadventure at the time of the procedure: Secondary | ICD-10-CM | POA: Insufficient documentation

## 2016-12-13 DIAGNOSIS — Z79899 Other long term (current) drug therapy: Secondary | ICD-10-CM | POA: Insufficient documentation

## 2016-12-13 DIAGNOSIS — Z920 Personal history of contraception: Secondary | ICD-10-CM | POA: Diagnosis not present

## 2016-12-13 HISTORY — PX: APPLICATION OF WOUND VAC: SHX5189

## 2016-12-13 HISTORY — PX: HEMATOMA EVACUATION: SHX5118

## 2016-12-13 LAB — POTASSIUM: Potassium: 4.6 mmol/L (ref 3.5–5.1)

## 2016-12-13 LAB — POCT I-STAT 4, (NA,K, GLUC, HGB,HCT)
Glucose, Bld: 87 mg/dL (ref 65–99)
HCT: 33 % — ABNORMAL LOW (ref 39.0–52.0)
HEMOGLOBIN: 11.2 g/dL — AB (ref 13.0–17.0)
Potassium: 4.6 mmol/L (ref 3.5–5.1)
Sodium: 138 mmol/L (ref 135–145)

## 2016-12-13 SURGERY — EVACUATION HEMATOMA
Anesthesia: General | Site: Arm Upper | Laterality: Right | Wound class: Clean

## 2016-12-13 MED ORDER — VASOPRESSIN 20 UNIT/ML IV SOLN
INTRAVENOUS | Status: DC | PRN
  Administered 2016-12-13: 1 [IU] via INTRAVENOUS

## 2016-12-13 MED ORDER — MIDAZOLAM HCL 2 MG/2ML IJ SOLN
INTRAMUSCULAR | Status: AC
  Filled 2016-12-13: qty 2

## 2016-12-13 MED ORDER — SODIUM CHLORIDE 0.9 % IV SOLN
INTRAVENOUS | Status: DC
  Administered 2016-12-13: 13:00:00 via INTRAVENOUS

## 2016-12-13 MED ORDER — LIDOCAINE HCL (PF) 2 % IJ SOLN
INTRAMUSCULAR | Status: AC
  Filled 2016-12-13: qty 2

## 2016-12-13 MED ORDER — PROPOFOL 10 MG/ML IV BOLUS
INTRAVENOUS | Status: AC
  Filled 2016-12-13: qty 20

## 2016-12-13 MED ORDER — LIDOCAINE HCL (CARDIAC) 20 MG/ML IV SOLN
INTRAVENOUS | Status: DC | PRN
  Administered 2016-12-13: 80 mg via INTRAVENOUS

## 2016-12-13 MED ORDER — LIDOCAINE HCL (PF) 1 % IJ SOLN
INTRAMUSCULAR | Status: AC
  Filled 2016-12-13: qty 30

## 2016-12-13 MED ORDER — VASOPRESSIN 20 UNIT/ML IV SOLN
INTRAVENOUS | Status: AC
  Filled 2016-12-13: qty 1

## 2016-12-13 MED ORDER — CEFAZOLIN IN D5W 1 GM/50ML IV SOLN: 1.0000 g | Freq: Once | INTRAVENOUS | Status: DC

## 2016-12-13 MED ORDER — FAMOTIDINE 20 MG PO TABS
20.0000 mg | ORAL_TABLET | Freq: Once | ORAL | Status: AC
  Administered 2016-12-13: 20 mg via ORAL

## 2016-12-13 MED ORDER — PHENYLEPHRINE HCL 10 MG/ML IJ SOLN
INTRAMUSCULAR | Status: DC | PRN
  Administered 2016-12-13: 100 ug via INTRAVENOUS
  Administered 2016-12-13 (×2): 50 ug via INTRAVENOUS

## 2016-12-13 MED ORDER — CEFAZOLIN IN D5W 1 GM/50ML IV SOLN
INTRAVENOUS | Status: AC
  Administered 2016-12-13: 1 g
  Filled 2016-12-13: qty 50

## 2016-12-13 MED ORDER — FENTANYL CITRATE (PF) 100 MCG/2ML IJ SOLN
INTRAMUSCULAR | Status: DC | PRN
  Administered 2016-12-13: 50 ug via INTRAVENOUS

## 2016-12-13 MED ORDER — FENTANYL CITRATE (PF) 100 MCG/2ML IJ SOLN: 25.0000 ug | INTRAMUSCULAR | Status: DC | PRN

## 2016-12-13 MED ORDER — FENTANYL CITRATE (PF) 100 MCG/2ML IJ SOLN
INTRAMUSCULAR | Status: AC
  Filled 2016-12-13: qty 2

## 2016-12-13 MED ORDER — FAMOTIDINE 20 MG PO TABS
ORAL_TABLET | ORAL | Status: AC
  Administered 2016-12-13: 20 mg via ORAL
  Filled 2016-12-13: qty 1

## 2016-12-13 MED ORDER — MIDAZOLAM HCL 2 MG/2ML IJ SOLN
INTRAMUSCULAR | Status: DC | PRN
  Administered 2016-12-13 (×2): 1 mg via INTRAVENOUS

## 2016-12-13 MED ORDER — BUPIVACAINE HCL (PF) 0.5 % IJ SOLN
INTRAMUSCULAR | Status: AC
  Filled 2016-12-13: qty 30

## 2016-12-13 MED ORDER — OXYCODONE-ACETAMINOPHEN 7.5-325 MG PO TABS: 1.0000 | ORAL_TABLET | ORAL | 0 refills | Status: DC | PRN

## 2016-12-13 MED ORDER — PROMETHAZINE HCL 25 MG/ML IJ SOLN: 6.2500 mg | INTRAMUSCULAR | Status: DC | PRN

## 2016-12-13 MED ORDER — PROPOFOL 10 MG/ML IV BOLUS
INTRAVENOUS | Status: DC | PRN
  Administered 2016-12-13: 200 mg via INTRAVENOUS

## 2016-12-13 SURGICAL SUPPLY — 38 items
BRUSH SCRUB 4% CHG (MISCELLANEOUS) ×4 IMPLANT
CANISTER SUCT 1200ML W/VALVE (MISCELLANEOUS) ×4 IMPLANT
CHLORAPREP W/TINT 26ML (MISCELLANEOUS) ×4 IMPLANT
DRAPE INCISE IOBAN 66X45 STRL (DRAPES) ×4 IMPLANT
DRAPE LAPAROTOMY T 102X78X121 (DRAPES) ×1 IMPLANT
DRSG VAC ATS MED SENSATRAC (GAUZE/BANDAGES/DRESSINGS) IMPLANT
ELECT CAUTERY BLADE 6.4 (BLADE) ×4 IMPLANT
ELECT REM PT RETURN 9FT ADLT (ELECTROSURGICAL) ×4
ELECTRODE REM PT RTRN 9FT ADLT (ELECTROSURGICAL) ×2 IMPLANT
GLOVE BIO SURGEON STRL SZ7 (GLOVE) ×13 IMPLANT
GOWN STRL REUS W/ TWL LRG LVL3 (GOWN DISPOSABLE) ×2 IMPLANT
GOWN STRL REUS W/ TWL XL LVL3 (GOWN DISPOSABLE) ×2 IMPLANT
GOWN STRL REUS W/TWL LRG LVL3 (GOWN DISPOSABLE) ×4
GOWN STRL REUS W/TWL XL LVL3 (GOWN DISPOSABLE) ×4
HANDPIECE INTERPULSE COAX TIP (DISPOSABLE)
IV NS 1000ML (IV SOLUTION)
IV NS 1000ML BAXH (IV SOLUTION) ×1 IMPLANT
KIT RM TURNOVER STRD PROC AR (KITS) ×4 IMPLANT
LABEL OR SOLS (LABEL) ×4 IMPLANT
NS IRRIG 1000ML POUR BTL (IV SOLUTION) ×4 IMPLANT
NS IRRIG 500ML POUR BTL (IV SOLUTION) ×4 IMPLANT
PACK BASIN MINOR ARMC (MISCELLANEOUS) ×2 IMPLANT
PACK EXTREMITY ARMC (MISCELLANEOUS) ×6 IMPLANT
PAD PREP 24X41 OB/GYN DISP (PERSONAL CARE ITEMS) ×2 IMPLANT
SET HNDPC FAN SPRY TIP SCT (DISPOSABLE) ×1 IMPLANT
SOL PREP PVP 2OZ (MISCELLANEOUS)
SOLUTION PREP PVP 2OZ (MISCELLANEOUS) ×1 IMPLANT
SPONGE LAP 18X18 5 PK (GAUZE/BANDAGES/DRESSINGS) ×4 IMPLANT
STOCKINETTE 48X4 2 PLY STRL (GAUZE/BANDAGES/DRESSINGS) IMPLANT
STOCKINETTE STRL 4IN 9604848 (GAUZE/BANDAGES/DRESSINGS) ×4 IMPLANT
SUT ETHILON 4-0 (SUTURE)
SUT ETHILON 4-0 FS2 18XMFL BLK (SUTURE)
SUT VIC AB 3-0 SH 27 (SUTURE) ×4
SUT VIC AB 3-0 SH 27X BRD (SUTURE) ×3 IMPLANT
SUTURE ETHLN 4-0 FS2 18XMF BLK (SUTURE) ×2 IMPLANT
SWAB CULTURE AMIES ANAERIB BLU (MISCELLANEOUS) ×4 IMPLANT
SYR BULB EAR ULCER 3OZ GRN STR (SYRINGE) ×2 IMPLANT
WND VAC CANISTER 500ML (MISCELLANEOUS) ×1 IMPLANT

## 2016-12-13 NOTE — Discharge Instructions (Signed)
Negative Pressure Wound Therapy What is negative pressure wound therapy? Negative pressure wound therapy (NPWT) is a device that helps your wounds heal. NPWT helps your wound stay clean and healthy while it heals from the inside. NPWT uses a bandage (dressing) that is made of a sponge or gauze-like material. This dressing is placed on or inside the wound. The wound is then covered and sealed with a cover dressing that sticks to your skin (adhesive). This keeps air out. A tube connects the cover dressing to a small pump. The pump sucks fluid and germs from the wound. The pump also controls any odor coming from the wound. What are the benefits of NPWT? The benefits of NPWT may include:  Faster healing.  Lower risk of infection.  Decrease in swelling and how much fluid is in the wound.  Fewer dressing changes.  Ability to treat your wound at home.  Shorter hospital stay.  Less pain. What are the risks of NPWT? NPWT is usually safe to use. The most common problem is skin irritation from the dressing adhesive, but there are many ways to help prevent this from happening. However, more serious problems can develop, such as:  Bleeding.  Infection.  Dehydration.  Pain. What do I need to do to care for my wound?  Do not take off the dressing yourself unless told to do so by your health care provider.  Keep all follow-up visits as told by your health care provider. This is important.  Make sure you know how to change your dressing, if you will be doing this at home.  Keep the area clean and dry.  Ask your health care provider for the best way to protect your skin from becoming irritated by the adhesive. What do I need to know about the pump?  Do not turn off the pump yourself unless told to do so by your health care provider, such as for bathing.  Do not turn off the pump for more than two hours. If the pump is off for more than two hours, the dressing will need to be changed.  If  your health care provider says it is okay to shower:  Do not take the pump into the shower.  Make sure the wound dressing is protected and sealed. The wound area must stay dry.  Check frequently that the machine is on, that the machine indicates the therapy is on, and that all clamps are open.  If the alarm sounds:  Stay calm.  Do not turn off the pump or do anything with the dressing.  Call your health care provider right away if you cannot fix the problem. The alarm may go off because the battery is low, the dressing has a leak, or the fluid collection container is full.  Explain to your health care provider what is happening. Follow his or her instructions. When should I seek medical care? Seek medical care if:  You have new pain.  You develop irritation, a rash, or itching around the wound or dressing.  You see new black or yellow tissue in your wound.  The dressing changes are painful or cause bleeding.    The pump alarm goes off and you do not know what to do. When should I seek immediate medical care? Seek immediate medical care if:  You have a lot of bleeding.  You see a sudden change in the color or texture of the drainage.  The wound breaks open.  You have severe pain.  You have signs of infection, such as:  More redness, swelling, or pain.  More fluid or blood.  Warmth.  Pus or a bad smell.  Red streaks leading from wound.  A fever. This information is not intended to replace advice given to you by your health care provider. Make sure you discuss any questions you have with your health care provider. Document Released: 09/14/2008 Document Revised: 08/29/2016 Document Reviewed: 07/08/2015 Elsevier Interactive Patient Education  2017 [employer]   1) The drugs that you were given will stay in your system until tomorrow so for the next 24 hours you should not:  A) Drive an  automobile B) Make any legal decisions C) Drink any alcoholic beverage   2) You may resume regular meals tomorrow.  Today it is better to start with liquids and gradually work up to solid foods.  You may eat anything you prefer, but it is better to start with liquids, then soup and crackers, and gradually work up to solid foods.   3) Please notify your doctor immediately if you have any unusual bleeding, trouble breathing, redness and pain at the surgery site, drainage, fever, or pain not relieved by medication.    4) Additional Instructions: TAKE A STOOL SOFTENER TWICE A DAY WHILE TAKING NARCOTIC PAIN MEDICINE TO PREVENT CONSTIPATION   Please contact your physician with any problems or Same Day Surgery at [phone], Monday through Friday 6 am to 4 pm, or [place] at [facility] number at [phone].

## 2016-12-13 NOTE — H&P (Signed)
[facility] History & Physical Update  The patient was interviewed and re-examined.  The patient's previous History and Physical has been reviewed and is unchanged.  There is no change in the plan of care. We plan to proceed with the scheduled procedure.  Leotis Pain, MD  12/13/2016, 12:11 PM

## 2016-12-13 NOTE — Anesthesia Post-op Follow-up Note (Cosign Needed)
Anesthesia QCDR form completed.        

## 2016-12-13 NOTE — OR Nursing (Signed)
Patient is a very difficult IV stick. It took three attempts to finally get his potassium drawn and then two attempt to get IV. IV was obtained in the left antec but was only able to float it half way in. IV runs well however. Dr, Judie Grieve notified of this.

## 2016-12-13 NOTE — Anesthesia Postprocedure Evaluation (Signed)
Anesthesia Post Note  Patient: Jonathon Conley  Procedure(s) Performed: Procedure(s) (LRB): EVACUATION HEMATOMA (Right) APPLICATION OF WOUND VAC (Right)  Patient location during evaluation: PACU Anesthesia Type: General Level of consciousness: awake and alert Pain management: pain level controlled Vital Signs Assessment: post-procedure vital signs reviewed and stable Respiratory status: spontaneous breathing, nonlabored ventilation, respiratory function stable and patient connected to nasal cannula oxygen Cardiovascular status: blood pressure returned to baseline and stable Postop Assessment: no signs of nausea or vomiting Anesthetic complications: no     Last Vitals:  Vitals:   12/13/16 1445 12/13/16 1500  BP: (!) 171/106 (!) 158/90  Pulse: 73 70  Resp: (!) 8 13  Temp:  36.1 C    Last Pain:  Vitals:   12/13/16 1500  TempSrc:   PainSc: 0-No pain                 Precious Haws Aleesia Henney

## 2016-12-13 NOTE — Transfer of Care (Signed)
Immediate Anesthesia Transfer of Care Note  Patient: Jonathon Conley  Procedure(s) Performed: Procedure(s): EVACUATION HEMATOMA (Right) APPLICATION OF WOUND VAC (Right)  Patient Location: PACU  Anesthesia Type:General  Level of Consciousness: sedated  Airway & Oxygen Therapy: Patient Spontanous Breathing and Patient connected to face mask oxygen  Post-op Assessment: Report given to RN and Post -op Vital signs reviewed and stable  Post vital signs: Reviewed and stable  Last Vitals:  Vitals:   12/13/16 1148 12/13/16 1417  BP: (!) 139/93 (!) 165/94  Pulse: 79 69  Resp: 16 13  Temp: 36.8 C (!) 35.9 C    Last Pain:  Vitals:   12/13/16 1148  TempSrc: Oral  PainSc: 8          Complications: No apparent anesthesia complications

## 2016-12-13 NOTE — Anesthesia Preprocedure Evaluation (Addendum)
Anesthesia Evaluation  Patient identified by MRN, date of birth, ID band Patient awake    Reviewed: Allergy & Precautions, NPO status , Patient's Chart, lab work & pertinent test results  Airway Mallampati: II  TM Distance: <3 FB Neck ROM: limited    Dental  (+) Poor Dentition, Chipped   Pulmonary shortness of breath and with exertion, asthma , sleep apnea ,           Cardiovascular Exercise Tolerance: Good hypertension, Pt. on medications and Pt. on home beta blockers +CHF       Neuro/Psych negative neurological ROS     GI/Hepatic negative GI ROS, Neg liver ROS,   Endo/Other    Renal/GU ESRF and DialysisRenal disease     Musculoskeletal   Abdominal Normal abdominal exam  (+)   Peds  Hematology  (+) anemia ,   Anesthesia Other Findings Past Medical History: No date: Anemia No date: Asthma     Comment: childhood No date: Blood transfusion without reported diagnosis No date: Chronic kidney disease     Comment: Dialysis Tues, Thurs, Sat No date: Colitis 04/12/16: Diverticulosis     Comment: also seen: sigmoid and rectal erythema, path:  03/2016: Hemorrhoids     Comment: bleeding at 04/12/16 colonoscopy, treated with               APC laser. cauterization No date: Hyperlipidemia No date: Hypertension 03/27/2013: OSA (obstructive sleep apnea) No date: Renal insufficiency   Reproductive/Obstetrics                            Anesthesia Physical  Anesthesia Plan  ASA: IV  Anesthesia Plan: General LMA   Post-op Pain Management:    Induction: Intravenous  Airway Management Planned:   Additional Equipment:   Intra-op Plan:   Post-operative Plan: Extubation in OR  Informed Consent: I have reviewed the patients History and Physical, chart, labs and discussed the procedure including the risks, benefits and alternatives for the proposed anesthesia with the patient or authorized  representative who has indicated his/her understanding and acceptance.     Plan Discussed with: CRNA and Surgeon  Anesthesia Plan Comments:        Anesthesia Quick Evaluation

## 2016-12-13 NOTE — Anesthesia Procedure Notes (Signed)
Procedure Name: LMA Insertion Date/Time: 12/13/2016 1:26 PM Performed by: Hedda Slade Pre-anesthesia Checklist: Patient identified, Patient being monitored, Timeout performed, Emergency Drugs available and Suction available Patient Re-evaluated:Patient Re-evaluated prior to inductionOxygen Delivery Method: Circle system utilized Preoxygenation: Pre-oxygenation with 100% oxygen Intubation Type: IV induction Ventilation: Mask ventilation without difficulty LMA: LMA inserted LMA Size: 4.5 Tube type: Oral Number of attempts: 1 Placement Confirmation: positive ETCO2 and breath sounds checked- equal and bilateral Tube secured with: Tape Dental Injury: Teeth and Oropharynx as per pre-operative assessment

## 2016-12-13 NOTE — Op Note (Signed)
    OPERATIVE NOTE   PROCEDURE: 1. Irrigation and drainage of right arm seroma with negative pressure dressing placement  PRE-OPERATIVE DIAGNOSIS: Hematoma/seroma of right arm after previous dialysis graft placement    POST-OPERATIVE DIAGNOSIS: Same as above  SURGEON: Leotis Pain, MD  ASSISTANT(S): None  ANESTHESIA: general  ESTIMATED BLOOD LOSS: 5 cc  FINDING(S): Clear serous fluid evacuated  SPECIMEN(S):  Swab for culture  INDICATIONS:   Jonathon Conley is a 51 y.o. male who presents with prominent swelling and an ultrasound showing complex fluid in his right upper arm after previous dialysis graft placement. This is quite large and symptomatic. He is brought in for evacuation of this collection and negative pressure dressing. Risks and benefits are discussed.  DESCRIPTION: After obtaining full informed written consent, the patient was brought back to the operating room and placed supine upon the operating table.  The patient received IV antibiotics prior to induction.  After obtaining adequate anesthesia, the patient was prepped and draped in the standard fashion. A small incision was made a couple of centimeters below the previous incision where the arm could be entered away from the graft to avoid contamination of the PTFE graft. There was clearly a fluid-filled sac consistent with a seroma that was identified. This was entered sharply and a large amount of clear straw-colored fluid was evacuated. Some where between 125 and 100 cc of fluid were evacuated although this was difficult to quantify due to the amount of fluid lost onto the field. There was no purulent material. There was no blood. A culture swab was sent. I then probed the wound and there was no active bleeding. The graft continued to have flow. I then closed the subcutaneous tissues with 3 interrupted 3-0 Vicryl sutures. A- pressure dressing was then placed overlying the wound to continue to evacuate the fluid. The patient  was then awakened from anesthesia and taken to the recovery room in stable condition having tolerated the procedure well.  COMPLICATIONS: none  CONDITION: stable  Leotis Pain  12/13/2016, 2:08 PM   This note was created with Dragon Medical transcription system. Any errors in dictation are purely unintentional.

## 2016-12-14 ENCOUNTER — Encounter: Payer: Self-pay | Admitting: Vascular Surgery

## 2016-12-14 DIAGNOSIS — D509 Iron deficiency anemia, unspecified: Secondary | ICD-10-CM | POA: Diagnosis not present

## 2016-12-14 DIAGNOSIS — N186 End stage renal disease: Secondary | ICD-10-CM | POA: Diagnosis not present

## 2016-12-14 DIAGNOSIS — N2581 Secondary hyperparathyroidism of renal origin: Secondary | ICD-10-CM | POA: Diagnosis not present

## 2016-12-14 DIAGNOSIS — D631 Anemia in chronic kidney disease: Secondary | ICD-10-CM | POA: Diagnosis not present

## 2016-12-15 DIAGNOSIS — T82897A Other specified complication of cardiac prosthetic devices, implants and grafts, initial encounter: Secondary | ICD-10-CM | POA: Diagnosis not present

## 2016-12-15 DIAGNOSIS — D631 Anemia in chronic kidney disease: Secondary | ICD-10-CM | POA: Diagnosis not present

## 2016-12-15 DIAGNOSIS — Z992 Dependence on renal dialysis: Secondary | ICD-10-CM | POA: Diagnosis not present

## 2016-12-15 DIAGNOSIS — K5790 Diverticulosis of intestine, part unspecified, without perforation or abscess without bleeding: Secondary | ICD-10-CM | POA: Diagnosis not present

## 2016-12-15 DIAGNOSIS — S40021A Contusion of right upper arm, initial encounter: Secondary | ICD-10-CM | POA: Diagnosis not present

## 2016-12-15 DIAGNOSIS — G4733 Obstructive sleep apnea (adult) (pediatric): Secondary | ICD-10-CM | POA: Diagnosis not present

## 2016-12-15 DIAGNOSIS — I120 Hypertensive chronic kidney disease with stage 5 chronic kidney disease or end stage renal disease: Secondary | ICD-10-CM | POA: Diagnosis not present

## 2016-12-15 DIAGNOSIS — E785 Hyperlipidemia, unspecified: Secondary | ICD-10-CM | POA: Diagnosis not present

## 2016-12-15 DIAGNOSIS — N186 End stage renal disease: Secondary | ICD-10-CM | POA: Diagnosis not present

## 2016-12-17 ENCOUNTER — Encounter: Payer: Self-pay | Admitting: Emergency Medicine

## 2016-12-17 ENCOUNTER — Emergency Department: Payer: Medicare Other

## 2016-12-17 ENCOUNTER — Inpatient Hospital Stay
Admission: EM | Admit: 2016-12-17 | Discharge: 2016-12-19 | DRG: 907 | Disposition: A | Discharge status: Home / Self Care | Payer: Medicare Other | Attending: Internal Medicine | Admitting: Internal Medicine

## 2016-12-17 DIAGNOSIS — Z992 Dependence on renal dialysis: Secondary | ICD-10-CM | POA: Diagnosis not present

## 2016-12-17 DIAGNOSIS — Z888 Allergy status to other drugs, medicaments and biological substances status: Secondary | ICD-10-CM

## 2016-12-17 DIAGNOSIS — J90 Pleural effusion, not elsewhere classified: Secondary | ICD-10-CM | POA: Diagnosis not present

## 2016-12-17 DIAGNOSIS — M96843 Postprocedural seroma of a musculoskeletal structure following other procedure: Principal | ICD-10-CM | POA: Diagnosis present

## 2016-12-17 DIAGNOSIS — E871 Hypo-osmolality and hyponatremia: Secondary | ICD-10-CM | POA: Diagnosis present

## 2016-12-17 DIAGNOSIS — D649 Anemia, unspecified: Secondary | ICD-10-CM | POA: Diagnosis not present

## 2016-12-17 DIAGNOSIS — N186 End stage renal disease: Secondary | ICD-10-CM | POA: Diagnosis present

## 2016-12-17 DIAGNOSIS — L02413 Cutaneous abscess of right upper limb: Secondary | ICD-10-CM | POA: Diagnosis present

## 2016-12-17 DIAGNOSIS — Z79899 Other long term (current) drug therapy: Secondary | ICD-10-CM

## 2016-12-17 DIAGNOSIS — T8140XA Infection following a procedure, unspecified, initial encounter: Secondary | ICD-10-CM

## 2016-12-17 DIAGNOSIS — I509 Heart failure, unspecified: Secondary | ICD-10-CM | POA: Diagnosis not present

## 2016-12-17 DIAGNOSIS — E785 Hyperlipidemia, unspecified: Secondary | ICD-10-CM | POA: Diagnosis present

## 2016-12-17 DIAGNOSIS — S40021A Contusion of right upper arm, initial encounter: Secondary | ICD-10-CM | POA: Diagnosis not present

## 2016-12-17 DIAGNOSIS — M7989 Other specified soft tissue disorders: Secondary | ICD-10-CM | POA: Diagnosis not present

## 2016-12-17 DIAGNOSIS — Y838 Other surgical procedures as the cause of abnormal reaction of the patient, or of later complication, without mention of misadventure at the time of the procedure: Secondary | ICD-10-CM | POA: Diagnosis present

## 2016-12-17 DIAGNOSIS — Z8249 Family history of ischemic heart disease and other diseases of the circulatory system: Secondary | ICD-10-CM | POA: Diagnosis not present

## 2016-12-17 DIAGNOSIS — R2231 Localized swelling, mass and lump, right upper limb: Secondary | ICD-10-CM | POA: Diagnosis not present

## 2016-12-17 DIAGNOSIS — R6510 Systemic inflammatory response syndrome (SIRS) of non-infectious origin without acute organ dysfunction: Secondary | ICD-10-CM

## 2016-12-17 DIAGNOSIS — I10 Essential (primary) hypertension: Secondary | ICD-10-CM | POA: Diagnosis not present

## 2016-12-17 DIAGNOSIS — G4733 Obstructive sleep apnea (adult) (pediatric): Secondary | ICD-10-CM | POA: Diagnosis present

## 2016-12-17 DIAGNOSIS — N2581 Secondary hyperparathyroidism of renal origin: Secondary | ICD-10-CM | POA: Diagnosis present

## 2016-12-17 DIAGNOSIS — I120 Hypertensive chronic kidney disease with stage 5 chronic kidney disease or end stage renal disease: Secondary | ICD-10-CM | POA: Diagnosis present

## 2016-12-17 DIAGNOSIS — I132 Hypertensive heart and chronic kidney disease with heart failure and with stage 5 chronic kidney disease, or end stage renal disease: Secondary | ICD-10-CM | POA: Diagnosis not present

## 2016-12-17 DIAGNOSIS — Z940 Kidney transplant status: Secondary | ICD-10-CM | POA: Diagnosis not present

## 2016-12-17 DIAGNOSIS — D631 Anemia in chronic kidney disease: Secondary | ICD-10-CM | POA: Diagnosis present

## 2016-12-17 DIAGNOSIS — Z7982 Long term (current) use of aspirin: Secondary | ICD-10-CM

## 2016-12-17 DIAGNOSIS — A419 Sepsis, unspecified organism: Secondary | ICD-10-CM | POA: Diagnosis not present

## 2016-12-17 DIAGNOSIS — T814XXA Infection following a procedure, initial encounter: Secondary | ICD-10-CM | POA: Diagnosis not present

## 2016-12-17 DIAGNOSIS — K529 Noninfective gastroenteritis and colitis, unspecified: Secondary | ICD-10-CM | POA: Diagnosis present

## 2016-12-17 DIAGNOSIS — M7981 Nontraumatic hematoma of soft tissue: Secondary | ICD-10-CM | POA: Diagnosis not present

## 2016-12-17 DIAGNOSIS — D638 Anemia in other chronic diseases classified elsewhere: Secondary | ICD-10-CM

## 2016-12-17 DIAGNOSIS — M6289 Other specified disorders of muscle: Secondary | ICD-10-CM | POA: Diagnosis not present

## 2016-12-17 LAB — CBC WITH DIFFERENTIAL/PLATELET
Basophils Absolute: 0 10*3/uL (ref 0–0.1)
Basophils Relative: 0 %
EOS ABS: 0.1 10*3/uL (ref 0–0.7)
Eosinophils Relative: 2 %
HCT: 30.8 % — ABNORMAL LOW (ref 40.0–52.0)
HEMOGLOBIN: 10.3 g/dL — AB (ref 13.0–18.0)
LYMPHS ABS: 0.5 10*3/uL — AB (ref 1.0–3.6)
LYMPHS PCT: 9 %
MCH: 28.2 pg (ref 26.0–34.0)
MCHC: 33.6 g/dL (ref 32.0–36.0)
MCV: 83.9 fL (ref 80.0–100.0)
Monocytes Absolute: 1 10*3/uL (ref 0.2–1.0)
Monocytes Relative: 16 %
NEUTROS PCT: 73 %
Neutro Abs: 4.3 10*3/uL (ref 1.4–6.5)
Platelets: 179 10*3/uL (ref 150–440)
RBC: 3.67 MIL/uL — AB (ref 4.40–5.90)
RDW: 17.9 % — ABNORMAL HIGH (ref 11.5–14.5)
WBC: 5.8 10*3/uL (ref 3.8–10.6)

## 2016-12-17 LAB — INFLUENZA PANEL BY PCR (TYPE A & B)
Influenza A By PCR: NEGATIVE
Influenza B By PCR: NEGATIVE

## 2016-12-17 LAB — COMPREHENSIVE METABOLIC PANEL
ALK PHOS: 100 U/L (ref 38–126)
ALT: 5 U/L — AB (ref 17–63)
AST: 16 U/L (ref 15–41)
Albumin: 3.3 g/dL — ABNORMAL LOW (ref 3.5–5.0)
Anion gap: 13 (ref 5–15)
BUN: 44 mg/dL — ABNORMAL HIGH (ref 6–20)
CALCIUM: 8.2 mg/dL — AB (ref 8.9–10.3)
CO2: 24 mmol/L (ref 22–32)
CREATININE: 15.53 mg/dL — AB (ref 0.61–1.24)
Chloride: 95 mmol/L — ABNORMAL LOW (ref 101–111)
GFR calc Af Amer: 4 mL/min — ABNORMAL LOW (ref >60)
GFR calc non Af Amer: 3 mL/min — ABNORMAL LOW (ref >60)
GLUCOSE: 117 mg/dL — AB (ref 65–99)
Potassium: 4.6 mmol/L (ref 3.5–5.1)
SODIUM: 132 mmol/L — AB (ref 135–145)
Total Bilirubin: 0.6 mg/dL (ref 0.3–1.2)
Total Protein: 8 g/dL (ref 6.5–8.1)

## 2016-12-17 LAB — LACTIC ACID, PLASMA: LACTIC ACID, VENOUS: 0.7 mmol/L (ref 0.5–1.9)

## 2016-12-17 MED ORDER — VANCOMYCIN HCL IN DEXTROSE 750-5 MG/150ML-% IV SOLN
750.0000 mg | Freq: Once | INTRAVENOUS | Status: AC
  Administered 2016-12-17: 750 mg via INTRAVENOUS
  Filled 2016-12-17: qty 150

## 2016-12-17 MED ORDER — ACETAMINOPHEN 325 MG PO TABS
650.0000 mg | ORAL_TABLET | Freq: Once | ORAL | Status: AC
  Administered 2016-12-17: 650 mg via ORAL
  Filled 2016-12-17: qty 2

## 2016-12-17 MED ORDER — HYDRALAZINE HCL 50 MG PO TABS
ORAL_TABLET | ORAL | Status: AC
  Administered 2016-12-17: 25 mg
  Filled 2016-12-17: qty 1

## 2016-12-17 MED ORDER — PIPERACILLIN-TAZOBACTAM 3.375 G IVPB 30 MIN
3.3750 g | Freq: Once | INTRAVENOUS | Status: AC
  Administered 2016-12-17: 3.375 g via INTRAVENOUS
  Filled 2016-12-17: qty 50

## 2016-12-17 NOTE — ED Notes (Signed)
FIRST NURSE NOTE: Pt had surgery to right arm on Wednesday and had a wound vac placed temp 102 at home.  Pt reports chills, denies vomiting or diarrhea. Last Vac change was on Friday. HH comes to change wound vac.

## 2016-12-17 NOTE — ED Notes (Signed)
Pt returned from US at this time.

## 2016-12-17 NOTE — H&P (Signed)
History and Physical   [facility] - [facility] Admission History and Physical [employer], D.O.    Patient Name: Jonathon Conley MR#: [id number] Date of Birth: Oct 12, 1966 Date of Admission: 12/17/2016  Referring MD/NP/PA: Dr. Burlene Arnt Primary Care Physician: [name], MD Outpatient Specialists: Dr. Lucky Cowboy , [facility]  Patient coming from: Home  Chief Complaint: Arm pain  HPI: Jonathon Conley is a 51 y.o. male with a known history of anemia, asthma, end-stage renal disease on hemodialysis, hypertension, hyperlipidemia, obstructive sleep apnea was in a usual state of health until one week ago. Patient had a large right upper extremity hematoma which was drained and wound VAC placed on 12/13/2016. The hematoma developed after dialysis graft placement. He comes the emergency department tonight complaining of right upper extremity swelling associated with pain and temperature to 102. He denies any change in his wound VAC drainage consistency, color or rate. Patient states that he missed dialysis on Saturday. He has otherwise been well  Otherwise there has been no change in status. Patient has been taking medication as prescribed and there has been no recent change in medication or diet.  No recent antibiotics.  There has been no recent illness, hospitalizations, travel or sick contacts.    Patient denies weakness, dizziness, chest pain, shortness of breath, N/V/C/D, abdominal pain, dysuria/frequency, changes in mental status.   ED Course: patient received Zosyn, Vanco.  Review of Systems:  CONSTITUTIONAL:  Positive fever/chills,  negativefatigue, weakness, weight gain/loss, headache. EYES: No blurry or double vision. ENT: No tinnitus, postnasal drip, redness or soreness of the oropharynx. RESPIRATORY: No cough, dyspnea, wheeze.  No hemoptysis.  CARDIOVASCULAR: No chest pain, palpitations, syncope, orthopnea. No lower extremity edema.  GASTROINTESTINAL: No nausea,  vomiting, abdominal pain, diarrhea, constipation.  No hematemesis, melena or hematochezia. GENITOURINARY: No dysuria, frequency, hematuria. ENDOCRINE: No polyuria or nocturia. No heat or cold intolerance. HEMATOLOGY: No anemia, bruising, bleeding. INTEGUMENTARY: No rashes, ulcers, lesions. MUSCULOSKELETAL: No arthritis, gout, dyspnea. positive right upper extremity swelling and pain NEUROLOGIC: No numbness, tingling, ataxia, seizure-type activity, weakness. PSYCHIATRIC: No anxiety, depression, insomnia.   Past Medical History:  Diagnosis Date  . Anemia   . Asthma    childhood  . Blood transfusion without reported diagnosis   . Chronic kidney disease    Dialysis Tues, Thurs, Sat  . Colitis   . Diverticulosis 04/12/16   also seen: sigmoid and rectal erythema, path:   . Hemorrhoids 03/2016   bleeding at 04/12/16 colonoscopy, treated with  APC laser. cauterization  . Hyperlipidemia   . Hypertension   . OSA (obstructive sleep apnea) 03/27/2013  . Renal insufficiency     Past Surgical History:  Procedure Laterality Date  . APPLICATION OF WOUND VAC Right 12/13/2016   Procedure: APPLICATION OF WOUND VAC;  Surgeon: Algernon Huxley, MD;  Location: [facility];  Service: Vascular;  Laterality: Right;  . COLONOSCOPY N/A 04/12/2016   Procedure: COLONOSCOPY;  Surgeon: Mauri Pole, MD;  Location: [facility];  Service: Endoscopy;  Laterality: N/A;  . EXCHANGE OF A DIALYSIS CATHETER  11/20/2016   Procedure: Exchange Of A Dialysis Catheter;  Surgeon: Algernon Huxley, MD;  Location: [place] [facility];  Service: Cardiovascular;;  . fistulagrams Right   . HEMATOMA EVACUATION Right 12/13/2016   Procedure: EVACUATION HEMATOMA;  Surgeon: Algernon Huxley, MD;  Location: [facility];  Service: Vascular;  Laterality: Right;  . HOT HEMOSTASIS N/A 04/12/2016   Procedure: HOT HEMOSTASIS (ARGON PLASMA COAGULATION/BICAP);  Surgeon: Karleen Hampshire  Bary Richard, MD;  Location: [facility];  Service: Endoscopy;  Laterality: N/A;  .  KIDNEY TRANSPLANT    . PERIPHERAL VASCULAR CATHETERIZATION Bilateral 10/02/2016   Procedure: Upper Extremity Venography;  Surgeon: Algernon Huxley, MD;  Location: [place] [facility];  Service: Cardiovascular;  Laterality: Bilateral;  . PERIPHERAL VASCULAR CATHETERIZATION Right 11/08/2016   Procedure: DIALYSIS/PERMA CATHETER REMOVAL right Jugular;  Surgeon: Algernon Huxley, MD;  Location: [facility];  Service: Vascular;  Laterality: Right;  . PERIPHERAL VASCULAR CATHETERIZATION Left 11/08/2016   Procedure: DIALYSIS/PERMA CATHETER INSERTION left jugular with u/s guide and flouroscan;  Surgeon: Algernon Huxley, MD;  Location: [facility];  Service: Vascular;  Laterality: Left;  . PERIPHERAL VASCULAR CATHETERIZATION N/A 11/08/2016   Procedure: IVC FILTER INSERTION;  Surgeon: Algernon Huxley, MD;  Location: [facility];  Service: Vascular;  Laterality: N/A;  . perm a cath in Rt upper chest Right   . THORACENTESIS    . VASCULAR ACCESS DEVICE INSERTION Right 11/08/2016   Procedure: INSERTION OF HERO VASCULAR ACCESS DEVICE ( GRAFT );  Surgeon: Algernon Huxley, MD;  Location: [facility];  Service: Vascular;  Laterality: Right;     reports that he has never smoked. He has never used smokeless tobacco. He reports that he does not drink alcohol or use drugs.  Allergies  Allergen Reactions  . Lisinopril Swelling    Facial swelling    Family History  Problem Relation Age of Onset  . Heart disease Mother   . Cancer Mother     ovarian  . Cancer Maternal Grandmother   . Cancer Maternal Grandfather   . Asthma Son   . Asthma Son    Family history has been reviewed and confirmed with patient.   Prior to Admission medications   Medication Sig Start Date End Date Taking? Authorizing Provider  amLODipine (NORVASC) 10 MG tablet Take 10 mg by mouth daily.   Yes Historical Provider, MD  aspirin EC 81 MG tablet Take 81 mg by mouth daily.    Yes Historical Provider, MD  calcitRIOL (ROCALTROL) 0.5 MCG capsule Take 1 capsule (0.5 mcg  total) by mouth Every Tuesday,Thursday,and Saturday with dialysis. 09/24/15  Yes Orson Eva, MD  calcium acetate (PHOSLO) 667 MG capsule Take 667 mg by mouth 3 (three) times daily with meals.   Yes Historical Provider, MD  carvedilol (COREG) 25 MG tablet Take 25 mg by mouth 2 (two) times daily.    Yes Historical Provider, MD  cinacalcet (SENSIPAR) 30 MG tablet Take 30 mg by mouth 3 (three) times a week. Wyvonnia Lora, Sat after dialysis   Yes Historical Provider, MD  ferrous sulfate 325 (65 FE) MG tablet Take 325 mg by mouth 3 (three) times daily with meals. Reported on 04/09/2016   Yes Historical Provider, MD  hydrALAZINE (APRESOLINE) 50 MG tablet Take 25 mg by mouth 3 (three) times daily. Reported on 04/09/2016   Yes Historical Provider, MD  Mesalamine 800 MG TBEC Take 1 tablet (800 mg total) by mouth 3 (three) times daily. 05/03/16  Yes Amy S Esterwood, PA-C  oxyCODONE-acetaminophen (PERCOCET) 7.5-325 MG tablet Take 1 tablet by mouth every 4 (four) hours as needed for severe pain. 12/13/16  Yes Algernon Huxley, MD  simvastatin (ZOCOR) 5 MG tablet Take 1 tablet (5 mg total) by mouth at bedtime. 02/17/15  Yes Jonetta Osgood, MD    Physical Exam: Vitals:   12/17/16 2032 12/17/16 2133 12/17/16 2138 12/17/16 2204  BP:   (!) 181/110  Pulse:  100 99   Resp:  14 18   Temp: 99.9 F (37.7 C)   99 F (37.2 C)  TempSrc:    Oral  SpO2:  98% 97%   Weight:      Height:        GENERAL: 51 y.o.-year-old Lack male patient, well-developed, well-nourished lying in the bed in no acute distress.  Pleasant and cooperative.   HEENT: Head atraumatic, normocephalic. Pupils equal, round, reactive to light and accommodation. No scleral icterus. Extraocular muscles intact. Oropharynx is clear. Mucus membranes moist. NECK: Supple, full range of motion. No bruit heard.  CHEST: Normal breath sounds bilaterally. No wheezing, rales, rhonchi or crackles. No use of accessory muscles of respiration.  No reproducible chest wall  tenderness.  CARDIOVASCULAR: S1, S2 normal. No murmurs, rubs, or gallops. Cap refill <2 seconds. Pulses intact distally.  ABDOMEN: Soft, nondistended, nontender. No rebound, guarding, rigidity. Normoactive bowel sounds present in all four quadrants. No organomegaly or mass. EXTREMITIES: reveal aspect of the right bicep is enlarged, warm, tender to palpation. There is a wound VAC in place which is draining serosanguineous fluid. Pulses are intact No pedal edema, cyanosis, or clubbing. No calf tenderness or Homan's sign.  NEUROLOGIC: The patient is alert and oriented x 3. Cranial nerves II through XII are grossly intact with no focal sensorimotor deficit. Muscle strength 5/5 in all extremities. Sensation intact. Gait not checked. PSYCHIATRIC:  Normal affect, mood, thought content.    Labs on Admission:  CBC:  Recent Labs Lab 12/12/16 1138 12/13/16 1241 12/17/16 2124  WBC 4.9  --  5.8  NEUTROABS  --   --  4.3  HGB 10.9* 11.2* 10.3*  HCT 32.0* 33.0* 30.8*  MCV 84.4  --  83.9  PLT 184  --  443   Basic Metabolic Panel:  Recent Labs Lab 12/12/16 1138 12/13/16 1225 12/13/16 1241 12/17/16 2124  NA 135  --  138 132*  K 3.8 4.6 4.6 4.6  CL 99*  --   --  95*  CO2 27  --   --  24  GLUCOSE 81  --  87 117*  BUN 14  --   --  44*  CREATININE 5.76*  --   --  15.53*  CALCIUM 8.2*  --   --  8.2*   GFR: Estimated Creatinine Clearance: 5.9 mL/min (by C-G formula based on SCr of 15.53 mg/dL (H)). Liver Function Tests:  Recent Labs Lab 12/17/16 2124  AST 16  ALT 5*  ALKPHOS 100  BILITOT 0.6  PROT 8.0  ALBUMIN 3.3*   No results for input(s): LIPASE, AMYLASE in the last 168 hours. No results for input(s): AMMONIA in the last 168 hours. Coagulation Profile: No results for input(s): INR, PROTIME in the last 168 hours. Cardiac Enzymes: No results for input(s): CKTOTAL, CKMB, CKMBINDEX, TROPONINI in the last 168 hours. BNP (last 3 results) No results for input(s): PROBNP in the last  8760 hours. HbA1C: No results for input(s): HGBA1C in the last 72 hours. CBG: No results for input(s): GLUCAP in the last 168 hours. Lipid Profile: No results for input(s): CHOL, HDL, LDLCALC, TRIG, CHOLHDL, LDLDIRECT in the last 72 hours. Thyroid Function Tests: No results for input(s): TSH, T4TOTAL, FREET4, T3FREE, THYROIDAB in the last 72 hours. Anemia Panel: No results for input(s): VITAMINB12, FOLATE, FERRITIN, TIBC, IRON, RETICCTPCT in the last 72 hours. Urine analysis:    Component Value Date/Time   COLORURINE YELLOW 02/14/2015 [place] 02/14/2015  2213   LABSPEC 1.015 02/14/2015 2213   PHURINE 5.5 02/14/2015 2213   GLUCOSEU 100 (A) 02/14/2015 2213   HGBUR LARGE (A) 02/14/2015 2213   BILIRUBINUR NEGATIVE 02/14/2015 2213   [place] 02/14/2015 2213   PROTEINUR >300 (A) 02/14/2015 2213   UROBILINOGEN 0.2 02/14/2015 2213   NITRITE NEGATIVE 02/14/2015 2213   LEUKOCYTESUR NEGATIVE 02/14/2015 2213   Sepsis Labs: @LABRCNTIP (procalcitonin:4,lacticidven:4) ) Recent Results (from the past 240 hour(s))  Surgical pcr screen     Status: None   Collection Time: 12/12/16 11:38 AM  Result Value Ref Range Status   MRSA, PCR NEGATIVE NEGATIVE Final   Staphylococcus aureus NEGATIVE NEGATIVE Final    Comment:        The Xpert SA Assay (FDA approved for NASAL specimens in patients over 28 years of age), is one component of a comprehensive surveillance program.  Test performance has been validated by [facility] for patients greater than or equal to 3 year old. It is not intended to diagnose infection nor to guide or monitor treatment.   Aerobic/Anaerobic Culture (surgical/deep wound)     Status: None (Preliminary result)   Collection Time: 12/13/16  1:58 PM  Result Value Ref Range Status   Specimen Description WOUND RIGHT ARM  Final   Special Requests SEROMA POF ANCEF  Final   Gram Stain   Final    RARE WBC PRESENT,BOTH PMN AND MONONUCLEAR NO  ORGANISMS SEEN    Culture   Final    NO GROWTH 4 DAYS Performed at [facility], [address], [place], [place] [address]    Report Status PENDING  Incomplete     Radiological Exams on Admission: Dg Chest 2 View  Result Date: 12/17/2016 CLINICAL DATA:  Acute onset of fever. Patient has a right arm wound vac status post recent surgery. Initial encounter. EXAM: CHEST  2 VIEW COMPARISON:  Chest radiograph performed 11/18/2016 FINDINGS: A small right pleural effusion is again noted. Persistent mild right basilar infiltrate is again noted. The left lung appears relatively clear. No pneumothorax is seen. The heart is normal in size. A left-sided dual-lumen catheter is noted ending about the mid SVC. A right-sided vascular stent is noted. No acute osseous abnormalities are seen. Apparent soft tissue air is noted at the site of the patient's right arm wound vac. Would correlate clinically to exclude infection with a gas producing organism. IMPRESSION: 1. Small right pleural effusion again noted. Persistent mild right basilar airspace opacity seen. Underlying infection cannot be excluded. 2. Apparent soft tissue air noted at the site of the patient's right arm wound vac. Would correlate clinically to exclude infection with a gas producing organism. Electronically Signed   By: Garald Balding M.D.   On: 12/17/2016 20:05   [place] Extrem Up Right Ltd  Result Date: 12/17/2016 CLINICAL DATA:  Swelling under right biceps x4 days beginning the day after surgery (12/13/2016) to remove a seroma/ hematoma from this region. Wound VAC was placed at that time. Now with fever and chills. EXAM: ULTRASOUND RIGHT UPPER EXTREMITY LIMITED TECHNIQUE: Ultrasound examination of the upper extremity soft tissues was performed in the area of clinical concern. COMPARISON:  None FINDINGS: Interpretation is based on hard copies provided. There is an approximately 7.8 x 4.8 x 7.3 cm hypoechoic partially septated complex cystic  intramuscular fluid collection within what appears to be the biceps muscle consistent with an abscess. Echogenic foci may represent small foci of intraluminal gas. No internal vascularity. Subcutaneous edema is seen  of the overlying soft tissues. IMPRESSION: Findings consistent with an intramuscular abscess measuring 7.8 x 4.8 x 7.3 cm with epicenter believed to be within what appears to be the biceps. Electronically Signed   By: Ashley Royalty M.D.   On: 12/17/2016 20:51   Assessment/Plan  This is a 51 y.o. male with a history of anemia, asthma, end-stage renal disease on hemodialysis, hypertension, hyperlipidemia, obstructive sleep apnea now being admitted with:  1. Abscess of right arm -Admit inpatient -Continue Zosyn, vancomycin -Vascular surgery has been consulted for incision and drainage  2.End stage renal disease on hemodialysis -Nephrology consulted for HD -Continue calcitriol, PhosLo, Sensipar  3. History of hypertension -Continue Norvasc, Coreg, hydralazine  4. History of hyperlipidemia -Continue simvastatin  5. History of anemia -Continue iron  6. History of colitis -Continue mesalamine  Admission status:  inpatient IV Fluids:  Hep-Lock Diet/Nutrition:  renal Consults called:  vascular surgery, nephrology  DVT Px: Lovenox, SCDs and early ambulation. Code Status: Full Code  Disposition Plan:  to home in 1-2 days   All the records are reviewed and case discussed with ED provider. Management plans discussed with the patient and/or family who express understanding and agree with plan of care.  Jonathon Conley D.O. on 12/17/2016 at 10:39 PM Between 7am to 6pm - Pager - [phone] After 6pm go to [email or web address] - [employer] [facility] Office [phone] CC: Primary care physician; [name], MD   12/17/2016, 10:39 PM

## 2016-12-17 NOTE — ED Triage Notes (Addendum)
Pt presents to ED c/o fever and swelling following surgery and wound vac placement to R upper arm Wednesday. Home health changed vac on Friday. Pt states RUE has gradually increased in swelling since discharge. Pt denies change in character of drainage in vac.

## 2016-12-17 NOTE — ED Notes (Signed)
Pt is very difficult stick, has port to L chest. Pt states he would prefer for port to be accessed for blood work.

## 2016-12-17 NOTE — ED Notes (Signed)
Pt in US at this time 

## 2016-12-17 NOTE — ED Provider Notes (Signed)
[facility] Emergency Department Provider Note  ____________________________________________   I have reviewed the triage vital signs and the nursing notes.   HISTORY  Chief Complaint Post-op Problem    HPI Jonathon Conley is a 51 y.o. male who had recent surgery last Wednesday, today is Sunday, for an evacuation of a blood clot in the right upper shoulder with wound VAC in place. Patient states has been serosanguineous discharge from the wound VAC which has not changed rate however, the right bicep has gotten larger over the last 3 days and he started having low-grade fevers yesterday feeling worse today. Patient is an end-stage dialysis patient with last dialysis on Thursday. He missed yesterday because he did not feel well. The patient states that he has had no other symptoms no runny nose no cough no diarrhea no flu symptoms etc. He does complaint is mildly increased pain in that right arm. The patient states that he has had none of his blood pressure medications today. Nor did he take anything for fever recently. The patient states that he does have a dialysis port in the left chest which she is using for dialysis when he goes.      Past Medical History:  Diagnosis Date  . Anemia   . Asthma    childhood  . Blood transfusion without reported diagnosis   . Chronic kidney disease    Dialysis Tues, Thurs, Sat  . Colitis   . Diverticulosis 04/12/16   also seen: sigmoid and rectal erythema, path:   . Hemorrhoids 03/2016   bleeding at 04/12/16 colonoscopy, treated with  APC laser. cauterization  . Hyperlipidemia   . Hypertension   . OSA (obstructive sleep apnea) 03/27/2013  . Renal insufficiency     Patient Active Problem List   Diagnosis Date Noted  . Hematochezia 04/08/2016  . HTN (hypertension) 04/08/2016  . Intravenous line infection ([place]) 04/08/2016  . Insomnia 01/04/2016  . Malignant hypertension 12/14/2015  . Symptomatic anemia 11/03/2015  .  Chronic anemia 11/03/2015  . Thrombocytopenia ([place]) 11/03/2015  . Pleural effusion on right 11/03/2015  . Shortness of breath 11/02/2015  . Sepsis ([place]) 09/23/2015  . ESRD on dialysis ([place]) 09/22/2015  . Accelerated hypertension 09/22/2015  . Leg edema, left   . HCAP (healthcare-associated pneumonia) 09/21/2015  . Bacteremia due to Gram-positive bacteria 08/20/2015  . Acute blood loss anemia 08/12/2015  . C. difficile colitis 08/12/2015  . Bleeding gastrointestinal 08/12/2015  . Acute-on-chronic renal failure ([place]) 08/10/2015  . H/O kidney transplant 08/08/2015  . Non compliance with medical treatment 08/08/2015  . History of anemia 08/08/2015  . AKI (acute kidney injury) ([place]) 02/14/2015  . Renal transplant recipient 02/14/2015  . Acute on chronic diastolic congestive heart failure ([place]) 02/14/2015  . Anemia 02/14/2015  . Hyperlipidemia   . OSA (obstructive sleep apnea) 03/27/2013    Past Surgical History:  Procedure Laterality Date  . APPLICATION OF WOUND VAC Right 12/13/2016   Procedure: APPLICATION OF WOUND VAC;  Surgeon: Algernon Huxley, MD;  Location: [facility] ORS;  Service: Vascular;  Laterality: Right;  . COLONOSCOPY N/A 04/12/2016   Procedure: COLONOSCOPY;  Surgeon: Mauri Pole, MD;  Location: [place] [facility];  Service: Endoscopy;  Laterality: N/A;  . EXCHANGE OF A DIALYSIS CATHETER  11/20/2016   Procedure: Exchange Of A Dialysis Catheter;  Surgeon: Algernon Huxley, MD;  Location: [place] [facility];  Service: Cardiovascular;;  . fistulagrams Right   . HEMATOMA EVACUATION Right 12/13/2016   Procedure: EVACUATION HEMATOMA;  Surgeon: Algernon Huxley, MD;  Location: [facility] ORS;  Service: Vascular;  Laterality: Right;  . HOT HEMOSTASIS N/A 04/12/2016   Procedure: HOT HEMOSTASIS (ARGON PLASMA COAGULATION/BICAP);  Surgeon: Mauri Pole, MD;  Location: [facility] ENDOSCOPY;  Service: Endoscopy;  Laterality: N/A;  . KIDNEY TRANSPLANT    . PERIPHERAL VASCULAR CATHETERIZATION Bilateral 10/02/2016    Procedure: Upper Extremity Venography;  Surgeon: Algernon Huxley, MD;  Location: [place] [facility];  Service: Cardiovascular;  Laterality: Bilateral;  . PERIPHERAL VASCULAR CATHETERIZATION Right 11/08/2016   Procedure: DIALYSIS/PERMA CATHETER REMOVAL right Jugular;  Surgeon: Algernon Huxley, MD;  Location: [facility] ORS;  Service: Vascular;  Laterality: Right;  . PERIPHERAL VASCULAR CATHETERIZATION Left 11/08/2016   Procedure: DIALYSIS/PERMA CATHETER INSERTION left jugular with u/s guide and flouroscan;  Surgeon: Algernon Huxley, MD;  Location: [facility] ORS;  Service: Vascular;  Laterality: Left;  . PERIPHERAL VASCULAR CATHETERIZATION N/A 11/08/2016   Procedure: IVC FILTER INSERTION;  Surgeon: Algernon Huxley, MD;  Location: [facility] ORS;  Service: Vascular;  Laterality: N/A;  . perm a cath in Rt upper chest Right   . THORACENTESIS    . VASCULAR ACCESS DEVICE INSERTION Right 11/08/2016   Procedure: INSERTION OF HERO VASCULAR ACCESS DEVICE ( GRAFT );  Surgeon: Algernon Huxley, MD;  Location: [facility] ORS;  Service: Vascular;  Laterality: Right;    Prior to Admission medications   Medication Sig Start Date End Date Taking? Authorizing Provider  amLODipine (NORVASC) 10 MG tablet Take 10 mg by mouth daily.    Historical Provider, MD  aspirin EC 81 MG tablet Take 81 mg by mouth daily.     Historical Provider, MD  calcitRIOL (ROCALTROL) 0.5 MCG capsule Take 1 capsule (0.5 mcg total) by mouth Every Tuesday,Thursday,and Saturday with dialysis. 09/24/15   Orson Eva, MD  calcium acetate (PHOSLO) 667 MG capsule Take 667 mg by mouth 3 (three) times daily with meals.    Historical Provider, MD  carvedilol (COREG) 25 MG tablet Take 25 mg by mouth 2 (two) times daily.     Historical Provider, MD  cinacalcet (SENSIPAR) 30 MG tablet Take 30 mg by mouth 3 (three) times a week. Wyvonnia Lora, Sat after dialysis    Historical Provider, MD  ferrous sulfate 325 (65 FE) MG tablet Take 325 mg by mouth 3 (three) times daily with meals. Reported on 04/09/2016     Historical Provider, MD  hydrALAZINE (APRESOLINE) 50 MG tablet Take 25 mg by mouth 3 (three) times daily. Reported on 04/09/2016    Historical Provider, MD  Mesalamine 800 MG TBEC Take 1 tablet (800 mg total) by mouth 3 (three) times daily. 05/03/16   Amy S Esterwood, PA-C  oxyCODONE-acetaminophen (PERCOCET) 7.5-325 MG tablet Take 1 tablet by mouth every 4 (four) hours as needed for severe pain. 12/13/16   Algernon Huxley, MD  simvastatin (ZOCOR) 5 MG tablet Take 1 tablet (5 mg total) by mouth at bedtime. 02/17/15   Shanker Kristeen Mans, MD    Allergies Lisinopril  Family History  Problem Relation Age of Onset  . Heart disease Mother   . Cancer Mother     ovarian  . Cancer Maternal Grandmother   . Cancer Maternal Grandfather   . Asthma Son   . Asthma Son     Social History Social History  Substance Use Topics  . Smoking status: Never Smoker  . Smokeless tobacco: Never Used  . Alcohol use No    Review of Systems Constitutional: Positive fever/chills Eyes:  No visual changes. ENT: No sore throat. No stiff neck no neck pain Cardiovascular: Denies chest pain. Respiratory: Denies shortness of breath. Gastrointestinal:   no vomiting.  No diarrhea.  No constipation. Genitourinary: Negative for dysuria. Musculoskeletal: Negative lower extremity swelling positive right upper extremity swelling Skin: Negative for rash. Neurological: Negative for severe headaches, focal weakness or numbness. 10-point ROS otherwise negative.  ____________________________________________   PHYSICAL EXAM:  VITAL SIGNS: ED Triage Vitals  Enc Vitals Group     BP 12/17/16 1841 (!) 185/116     Pulse Rate 12/17/16 1841 (!) 122     Resp 12/17/16 1841 18     Temp 12/17/16 1841 (!) 102.4 F (39.1 C)     Temp Source 12/17/16 1841 Oral     SpO2 12/17/16 1841 97 %     Weight 12/17/16 1841 180 lb (81.6 kg)     Height 12/17/16 1841 5' 10"  (1.778 m)     Head Circumference --      Peak Flow --      Pain Score  12/17/16 1842 3     Pain Loc --      Pain Edu? --      Excl. in [place]? --     Constitutional: Alert and oriented. Well appearing and in no acute distress. Eyes: Conjunctivae are normal. PERRL. EOMI. Head: Atraumatic. Nose: No congestion/rhinnorhea. Mouth/Throat: Mucous membranes are moist.  Oropharynx non-erythematous. Neck: No stridor.   Nontender with no meningismus Cardiovascular: Normal rate, regular rhythm. Grossly normal heart sounds.  Good peripheral circulation. Respiratory: Normal respiratory effort.  No retractions. Lungs CTAB. Abdominal: Soft and nontender. No distention. No guarding no rebound Back:  There is no focal tenderness or step off.  there is no midline tenderness there are no lesions noted. there is no CVA tenderness Musculoskeletal: No lower extremity tenderness, Right biceps is quite swollen and somewhat tender. There is soft compartments however. It is warm to touch. There is a wound VAC which is draining serosanguineous but not purulent material. No joint effusions, no DVT signs strong distal pulses no edema Neurologic:  Normal speech and language. No gross focal neurologic deficits are appreciated.  Skin:  Skin is warm, dry and intact. No rash noted. Psychiatric: Mood and affect are normal. Speech and behavior are normal.  ____________________________________________   LABS (all labs ordered are listed, but only abnormal results are displayed)  Labs Reviewed  CULTURE, BLOOD (ROUTINE X 2)  CULTURE, BLOOD (ROUTINE X 2)  LACTIC ACID, PLASMA  LACTIC ACID, PLASMA  COMPREHENSIVE METABOLIC PANEL  CBC WITH DIFFERENTIAL/PLATELET  INFLUENZA PANEL BY PCR (TYPE A & B)   ____________________________________________  EKG  I personally interpreted any EKGs ordered by me or triage  ____________________________________________  RADIOLOGY  I reviewed any imaging ordered by me or triage that were performed during my shift and, if possible, patient and/or family made  aware of any abnormal findings. ____________________________________________   PROCEDURES  Procedure(s) performed: None  Procedures  Critical Care performed: None  ____________________________________________   INITIAL IMPRESSION / ASSESSMENT AND PLAN / ED COURSE  Pertinent labs & imaging results that were available during my care of the patient were reviewed by me and considered in my medical decision making (see chart for details).  Patient here with concern for infected wound site. No other obvious source of infection. He does have indwelling catheter. I have ordered vancomycin and Zosyn unfortunately patient with a very difficult IV stick and he states usually they have to do an ultrasound  which we have not done. We do have IV access. In the meantime, I have discussed the ultrasound findings and fever with vascular surgery, Dr. Lorenso Courier , who understands that there is a large abscess there wishes the patient to be admitted to medicine for possible surgery in the morning. She and I did discuss the size of the abscess and the presence of fever. It is her feeling he is safe to wait for morning. She does agree with bank and Zosyn. Blood work is pending at this time.  Of concern also is the fact the patient's blood pressures elevated this is likely because he skipped dialysis on Saturday and therefore has not had dialysis since Thursday. In addition he did not take any of his blood pressure medication. I am going to obtain electrolytes to ensure that he is not dangerously elevated on his potassium. He has no other symptoms at this time. Blood cultures have been obtained because he does have an indwelling catheter and he has a fever with a known source of infection. I will be not giving him large amounts of IV fluid because of the fact that he has missed dialysis pressures are up even though he has a fever and a small tachycardia, I don't think he is septic. He certainly does not appear to be toxic  he is watching TV and laughing.  ____________________________________________   FINAL CLINICAL IMPRESSION(S) / ED DIAGNOSES  Final diagnoses:  Swelling of arm      This chart was dictated using voice recognition software.  Despite best efforts to proofread,  errors can occur which can change meaning.      Schuyler Amor, MD 12/17/16 2125

## 2016-12-18 ENCOUNTER — Encounter: Payer: Self-pay | Admitting: *Deleted

## 2016-12-18 ENCOUNTER — Encounter: Payer: Self-pay | Admitting: Registered Nurse

## 2016-12-18 ENCOUNTER — Encounter
Admission: EM | Disposition: A | Discharge status: Home / Self Care | Payer: Self-pay | Source: Home / Self Care | Attending: Internal Medicine

## 2016-12-18 LAB — CBC
HEMATOCRIT: 26.6 % — AB (ref 40.0–52.0)
Hemoglobin: 8.9 g/dL — ABNORMAL LOW (ref 13.0–18.0)
MCH: 27.8 pg (ref 26.0–34.0)
MCHC: 33.4 g/dL (ref 32.0–36.0)
MCV: 83.2 fL (ref 80.0–100.0)
Platelets: 162 10*3/uL (ref 150–440)
RBC: 3.2 MIL/uL — ABNORMAL LOW (ref 4.40–5.90)
RDW: 17.8 % — AB (ref 11.5–14.5)
WBC: 5.3 10*3/uL (ref 3.8–10.6)

## 2016-12-18 LAB — AEROBIC/ANAEROBIC CULTURE W GRAM STAIN (SURGICAL/DEEP WOUND): Culture: NO GROWTH

## 2016-12-18 LAB — RENAL FUNCTION PANEL
ALBUMIN: 2.7 g/dL — AB (ref 3.5–5.0)
Anion gap: 13 (ref 5–15)
BUN: 50 mg/dL — ABNORMAL HIGH (ref 6–20)
CO2: 25 mmol/L (ref 22–32)
Calcium: 8 mg/dL — ABNORMAL LOW (ref 8.9–10.3)
Chloride: 96 mmol/L — ABNORMAL LOW (ref 101–111)
Creatinine, Ser: 16.86 mg/dL — ABNORMAL HIGH (ref 0.61–1.24)
GFR calc Af Amer: 3 mL/min — ABNORMAL LOW (ref >60)
GFR calc non Af Amer: 3 mL/min — ABNORMAL LOW (ref >60)
GLUCOSE: 95 mg/dL (ref 65–99)
PHOSPHORUS: 3.9 mg/dL (ref 2.5–4.6)
POTASSIUM: 4.4 mmol/L (ref 3.5–5.1)
Sodium: 134 mmol/L — ABNORMAL LOW (ref 135–145)

## 2016-12-18 LAB — COMPREHENSIVE METABOLIC PANEL
ALBUMIN: 2.8 g/dL — AB (ref 3.5–5.0)
ALT: <5 U/L — ABNORMAL LOW (ref 17–63)
ANION GAP: 12 (ref 5–15)
AST: 15 U/L (ref 15–41)
Alkaline Phosphatase: 86 U/L (ref 38–126)
BILIRUBIN TOTAL: 0.6 mg/dL (ref 0.3–1.2)
BUN: 49 mg/dL — ABNORMAL HIGH (ref 6–20)
CO2: 26 mmol/L (ref 22–32)
Calcium: 8 mg/dL — ABNORMAL LOW (ref 8.9–10.3)
Chloride: 96 mmol/L — ABNORMAL LOW (ref 101–111)
Creatinine, Ser: 16.7 mg/dL — ABNORMAL HIGH (ref 0.61–1.24)
GFR calc non Af Amer: 3 mL/min — ABNORMAL LOW (ref >60)
GFR, EST AFRICAN AMERICAN: 3 mL/min — AB (ref >60)
GLUCOSE: 98 mg/dL (ref 65–99)
POTASSIUM: 4.5 mmol/L (ref 3.5–5.1)
SODIUM: 134 mmol/L — AB (ref 135–145)
TOTAL PROTEIN: 6.7 g/dL (ref 6.5–8.1)

## 2016-12-18 LAB — AEROBIC/ANAEROBIC CULTURE (SURGICAL/DEEP WOUND)

## 2016-12-18 SURGERY — IRRIGATION AND DEBRIDEMENT ABSCESS
Anesthesia: Choice | Laterality: Right

## 2016-12-18 MED ORDER — OXYCODONE HCL 5 MG PO TABS: 10.0000 mg | ORAL_TABLET | ORAL | Status: DC | PRN

## 2016-12-18 MED ORDER — ASPIRIN EC 81 MG PO TBEC
81.0000 mg | DELAYED_RELEASE_TABLET | Freq: Every day | ORAL | Status: DC
Start: 2016-12-18 — End: 2016-12-19
  Administered 2016-12-18 – 2016-12-19 (×2): 81 mg via ORAL
  Filled 2016-12-18 (×2): qty 1

## 2016-12-18 MED ORDER — MESALAMINE 400 MG PO CPDR
800.0000 mg | DELAYED_RELEASE_CAPSULE | Freq: Three times a day (TID) | ORAL | Status: DC
  Administered 2016-12-18 – 2016-12-19 (×2): 800 mg via ORAL
  Filled 2016-12-18 (×5): qty 2

## 2016-12-18 MED ORDER — ONDANSETRON HCL 4 MG PO TABS: 4.0000 mg | ORAL_TABLET | Freq: Four times a day (QID) | ORAL | Status: DC | PRN

## 2016-12-18 MED ORDER — AMLODIPINE BESYLATE 10 MG PO TABS
10.0000 mg | ORAL_TABLET | Freq: Every day | ORAL | Status: DC
  Administered 2016-12-18: 10 mg via ORAL
  Filled 2016-12-18 (×2): qty 1

## 2016-12-18 MED ORDER — CARVEDILOL 25 MG PO TABS
ORAL_TABLET | ORAL | Status: AC
  Administered 2016-12-18: 25 mg
  Filled 2016-12-18: qty 1

## 2016-12-18 MED ORDER — SODIUM CHLORIDE 0.9% FLUSH
3.0000 mL | Freq: Two times a day (BID) | INTRAVENOUS | Status: DC
  Administered 2016-12-18 (×2): 3 mL via INTRAVENOUS

## 2016-12-18 MED ORDER — ACETAMINOPHEN 650 MG RE SUPP: 650.0000 mg | Freq: Four times a day (QID) | RECTAL | Status: DC | PRN

## 2016-12-18 MED ORDER — MIDAZOLAM HCL 2 MG/2ML IJ SOLN
INTRAMUSCULAR | Status: AC
  Filled 2016-12-18: qty 2

## 2016-12-18 MED ORDER — PIPERACILLIN-TAZOBACTAM 3.375 G IVPB
3.3750 g | Freq: Two times a day (BID) | INTRAVENOUS | Status: DC
  Administered 2016-12-18 (×2): 3.375 g via INTRAVENOUS
  Filled 2016-12-18 (×2): qty 50

## 2016-12-18 MED ORDER — BISACODYL 5 MG PO TBEC: 5.0000 mg | DELAYED_RELEASE_TABLET | Freq: Every day | ORAL | Status: DC | PRN

## 2016-12-18 MED ORDER — SODIUM CHLORIDE 0.9% FLUSH: 3.0000 mL | INTRAVENOUS | Status: DC | PRN

## 2016-12-18 MED ORDER — SENNOSIDES-DOCUSATE SODIUM 8.6-50 MG PO TABS: 1.0000 | ORAL_TABLET | Freq: Every evening | ORAL | Status: DC | PRN

## 2016-12-18 MED ORDER — PROPOFOL 10 MG/ML IV BOLUS
INTRAVENOUS | Status: AC
  Filled 2016-12-18: qty 20

## 2016-12-18 MED ORDER — ALBUTEROL SULFATE (2.5 MG/3ML) 0.083% IN NEBU: 2.5000 mg | INHALATION_SOLUTION | Freq: Four times a day (QID) | RESPIRATORY_TRACT | Status: DC | PRN

## 2016-12-18 MED ORDER — FERROUS SULFATE 325 (65 FE) MG PO TABS
325.0000 mg | ORAL_TABLET | Freq: Three times a day (TID) | ORAL | Status: DC
  Administered 2016-12-18 (×2): 325 mg via ORAL
  Filled 2016-12-18 (×2): qty 1

## 2016-12-18 MED ORDER — CARVEDILOL 12.5 MG PO TABS
25.0000 mg | ORAL_TABLET | Freq: Two times a day (BID) | ORAL | Status: DC
  Administered 2016-12-18 – 2016-12-19 (×3): 25 mg via ORAL
  Filled 2016-12-18 (×3): qty 2

## 2016-12-18 MED ORDER — CALCITRIOL 0.25 MCG PO CAPS: 0.5000 ug | ORAL_CAPSULE | ORAL | Status: DC

## 2016-12-18 MED ORDER — IPRATROPIUM BROMIDE 0.02 % IN SOLN: 0.5000 mg | Freq: Four times a day (QID) | RESPIRATORY_TRACT | Status: DC | PRN

## 2016-12-18 MED ORDER — ZOLPIDEM TARTRATE 5 MG PO TABS: 5.0000 mg | ORAL_TABLET | Freq: Every evening | ORAL | Status: DC | PRN

## 2016-12-18 MED ORDER — HYDRALAZINE HCL 20 MG/ML IJ SOLN
10.0000 mg | INTRAMUSCULAR | Status: DC | PRN
  Administered 2016-12-18: 10 mg via INTRAVENOUS
  Filled 2016-12-18: qty 1

## 2016-12-18 MED ORDER — MAGNESIUM CITRATE PO SOLN
1.0000 | Freq: Once | ORAL | Status: DC | PRN
  Filled 2016-12-18: qty 296

## 2016-12-18 MED ORDER — ACETAMINOPHEN 325 MG PO TABS: 650.0000 mg | ORAL_TABLET | Freq: Four times a day (QID) | ORAL | Status: DC | PRN

## 2016-12-18 MED ORDER — OXYCODONE HCL 5 MG PO TABS
5.0000 mg | ORAL_TABLET | ORAL | Status: DC | PRN
  Administered 2016-12-18: 5 mg via ORAL
  Filled 2016-12-18: qty 1

## 2016-12-18 MED ORDER — HYDRALAZINE HCL 25 MG PO TABS
25.0000 mg | ORAL_TABLET | Freq: Three times a day (TID) | ORAL | Status: DC
  Administered 2016-12-18 (×3): 25 mg via ORAL
  Filled 2016-12-18 (×3): qty 1

## 2016-12-18 MED ORDER — MORPHINE SULFATE (PF) 4 MG/ML IV SOLN
4.0000 mg | INTRAVENOUS | Status: DC | PRN
  Administered 2016-12-18 (×3): 4 mg via INTRAVENOUS
  Filled 2016-12-18 (×4): qty 1

## 2016-12-18 MED ORDER — HEPARIN SODIUM (PORCINE) 5000 UNIT/ML IJ SOLN
5000.0000 [IU] | Freq: Three times a day (TID) | INTRAMUSCULAR | Status: DC
  Administered 2016-12-18 – 2016-12-19 (×3): 5000 [IU] via SUBCUTANEOUS
  Filled 2016-12-18 (×3): qty 1

## 2016-12-18 MED ORDER — LIDOCAINE HCL (PF) 2 % IJ SOLN
INTRAMUSCULAR | Status: AC
  Filled 2016-12-18: qty 2

## 2016-12-18 MED ORDER — VANCOMYCIN HCL 500 MG IV SOLR
500.0000 mg | Freq: Once | INTRAVENOUS | Status: AC
  Administered 2016-12-18: 500 mg via INTRAVENOUS
  Filled 2016-12-18: qty 500

## 2016-12-18 MED ORDER — MESALAMINE 800 MG PO TBEC
800.0000 mg | DELAYED_RELEASE_TABLET | Freq: Three times a day (TID) | ORAL | Status: DC
  Filled 2016-12-18 (×3): qty 1

## 2016-12-18 MED ORDER — CALCIUM ACETATE 667 MG PO CAPS
667.0000 mg | ORAL_CAPSULE | Freq: Three times a day (TID) | ORAL | Status: DC
  Administered 2016-12-18 (×2): 667 mg via ORAL
  Filled 2016-12-18 (×8): qty 1

## 2016-12-18 MED ORDER — FENTANYL CITRATE (PF) 100 MCG/2ML IJ SOLN
INTRAMUSCULAR | Status: AC
  Filled 2016-12-18: qty 2

## 2016-12-18 MED ORDER — SODIUM CHLORIDE 0.9 % IV SOLN: 250.0000 mL | INTRAVENOUS | Status: DC | PRN

## 2016-12-18 MED ORDER — CINACALCET HCL 30 MG PO TABS
30.0000 mg | ORAL_TABLET | ORAL | Status: DC
  Administered 2016-12-18: 30 mg via ORAL
  Filled 2016-12-18: qty 1

## 2016-12-18 MED ORDER — SIMVASTATIN 5 MG PO TABS
5.0000 mg | ORAL_TABLET | Freq: Every day | ORAL | Status: DC
  Administered 2016-12-18: 5 mg via ORAL
  Filled 2016-12-18: qty 1

## 2016-12-18 MED ORDER — ONDANSETRON HCL 4 MG/2ML IJ SOLN
4.0000 mg | Freq: Four times a day (QID) | INTRAMUSCULAR | Status: DC | PRN
  Administered 2016-12-19: 4 mg via INTRAVENOUS
  Filled 2016-12-18: qty 2

## 2016-12-18 SURGICAL SUPPLY — 20 items
CANISTER SUCT 1200ML W/VALVE (MISCELLANEOUS) ×3 IMPLANT
DRAPE LAPAROTOMY 100X77 ABD (DRAPES) ×3 IMPLANT
ELECT REM PT RETURN 9FT ADLT (ELECTROSURGICAL) ×3
ELECTRODE REM PT RTRN 9FT ADLT (ELECTROSURGICAL) ×1 IMPLANT
GAUZE SPONGE 4X4 12PLY STRL (GAUZE/BANDAGES/DRESSINGS) ×3 IMPLANT
GLOVE BIO SURGEON STRL SZ7 (GLOVE) ×3 IMPLANT
GLOVE INDICATOR 8.0 STRL GRN (GLOVE) ×3 IMPLANT
GOWN STRL REUS W/ TWL LRG LVL3 (GOWN DISPOSABLE) ×2 IMPLANT
GOWN STRL REUS W/TWL LRG LVL3 (GOWN DISPOSABLE) ×6
KIT RM TURNOVER STRD PROC AR (KITS) ×3 IMPLANT
LABEL OR SOLS (LABEL) ×3 IMPLANT
NDL HYPO 25X1 1.5 SAFETY (NEEDLE) ×1 IMPLANT
NEEDLE HYPO 25X1 1.5 SAFETY (NEEDLE) ×3 IMPLANT
NS IRRIG 500ML POUR BTL (IV SOLUTION) ×3 IMPLANT
PACK BASIN MINOR ARMC (MISCELLANEOUS) ×3 IMPLANT
SUT VIC AB 2-0 SH 27 (SUTURE) ×3
SUT VIC AB 2-0 SH 27XBRD (SUTURE) ×1 IMPLANT
SUT VIC AB 3-0 SH 27 (SUTURE) ×3
SUT VIC AB 3-0 SH 27X BRD (SUTURE) ×1 IMPLANT
SYRINGE 10CC LL (SYRINGE) ×3 IMPLANT

## 2016-12-18 NOTE — Progress Notes (Signed)
Pre hd 

## 2016-12-18 NOTE — Progress Notes (Signed)
Pre HD  

## 2016-12-18 NOTE — Consult Note (Signed)
Date: 12/18/2016                  Patient Name:  Jonathon Conley  MRN: [id number]  DOB: [birth year]  Age / Sex: 51 y.o., male         PCP: [name], MD                 Service Requesting Consult: IM                 Reason for Consult: ESRD            History of Present Illness: Patient is a 51 y.o. male with medical problems of eSRD, hypertension, OSA, who was admitted to [facility] on 12/17/2016 for evaluation of right arm pain and swelling. Patient had a HERO dialysis graft placed several weeks ago (11/08/16).  Last week, and large seroma from the right was drained.  A wound VAC was placed.  Patient has some reaccumulation of fluid in the surgical area despite wound VAC.  He presented with a fever to 102.4.  He was admitted for further evaluation and management. Nephrology team has been requested to evaluate for dialysis.  Patient's last dialysis treatment was on Thursday.  His normal day that Tuesday, Thursday, Saturday.  He dialyzes at [facility], [employer].  And is followed by [facility] nephrology. EDW 80.5 kg.  Uses calcium acetate as binders.  His enuretic.    Medications: Outpatient medications: Facility-Administered Medications Prior to Admission  Medication Dose Route Frequency Provider Last Rate Last Dose  . ceFAZolin (ANCEF) IVPB 1 g/50 mL premix  1 g Intravenous Once Algernon Huxley, MD       Prescriptions Prior to Admission  Medication Sig Dispense Refill Last Dose  . amLODipine (NORVASC) 10 MG tablet Take 10 mg by mouth daily.   12/16/2016 at Unknown time  . aspirin EC 81 MG tablet Take 81 mg by mouth daily.    12/16/2016 at Unknown time  . calcitRIOL (ROCALTROL) 0.5 MCG capsule Take 1 capsule (0.5 mcg total) by mouth Every Tuesday,Thursday,and Saturday with dialysis. 15 capsule 0 Past Week at Unknown time  . calcium acetate (PHOSLO) 667 MG capsule Take 667 mg by mouth 3 (three) times daily with meals.   12/16/2016 at Unknown time  . carvedilol (COREG) 25 MG tablet Take  25 mg by mouth 2 (two) times daily.   0 12/16/2016 at Unknown time  . cinacalcet (SENSIPAR) 30 MG tablet Take 30 mg by mouth 3 (three) times a week. Tue, Thurs, Sat after dialysis   12/16/2016 at Unknown time  . ferrous sulfate 325 (65 FE) MG tablet Take 325 mg by mouth 3 (three) times daily with meals. Reported on 04/09/2016   12/16/2016 at Unknown time  . hydrALAZINE (APRESOLINE) 50 MG tablet Take 25 mg by mouth 3 (three) times daily. Reported on 04/09/2016   12/16/2016 at Unknown time  . Mesalamine 800 MG TBEC Take 1 tablet (800 mg total) by mouth 3 (three) times daily. 90 tablet 6 12/16/2016 at Unknown time  . oxyCODONE-acetaminophen (PERCOCET) 7.5-325 MG tablet Take 1 tablet by mouth every 4 (four) hours as needed for severe pain. 30 tablet 0 12/16/2016 at Unknown time  . simvastatin (ZOCOR) 5 MG tablet Take 1 tablet (5 mg total) by mouth at bedtime. 30 tablet 0 12/16/2016 at Unknown time    Current medications: Current Facility-Administered Medications  Medication Dose Route Frequency Provider Last Rate Last Dose  . 0.9 %  sodium chloride infusion  250 mL Intravenous PRN Alexis Hugelmeyer, DO      . acetaminophen (TYLENOL) tablet 650 mg  650 mg Oral Q6H PRN Alexis Hugelmeyer, DO       Or  . acetaminophen (TYLENOL) suppository 650 mg  650 mg Rectal Q6H PRN Alexis Hugelmeyer, DO      . albuterol (PROVENTIL) (2.5 MG/3ML) 0.083% nebulizer solution 2.5 mg  2.5 mg Nebulization Q6H PRN Alexis Hugelmeyer, DO      . amLODipine (NORVASC) tablet 10 mg  10 mg Oral Daily Alexis Hugelmeyer, DO   10 mg at 12/18/16 0947  . aspirin EC tablet 81 mg  81 mg Oral Daily Alexis Hugelmeyer, DO   81 mg at 12/18/16 0948  . bisacodyl (DULCOLAX) EC tablet 5 mg  5 mg Oral Daily PRN Alexis Hugelmeyer, DO      . [START ON 12/19/2016] calcitRIOL (ROCALTROL) capsule 0.5 mcg  0.5 mcg Oral Q T,Th,Sa-HD Alexis Hugelmeyer, DO      . calcium acetate (PHOSLO) capsule 667 mg  667 mg Oral TID WC Alexis Hugelmeyer, DO   667 mg at 12/18/16 1742  .  carvedilol (COREG) tablet 25 mg  25 mg Oral BID Alexis Hugelmeyer, DO   25 mg at 12/18/16 0947  . cinacalcet (SENSIPAR) tablet 30 mg  30 mg Oral Once per day on Mon Wed Fri [facility], DO   30 mg at 12/18/16 5916  . ferrous sulfate tablet 325 mg  325 mg Oral TID WC Alexis Hugelmeyer, DO   325 mg at 12/18/16 1741  . heparin injection 5,000 Units  5,000 Units Subcutaneous Q8H Alexis Hugelmeyer, DO   5,000 Units at 12/18/16 0630  . hydrALAZINE (APRESOLINE) injection 10 mg  10 mg Intravenous Q4H PRN Saundra Shelling, MD   10 mg at 12/18/16 0317  . hydrALAZINE (APRESOLINE) tablet 25 mg  25 mg Oral TID Alexis Hugelmeyer, DO   25 mg at 12/18/16 1742  . ipratropium (ATROVENT) nebulizer solution 0.5 mg  0.5 mg Nebulization Q6H PRN Alexis Hugelmeyer, DO      . magnesium citrate solution 1 Bottle  1 Bottle Oral Once PRN Alexis Hugelmeyer, DO      . Mesalamine (ASACOL) DR capsule 800 mg  800 mg Oral TID Theodoro Grist, MD      . morphine 4 MG/ML injection 4 mg  4 mg Intravenous Q4H PRN Theodoro Grist, MD   4 mg at 12/18/16 1742  . ondansetron (ZOFRAN) tablet 4 mg  4 mg Oral Q6H PRN Alexis Hugelmeyer, DO       Or  . ondansetron (ZOFRAN) injection 4 mg  4 mg Intravenous Q6H PRN Alexis Hugelmeyer, DO      . oxyCODONE (Oxy IR/ROXICODONE) immediate release tablet 10 mg  10 mg Oral Q4H PRN Theodoro Grist, MD      . piperacillin-tazobactam (ZOSYN) IVPB 3.375 g  3.375 g Intravenous Q12H Alexis Hugelmeyer, DO   3.375 g at 12/18/16 0730  . senna-docusate (Senokot-S) tablet 1 tablet  1 tablet Oral QHS PRN Alexis Hugelmeyer, DO      . simvastatin (ZOCOR) tablet 5 mg  5 mg Oral QHS Alexis Hugelmeyer, DO      . sodium chloride flush (NS) 0.9 % injection 3 mL  3 mL Intravenous Q12H Alexis Hugelmeyer, DO   3 mL at 12/18/16 0315  . sodium chloride flush (NS) 0.9 % injection 3 mL  3 mL Intravenous PRN Alexis Hugelmeyer, DO      . zolpidem (AMBIEN) tablet  5 mg  5 mg Oral QHS PRN,MR X 1 Alexis Hugelmeyer, DO           Allergies: Allergies  Allergen Reactions  . Lisinopril Swelling    Facial swelling      Past Medical History: Past Medical History:  Diagnosis Date  . Anemia   . Asthma    childhood  . Blood transfusion without reported diagnosis   . Chronic kidney disease    Dialysis Tues, Thurs, Sat  . Colitis   . Diverticulosis 04/12/16   also seen: sigmoid and rectal erythema, path:   . Hemorrhoids 03/2016   bleeding at 04/12/16 colonoscopy, treated with  APC laser. cauterization  . Hyperlipidemia   . Hypertension   . OSA (obstructive sleep apnea) 03/27/2013  . Renal insufficiency      Past Surgical History: Past Surgical History:  Procedure Laterality Date  . APPLICATION OF WOUND VAC Right 12/13/2016   Procedure: APPLICATION OF WOUND VAC;  Surgeon: Algernon Huxley, MD;  Location: [facility] ORS;  Service: Vascular;  Laterality: Right;  . COLONOSCOPY N/A 04/12/2016   Procedure: COLONOSCOPY;  Surgeon: Mauri Pole, MD;  Location: [facility];  Service: Endoscopy;  Laterality: N/A;  . EXCHANGE OF A DIALYSIS CATHETER  11/20/2016   Procedure: Exchange Of A Dialysis Catheter;  Surgeon: Algernon Huxley, MD;  Location: [facility];  Service: Cardiovascular;;  . fistulagrams Right   . HEMATOMA EVACUATION Right 12/13/2016   Procedure: EVACUATION HEMATOMA;  Surgeon: Algernon Huxley, MD;  Location: [facility] ORS;  Service: Vascular;  Laterality: Right;  . HOT HEMOSTASIS N/A 04/12/2016   Procedure: HOT HEMOSTASIS (ARGON PLASMA COAGULATION/BICAP);  Surgeon: Mauri Pole, MD;  Location: [facility] ENDOSCOPY;  Service: Endoscopy;  Laterality: N/A;  . KIDNEY TRANSPLANT    . PERIPHERAL VASCULAR CATHETERIZATION Bilateral 10/02/2016   Procedure: Upper Extremity Venography;  Surgeon: Algernon Huxley, MD;  Location: [place] [facility];  Service: Cardiovascular;  Laterality: Bilateral;  . PERIPHERAL VASCULAR CATHETERIZATION Right 11/08/2016   Procedure: DIALYSIS/PERMA CATHETER REMOVAL right Jugular;  Surgeon: Algernon Huxley, MD;  Location: [facility] ORS;  Service: Vascular;  Laterality: Right;  . PERIPHERAL VASCULAR CATHETERIZATION Left 11/08/2016   Procedure: DIALYSIS/PERMA CATHETER INSERTION left jugular with u/s guide and flouroscan;  Surgeon: Algernon Huxley, MD;  Location: [facility] ORS;  Service: Vascular;  Laterality: Left;  . PERIPHERAL VASCULAR CATHETERIZATION N/A 11/08/2016   Procedure: IVC FILTER INSERTION;  Surgeon: Algernon Huxley, MD;  Location: [facility] ORS;  Service: Vascular;  Laterality: N/A;  . perm a cath in Rt upper chest Right   . THORACENTESIS    . VASCULAR ACCESS DEVICE INSERTION Right 11/08/2016   Procedure: INSERTION OF HERO VASCULAR ACCESS DEVICE ( GRAFT );  Surgeon: Algernon Huxley, MD;  Location: [facility] ORS;  Service: Vascular;  Laterality: Right;     Family History: Family History  Problem Relation Age of Onset  . Heart disease Mother   . Cancer Mother     ovarian  . Cancer Maternal Grandmother   . Cancer Maternal Grandfather   . Asthma Son   . Asthma Son      Social History: Social History   Social History  . Marital status: Married    Spouse name: N/A  . Number of children: N/A  . Years of education: N/A   Occupational History  . Not on file.   Social History Main Topics  . Smoking status: Never Smoker  . Smokeless tobacco: Never Used  .  Alcohol use No  . Drug use: No  . Sexual activity: Not on file   Other Topics Concern  . Not on file   Social History Narrative  . No narrative on file     Review of Systems: Gen: Fever upon presentation HEENT: no hearing or vision complaints CV: no chest pain or shortness of breath Resp: no cough or sputum production YB:FXOVANVB is good GU : anuric MS: pain in the right lower surgical site Derm:  no complaints Psych:no complaints Heme: no complaints Neuro: no complaints Endocrine.  No complaints  Vital Signs: Blood pressure (!) 166/96, pulse 96, temperature 99.7 F (37.6 C), temperature source Oral, resp. rate 12, height 5' 10"   (1.778 m), weight 82.5 kg (181 lb 14.4 oz), SpO2 100 %.   Intake/Output Summary (Last 24 hours) at 12/18/16 1809 Last data filed at 12/18/16 1620  Gross per 24 hour  Intake              240 ml  Output              -97 ml  Net              337 ml    Weight trends: Filed Weights   12/17/16 1841 12/18/16 1230 12/18/16 1720  Weight: 81.6 kg (180 lb) 80.7 kg (177 lb 14.6 oz) 82.5 kg (181 lb 14.4 oz)    Physical Exam: General:  no acute distress, laying in the bed  HEENT Anicteric, moist oral mucous membranes  Neck:  supple  Lungs: Normal breathing effort, clear to auscultation bilaterally  Heart::  regular rhythm, no rub or gallop  Abdomen: Soft, nontender  Extremities:  right arm wound VAC, upper arm swelling  Neurologic: Alert, oriented  Skin: No acute rashes  Access: Left IJ PermCath          Lab results: Basic Metabolic Panel:  Recent Labs Lab 12/17/16 2124 12/18/16 1233 12/18/16 1240  NA 132* 134* 134*  K 4.6 4.4 4.5  CL 95* 96* 96*  CO2 24 25 26   GLUCOSE 117* 95 98  BUN 44* 50* 49*  CREATININE 15.53* 16.86* 16.70*  CALCIUM 8.2* 8.0* 8.0*  PHOS  --  3.9  --     Liver Function Tests:  Recent Labs Lab 12/18/16 1240  AST 15  ALT <5*  ALKPHOS 86  BILITOT 0.6  PROT 6.7  ALBUMIN 2.8*   No results for input(s): LIPASE, AMYLASE in the last 168 hours. No results for input(s): AMMONIA in the last 168 hours.  CBC:  Recent Labs Lab 12/17/16 2124 12/18/16 1240  WBC 5.8 5.3  NEUTROABS 4.3  --   HGB 10.3* 8.9*  HCT 30.8* 26.6*  MCV 83.9 83.2  PLT 179 162    Cardiac Enzymes: No results for input(s): CKTOTAL, TROPONINI in the last 168 hours.  BNP: Invalid input(s): POCBNP  CBG: No results for input(s): GLUCAP in the last 168 hours.  Microbiology: Recent Results (from the past 720 hour(s))  Surgical pcr screen     Status: None   Collection Time: 12/12/16 11:38 AM  Result Value Ref Range Status   MRSA, PCR NEGATIVE NEGATIVE Final    Staphylococcus aureus NEGATIVE NEGATIVE Final    Comment:        The Xpert SA Assay (FDA approved for NASAL specimens in patients over 60 years of age), is one component of a comprehensive surveillance program.  Test performance has been validated by [facility] for patients greater than or  equal to 47 year old. It is not intended to diagnose infection nor to guide or monitor treatment.   Aerobic/Anaerobic Culture (surgical/deep wound)     Status: None   Collection Time: 12/13/16  1:58 PM  Result Value Ref Range Status   Specimen Description WOUND RIGHT ARM  Final   Special Requests SEROMA POF ANCEF  Final   Gram Stain   Final    RARE WBC PRESENT,BOTH PMN AND MONONUCLEAR NO ORGANISMS SEEN    Culture   Final    No growth aerobically or anaerobically. Performed at [facility], [place] [address], [place], [place] [address]    Report Status 12/18/2016 FINAL  Final  Culture, blood (routine x 2)     Status: None (Preliminary result)   Collection Time: 12/17/16  7:23 PM  Result Value Ref Range Status   Specimen Description BLOOD LEFT FOREARM  Final   Special Requests BOTTLES DRAWN AEROBIC AND ANAEROBIC BCAV  Final   Culture NO GROWTH < 12 HOURS  Final   Report Status PENDING  Incomplete  Culture, blood (routine x 2)     Status: None (Preliminary result)   Collection Time: 12/17/16  7:23 PM  Result Value Ref Range Status   Specimen Description BLOOD LEFT HAND  Final   Special Requests BOTTLES DRAWN AEROBIC AND ANAEROBIC BCAV  Final   Culture NO GROWTH < 12 HOURS  Final   Report Status PENDING  Incomplete     Coagulation Studies: No results for input(s): LABPROT, INR in the last 72 hours.  Urinalysis: No results for input(s): COLORURINE, LABSPEC, PHURINE, GLUCOSEU, HGBUR, BILIRUBINUR, KETONESUR, PROTEINUR, UROBILINOGEN, NITRITE, LEUKOCYTESUR in the last 72 hours.  Invalid input(s): APPERANCEUR      Imaging: Dg Chest 2 View  Result Date: 12/17/2016 CLINICAL DATA:   Acute onset of fever. Patient has a right arm wound vac status post recent surgery. Initial encounter. EXAM: CHEST  2 VIEW COMPARISON:  Chest radiograph performed 11/18/2016 FINDINGS: A small right pleural effusion is again noted. Persistent mild right basilar infiltrate is again noted. The left lung appears relatively clear. No pneumothorax is seen. The heart is normal in size. A left-sided dual-lumen catheter is noted ending about the mid SVC. A right-sided vascular stent is noted. No acute osseous abnormalities are seen. Apparent soft tissue air is noted at the site of the patient's right arm wound vac. Would correlate clinically to exclude infection with a gas producing organism. IMPRESSION: 1. Small right pleural effusion again noted. Persistent mild right basilar airspace opacity seen. Underlying infection cannot be excluded. 2. Apparent soft tissue air noted at the site of the patient's right arm wound vac. Would correlate clinically to exclude infection with a gas producing organism. Electronically Signed   By: Garald Balding M.D.   On: 12/17/2016 20:05   [place] Extrem Up Right Ltd  Result Date: 12/17/2016 CLINICAL DATA:  Swelling under right biceps x4 days beginning the day after surgery (12/13/2016) to remove a seroma/ hematoma from this region. Wound VAC was placed at that time. Now with fever and chills. EXAM: ULTRASOUND RIGHT UPPER EXTREMITY LIMITED TECHNIQUE: Ultrasound examination of the upper extremity soft tissues was performed in the area of clinical concern. COMPARISON:  None FINDINGS: Interpretation is based on hard copies provided. There is an approximately 7.8 x 4.8 x 7.3 cm hypoechoic partially septated complex cystic intramuscular fluid collection within what appears to be the biceps muscle consistent with an abscess. Echogenic foci may represent small foci of  intraluminal gas. No internal vascularity. Subcutaneous edema is seen of the overlying soft tissues. IMPRESSION: Findings consistent  with an intramuscular abscess measuring 7.8 x 4.8 x 7.3 cm with epicenter believed to be within what appears to be the biceps. Electronically Signed   By: Ashley Royalty M.D.   On: 12/17/2016 20:51      Assessment & Plan: Pt is a 51 y.o. yo male with a PMHX of end-stage renal disease, initial doses 1997.  Got kidney transplant deceased donor in 05-13-00 which lasted till October 2016.  He has been on dialysis again since then. Hewas admitted on 12/17/2016 with fever, pain and swelling in his right arm over the surgical site.  1.  End-stage renal disease.  [place].  He went to nephrology.  TTS.  We will arrange for dialysis while patient is in the hospital. Since patient had not dialyzed since last Thursday, we will schedule him for dialysis today.  2.  Anemia of chronic kidney disease Hemoglobin 8.9. We will continue Procrit with dialysis  3.  Secondary hyperparathyroidism We will monitor phosphors during this hospital stay  4.  Right arm pain and swelling.  Recent drainage of the seroma.   Right arm ultrasound suggests repeat fluid collection Patient is scheduled for incision and drainage tomorrow  Repeat evaluation during HD   HEMODIALYSIS FLOWSHEET:  Blood Flow Rate (mL/min): 400 mL/min Arterial Pressure (mmHg): -200 mmHg Venous Pressure (mmHg): 170 mmHg Transmembrane Pressure (mmHg): 20 mmHg Ultrafiltration Rate (mL/min): 160 mL/min Dialysate Flow Rate (mL/min): 600 ml/min Conductivity: Machine : 13.8 Conductivity: Machine : 13.8 Dialysis Fluid Bolus: Normal Saline Bolus Amount (mL): 250 mL Dialysate Change: 2K Intra-Hemodialysis Comments: 503. tx end

## 2016-12-18 NOTE — Progress Notes (Signed)
  End of hd 

## 2016-12-18 NOTE — Anesthesia Preprocedure Evaluation (Deleted)
Anesthesia Evaluation  Patient identified by MRN, date of birth, ID band Patient awake    Reviewed: Allergy & Precautions, NPO status , Patient's Chart, lab work & pertinent test results  Airway Mallampati: II  TM Distance: <3 FB Neck ROM: limited    Dental  (+) Poor Dentition, Chipped   Pulmonary shortness of breath and with exertion, asthma , sleep apnea , pneumonia,           Cardiovascular Exercise Tolerance: Good hypertension, Pt. on medications and Pt. on home beta blockers +CHF       Neuro/Psych negative neurological ROS     GI/Hepatic negative GI ROS, Neg liver ROS,   Endo/Other    Renal/GU ESRF and DialysisRenal disease     Musculoskeletal   Abdominal Normal abdominal exam  (+)   Peds  Hematology  (+) anemia ,   Anesthesia Other Findings Past Medical History: No date: Anemia No date: Asthma     Comment: childhood No date: Blood transfusion without reported diagnosis No date: Chronic kidney disease     Comment: Dialysis Tues, Thurs, Sat No date: Colitis 04/12/16: Diverticulosis     Comment: also seen: sigmoid and rectal erythema, path:  03/2016: Hemorrhoids     Comment: bleeding at 04/12/16 colonoscopy, treated with               APC laser. cauterization No date: Hyperlipidemia No date: Hypertension 03/27/2013: OSA (obstructive sleep apnea) No date: Renal insufficiency   Reproductive/Obstetrics                             Anesthesia Physical  Anesthesia Plan  ASA: IV  Anesthesia Plan: General LMA   Post-op Pain Management:    Induction: Intravenous  Airway Management Planned:   Additional Equipment:   Intra-op Plan:   Post-operative Plan: Extubation in OR  Informed Consent:   Plan Discussed with: CRNA and Surgeon  Anesthesia Plan Comments:         Anesthesia Quick Evaluation

## 2016-12-18 NOTE — ED Notes (Signed)
Report to [place], RN 2C

## 2016-12-18 NOTE — Care Management (Signed)
[facility] B[facility] HD liaison notified of Admission

## 2016-12-18 NOTE — Progress Notes (Signed)
Post hd vitals

## 2016-12-18 NOTE — Progress Notes (Signed)
Pharmacy Antibiotic Note  Jonathon Conley is a 51 y.o. male admitted on 12/17/2016 with sepsis.  Pharmacy has been consulted for Vancomycin/Zosyn dosing.  Plan: Patient received vancomycin 750 mg IV x 1 in the ED  Will give another vancomycin 500 mg for a total of 1.25g (15 mg/kg). Patient is on HD TThSa -- will check a random pre-HD vanc level 3/6 @ 0500. Will re-dose appropriately when level < 20 mg/L. Will initiate Zosyn 3.375g IV q12h per CrCl < 20 ml/min  Height: 5' 10"  (177.8 cm) Weight: 180 lb (81.6 kg) IBW/kg (Calculated) : 73  Temp (24hrs), Avg:99.9 F (37.7 C), Min:98.3 F (36.8 C), Max:102.4 F (39.1 C)   Recent Labs Lab 12/12/16 1138 12/17/16 1923 12/17/16 2124  WBC 4.9  --  5.8  CREATININE 5.76*  --  15.53*  LATICACIDVEN  --  0.7  --     Estimated Creatinine Clearance: 5.9 mL/min (by C-G formula based on SCr of 15.53 mg/dL (H)).    Allergies  Allergen Reactions  . Lisinopril Swelling    Facial swelling    Antimicrobials this admission: 3/4 Vanc >>  3/4 Zosyn >>   Dose adjustments this admission: Zosyn 3.375g q12h per CrCl < 20 ml/min  Microbiology results: 3/4 BCx: sent 3/4 WndCx: NG 2/27 MRSA PCR: NG  Thank you for allowing pharmacy to be a part of this patient's care.  Tobie Lords, PharmD, BCPS Clinical Pharmacist 12/18/2016

## 2016-12-18 NOTE — Consult Note (Signed)
[facility] Consult Note  MRN : [id number]  Jonathon Conley is a 51 y.o. (02-19-1966) male who presents with chief complaint of  Chief Complaint  Patient presents with  . Post-op Problem  .  History of Present Illness: I am asked by Dr. Ether Griffins to evaluate the patient for possible abscess in the right arm.  The patient is known to me and had a dialysis graft placed several weeks ago. Last Wednesday, we drained a large seroma from the right arm after the procedure. This was cultured in the operating room and had no growth on culture. This had the appearance of a sterile seroma at the time of procedure. The fluid has reaccumulated in his arm despite a negative pressure dressing. He presented with a fever of 102 and it was concerned that this could be the source. His arm is not red and no more painful than it had been from the swelling. His PermCath does not have any erythema or drainage and was only placed a few weeks ago. His fever quickly broke and he has not had any further fevers since his initial presentation.  Current Facility-Administered Medications  Medication Dose Route Frequency Provider Last Rate Last Dose  . 0.9 %  sodium chloride infusion  250 mL Intravenous PRN Alexis Hugelmeyer, DO      . acetaminophen (TYLENOL) tablet 650 mg  650 mg Oral Q6H PRN Alexis Hugelmeyer, DO       Or  . acetaminophen (TYLENOL) suppository 650 mg  650 mg Rectal Q6H PRN Alexis Hugelmeyer, DO      . albuterol (PROVENTIL) (2.5 MG/3ML) 0.083% nebulizer solution 2.5 mg  2.5 mg Nebulization Q6H PRN Alexis Hugelmeyer, DO      . amLODipine (NORVASC) tablet 10 mg  10 mg Oral Daily Alexis Hugelmeyer, DO   10 mg at 12/18/16 0947  . aspirin EC tablet 81 mg  81 mg Oral Daily Alexis Hugelmeyer, DO   81 mg at 12/18/16 0948  . bisacodyl (DULCOLAX) EC tablet 5 mg  5 mg Oral Daily PRN Alexis Hugelmeyer, DO      . [START ON 12/19/2016] calcitRIOL (ROCALTROL) capsule 0.5 mcg  0.5 mcg Oral Q  T,Th,Sa-HD Alexis Hugelmeyer, DO      . calcium acetate (PHOSLO) capsule 667 mg  667 mg Oral TID WC Alexis Hugelmeyer, DO   667 mg at 12/18/16 0948  . carvedilol (COREG) tablet 25 mg  25 mg Oral BID Alexis Hugelmeyer, DO   25 mg at 12/18/16 0947  . cinacalcet (SENSIPAR) tablet 30 mg  30 mg Oral Once per day on Mon Wed Fri [facility], DO   30 mg at 12/18/16 9357  . ferrous sulfate tablet 325 mg  325 mg Oral TID WC Alexis Hugelmeyer, DO   325 mg at 12/18/16 0947  . heparin injection 5,000 Units  5,000 Units Subcutaneous Q8H Alexis Hugelmeyer, DO   5,000 Units at 12/18/16 0630  . hydrALAZINE (APRESOLINE) injection 10 mg  10 mg Intravenous Q4H PRN Saundra Shelling, MD   10 mg at 12/18/16 0317  . hydrALAZINE (APRESOLINE) tablet 25 mg  25 mg Oral TID Alexis Hugelmeyer, DO   25 mg at 12/18/16 0948  . ipratropium (ATROVENT) nebulizer solution 0.5 mg  0.5 mg Nebulization Q6H PRN Alexis Hugelmeyer, DO      . magnesium citrate solution 1 Bottle  1 Bottle Oral Once PRN Alexis Hugelmeyer, DO      . Mesalamine (ASACOL) DR capsule 800 mg  800 mg Oral TID Theodoro Grist, MD      . morphine 4 MG/ML injection 4 mg  4 mg Intravenous Q4H PRN Theodoro Grist, MD   4 mg at 12/18/16 0950  . ondansetron (ZOFRAN) tablet 4 mg  4 mg Oral Q6H PRN Alexis Hugelmeyer, DO       Or  . ondansetron (ZOFRAN) injection 4 mg  4 mg Intravenous Q6H PRN Alexis Hugelmeyer, DO      . oxyCODONE (Oxy IR/ROXICODONE) immediate release tablet 10 mg  10 mg Oral Q4H PRN Theodoro Grist, MD      . piperacillin-tazobactam (ZOSYN) IVPB 3.375 g  3.375 g Intravenous Q12H Alexis Hugelmeyer, DO   3.375 g at 12/18/16 0730  . senna-docusate (Senokot-S) tablet 1 tablet  1 tablet Oral QHS PRN Alexis Hugelmeyer, DO      . simvastatin (ZOCOR) tablet 5 mg  5 mg Oral QHS Alexis Hugelmeyer, DO      . sodium chloride flush (NS) 0.9 % injection 3 mL  3 mL Intravenous Q12H Alexis Hugelmeyer, DO   3 mL at 12/18/16 0315  . sodium chloride flush (NS) 0.9 % injection 3  mL  3 mL Intravenous PRN Alexis Hugelmeyer, DO      . zolpidem (AMBIEN) tablet 5 mg  5 mg Oral QHS PRN,MR X 1 Alexis Hugelmeyer, DO        Past Medical History:  Diagnosis Date  . Anemia   . Asthma    childhood  . Blood transfusion without reported diagnosis   . Chronic kidney disease    Dialysis Tues, Thurs, Sat  . Colitis   . Diverticulosis 04/12/16   also seen: sigmoid and rectal erythema, path:   . Hemorrhoids 03/2016   bleeding at 04/12/16 colonoscopy, treated with  APC laser. cauterization  . Hyperlipidemia   . Hypertension   . OSA (obstructive sleep apnea) 03/27/2013  . Renal insufficiency     Past Surgical History:  Procedure Laterality Date  . APPLICATION OF WOUND VAC Right 12/13/2016   Procedure: APPLICATION OF WOUND VAC;  Surgeon: Algernon Huxley, MD;  Location: [facility];  Service: Vascular;  Laterality: Right;  . COLONOSCOPY N/A 04/12/2016   Procedure: COLONOSCOPY;  Surgeon: Mauri Pole, MD;  Location: [facility];  Service: Endoscopy;  Laterality: N/A;  . EXCHANGE OF A DIALYSIS CATHETER  11/20/2016   Procedure: Exchange Of A Dialysis Catheter;  Surgeon: Algernon Huxley, MD;  Location: [place] [facility];  Service: Cardiovascular;;  . fistulagrams Right   . HEMATOMA EVACUATION Right 12/13/2016   Procedure: EVACUATION HEMATOMA;  Surgeon: Algernon Huxley, MD;  Location: [facility];  Service: Vascular;  Laterality: Right;  . HOT HEMOSTASIS N/A 04/12/2016   Procedure: HOT HEMOSTASIS (ARGON PLASMA COAGULATION/BICAP);  Surgeon: Mauri Pole, MD;  Location: [facility];  Service: Endoscopy;  Laterality: N/A;  . KIDNEY TRANSPLANT    . PERIPHERAL VASCULAR CATHETERIZATION Bilateral 10/02/2016   Procedure: Upper Extremity Venography;  Surgeon: Algernon Huxley, MD;  Location: [place] [facility];  Service: Cardiovascular;  Laterality: Bilateral;  . PERIPHERAL VASCULAR CATHETERIZATION Right 11/08/2016   Procedure: DIALYSIS/PERMA CATHETER REMOVAL right Jugular;  Surgeon: Algernon Huxley, MD;   Location: [facility];  Service: Vascular;  Laterality: Right;  . PERIPHERAL VASCULAR CATHETERIZATION Left 11/08/2016   Procedure: DIALYSIS/PERMA CATHETER INSERTION left jugular with u/s guide and flouroscan;  Surgeon: Algernon Huxley, MD;  Location: [facility];  Service: Vascular;  Laterality: Left;  . PERIPHERAL VASCULAR CATHETERIZATION N/A 11/08/2016  Procedure: IVC FILTER INSERTION;  Surgeon: Algernon Huxley, MD;  Location: [facility] ORS;  Service: Vascular;  Laterality: N/A;  . perm a cath in Rt upper chest Right   . THORACENTESIS    . VASCULAR ACCESS DEVICE INSERTION Right 11/08/2016   Procedure: INSERTION OF HERO VASCULAR ACCESS DEVICE ( GRAFT );  Surgeon: Algernon Huxley, MD;  Location: [facility];  Service: Vascular;  Laterality: Right;    Social History Social History  Substance Use Topics  . Smoking status: Never Smoker  . Smokeless tobacco: Never Used  . Alcohol use No  married, lives with wife  Family History Family History  Problem Relation Age of Onset  . Heart disease Mother   . Cancer Mother     ovarian  . Cancer Maternal Grandmother   . Cancer Maternal Grandfather   . Asthma Son   . Asthma Son   no bleeding or clotting disorders  Allergies  Allergen Reactions  . Lisinopril Swelling    Facial swelling     REVIEW OF SYSTEMS (Negative unless checked)  Constitutional: [] Weight loss  [] Fever  [] Chills Cardiac: [] Chest pain   [] Chest pressure   [] Palpitations   [] Shortness of breath when laying flat   [] Shortness of breath at rest   [x] Shortness of breath with exertion. Vascular:  [] Pain in legs with walking   [] Pain in legs at rest   [] Pain in legs when laying flat   [] Claudication   [] Pain in feet when walking  [] Pain in feet at rest  [] Pain in feet when laying flat   [] History of DVT   [] Phlebitis   [x] Swelling in legs   [] Varicose veins   [] Non-healing ulcers Pulmonary:   [] Uses home oxygen   [] Productive cough   [] Hemoptysis   [] Wheeze  [] COPD   [] Asthma Neurologic:  [] Dizziness   [] Blackouts   [] Seizures   [] History of stroke   [] History of TIA  [] Aphasia   [] Temporary blindness   [] Dysphagia   [] Weakness or numbness in arms   [] Weakness or numbness in legs Musculoskeletal:  [] Arthritis   [] Joint swelling   [] Joint pain   [] Low back pain Hematologic:  [] Easy bruising  [] Easy bleeding   [] Hypercoagulable state   [] Anemic  [] Hepatitis Gastrointestinal:  [] Blood in stool   [] Vomiting blood  [] Gastroesophageal reflux/heartburn   [] Difficulty swallowing. Genitourinary:  [x] Chronic kidney disease   [] Difficult urination  [] Frequent urination  [] Burning with urination   [] Blood in urine Skin:  [] Rashes   [] Ulcers   [] Wounds Psychological:  [] History of anxiety   []  History of major depression.  Physical Examination  Vitals:   12/18/16 1328 12/18/16 1405 12/18/16 1430 12/18/16 1500  BP: (!) 158/111 (!) 143/99 140/90 (!) 160/91  Pulse: 82 81 89 88  Resp: 12 13 14 15   Temp:      TempSrc:      SpO2: 100% 100% 100% 100%  Weight:      Height:       Body mass index is 25.53 kg/m. Gen:  WD/WN, NAD Head: [place]/AT, No temporalis wasting.  Ear/Nose/Throat: Hearing grossly intact, nares w/o erythema or drainage, oropharynx w/o Erythema/Exudate Eyes: Sclera non-icteric, conjunctiva clear Neck: Trachea midline.  No JVD.  Pulmonary:  Good air movement, respirations not labored, equal bilaterally.  Cardiac: RRR, normal S1, S2. Vascular: there is a thrill in the graft.  Right Arm has 3+ swelling Vessel Right Left  Radial Palpable Palpable  Gastrointestinal: soft, non-tender/non-distended. No guarding/reflex.  Musculoskeletal: M/S 5/5 throughout.  Extremities without ischemic changes.  No deformity or atrophy.  Neurologic: Sensation grossly intact in extremities.  Symmetrical.  Speech is fluent. Motor exam as listed above. Psychiatric: Judgment intact, Mood & affect appropriate for pt's clinical situation. Dermatologic: No rashes or ulcers  noted.  No cellulitis or open wounds. Lymph : No Cervical, Axillary, or Inguinal lymphadenopathy.      CBC Lab Results  Component Value Date   WBC 5.3 12/18/2016   HGB 8.9 (L) 12/18/2016   HCT 26.6 (L) 12/18/2016   MCV 83.2 12/18/2016   PLT 162 12/18/2016    BMET    Component Value Date/Time   NA 134 (L) 12/18/2016 1240   K 4.5 12/18/2016 1240   CL 96 (L) 12/18/2016 1240   CO2 26 12/18/2016 1240   GLUCOSE 98 12/18/2016 1240   BUN 49 (H) 12/18/2016 1240   CREATININE 16.70 (H) 12/18/2016 1240   CREATININE 10.76 (HH) 02/14/2015 1612   CALCIUM 8.0 (L) 12/18/2016 1240   GFRNONAA 3 (L) 12/18/2016 1240   GFRAA 3 (L) 12/18/2016 1240   Estimated Creatinine Clearance: 5.5 mL/min (by C-G formula based on SCr of 16.7 mg/dL (H)).  COAG Lab Results  Component Value Date   INR 1.04 10/23/2016   INR 1.19 11/03/2015   INR 1.19 02/15/2015    Radiology Dg Chest 2 View  Result Date: 12/17/2016 CLINICAL DATA:  Acute onset of fever. Patient has a right arm wound vac status post recent surgery. Initial encounter. EXAM: CHEST  2 VIEW COMPARISON:  Chest radiograph performed 11/18/2016 FINDINGS: A small right pleural effusion is again noted. Persistent mild right basilar infiltrate is again noted. The left lung appears relatively clear. No pneumothorax is seen. The heart is normal in size. A left-sided dual-lumen catheter is noted ending about the mid SVC. A right-sided vascular stent is noted. No acute osseous abnormalities are seen. Apparent soft tissue air is noted at the site of the patient's right arm wound vac. Would correlate clinically to exclude infection with a gas producing organism. IMPRESSION: 1. Small right pleural effusion again noted. Persistent mild right basilar airspace opacity seen. Underlying infection cannot be excluded. 2. Apparent soft tissue air noted at the site of the patient's right arm wound vac. Would correlate clinically to exclude infection with a gas producing  organism. Electronically Signed   By: Garald Balding M.D.   On: 12/17/2016 20:05   [place] Extrem Up Right Ltd  Result Date: 12/17/2016 CLINICAL DATA:  Swelling under right biceps x4 days beginning the day after surgery (12/13/2016) to remove a seroma/ hematoma from this region. Wound VAC was placed at that time. Now with fever and chills. EXAM: ULTRASOUND RIGHT UPPER EXTREMITY LIMITED TECHNIQUE: Ultrasound examination of the upper extremity soft tissues was performed in the area of clinical concern. COMPARISON:  None FINDINGS: Interpretation is based on hard copies provided. There is an approximately 7.8 x 4.8 x 7.3 cm hypoechoic partially septated complex cystic intramuscular fluid collection within what appears to be the biceps muscle consistent with an abscess. Echogenic foci may represent small foci of intraluminal gas. No internal vascularity. Subcutaneous edema is seen of the overlying soft tissues. IMPRESSION: Findings consistent with an intramuscular abscess measuring 7.8 x 4.8 x 7.3 cm with epicenter believed to be within what appears to be the biceps. Electronically Signed   By: Ashley Royalty M.D.   On: 12/17/2016 20:51      Assessment/Plan 1. Recurrent  seroma right upper extremity. This has recurred despite VAC dressing placement and previous drainage. It was sterile initially and I suspect that this is sterile again. Even though it was sterile, it is symptomatic and I believe a repeat drainage would be appropriate. This would also provide drainage of this is an abscess. He has eaten today so this will be scheduled for tomorrow. Risks and benefits were discussed. 2. End-stage renal disease. Currently using his PermCath until his arm is improved and he can begin using his hero graft. 3. Hypertension. Stable on outpatient medications and blood pressure control important in reducing the progression of atherosclerotic disease. On appropriate oral medications.    Leotis Pain, MD  12/18/2016 3:18  PM    This note was created with Dragon medical transcription system.  Any error is purely unintentional

## 2016-12-18 NOTE — ED Notes (Signed)
Pt transport to 201

## 2016-12-18 NOTE — Progress Notes (Signed)
HD initiated via L Chest HD catheter. Dressing changed. No s/sx of infection. Patient currently has no complaints. Continue to monitor.

## 2016-12-18 NOTE — Progress Notes (Signed)
[place] [place] NAME: Jonathon Conley    MR#:  [id number]  DATE OF BIRTH:  1966-04-23  SUBJECTIVE:  CHIEF COMPLAINT:   Chief Complaint  Patient presents with  . Post-op Problem   The patient is a 51 year old American male with medical history significant with history of end-stage renal disease, on dialysis Tuesdays, Thursdays, Saturdays, anemia, asthma, hypertension, hyperlipidemia, obstructive sleep apnea, who underwent dialysis graft placement a few weeks ago, and 40 developed right upper extremity hematoma which was drained and wound VAC was placed on 12/05/2016 by Dr. Rometta Emery patient presented to the hospital with complaints of right upper extremity swelling, pain, and high fevers to 103. His labs revealed no significant elevation of white blood cell count, influenza test was negative.   ultrasound of right upper extremity revealed an abscess in biceps on the right. The patient was initiated on broad-spectrum antibiotic therapy and admitted to the hospital. Vascular surgery consultation is pending.. Patient is complaining of,  pain, swelling in the right upper extremity, requests pain medications   Review of Systems  Constitutional: Positive for fever and malaise/fatigue. Negative for chills and weight loss.  HENT: Negative for congestion.   Eyes: Negative for blurred vision and double vision.  Respiratory: Negative for cough, sputum production, shortness of breath and wheezing.   Cardiovascular: Negative for chest pain, palpitations, orthopnea, leg swelling and PND.  Gastrointestinal: Negative for abdominal pain, blood in stool, constipation, diarrhea, nausea and vomiting.  Genitourinary: Negative for dysuria, frequency, hematuria and urgency.  Musculoskeletal: Positive for myalgias. Negative for falls.  Neurological: Negative for dizziness, tremors, focal weakness and headaches.  Endo/Heme/Allergies: Does not bruise/bleed easily.   Psychiatric/Behavioral: Negative for depression. The patient does not have insomnia.     VITAL SIGNS: Blood pressure (!) 143/99, pulse 81, temperature 99 F (37.2 C), temperature source Oral, resp. rate 13, height 5' 10"  (1.778 m), weight 80.7 kg (177 lb 14.6 oz), SpO2 100 %.  PHYSICAL EXAMINATION:   GENERAL:  51 y.o.-year-old patient lying in the bed  in moderate distress due to significant pain and the right upper extremity swelling, feeling weak, uncomfortable.  EYES: Pupils equal, round, reactive to light and accommodation. No scleral icterus. Extraocular muscles intact.  HEENT: Head atraumatic, normocephalic. Oropharynx and nasopharynx clear.  Left upper anterior chest hemodialysis catheter NECK:  Supple, no jugular venous distention. No thyroid enlargement, no tenderness.  LUNGS: Normal breath sounds bilaterally, no wheezing, rales,rhonchi or crepitation. No use of accessory muscles of respiration.  CARDIOVASCULAR: S1, S2 normal. No murmurs, rubs, or gallops.  ABDOMEN: Soft, nontender, nondistended. Bowel sounds present. No organomegaly or mass.  EXTREMITIES: No pedal edema, cyanosis, or clubbing.  Right upper extremity is very swollen in biceps area, significant tenderness to palpation, no fluctuation was felt, no significantly increased warmth was noted NEUROLOGIC: Cranial nerves II through XII are intact. Muscle strength 5/5 in all extremities. Sensation intact. Gait not checked.  PSYCHIATRIC: The patient is alert and oriented x 3.  SKIN: No obvious rash, lesion, or ulcer.   ORDERS/RESULTS REVIEWED:   CBC  Recent Labs Lab 12/12/16 1138 12/13/16 1241 12/17/16 2124 12/18/16 1240  WBC 4.9  --  5.8 5.3  HGB 10.9* 11.2* 10.3* 8.9*  HCT 32.0* 33.0* 30.8* 26.6*  PLT 184  --  179 162  MCV 84.4  --  83.9 83.2  MCH 28.8  --  28.2 27.8  MCHC 34.1  --  33.6 33.4  RDW 18.3*  --  17.9* 17.8*  LYMPHSABS  --   --  0.5*  --   MONOABS  --   --  1.0  --   EOSABS  --   --  0.1  --    BASOSABS  --   --  0.0  --    ------------------------------------------------------------------------------------------------------------------  Chemistries   Recent Labs Lab 12/12/16 1138 12/13/16 1225 12/13/16 1241 12/17/16 2124 12/18/16 1233 12/18/16 1240  NA 135  --  138 132* 134* 134*  K 3.8 4.6 4.6 4.6 4.4 4.5  CL 99*  --   --  95* 96* 96*  CO2 27  --   --  24 25 26   GLUCOSE 81  --  87 117* 95 98  BUN 14  --   --  44* 50* 49*  CREATININE 5.76*  --   --  15.53* 16.86* 16.70*  CALCIUM 8.2*  --   --  8.2* 8.0* 8.0*  AST  --   --   --  16  --  15  ALT  --   --   --  5*  --  <5*  ALKPHOS  --   --   --  100  --  86  BILITOT  --   --   --  0.6  --  0.6   ------------------------------------------------------------------------------------------------------------------ estimated creatinine clearance is 5.5 mL/min (by C-G formula based on SCr of 16.7 mg/dL (H)). ------------------------------------------------------------------------------------------------------------------ No results for input(s): TSH, T4TOTAL, T3FREE, THYROIDAB in the last 72 hours.  Invalid input(s): FREET3  Cardiac Enzymes No results for input(s): CKMB, TROPONINI, MYOGLOBIN in the last 168 hours.  Invalid input(s): CK ------------------------------------------------------------------------------------------------------------------ Invalid input(s): POCBNP ---------------------------------------------------------------------------------------------------------------  RADIOLOGY: Dg Chest 2 View  Result Date: 12/17/2016 CLINICAL DATA:  Acute onset of fever. Patient has a right arm wound vac status post recent surgery. Initial encounter. EXAM: CHEST  2 VIEW COMPARISON:  Chest radiograph performed 11/18/2016 FINDINGS: A small right pleural effusion is again noted. Persistent mild right basilar infiltrate is again noted. The left lung appears relatively clear. No pneumothorax is seen. The heart is normal in  size. A left-sided dual-lumen catheter is noted ending about the mid SVC. A right-sided vascular stent is noted. No acute osseous abnormalities are seen. Apparent soft tissue air is noted at the site of the patient's right arm wound vac. Would correlate clinically to exclude infection with a gas producing organism. IMPRESSION: 1. Small right pleural effusion again noted. Persistent mild right basilar airspace opacity seen. Underlying infection cannot be excluded. 2. Apparent soft tissue air noted at the site of the patient's right arm wound vac. Would correlate clinically to exclude infection with a gas producing organism. Electronically Signed   By: Garald Balding M.D.   On: 12/17/2016 20:05   [place] Extrem Up Right Ltd  Result Date: 12/17/2016 CLINICAL DATA:  Swelling under right biceps x4 days beginning the day after surgery (12/13/2016) to remove a seroma/ hematoma from this region. Wound VAC was placed at that time. Now with fever and chills. EXAM: ULTRASOUND RIGHT UPPER EXTREMITY LIMITED TECHNIQUE: Ultrasound examination of the upper extremity soft tissues was performed in the area of clinical concern. COMPARISON:  None FINDINGS: Interpretation is based on hard copies provided. There is an approximately 7.8 x 4.8 x 7.3 cm hypoechoic partially septated complex cystic intramuscular fluid collection within what appears to be the biceps muscle consistent with an abscess. Echogenic foci may represent small foci of intraluminal gas. No internal vascularity. Subcutaneous edema is seen of the  overlying soft tissues. IMPRESSION: Findings consistent with an intramuscular abscess measuring 7.8 x 4.8 x 7.3 cm with epicenter believed to be within what appears to be the biceps. Electronically Signed   By: Ashley Royalty M.D.   On: 12/17/2016 20:51    EKG:  Orders placed or performed during the hospital encounter of 12/17/16  . EKG 12-Lead    ASSESSMENT AND PLAN:  Active Problems:   Abscess #1. Sepsis due to abscess  in the right biceps, continue patient on broad-spectrum antibiotic therapy, wound cultures are going to be taken intraoperatively   #2 right biceps abscess, needs to be drained, vascular surgery consultation is obtained, pending #3. End-stage renal disease, continue dialysis per prior schedule, nephrology consultation is obtained #4. Hyponatremia, seems to be stable #5. Anemia, questionable anemia of chronic disease versus intramuscular bleed?, transfuse patient as needed, following closely   Management plans discussed with the patient, family and they are in agreement.   DRUG ALLERGIES:  Allergies  Allergen Reactions  . Lisinopril Swelling    Facial swelling    CODE STATUS:     Code Status Orders        Start     Ordered   12/18/16 0151  Full code  Continuous     12/18/16 0150    Code Status History    Date Active Date Inactive Code Status Order ID Comments User Context   10/02/2016 10:11 AM 10/02/2016  2:18 PM Full Code [id number]  Algernon Huxley, MD Inpatient   04/08/2016  2:12 PM 04/12/2016  7:55 PM Full Code [id number]  Norman Herrlich, MD ED   12/14/2015 10:23 PM 12/16/2015  2:23 PM Full Code [id number]  Demetrios Loll, MD Inpatient   11/03/2015  2:11 AM 11/04/2015  5:39 PM Full Code [id number]  Lily Kocher, MD Inpatient   09/21/2015  7:30 PM 09/24/2015  4:13 PM Full Code [id number]  Vianne Bulls, MD ED   02/14/2015 11:41 PM 02/17/2015  6:14 PM Full Code [id number]  Lavina Hamman, MD ED      TOTAL TIME TAKING CARE OF THIS PATIENT: 40 minutes.    Theodoro Grist M.D on 12/18/2016 at 2:23 PM  Between 7am to 6pm - Pager - [phone]  After 6pm go to [email or web address] - password [email or web address] [facility]  [facility]  Office  [phone]  CC: Primary care physician; [name], MD

## 2016-12-18 NOTE — Progress Notes (Addendum)
Disregard. Incorrect time for note.

## 2016-12-18 NOTE — Progress Notes (Signed)
Post hd assessment 

## 2016-12-18 NOTE — Evaluation (Signed)
Physical Therapy Evaluation Patient Details Name: Jonathon Conley MRN: [id number] DOB: 11/02/1965 Today's Date: 12/18/2016   History of Present Illness  Pt is a 51 y.o. male with a known history of anemia, asthma, end-stage renal disease on hemodialysis, hypertension, hyperlipidemia, obstructive sleep apnea was in a usual state of health until one week ago. Patient had a large right upper extremity hematoma which was drained and wound VAC placed on 12/13/2016. The hematoma developed after dialysis graft placement. He comes the emergency department tonight complaining of right upper extremity swelling associated with pain and temperature to 102. He denies any change in his wound VAC drainage consistency, color or rate. Patient states that he missed dialysis on Saturday. He has otherwise been well.  Vascualar surgery consult has been placed for possible I&D.     Clinical Impression  Pt presents with mild deficits in gait, balance, and activity tolerance.  Pt reports mild "numbness" in medial aspect of R hand, mostly around his 5th finger put pt's sensation to light touch was grossly intact.  Pt was Ind with bed mobility and transfers and was able to amb 200' with SBA while holding IV pole with no LOB.  Pt c/o mild dizziness that remained unchanged regardless of activity or position that began with recent use of pain medication.  During balance training pt did present with very mild deficits in stability and will benefit from continued PT services while in acute care to address above deficits but will likely not require additional PT services once discharged.         Follow Up Recommendations No PT follow up    Equipment Recommendations  None recommended by PT    Recommendations for Other Services       Precautions / Restrictions Precautions Precautions: None Restrictions Weight Bearing Restrictions: No      Mobility  Bed Mobility Overal bed mobility: Independent                 Transfers Overall transfer level: Independent               General transfer comment: Pt steady on confident with sit-to-stand transfers  Ambulation/Gait Ambulation/Gait assistance: Supervision Ambulation Distance (Feet): 200 Feet Assistive device:  (Pt held IV pole) Gait Pattern/deviations: Decreased step length - right;Decreased step length - left   Gait velocity interpretation: Below normal speed for age/gender General Gait Details: Below normal cadence with gait but steady with IV pole without LOB  Stairs Stairs:  (Deferred)          Wheelchair Mobility    Modified Rankin (Stroke Patients Only)       Balance Overall balance assessment: Needs assistance Sitting-balance support: No upper extremity supported Sitting balance-Leahy Scale: Normal     Standing balance support: No upper extremity supported Standing balance-Leahy Scale: Good Standing balance comment: Static balance training with feet apart, together, and semi-tandem with combinations of eyes open/closed and head still/head turns; SLS training with min instability and Max SLS times per below.   Single Leg Stance - Right Leg: 6 Single Leg Stance - Left Leg: 8                         Pertinent Vitals/Pain Pain Assessment: 0-10 Pain Score: 5  Pain Location: R upper arm Pain Descriptors / Indicators: Aching    Home Living Family/patient expects to be discharged to:: Private residence Living Arrangements: Spouse/significant other Available Help at Discharge: Family;Available 24 hours/day Type of Home:  House Home Access: Stairs to enter Entrance Stairs-Rails: None Entrance Stairs-Number of Steps: 3 Home Layout: One level Home Equipment: Other (comment) (Owns a 3-pt cane that was his mother's)      Prior Function Level of Independence: Independent         Comments: Ind Amb without AD community distances, Ind with ADLs, no fall history     Hand Dominance   Dominant Hand:  Left    Extremity/Trunk Assessment        Lower Extremity Assessment Lower Extremity Assessment: Overall WFL for tasks assessed       Communication   Communication: No difficulties  Cognition Arousal/Alertness: Awake/alert Behavior During Therapy: WFL for tasks assessed/performed Overall Cognitive Status: Within Functional Limits for tasks assessed                      General Comments      Exercises Other Exercises Other Exercises: Static balance training with feet apart, together, and semi-tandem with combinations of eyes open/closed and head still/head turns; SLS training with min instability and Max SLS time of 6 sec on RLE and 8 sec on LLE.   Assessment/Plan    PT Assessment Patient needs continued PT services  PT Problem List Decreased balance;Decreased activity tolerance       PT Treatment Interventions Gait training;Balance training    PT Goals (Current goals can be found in the Care Plan section)  Acute Rehab PT Goals Patient Stated Goal: To return home PT Goal Formulation: With patient Time For Goal Achievement: 12/31/16 Potential to Achieve Goals: Good    Frequency Min 2X/week   Barriers to discharge        Co-evaluation               End of Session Equipment Utilized During Treatment: Gait belt Activity Tolerance: Patient tolerated treatment well Patient left: in bed;with call bell/phone within reach;with SCD's reapplied   PT Visit Diagnosis: Unsteadiness on feet (R26.81)         Time: 1000-1031 PT Time Calculation (min) (ACUTE ONLY): 31 min   Charges:   PT Evaluation $PT Eval Low Complexity: 1 Procedure PT Treatments $Therapeutic Exercise: 8-22 mins   PT G Codes:         DRoyetta Asal PT, DPT 12/18/16, 12:11 PM

## 2016-12-19 ENCOUNTER — Ambulatory Visit: Admit: 2016-12-19 | Payer: Medicare Other | Admitting: Vascular Surgery

## 2016-12-19 ENCOUNTER — Inpatient Hospital Stay: Payer: Medicare Other | Admitting: Anesthesiology

## 2016-12-19 ENCOUNTER — Encounter
Admission: EM | Disposition: A | Discharge status: Home / Self Care | Payer: Self-pay | Source: Home / Self Care | Attending: Internal Medicine

## 2016-12-19 DIAGNOSIS — R6510 Systemic inflammatory response syndrome (SIRS) of non-infectious origin without acute organ dysfunction: Secondary | ICD-10-CM

## 2016-12-19 DIAGNOSIS — D638 Anemia in other chronic diseases classified elsewhere: Secondary | ICD-10-CM

## 2016-12-19 DIAGNOSIS — M7981 Nontraumatic hematoma of soft tissue: Secondary | ICD-10-CM

## 2016-12-19 DIAGNOSIS — N186 End stage renal disease: Secondary | ICD-10-CM

## 2016-12-19 DIAGNOSIS — E871 Hypo-osmolality and hyponatremia: Secondary | ICD-10-CM

## 2016-12-19 HISTORY — PX: HEMATOMA EVACUATION: SHX5118

## 2016-12-19 HISTORY — PX: APPLICATION OF WOUND VAC: SHX5189

## 2016-12-19 LAB — MRSA PCR SCREENING: MRSA BY PCR: NEGATIVE

## 2016-12-19 LAB — HEPATITIS B SURFACE ANTIGEN: HEP B S AG: NEGATIVE

## 2016-12-19 LAB — VANCOMYCIN, RANDOM: Vancomycin Rm: 14

## 2016-12-19 SURGERY — IRRIGATION AND DEBRIDEMENT EXTREMITY
Anesthesia: General | Laterality: Right

## 2016-12-19 SURGERY — EVACUATION HEMATOMA
Anesthesia: General | Laterality: Right

## 2016-12-19 MED ORDER — OXYCODONE HCL 5 MG/5ML PO SOLN: 5.0000 mg | Freq: Once | ORAL | Status: DC | PRN

## 2016-12-19 MED ORDER — FENTANYL CITRATE (PF) 100 MCG/2ML IJ SOLN: 25.0000 ug | INTRAMUSCULAR | Status: DC | PRN

## 2016-12-19 MED ORDER — HYDRALAZINE HCL 50 MG PO TABS
50.0000 mg | ORAL_TABLET | Freq: Three times a day (TID) | ORAL | Status: DC
  Administered 2016-12-19: 50 mg via ORAL
  Filled 2016-12-19: qty 1

## 2016-12-19 MED ORDER — MIDAZOLAM HCL 2 MG/2ML IJ SOLN
INTRAMUSCULAR | Status: AC
  Filled 2016-12-19: qty 2

## 2016-12-19 MED ORDER — PROPOFOL 10 MG/ML IV BOLUS
INTRAVENOUS | Status: AC
  Filled 2016-12-19: qty 20

## 2016-12-19 MED ORDER — VANCOMYCIN HCL IN DEXTROSE 1-5 GM/200ML-% IV SOLN
1000.0000 mg | Freq: Once | INTRAVENOUS | Status: DC
  Filled 2016-12-19: qty 200

## 2016-12-19 MED ORDER — OXYCODONE HCL 5 MG PO TABS: 5.0000 mg | ORAL_TABLET | Freq: Once | ORAL | Status: DC | PRN

## 2016-12-19 MED ORDER — PROPOFOL 10 MG/ML IV BOLUS
INTRAVENOUS | Status: DC | PRN
  Administered 2016-12-19: 150 mg via INTRAVENOUS

## 2016-12-19 MED ORDER — CEFAZOLIN IN D5W 1 GM/50ML IV SOLN
INTRAVENOUS | Status: DC | PRN
  Administered 2016-12-19: 1 g via INTRAVENOUS

## 2016-12-19 MED ORDER — MIDAZOLAM HCL 2 MG/2ML IJ SOLN
INTRAMUSCULAR | Status: DC | PRN
  Administered 2016-12-19: 2 mg via INTRAVENOUS

## 2016-12-19 MED ORDER — ALTEPLASE 2 MG IJ SOLR
4.0000 mg | Freq: Once | INTRAMUSCULAR | Status: AC
  Administered 2016-12-19: 4 mg
  Filled 2016-12-19: qty 4

## 2016-12-19 MED ORDER — PHENYLEPHRINE HCL 10 MG/ML IJ SOLN
INTRAMUSCULAR | Status: DC | PRN
  Administered 2016-12-19: 100 ug via INTRAVENOUS

## 2016-12-19 MED ORDER — DEXAMETHASONE SODIUM PHOSPHATE 10 MG/ML IJ SOLN
INTRAMUSCULAR | Status: DC | PRN
  Administered 2016-12-19: 10 mg via INTRAVENOUS

## 2016-12-19 MED ORDER — CEPHALEXIN 500 MG PO CAPS: 500.0000 mg | ORAL_CAPSULE | Freq: Every day | ORAL | 0 refills | Status: DC

## 2016-12-19 MED ORDER — SODIUM CHLORIDE 0.9 % IV SOLN
INTRAVENOUS | Status: DC | PRN
  Administered 2016-12-19: 07:00:00 via INTRAVENOUS

## 2016-12-19 MED ORDER — VANCOMYCIN HCL IN DEXTROSE 1-5 GM/200ML-% IV SOLN: 1000.0000 mg | INTRAVENOUS | Status: DC

## 2016-12-19 MED ORDER — FENTANYL CITRATE (PF) 100 MCG/2ML IJ SOLN
INTRAMUSCULAR | Status: AC
  Filled 2016-12-19: qty 2

## 2016-12-19 MED ORDER — FENTANYL CITRATE (PF) 100 MCG/2ML IJ SOLN
INTRAMUSCULAR | Status: DC | PRN
  Administered 2016-12-19: 50 ug via INTRAVENOUS

## 2016-12-19 SURGICAL SUPPLY — 22 items
AVANCE WOUND VAC PAD ×2 IMPLANT
BRUSH SCRUB EZ 1% IODOPHOR (MISCELLANEOUS) ×2 IMPLANT
CANISTER SUCT 1200ML W/VALVE (MISCELLANEOUS) ×3 IMPLANT
DRAPE INCISE IOBAN 66X45 STRL (DRAPES) ×1 IMPLANT
DRAPE LAPAROTOMY T 102X78X121 (DRAPES) ×3 IMPLANT
DRAPE SHEET LG 3/4 BI-LAMINATE (DRAPES) ×2 IMPLANT
ELECT REM PT RETURN 9FT ADLT (ELECTROSURGICAL) ×3
ELECTRODE REM PT RTRN 9FT ADLT (ELECTROSURGICAL) ×1 IMPLANT
GLOVE BIO SURGEON STRL SZ7 (GLOVE) ×7 IMPLANT
GLOVE BIOGEL PI IND STRL 7.5 (GLOVE) IMPLANT
GLOVE BIOGEL PI INDICATOR 7.5 (GLOVE) ×4
GOWN STRL REUS W/ TWL LRG LVL3 (GOWN DISPOSABLE) ×1 IMPLANT
GOWN STRL REUS W/ TWL XL LVL3 (GOWN DISPOSABLE) ×1 IMPLANT
GOWN STRL REUS W/TWL LRG LVL3 (GOWN DISPOSABLE) ×6
GOWN STRL REUS W/TWL XL LVL3 (GOWN DISPOSABLE) ×3
KIT RM TURNOVER STRD PROC AR (KITS) ×3 IMPLANT
NS IRRIG 1000ML POUR BTL (IV SOLUTION) ×1 IMPLANT
NS IRRIG 500ML POUR BTL (IV SOLUTION) ×2 IMPLANT
PACK BASIN MINOR ARMC (MISCELLANEOUS) ×3 IMPLANT
SOL PREP PVP 2OZ (MISCELLANEOUS) ×3
SOLUTION PREP PVP 2OZ (MISCELLANEOUS) ×1 IMPLANT
SPONGE LAP 18X18 5 PK (GAUZE/BANDAGES/DRESSINGS) ×3 IMPLANT

## 2016-12-19 NOTE — Progress Notes (Signed)
Subjective:   Patient underwent irrigation and drainage of right arm seroma with negative pressure dressing placement this morning Feels well after the surgery Denies any acute complaints  Objective:  Vital signs in last 24 hours:  Temp:  [97 F (36.1 C)-99.8 F (37.7 C)] (P) 97.9 F (36.6 C) (03/06 1545) Pulse Rate:  [87-103] (P) 94 (03/06 1545) Resp:  [8-20] (P) 14 (03/06 1545) BP: (136-185)/(80-110) (P) 156/99 (03/06 1545) SpO2:  [92 %-100 %] (P) 100 % (03/06 1545) Weight:  [82.5 kg (181 lb 14.4 oz)] 82.5 kg (181 lb 14.4 oz) (03/05 1720)  Weight change: -0.947 kg (-2 lb 1.4 oz) Filed Weights   12/17/16 1841 12/18/16 1230 12/18/16 1720  Weight: 81.6 kg (180 lb) 80.7 kg (177 lb 14.6 oz) 82.5 kg (181 lb 14.4 oz)    Intake/Output:    Intake/Output Summary (Last 24 hours) at 12/19/16 1612 Last data filed at 12/19/16 0850  Gross per 24 hour  Intake              478 ml  Output              -91 ml  Net              569 ml     Physical Exam: General: Laying in the bed, no acute distress   HEENT Moist oral mucous membranes   Neck Supple   Pulm/lungs Normal breathing effort, clear to auscultation   CVS/Heart No rub or gallop   Abdomen:  soft, nontender   Extremities: No edema   Neurologic: Alert, oriented   Skin: Normal turgor, warm, dry   Access: Left IJ PermCath,    Right arm wound VAC in place     Basic Metabolic Panel:   Recent Labs Lab 12/13/16 1225 12/13/16 1241 12/17/16 2124 12/18/16 1233 12/18/16 1240  NA  --  138 132* 134* 134*  K 4.6 4.6 4.6 4.4 4.5  CL  --   --  95* 96* 96*  CO2  --   --  24 25 26   GLUCOSE  --  87 117* 95 98  BUN  --   --  44* 50* 49*  CREATININE  --   --  15.53* 16.86* 16.70*  CALCIUM  --   --  8.2* 8.0* 8.0*  PHOS  --   --   --  3.9  --      CBC:  Recent Labs Lab 12/13/16 1241 12/17/16 2124 12/18/16 1240  WBC  --  5.8 5.3  NEUTROABS  --  4.3  --   HGB 11.2* 10.3* 8.9*  HCT 33.0* 30.8* 26.6*  MCV  --  83.9 83.2   PLT  --  179 162      Microbiology:  Recent Results (from the past 720 hour(s))  Surgical pcr screen     Status: None   Collection Time: 12/12/16 11:38 AM  Result Value Ref Range Status   MRSA, PCR NEGATIVE NEGATIVE Final   Staphylococcus aureus NEGATIVE NEGATIVE Final    Comment:        The Xpert SA Assay (FDA approved for NASAL specimens in patients over 33 years of age), is one component of a comprehensive surveillance program.  Test performance has been validated by [facility] for patients greater than or equal to 68 year old. It is not intended to diagnose infection nor to guide or monitor treatment.   Aerobic/Anaerobic Culture (surgical/deep wound)     Status: None   Collection  Time: 12/13/16  1:58 PM  Result Value Ref Range Status   Specimen Description WOUND RIGHT ARM  Final   Special Requests SEROMA POF ANCEF  Final   Gram Stain   Final    RARE WBC PRESENT,BOTH PMN AND MONONUCLEAR NO ORGANISMS SEEN    Culture   Final    No growth aerobically or anaerobically. Performed at [facility], [place] [address], [place], [place] [address]    Report Status 12/18/2016 FINAL  Final  Culture, blood (routine x 2)     Status: None (Preliminary result)   Collection Time: 12/17/16  7:23 PM  Result Value Ref Range Status   Specimen Description BLOOD LEFT FOREARM  Final   Special Requests BOTTLES DRAWN AEROBIC AND ANAEROBIC BCAV  Final   Culture NO GROWTH 2 DAYS  Final   Report Status PENDING  Incomplete  Culture, blood (routine x 2)     Status: None (Preliminary result)   Collection Time: 12/17/16  7:23 PM  Result Value Ref Range Status   Specimen Description BLOOD LEFT HAND  Final   Special Requests BOTTLES DRAWN AEROBIC AND ANAEROBIC BCAV  Final   Culture NO GROWTH 2 DAYS  Final   Report Status PENDING  Incomplete  MRSA PCR Screening     Status: None   Collection Time: 12/19/16  1:09 AM  Result Value Ref Range Status   MRSA by PCR NEGATIVE NEGATIVE Final     Comment:        The GeneXpert MRSA Assay (FDA approved for NASAL specimens only), is one component of a comprehensive MRSA colonization surveillance program. It is not intended to diagnose MRSA infection nor to guide or monitor treatment for MRSA infections.     Coagulation Studies: No results for input(s): LABPROT, INR in the last 72 hours.  Urinalysis: No results for input(s): COLORURINE, LABSPEC, PHURINE, GLUCOSEU, HGBUR, BILIRUBINUR, KETONESUR, PROTEINUR, UROBILINOGEN, NITRITE, LEUKOCYTESUR in the last 72 hours.  Invalid input(s): APPERANCEUR    Imaging: Dg Chest 2 View  Result Date: 12/17/2016 CLINICAL DATA:  Acute onset of fever. Patient has a right arm wound vac status post recent surgery. Initial encounter. EXAM: CHEST  2 VIEW COMPARISON:  Chest radiograph performed 11/18/2016 FINDINGS: A small right pleural effusion is again noted. Persistent mild right basilar infiltrate is again noted. The left lung appears relatively clear. No pneumothorax is seen. The heart is normal in size. A left-sided dual-lumen catheter is noted ending about the mid SVC. A right-sided vascular stent is noted. No acute osseous abnormalities are seen. Apparent soft tissue air is noted at the site of the patient's right arm wound vac. Would correlate clinically to exclude infection with a gas producing organism. IMPRESSION: 1. Small right pleural effusion again noted. Persistent mild right basilar airspace opacity seen. Underlying infection cannot be excluded. 2. Apparent soft tissue air noted at the site of the patient's right arm wound vac. Would correlate clinically to exclude infection with a gas producing organism. Electronically Signed   By: Garald Balding M.D.   On: 12/17/2016 20:05   [place] Extrem Up Right Ltd  Result Date: 12/17/2016 CLINICAL DATA:  Swelling under right biceps x4 days beginning the day after surgery (12/13/2016) to remove a seroma/ hematoma from this region. Wound VAC was placed at that  time. Now with fever and chills. EXAM: ULTRASOUND RIGHT UPPER EXTREMITY LIMITED TECHNIQUE: Ultrasound examination of the upper extremity soft tissues was performed in the area of clinical concern. COMPARISON:  None FINDINGS:  Interpretation is based on hard copies provided. There is an approximately 7.8 x 4.8 x 7.3 cm hypoechoic partially septated complex cystic intramuscular fluid collection within what appears to be the biceps muscle consistent with an abscess. Echogenic foci may represent small foci of intraluminal gas. No internal vascularity. Subcutaneous edema is seen of the overlying soft tissues. IMPRESSION: Findings consistent with an intramuscular abscess measuring 7.8 x 4.8 x 7.3 cm with epicenter believed to be within what appears to be the biceps. Electronically Signed   By: Ashley Royalty M.D.   On: 12/17/2016 20:51     Medications:    . alteplase  4 mg Intracatheter Once  . amLODipine  10 mg Oral Daily  . aspirin EC  81 mg Oral Daily  . calcitRIOL  0.5 mcg Oral Q T,Th,Sa-HD  . calcium acetate  667 mg Oral TID WC  . carvedilol  25 mg Oral BID  . cinacalcet  30 mg Oral Once per day on Mon Wed Fri  . ferrous sulfate  325 mg Oral TID WC  . heparin  5,000 Units Subcutaneous Q8H  . hydrALAZINE  50 mg Oral TID  . Mesalamine  800 mg Oral TID  . piperacillin-tazobactam (ZOSYN)  IV  3.375 g Intravenous Q12H  . simvastatin  5 mg Oral QHS  . sodium chloride flush  3 mL Intravenous Q12H  . [START ON 12/21/2016] vancomycin  1,000 mg Intravenous Q T,Th,Sa-HD   sodium chloride, acetaminophen **OR** acetaminophen, albuterol, bisacodyl, hydrALAZINE, ipratropium, magnesium citrate, morphine injection, ondansetron **OR** ondansetron (ZOFRAN) IV, oxyCODONE, senna-docusate, sodium chloride flush, zolpidem  Assessment/ Plan:  51 y.o. African American male with a PMHX of end-stage renal disease, initial doses 1997.  Got kidney transplant deceased donor in May 28, 2000 which lasted till October 2016.  He has  been on dialysis again since then. Hewas admitted on 12/17/2016 with fever, pain and swelling in his right arm over the surgical site.  1.  End-stage renal disease.  [place] nephrology.  TTS.  Patient underwent dialysis treatment yesterday Arrange for dialysis today as it is his regular day  2.  Anemia of chronic kidney disease Hemoglobin 8.9.  3.  Secondary hyperparathyroidism We will monitor phosphors during this hospital stay  4.  Right arm pain and swelling.  Recent drainage of the seroma.   Right arm incision and drainage done earlier today  PS: Notified by nurse that patient's PermCath is not working. Asked her to pack it with TPA. Dialyze in the morning. Patient is scheduled to go home tonight. He has refused to stay till the morning for repeat dialysis. Discharge coordination team/patient [facility] is arranging for dialysis tomorrow morning at his outpatient dialysis clinic   LOS: 2 Keelin Sheridan 3/6/20184:12 PM

## 2016-12-19 NOTE — H&P (Signed)
[facility] History & Physical Update  The patient was interviewed and re-examined.  The patient's previous History and Physical has been reviewed and is unchanged.  There is no change in the plan of care. We plan to proceed with the scheduled procedure.  Leotis Pain, MD  12/19/2016, 7:47 AM

## 2016-12-19 NOTE — Progress Notes (Signed)
HD catheter not functioning. Candiss Norse MD aware. TPA ordered and overnight dwell.

## 2016-12-19 NOTE — Op Note (Addendum)
  OPERATIVE NOTE   PROCEDURE: 1. Irrigation and drainage of right arm seroma with negative pressure dressing placement  PRE-OPERATIVE DIAGNOSIS: Hematoma/seroma of right arm after previous dialysis graft placement and previous seroma evacuation                POST-OPERATIVE DIAGNOSIS: Same as above  SURGEON: Leotis Pain, MD  ASSISTANT(S): None  ANESTHESIA: general  ESTIMATED BLOOD LOSS: 5 cc  FINDING(S): Clear serous fluid evacuated  SPECIMEN(S):  none  INDICATIONS:   Jonathon Conley is a 51 y.o. male who presents with prominent swelling and an ultrasound showing complex fluid in his right upper arm after previous dialysis graft placement. This is quite large and symptomatic. This has recurred despite previous drainage. He is brought in for evacuation of this collection and negative pressure dressing. Risks and benefits are discussed.  DESCRIPTION: After obtaining full informed written consent, the patient was brought back to the operating room and placed supine upon the operating table.  The patient received IV antibiotics prior to induction.  After obtaining adequate anesthesia, the patient was prepped and draped in the standard fashion. I elected to reenter the previous incision where the arm could be entered away from the graft to avoid contamination of the PTFE graft. There was clearly a fluid-filled sac consistent with a seroma that was identified. This was entered sharply and a large amount of clear straw-colored fluid was evacuated. Some where between 50-75 cc of fluid were evacuated although this was difficult to quantify due to the amount of fluid lost onto the field. There was no purulent material. There was slight blood tinge from the recent surgery. I then probed the wound and there was no active bleeding. The graft continued to have flow. I elected not to close the deep tissues on this occasion hoping that this would keep the fluid from accumulating again.  A negative  pressure dressing was then placed overlying the wound to continue to evacuate the fluid. The wound was about 4-5 cm long, about 2 cm wide, and had at least 3-4 cm in depth. The patient was then awakened from anesthesia and taken to the recovery room in stable condition having tolerated the procedure well.  COMPLICATIONS: none  CONDITION: stable  Leotis Pain  12/13/2016, 2:08 PM   This note was created with Dragon Medical transcription system. Any errors in dictation are purely unintentional.

## 2016-12-19 NOTE — Care Management (Addendum)
Plan for patient to discharge with resumption of home health care RN orders for vac changes 3 times a week. Malachy Mood with [facility] notified of discharge plan. Elvera Bicker notified of discharge. RNCM signing off

## 2016-12-19 NOTE — Transfer of Care (Signed)
Immediate Anesthesia Transfer of Care Note  Patient: Jonathon Conley  Procedure(s) Performed: Procedure(s): EVACUATION HEMATOMA (Right) APPLICATION OF WOUND VAC (Right)  Patient Location: PACU  Anesthesia Type:General  Level of Consciousness: sedated  Airway & Oxygen Therapy: Patient Spontanous Breathing and Patient connected to face mask oxygen  Post-op Assessment: Report given to RN and Post -op Vital signs reviewed and stable  Post vital signs: Reviewed and stable  Last Vitals:  Vitals:   12/19/16 0458 12/19/16 0816  BP: (!) 157/80 (!) 141/91  Pulse: 96 90  Resp: 20 12  Temp: 37.7 C     Last Pain:  Vitals:   12/19/16 0458  TempSrc: Oral  PainSc:       Patients Stated Pain Goal: 1 (83/25/49 8264)  Complications: No apparent anesthesia complications

## 2016-12-19 NOTE — Progress Notes (Signed)
Pharmacy Antibiotic Note  Jonathon Conley is a 51 y.o. male admitted on 12/17/2016 with sepsis.  Pharmacy has been consulted for Vancomycin/Zosyn dosing.  Plan: Patient received vancomycin 750 mg IV x 1 in the ED  Will give another vancomycin 500 mg for a total of 1.25g (15 mg/kg). Patient is on HD TThSa -- will check a random pre-HD vanc level 3/6 @ 0500. Will re-dose appropriately when level < 20 mg/L. Will initiate Zosyn 3.375g IV q12h per CrCl < 20 ml/min  Height: 5' 10"  (177.8 cm) Weight: 181 lb 14.4 oz (82.5 kg) IBW/kg (Calculated) : 73  Temp (24hrs), Avg:98.9 F (37.2 C), Min:98.1 F (36.7 C), Max:99.8 F (37.7 C)   Recent Labs Lab 12/12/16 1138 12/17/16 1923 12/17/16 2124 12/18/16 1233 12/18/16 1240 12/19/16 0538  WBC 4.9  --  5.8  --  5.3  --   CREATININE 5.76*  --  15.53* 16.86* 16.70*  --   LATICACIDVEN  --  0.7  --   --   --   --   VANCORANDOM  --   --   --   --   --  14    Estimated Creatinine Clearance: 5.5 mL/min (by C-G formula based on SCr of 16.7 mg/dL (H)).    Allergies  Allergen Reactions  . Lisinopril Swelling    Facial swelling    Antimicrobials this admission: 3/4 Vanc >>  3/4 Zosyn >>   Dose adjustments this admission: Zosyn 3.375g q12h per CrCl < 20 ml/min  3/6 AM vanc level 14. 1 gram x1 ordered.   Microbiology results: 3/4 BCx: sent 3/4 WndCx: NG 2/27 MRSA PCR: NG  Thank you for allowing pharmacy to be a part of this patient's care.  Tobie Lords, PharmD, BCPS Clinical Pharmacist 12/19/2016

## 2016-12-19 NOTE — Progress Notes (Signed)
Start of hd

## 2016-12-19 NOTE — Progress Notes (Signed)
Pre tx info

## 2016-12-19 NOTE — Anesthesia Preprocedure Evaluation (Signed)
Anesthesia Evaluation  Patient identified by MRN, date of birth, ID band Patient awake    Reviewed: Allergy & Precautions, NPO status , Patient's Chart, lab work & pertinent test results  Airway Mallampati: II  TM Distance: <3 FB Neck ROM: limited    Dental  (+) Poor Dentition, Chipped   Pulmonary shortness of breath and with exertion, asthma , sleep apnea , pneumonia,           Cardiovascular Exercise Tolerance: Good hypertension, Pt. on medications and Pt. on home beta blockers +CHF       Neuro/Psych negative neurological ROS     GI/Hepatic negative GI ROS, Neg liver ROS,   Endo/Other    Renal/GU ESRF and DialysisRenal disease     Musculoskeletal   Abdominal Normal abdominal exam  (+)   Peds  Hematology  (+) anemia ,   Anesthesia Other Findings Past Medical History: No date: Anemia No date: Asthma     Comment: childhood No date: Blood transfusion without reported diagnosis No date: Chronic kidney disease     Comment: Dialysis Tues, Thurs, Sat No date: Colitis 04/12/16: Diverticulosis     Comment: also seen: sigmoid and rectal erythema, path:  03/2016: Hemorrhoids     Comment: bleeding at 04/12/16 colonoscopy, treated with               APC laser. cauterization No date: Hyperlipidemia No date: Hypertension 03/27/2013: OSA (obstructive sleep apnea) No date: Renal insufficiency   Reproductive/Obstetrics                             Anesthesia Physical  Anesthesia Plan  ASA: IV  Anesthesia Plan: General LMA   Post-op Pain Management:    Induction: Intravenous  Airway Management Planned:   Additional Equipment:   Intra-op Plan:   Post-operative Plan: Extubation in OR  Informed Consent:   Plan Discussed with: CRNA and Surgeon  Anesthesia Plan Comments:         Anesthesia Quick Evaluation

## 2016-12-19 NOTE — Anesthesia Postprocedure Evaluation (Signed)
Anesthesia Post Note  Patient: Jonathon Conley  Procedure(s) Performed: Procedure(s) (LRB): EVACUATIONof seroma (Right) APPLICATION OF WOUND VAC (Right)  Patient location during evaluation: PACU Anesthesia Type: General Level of consciousness: awake and alert Pain management: pain level controlled Vital Signs Assessment: post-procedure vital signs reviewed and stable Respiratory status: spontaneous breathing, nonlabored ventilation, respiratory function stable and patient connected to nasal cannula oxygen Cardiovascular status: blood pressure returned to baseline and stable Postop Assessment: no signs of nausea or vomiting Anesthetic complications: no     Last Vitals:  Vitals:   12/19/16 0919 12/19/16 1217  BP: (!) 153/87 (!) 185/110  Pulse: 91 90  Resp: 18 18  Temp: 36.8 C 36.8 C    Last Pain:  Vitals:   12/19/16 1217  TempSrc: Oral  PainSc:                  Precious Haws Piscitello

## 2016-12-19 NOTE — Anesthesia Post-op Follow-up Note (Signed)
Anesthesia QCDR form completed.        

## 2016-12-19 NOTE — Discharge Summary (Signed)
[facility] NAME: Jonathon Conley    MR#:  [id number]  DATE OF BIRTH:  04/29/1966  DATE OF ADMISSION:  12/17/2016 ADMITTING PHYSICIAN: Ubaldo Glassing Hugelmeyer, DO  DATE OF DISCHARGE: No discharge date for patient encounter.  PRIMARY CARE PHYSICIAN: [name], MD     ADMISSION DIAGNOSIS:  Swelling of arm [M79.89] Postoperative infection, initial encounter [T81.4XXA]  DISCHARGE DIAGNOSIS:  Active Problems:   Postprocedural seroma of a musculoskeletal structure following other procedure   SIRS (systemic inflammatory response syndrome) (HCC)   Hyponatremia   ESRD (end stage renal disease) (HCC)   Anemia of chronic disease   SECONDARY DIAGNOSIS:   Past Medical History:  Diagnosis Date  . Anemia   . Asthma    childhood  . Blood transfusion without reported diagnosis   . Chronic kidney disease    Dialysis Tues, Thurs, Sat  . Colitis   . Diverticulosis 04/12/16   also seen: sigmoid and rectal erythema, path:   . Hemorrhoids 03/2016   bleeding at 04/12/16 colonoscopy, treated with  APC laser. cauterization  . Hyperlipidemia   . Hypertension   . OSA (obstructive sleep apnea) 03/27/2013  . Renal insufficiency     .pro HOSPITAL COURSE:   The patient is a 51 year old American male with medical history significant with history of end-stage renal disease, on dialysis Tuesdays, Thursdays, Saturdays, anemia, asthma, hypertension, hyperlipidemia, obstructive sleep apnea, who underwent dialysis graft placement a few weeks ago, and 40 developed right upper extremity hematoma which was drained and wound VAC was placed on 12/05/2016 by Dr. Rometta Emery patient presented to the hospital with complaints of right upper extremity swelling, pain, and high fevers to 103. His labs revealed no significant elevation of white blood cell count, influenza test was negative.   ultrasound of right upper extremity revealed an abscess in biceps on the  right. The patient was initiated on broad-spectrum antibiotic therapy and admitted to the hospital. Vascular surgery , dilated patient entered him to operating room, the patient underwent irrigation and drainage of right arm seroma was negative pressure dressing placement. Dr. Lucky Cowboy felt that patient's seroma/hematoma may not be infected, however, recommended to continue antibiotic therapy for 7 days and then reassess cultures. Patient was initiated on broad-spectrum antibiotic therapy. Initially on arrival to the hospital, however, it was downgraded to Keflex 500 mg once daily dose upon discharge. Patient was advised to follow-up with Dr. dew for further recommendations within 1 week after discharge.  Discussion by problem: #1. SIRS due to hematoma/seroma in the right biceps, status post irrigation and drainage 12/19/2016, continue patient on Keflex orally, wound cultures were taken intraoperatively him up pending, blood cultures were negative for 2 days, MRSA PCR was negative.  #2 right biceps seroma, status post drainage by vascular surgery . Cultures are pending, continue Keflex 500 mg daily dose, follow-up with vascular surgeon for further recommendations #3. End-stage renal disease, status post hemodialysis on the fifth and sixth of March 2018, appreciate nephrology input #4. Hyponatremia, stable #5. Anemia of chronic disease , blood count is stable, patient receives Procrit with dialysis  DISCHARGE CONDITIONS:   Stable  CONSULTS OBTAINED:  Treatment Team:  Murlean Iba, MD Algernon Huxley, MD  DRUG ALLERGIES:   Allergies  Allergen Reactions  . Lisinopril Swelling    Facial swelling    DISCHARGE MEDICATIONS:   Current Discharge Medication List    START taking these medications   Details  cephALEXin (KEFLEX) 500 MG capsule  Take 1 capsule (500 mg total) by mouth daily. Qty: 7 capsule, Refills: 0      CONTINUE these medications which have NOT CHANGED   Details  amLODipine (NORVASC)  10 MG tablet Take 10 mg by mouth daily.    aspirin EC 81 MG tablet Take 81 mg by mouth daily.     calcitRIOL (ROCALTROL) 0.5 MCG capsule Take 1 capsule (0.5 mcg total) by mouth Every Tuesday,Thursday,and Saturday with dialysis. Qty: 15 capsule, Refills: 0    calcium acetate (PHOSLO) 667 MG capsule Take 667 mg by mouth 3 (three) times daily with meals.    carvedilol (COREG) 25 MG tablet Take 25 mg by mouth 2 (two) times daily.  Refills: 0    cinacalcet (SENSIPAR) 30 MG tablet Take 30 mg by mouth 3 (three) times a week. Tue, Thurs, Sat after dialysis    ferrous sulfate 325 (65 FE) MG tablet Take 325 mg by mouth 3 (three) times daily with meals. Reported on 04/09/2016    hydrALAZINE (APRESOLINE) 50 MG tablet Take 25 mg by mouth 3 (three) times daily. Reported on 04/09/2016    Mesalamine 800 MG TBEC Take 1 tablet (800 mg total) by mouth 3 (three) times daily. Qty: 90 tablet, Refills: 6    oxyCODONE-acetaminophen (PERCOCET) 7.5-325 MG tablet Take 1 tablet by mouth every 4 (four) hours as needed for severe pain. Qty: 30 tablet, Refills: 0    simvastatin (ZOCOR) 5 MG tablet Take 1 tablet (5 mg total) by mouth at bedtime. Qty: 30 tablet, Refills: 0         DISCHARGE INSTRUCTIONS:    The patient is to follow-up with primary care physician and vascular surgeon within one week after discharge, hemodialysis per schedule  If you experience worsening of your admission symptoms, develop shortness of breath, life threatening emergency, suicidal or homicidal thoughts you must seek medical attention immediately by calling 911 or calling your MD immediately  if symptoms less severe.  You Must read complete instructions/literature along with all the possible adverse reactions/side effects for all the Medicines you take and that have been prescribed to you. Take any new Medicines after you have completely understood and accept all the possible adverse reactions/side effects.   Please note  You  were cared for by a hospitalist during your hospital stay. If you have any questions about your discharge medications or the care you received while you were in the hospital after you are discharged, you can call the unit and asked to speak with the hospitalist on call if the hospitalist that took care of you is not available. Once you are discharged, your primary care physician will handle any further medical issues. Please note that NO REFILLS for any discharge medications will be authorized once you are discharged, as it is imperative that you return to your primary care physician (or establish a relationship with a primary care physician if you do not have one) for your aftercare needs so that they can reassess your need for medications and monitor your lab values.    Today   CHIEF COMPLAINT:   Chief Complaint  Patient presents with  . Post-op Problem    HISTORY OF PRESENT ILLNESS:  Jonathon Conley  is a 51 y.o. male with a known history of end-stage renal disease, on dialysis Tuesdays, Thursdays, Saturdays, anemia, asthma, hypertension, hyperlipidemia, obstructive sleep apnea, who underwent dialysis graft placement a few weeks ago, and 40 developed right upper extremity hematoma which was drained and wound VAC was  placed on 12/05/2016 by Dr. Rometta Emery patient presented to the hospital with complaints of right upper extremity swelling, pain, and high fevers to 103. His labs revealed no significant elevation of white blood cell count, influenza test was negative.   ultrasound of right upper extremity revealed an abscess in biceps on the right. The patient was initiated on broad-spectrum antibiotic therapy and admitted to the hospital. Vascular surgery , dilated patient entered him to operating room, the patient underwent irrigation and drainage of right arm seroma was negative pressure dressing placement. Dr. Lucky Cowboy felt that patient's seroma/hematoma may not be infected, however, recommended to continue  antibiotic therapy for 7 days and then reassess cultures. Patient was initiated on broad-spectrum antibiotic therapy. Initially on arrival to the hospital, however, it was downgraded to Keflex 500 mg once daily dose upon discharge. Patient was advised to follow-up with Dr. dew for further recommendations within 1 week after discharge.  Discussion by problem: #1. SIRS due to hematoma/seroma in the right biceps, status post irrigation and drainage 12/19/2016, continue patient on Keflex orally, wound cultures were taken intraoperatively him up pending, blood cultures were negative for 2 days, MRSA PCR was negative.  #2 right biceps seroma, status post drainage by vascular surgery . Cultures are pending, continue Keflex 500 mg daily dose, follow-up with vascular surgeon for further recommendations #3. End-stage renal disease, status post hemodialysis on the fifth and sixth of March 2018, appreciate nephrology input #4. Hyponatremia, stable #5. Anemia of chronic disease , blood count is stable, patient receives Procrit with dialysis    VITAL SIGNS:  Blood pressure (!) 185/110, pulse 90, temperature 98.3 F (36.8 C), temperature source Oral, resp. rate 18, height 5' 10"  (1.778 m), weight 82.5 kg (181 lb 14.4 oz), SpO2 98 %.  I/O:   Intake/Output Summary (Last 24 hours) at 12/19/16 1446 Last data filed at 12/19/16 0850  Gross per 24 hour  Intake              478 ml  Output              -91 ml  Net              569 ml    PHYSICAL EXAMINATION:  GENERAL:  51 y.o.-year-old patient lying in the bed with no acute distress.  EYES: Pupils equal, round, reactive to light and accommodation. No scleral icterus. Extraocular muscles intact.  HEENT: Head atraumatic, normocephalic. Oropharynx and nasopharynx clear. Permanent hemodialysis catheter in left upper chest NECK:  Supple, no jugular venous distention. No thyroid enlargement, no tenderness.  LUNGS: Normal breath sounds bilaterally, no wheezing,  rales,rhonchi or crepitation. No use of accessory muscles of respiration.  CARDIOVASCULAR: S1, S2 normal. No murmurs, rubs, or gallops.  ABDOMEN: Soft, non-tender, non-distended. Bowel sounds present. No organomegaly or mass.  EXTREMITIES: No pedal edema, cyanosis, or clubbing. Right upper extremity is swollen, wound VAC is noted draining bloody liquid NEUROLOGIC: Cranial nerves II through XII are intact. Muscle strength 5/5 in all extremities. Sensation intact. Gait not checked.  PSYCHIATRIC: The patient is alert and oriented x 3.  SKIN: No obvious rash, lesion, or ulcer.   DATA REVIEW:   CBC  Recent Labs Lab 12/18/16 1240  WBC 5.3  HGB 8.9*  HCT 26.6*  PLT 162    Chemistries   Recent Labs Lab 12/18/16 1240  NA 134*  K 4.5  CL 96*  CO2 26  GLUCOSE 98  BUN 49*  CREATININE 16.70*  CALCIUM 8.0*  AST  15  ALT <5*  ALKPHOS 86  BILITOT 0.6    Cardiac Enzymes No results for input(s): TROPONINI in the last 168 hours.  Microbiology Results  Results for orders placed or performed during the hospital encounter of 12/17/16  Culture, blood (routine x 2)     Status: None (Preliminary result)   Collection Time: 12/17/16  7:23 PM  Result Value Ref Range Status   Specimen Description BLOOD LEFT FOREARM  Final   Special Requests BOTTLES DRAWN AEROBIC AND ANAEROBIC BCAV  Final   Culture NO GROWTH 2 DAYS  Final   Report Status PENDING  Incomplete  Culture, blood (routine x 2)     Status: None (Preliminary result)   Collection Time: 12/17/16  7:23 PM  Result Value Ref Range Status   Specimen Description BLOOD LEFT HAND  Final   Special Requests BOTTLES DRAWN AEROBIC AND ANAEROBIC BCAV  Final   Culture NO GROWTH 2 DAYS  Final   Report Status PENDING  Incomplete  MRSA PCR Screening     Status: None   Collection Time: 12/19/16  1:09 AM  Result Value Ref Range Status   MRSA by PCR NEGATIVE NEGATIVE Final    Comment:        The GeneXpert MRSA Assay (FDA approved for NASAL  specimens only), is one component of a comprehensive MRSA colonization surveillance program. It is not intended to diagnose MRSA infection nor to guide or monitor treatment for MRSA infections.     RADIOLOGY:  Dg Chest 2 View  Result Date: 12/17/2016 CLINICAL DATA:  Acute onset of fever. Patient has a right arm wound vac status post recent surgery. Initial encounter. EXAM: CHEST  2 VIEW COMPARISON:  Chest radiograph performed 11/18/2016 FINDINGS: A small right pleural effusion is again noted. Persistent mild right basilar infiltrate is again noted. The left lung appears relatively clear. No pneumothorax is seen. The heart is normal in size. A left-sided dual-lumen catheter is noted ending about the mid SVC. A right-sided vascular stent is noted. No acute osseous abnormalities are seen. Apparent soft tissue air is noted at the site of the patient's right arm wound vac. Would correlate clinically to exclude infection with a gas producing organism. IMPRESSION: 1. Small right pleural effusion again noted. Persistent mild right basilar airspace opacity seen. Underlying infection cannot be excluded. 2. Apparent soft tissue air noted at the site of the patient's right arm wound vac. Would correlate clinically to exclude infection with a gas producing organism. Electronically Signed   By: Garald Balding M.D.   On: 12/17/2016 20:05   [place] Extrem Up Right Ltd  Result Date: 12/17/2016 CLINICAL DATA:  Swelling under right biceps x4 days beginning the day after surgery (12/13/2016) to remove a seroma/ hematoma from this region. Wound VAC was placed at that time. Now with fever and chills. EXAM: ULTRASOUND RIGHT UPPER EXTREMITY LIMITED TECHNIQUE: Ultrasound examination of the upper extremity soft tissues was performed in the area of clinical concern. COMPARISON:  None FINDINGS: Interpretation is based on hard copies provided. There is an approximately 7.8 x 4.8 x 7.3 cm hypoechoic partially septated complex cystic  intramuscular fluid collection within what appears to be the biceps muscle consistent with an abscess. Echogenic foci may represent small foci of intraluminal gas. No internal vascularity. Subcutaneous edema is seen of the overlying soft tissues. IMPRESSION: Findings consistent with an intramuscular abscess measuring 7.8 x 4.8 x 7.3 cm with epicenter believed to be within what appears to be the biceps. Electronically  Signed   By: Ashley Royalty M.D.   On: 12/17/2016 20:51    EKG:   Orders placed or performed during the hospital encounter of 12/17/16  . EKG 12-Lead      Management plans discussed with the patient, family and they are in agreement.  CODE STATUS:     Code Status Orders        Start     Ordered   12/18/16 0151  Full code  Continuous     12/18/16 0150    Code Status History    Date Active Date Inactive Code Status Order ID Comments User Context   10/02/2016 10:11 AM 10/02/2016  2:18 PM Full Code [id number]  Algernon Huxley, MD Inpatient   04/08/2016  2:12 PM 04/12/2016  7:55 PM Full Code [id number]  Norman Herrlich, MD ED   12/14/2015 10:23 PM 12/16/2015  2:23 PM Full Code [id number]  Demetrios Loll, MD Inpatient   11/03/2015  2:11 AM 11/04/2015  5:39 PM Full Code [id number]  Lily Kocher, MD Inpatient   09/21/2015  7:30 PM 09/24/2015  4:13 PM Full Code [id number]  Vianne Bulls, MD ED   02/14/2015 11:41 PM 02/17/2015  6:14 PM Full Code [id number]  Lavina Hamman, MD ED      TOTAL TIME TAKING CARE OF THIS PATIENT: 40 minutes.    Theodoro Grist M.D on 12/19/2016 at 2:46 PM  Between 7am to 6pm - Pager - [phone]  After 6pm go to [email or web address] - password [email or web address] [facility]  [facility]  Office  [phone]  CC: Primary care physician; [name], MD

## 2016-12-19 NOTE — Progress Notes (Signed)
Pt catheter not functioning. TPA dwell overnight. Pt has scheduled outpatient HD appointment 12/20/16 at 1240p. Candiss Norse MD aware.

## 2016-12-19 NOTE — Anesthesia Procedure Notes (Signed)
Procedure Name: LMA Insertion Date/Time: 12/19/2016 7:42 AM Performed by: Andria Frames Pre-anesthesia Checklist: Patient identified, Patient being monitored, Timeout performed, Emergency Drugs available and Suction available Patient Re-evaluated:Patient Re-evaluated prior to inductionOxygen Delivery Method: Circle system utilized Preoxygenation: Pre-oxygenation with 100% oxygen Intubation Type: IV induction Ventilation: Mask ventilation without difficulty LMA: LMA inserted LMA Size: 4.5 Tube type: Oral Number of attempts: 1 Placement Confirmation: positive ETCO2 and breath sounds checked- equal and bilateral Tube secured with: Tape Dental Injury: Teeth and Oropharynx as per pre-operative assessment

## 2016-12-19 NOTE — Progress Notes (Signed)
Patient discharged to home, patient is going to receive dialysis tomorrow. Patient instructed to weigh daily and if gaining 2 or more pounds to notify doctor of weight gain. Patient given morning blood pressure medication. Patient denies pain at this time. Follow up appointment given as ordered.

## 2016-12-20 ENCOUNTER — Encounter: Payer: Self-pay | Admitting: Vascular Surgery

## 2016-12-20 ENCOUNTER — Encounter (INDEPENDENT_AMBULATORY_CARE_PROVIDER_SITE_OTHER): Payer: Medicare Other | Admitting: Vascular Surgery

## 2016-12-20 DIAGNOSIS — N2581 Secondary hyperparathyroidism of renal origin: Secondary | ICD-10-CM | POA: Diagnosis not present

## 2016-12-20 DIAGNOSIS — D631 Anemia in chronic kidney disease: Secondary | ICD-10-CM | POA: Diagnosis not present

## 2016-12-20 DIAGNOSIS — N186 End stage renal disease: Secondary | ICD-10-CM | POA: Diagnosis not present

## 2016-12-20 DIAGNOSIS — D509 Iron deficiency anemia, unspecified: Secondary | ICD-10-CM | POA: Diagnosis not present

## 2016-12-20 LAB — HIV ANTIBODY (ROUTINE TESTING W REFLEX): HIV SCREEN 4TH GENERATION: NONREACTIVE

## 2016-12-21 DIAGNOSIS — I120 Hypertensive chronic kidney disease with stage 5 chronic kidney disease or end stage renal disease: Secondary | ICD-10-CM | POA: Diagnosis not present

## 2016-12-21 DIAGNOSIS — N186 End stage renal disease: Secondary | ICD-10-CM | POA: Diagnosis not present

## 2016-12-21 DIAGNOSIS — N2581 Secondary hyperparathyroidism of renal origin: Secondary | ICD-10-CM | POA: Diagnosis not present

## 2016-12-21 DIAGNOSIS — D631 Anemia in chronic kidney disease: Secondary | ICD-10-CM | POA: Diagnosis not present

## 2016-12-21 DIAGNOSIS — D509 Iron deficiency anemia, unspecified: Secondary | ICD-10-CM | POA: Diagnosis not present

## 2016-12-21 DIAGNOSIS — E785 Hyperlipidemia, unspecified: Secondary | ICD-10-CM | POA: Diagnosis not present

## 2016-12-21 DIAGNOSIS — T82897A Other specified complication of cardiac prosthetic devices, implants and grafts, initial encounter: Secondary | ICD-10-CM | POA: Diagnosis not present

## 2016-12-21 DIAGNOSIS — S40021A Contusion of right upper arm, initial encounter: Secondary | ICD-10-CM | POA: Diagnosis not present

## 2016-12-22 ENCOUNTER — Ambulatory Visit (INDEPENDENT_AMBULATORY_CARE_PROVIDER_SITE_OTHER): Payer: Medicare Other | Admitting: Vascular Surgery

## 2016-12-22 DIAGNOSIS — T82897A Other specified complication of cardiac prosthetic devices, implants and grafts, initial encounter: Secondary | ICD-10-CM | POA: Diagnosis not present

## 2016-12-22 DIAGNOSIS — I120 Hypertensive chronic kidney disease with stage 5 chronic kidney disease or end stage renal disease: Secondary | ICD-10-CM | POA: Diagnosis not present

## 2016-12-22 DIAGNOSIS — E785 Hyperlipidemia, unspecified: Secondary | ICD-10-CM | POA: Diagnosis not present

## 2016-12-22 DIAGNOSIS — S40021A Contusion of right upper arm, initial encounter: Secondary | ICD-10-CM | POA: Diagnosis not present

## 2016-12-22 DIAGNOSIS — N186 End stage renal disease: Secondary | ICD-10-CM | POA: Diagnosis not present

## 2016-12-22 DIAGNOSIS — D631 Anemia in chronic kidney disease: Secondary | ICD-10-CM | POA: Diagnosis not present

## 2016-12-22 LAB — CULTURE, BLOOD (ROUTINE X 2)
CULTURE: NO GROWTH
CULTURE: NO GROWTH

## 2016-12-25 ENCOUNTER — Encounter (INDEPENDENT_AMBULATORY_CARE_PROVIDER_SITE_OTHER): Payer: Medicare Other | Admitting: Vascular Surgery

## 2016-12-25 ENCOUNTER — Encounter (INDEPENDENT_AMBULATORY_CARE_PROVIDER_SITE_OTHER): Payer: Medicare Other

## 2016-12-25 DIAGNOSIS — E785 Hyperlipidemia, unspecified: Secondary | ICD-10-CM | POA: Diagnosis not present

## 2016-12-25 DIAGNOSIS — N186 End stage renal disease: Secondary | ICD-10-CM | POA: Diagnosis not present

## 2016-12-25 DIAGNOSIS — I120 Hypertensive chronic kidney disease with stage 5 chronic kidney disease or end stage renal disease: Secondary | ICD-10-CM | POA: Diagnosis not present

## 2016-12-25 DIAGNOSIS — T82897A Other specified complication of cardiac prosthetic devices, implants and grafts, initial encounter: Secondary | ICD-10-CM | POA: Diagnosis not present

## 2016-12-25 DIAGNOSIS — D631 Anemia in chronic kidney disease: Secondary | ICD-10-CM | POA: Diagnosis not present

## 2016-12-25 DIAGNOSIS — S40021A Contusion of right upper arm, initial encounter: Secondary | ICD-10-CM | POA: Diagnosis not present

## 2016-12-26 DIAGNOSIS — N2581 Secondary hyperparathyroidism of renal origin: Secondary | ICD-10-CM | POA: Diagnosis not present

## 2016-12-26 DIAGNOSIS — D631 Anemia in chronic kidney disease: Secondary | ICD-10-CM | POA: Diagnosis not present

## 2016-12-26 DIAGNOSIS — D509 Iron deficiency anemia, unspecified: Secondary | ICD-10-CM | POA: Diagnosis not present

## 2016-12-26 DIAGNOSIS — N186 End stage renal disease: Secondary | ICD-10-CM | POA: Diagnosis not present

## 2016-12-27 DIAGNOSIS — I120 Hypertensive chronic kidney disease with stage 5 chronic kidney disease or end stage renal disease: Secondary | ICD-10-CM | POA: Diagnosis not present

## 2016-12-27 DIAGNOSIS — N186 End stage renal disease: Secondary | ICD-10-CM | POA: Diagnosis not present

## 2016-12-27 DIAGNOSIS — S40021A Contusion of right upper arm, initial encounter: Secondary | ICD-10-CM | POA: Diagnosis not present

## 2016-12-27 DIAGNOSIS — T82897A Other specified complication of cardiac prosthetic devices, implants and grafts, initial encounter: Secondary | ICD-10-CM | POA: Diagnosis not present

## 2016-12-27 DIAGNOSIS — E785 Hyperlipidemia, unspecified: Secondary | ICD-10-CM | POA: Diagnosis not present

## 2016-12-27 DIAGNOSIS — D631 Anemia in chronic kidney disease: Secondary | ICD-10-CM | POA: Diagnosis not present

## 2016-12-28 DIAGNOSIS — D509 Iron deficiency anemia, unspecified: Secondary | ICD-10-CM | POA: Diagnosis not present

## 2016-12-28 DIAGNOSIS — D631 Anemia in chronic kidney disease: Secondary | ICD-10-CM | POA: Diagnosis not present

## 2016-12-28 DIAGNOSIS — N186 End stage renal disease: Secondary | ICD-10-CM | POA: Diagnosis not present

## 2016-12-28 DIAGNOSIS — N2581 Secondary hyperparathyroidism of renal origin: Secondary | ICD-10-CM | POA: Diagnosis not present

## 2016-12-29 DIAGNOSIS — D631 Anemia in chronic kidney disease: Secondary | ICD-10-CM | POA: Diagnosis not present

## 2016-12-29 DIAGNOSIS — S40021A Contusion of right upper arm, initial encounter: Secondary | ICD-10-CM | POA: Diagnosis not present

## 2016-12-29 DIAGNOSIS — I120 Hypertensive chronic kidney disease with stage 5 chronic kidney disease or end stage renal disease: Secondary | ICD-10-CM | POA: Diagnosis not present

## 2016-12-29 DIAGNOSIS — E785 Hyperlipidemia, unspecified: Secondary | ICD-10-CM | POA: Diagnosis not present

## 2016-12-29 DIAGNOSIS — N186 End stage renal disease: Secondary | ICD-10-CM | POA: Diagnosis not present

## 2016-12-29 DIAGNOSIS — T82897A Other specified complication of cardiac prosthetic devices, implants and grafts, initial encounter: Secondary | ICD-10-CM | POA: Diagnosis not present

## 2016-12-30 DIAGNOSIS — D631 Anemia in chronic kidney disease: Secondary | ICD-10-CM | POA: Diagnosis not present

## 2016-12-30 DIAGNOSIS — N2581 Secondary hyperparathyroidism of renal origin: Secondary | ICD-10-CM | POA: Diagnosis not present

## 2016-12-30 DIAGNOSIS — N186 End stage renal disease: Secondary | ICD-10-CM | POA: Diagnosis not present

## 2016-12-30 DIAGNOSIS — D509 Iron deficiency anemia, unspecified: Secondary | ICD-10-CM | POA: Diagnosis not present

## 2017-01-01 DIAGNOSIS — T82897A Other specified complication of cardiac prosthetic devices, implants and grafts, initial encounter: Secondary | ICD-10-CM | POA: Diagnosis not present

## 2017-01-01 DIAGNOSIS — N186 End stage renal disease: Secondary | ICD-10-CM | POA: Diagnosis not present

## 2017-01-01 DIAGNOSIS — D631 Anemia in chronic kidney disease: Secondary | ICD-10-CM | POA: Diagnosis not present

## 2017-01-01 DIAGNOSIS — I120 Hypertensive chronic kidney disease with stage 5 chronic kidney disease or end stage renal disease: Secondary | ICD-10-CM | POA: Diagnosis not present

## 2017-01-01 DIAGNOSIS — S40021A Contusion of right upper arm, initial encounter: Secondary | ICD-10-CM | POA: Diagnosis not present

## 2017-01-01 DIAGNOSIS — E785 Hyperlipidemia, unspecified: Secondary | ICD-10-CM | POA: Diagnosis not present

## 2017-01-02 ENCOUNTER — Encounter (INDEPENDENT_AMBULATORY_CARE_PROVIDER_SITE_OTHER): Payer: Medicare Other | Admitting: Vascular Surgery

## 2017-01-02 DIAGNOSIS — N186 End stage renal disease: Secondary | ICD-10-CM | POA: Diagnosis not present

## 2017-01-02 DIAGNOSIS — D509 Iron deficiency anemia, unspecified: Secondary | ICD-10-CM | POA: Diagnosis not present

## 2017-01-02 DIAGNOSIS — D631 Anemia in chronic kidney disease: Secondary | ICD-10-CM | POA: Diagnosis not present

## 2017-01-02 DIAGNOSIS — N2581 Secondary hyperparathyroidism of renal origin: Secondary | ICD-10-CM | POA: Diagnosis not present

## 2017-01-03 ENCOUNTER — Encounter (INDEPENDENT_AMBULATORY_CARE_PROVIDER_SITE_OTHER): Payer: Self-pay | Admitting: Vascular Surgery

## 2017-01-03 ENCOUNTER — Ambulatory Visit (INDEPENDENT_AMBULATORY_CARE_PROVIDER_SITE_OTHER): Payer: Medicare Other | Admitting: Vascular Surgery

## 2017-01-03 VITALS — BP 158/96 | HR 89 | Resp 17 | Wt 181.0 lb

## 2017-01-03 DIAGNOSIS — N186 End stage renal disease: Secondary | ICD-10-CM

## 2017-01-03 DIAGNOSIS — Z992 Dependence on renal dialysis: Secondary | ICD-10-CM

## 2017-01-03 DIAGNOSIS — E785 Hyperlipidemia, unspecified: Secondary | ICD-10-CM

## 2017-01-04 DIAGNOSIS — N186 End stage renal disease: Secondary | ICD-10-CM | POA: Diagnosis not present

## 2017-01-04 DIAGNOSIS — N2581 Secondary hyperparathyroidism of renal origin: Secondary | ICD-10-CM | POA: Diagnosis not present

## 2017-01-04 DIAGNOSIS — D509 Iron deficiency anemia, unspecified: Secondary | ICD-10-CM | POA: Diagnosis not present

## 2017-01-04 DIAGNOSIS — D631 Anemia in chronic kidney disease: Secondary | ICD-10-CM | POA: Diagnosis not present

## 2017-01-05 DIAGNOSIS — T82897A Other specified complication of cardiac prosthetic devices, implants and grafts, initial encounter: Secondary | ICD-10-CM | POA: Diagnosis not present

## 2017-01-05 DIAGNOSIS — S40021A Contusion of right upper arm, initial encounter: Secondary | ICD-10-CM | POA: Diagnosis not present

## 2017-01-05 DIAGNOSIS — I120 Hypertensive chronic kidney disease with stage 5 chronic kidney disease or end stage renal disease: Secondary | ICD-10-CM | POA: Diagnosis not present

## 2017-01-05 DIAGNOSIS — N186 End stage renal disease: Secondary | ICD-10-CM | POA: Diagnosis not present

## 2017-01-05 DIAGNOSIS — E785 Hyperlipidemia, unspecified: Secondary | ICD-10-CM | POA: Diagnosis not present

## 2017-01-05 DIAGNOSIS — D631 Anemia in chronic kidney disease: Secondary | ICD-10-CM | POA: Diagnosis not present

## 2017-01-06 DIAGNOSIS — N186 End stage renal disease: Secondary | ICD-10-CM | POA: Diagnosis not present

## 2017-01-06 DIAGNOSIS — N2581 Secondary hyperparathyroidism of renal origin: Secondary | ICD-10-CM | POA: Diagnosis not present

## 2017-01-06 DIAGNOSIS — D631 Anemia in chronic kidney disease: Secondary | ICD-10-CM | POA: Diagnosis not present

## 2017-01-06 DIAGNOSIS — D509 Iron deficiency anemia, unspecified: Secondary | ICD-10-CM | POA: Diagnosis not present

## 2017-01-08 DIAGNOSIS — E785 Hyperlipidemia, unspecified: Secondary | ICD-10-CM | POA: Diagnosis not present

## 2017-01-08 DIAGNOSIS — T82897A Other specified complication of cardiac prosthetic devices, implants and grafts, initial encounter: Secondary | ICD-10-CM | POA: Diagnosis not present

## 2017-01-08 DIAGNOSIS — I120 Hypertensive chronic kidney disease with stage 5 chronic kidney disease or end stage renal disease: Secondary | ICD-10-CM | POA: Diagnosis not present

## 2017-01-08 DIAGNOSIS — D631 Anemia in chronic kidney disease: Secondary | ICD-10-CM | POA: Diagnosis not present

## 2017-01-08 DIAGNOSIS — N186 End stage renal disease: Secondary | ICD-10-CM | POA: Diagnosis not present

## 2017-01-08 DIAGNOSIS — S40021A Contusion of right upper arm, initial encounter: Secondary | ICD-10-CM | POA: Diagnosis not present

## 2017-01-09 DIAGNOSIS — N2581 Secondary hyperparathyroidism of renal origin: Secondary | ICD-10-CM | POA: Diagnosis not present

## 2017-01-09 DIAGNOSIS — N186 End stage renal disease: Secondary | ICD-10-CM | POA: Diagnosis not present

## 2017-01-09 DIAGNOSIS — D509 Iron deficiency anemia, unspecified: Secondary | ICD-10-CM | POA: Diagnosis not present

## 2017-01-09 DIAGNOSIS — D631 Anemia in chronic kidney disease: Secondary | ICD-10-CM | POA: Diagnosis not present

## 2017-01-10 ENCOUNTER — Telehealth (INDEPENDENT_AMBULATORY_CARE_PROVIDER_SITE_OTHER): Payer: Self-pay | Admitting: Vascular Surgery

## 2017-01-10 DIAGNOSIS — I120 Hypertensive chronic kidney disease with stage 5 chronic kidney disease or end stage renal disease: Secondary | ICD-10-CM | POA: Diagnosis not present

## 2017-01-10 DIAGNOSIS — T82897A Other specified complication of cardiac prosthetic devices, implants and grafts, initial encounter: Secondary | ICD-10-CM | POA: Diagnosis not present

## 2017-01-10 DIAGNOSIS — S40021A Contusion of right upper arm, initial encounter: Secondary | ICD-10-CM | POA: Diagnosis not present

## 2017-01-10 DIAGNOSIS — N186 End stage renal disease: Secondary | ICD-10-CM | POA: Diagnosis not present

## 2017-01-10 DIAGNOSIS — E785 Hyperlipidemia, unspecified: Secondary | ICD-10-CM | POA: Diagnosis not present

## 2017-01-10 DIAGNOSIS — D631 Anemia in chronic kidney disease: Secondary | ICD-10-CM | POA: Diagnosis not present

## 2017-01-10 NOTE — Telephone Encounter (Signed)
Patient called and stated that he has a wound vac that his home health nurse said that it was 99% closed since last Monday. He said that she needs an order to take it off. He wants to know if he needs to come in first. [phone]

## 2017-01-10 NOTE — Telephone Encounter (Signed)
Last time I saw him - it didn't seen to be 99% closed - I saw him last week. I hate to make him come in again. Can he or his nurse email [place] a picture?

## 2017-01-10 NOTE — Progress Notes (Signed)
Subjective:    Patient ID: Jonathon Conley, male    DOB: [birth year], 51 y.o.   MRN: [id number] Chief Complaint  Patient presents with  . Follow-up   The patient presents s/p a hematoma evacuation on 12/13/16. His wound has been VAC'd and he is receiving home nursing services for wound care and VAC changes. He presents today stating improvement in his right upper extremity wound. He has a permcath for dialysis. His right upper extremity access does not have a thrill or a bruit. His dialysis center is aware however the patient states he was told by his dialysis center to wait until his wound was healed before any intervention on his access. Denies any fever, nausea or vomiting.    Review of Systems  Constitutional: Negative.   HENT: Negative.   Eyes: Negative.   Respiratory: Negative.   Cardiovascular: Negative.   Gastrointestinal: Negative.   Endocrine: Negative.   Genitourinary:       ESRD  Musculoskeletal: Negative.   Skin: Positive for wound.  Allergic/Immunologic: Negative.   Neurological: Negative.   Hematological: Negative.   Psychiatric/Behavioral: Negative.       Objective:   Physical Exam  Constitutional: He is oriented to person, place, and time. He appears well-developed and well-nourished. No distress.  HENT:  Head: Normocephalic and atraumatic.  Eyes: Conjunctivae are normal. Pupils are equal, round, and reactive to light.  Neck: Normal range of motion.  Cardiovascular: Normal rate, regular rhythm and normal heart sounds.   Pulses:      Radial pulses are 2+ on the right side, and 2+ on the left side.  Right Upper Extremity Access: No bruit and no thrill.   Pulmonary/Chest: Effort normal.  Musculoskeletal: Normal range of motion. He exhibits no edema.  Neurological: He is alert and oriented to person, place, and time.  Skin: He is not diaphoretic.  Right Upper Extremity Wound: With healthy granulation tissue. 2cm x 2cm. No infection noted. No cellulitis noted.     Psychiatric: He has a normal mood and affect. His behavior is normal. Judgment and thought content normal.   BP (!) 158/96   Pulse 89   Resp 17   Wt 181 lb (82.1 kg)   BMI 25.97 kg/m   Past Medical History:  Diagnosis Date  . Anemia   . Asthma    childhood  . Blood transfusion without reported diagnosis   . Chronic kidney disease    Dialysis Tues, Thurs, Sat  . Colitis   . Diverticulosis 04/12/16   also seen: sigmoid and rectal erythema, path:   . Hemorrhoids 03/2016   bleeding at 04/12/16 colonoscopy, treated with  APC laser. cauterization  . Hyperlipidemia   . Hypertension   . OSA (obstructive sleep apnea) 03/27/2013  . Renal insufficiency    Social History   Social History  . Marital status: Married    Spouse name: N/A  . Number of children: N/A  . Years of education: N/A   Occupational History  . Not on file.   Social History Main Topics  . Smoking status: Never Smoker  . Smokeless tobacco: Never Used  . Alcohol use No  . Drug use: No  . Sexual activity: Not on file   Other Topics Concern  . Not on file   Social History Narrative  . No narrative on file   Past Surgical History:  Procedure Laterality Date  . APPLICATION OF WOUND VAC Right 12/13/2016   Procedure: APPLICATION OF WOUND VAC;  Surgeon:  Algernon Huxley, MD;  Location: [facility];  Service: Vascular;  Laterality: Right;  . APPLICATION OF WOUND VAC Right 12/19/2016   Procedure: APPLICATION OF WOUND VAC;  Surgeon: Algernon Huxley, MD;  Location: [facility];  Service: Vascular;  Laterality: Right;  . COLONOSCOPY N/A 04/12/2016   Procedure: COLONOSCOPY;  Surgeon: Mauri Pole, MD;  Location: [place] [facility];  Service: Endoscopy;  Laterality: N/A;  . EXCHANGE OF A DIALYSIS CATHETER  11/20/2016   Procedure: Exchange Of A Dialysis Catheter;  Surgeon: Algernon Huxley, MD;  Location: [place] [facility];  Service: Cardiovascular;;  . fistulagrams Right   . HEMATOMA EVACUATION Right 12/13/2016   Procedure: EVACUATION  HEMATOMA;  Surgeon: Algernon Huxley, MD;  Location: [facility];  Service: Vascular;  Laterality: Right;  . HEMATOMA EVACUATION Right 12/19/2016   Procedure: EVACUATIONof seroma;  Surgeon: Algernon Huxley, MD;  Location: [facility];  Service: Vascular;  Laterality: Right;  . HOT HEMOSTASIS N/A 04/12/2016   Procedure: HOT HEMOSTASIS (ARGON PLASMA COAGULATION/BICAP);  Surgeon: Mauri Pole, MD;  Location: [facility];  Service: Endoscopy;  Laterality: N/A;  . KIDNEY TRANSPLANT    . PERIPHERAL VASCULAR CATHETERIZATION Bilateral 10/02/2016   Procedure: Upper Extremity Venography;  Surgeon: Algernon Huxley, MD;  Location: [place] [facility];  Service: Cardiovascular;  Laterality: Bilateral;  . PERIPHERAL VASCULAR CATHETERIZATION Right 11/08/2016   Procedure: DIALYSIS/PERMA CATHETER REMOVAL right Jugular;  Surgeon: Algernon Huxley, MD;  Location: [facility];  Service: Vascular;  Laterality: Right;  . PERIPHERAL VASCULAR CATHETERIZATION Left 11/08/2016   Procedure: DIALYSIS/PERMA CATHETER INSERTION left jugular with u/s guide and flouroscan;  Surgeon: Algernon Huxley, MD;  Location: [facility];  Service: Vascular;  Laterality: Left;  . PERIPHERAL VASCULAR CATHETERIZATION N/A 11/08/2016   Procedure: IVC FILTER INSERTION;  Surgeon: Algernon Huxley, MD;  Location: [facility];  Service: Vascular;  Laterality: N/A;  . perm a cath in Rt upper chest Right   . THORACENTESIS    . VASCULAR ACCESS DEVICE INSERTION Right 11/08/2016   Procedure: INSERTION OF HERO VASCULAR ACCESS DEVICE ( GRAFT );  Surgeon: Algernon Huxley, MD;  Location: [facility];  Service: Vascular;  Laterality: Right;   Family History  Problem Relation Age of Onset  . Heart disease Mother   . Cancer Mother     ovarian  . Cancer Maternal Grandmother   . Cancer Maternal Grandfather   . Asthma Son   . Asthma Son    Allergies  Allergen Reactions  . Lisinopril Swelling    Facial swelling      Assessment & Plan:  The patient presents s/p a hematoma evacuation on 12/13/16.  His wound has been VAC'd and he is receiving home nursing services for wound care and VAC changes. He presents today stating improvement in his right upper extremity wound. He has a permcath for dialysis. His right upper extremity access does not have a thrill or a bruit. His dialysis center is aware however the patient states he was told by his dialysis center to wait until his wound was healed before any intervention on his access. Denies any fever, nausea or vomiting.   1. ESRD on dialysis ([place]) - Stable Patient is dialysis through permcath. Access with no bruit and thrill. Recommend Fistulogram in an effort to restore function to access. Procedure, risks and benefits explained to patient.  All question answered. Patent wishes to proceed.   2. Right Upper Extremity Hematoma - Improvement Wound is healing well. Continue  VAC for now.  3. Hyperlipidemia, unspecified hyperlipidemia type - Stable Encouraged good control as its slows the progression of atherosclerotic disease.  Current Outpatient Prescriptions on File Prior to Visit  Medication Sig Dispense Refill  . amLODipine (NORVASC) 10 MG tablet Take 10 mg by mouth daily.    Marland Kitchen aspirin EC 81 MG tablet Take 81 mg by mouth daily.     . calcitRIOL (ROCALTROL) 0.5 MCG capsule Take 1 capsule (0.5 mcg total) by mouth Every Tuesday,Thursday,and Saturday with dialysis. 15 capsule 0  . calcium acetate (PHOSLO) 667 MG capsule Take 667 mg by mouth 3 (three) times daily with meals.    . carvedilol (COREG) 25 MG tablet Take 25 mg by mouth 2 (two) times daily.   0  . cephALEXin (KEFLEX) 500 MG capsule Take 1 capsule (500 mg total) by mouth daily. 7 capsule 0  . cinacalcet (SENSIPAR) 30 MG tablet Take 30 mg by mouth 3 (three) times a week. Tue, Thurs, Sat after dialysis    . ferrous sulfate 325 (65 FE) MG tablet Take 325 mg by mouth 3 (three) times daily with meals. Reported on 04/09/2016    . hydrALAZINE (APRESOLINE) 50 MG tablet Take 25 mg by mouth 3  (three) times daily. Reported on 04/09/2016    . Mesalamine 800 MG TBEC Take 1 tablet (800 mg total) by mouth 3 (three) times daily. 90 tablet 6  . oxyCODONE-acetaminophen (PERCOCET) 7.5-325 MG tablet Take 1 tablet by mouth every 4 (four) hours as needed for severe pain. 30 tablet 0  . simvastatin (ZOCOR) 5 MG tablet Take 1 tablet (5 mg total) by mouth at bedtime. 30 tablet 0   No current facility-administered medications on file prior to visit.     There are no Patient Instructions on file for this visit. No Follow-up on file.   Yaiza Palazzola A [place] Vandergriff, PA-C

## 2017-01-11 DIAGNOSIS — N2581 Secondary hyperparathyroidism of renal origin: Secondary | ICD-10-CM | POA: Diagnosis not present

## 2017-01-11 DIAGNOSIS — N186 End stage renal disease: Secondary | ICD-10-CM | POA: Diagnosis not present

## 2017-01-11 DIAGNOSIS — D509 Iron deficiency anemia, unspecified: Secondary | ICD-10-CM | POA: Diagnosis not present

## 2017-01-11 DIAGNOSIS — D631 Anemia in chronic kidney disease: Secondary | ICD-10-CM | POA: Diagnosis not present

## 2017-01-11 NOTE — Telephone Encounter (Signed)
Left message for patient

## 2017-01-12 DIAGNOSIS — D631 Anemia in chronic kidney disease: Secondary | ICD-10-CM | POA: Diagnosis not present

## 2017-01-12 DIAGNOSIS — E785 Hyperlipidemia, unspecified: Secondary | ICD-10-CM | POA: Diagnosis not present

## 2017-01-12 DIAGNOSIS — S40021A Contusion of right upper arm, initial encounter: Secondary | ICD-10-CM | POA: Diagnosis not present

## 2017-01-12 DIAGNOSIS — T82897A Other specified complication of cardiac prosthetic devices, implants and grafts, initial encounter: Secondary | ICD-10-CM | POA: Diagnosis not present

## 2017-01-12 DIAGNOSIS — I120 Hypertensive chronic kidney disease with stage 5 chronic kidney disease or end stage renal disease: Secondary | ICD-10-CM | POA: Diagnosis not present

## 2017-01-12 DIAGNOSIS — N186 End stage renal disease: Secondary | ICD-10-CM | POA: Diagnosis not present

## 2017-01-13 DIAGNOSIS — D509 Iron deficiency anemia, unspecified: Secondary | ICD-10-CM | POA: Diagnosis not present

## 2017-01-13 DIAGNOSIS — T8610 Unspecified complication of kidney transplant: Secondary | ICD-10-CM | POA: Diagnosis not present

## 2017-01-13 DIAGNOSIS — N186 End stage renal disease: Secondary | ICD-10-CM | POA: Diagnosis not present

## 2017-01-13 DIAGNOSIS — D631 Anemia in chronic kidney disease: Secondary | ICD-10-CM | POA: Diagnosis not present

## 2017-01-13 DIAGNOSIS — Z992 Dependence on renal dialysis: Secondary | ICD-10-CM | POA: Diagnosis not present

## 2017-01-13 DIAGNOSIS — N2581 Secondary hyperparathyroidism of renal origin: Secondary | ICD-10-CM | POA: Diagnosis not present

## 2017-01-15 DIAGNOSIS — N186 End stage renal disease: Secondary | ICD-10-CM | POA: Diagnosis not present

## 2017-01-15 DIAGNOSIS — S40021A Contusion of right upper arm, initial encounter: Secondary | ICD-10-CM | POA: Diagnosis not present

## 2017-01-15 DIAGNOSIS — D631 Anemia in chronic kidney disease: Secondary | ICD-10-CM | POA: Diagnosis not present

## 2017-01-15 DIAGNOSIS — E785 Hyperlipidemia, unspecified: Secondary | ICD-10-CM | POA: Diagnosis not present

## 2017-01-15 DIAGNOSIS — T82897A Other specified complication of cardiac prosthetic devices, implants and grafts, initial encounter: Secondary | ICD-10-CM | POA: Diagnosis not present

## 2017-01-15 DIAGNOSIS — I120 Hypertensive chronic kidney disease with stage 5 chronic kidney disease or end stage renal disease: Secondary | ICD-10-CM | POA: Diagnosis not present

## 2017-01-16 DIAGNOSIS — N186 End stage renal disease: Secondary | ICD-10-CM | POA: Diagnosis not present

## 2017-01-16 DIAGNOSIS — N2581 Secondary hyperparathyroidism of renal origin: Secondary | ICD-10-CM | POA: Diagnosis not present

## 2017-01-16 DIAGNOSIS — D631 Anemia in chronic kidney disease: Secondary | ICD-10-CM | POA: Diagnosis not present

## 2017-01-18 DIAGNOSIS — E785 Hyperlipidemia, unspecified: Secondary | ICD-10-CM | POA: Diagnosis not present

## 2017-01-18 DIAGNOSIS — D631 Anemia in chronic kidney disease: Secondary | ICD-10-CM | POA: Diagnosis not present

## 2017-01-18 DIAGNOSIS — S40021A Contusion of right upper arm, initial encounter: Secondary | ICD-10-CM | POA: Diagnosis not present

## 2017-01-18 DIAGNOSIS — N186 End stage renal disease: Secondary | ICD-10-CM | POA: Diagnosis not present

## 2017-01-18 DIAGNOSIS — N2581 Secondary hyperparathyroidism of renal origin: Secondary | ICD-10-CM | POA: Diagnosis not present

## 2017-01-18 DIAGNOSIS — I120 Hypertensive chronic kidney disease with stage 5 chronic kidney disease or end stage renal disease: Secondary | ICD-10-CM | POA: Diagnosis not present

## 2017-01-18 DIAGNOSIS — T82897A Other specified complication of cardiac prosthetic devices, implants and grafts, initial encounter: Secondary | ICD-10-CM | POA: Diagnosis not present

## 2017-01-20 DIAGNOSIS — N186 End stage renal disease: Secondary | ICD-10-CM | POA: Diagnosis not present

## 2017-01-20 DIAGNOSIS — N2581 Secondary hyperparathyroidism of renal origin: Secondary | ICD-10-CM | POA: Diagnosis not present

## 2017-01-20 DIAGNOSIS — D631 Anemia in chronic kidney disease: Secondary | ICD-10-CM | POA: Diagnosis not present

## 2017-01-22 DIAGNOSIS — N186 End stage renal disease: Secondary | ICD-10-CM | POA: Diagnosis not present

## 2017-01-22 DIAGNOSIS — T82897A Other specified complication of cardiac prosthetic devices, implants and grafts, initial encounter: Secondary | ICD-10-CM | POA: Diagnosis not present

## 2017-01-22 DIAGNOSIS — I120 Hypertensive chronic kidney disease with stage 5 chronic kidney disease or end stage renal disease: Secondary | ICD-10-CM | POA: Diagnosis not present

## 2017-01-22 DIAGNOSIS — S40021A Contusion of right upper arm, initial encounter: Secondary | ICD-10-CM | POA: Diagnosis not present

## 2017-01-22 DIAGNOSIS — E785 Hyperlipidemia, unspecified: Secondary | ICD-10-CM | POA: Diagnosis not present

## 2017-01-22 DIAGNOSIS — D631 Anemia in chronic kidney disease: Secondary | ICD-10-CM | POA: Diagnosis not present

## 2017-01-23 DIAGNOSIS — N186 End stage renal disease: Secondary | ICD-10-CM | POA: Diagnosis not present

## 2017-01-23 DIAGNOSIS — N2581 Secondary hyperparathyroidism of renal origin: Secondary | ICD-10-CM | POA: Diagnosis not present

## 2017-01-23 DIAGNOSIS — D631 Anemia in chronic kidney disease: Secondary | ICD-10-CM | POA: Diagnosis not present

## 2017-01-25 DIAGNOSIS — I120 Hypertensive chronic kidney disease with stage 5 chronic kidney disease or end stage renal disease: Secondary | ICD-10-CM | POA: Diagnosis not present

## 2017-01-25 DIAGNOSIS — D631 Anemia in chronic kidney disease: Secondary | ICD-10-CM | POA: Diagnosis not present

## 2017-01-25 DIAGNOSIS — N186 End stage renal disease: Secondary | ICD-10-CM | POA: Diagnosis not present

## 2017-01-25 DIAGNOSIS — N2581 Secondary hyperparathyroidism of renal origin: Secondary | ICD-10-CM | POA: Diagnosis not present

## 2017-01-25 DIAGNOSIS — T82897A Other specified complication of cardiac prosthetic devices, implants and grafts, initial encounter: Secondary | ICD-10-CM | POA: Diagnosis not present

## 2017-01-25 DIAGNOSIS — E785 Hyperlipidemia, unspecified: Secondary | ICD-10-CM | POA: Diagnosis not present

## 2017-01-25 DIAGNOSIS — S40021A Contusion of right upper arm, initial encounter: Secondary | ICD-10-CM | POA: Diagnosis not present

## 2017-01-27 DIAGNOSIS — N186 End stage renal disease: Secondary | ICD-10-CM | POA: Diagnosis not present

## 2017-01-27 DIAGNOSIS — D631 Anemia in chronic kidney disease: Secondary | ICD-10-CM | POA: Diagnosis not present

## 2017-01-27 DIAGNOSIS — N2581 Secondary hyperparathyroidism of renal origin: Secondary | ICD-10-CM | POA: Diagnosis not present

## 2017-01-29 DIAGNOSIS — S40021A Contusion of right upper arm, initial encounter: Secondary | ICD-10-CM | POA: Diagnosis not present

## 2017-01-29 DIAGNOSIS — E785 Hyperlipidemia, unspecified: Secondary | ICD-10-CM | POA: Diagnosis not present

## 2017-01-29 DIAGNOSIS — D631 Anemia in chronic kidney disease: Secondary | ICD-10-CM | POA: Diagnosis not present

## 2017-01-29 DIAGNOSIS — I120 Hypertensive chronic kidney disease with stage 5 chronic kidney disease or end stage renal disease: Secondary | ICD-10-CM | POA: Diagnosis not present

## 2017-01-29 DIAGNOSIS — N186 End stage renal disease: Secondary | ICD-10-CM | POA: Diagnosis not present

## 2017-01-29 DIAGNOSIS — T82897A Other specified complication of cardiac prosthetic devices, implants and grafts, initial encounter: Secondary | ICD-10-CM | POA: Diagnosis not present

## 2017-01-30 DIAGNOSIS — D631 Anemia in chronic kidney disease: Secondary | ICD-10-CM | POA: Diagnosis not present

## 2017-01-30 DIAGNOSIS — N186 End stage renal disease: Secondary | ICD-10-CM | POA: Diagnosis not present

## 2017-01-30 DIAGNOSIS — N2581 Secondary hyperparathyroidism of renal origin: Secondary | ICD-10-CM | POA: Diagnosis not present

## 2017-02-01 DIAGNOSIS — S40021A Contusion of right upper arm, initial encounter: Secondary | ICD-10-CM | POA: Diagnosis not present

## 2017-02-01 DIAGNOSIS — N186 End stage renal disease: Secondary | ICD-10-CM | POA: Diagnosis not present

## 2017-02-01 DIAGNOSIS — T82897A Other specified complication of cardiac prosthetic devices, implants and grafts, initial encounter: Secondary | ICD-10-CM | POA: Diagnosis not present

## 2017-02-01 DIAGNOSIS — E785 Hyperlipidemia, unspecified: Secondary | ICD-10-CM | POA: Diagnosis not present

## 2017-02-01 DIAGNOSIS — D631 Anemia in chronic kidney disease: Secondary | ICD-10-CM | POA: Diagnosis not present

## 2017-02-01 DIAGNOSIS — N2581 Secondary hyperparathyroidism of renal origin: Secondary | ICD-10-CM | POA: Diagnosis not present

## 2017-02-01 DIAGNOSIS — I120 Hypertensive chronic kidney disease with stage 5 chronic kidney disease or end stage renal disease: Secondary | ICD-10-CM | POA: Diagnosis not present

## 2017-02-03 DIAGNOSIS — N2581 Secondary hyperparathyroidism of renal origin: Secondary | ICD-10-CM | POA: Diagnosis not present

## 2017-02-03 DIAGNOSIS — N186 End stage renal disease: Secondary | ICD-10-CM | POA: Diagnosis not present

## 2017-02-03 DIAGNOSIS — D631 Anemia in chronic kidney disease: Secondary | ICD-10-CM | POA: Diagnosis not present

## 2017-02-05 DIAGNOSIS — I120 Hypertensive chronic kidney disease with stage 5 chronic kidney disease or end stage renal disease: Secondary | ICD-10-CM | POA: Diagnosis not present

## 2017-02-05 DIAGNOSIS — T82897A Other specified complication of cardiac prosthetic devices, implants and grafts, initial encounter: Secondary | ICD-10-CM | POA: Diagnosis not present

## 2017-02-05 DIAGNOSIS — D631 Anemia in chronic kidney disease: Secondary | ICD-10-CM | POA: Diagnosis not present

## 2017-02-05 DIAGNOSIS — E785 Hyperlipidemia, unspecified: Secondary | ICD-10-CM | POA: Diagnosis not present

## 2017-02-05 DIAGNOSIS — N186 End stage renal disease: Secondary | ICD-10-CM | POA: Diagnosis not present

## 2017-02-05 DIAGNOSIS — S40021A Contusion of right upper arm, initial encounter: Secondary | ICD-10-CM | POA: Diagnosis not present

## 2017-02-06 DIAGNOSIS — N186 End stage renal disease: Secondary | ICD-10-CM | POA: Diagnosis not present

## 2017-02-06 DIAGNOSIS — D631 Anemia in chronic kidney disease: Secondary | ICD-10-CM | POA: Diagnosis not present

## 2017-02-06 DIAGNOSIS — N2581 Secondary hyperparathyroidism of renal origin: Secondary | ICD-10-CM | POA: Diagnosis not present

## 2017-02-08 DIAGNOSIS — T82897A Other specified complication of cardiac prosthetic devices, implants and grafts, initial encounter: Secondary | ICD-10-CM | POA: Diagnosis not present

## 2017-02-08 DIAGNOSIS — N2581 Secondary hyperparathyroidism of renal origin: Secondary | ICD-10-CM | POA: Diagnosis not present

## 2017-02-08 DIAGNOSIS — S40021A Contusion of right upper arm, initial encounter: Secondary | ICD-10-CM | POA: Diagnosis not present

## 2017-02-08 DIAGNOSIS — I120 Hypertensive chronic kidney disease with stage 5 chronic kidney disease or end stage renal disease: Secondary | ICD-10-CM | POA: Diagnosis not present

## 2017-02-08 DIAGNOSIS — E785 Hyperlipidemia, unspecified: Secondary | ICD-10-CM | POA: Diagnosis not present

## 2017-02-08 DIAGNOSIS — N186 End stage renal disease: Secondary | ICD-10-CM | POA: Diagnosis not present

## 2017-02-08 DIAGNOSIS — D631 Anemia in chronic kidney disease: Secondary | ICD-10-CM | POA: Diagnosis not present

## 2017-02-10 DIAGNOSIS — N186 End stage renal disease: Secondary | ICD-10-CM | POA: Diagnosis not present

## 2017-02-10 DIAGNOSIS — D631 Anemia in chronic kidney disease: Secondary | ICD-10-CM | POA: Diagnosis not present

## 2017-02-10 DIAGNOSIS — N2581 Secondary hyperparathyroidism of renal origin: Secondary | ICD-10-CM | POA: Diagnosis not present

## 2017-02-12 DIAGNOSIS — D631 Anemia in chronic kidney disease: Secondary | ICD-10-CM | POA: Diagnosis not present

## 2017-02-12 DIAGNOSIS — N186 End stage renal disease: Secondary | ICD-10-CM | POA: Diagnosis not present

## 2017-02-12 DIAGNOSIS — T82897A Other specified complication of cardiac prosthetic devices, implants and grafts, initial encounter: Secondary | ICD-10-CM | POA: Diagnosis not present

## 2017-02-12 DIAGNOSIS — Z992 Dependence on renal dialysis: Secondary | ICD-10-CM | POA: Diagnosis not present

## 2017-02-12 DIAGNOSIS — E785 Hyperlipidemia, unspecified: Secondary | ICD-10-CM | POA: Diagnosis not present

## 2017-02-12 DIAGNOSIS — I120 Hypertensive chronic kidney disease with stage 5 chronic kidney disease or end stage renal disease: Secondary | ICD-10-CM | POA: Diagnosis not present

## 2017-02-12 DIAGNOSIS — S40021A Contusion of right upper arm, initial encounter: Secondary | ICD-10-CM | POA: Diagnosis not present

## 2017-02-12 DIAGNOSIS — T8610 Unspecified complication of kidney transplant: Secondary | ICD-10-CM | POA: Diagnosis not present

## 2017-02-13 DIAGNOSIS — N186 End stage renal disease: Secondary | ICD-10-CM | POA: Diagnosis not present

## 2017-02-13 DIAGNOSIS — D631 Anemia in chronic kidney disease: Secondary | ICD-10-CM | POA: Diagnosis not present

## 2017-02-13 DIAGNOSIS — Z992 Dependence on renal dialysis: Secondary | ICD-10-CM | POA: Diagnosis not present

## 2017-02-13 DIAGNOSIS — T827XXD Infection and inflammatory reaction due to other cardiac and vascular devices, implants and grafts, subsequent encounter: Secondary | ICD-10-CM | POA: Diagnosis not present

## 2017-02-13 DIAGNOSIS — N2581 Secondary hyperparathyroidism of renal origin: Secondary | ICD-10-CM | POA: Diagnosis not present

## 2017-02-13 DIAGNOSIS — E785 Hyperlipidemia, unspecified: Secondary | ICD-10-CM | POA: Diagnosis not present

## 2017-02-13 DIAGNOSIS — G4733 Obstructive sleep apnea (adult) (pediatric): Secondary | ICD-10-CM | POA: Diagnosis not present

## 2017-02-13 DIAGNOSIS — K5790 Diverticulosis of intestine, part unspecified, without perforation or abscess without bleeding: Secondary | ICD-10-CM | POA: Diagnosis not present

## 2017-02-13 DIAGNOSIS — I120 Hypertensive chronic kidney disease with stage 5 chronic kidney disease or end stage renal disease: Secondary | ICD-10-CM | POA: Diagnosis not present

## 2017-02-13 DIAGNOSIS — D689 Coagulation defect, unspecified: Secondary | ICD-10-CM | POA: Diagnosis not present

## 2017-02-15 ENCOUNTER — Other Ambulatory Visit (INDEPENDENT_AMBULATORY_CARE_PROVIDER_SITE_OTHER): Payer: Self-pay | Admitting: Vascular Surgery

## 2017-02-15 ENCOUNTER — Ambulatory Visit
Admission: RE | Admit: 2017-02-15 | Discharge: 2017-02-15 | Disposition: A | Discharge status: Home / Self Care | Payer: Medicare Other | Source: Ambulatory Visit | Attending: Vascular Surgery | Admitting: Vascular Surgery

## 2017-02-15 DIAGNOSIS — N186 End stage renal disease: Secondary | ICD-10-CM | POA: Diagnosis not present

## 2017-02-15 DIAGNOSIS — D631 Anemia in chronic kidney disease: Secondary | ICD-10-CM | POA: Diagnosis not present

## 2017-02-15 DIAGNOSIS — N2581 Secondary hyperparathyroidism of renal origin: Secondary | ICD-10-CM | POA: Diagnosis not present

## 2017-02-15 DIAGNOSIS — T82868A Thrombosis of vascular prosthetic devices, implants and grafts, initial encounter: Secondary | ICD-10-CM | POA: Diagnosis not present

## 2017-02-15 DIAGNOSIS — X58XXXA Exposure to other specified factors, initial encounter: Secondary | ICD-10-CM | POA: Diagnosis not present

## 2017-02-15 DIAGNOSIS — D689 Coagulation defect, unspecified: Secondary | ICD-10-CM | POA: Diagnosis not present

## 2017-02-15 MED ORDER — ONDANSETRON HCL 4 MG/2ML IJ SOLN: 4.0000 mg | Freq: Four times a day (QID) | INTRAMUSCULAR | Status: DC | PRN

## 2017-02-15 MED ORDER — SODIUM CHLORIDE 0.9 % IV SOLN: 10.0000 mg | Freq: Once | INTRAVENOUS | Status: DC

## 2017-02-15 MED ORDER — SODIUM CHLORIDE 0.9 % IV SOLN
5.0000 mg | Freq: Once | INTRAVENOUS | Status: AC
  Administered 2017-02-15: 5 mg via INTRAVENOUS
  Filled 2017-02-15: qty 5

## 2017-02-15 MED ORDER — SODIUM CHLORIDE 0.9 % IV SOLN: INTRAVENOUS | Status: DC

## 2017-02-15 MED ORDER — METHYLPREDNISOLONE SODIUM SUCC 125 MG IJ SOLR: 125.0000 mg | INTRAMUSCULAR | Status: DC | PRN

## 2017-02-15 MED ORDER — CEFAZOLIN SODIUM-DEXTROSE 1-4 GM/50ML-% IV SOLN: 1.0000 g | Freq: Once | INTRAVENOUS | Status: DC

## 2017-02-15 MED ORDER — HYDROMORPHONE HCL 1 MG/ML IJ SOLN: 1.0000 mg | Freq: Once | INTRAMUSCULAR | Status: DC | PRN

## 2017-02-15 MED ORDER — ALTEPLASE 2 MG IJ SOLR
5.0000 mg | Freq: Once | INTRAMUSCULAR | Status: AC
  Administered 2017-02-15: 5 mg via INTRAVENOUS
  Filled 2017-02-15: qty 5

## 2017-02-15 MED ORDER — FAMOTIDINE 20 MG PO TABS: 40.0000 mg | ORAL_TABLET | ORAL | Status: DC | PRN

## 2017-02-15 MED ORDER — SODIUM CHLORIDE 0.9 % IV SOLN: 5.0000 mg | Freq: Once | INTRAVENOUS | Status: DC

## 2017-02-15 NOTE — OR Nursing (Signed)
Patient received at 11:15 for tpa infusion for perm cath occlusion.  tpa connected to perm cath ports per policy and procedure infusing at 40m/hr for 2.5 hours, ns hung after tpa completed.  kristy howard, rt in and flushed perm cath ports and applied dsd to site.  Patient discharged to home

## 2017-02-17 DIAGNOSIS — N186 End stage renal disease: Secondary | ICD-10-CM | POA: Diagnosis not present

## 2017-02-17 DIAGNOSIS — N2581 Secondary hyperparathyroidism of renal origin: Secondary | ICD-10-CM | POA: Diagnosis not present

## 2017-02-17 DIAGNOSIS — D631 Anemia in chronic kidney disease: Secondary | ICD-10-CM | POA: Diagnosis not present

## 2017-02-17 DIAGNOSIS — D689 Coagulation defect, unspecified: Secondary | ICD-10-CM | POA: Diagnosis not present

## 2017-02-19 DIAGNOSIS — T827XXD Infection and inflammatory reaction due to other cardiac and vascular devices, implants and grafts, subsequent encounter: Secondary | ICD-10-CM | POA: Diagnosis not present

## 2017-02-19 DIAGNOSIS — N186 End stage renal disease: Secondary | ICD-10-CM | POA: Diagnosis not present

## 2017-02-19 DIAGNOSIS — K5790 Diverticulosis of intestine, part unspecified, without perforation or abscess without bleeding: Secondary | ICD-10-CM | POA: Diagnosis not present

## 2017-02-19 DIAGNOSIS — D631 Anemia in chronic kidney disease: Secondary | ICD-10-CM | POA: Diagnosis not present

## 2017-02-19 DIAGNOSIS — I120 Hypertensive chronic kidney disease with stage 5 chronic kidney disease or end stage renal disease: Secondary | ICD-10-CM | POA: Diagnosis not present

## 2017-02-19 DIAGNOSIS — E785 Hyperlipidemia, unspecified: Secondary | ICD-10-CM | POA: Diagnosis not present

## 2017-02-20 DIAGNOSIS — N2581 Secondary hyperparathyroidism of renal origin: Secondary | ICD-10-CM | POA: Diagnosis not present

## 2017-02-20 DIAGNOSIS — D689 Coagulation defect, unspecified: Secondary | ICD-10-CM | POA: Diagnosis not present

## 2017-02-20 DIAGNOSIS — D631 Anemia in chronic kidney disease: Secondary | ICD-10-CM | POA: Diagnosis not present

## 2017-02-20 DIAGNOSIS — N186 End stage renal disease: Secondary | ICD-10-CM | POA: Diagnosis not present

## 2017-02-22 ENCOUNTER — Other Ambulatory Visit (INDEPENDENT_AMBULATORY_CARE_PROVIDER_SITE_OTHER): Payer: Self-pay | Admitting: Vascular Surgery

## 2017-02-22 ENCOUNTER — Encounter (INDEPENDENT_AMBULATORY_CARE_PROVIDER_SITE_OTHER): Payer: Self-pay

## 2017-02-22 DIAGNOSIS — D631 Anemia in chronic kidney disease: Secondary | ICD-10-CM | POA: Diagnosis not present

## 2017-02-22 DIAGNOSIS — N2581 Secondary hyperparathyroidism of renal origin: Secondary | ICD-10-CM | POA: Diagnosis not present

## 2017-02-22 DIAGNOSIS — N186 End stage renal disease: Secondary | ICD-10-CM | POA: Diagnosis not present

## 2017-02-22 DIAGNOSIS — D689 Coagulation defect, unspecified: Secondary | ICD-10-CM | POA: Diagnosis not present

## 2017-02-24 DIAGNOSIS — N2581 Secondary hyperparathyroidism of renal origin: Secondary | ICD-10-CM | POA: Diagnosis not present

## 2017-02-24 DIAGNOSIS — D689 Coagulation defect, unspecified: Secondary | ICD-10-CM | POA: Diagnosis not present

## 2017-02-24 DIAGNOSIS — N186 End stage renal disease: Secondary | ICD-10-CM | POA: Diagnosis not present

## 2017-02-24 DIAGNOSIS — D631 Anemia in chronic kidney disease: Secondary | ICD-10-CM | POA: Diagnosis not present

## 2017-02-26 ENCOUNTER — Encounter
Admission: RE | Disposition: A | Discharge status: Home / Self Care | Payer: Self-pay | Source: Ambulatory Visit | Attending: Vascular Surgery

## 2017-02-26 ENCOUNTER — Ambulatory Visit
Admission: RE | Admit: 2017-02-26 | Discharge: 2017-02-26 | Disposition: A | Discharge status: Home / Self Care | Payer: Medicare Other | Source: Ambulatory Visit | Attending: Vascular Surgery | Admitting: Vascular Surgery

## 2017-02-26 DIAGNOSIS — Z9889 Other specified postprocedural states: Secondary | ICD-10-CM | POA: Insufficient documentation

## 2017-02-26 DIAGNOSIS — G4733 Obstructive sleep apnea (adult) (pediatric): Secondary | ICD-10-CM | POA: Diagnosis not present

## 2017-02-26 DIAGNOSIS — Z888 Allergy status to other drugs, medicaments and biological substances status: Secondary | ICD-10-CM | POA: Insufficient documentation

## 2017-02-26 DIAGNOSIS — Z452 Encounter for adjustment and management of vascular access device: Secondary | ICD-10-CM | POA: Diagnosis not present

## 2017-02-26 DIAGNOSIS — N186 End stage renal disease: Secondary | ICD-10-CM | POA: Diagnosis not present

## 2017-02-26 DIAGNOSIS — T82868A Thrombosis of vascular prosthetic devices, implants and grafts, initial encounter: Secondary | ICD-10-CM | POA: Diagnosis not present

## 2017-02-26 DIAGNOSIS — I10 Essential (primary) hypertension: Secondary | ICD-10-CM | POA: Diagnosis not present

## 2017-02-26 DIAGNOSIS — I120 Hypertensive chronic kidney disease with stage 5 chronic kidney disease or end stage renal disease: Secondary | ICD-10-CM | POA: Diagnosis not present

## 2017-02-26 DIAGNOSIS — Z8041 Family history of malignant neoplasm of ovary: Secondary | ICD-10-CM | POA: Insufficient documentation

## 2017-02-26 DIAGNOSIS — Z992 Dependence on renal dialysis: Secondary | ICD-10-CM | POA: Diagnosis not present

## 2017-02-26 DIAGNOSIS — Z940 Kidney transplant status: Secondary | ICD-10-CM | POA: Insufficient documentation

## 2017-02-26 DIAGNOSIS — Z8249 Family history of ischemic heart disease and other diseases of the circulatory system: Secondary | ICD-10-CM | POA: Insufficient documentation

## 2017-02-26 DIAGNOSIS — Z809 Family history of malignant neoplasm, unspecified: Secondary | ICD-10-CM | POA: Diagnosis not present

## 2017-02-26 DIAGNOSIS — Z825 Family history of asthma and other chronic lower respiratory diseases: Secondary | ICD-10-CM | POA: Diagnosis not present

## 2017-02-26 DIAGNOSIS — D631 Anemia in chronic kidney disease: Secondary | ICD-10-CM | POA: Diagnosis not present

## 2017-02-26 DIAGNOSIS — E785 Hyperlipidemia, unspecified: Secondary | ICD-10-CM | POA: Diagnosis not present

## 2017-02-26 DIAGNOSIS — R600 Localized edema: Secondary | ICD-10-CM | POA: Diagnosis not present

## 2017-02-26 HISTORY — PX: DIALYSIS/PERMA CATHETER INSERTION: CATH118288

## 2017-02-26 SURGERY — DIALYSIS/PERMA CATHETER INSERTION

## 2017-02-26 MED ORDER — LIDOCAINE-EPINEPHRINE (PF) 2 %-1:200000 IJ SOLN
INTRAMUSCULAR | Status: AC
  Filled 2017-02-26: qty 20

## 2017-02-26 MED ORDER — CEFAZOLIN SODIUM-DEXTROSE 1-4 GM/50ML-% IV SOLN
INTRAVENOUS | Status: AC
  Filled 2017-02-26: qty 50

## 2017-02-26 MED ORDER — MIDAZOLAM HCL 2 MG/2ML IJ SOLN
INTRAMUSCULAR | Status: AC
  Filled 2017-02-26: qty 2

## 2017-02-26 MED ORDER — FAMOTIDINE 20 MG PO TABS: 40.0000 mg | ORAL_TABLET | ORAL | Status: DC | PRN

## 2017-02-26 MED ORDER — IOPAMIDOL (ISOVUE-300) INJECTION 61%
INTRAVENOUS | Status: DC | PRN
  Administered 2017-02-26: 5 mL via INTRA_ARTERIAL

## 2017-02-26 MED ORDER — SODIUM CHLORIDE 0.9 % IV SOLN
INTRAVENOUS | Status: DC
  Administered 2017-02-26: 12:00:00 via INTRAVENOUS

## 2017-02-26 MED ORDER — ONDANSETRON HCL 4 MG/2ML IJ SOLN: 4.0000 mg | Freq: Four times a day (QID) | INTRAMUSCULAR | Status: DC | PRN

## 2017-02-26 MED ORDER — FENTANYL CITRATE (PF) 100 MCG/2ML IJ SOLN
INTRAMUSCULAR | Status: AC
  Filled 2017-02-26: qty 2

## 2017-02-26 MED ORDER — MIDAZOLAM HCL 2 MG/2ML IJ SOLN
INTRAMUSCULAR | Status: DC | PRN
  Administered 2017-02-26 (×2): 2 mg via INTRAVENOUS

## 2017-02-26 MED ORDER — FENTANYL CITRATE (PF) 100 MCG/2ML IJ SOLN
INTRAMUSCULAR | Status: DC | PRN
  Administered 2017-02-26 (×3): 50 ug via INTRAVENOUS

## 2017-02-26 MED ORDER — HEPARIN SODIUM (PORCINE) 10000 UNIT/ML IJ SOLN
INTRAMUSCULAR | Status: AC
  Filled 2017-02-26: qty 1

## 2017-02-26 MED ORDER — CEFAZOLIN SODIUM-DEXTROSE 1-4 GM/50ML-% IV SOLN
1.0000 g | Freq: Once | INTRAVENOUS | Status: AC
  Administered 2017-02-26: 1 g via INTRAVENOUS

## 2017-02-26 MED ORDER — HYDROMORPHONE HCL 1 MG/ML IJ SOLN: 1.0000 mg | Freq: Once | INTRAMUSCULAR | Status: DC | PRN

## 2017-02-26 MED ORDER — METHYLPREDNISOLONE SODIUM SUCC 125 MG IJ SOLR: 125.0000 mg | INTRAMUSCULAR | Status: DC | PRN

## 2017-02-26 SURGICAL SUPPLY — 8 items
CATH DIALYSIS PAL 33 888814504 (CATHETERS) ×2 IMPLANT
CATH PALINDROME-P 23CM W/VT (CATHETERS) ×3 IMPLANT
CATH TORCON 5FR 0.38 (CATHETERS) ×2 IMPLANT
GLIDEWIRE ADV .035X180CM (WIRE) ×2 IMPLANT
GUIDEWIRE SUPER STIFF .035X180 (WIRE) ×2 IMPLANT
PACK ANGIOGRAPHY (CUSTOM PROCEDURE TRAY) ×2 IMPLANT
SUT MNCRL AB 4-0 PS2 18 (SUTURE) ×2 IMPLANT
SUT PROLENE 0 CT 1 30 (SUTURE) ×2 IMPLANT

## 2017-02-26 NOTE — Op Note (Signed)
OPERATIVE NOTE    PRE-OPERATIVE DIAGNOSIS: 1. ESRD 2. Non-functional permcath  POST-OPERATIVE DIAGNOSIS: same as above  PROCEDURE: 1. Fluoroscopic guidance for placement of catheter 2. Catheter placement into left innominate vein from left jugular approach 3. Central venogram/SVC gram 4. Placement of a 33 cm tip to cuff tunneled hemodialysis catheter via the left internal jugular vein and removal of previous catheter  SURGEON: Leotis Pain, MD  ANESTHESIA:  Local with moderate conscious sedation for 25 minutes using 4 mg of Versed and 150 mcg of Fentanyl  ESTIMATED BLOOD LOSS: 100 cc  FINDING(S): Moderate stenosis in the superior vena cava around the existing hero catheter  SPECIMEN(S):  None  INDICATIONS:   Patient is a 51 y.o.male who presents with non-functional dialysis catheter and ESRD.  The patient needs long term dialysis access for their ESRD, and a Permcath is necessary.  Risks and benefits are discussed and informed consent is obtained.    DESCRIPTION: After obtaining full informed written consent, the patient was brought back to the vascular suite. The patient received moderate conscious sedation during a face-to-face encounter with me present throughout the entire procedure and supervising the RN monitoring the vital signs, pulse oximetry, telemetry, and mental status throughout the entire procedure. The patient's existing catheter, left neck and chest were sterilely prepped and draped in a sterile surgical field was created.  The existing catheter was dissected free from the fibrous sheath securing the cuff with hemostats and blunt dissection.  A wire was placed but did not pass particularly easily. The existing catheter was then removed and the wire used to keep venous access. I selected a 23 cm tip to cuff tunneled dialysis catheter. This initially seemed to hang up in the superior vena cava and was going to be too short for easy placement. For further evaluation, I placed  an 8 French sheath and a Kumpe catheter into the innominate vein. Imaging was performed which showed a moderate appearing stenosis in the superior vena cava around the central portion of the hero graft. I elected not to angioplasty this and make sure we used a longer catheter to get past the stenosis for fear of harming the potential permanent access of the hero graft. I then upsized to a 33 cm tip to cuff catheter and parked this deep in the right atrium over a stiff wire. The cuff was just inside the skin with this longer catheter but clearly in a good location. This was done using fluoroscopic guidance. The appropriate distal connectors were placed. It withdrew blood well and flushed easily with heparinized saline and a concentrated heparin solution was then placed. It was secured to the chest wall with 2 Prolene sutures. A 4-0 Monocryl pursestring suture was placed around the exit site. Sterile dressings were placed. The patient tolerated the procedure well and was taken to the recovery room in stable condition.  COMPLICATIONS: None  CONDITION: Stable  Leotis Pain 02/26/2017 12:39 PM   This note was created with Dragon Medical transcription system. Any errors in dictation are purely unintentional.

## 2017-02-26 NOTE — H&P (Signed)
[facility] Admission History & Physical  MRN : [id number]  Jonathon Conley is a 51 y.o. (07-27-1966) male who presents with chief complaint of No chief complaint on file. Marland Kitchen  History of Present Illness: Patient presents for difficulties with his PermCath and a difficult dialysis access situation. He had a hero graft placed about 4 months ago but this has not ever been usable due to seroma with right arm swelling. His catheter has been running sluggishly a dialysis and has already had a TPA infusion. It is now not working well enough to provide adequate dialysis. He denies fevers or chills. He has no signs of systemic infection. He is still bothered by right arm  swelling. Current Facility-Administered Medications  Medication Dose Route Frequency Provider Last Rate Last Dose  . 0.9 %  sodium chloride infusion   Intravenous Continuous Stegmayer, Kimberly A, PA-C      . ceFAZolin (ANCEF) IVPB 1 g/50 mL premix  1 g Intravenous Once Stegmayer, Kimberly A, PA-C      . famotidine (PEPCID) tablet 40 mg  40 mg Oral PRN Stegmayer, Janalyn Harder, PA-C      . HYDROmorphone (DILAUDID) injection 1 mg  1 mg Intravenous Once PRN Stegmayer, Kimberly A, PA-C      . methylPREDNISolone sodium succinate (SOLU-MEDROL) 125 mg/2 mL injection 125 mg  125 mg Intravenous PRN Stegmayer, Kimberly A, PA-C      . ondansetron (ZOFRAN) injection 4 mg  4 mg Intravenous Q6H PRN Stegmayer, Janalyn Harder, PA-C        Past Medical History:  Diagnosis Date  . Anemia   . Asthma    childhood  . Blood transfusion without reported diagnosis   . Chronic kidney disease    Dialysis Tues, Thurs, Sat  . Colitis   . Diverticulosis 04/12/16   also seen: sigmoid and rectal erythema, path:   . Hemorrhoids 03/2016   bleeding at 04/12/16 colonoscopy, treated with  APC laser. cauterization  . Hyperlipidemia   . Hypertension   . OSA (obstructive sleep apnea) 03/27/2013  . Renal insufficiency     Past Surgical History:   Procedure Laterality Date  . APPLICATION OF WOUND VAC Right 12/13/2016   Procedure: APPLICATION OF WOUND VAC;  Surgeon: Algernon Huxley, MD;  Location: [facility];  Service: Vascular;  Laterality: Right;  . APPLICATION OF WOUND VAC Right 12/19/2016   Procedure: APPLICATION OF WOUND VAC;  Surgeon: Algernon Huxley, MD;  Location: [facility];  Service: Vascular;  Laterality: Right;  . COLONOSCOPY N/A 04/12/2016   Procedure: COLONOSCOPY;  Surgeon: Mauri Pole, MD;  Location: [place] [facility];  Service: Endoscopy;  Laterality: N/A;  . EXCHANGE OF A DIALYSIS CATHETER  11/20/2016   Procedure: Exchange Of A Dialysis Catheter;  Surgeon: Algernon Huxley, MD;  Location: [place] [facility];  Service: Cardiovascular;;  . fistulagrams Right   . HEMATOMA EVACUATION Right 12/13/2016   Procedure: EVACUATION HEMATOMA;  Surgeon: Algernon Huxley, MD;  Location: [facility];  Service: Vascular;  Laterality: Right;  . HEMATOMA EVACUATION Right 12/19/2016   Procedure: EVACUATIONof seroma;  Surgeon: Algernon Huxley, MD;  Location: [facility];  Service: Vascular;  Laterality: Right;  . HOT HEMOSTASIS N/A 04/12/2016   Procedure: HOT HEMOSTASIS (ARGON PLASMA COAGULATION/BICAP);  Surgeon: Mauri Pole, MD;  Location: [facility];  Service: Endoscopy;  Laterality: N/A;  . KIDNEY TRANSPLANT    . PERIPHERAL VASCULAR CATHETERIZATION Bilateral 10/02/2016   Procedure: Upper Extremity Venography;  Surgeon:  Algernon Huxley, MD;  Location: [place] [facility];  Service: Cardiovascular;  Laterality: Bilateral;  . PERIPHERAL VASCULAR CATHETERIZATION Right 11/08/2016   Procedure: DIALYSIS/PERMA CATHETER REMOVAL right Jugular;  Surgeon: Algernon Huxley, MD;  Location: [facility];  Service: Vascular;  Laterality: Right;  . PERIPHERAL VASCULAR CATHETERIZATION Left 11/08/2016   Procedure: DIALYSIS/PERMA CATHETER INSERTION left jugular with u/s guide and flouroscan;  Surgeon: Algernon Huxley, MD;  Location: [facility];  Service: Vascular;  Laterality: Left;  . PERIPHERAL  VASCULAR CATHETERIZATION N/A 11/08/2016   Procedure: IVC FILTER INSERTION;  Surgeon: Algernon Huxley, MD;  Location: [facility];  Service: Vascular;  Laterality: N/A;  . perm a cath in Rt upper chest Right   . THORACENTESIS    . VASCULAR ACCESS DEVICE INSERTION Right 11/08/2016   Procedure: INSERTION OF HERO VASCULAR ACCESS DEVICE ( GRAFT );  Surgeon: Algernon Huxley, MD;  Location: [facility];  Service: Vascular;  Laterality: Right;    Social History Social History  Substance Use Topics  . Smoking status: Never Smoker  . Smokeless tobacco: Never Used  . Alcohol use No  Married  Family History Family History  Problem Relation Age of Onset  . Heart disease Mother   . Cancer Mother        ovarian  . Cancer Maternal Grandmother   . Cancer Maternal Grandfather   . Asthma Son   . Asthma Son     Allergies  Allergen Reactions  . Lisinopril Swelling    Facial swelling     REVIEW OF SYSTEMS (Negative unless checked)  Constitutional: [] Weight loss  [] Fever  [] Chills Cardiac: [] Chest pain   [] Chest pressure   [] Palpitations   [] Shortness of breath when laying flat   [] Shortness of breath at rest   [] Shortness of breath with exertion. Vascular:  [] Pain in legs with walking   [] Pain in legs at rest   [] Pain in legs when laying flat   [] Claudication   [] Pain in feet when walking  [] Pain in feet at rest  [] Pain in feet when laying flat   [] History of DVT   [] Phlebitis   [x] Swelling in legs   [] Varicose veins   [] Non-healing ulcers Pulmonary:   [] Uses home oxygen   [] Productive cough   [] Hemoptysis   [] Wheeze  [] COPD   [] Asthma Neurologic:  [] Dizziness  [] Blackouts   [] Seizures   [] History of stroke   [] History of TIA  [] Aphasia   [] Temporary blindness   [] Dysphagia   [] Weakness or numbness in arms   [] Weakness or numbness in legs Musculoskeletal:  [] Arthritis   [] Joint swelling   [] Joint pain   [] Low back pain Hematologic:  [] Easy bruising  [] Easy bleeding   [] Hypercoagulable state   [x] Anemic   [] Hepatitis Gastrointestinal:  [] Blood in stool   [] Vomiting blood  [] Gastroesophageal reflux/heartburn   [] Difficulty swallowing. Genitourinary:  [x] Chronic kidney disease   [] Difficult urination  [] Frequent urination  [] Burning with urination   [] Blood in urine Skin:  [] Rashes   [] Ulcers   [] Wounds Psychological:  [] History of anxiety   []  History of major depression.  Physical Examination  Vitals:   02/26/17 1128  BP: (!) 154/108  Pulse: 79  Resp: 15  Temp: 97.8 F (36.6 C)  TempSrc: Oral  SpO2: 94%  Weight: 81.6 kg (180 lb)  Height: 5' 10"  (1.778 m)   Body mass index is 25.83 kg/m. Gen: WD/WN, NAD Head: [place]/AT, No temporalis wasting.  Ear/Nose/Throat: Hearing grossly intact, nares w/o erythema or drainage,  oropharynx w/o Erythema/Exudate,  Eyes: Conjunctiva clear, sclera non-icteric Neck: Trachea midline.  No JVD.  Pulmonary:  Good air movement, respirations not labored, no use of accessory muscles.  Cardiac: RRR, normal S1, S2. Vascular:  Vessel Right Left  Radial Palpable Palpable                                   Gastrointestinal: soft, non-tender/non-distended.  Musculoskeletal: M/S 5/5 throughout.  Extremities without ischemic changes.  No deformity or atrophy. Right arm remains swollen Neurologic: Sensation grossly intact in extremities.  Symmetrical.  Speech is fluent. Motor exam as listed above. Psychiatric: Judgment intact, Mood & affect appropriate for pt's clinical situation. Dermatologic: No rashes or ulcers noted.  No cellulitis or open wounds.      CBC Lab Results  Component Value Date   WBC 5.3 12/18/2016   HGB 8.9 (L) 12/18/2016   HCT 26.6 (L) 12/18/2016   MCV 83.2 12/18/2016   PLT 162 12/18/2016    BMET    Component Value Date/Time   NA 134 (L) 12/18/2016 1240   K 4.5 12/18/2016 1240   CL 96 (L) 12/18/2016 1240   CO2 26 12/18/2016 1240   GLUCOSE 98 12/18/2016 1240   BUN 49 (H) 12/18/2016 1240   CREATININE 16.70 (H) 12/18/2016  1240   CREATININE 10.76 (HH) 02/14/2015 1612   CALCIUM 8.0 (L) 12/18/2016 1240   GFRNONAA 3 (L) 12/18/2016 1240   GFRAA 3 (L) 12/18/2016 1240   CrCl cannot be calculated (Patient's most recent lab result is older than the maximum 21 days allowed.).  COAG Lab Results  Component Value Date   INR 1.04 10/23/2016   INR 1.19 11/03/2015   INR 1.19 02/15/2015    Radiology No results found.    Assessment/Plan 1. End-stage renal disease with difficult dialysis access situation. His PermCath is currently not working well enough to provide adequate dialysis. We plan to exchange this today. Risks and benefits are discussed. 2.  Hypertension. Stable on outpatient medications and blood pressure control important in reducing the progression of atherosclerotic disease. On appropriate oral medications. 3.  Anemia. Long-standing and secondary to chronic kidney disease. Not symptomatic.   Leotis Pain, MD  02/26/2017 11:30 AM

## 2017-02-27 ENCOUNTER — Encounter: Payer: Self-pay | Admitting: Vascular Surgery

## 2017-02-27 DIAGNOSIS — N2581 Secondary hyperparathyroidism of renal origin: Secondary | ICD-10-CM | POA: Diagnosis not present

## 2017-02-27 DIAGNOSIS — D631 Anemia in chronic kidney disease: Secondary | ICD-10-CM | POA: Diagnosis not present

## 2017-02-27 DIAGNOSIS — N186 End stage renal disease: Secondary | ICD-10-CM | POA: Diagnosis not present

## 2017-02-27 DIAGNOSIS — D689 Coagulation defect, unspecified: Secondary | ICD-10-CM | POA: Diagnosis not present

## 2017-03-01 ENCOUNTER — Ambulatory Visit: Admission: RE | Admit: 2017-03-01 | Payer: Medicare Other | Source: Ambulatory Visit | Admitting: Vascular Surgery

## 2017-03-01 ENCOUNTER — Encounter: Admission: RE | Payer: Self-pay | Source: Ambulatory Visit

## 2017-03-01 DIAGNOSIS — D631 Anemia in chronic kidney disease: Secondary | ICD-10-CM | POA: Diagnosis not present

## 2017-03-01 DIAGNOSIS — N186 End stage renal disease: Secondary | ICD-10-CM | POA: Diagnosis not present

## 2017-03-01 DIAGNOSIS — N2581 Secondary hyperparathyroidism of renal origin: Secondary | ICD-10-CM | POA: Diagnosis not present

## 2017-03-01 DIAGNOSIS — D689 Coagulation defect, unspecified: Secondary | ICD-10-CM | POA: Diagnosis not present

## 2017-03-01 SURGERY — PERIPHERAL VASCULAR THROMBECTOMY
Anesthesia: Moderate Sedation | Site: Arm Lower | Laterality: Right

## 2017-03-03 DIAGNOSIS — N2581 Secondary hyperparathyroidism of renal origin: Secondary | ICD-10-CM | POA: Diagnosis not present

## 2017-03-03 DIAGNOSIS — D631 Anemia in chronic kidney disease: Secondary | ICD-10-CM | POA: Diagnosis not present

## 2017-03-03 DIAGNOSIS — N186 End stage renal disease: Secondary | ICD-10-CM | POA: Diagnosis not present

## 2017-03-03 DIAGNOSIS — D689 Coagulation defect, unspecified: Secondary | ICD-10-CM | POA: Diagnosis not present

## 2017-03-05 ENCOUNTER — Telehealth (INDEPENDENT_AMBULATORY_CARE_PROVIDER_SITE_OTHER): Payer: Self-pay | Admitting: Vascular Surgery

## 2017-03-05 NOTE — Telephone Encounter (Signed)
Called the nurse with [facility] and let her know to discontinue the services to the patient since he feels that he doesn't need them anymore. Vaughan Basta, the nurse said okay and that she will take the verbal

## 2017-03-05 NOTE — Telephone Encounter (Signed)
Pt has had multiple issues with Dialysis. She has been having issues with making it out to see him because he is constantly in the hospital getting his access cleaned out. She states she needs to know if her services are still needed or does she need to just discharge him. She states patient does not feel like her services are needed please advise.

## 2017-03-06 DIAGNOSIS — N2581 Secondary hyperparathyroidism of renal origin: Secondary | ICD-10-CM | POA: Diagnosis not present

## 2017-03-06 DIAGNOSIS — N186 End stage renal disease: Secondary | ICD-10-CM | POA: Diagnosis not present

## 2017-03-06 DIAGNOSIS — D689 Coagulation defect, unspecified: Secondary | ICD-10-CM | POA: Diagnosis not present

## 2017-03-06 DIAGNOSIS — D631 Anemia in chronic kidney disease: Secondary | ICD-10-CM | POA: Diagnosis not present

## 2017-03-08 DIAGNOSIS — N2581 Secondary hyperparathyroidism of renal origin: Secondary | ICD-10-CM | POA: Diagnosis not present

## 2017-03-08 DIAGNOSIS — D631 Anemia in chronic kidney disease: Secondary | ICD-10-CM | POA: Diagnosis not present

## 2017-03-08 DIAGNOSIS — N186 End stage renal disease: Secondary | ICD-10-CM | POA: Diagnosis not present

## 2017-03-08 DIAGNOSIS — D689 Coagulation defect, unspecified: Secondary | ICD-10-CM | POA: Diagnosis not present

## 2017-03-13 DIAGNOSIS — D631 Anemia in chronic kidney disease: Secondary | ICD-10-CM | POA: Diagnosis not present

## 2017-03-13 DIAGNOSIS — D689 Coagulation defect, unspecified: Secondary | ICD-10-CM | POA: Diagnosis not present

## 2017-03-13 DIAGNOSIS — N2581 Secondary hyperparathyroidism of renal origin: Secondary | ICD-10-CM | POA: Diagnosis not present

## 2017-03-13 DIAGNOSIS — N186 End stage renal disease: Secondary | ICD-10-CM | POA: Diagnosis not present

## 2017-03-15 DIAGNOSIS — N186 End stage renal disease: Secondary | ICD-10-CM | POA: Diagnosis not present

## 2017-03-15 DIAGNOSIS — N2581 Secondary hyperparathyroidism of renal origin: Secondary | ICD-10-CM | POA: Diagnosis not present

## 2017-03-15 DIAGNOSIS — D631 Anemia in chronic kidney disease: Secondary | ICD-10-CM | POA: Diagnosis not present

## 2017-03-15 DIAGNOSIS — T8610 Unspecified complication of kidney transplant: Secondary | ICD-10-CM | POA: Diagnosis not present

## 2017-03-15 DIAGNOSIS — Z992 Dependence on renal dialysis: Secondary | ICD-10-CM | POA: Diagnosis not present

## 2017-03-15 DIAGNOSIS — D689 Coagulation defect, unspecified: Secondary | ICD-10-CM | POA: Diagnosis not present

## 2017-03-20 DIAGNOSIS — D631 Anemia in chronic kidney disease: Secondary | ICD-10-CM | POA: Diagnosis not present

## 2017-03-20 DIAGNOSIS — N2581 Secondary hyperparathyroidism of renal origin: Secondary | ICD-10-CM | POA: Diagnosis not present

## 2017-03-20 DIAGNOSIS — N186 End stage renal disease: Secondary | ICD-10-CM | POA: Diagnosis not present

## 2017-03-22 DIAGNOSIS — D631 Anemia in chronic kidney disease: Secondary | ICD-10-CM | POA: Diagnosis not present

## 2017-03-22 DIAGNOSIS — N2581 Secondary hyperparathyroidism of renal origin: Secondary | ICD-10-CM | POA: Diagnosis not present

## 2017-03-22 DIAGNOSIS — N186 End stage renal disease: Secondary | ICD-10-CM | POA: Diagnosis not present

## 2017-03-24 DIAGNOSIS — N2581 Secondary hyperparathyroidism of renal origin: Secondary | ICD-10-CM | POA: Diagnosis not present

## 2017-03-24 DIAGNOSIS — N186 End stage renal disease: Secondary | ICD-10-CM | POA: Diagnosis not present

## 2017-03-24 DIAGNOSIS — D631 Anemia in chronic kidney disease: Secondary | ICD-10-CM | POA: Diagnosis not present

## 2017-03-27 DIAGNOSIS — N2581 Secondary hyperparathyroidism of renal origin: Secondary | ICD-10-CM | POA: Diagnosis not present

## 2017-03-27 DIAGNOSIS — N186 End stage renal disease: Secondary | ICD-10-CM | POA: Diagnosis not present

## 2017-03-27 DIAGNOSIS — D631 Anemia in chronic kidney disease: Secondary | ICD-10-CM | POA: Diagnosis not present

## 2017-03-29 DIAGNOSIS — D631 Anemia in chronic kidney disease: Secondary | ICD-10-CM | POA: Diagnosis not present

## 2017-03-29 DIAGNOSIS — N186 End stage renal disease: Secondary | ICD-10-CM | POA: Diagnosis not present

## 2017-03-29 DIAGNOSIS — N2581 Secondary hyperparathyroidism of renal origin: Secondary | ICD-10-CM | POA: Diagnosis not present

## 2017-03-31 DIAGNOSIS — N186 End stage renal disease: Secondary | ICD-10-CM | POA: Diagnosis not present

## 2017-03-31 DIAGNOSIS — D631 Anemia in chronic kidney disease: Secondary | ICD-10-CM | POA: Diagnosis not present

## 2017-03-31 DIAGNOSIS — N2581 Secondary hyperparathyroidism of renal origin: Secondary | ICD-10-CM | POA: Diagnosis not present

## 2017-04-03 DIAGNOSIS — D631 Anemia in chronic kidney disease: Secondary | ICD-10-CM | POA: Diagnosis not present

## 2017-04-03 DIAGNOSIS — N2581 Secondary hyperparathyroidism of renal origin: Secondary | ICD-10-CM | POA: Diagnosis not present

## 2017-04-03 DIAGNOSIS — N186 End stage renal disease: Secondary | ICD-10-CM | POA: Diagnosis not present

## 2017-04-05 ENCOUNTER — Emergency Department
Admission: EM | Admit: 2017-04-05 | Discharge: 2017-04-05 | Disposition: A | Discharge status: Home / Self Care | Payer: Medicare Other | Attending: Emergency Medicine | Admitting: Emergency Medicine

## 2017-04-05 ENCOUNTER — Emergency Department: Payer: Medicare Other

## 2017-04-05 DIAGNOSIS — Z7982 Long term (current) use of aspirin: Secondary | ICD-10-CM | POA: Diagnosis not present

## 2017-04-05 DIAGNOSIS — Z452 Encounter for adjustment and management of vascular access device: Secondary | ICD-10-CM | POA: Diagnosis not present

## 2017-04-05 DIAGNOSIS — N189 Chronic kidney disease, unspecified: Secondary | ICD-10-CM | POA: Diagnosis not present

## 2017-04-05 DIAGNOSIS — T829XXA Unspecified complication of cardiac and vascular prosthetic device, implant and graft, initial encounter: Secondary | ICD-10-CM

## 2017-04-05 DIAGNOSIS — Z79899 Other long term (current) drug therapy: Secondary | ICD-10-CM | POA: Diagnosis not present

## 2017-04-05 DIAGNOSIS — J45909 Unspecified asthma, uncomplicated: Secondary | ICD-10-CM | POA: Diagnosis not present

## 2017-04-05 DIAGNOSIS — T82898A Other specified complication of vascular prosthetic devices, implants and grafts, initial encounter: Secondary | ICD-10-CM | POA: Diagnosis not present

## 2017-04-05 DIAGNOSIS — I129 Hypertensive chronic kidney disease with stage 1 through stage 4 chronic kidney disease, or unspecified chronic kidney disease: Secondary | ICD-10-CM | POA: Insufficient documentation

## 2017-04-05 DIAGNOSIS — S21102A Unspecified open wound of left front wall of thorax without penetration into thoracic cavity, initial encounter: Secondary | ICD-10-CM | POA: Diagnosis not present

## 2017-04-05 DIAGNOSIS — Z992 Dependence on renal dialysis: Secondary | ICD-10-CM

## 2017-04-05 DIAGNOSIS — I120 Hypertensive chronic kidney disease with stage 5 chronic kidney disease or end stage renal disease: Secondary | ICD-10-CM | POA: Diagnosis not present

## 2017-04-05 DIAGNOSIS — Z940 Kidney transplant status: Secondary | ICD-10-CM | POA: Insufficient documentation

## 2017-04-05 DIAGNOSIS — N186 End stage renal disease: Secondary | ICD-10-CM

## 2017-04-05 MED ORDER — LIDOCAINE HCL (PF) 1 % IJ SOLN
INTRAMUSCULAR | Status: AC
  Administered 2017-04-05: 5 mL via INTRADERMAL
  Filled 2017-04-05: qty 5

## 2017-04-05 MED ORDER — LIDOCAINE HCL (PF) 1 % IJ SOLN
5.0000 mL | Freq: Once | INTRAMUSCULAR | Status: AC
  Administered 2017-04-05: 5 mL via INTRADERMAL

## 2017-04-05 NOTE — ED Notes (Signed)
NAD noted at time of D/C. Pt denies questions or concerns. Pt ambulatory to the lobby at this time.  

## 2017-04-05 NOTE — ED Triage Notes (Signed)
Patient ambulatory to triage with steady gait, without difficulty or distress noted; sent over by dialysis center to verify access of cath; st was placed in March, stitches came out in May, dialysis was used on Tuesday with no problems and pt is unsure of why they would not use it

## 2017-04-05 NOTE — Discharge Instructions (Addendum)
°  Dg Chest 2 View  Result Date: 04/05/2017 CLINICAL DATA:  PermCath position EXAM: CHEST  2 VIEW COMPARISON:  12/17/2016 FINDINGS: Right subclavian central venous catheter tip at the cavoatrial junction unchanged from the prior study Left jugular dual-lumen catheter as per replaced and tip now in the right atrium. No pneumothorax. Chronic pleuroparenchymal scarring in the right lung base unchanged. Negative for edema. IMPRESSION: Left jugular catheter placed with the tips in the right atrium. No pneumothorax. Electronically Signed   By: Franchot Gallo M.D.   On: 04/05/2017 07:16

## 2017-04-05 NOTE — ED Provider Notes (Signed)
[facility] Emergency Department Provider Note  ____________________________________________  Time seen: Approximately 6:57 AM  I have reviewed the triage vital signs and the nursing notes.   HISTORY  Chief Complaint Vascular Access Problem    HPI Jonathon Conley is a 51 y.o. male comes to the ED for evaluation of his dialysis catheter. He has a tunneled left IJ catheter. It is been in for about 3 months, and he notes that thesutures anchoring it to the chest wall broke about a month ago. He has continued getting dialysis all at this time since then. However, when he went to dialysis this morning they're concerned that it might get a position and told him to come to the ED for evaluation first he denies any acute complaints. Has no chest pain shortness of breath dizziness or palpitations.     Past Medical History:  Diagnosis Date  . Anemia   . Asthma    childhood  . Blood transfusion without reported diagnosis   . Chronic kidney disease    Dialysis Tues, Thurs, Sat  . Colitis   . Diverticulosis 04/12/16   also seen: sigmoid and rectal erythema, path:   . Hemorrhoids 03/2016   bleeding at 04/12/16 colonoscopy, treated with  APC laser. cauterization  . Hyperlipidemia   . Hypertension   . OSA (obstructive sleep apnea) 03/27/2013  . Renal insufficiency      Patient Active Problem List   Diagnosis Date Noted  . SIRS (systemic inflammatory response syndrome) ([place]) 12/19/2016  . ESRD (end stage renal disease) ([place]) 12/19/2016  . Hyponatremia 12/19/2016  . Anemia of chronic disease 12/19/2016  . Postprocedural seroma of a musculoskeletal structure following other procedure 12/17/2016  . Hematochezia 04/08/2016  . HTN (hypertension) 04/08/2016  . Intravenous line infection ([place]) 04/08/2016  . Insomnia 01/04/2016  . Malignant hypertension 12/14/2015  . Symptomatic anemia 11/03/2015  . Chronic anemia 11/03/2015  . Thrombocytopenia ([place]) 11/03/2015  .  Pleural effusion on right 11/03/2015  . Shortness of breath 11/02/2015  . Sepsis ([place]) 09/23/2015  . ESRD on dialysis ([place]) 09/22/2015  . Accelerated hypertension 09/22/2015  . Leg edema, left   . HCAP (healthcare-associated pneumonia) 09/21/2015  . Bacteremia due to Gram-positive bacteria 08/20/2015  . Acute blood loss anemia 08/12/2015  . C. difficile colitis 08/12/2015  . Bleeding gastrointestinal 08/12/2015  . Acute-on-chronic renal failure ([place]) 08/10/2015  . H/O kidney transplant 08/08/2015  . Non compliance with medical treatment 08/08/2015  . History of anemia 08/08/2015  . AKI (acute kidney injury) ([place]) 02/14/2015  . Renal transplant recipient 02/14/2015  . Acute on chronic diastolic congestive heart failure ([place]) 02/14/2015  . Anemia 02/14/2015  . Hyperlipidemia   . OSA (obstructive sleep apnea) 03/27/2013     Past Surgical History:  Procedure Laterality Date  . APPLICATION OF WOUND VAC Right 12/13/2016   Procedure: APPLICATION OF WOUND VAC;  Surgeon: Algernon Huxley, MD;  Location: [facility] ORS;  Service: Vascular;  Laterality: Right;  . APPLICATION OF WOUND VAC Right 12/19/2016   Procedure: APPLICATION OF WOUND VAC;  Surgeon: Algernon Huxley, MD;  Location: [facility] ORS;  Service: Vascular;  Laterality: Right;  . COLONOSCOPY N/A 04/12/2016   Procedure: COLONOSCOPY;  Surgeon: Mauri Pole, MD;  Location: [place] ENDOSCOPY;  Service: Endoscopy;  Laterality: N/A;  . DIALYSIS/PERMA CATHETER INSERTION N/A 02/26/2017   Procedure: Dialysis/Perma Catheter Insertion;  Surgeon: Algernon Huxley, MD;  Location: [place] CV LAB;  Service: Cardiovascular;  Laterality: N/A;  . EXCHANGE  OF A DIALYSIS CATHETER  11/20/2016   Procedure: Exchange Of A Dialysis Catheter;  Surgeon: Algernon Huxley, MD;  Location: [place] CV LAB;  Service: Cardiovascular;;  . fistulagrams Right   . HEMATOMA EVACUATION Right 12/13/2016   Procedure: EVACUATION HEMATOMA;  Surgeon: Algernon Huxley, MD;  Location: [facility] ORS;   Service: Vascular;  Laterality: Right;  . HEMATOMA EVACUATION Right 12/19/2016   Procedure: EVACUATIONof seroma;  Surgeon: Algernon Huxley, MD;  Location: [facility] ORS;  Service: Vascular;  Laterality: Right;  . HOT HEMOSTASIS N/A 04/12/2016   Procedure: HOT HEMOSTASIS (ARGON PLASMA COAGULATION/BICAP);  Surgeon: Mauri Pole, MD;  Location: [facility] ENDOSCOPY;  Service: Endoscopy;  Laterality: N/A;  . KIDNEY TRANSPLANT    . PERIPHERAL VASCULAR CATHETERIZATION Bilateral 10/02/2016   Procedure: Upper Extremity Venography;  Surgeon: Algernon Huxley, MD;  Location: [place] CV LAB;  Service: Cardiovascular;  Laterality: Bilateral;  . PERIPHERAL VASCULAR CATHETERIZATION Right 11/08/2016   Procedure: DIALYSIS/PERMA CATHETER REMOVAL right Jugular;  Surgeon: Algernon Huxley, MD;  Location: [facility] ORS;  Service: Vascular;  Laterality: Right;  . PERIPHERAL VASCULAR CATHETERIZATION Left 11/08/2016   Procedure: DIALYSIS/PERMA CATHETER INSERTION left jugular with u/s guide and flouroscan;  Surgeon: Algernon Huxley, MD;  Location: [facility] ORS;  Service: Vascular;  Laterality: Left;  . PERIPHERAL VASCULAR CATHETERIZATION N/A 11/08/2016   Procedure: IVC FILTER INSERTION;  Surgeon: Algernon Huxley, MD;  Location: [facility] ORS;  Service: Vascular;  Laterality: N/A;  . perm a cath in Rt upper chest Right   . THORACENTESIS    . VASCULAR ACCESS DEVICE INSERTION Right 11/08/2016   Procedure: INSERTION OF HERO VASCULAR ACCESS DEVICE ( GRAFT );  Surgeon: Algernon Huxley, MD;  Location: [facility] ORS;  Service: Vascular;  Laterality: Right;     Prior to Admission medications   Medication Sig Start Date End Date Taking? Authorizing Provider  amLODipine (NORVASC) 10 MG tablet Take 10 mg by mouth daily.    [provider]  aspirin EC 81 MG tablet Take 81 mg by mouth daily.     [provider]  calcitRIOL (ROCALTROL) 0.5 MCG capsule Take 1 capsule (0.5 mcg total) by mouth Every Tuesday,Thursday,and Saturday with dialysis. 09/24/15   Orson Eva,  MD  calcium acetate (PHOSLO) 667 MG capsule Take 667 mg by mouth 3 (three) times daily with meals.    [provider]  carvedilol (COREG) 25 MG tablet Take 25 mg by mouth 2 (two) times daily.     [provider]  cephALEXin (KEFLEX) 500 MG capsule Take 1 capsule (500 mg total) by mouth daily. Patient not taking: Reported on 02/26/2017 12/19/16   Theodoro Grist, MD  cinacalcet (SENSIPAR) 30 MG tablet Take 30 mg by mouth 3 (three) times a week. Wyvonnia Lora, Sat after dialysis    [provider]  ferrous sulfate 325 (65 FE) MG tablet Take 325 mg by mouth 3 (three) times daily with meals. Reported on 04/09/2016    [provider]  hydrALAZINE (APRESOLINE) 50 MG tablet Take 25 mg by mouth 3 (three) times daily. Reported on 04/09/2016    [provider]  Mesalamine 800 MG TBEC Take 1 tablet (800 mg total) by mouth 3 (three) times daily. 05/03/16   Esterwood, Amy S, PA-C  oxyCODONE-acetaminophen (PERCOCET) 7.5-325 MG tablet Take 1 tablet by mouth every 4 (four) hours as needed for severe pain. Patient not taking: Reported on 02/26/2017 12/13/16   Algernon Huxley, MD  simvastatin (ZOCOR) 5 MG  tablet Take 1 tablet (5 mg total) by mouth at bedtime. 02/17/15   GhimireHenreitta Leber, MD     Allergies Lisinopril   Family History  Problem Relation Age of Onset  . Heart disease Mother   . Cancer Mother        ovarian  . Cancer Maternal Grandmother   . Cancer Maternal Grandfather   . Asthma Son   . Asthma Son     Social History Social History  Substance Use Topics  . Smoking status: Never Smoker  . Smokeless tobacco: Never Used  . Alcohol use No    Review of Systems  Constitutional:   No fever or chills.  ENT:   No sore throat. No rhinorrhea. Cardiovascular:   No chest pain or syncope. Respiratory:   No dyspnea or cough. Gastrointestinal:   Negative for abdominal pain, vomiting and diarrhea.  Musculoskeletal:   Negative for focal pain or swelling All other  systems reviewed and are negative except as documented above in ROS and HPI.  ____________________________________________   PHYSICAL EXAM:  VITAL SIGNS: ED Triage Vitals  Enc Vitals Group     BP 04/05/17 0627 (!) 157/100     Pulse Rate 04/05/17 0627 85     Resp 04/05/17 0627 18     Temp 04/05/17 0627 97.9 F (36.6 C)     Temp Source 04/05/17 0627 Oral     SpO2 04/05/17 0627 100 %     Weight 04/05/17 0626 177 lb 7.5 oz (80.5 kg)     Height 04/05/17 0626 5' 10"  (1.778 m)     Head Circumference --      Peak Flow --      Pain Score --      Pain Loc --      Pain Edu? --      Excl. in [place]? --     Vital signs reviewed, nursing assessments reviewed.   Constitutional:   Alert and oriented. Well appearing and in no distress. Eyes:   No scleral icterus.  EOMI. ENT   Head:   Normocephalic and atraumatic.     Neck:   No meningismus. Full ROM Hematological/Lymphatic/Immunilogical:   No cervical lymphadenopathy. Cardiovascular:   RRR. Symmetric bilateral radial and DP pulses.  No murmurs.  Respiratory:   Normal respiratory effort without tachypnea/retractions. Breath sounds are clear and equal bilaterally. No wheezes/rales/rhonchi. Musculoskeletal:   Normal range of motion in all extremities. No joint effusions.  No lower extremity tenderness.  No edema. Neurologic:   Normal speech and language.  Motor grossly intact. No gross focal neurologic deficits are appreciated.  Skin:    Skin is warm, dry and intact. Left anterior chest wall tunneled catheter present. Comparing the catheter hub to the location of round scars on the chest wall from prior suture location, catheter is in original position.  ____________________________________________    LABS (pertinent positives/negatives) (all labs ordered are listed, but only abnormal results are displayed) Labs Reviewed - No data to  display ____________________________________________   EKG    ____________________________________________    RADIOLOGY  No results found.  ____________________________________________   PROCEDURES Procedures Suturing of Indwelling catheter to secure 3-0 ethilon. 2 sutures Simple interrupted fashion Left anterior chest wall 1% lidocaine without epi local anesthesia.  No complications. Pt tolerated well.  ebl 0. Hemostatic afterward.  ____________________________________________   INITIAL IMPRESSION / ASSESSMENT AND PLAN / ED COURSE  Pertinent labs & imaging results that were available during my care of the patient were  reviewed by me and considered in my medical decision making (see chart for details).  Patient presents without acute complaints but for evaluation of his dialysis catheter to ensure it is safe to continue dialysis. No symptoms to suggest electrolyte disturbance or any need for emergent dialysis here in the hospital. We'll get a chest x-ray to confirm position. We'll offer to place new sutures to anchor his catheter, and then arrange for discharge. Patient will contact dialysis to see if he can get a makeup session today.  7:30am cxr neg. Catheter in position. Hub sutured to chest wall. Will DC, pt will do hd makeup.     ____________________________________________   FINAL CLINICAL IMPRESSION(S) / ED DIAGNOSES  Final diagnoses:  ESRD (end stage renal disease) on dialysis ([place])  Complication of vascular dialysis catheter, unspecified complication, initial encounter      New Prescriptions   No medications on file     Portions of this note were generated with dragon dictation software. Dictation errors may occur despite best attempts at proofreading.    Carrie Mew, MD 04/05/17 [phone]

## 2017-04-06 DIAGNOSIS — N2581 Secondary hyperparathyroidism of renal origin: Secondary | ICD-10-CM | POA: Diagnosis not present

## 2017-04-06 DIAGNOSIS — N186 End stage renal disease: Secondary | ICD-10-CM | POA: Diagnosis not present

## 2017-04-06 DIAGNOSIS — D631 Anemia in chronic kidney disease: Secondary | ICD-10-CM | POA: Diagnosis not present

## 2017-04-09 DIAGNOSIS — T82898A Other specified complication of vascular prosthetic devices, implants and grafts, initial encounter: Secondary | ICD-10-CM | POA: Diagnosis not present

## 2017-04-09 DIAGNOSIS — N186 End stage renal disease: Secondary | ICD-10-CM | POA: Diagnosis not present

## 2017-04-10 DIAGNOSIS — N186 End stage renal disease: Secondary | ICD-10-CM | POA: Diagnosis not present

## 2017-04-10 DIAGNOSIS — D631 Anemia in chronic kidney disease: Secondary | ICD-10-CM | POA: Diagnosis not present

## 2017-04-10 DIAGNOSIS — N2581 Secondary hyperparathyroidism of renal origin: Secondary | ICD-10-CM | POA: Diagnosis not present

## 2017-04-12 DIAGNOSIS — N2581 Secondary hyperparathyroidism of renal origin: Secondary | ICD-10-CM | POA: Diagnosis not present

## 2017-04-12 DIAGNOSIS — D631 Anemia in chronic kidney disease: Secondary | ICD-10-CM | POA: Diagnosis not present

## 2017-04-12 DIAGNOSIS — N186 End stage renal disease: Secondary | ICD-10-CM | POA: Diagnosis not present

## 2017-04-14 DIAGNOSIS — N186 End stage renal disease: Secondary | ICD-10-CM | POA: Diagnosis not present

## 2017-04-14 DIAGNOSIS — D631 Anemia in chronic kidney disease: Secondary | ICD-10-CM | POA: Diagnosis not present

## 2017-04-14 DIAGNOSIS — T8610 Unspecified complication of kidney transplant: Secondary | ICD-10-CM | POA: Diagnosis not present

## 2017-04-14 DIAGNOSIS — Z992 Dependence on renal dialysis: Secondary | ICD-10-CM | POA: Diagnosis not present

## 2017-04-14 DIAGNOSIS — N2581 Secondary hyperparathyroidism of renal origin: Secondary | ICD-10-CM | POA: Diagnosis not present

## 2017-04-17 DIAGNOSIS — D631 Anemia in chronic kidney disease: Secondary | ICD-10-CM | POA: Diagnosis not present

## 2017-04-17 DIAGNOSIS — N2581 Secondary hyperparathyroidism of renal origin: Secondary | ICD-10-CM | POA: Diagnosis not present

## 2017-04-17 DIAGNOSIS — N186 End stage renal disease: Secondary | ICD-10-CM | POA: Diagnosis not present

## 2017-04-21 DIAGNOSIS — D631 Anemia in chronic kidney disease: Secondary | ICD-10-CM | POA: Diagnosis not present

## 2017-04-21 DIAGNOSIS — N2581 Secondary hyperparathyroidism of renal origin: Secondary | ICD-10-CM | POA: Diagnosis not present

## 2017-04-21 DIAGNOSIS — N186 End stage renal disease: Secondary | ICD-10-CM | POA: Diagnosis not present

## 2017-04-24 DIAGNOSIS — N186 End stage renal disease: Secondary | ICD-10-CM | POA: Diagnosis not present

## 2017-04-24 DIAGNOSIS — N2581 Secondary hyperparathyroidism of renal origin: Secondary | ICD-10-CM | POA: Diagnosis not present

## 2017-04-24 DIAGNOSIS — D631 Anemia in chronic kidney disease: Secondary | ICD-10-CM | POA: Diagnosis not present

## 2017-04-26 DIAGNOSIS — N2581 Secondary hyperparathyroidism of renal origin: Secondary | ICD-10-CM | POA: Diagnosis not present

## 2017-04-26 DIAGNOSIS — N186 End stage renal disease: Secondary | ICD-10-CM | POA: Diagnosis not present

## 2017-04-26 DIAGNOSIS — D631 Anemia in chronic kidney disease: Secondary | ICD-10-CM | POA: Diagnosis not present

## 2017-04-28 DIAGNOSIS — N186 End stage renal disease: Secondary | ICD-10-CM | POA: Diagnosis not present

## 2017-04-28 DIAGNOSIS — N2581 Secondary hyperparathyroidism of renal origin: Secondary | ICD-10-CM | POA: Diagnosis not present

## 2017-04-28 DIAGNOSIS — D631 Anemia in chronic kidney disease: Secondary | ICD-10-CM | POA: Diagnosis not present

## 2017-05-01 DIAGNOSIS — N2581 Secondary hyperparathyroidism of renal origin: Secondary | ICD-10-CM | POA: Diagnosis not present

## 2017-05-01 DIAGNOSIS — N186 End stage renal disease: Secondary | ICD-10-CM | POA: Diagnosis not present

## 2017-05-01 DIAGNOSIS — D631 Anemia in chronic kidney disease: Secondary | ICD-10-CM | POA: Diagnosis not present

## 2017-05-03 DIAGNOSIS — N186 End stage renal disease: Secondary | ICD-10-CM | POA: Diagnosis not present

## 2017-05-03 DIAGNOSIS — N2581 Secondary hyperparathyroidism of renal origin: Secondary | ICD-10-CM | POA: Diagnosis not present

## 2017-05-03 DIAGNOSIS — D631 Anemia in chronic kidney disease: Secondary | ICD-10-CM | POA: Diagnosis not present

## 2017-05-05 DIAGNOSIS — N186 End stage renal disease: Secondary | ICD-10-CM | POA: Diagnosis not present

## 2017-05-05 DIAGNOSIS — N2581 Secondary hyperparathyroidism of renal origin: Secondary | ICD-10-CM | POA: Diagnosis not present

## 2017-05-05 DIAGNOSIS — D631 Anemia in chronic kidney disease: Secondary | ICD-10-CM | POA: Diagnosis not present

## 2017-05-08 DIAGNOSIS — N2581 Secondary hyperparathyroidism of renal origin: Secondary | ICD-10-CM | POA: Diagnosis not present

## 2017-05-08 DIAGNOSIS — D631 Anemia in chronic kidney disease: Secondary | ICD-10-CM | POA: Diagnosis not present

## 2017-05-08 DIAGNOSIS — N186 End stage renal disease: Secondary | ICD-10-CM | POA: Diagnosis not present

## 2017-05-10 DIAGNOSIS — D631 Anemia in chronic kidney disease: Secondary | ICD-10-CM | POA: Diagnosis not present

## 2017-05-10 DIAGNOSIS — N2581 Secondary hyperparathyroidism of renal origin: Secondary | ICD-10-CM | POA: Diagnosis not present

## 2017-05-10 DIAGNOSIS — N186 End stage renal disease: Secondary | ICD-10-CM | POA: Diagnosis not present

## 2017-05-15 DIAGNOSIS — N2581 Secondary hyperparathyroidism of renal origin: Secondary | ICD-10-CM | POA: Diagnosis not present

## 2017-05-15 DIAGNOSIS — T8610 Unspecified complication of kidney transplant: Secondary | ICD-10-CM | POA: Diagnosis not present

## 2017-05-15 DIAGNOSIS — Z992 Dependence on renal dialysis: Secondary | ICD-10-CM | POA: Diagnosis not present

## 2017-05-15 DIAGNOSIS — D631 Anemia in chronic kidney disease: Secondary | ICD-10-CM | POA: Diagnosis not present

## 2017-05-15 DIAGNOSIS — N186 End stage renal disease: Secondary | ICD-10-CM | POA: Diagnosis not present

## 2017-05-17 DIAGNOSIS — N186 End stage renal disease: Secondary | ICD-10-CM | POA: Diagnosis not present

## 2017-05-17 DIAGNOSIS — N2581 Secondary hyperparathyroidism of renal origin: Secondary | ICD-10-CM | POA: Diagnosis not present

## 2017-05-17 DIAGNOSIS — D631 Anemia in chronic kidney disease: Secondary | ICD-10-CM | POA: Diagnosis not present

## 2017-05-19 DIAGNOSIS — D631 Anemia in chronic kidney disease: Secondary | ICD-10-CM | POA: Diagnosis not present

## 2017-05-19 DIAGNOSIS — N2581 Secondary hyperparathyroidism of renal origin: Secondary | ICD-10-CM | POA: Diagnosis not present

## 2017-05-19 DIAGNOSIS — N186 End stage renal disease: Secondary | ICD-10-CM | POA: Diagnosis not present

## 2017-05-22 DIAGNOSIS — N2581 Secondary hyperparathyroidism of renal origin: Secondary | ICD-10-CM | POA: Diagnosis not present

## 2017-05-22 DIAGNOSIS — D631 Anemia in chronic kidney disease: Secondary | ICD-10-CM | POA: Diagnosis not present

## 2017-05-22 DIAGNOSIS — N186 End stage renal disease: Secondary | ICD-10-CM | POA: Diagnosis not present

## 2017-05-24 DIAGNOSIS — N2581 Secondary hyperparathyroidism of renal origin: Secondary | ICD-10-CM | POA: Diagnosis not present

## 2017-05-24 DIAGNOSIS — N186 End stage renal disease: Secondary | ICD-10-CM | POA: Diagnosis not present

## 2017-05-24 DIAGNOSIS — D631 Anemia in chronic kidney disease: Secondary | ICD-10-CM | POA: Diagnosis not present

## 2017-05-26 DIAGNOSIS — N2581 Secondary hyperparathyroidism of renal origin: Secondary | ICD-10-CM | POA: Diagnosis not present

## 2017-05-26 DIAGNOSIS — D631 Anemia in chronic kidney disease: Secondary | ICD-10-CM | POA: Diagnosis not present

## 2017-05-26 DIAGNOSIS — N186 End stage renal disease: Secondary | ICD-10-CM | POA: Diagnosis not present

## 2017-05-29 DIAGNOSIS — D631 Anemia in chronic kidney disease: Secondary | ICD-10-CM | POA: Diagnosis not present

## 2017-05-29 DIAGNOSIS — N186 End stage renal disease: Secondary | ICD-10-CM | POA: Diagnosis not present

## 2017-05-29 DIAGNOSIS — N2581 Secondary hyperparathyroidism of renal origin: Secondary | ICD-10-CM | POA: Diagnosis not present

## 2017-05-31 DIAGNOSIS — N186 End stage renal disease: Secondary | ICD-10-CM | POA: Diagnosis not present

## 2017-05-31 DIAGNOSIS — D631 Anemia in chronic kidney disease: Secondary | ICD-10-CM | POA: Diagnosis not present

## 2017-05-31 DIAGNOSIS — N2581 Secondary hyperparathyroidism of renal origin: Secondary | ICD-10-CM | POA: Diagnosis not present

## 2017-06-05 DIAGNOSIS — N186 End stage renal disease: Secondary | ICD-10-CM | POA: Diagnosis not present

## 2017-06-05 DIAGNOSIS — D631 Anemia in chronic kidney disease: Secondary | ICD-10-CM | POA: Diagnosis not present

## 2017-06-05 DIAGNOSIS — N2581 Secondary hyperparathyroidism of renal origin: Secondary | ICD-10-CM | POA: Diagnosis not present

## 2017-06-07 DIAGNOSIS — D631 Anemia in chronic kidney disease: Secondary | ICD-10-CM | POA: Diagnosis not present

## 2017-06-07 DIAGNOSIS — N2581 Secondary hyperparathyroidism of renal origin: Secondary | ICD-10-CM | POA: Diagnosis not present

## 2017-06-07 DIAGNOSIS — N186 End stage renal disease: Secondary | ICD-10-CM | POA: Diagnosis not present

## 2017-06-09 DIAGNOSIS — N186 End stage renal disease: Secondary | ICD-10-CM | POA: Diagnosis not present

## 2017-06-09 DIAGNOSIS — D631 Anemia in chronic kidney disease: Secondary | ICD-10-CM | POA: Diagnosis not present

## 2017-06-09 DIAGNOSIS — N2581 Secondary hyperparathyroidism of renal origin: Secondary | ICD-10-CM | POA: Diagnosis not present

## 2017-06-12 DIAGNOSIS — N186 End stage renal disease: Secondary | ICD-10-CM | POA: Diagnosis not present

## 2017-06-12 DIAGNOSIS — N2581 Secondary hyperparathyroidism of renal origin: Secondary | ICD-10-CM | POA: Diagnosis not present

## 2017-06-12 DIAGNOSIS — D631 Anemia in chronic kidney disease: Secondary | ICD-10-CM | POA: Diagnosis not present

## 2017-06-14 DIAGNOSIS — N2581 Secondary hyperparathyroidism of renal origin: Secondary | ICD-10-CM | POA: Diagnosis not present

## 2017-06-14 DIAGNOSIS — D631 Anemia in chronic kidney disease: Secondary | ICD-10-CM | POA: Diagnosis not present

## 2017-06-14 DIAGNOSIS — N186 End stage renal disease: Secondary | ICD-10-CM | POA: Diagnosis not present

## 2017-06-15 DIAGNOSIS — D682 Hereditary deficiency of other clotting factors: Secondary | ICD-10-CM | POA: Insufficient documentation

## 2017-06-15 DIAGNOSIS — Z992 Dependence on renal dialysis: Secondary | ICD-10-CM | POA: Diagnosis not present

## 2017-06-15 DIAGNOSIS — N186 End stage renal disease: Secondary | ICD-10-CM | POA: Diagnosis not present

## 2017-06-15 DIAGNOSIS — T8610 Unspecified complication of kidney transplant: Secondary | ICD-10-CM | POA: Diagnosis not present

## 2017-06-16 DIAGNOSIS — Z23 Encounter for immunization: Secondary | ICD-10-CM | POA: Diagnosis not present

## 2017-06-16 DIAGNOSIS — D509 Iron deficiency anemia, unspecified: Secondary | ICD-10-CM | POA: Diagnosis not present

## 2017-06-16 DIAGNOSIS — D631 Anemia in chronic kidney disease: Secondary | ICD-10-CM | POA: Diagnosis not present

## 2017-06-16 DIAGNOSIS — N186 End stage renal disease: Secondary | ICD-10-CM | POA: Diagnosis not present

## 2017-06-16 DIAGNOSIS — N2581 Secondary hyperparathyroidism of renal origin: Secondary | ICD-10-CM | POA: Diagnosis not present

## 2017-06-19 DIAGNOSIS — D631 Anemia in chronic kidney disease: Secondary | ICD-10-CM | POA: Diagnosis not present

## 2017-06-19 DIAGNOSIS — N2581 Secondary hyperparathyroidism of renal origin: Secondary | ICD-10-CM | POA: Diagnosis not present

## 2017-06-19 DIAGNOSIS — D509 Iron deficiency anemia, unspecified: Secondary | ICD-10-CM | POA: Diagnosis not present

## 2017-06-19 DIAGNOSIS — Z23 Encounter for immunization: Secondary | ICD-10-CM | POA: Diagnosis not present

## 2017-06-19 DIAGNOSIS — N186 End stage renal disease: Secondary | ICD-10-CM | POA: Diagnosis not present

## 2017-06-21 DIAGNOSIS — D509 Iron deficiency anemia, unspecified: Secondary | ICD-10-CM | POA: Diagnosis not present

## 2017-06-21 DIAGNOSIS — N2581 Secondary hyperparathyroidism of renal origin: Secondary | ICD-10-CM | POA: Diagnosis not present

## 2017-06-21 DIAGNOSIS — N186 End stage renal disease: Secondary | ICD-10-CM | POA: Diagnosis not present

## 2017-06-21 DIAGNOSIS — Z23 Encounter for immunization: Secondary | ICD-10-CM | POA: Diagnosis not present

## 2017-06-21 DIAGNOSIS — D631 Anemia in chronic kidney disease: Secondary | ICD-10-CM | POA: Diagnosis not present

## 2017-06-22 DIAGNOSIS — N186 End stage renal disease: Secondary | ICD-10-CM | POA: Diagnosis not present

## 2017-06-22 DIAGNOSIS — D631 Anemia in chronic kidney disease: Secondary | ICD-10-CM | POA: Diagnosis not present

## 2017-06-22 DIAGNOSIS — D509 Iron deficiency anemia, unspecified: Secondary | ICD-10-CM | POA: Diagnosis not present

## 2017-06-22 DIAGNOSIS — N2581 Secondary hyperparathyroidism of renal origin: Secondary | ICD-10-CM | POA: Diagnosis not present

## 2017-06-22 DIAGNOSIS — Z23 Encounter for immunization: Secondary | ICD-10-CM | POA: Diagnosis not present

## 2017-06-26 DIAGNOSIS — N2581 Secondary hyperparathyroidism of renal origin: Secondary | ICD-10-CM | POA: Diagnosis not present

## 2017-06-26 DIAGNOSIS — D631 Anemia in chronic kidney disease: Secondary | ICD-10-CM | POA: Diagnosis not present

## 2017-06-26 DIAGNOSIS — D509 Iron deficiency anemia, unspecified: Secondary | ICD-10-CM | POA: Diagnosis not present

## 2017-06-26 DIAGNOSIS — N186 End stage renal disease: Secondary | ICD-10-CM | POA: Diagnosis not present

## 2017-06-26 DIAGNOSIS — Z23 Encounter for immunization: Secondary | ICD-10-CM | POA: Diagnosis not present

## 2017-06-28 DIAGNOSIS — D509 Iron deficiency anemia, unspecified: Secondary | ICD-10-CM | POA: Diagnosis not present

## 2017-06-28 DIAGNOSIS — D631 Anemia in chronic kidney disease: Secondary | ICD-10-CM | POA: Diagnosis not present

## 2017-06-28 DIAGNOSIS — N186 End stage renal disease: Secondary | ICD-10-CM | POA: Diagnosis not present

## 2017-06-28 DIAGNOSIS — Z23 Encounter for immunization: Secondary | ICD-10-CM | POA: Diagnosis not present

## 2017-06-28 DIAGNOSIS — N2581 Secondary hyperparathyroidism of renal origin: Secondary | ICD-10-CM | POA: Diagnosis not present

## 2017-07-03 DIAGNOSIS — N186 End stage renal disease: Secondary | ICD-10-CM | POA: Diagnosis not present

## 2017-07-03 DIAGNOSIS — D509 Iron deficiency anemia, unspecified: Secondary | ICD-10-CM | POA: Diagnosis not present

## 2017-07-03 DIAGNOSIS — Z23 Encounter for immunization: Secondary | ICD-10-CM | POA: Diagnosis not present

## 2017-07-03 DIAGNOSIS — N2581 Secondary hyperparathyroidism of renal origin: Secondary | ICD-10-CM | POA: Diagnosis not present

## 2017-07-03 DIAGNOSIS — D631 Anemia in chronic kidney disease: Secondary | ICD-10-CM | POA: Diagnosis not present

## 2017-07-05 DIAGNOSIS — N186 End stage renal disease: Secondary | ICD-10-CM | POA: Diagnosis not present

## 2017-07-05 DIAGNOSIS — D631 Anemia in chronic kidney disease: Secondary | ICD-10-CM | POA: Diagnosis not present

## 2017-07-05 DIAGNOSIS — D509 Iron deficiency anemia, unspecified: Secondary | ICD-10-CM | POA: Diagnosis not present

## 2017-07-05 DIAGNOSIS — Z23 Encounter for immunization: Secondary | ICD-10-CM | POA: Diagnosis not present

## 2017-07-05 DIAGNOSIS — N2581 Secondary hyperparathyroidism of renal origin: Secondary | ICD-10-CM | POA: Diagnosis not present

## 2017-07-09 DIAGNOSIS — D631 Anemia in chronic kidney disease: Secondary | ICD-10-CM | POA: Diagnosis not present

## 2017-07-09 DIAGNOSIS — N186 End stage renal disease: Secondary | ICD-10-CM | POA: Diagnosis not present

## 2017-07-09 DIAGNOSIS — Z23 Encounter for immunization: Secondary | ICD-10-CM | POA: Diagnosis not present

## 2017-07-09 DIAGNOSIS — D509 Iron deficiency anemia, unspecified: Secondary | ICD-10-CM | POA: Diagnosis not present

## 2017-07-09 DIAGNOSIS — N2581 Secondary hyperparathyroidism of renal origin: Secondary | ICD-10-CM | POA: Diagnosis not present

## 2017-07-12 DIAGNOSIS — D631 Anemia in chronic kidney disease: Secondary | ICD-10-CM | POA: Diagnosis not present

## 2017-07-12 DIAGNOSIS — N2581 Secondary hyperparathyroidism of renal origin: Secondary | ICD-10-CM | POA: Diagnosis not present

## 2017-07-12 DIAGNOSIS — Z23 Encounter for immunization: Secondary | ICD-10-CM | POA: Diagnosis not present

## 2017-07-12 DIAGNOSIS — D509 Iron deficiency anemia, unspecified: Secondary | ICD-10-CM | POA: Diagnosis not present

## 2017-07-12 DIAGNOSIS — N186 End stage renal disease: Secondary | ICD-10-CM | POA: Diagnosis not present

## 2017-07-15 DIAGNOSIS — Z992 Dependence on renal dialysis: Secondary | ICD-10-CM | POA: Diagnosis not present

## 2017-07-15 DIAGNOSIS — E1122 Type 2 diabetes mellitus with diabetic chronic kidney disease: Secondary | ICD-10-CM | POA: Diagnosis not present

## 2017-07-15 DIAGNOSIS — N186 End stage renal disease: Secondary | ICD-10-CM | POA: Diagnosis not present

## 2017-07-17 DIAGNOSIS — N186 End stage renal disease: Secondary | ICD-10-CM | POA: Diagnosis not present

## 2017-07-17 DIAGNOSIS — N2581 Secondary hyperparathyroidism of renal origin: Secondary | ICD-10-CM | POA: Diagnosis not present

## 2017-07-17 DIAGNOSIS — D509 Iron deficiency anemia, unspecified: Secondary | ICD-10-CM | POA: Diagnosis not present

## 2017-07-19 DIAGNOSIS — N186 End stage renal disease: Secondary | ICD-10-CM | POA: Diagnosis not present

## 2017-07-19 DIAGNOSIS — D509 Iron deficiency anemia, unspecified: Secondary | ICD-10-CM | POA: Diagnosis not present

## 2017-07-19 DIAGNOSIS — N2581 Secondary hyperparathyroidism of renal origin: Secondary | ICD-10-CM | POA: Diagnosis not present

## 2017-07-21 DIAGNOSIS — D509 Iron deficiency anemia, unspecified: Secondary | ICD-10-CM | POA: Diagnosis not present

## 2017-07-21 DIAGNOSIS — N2581 Secondary hyperparathyroidism of renal origin: Secondary | ICD-10-CM | POA: Diagnosis not present

## 2017-07-21 DIAGNOSIS — N186 End stage renal disease: Secondary | ICD-10-CM | POA: Diagnosis not present

## 2017-07-24 DIAGNOSIS — N186 End stage renal disease: Secondary | ICD-10-CM | POA: Diagnosis not present

## 2017-07-24 DIAGNOSIS — N2581 Secondary hyperparathyroidism of renal origin: Secondary | ICD-10-CM | POA: Diagnosis not present

## 2017-07-24 DIAGNOSIS — D509 Iron deficiency anemia, unspecified: Secondary | ICD-10-CM | POA: Diagnosis not present

## 2017-07-26 DIAGNOSIS — N186 End stage renal disease: Secondary | ICD-10-CM | POA: Diagnosis not present

## 2017-07-26 DIAGNOSIS — D509 Iron deficiency anemia, unspecified: Secondary | ICD-10-CM | POA: Diagnosis not present

## 2017-07-26 DIAGNOSIS — N2581 Secondary hyperparathyroidism of renal origin: Secondary | ICD-10-CM | POA: Diagnosis not present

## 2017-07-27 DIAGNOSIS — D509 Iron deficiency anemia, unspecified: Secondary | ICD-10-CM | POA: Diagnosis not present

## 2017-07-27 DIAGNOSIS — N2581 Secondary hyperparathyroidism of renal origin: Secondary | ICD-10-CM | POA: Diagnosis not present

## 2017-07-27 DIAGNOSIS — N186 End stage renal disease: Secondary | ICD-10-CM | POA: Diagnosis not present

## 2017-07-31 DIAGNOSIS — N2581 Secondary hyperparathyroidism of renal origin: Secondary | ICD-10-CM | POA: Diagnosis not present

## 2017-07-31 DIAGNOSIS — N186 End stage renal disease: Secondary | ICD-10-CM | POA: Diagnosis not present

## 2017-07-31 DIAGNOSIS — D509 Iron deficiency anemia, unspecified: Secondary | ICD-10-CM | POA: Diagnosis not present

## 2017-08-02 DIAGNOSIS — N2581 Secondary hyperparathyroidism of renal origin: Secondary | ICD-10-CM | POA: Diagnosis not present

## 2017-08-02 DIAGNOSIS — N186 End stage renal disease: Secondary | ICD-10-CM | POA: Diagnosis not present

## 2017-08-02 DIAGNOSIS — D509 Iron deficiency anemia, unspecified: Secondary | ICD-10-CM | POA: Diagnosis not present

## 2017-08-04 DIAGNOSIS — N186 End stage renal disease: Secondary | ICD-10-CM | POA: Diagnosis not present

## 2017-08-04 DIAGNOSIS — N2581 Secondary hyperparathyroidism of renal origin: Secondary | ICD-10-CM | POA: Diagnosis not present

## 2017-08-04 DIAGNOSIS — D509 Iron deficiency anemia, unspecified: Secondary | ICD-10-CM | POA: Diagnosis not present

## 2017-08-07 DIAGNOSIS — D509 Iron deficiency anemia, unspecified: Secondary | ICD-10-CM | POA: Diagnosis not present

## 2017-08-07 DIAGNOSIS — N2581 Secondary hyperparathyroidism of renal origin: Secondary | ICD-10-CM | POA: Diagnosis not present

## 2017-08-07 DIAGNOSIS — N186 End stage renal disease: Secondary | ICD-10-CM | POA: Diagnosis not present

## 2017-08-10 DIAGNOSIS — N2581 Secondary hyperparathyroidism of renal origin: Secondary | ICD-10-CM | POA: Diagnosis not present

## 2017-08-10 DIAGNOSIS — D509 Iron deficiency anemia, unspecified: Secondary | ICD-10-CM | POA: Diagnosis not present

## 2017-08-10 DIAGNOSIS — N186 End stage renal disease: Secondary | ICD-10-CM | POA: Diagnosis not present

## 2017-08-14 DIAGNOSIS — N186 End stage renal disease: Secondary | ICD-10-CM | POA: Diagnosis not present

## 2017-08-14 DIAGNOSIS — N2581 Secondary hyperparathyroidism of renal origin: Secondary | ICD-10-CM | POA: Diagnosis not present

## 2017-08-14 DIAGNOSIS — D509 Iron deficiency anemia, unspecified: Secondary | ICD-10-CM | POA: Diagnosis not present

## 2017-08-15 DIAGNOSIS — N186 End stage renal disease: Secondary | ICD-10-CM | POA: Diagnosis not present

## 2017-08-15 DIAGNOSIS — T8610 Unspecified complication of kidney transplant: Secondary | ICD-10-CM | POA: Diagnosis not present

## 2017-08-15 DIAGNOSIS — Z992 Dependence on renal dialysis: Secondary | ICD-10-CM | POA: Diagnosis not present

## 2017-08-16 DIAGNOSIS — N2581 Secondary hyperparathyroidism of renal origin: Secondary | ICD-10-CM | POA: Diagnosis not present

## 2017-08-16 DIAGNOSIS — D631 Anemia in chronic kidney disease: Secondary | ICD-10-CM | POA: Diagnosis not present

## 2017-08-16 DIAGNOSIS — N186 End stage renal disease: Secondary | ICD-10-CM | POA: Diagnosis not present

## 2017-08-21 DIAGNOSIS — N186 End stage renal disease: Secondary | ICD-10-CM | POA: Diagnosis not present

## 2017-08-21 DIAGNOSIS — N2581 Secondary hyperparathyroidism of renal origin: Secondary | ICD-10-CM | POA: Diagnosis not present

## 2017-08-21 DIAGNOSIS — D631 Anemia in chronic kidney disease: Secondary | ICD-10-CM | POA: Diagnosis not present

## 2017-08-23 DIAGNOSIS — N2581 Secondary hyperparathyroidism of renal origin: Secondary | ICD-10-CM | POA: Diagnosis not present

## 2017-08-23 DIAGNOSIS — N186 End stage renal disease: Secondary | ICD-10-CM | POA: Diagnosis not present

## 2017-08-23 DIAGNOSIS — D631 Anemia in chronic kidney disease: Secondary | ICD-10-CM | POA: Diagnosis not present

## 2017-08-25 DIAGNOSIS — N186 End stage renal disease: Secondary | ICD-10-CM | POA: Diagnosis not present

## 2017-08-25 DIAGNOSIS — N2581 Secondary hyperparathyroidism of renal origin: Secondary | ICD-10-CM | POA: Diagnosis not present

## 2017-08-25 DIAGNOSIS — D631 Anemia in chronic kidney disease: Secondary | ICD-10-CM | POA: Diagnosis not present

## 2017-08-28 DIAGNOSIS — D631 Anemia in chronic kidney disease: Secondary | ICD-10-CM | POA: Diagnosis not present

## 2017-08-28 DIAGNOSIS — N186 End stage renal disease: Secondary | ICD-10-CM | POA: Diagnosis not present

## 2017-08-28 DIAGNOSIS — N2581 Secondary hyperparathyroidism of renal origin: Secondary | ICD-10-CM | POA: Diagnosis not present

## 2017-08-30 DIAGNOSIS — D631 Anemia in chronic kidney disease: Secondary | ICD-10-CM | POA: Diagnosis not present

## 2017-08-30 DIAGNOSIS — N2581 Secondary hyperparathyroidism of renal origin: Secondary | ICD-10-CM | POA: Diagnosis not present

## 2017-08-30 DIAGNOSIS — N186 End stage renal disease: Secondary | ICD-10-CM | POA: Diagnosis not present

## 2017-09-01 DIAGNOSIS — N2581 Secondary hyperparathyroidism of renal origin: Secondary | ICD-10-CM | POA: Diagnosis not present

## 2017-09-01 DIAGNOSIS — D631 Anemia in chronic kidney disease: Secondary | ICD-10-CM | POA: Diagnosis not present

## 2017-09-01 DIAGNOSIS — N186 End stage renal disease: Secondary | ICD-10-CM | POA: Diagnosis not present

## 2017-09-03 DIAGNOSIS — N186 End stage renal disease: Secondary | ICD-10-CM | POA: Diagnosis not present

## 2017-09-03 DIAGNOSIS — D631 Anemia in chronic kidney disease: Secondary | ICD-10-CM | POA: Diagnosis not present

## 2017-09-03 DIAGNOSIS — N2581 Secondary hyperparathyroidism of renal origin: Secondary | ICD-10-CM | POA: Diagnosis not present

## 2017-09-05 DIAGNOSIS — D631 Anemia in chronic kidney disease: Secondary | ICD-10-CM | POA: Diagnosis not present

## 2017-09-05 DIAGNOSIS — N2581 Secondary hyperparathyroidism of renal origin: Secondary | ICD-10-CM | POA: Diagnosis not present

## 2017-09-05 DIAGNOSIS — N186 End stage renal disease: Secondary | ICD-10-CM | POA: Diagnosis not present

## 2017-09-08 DIAGNOSIS — N2581 Secondary hyperparathyroidism of renal origin: Secondary | ICD-10-CM | POA: Diagnosis not present

## 2017-09-08 DIAGNOSIS — D631 Anemia in chronic kidney disease: Secondary | ICD-10-CM | POA: Diagnosis not present

## 2017-09-08 DIAGNOSIS — N186 End stage renal disease: Secondary | ICD-10-CM | POA: Diagnosis not present

## 2017-09-11 DIAGNOSIS — N186 End stage renal disease: Secondary | ICD-10-CM | POA: Diagnosis not present

## 2017-09-11 DIAGNOSIS — N2581 Secondary hyperparathyroidism of renal origin: Secondary | ICD-10-CM | POA: Diagnosis not present

## 2017-09-11 DIAGNOSIS — D631 Anemia in chronic kidney disease: Secondary | ICD-10-CM | POA: Diagnosis not present

## 2017-09-13 DIAGNOSIS — D631 Anemia in chronic kidney disease: Secondary | ICD-10-CM | POA: Diagnosis not present

## 2017-09-13 DIAGNOSIS — N186 End stage renal disease: Secondary | ICD-10-CM | POA: Diagnosis not present

## 2017-09-13 DIAGNOSIS — N2581 Secondary hyperparathyroidism of renal origin: Secondary | ICD-10-CM | POA: Diagnosis not present

## 2017-09-14 DIAGNOSIS — T8610 Unspecified complication of kidney transplant: Secondary | ICD-10-CM | POA: Diagnosis not present

## 2017-09-14 DIAGNOSIS — N186 End stage renal disease: Secondary | ICD-10-CM | POA: Diagnosis not present

## 2017-09-14 DIAGNOSIS — Z992 Dependence on renal dialysis: Secondary | ICD-10-CM | POA: Diagnosis not present

## 2017-09-15 DIAGNOSIS — N2581 Secondary hyperparathyroidism of renal origin: Secondary | ICD-10-CM | POA: Diagnosis not present

## 2017-09-15 DIAGNOSIS — N186 End stage renal disease: Secondary | ICD-10-CM | POA: Diagnosis not present

## 2017-09-18 DIAGNOSIS — N186 End stage renal disease: Secondary | ICD-10-CM | POA: Diagnosis not present

## 2017-09-18 DIAGNOSIS — N2581 Secondary hyperparathyroidism of renal origin: Secondary | ICD-10-CM | POA: Diagnosis not present

## 2017-09-20 DIAGNOSIS — N186 End stage renal disease: Secondary | ICD-10-CM | POA: Diagnosis not present

## 2017-09-20 DIAGNOSIS — N2581 Secondary hyperparathyroidism of renal origin: Secondary | ICD-10-CM | POA: Diagnosis not present

## 2017-09-21 DIAGNOSIS — N186 End stage renal disease: Secondary | ICD-10-CM | POA: Diagnosis not present

## 2017-09-21 DIAGNOSIS — N2581 Secondary hyperparathyroidism of renal origin: Secondary | ICD-10-CM | POA: Diagnosis not present

## 2017-09-25 DIAGNOSIS — N186 End stage renal disease: Secondary | ICD-10-CM | POA: Diagnosis not present

## 2017-09-25 DIAGNOSIS — N2581 Secondary hyperparathyroidism of renal origin: Secondary | ICD-10-CM | POA: Diagnosis not present

## 2017-09-27 DIAGNOSIS — N186 End stage renal disease: Secondary | ICD-10-CM | POA: Diagnosis not present

## 2017-09-27 DIAGNOSIS — N2581 Secondary hyperparathyroidism of renal origin: Secondary | ICD-10-CM | POA: Diagnosis not present

## 2017-10-02 DIAGNOSIS — N2581 Secondary hyperparathyroidism of renal origin: Secondary | ICD-10-CM | POA: Diagnosis not present

## 2017-10-02 DIAGNOSIS — N186 End stage renal disease: Secondary | ICD-10-CM | POA: Diagnosis not present

## 2017-10-04 DIAGNOSIS — N2581 Secondary hyperparathyroidism of renal origin: Secondary | ICD-10-CM | POA: Diagnosis not present

## 2017-10-04 DIAGNOSIS — N186 End stage renal disease: Secondary | ICD-10-CM | POA: Diagnosis not present

## 2017-10-06 DIAGNOSIS — N2581 Secondary hyperparathyroidism of renal origin: Secondary | ICD-10-CM | POA: Diagnosis not present

## 2017-10-06 DIAGNOSIS — N186 End stage renal disease: Secondary | ICD-10-CM | POA: Diagnosis not present

## 2017-10-08 DIAGNOSIS — N2581 Secondary hyperparathyroidism of renal origin: Secondary | ICD-10-CM | POA: Diagnosis not present

## 2017-10-08 DIAGNOSIS — N186 End stage renal disease: Secondary | ICD-10-CM | POA: Diagnosis not present

## 2017-10-11 DIAGNOSIS — N2581 Secondary hyperparathyroidism of renal origin: Secondary | ICD-10-CM | POA: Diagnosis not present

## 2017-10-11 DIAGNOSIS — N186 End stage renal disease: Secondary | ICD-10-CM | POA: Diagnosis not present

## 2017-10-13 DIAGNOSIS — N186 End stage renal disease: Secondary | ICD-10-CM | POA: Diagnosis not present

## 2017-10-13 DIAGNOSIS — N2581 Secondary hyperparathyroidism of renal origin: Secondary | ICD-10-CM | POA: Diagnosis not present

## 2017-10-15 DIAGNOSIS — N186 End stage renal disease: Secondary | ICD-10-CM | POA: Diagnosis not present

## 2017-10-15 DIAGNOSIS — N2581 Secondary hyperparathyroidism of renal origin: Secondary | ICD-10-CM | POA: Diagnosis not present

## 2017-10-15 DIAGNOSIS — Z992 Dependence on renal dialysis: Secondary | ICD-10-CM | POA: Diagnosis not present

## 2017-10-15 DIAGNOSIS — T8610 Unspecified complication of kidney transplant: Secondary | ICD-10-CM | POA: Diagnosis not present

## 2017-10-18 DIAGNOSIS — D509 Iron deficiency anemia, unspecified: Secondary | ICD-10-CM | POA: Diagnosis not present

## 2017-10-18 DIAGNOSIS — D631 Anemia in chronic kidney disease: Secondary | ICD-10-CM | POA: Diagnosis not present

## 2017-10-18 DIAGNOSIS — N2581 Secondary hyperparathyroidism of renal origin: Secondary | ICD-10-CM | POA: Diagnosis not present

## 2017-10-18 DIAGNOSIS — N186 End stage renal disease: Secondary | ICD-10-CM | POA: Diagnosis not present

## 2017-10-20 DIAGNOSIS — D509 Iron deficiency anemia, unspecified: Secondary | ICD-10-CM | POA: Diagnosis not present

## 2017-10-20 DIAGNOSIS — D631 Anemia in chronic kidney disease: Secondary | ICD-10-CM | POA: Diagnosis not present

## 2017-10-20 DIAGNOSIS — N2581 Secondary hyperparathyroidism of renal origin: Secondary | ICD-10-CM | POA: Diagnosis not present

## 2017-10-20 DIAGNOSIS — N186 End stage renal disease: Secondary | ICD-10-CM | POA: Diagnosis not present

## 2017-10-24 DIAGNOSIS — N186 End stage renal disease: Secondary | ICD-10-CM | POA: Diagnosis not present

## 2017-10-24 DIAGNOSIS — N2581 Secondary hyperparathyroidism of renal origin: Secondary | ICD-10-CM | POA: Diagnosis not present

## 2017-10-24 DIAGNOSIS — D631 Anemia in chronic kidney disease: Secondary | ICD-10-CM | POA: Diagnosis not present

## 2017-10-24 DIAGNOSIS — D509 Iron deficiency anemia, unspecified: Secondary | ICD-10-CM | POA: Diagnosis not present

## 2017-10-27 DIAGNOSIS — N2581 Secondary hyperparathyroidism of renal origin: Secondary | ICD-10-CM | POA: Diagnosis not present

## 2017-10-27 DIAGNOSIS — N186 End stage renal disease: Secondary | ICD-10-CM | POA: Diagnosis not present

## 2017-10-27 DIAGNOSIS — D509 Iron deficiency anemia, unspecified: Secondary | ICD-10-CM | POA: Diagnosis not present

## 2017-10-27 DIAGNOSIS — D631 Anemia in chronic kidney disease: Secondary | ICD-10-CM | POA: Diagnosis not present

## 2017-10-30 DIAGNOSIS — N186 End stage renal disease: Secondary | ICD-10-CM | POA: Diagnosis not present

## 2017-10-30 DIAGNOSIS — D509 Iron deficiency anemia, unspecified: Secondary | ICD-10-CM | POA: Diagnosis not present

## 2017-10-30 DIAGNOSIS — D631 Anemia in chronic kidney disease: Secondary | ICD-10-CM | POA: Diagnosis not present

## 2017-10-30 DIAGNOSIS — N2581 Secondary hyperparathyroidism of renal origin: Secondary | ICD-10-CM | POA: Diagnosis not present

## 2017-11-01 DIAGNOSIS — D631 Anemia in chronic kidney disease: Secondary | ICD-10-CM | POA: Diagnosis not present

## 2017-11-01 DIAGNOSIS — N2581 Secondary hyperparathyroidism of renal origin: Secondary | ICD-10-CM | POA: Diagnosis not present

## 2017-11-01 DIAGNOSIS — N186 End stage renal disease: Secondary | ICD-10-CM | POA: Diagnosis not present

## 2017-11-01 DIAGNOSIS — D509 Iron deficiency anemia, unspecified: Secondary | ICD-10-CM | POA: Diagnosis not present

## 2017-11-06 DIAGNOSIS — D631 Anemia in chronic kidney disease: Secondary | ICD-10-CM | POA: Diagnosis not present

## 2017-11-06 DIAGNOSIS — N2581 Secondary hyperparathyroidism of renal origin: Secondary | ICD-10-CM | POA: Diagnosis not present

## 2017-11-06 DIAGNOSIS — N186 End stage renal disease: Secondary | ICD-10-CM | POA: Diagnosis not present

## 2017-11-06 DIAGNOSIS — D509 Iron deficiency anemia, unspecified: Secondary | ICD-10-CM | POA: Diagnosis not present

## 2017-11-08 DIAGNOSIS — D509 Iron deficiency anemia, unspecified: Secondary | ICD-10-CM | POA: Diagnosis not present

## 2017-11-08 DIAGNOSIS — D631 Anemia in chronic kidney disease: Secondary | ICD-10-CM | POA: Diagnosis not present

## 2017-11-08 DIAGNOSIS — N186 End stage renal disease: Secondary | ICD-10-CM | POA: Diagnosis not present

## 2017-11-08 DIAGNOSIS — N2581 Secondary hyperparathyroidism of renal origin: Secondary | ICD-10-CM | POA: Diagnosis not present

## 2017-11-13 DIAGNOSIS — N2581 Secondary hyperparathyroidism of renal origin: Secondary | ICD-10-CM | POA: Diagnosis not present

## 2017-11-13 DIAGNOSIS — D631 Anemia in chronic kidney disease: Secondary | ICD-10-CM | POA: Diagnosis not present

## 2017-11-13 DIAGNOSIS — D509 Iron deficiency anemia, unspecified: Secondary | ICD-10-CM | POA: Diagnosis not present

## 2017-11-13 DIAGNOSIS — N186 End stage renal disease: Secondary | ICD-10-CM | POA: Diagnosis not present

## 2017-11-14 ENCOUNTER — Ambulatory Visit (INDEPENDENT_AMBULATORY_CARE_PROVIDER_SITE_OTHER): Payer: Medicare Other | Admitting: Emergency Medicine

## 2017-11-14 ENCOUNTER — Encounter: Payer: Self-pay | Admitting: Emergency Medicine

## 2017-11-14 ENCOUNTER — Other Ambulatory Visit: Payer: Self-pay

## 2017-11-14 VITALS — BP 178/118 | HR 77 | Temp 98.7°F | Resp 16 | Ht 70.0 in | Wt 185.0 lb

## 2017-11-14 DIAGNOSIS — Z992 Dependence on renal dialysis: Secondary | ICD-10-CM

## 2017-11-14 DIAGNOSIS — R05 Cough: Secondary | ICD-10-CM | POA: Diagnosis not present

## 2017-11-14 DIAGNOSIS — J22 Unspecified acute lower respiratory infection: Secondary | ICD-10-CM | POA: Insufficient documentation

## 2017-11-14 DIAGNOSIS — N186 End stage renal disease: Secondary | ICD-10-CM | POA: Diagnosis not present

## 2017-11-14 DIAGNOSIS — Z87898 Personal history of other specified conditions: Secondary | ICD-10-CM | POA: Insufficient documentation

## 2017-11-14 DIAGNOSIS — R059 Cough, unspecified: Secondary | ICD-10-CM | POA: Insufficient documentation

## 2017-11-14 DIAGNOSIS — I10 Essential (primary) hypertension: Secondary | ICD-10-CM | POA: Diagnosis not present

## 2017-11-14 MED ORDER — AZITHROMYCIN 250 MG PO TABS: ORAL_TABLET | ORAL | 0 refills | Status: DC

## 2017-11-14 NOTE — Patient Instructions (Addendum)
     IF you received an x-ray today, you will receive an invoice from [facility]. Please contact [facility] at [phone] with questions or concerns regarding your invoice.   IF you received labwork today, you will receive an invoice from [facility]. Please contact [facility] at [phone] with questions or concerns regarding your invoice.   Our billing staff will not be able to assist you with questions regarding bills from these companies.  You will be contacted with the lab results as soon as they are available. The fastest way to get your results is to activate your My Chart account. Instructions are located on the last page of this paperwork. If you have not heard from us regarding the results in 2 weeks, please contact this office.     Cough, Adult A cough helps to clear your throat and lungs. A cough may last only 2-3 weeks (acute), or it may last longer than 8 weeks (chronic). Many different things can cause a cough. A cough may be a sign of an illness or another medical condition. Follow these instructions at home:  Pay attention to any changes in your cough.  Take medicines only as told by your doctor. ? If you were prescribed an antibiotic medicine, take it as told by your doctor. Do not stop taking it even if you start to feel better. ? Talk with your doctor before you try using a cough medicine.  Drink enough fluid to keep your pee (urine) clear or pale yellow.  If the air is dry, use a cold steam vaporizer or humidifier in your home.  Stay away from things that make you cough at work or at home.  If your cough is worse at night, try using extra pillows to raise your head up higher while you sleep.  Do not smoke, and try not to be around smoke. If you need help quitting, ask your doctor.  Do not have caffeine.  Do not drink alcohol.  Rest as needed. Contact a doctor if:  You have new problems (symptoms).  You cough up yellow fluid  (pus).  Your cough does not get better after 2-3 weeks, or your cough gets worse.  Medicine does not help your cough and you are not sleeping well.  You have pain that gets worse or pain that is not helped with medicine.  You have a fever.  You are losing weight and you do not know why.  You have night sweats. Get help right away if:  You cough up blood.  You have trouble breathing.  Your heartbeat is very fast. This information is not intended to replace advice given to you by your health care provider. Make sure you discuss any questions you have with your health care provider. Document Released: 06/15/2011 Document Revised: 03/09/2016 Document Reviewed: 12/09/2014 Elsevier Interactive Patient Education  2018 [employer]  

## 2017-11-14 NOTE — Progress Notes (Signed)
Jonathon Conley 52 y.o.   Chief Complaint  Patient presents with  . Cough    yellow mucus x 3 days  . Fever    101.0 degrees    HISTORY OF PRESENT ILLNESS: This is a 52 y.o. male dialysis patient complaining of 3-day history of productive cough followed by fever yesterday.  Had dialysis last yesterday with no fever then.  Wife also sick with the same.  Denies any difficulty breathing, chest pain, nausea or vomiting.  Did not take blood pressure medications this morning.  Cough  This is a new problem. The current episode started in the past 7 days. The problem has been gradually worsening. The problem occurs every few minutes. The cough is productive of sputum. Associated symptoms include a fever and nasal congestion. Pertinent negatives include no chest pain, chills, eye redness, headaches, heartburn, hemoptysis, myalgias, rash, sore throat, shortness of breath or wheezing. He has tried nothing for the symptoms. There is no history of asthma or COPD. ESRD     Prior to Admission medications   Medication Sig Start Date End Date Taking? Authorizing Provider  amLODipine (NORVASC) 10 MG tablet Take 10 mg by mouth daily.   Yes [provider]  aspirin EC 81 MG tablet Take 81 mg by mouth daily.    Yes [provider]  calcitRIOL (ROCALTROL) 0.5 MCG capsule Take 1 capsule (0.5 mcg total) by mouth Every Tuesday,Thursday,and Saturday with dialysis. 09/24/15  Yes TatShanon Brow, MD  calcium acetate (PHOSLO) 667 MG capsule Take 667 mg by mouth 3 (three) times daily with meals.   Yes [provider]  carvedilol (COREG) 25 MG tablet Take 25 mg by mouth 2 (two) times daily.    Yes [provider]  cinacalcet (SENSIPAR) 30 MG tablet Take 30 mg by mouth 3 (three) times a week. Wyvonnia Lora, Sat after dialysis   Yes [provider]  ferrous sulfate 325 (65 FE) MG tablet Take 325 mg by mouth 3 (three) times daily with meals. Reported on 04/09/2016   Yes [provider]  Mesalamine 800 MG TBEC Take 1 tablet (800 mg total) by mouth 3 (three) times daily. 05/03/16  Yes Esterwood, Amy S, PA-C  simvastatin (ZOCOR) 5 MG tablet Take 1 tablet (5 mg total) by mouth at bedtime. 02/17/15  Yes Ghimire, Henreitta Leber, MD  spironolactone (ALDACTONE) 25 MG tablet Take 25 mg by mouth 2 (two) times daily.   Yes [provider]  hydrALAZINE (APRESOLINE) 25 MG tablet Take 25 mg by mouth 3 (three) times daily. 03/11/17   [provider]  oxyCODONE-acetaminophen (PERCOCET) 7.5-325 MG tablet Take 1 tablet by mouth every 4 (four) hours as needed for severe pain. Patient not taking: Reported on 11/14/2017 12/13/16   Algernon Huxley, MD    Allergies  Allergen Reactions  . Lisinopril Swelling    Facial swelling    Patient Active Problem List   Diagnosis Date Noted  . SIRS (systemic inflammatory response syndrome) ([place]) 12/19/2016  . ESRD (end stage renal disease) ([place]) 12/19/2016  . Hyponatremia 12/19/2016  . Anemia of chronic disease 12/19/2016  . Postprocedural seroma of a musculoskeletal structure following other procedure 12/17/2016  . Hematochezia 04/08/2016  . HTN (hypertension) 04/08/2016  . Intravenous line infection ([place]) 04/08/2016  . Insomnia 01/04/2016  . Malignant hypertension 12/14/2015  . Symptomatic anemia 11/03/2015  . Chronic anemia 11/03/2015  . Thrombocytopenia ([place]) 11/03/2015  . Pleural effusion on right 11/03/2015  . Shortness of breath 11/02/2015  .  Sepsis ([place]) 09/23/2015  . ESRD on dialysis ([place]) 09/22/2015  . Accelerated hypertension 09/22/2015  . Leg edema, left   . HCAP (healthcare-associated pneumonia) 09/21/2015  . Bacteremia due to Gram-positive bacteria 08/20/2015  . Acute blood loss anemia 08/12/2015  . C. difficile colitis 08/12/2015  . Bleeding gastrointestinal 08/12/2015  . Acute-on-chronic renal failure ([place]) 08/10/2015  . H/O kidney transplant 08/08/2015  . Non compliance with medical treatment 08/08/2015    . History of anemia 08/08/2015  . AKI (acute kidney injury) ([place]) 02/14/2015  . Renal transplant recipient 02/14/2015  . Acute on chronic diastolic congestive heart failure ([place]) 02/14/2015  . Anemia 02/14/2015  . Hyperlipidemia   . OSA (obstructive sleep apnea) 03/27/2013    Past Medical History:  Diagnosis Date  . Anemia   . Asthma    childhood  . Blood transfusion without reported diagnosis   . Chronic kidney disease    Dialysis Tues, Thurs, Sat  . Colitis   . Diverticulosis 04/12/16   also seen: sigmoid and rectal erythema, path:   . Hemorrhoids 03/2016   bleeding at 04/12/16 colonoscopy, treated with  APC laser. cauterization  . Hyperlipidemia   . Hypertension   . OSA (obstructive sleep apnea) 03/27/2013  . Renal insufficiency     Past Surgical History:  Procedure Laterality Date  . APPLICATION OF WOUND VAC Right 12/13/2016   Procedure: APPLICATION OF WOUND VAC;  Surgeon: Algernon Huxley, MD;  Location: [facility];  Service: Vascular;  Laterality: Right;  . APPLICATION OF WOUND VAC Right 12/19/2016   Procedure: APPLICATION OF WOUND VAC;  Surgeon: Algernon Huxley, MD;  Location: [facility];  Service: Vascular;  Laterality: Right;  . COLONOSCOPY N/A 04/12/2016   Procedure: COLONOSCOPY;  Surgeon: Mauri Pole, MD;  Location: [place];  Service: Endoscopy;  Laterality: N/A;  . DIALYSIS/PERMA CATHETER INSERTION N/A 02/26/2017   Procedure: Dialysis/Perma Catheter Insertion;  Surgeon: Algernon Huxley, MD;  Location: [place] [facility];  Service: Cardiovascular;  Laterality: N/A;  . EXCHANGE OF A DIALYSIS CATHETER  11/20/2016   Procedure: Exchange Of A Dialysis Catheter;  Surgeon: Algernon Huxley, MD;  Location: [place];  Service: Cardiovascular;;  . fistulagrams Right   . HEMATOMA EVACUATION Right 12/13/2016   Procedure: EVACUATION HEMATOMA;  Surgeon: Algernon Huxley, MD;  Location: [facility];  Service: Vascular;  Laterality: Right;  . HEMATOMA EVACUATION Right 12/19/2016   Procedure:  EVACUATIONof seroma;  Surgeon: Algernon Huxley, MD;  Location: [facility];  Service: Vascular;  Laterality: Right;  . HOT HEMOSTASIS N/A 04/12/2016   Procedure: HOT HEMOSTASIS (ARGON PLASMA COAGULATION/BICAP);  Surgeon: Mauri Pole, MD;  Location: [facility];  Service: Endoscopy;  Laterality: N/A;  . KIDNEY TRANSPLANT    . PERIPHERAL VASCULAR CATHETERIZATION Bilateral 10/02/2016   Procedure: Upper Extremity Venography;  Surgeon: Algernon Huxley, MD;  Location: [place] [facility];  Service: Cardiovascular;  Laterality: Bilateral;  . PERIPHERAL VASCULAR CATHETERIZATION Right 11/08/2016   Procedure: DIALYSIS/PERMA CATHETER REMOVAL right Jugular;  Surgeon: Algernon Huxley, MD;  Location: [facility];  Service: Vascular;  Laterality: Right;  . PERIPHERAL VASCULAR CATHETERIZATION Left 11/08/2016   Procedure: DIALYSIS/PERMA CATHETER INSERTION left jugular with u/s guide and flouroscan;  Surgeon: Algernon Huxley, MD;  Location: [facility];  Service: Vascular;  Laterality: Left;  . PERIPHERAL VASCULAR CATHETERIZATION N/A 11/08/2016   Procedure: IVC FILTER INSERTION;  Surgeon: Algernon Huxley, MD;  Location: [facility];  Service: Vascular;  Laterality: N/A;  .  perm a cath in Rt upper chest Right   . THORACENTESIS    . VASCULAR ACCESS DEVICE INSERTION Right 11/08/2016   Procedure: INSERTION OF HERO VASCULAR ACCESS DEVICE ( GRAFT );  Surgeon: Algernon Huxley, MD;  Location: [facility];  Service: Vascular;  Laterality: Right;    Social History   Socioeconomic History  . Marital status: Married    Spouse name: Not on file  . Number of children: Not on file  . Years of education: Not on file  . Highest education level: Not on file  Social Needs  . Financial resource strain: Not on file  . Food insecurity - worry: Not on file  . Food insecurity - inability: Not on file  . Transportation needs - medical: Not on file  . Transportation needs - non-medical: Not on file  Occupational History  . Not on file  Tobacco Use  . Smoking  status: Never Smoker  . Smokeless tobacco: Never Used  Substance and Sexual Activity  . Alcohol use: No  . Drug use: No  . Sexual activity: Not on file  Other Topics Concern  . Not on file  Social History Narrative  . Not on file    Family History  Problem Relation Age of Onset  . Heart disease Mother   . Cancer Mother        ovarian  . Cancer Maternal Grandmother   . Cancer Maternal Grandfather   . Asthma Son   . Asthma Son      Review of Systems  Constitutional: Positive for fever and malaise/fatigue. Negative for chills.  HENT: Negative for congestion, nosebleeds and sore throat.   Eyes: Negative for discharge and redness.  Respiratory: Positive for cough. Negative for hemoptysis, shortness of breath and wheezing.   Cardiovascular: Negative for chest pain and palpitations.  Gastrointestinal: Negative for abdominal pain, diarrhea, heartburn, nausea and vomiting.  Genitourinary: Negative for dysuria and hematuria.  Musculoskeletal: Negative.  Negative for back pain, myalgias and neck pain.  Skin: Negative for rash.  Neurological: Negative for dizziness and headaches.  Endo/Heme/Allergies: Negative.     Vitals:   11/14/17 1015 11/14/17 1021  BP: (!) 200/120 (!) 178/118  Pulse: 77   Resp: 16   Temp: 98.7 F (37.1 C)   SpO2: 97%     Physical Exam  Constitutional: He is oriented to person, place, and time. He appears well-developed and well-nourished.  HENT:  Head: Normocephalic and atraumatic.  Nose: Nose normal.  Mouth/Throat: Oropharynx is clear and moist.  Eyes: Conjunctivae and EOM are normal. Pupils are equal, round, and reactive to light.  Neck: Normal range of motion. Neck supple. No JVD present. No thyromegaly present.  Cardiovascular: Normal rate, regular rhythm and normal heart sounds.  Pulmonary/Chest: Effort normal and breath sounds normal.  Abdominal: Soft. Bowel sounds are normal. He exhibits no distension. There is no tenderness.    Musculoskeletal: Normal range of motion.  Lymphadenopathy:    He has no cervical adenopathy.  Neurological: He is alert and oriented to person, place, and time. No sensory deficit. He exhibits normal muscle tone.  Skin: Skin is warm and dry. Capillary refill takes less than 2 seconds. No rash noted.  Psychiatric: He has a normal mood and affect. His behavior is normal.  Vitals reviewed.   A total of 25 minutes was spent in the room with the patient, greater than 50% of which was in counseling/coordination of care.  ASSESSMENT & PLAN: Koben was seen today for  cough and fever.  Diagnoses and all orders for this visit:  Cough  Lower respiratory infection -     azithromycin (ZITHROMAX) 250 MG tablet; Sig as indicated  History of fever  ESRD on dialysis [facility])  Accelerated hypertension    Patient Instructions       IF you received an x-ray today, you will receive an invoice from [facility]. Please contact [facility] at [phone] with questions or concerns regarding your invoice.   IF you received labwork today, you will receive an invoice from [facility]. Please contact [facility] at [phone] with questions or concerns regarding your invoice.   Our billing staff will not be able to assist you with questions regarding bills from these companies.  You will be contacted with the lab results as soon as they are available. The fastest way to get your results is to activate your My Chart account. Instructions are located on the last page of this paperwork. If you have not heard from [place] regarding the results in 2 weeks, please contact this office.     Cough, Adult A cough helps to clear your throat and lungs. A cough may last only 2-3 weeks (acute), or it may last longer than 8 weeks (chronic). Many different things can cause a cough. A cough may be a sign of an illness or another medical condition. Follow these instructions at home:  Pay attention to any  changes in your cough.  Take medicines only as told by your doctor. ? If you were prescribed an antibiotic medicine, take it as told by your doctor. Do not stop taking it even if you start to feel better. ? Talk with your doctor before you try using a cough medicine.  Drink enough fluid to keep your pee (urine) clear or pale yellow.  If the air is dry, use a cold steam vaporizer or humidifier in your home.  Stay away from things that make you cough at work or at home.  If your cough is worse at night, try using extra pillows to raise your head up higher while you sleep.  Do not smoke, and try not to be around smoke. If you need help quitting, ask your doctor.  Do not have caffeine.  Do not drink alcohol.  Rest as needed. Contact a doctor if:  You have new problems (symptoms).  You cough up yellow fluid (pus).  Your cough does not get better after 2-3 weeks, or your cough gets worse.  Medicine does not help your cough and you are not sleeping well.  You have pain that gets worse or pain that is not helped with medicine.  You have a fever.  You are losing weight and you do not know why.  You have night sweats. Get help right away if:  You cough up blood.  You have trouble breathing.  Your heartbeat is very fast. This information is not intended to replace advice given to you by your health care provider. Make sure you discuss any questions you have with your health care provider. Document Released: 06/15/2011 Document Revised: 03/09/2016 Document Reviewed: 12/09/2014 Elsevier Interactive Patient Education  2018 [employer].       Agustina Caroli, MD [facility]

## 2017-11-14 NOTE — Addendum Note (Signed)
Addended by: Alfredia Ferguson A on: 11/14/2017 11:27 AM   Modules accepted: Orders

## 2017-11-15 DIAGNOSIS — T8610 Unspecified complication of kidney transplant: Secondary | ICD-10-CM | POA: Diagnosis not present

## 2017-11-15 DIAGNOSIS — Z992 Dependence on renal dialysis: Secondary | ICD-10-CM | POA: Diagnosis not present

## 2017-11-15 DIAGNOSIS — N186 End stage renal disease: Secondary | ICD-10-CM | POA: Diagnosis not present

## 2017-11-17 DIAGNOSIS — N2581 Secondary hyperparathyroidism of renal origin: Secondary | ICD-10-CM | POA: Diagnosis not present

## 2017-11-17 DIAGNOSIS — N186 End stage renal disease: Secondary | ICD-10-CM | POA: Diagnosis not present

## 2017-11-17 DIAGNOSIS — Z23 Encounter for immunization: Secondary | ICD-10-CM | POA: Diagnosis not present

## 2017-11-17 DIAGNOSIS — D509 Iron deficiency anemia, unspecified: Secondary | ICD-10-CM | POA: Diagnosis not present

## 2017-11-17 DIAGNOSIS — D631 Anemia in chronic kidney disease: Secondary | ICD-10-CM | POA: Diagnosis not present

## 2017-11-20 DIAGNOSIS — Z23 Encounter for immunization: Secondary | ICD-10-CM | POA: Diagnosis not present

## 2017-11-20 DIAGNOSIS — N186 End stage renal disease: Secondary | ICD-10-CM | POA: Diagnosis not present

## 2017-11-20 DIAGNOSIS — D509 Iron deficiency anemia, unspecified: Secondary | ICD-10-CM | POA: Diagnosis not present

## 2017-11-20 DIAGNOSIS — D631 Anemia in chronic kidney disease: Secondary | ICD-10-CM | POA: Diagnosis not present

## 2017-11-20 DIAGNOSIS — N2581 Secondary hyperparathyroidism of renal origin: Secondary | ICD-10-CM | POA: Diagnosis not present

## 2017-11-22 DIAGNOSIS — D631 Anemia in chronic kidney disease: Secondary | ICD-10-CM | POA: Diagnosis not present

## 2017-11-22 DIAGNOSIS — N2581 Secondary hyperparathyroidism of renal origin: Secondary | ICD-10-CM | POA: Diagnosis not present

## 2017-11-22 DIAGNOSIS — D509 Iron deficiency anemia, unspecified: Secondary | ICD-10-CM | POA: Diagnosis not present

## 2017-11-22 DIAGNOSIS — N186 End stage renal disease: Secondary | ICD-10-CM | POA: Diagnosis not present

## 2017-11-22 DIAGNOSIS — Z23 Encounter for immunization: Secondary | ICD-10-CM | POA: Diagnosis not present

## 2017-11-24 DIAGNOSIS — D631 Anemia in chronic kidney disease: Secondary | ICD-10-CM | POA: Diagnosis not present

## 2017-11-24 DIAGNOSIS — N2581 Secondary hyperparathyroidism of renal origin: Secondary | ICD-10-CM | POA: Diagnosis not present

## 2017-11-24 DIAGNOSIS — D509 Iron deficiency anemia, unspecified: Secondary | ICD-10-CM | POA: Diagnosis not present

## 2017-11-24 DIAGNOSIS — Z23 Encounter for immunization: Secondary | ICD-10-CM | POA: Diagnosis not present

## 2017-11-24 DIAGNOSIS — N186 End stage renal disease: Secondary | ICD-10-CM | POA: Diagnosis not present

## 2017-11-27 DIAGNOSIS — D509 Iron deficiency anemia, unspecified: Secondary | ICD-10-CM | POA: Diagnosis not present

## 2017-11-27 DIAGNOSIS — Z23 Encounter for immunization: Secondary | ICD-10-CM | POA: Diagnosis not present

## 2017-11-27 DIAGNOSIS — D631 Anemia in chronic kidney disease: Secondary | ICD-10-CM | POA: Diagnosis not present

## 2017-11-27 DIAGNOSIS — N2581 Secondary hyperparathyroidism of renal origin: Secondary | ICD-10-CM | POA: Diagnosis not present

## 2017-11-27 DIAGNOSIS — N186 End stage renal disease: Secondary | ICD-10-CM | POA: Diagnosis not present

## 2017-12-01 DIAGNOSIS — D509 Iron deficiency anemia, unspecified: Secondary | ICD-10-CM | POA: Diagnosis not present

## 2017-12-01 DIAGNOSIS — N2581 Secondary hyperparathyroidism of renal origin: Secondary | ICD-10-CM | POA: Diagnosis not present

## 2017-12-01 DIAGNOSIS — D631 Anemia in chronic kidney disease: Secondary | ICD-10-CM | POA: Diagnosis not present

## 2017-12-01 DIAGNOSIS — Z23 Encounter for immunization: Secondary | ICD-10-CM | POA: Diagnosis not present

## 2017-12-01 DIAGNOSIS — N186 End stage renal disease: Secondary | ICD-10-CM | POA: Diagnosis not present

## 2017-12-04 DIAGNOSIS — N186 End stage renal disease: Secondary | ICD-10-CM | POA: Diagnosis not present

## 2017-12-04 DIAGNOSIS — Z23 Encounter for immunization: Secondary | ICD-10-CM | POA: Diagnosis not present

## 2017-12-04 DIAGNOSIS — N2581 Secondary hyperparathyroidism of renal origin: Secondary | ICD-10-CM | POA: Diagnosis not present

## 2017-12-04 DIAGNOSIS — D631 Anemia in chronic kidney disease: Secondary | ICD-10-CM | POA: Diagnosis not present

## 2017-12-04 DIAGNOSIS — D509 Iron deficiency anemia, unspecified: Secondary | ICD-10-CM | POA: Diagnosis not present

## 2017-12-06 DIAGNOSIS — N2581 Secondary hyperparathyroidism of renal origin: Secondary | ICD-10-CM | POA: Diagnosis not present

## 2017-12-06 DIAGNOSIS — D631 Anemia in chronic kidney disease: Secondary | ICD-10-CM | POA: Diagnosis not present

## 2017-12-06 DIAGNOSIS — Z23 Encounter for immunization: Secondary | ICD-10-CM | POA: Diagnosis not present

## 2017-12-06 DIAGNOSIS — N186 End stage renal disease: Secondary | ICD-10-CM | POA: Diagnosis not present

## 2017-12-06 DIAGNOSIS — D509 Iron deficiency anemia, unspecified: Secondary | ICD-10-CM | POA: Diagnosis not present

## 2017-12-11 DIAGNOSIS — D631 Anemia in chronic kidney disease: Secondary | ICD-10-CM | POA: Diagnosis not present

## 2017-12-11 DIAGNOSIS — N186 End stage renal disease: Secondary | ICD-10-CM | POA: Diagnosis not present

## 2017-12-11 DIAGNOSIS — N2581 Secondary hyperparathyroidism of renal origin: Secondary | ICD-10-CM | POA: Diagnosis not present

## 2017-12-11 DIAGNOSIS — D509 Iron deficiency anemia, unspecified: Secondary | ICD-10-CM | POA: Diagnosis not present

## 2017-12-11 DIAGNOSIS — Z23 Encounter for immunization: Secondary | ICD-10-CM | POA: Diagnosis not present

## 2017-12-13 DIAGNOSIS — D509 Iron deficiency anemia, unspecified: Secondary | ICD-10-CM | POA: Diagnosis not present

## 2017-12-13 DIAGNOSIS — Z992 Dependence on renal dialysis: Secondary | ICD-10-CM | POA: Diagnosis not present

## 2017-12-13 DIAGNOSIS — T8610 Unspecified complication of kidney transplant: Secondary | ICD-10-CM | POA: Diagnosis not present

## 2017-12-13 DIAGNOSIS — D631 Anemia in chronic kidney disease: Secondary | ICD-10-CM | POA: Diagnosis not present

## 2017-12-13 DIAGNOSIS — N186 End stage renal disease: Secondary | ICD-10-CM | POA: Diagnosis not present

## 2017-12-13 DIAGNOSIS — N2581 Secondary hyperparathyroidism of renal origin: Secondary | ICD-10-CM | POA: Diagnosis not present

## 2017-12-13 DIAGNOSIS — Z23 Encounter for immunization: Secondary | ICD-10-CM | POA: Diagnosis not present

## 2017-12-15 DIAGNOSIS — D631 Anemia in chronic kidney disease: Secondary | ICD-10-CM | POA: Diagnosis not present

## 2017-12-15 DIAGNOSIS — N2581 Secondary hyperparathyroidism of renal origin: Secondary | ICD-10-CM | POA: Diagnosis not present

## 2017-12-15 DIAGNOSIS — D509 Iron deficiency anemia, unspecified: Secondary | ICD-10-CM | POA: Diagnosis not present

## 2017-12-15 DIAGNOSIS — N186 End stage renal disease: Secondary | ICD-10-CM | POA: Diagnosis not present

## 2017-12-18 DIAGNOSIS — N2581 Secondary hyperparathyroidism of renal origin: Secondary | ICD-10-CM | POA: Diagnosis not present

## 2017-12-18 DIAGNOSIS — D509 Iron deficiency anemia, unspecified: Secondary | ICD-10-CM | POA: Diagnosis not present

## 2017-12-18 DIAGNOSIS — D631 Anemia in chronic kidney disease: Secondary | ICD-10-CM | POA: Diagnosis not present

## 2017-12-18 DIAGNOSIS — N186 End stage renal disease: Secondary | ICD-10-CM | POA: Diagnosis not present

## 2017-12-20 DIAGNOSIS — N2581 Secondary hyperparathyroidism of renal origin: Secondary | ICD-10-CM | POA: Diagnosis not present

## 2017-12-20 DIAGNOSIS — D631 Anemia in chronic kidney disease: Secondary | ICD-10-CM | POA: Diagnosis not present

## 2017-12-20 DIAGNOSIS — D509 Iron deficiency anemia, unspecified: Secondary | ICD-10-CM | POA: Diagnosis not present

## 2017-12-20 DIAGNOSIS — N186 End stage renal disease: Secondary | ICD-10-CM | POA: Diagnosis not present

## 2017-12-21 DIAGNOSIS — N186 End stage renal disease: Secondary | ICD-10-CM | POA: Diagnosis not present

## 2017-12-21 DIAGNOSIS — D509 Iron deficiency anemia, unspecified: Secondary | ICD-10-CM | POA: Diagnosis not present

## 2017-12-21 DIAGNOSIS — N2581 Secondary hyperparathyroidism of renal origin: Secondary | ICD-10-CM | POA: Diagnosis not present

## 2017-12-21 DIAGNOSIS — D631 Anemia in chronic kidney disease: Secondary | ICD-10-CM | POA: Diagnosis not present

## 2017-12-25 DIAGNOSIS — N186 End stage renal disease: Secondary | ICD-10-CM | POA: Diagnosis not present

## 2017-12-25 DIAGNOSIS — D631 Anemia in chronic kidney disease: Secondary | ICD-10-CM | POA: Diagnosis not present

## 2017-12-25 DIAGNOSIS — N2581 Secondary hyperparathyroidism of renal origin: Secondary | ICD-10-CM | POA: Diagnosis not present

## 2017-12-25 DIAGNOSIS — D509 Iron deficiency anemia, unspecified: Secondary | ICD-10-CM | POA: Diagnosis not present

## 2017-12-27 DIAGNOSIS — N186 End stage renal disease: Secondary | ICD-10-CM | POA: Diagnosis not present

## 2017-12-27 DIAGNOSIS — N2581 Secondary hyperparathyroidism of renal origin: Secondary | ICD-10-CM | POA: Diagnosis not present

## 2017-12-27 DIAGNOSIS — D509 Iron deficiency anemia, unspecified: Secondary | ICD-10-CM | POA: Diagnosis not present

## 2017-12-27 DIAGNOSIS — D631 Anemia in chronic kidney disease: Secondary | ICD-10-CM | POA: Diagnosis not present

## 2017-12-29 DIAGNOSIS — N186 End stage renal disease: Secondary | ICD-10-CM | POA: Diagnosis not present

## 2017-12-29 DIAGNOSIS — D509 Iron deficiency anemia, unspecified: Secondary | ICD-10-CM | POA: Diagnosis not present

## 2017-12-29 DIAGNOSIS — D631 Anemia in chronic kidney disease: Secondary | ICD-10-CM | POA: Diagnosis not present

## 2017-12-29 DIAGNOSIS — N2581 Secondary hyperparathyroidism of renal origin: Secondary | ICD-10-CM | POA: Diagnosis not present

## 2018-01-01 DIAGNOSIS — N186 End stage renal disease: Secondary | ICD-10-CM | POA: Diagnosis not present

## 2018-01-01 DIAGNOSIS — D509 Iron deficiency anemia, unspecified: Secondary | ICD-10-CM | POA: Diagnosis not present

## 2018-01-01 DIAGNOSIS — N2581 Secondary hyperparathyroidism of renal origin: Secondary | ICD-10-CM | POA: Diagnosis not present

## 2018-01-01 DIAGNOSIS — D631 Anemia in chronic kidney disease: Secondary | ICD-10-CM | POA: Diagnosis not present

## 2018-01-03 DIAGNOSIS — D509 Iron deficiency anemia, unspecified: Secondary | ICD-10-CM | POA: Diagnosis not present

## 2018-01-03 DIAGNOSIS — D631 Anemia in chronic kidney disease: Secondary | ICD-10-CM | POA: Diagnosis not present

## 2018-01-03 DIAGNOSIS — N186 End stage renal disease: Secondary | ICD-10-CM | POA: Diagnosis not present

## 2018-01-03 DIAGNOSIS — N2581 Secondary hyperparathyroidism of renal origin: Secondary | ICD-10-CM | POA: Diagnosis not present

## 2018-01-07 DIAGNOSIS — D631 Anemia in chronic kidney disease: Secondary | ICD-10-CM | POA: Diagnosis not present

## 2018-01-07 DIAGNOSIS — N2581 Secondary hyperparathyroidism of renal origin: Secondary | ICD-10-CM | POA: Diagnosis not present

## 2018-01-07 DIAGNOSIS — D509 Iron deficiency anemia, unspecified: Secondary | ICD-10-CM | POA: Diagnosis not present

## 2018-01-07 DIAGNOSIS — N186 End stage renal disease: Secondary | ICD-10-CM | POA: Diagnosis not present

## 2018-01-09 DIAGNOSIS — N186 End stage renal disease: Secondary | ICD-10-CM | POA: Diagnosis not present

## 2018-01-09 DIAGNOSIS — D509 Iron deficiency anemia, unspecified: Secondary | ICD-10-CM | POA: Diagnosis not present

## 2018-01-09 DIAGNOSIS — D631 Anemia in chronic kidney disease: Secondary | ICD-10-CM | POA: Diagnosis not present

## 2018-01-09 DIAGNOSIS — N2581 Secondary hyperparathyroidism of renal origin: Secondary | ICD-10-CM | POA: Diagnosis not present

## 2018-01-11 DIAGNOSIS — D631 Anemia in chronic kidney disease: Secondary | ICD-10-CM | POA: Diagnosis not present

## 2018-01-11 DIAGNOSIS — D509 Iron deficiency anemia, unspecified: Secondary | ICD-10-CM | POA: Diagnosis not present

## 2018-01-11 DIAGNOSIS — N2581 Secondary hyperparathyroidism of renal origin: Secondary | ICD-10-CM | POA: Diagnosis not present

## 2018-01-11 DIAGNOSIS — N186 End stage renal disease: Secondary | ICD-10-CM | POA: Diagnosis not present

## 2018-01-13 DIAGNOSIS — T8610 Unspecified complication of kidney transplant: Secondary | ICD-10-CM | POA: Diagnosis not present

## 2018-01-13 DIAGNOSIS — Z992 Dependence on renal dialysis: Secondary | ICD-10-CM | POA: Diagnosis not present

## 2018-01-13 DIAGNOSIS — N186 End stage renal disease: Secondary | ICD-10-CM | POA: Diagnosis not present

## 2018-01-16 DIAGNOSIS — D631 Anemia in chronic kidney disease: Secondary | ICD-10-CM | POA: Diagnosis not present

## 2018-01-16 DIAGNOSIS — N2581 Secondary hyperparathyroidism of renal origin: Secondary | ICD-10-CM | POA: Diagnosis not present

## 2018-01-16 DIAGNOSIS — N186 End stage renal disease: Secondary | ICD-10-CM | POA: Diagnosis not present

## 2018-01-16 DIAGNOSIS — D509 Iron deficiency anemia, unspecified: Secondary | ICD-10-CM | POA: Diagnosis not present

## 2018-01-18 DIAGNOSIS — N2581 Secondary hyperparathyroidism of renal origin: Secondary | ICD-10-CM | POA: Diagnosis not present

## 2018-01-18 DIAGNOSIS — N186 End stage renal disease: Secondary | ICD-10-CM | POA: Diagnosis not present

## 2018-01-18 DIAGNOSIS — D509 Iron deficiency anemia, unspecified: Secondary | ICD-10-CM | POA: Diagnosis not present

## 2018-01-18 DIAGNOSIS — D631 Anemia in chronic kidney disease: Secondary | ICD-10-CM | POA: Diagnosis not present

## 2018-01-21 DIAGNOSIS — N186 End stage renal disease: Secondary | ICD-10-CM | POA: Diagnosis not present

## 2018-01-21 DIAGNOSIS — D631 Anemia in chronic kidney disease: Secondary | ICD-10-CM | POA: Diagnosis not present

## 2018-01-21 DIAGNOSIS — N2581 Secondary hyperparathyroidism of renal origin: Secondary | ICD-10-CM | POA: Diagnosis not present

## 2018-01-21 DIAGNOSIS — D509 Iron deficiency anemia, unspecified: Secondary | ICD-10-CM | POA: Diagnosis not present

## 2018-01-23 DIAGNOSIS — D509 Iron deficiency anemia, unspecified: Secondary | ICD-10-CM | POA: Diagnosis not present

## 2018-01-23 DIAGNOSIS — N186 End stage renal disease: Secondary | ICD-10-CM | POA: Diagnosis not present

## 2018-01-23 DIAGNOSIS — N2581 Secondary hyperparathyroidism of renal origin: Secondary | ICD-10-CM | POA: Diagnosis not present

## 2018-01-23 DIAGNOSIS — D631 Anemia in chronic kidney disease: Secondary | ICD-10-CM | POA: Diagnosis not present

## 2018-01-25 DIAGNOSIS — D631 Anemia in chronic kidney disease: Secondary | ICD-10-CM | POA: Diagnosis not present

## 2018-01-25 DIAGNOSIS — D509 Iron deficiency anemia, unspecified: Secondary | ICD-10-CM | POA: Diagnosis not present

## 2018-01-25 DIAGNOSIS — N2581 Secondary hyperparathyroidism of renal origin: Secondary | ICD-10-CM | POA: Diagnosis not present

## 2018-01-25 DIAGNOSIS — N186 End stage renal disease: Secondary | ICD-10-CM | POA: Diagnosis not present

## 2018-01-28 ENCOUNTER — Encounter: Payer: Self-pay | Admitting: Emergency Medicine

## 2018-01-28 ENCOUNTER — Other Ambulatory Visit: Payer: Self-pay

## 2018-01-28 ENCOUNTER — Emergency Department: Payer: Medicare Other

## 2018-01-28 ENCOUNTER — Emergency Department
Admission: EM | Admit: 2018-01-28 | Discharge: 2018-01-28 | Disposition: A | Discharge status: Home / Self Care | Payer: Medicare Other | Attending: Emergency Medicine | Admitting: Emergency Medicine

## 2018-01-28 DIAGNOSIS — B349 Viral infection, unspecified: Secondary | ICD-10-CM

## 2018-01-28 DIAGNOSIS — I132 Hypertensive heart and chronic kidney disease with heart failure and with stage 5 chronic kidney disease, or end stage renal disease: Secondary | ICD-10-CM | POA: Insufficient documentation

## 2018-01-28 DIAGNOSIS — R509 Fever, unspecified: Secondary | ICD-10-CM | POA: Diagnosis not present

## 2018-01-28 DIAGNOSIS — Z79899 Other long term (current) drug therapy: Secondary | ICD-10-CM | POA: Diagnosis not present

## 2018-01-28 DIAGNOSIS — Z7982 Long term (current) use of aspirin: Secondary | ICD-10-CM | POA: Insufficient documentation

## 2018-01-28 DIAGNOSIS — N186 End stage renal disease: Secondary | ICD-10-CM | POA: Insufficient documentation

## 2018-01-28 DIAGNOSIS — R1110 Vomiting, unspecified: Secondary | ICD-10-CM | POA: Diagnosis not present

## 2018-01-28 DIAGNOSIS — Z992 Dependence on renal dialysis: Secondary | ICD-10-CM | POA: Diagnosis not present

## 2018-01-28 DIAGNOSIS — I5032 Chronic diastolic (congestive) heart failure: Secondary | ICD-10-CM | POA: Diagnosis not present

## 2018-01-28 DIAGNOSIS — R531 Weakness: Secondary | ICD-10-CM | POA: Diagnosis not present

## 2018-01-28 DIAGNOSIS — D631 Anemia in chronic kidney disease: Secondary | ICD-10-CM | POA: Diagnosis not present

## 2018-01-28 DIAGNOSIS — D509 Iron deficiency anemia, unspecified: Secondary | ICD-10-CM | POA: Diagnosis not present

## 2018-01-28 DIAGNOSIS — N2581 Secondary hyperparathyroidism of renal origin: Secondary | ICD-10-CM | POA: Diagnosis not present

## 2018-01-28 DIAGNOSIS — J45909 Unspecified asthma, uncomplicated: Secondary | ICD-10-CM | POA: Insufficient documentation

## 2018-01-28 LAB — CBC WITH DIFFERENTIAL/PLATELET
BASOS ABS: 0 10*3/uL (ref 0–0.1)
BASOS PCT: 0 %
EOS PCT: 0 %
Eosinophils Absolute: 0 10*3/uL (ref 0–0.7)
HEMATOCRIT: 34.2 % — AB (ref 40.0–52.0)
Hemoglobin: 11.4 g/dL — ABNORMAL LOW (ref 13.0–18.0)
Lymphocytes Relative: 7 %
Lymphs Abs: 0.2 10*3/uL — ABNORMAL LOW (ref 1.0–3.6)
MCH: 29.6 pg (ref 26.0–34.0)
MCHC: 33.5 g/dL (ref 32.0–36.0)
MCV: 88.4 fL (ref 80.0–100.0)
MONO ABS: 0.4 10*3/uL (ref 0.2–1.0)
Monocytes Relative: 12 %
NEUTROS ABS: 2.6 10*3/uL (ref 1.4–6.5)
Neutrophils Relative %: 81 %
PLATELETS: 153 10*3/uL (ref 150–440)
RBC: 3.87 MIL/uL — ABNORMAL LOW (ref 4.40–5.90)
RDW: 18.9 % — AB (ref 11.5–14.5)
WBC: 3.2 10*3/uL — ABNORMAL LOW (ref 3.8–10.6)

## 2018-01-28 LAB — COMPREHENSIVE METABOLIC PANEL
ALBUMIN: 3.8 g/dL (ref 3.5–5.0)
ALT: 19 U/L (ref 17–63)
AST: 36 U/L (ref 15–41)
Alkaline Phosphatase: 63 U/L (ref 38–126)
Anion gap: 12 (ref 5–15)
BUN: 17 mg/dL (ref 6–20)
CHLORIDE: 94 mmol/L — AB (ref 101–111)
CO2: 27 mmol/L (ref 22–32)
Calcium: 8.1 mg/dL — ABNORMAL LOW (ref 8.9–10.3)
Creatinine, Ser: 8.22 mg/dL — ABNORMAL HIGH (ref 0.61–1.24)
GFR calc Af Amer: 8 mL/min — ABNORMAL LOW (ref >60)
GFR, EST NON AFRICAN AMERICAN: 7 mL/min — AB (ref >60)
Glucose, Bld: 99 mg/dL (ref 65–99)
POTASSIUM: 4.3 mmol/L (ref 3.5–5.1)
SODIUM: 133 mmol/L — AB (ref 135–145)
Total Bilirubin: 1.1 mg/dL (ref 0.3–1.2)
Total Protein: 8.1 g/dL (ref 6.5–8.1)

## 2018-01-28 LAB — LACTIC ACID, PLASMA: LACTIC ACID, VENOUS: 0.8 mmol/L (ref 0.5–1.9)

## 2018-01-28 LAB — INFLUENZA PANEL BY PCR (TYPE A & B)
INFLAPCR: NEGATIVE
INFLBPCR: NEGATIVE

## 2018-01-28 LAB — TROPONIN I: Troponin I: <0.03 ng/mL (ref <0.03)

## 2018-01-28 LAB — BRAIN NATRIURETIC PEPTIDE: B Natriuretic Peptide: 449 pg/mL — ABNORMAL HIGH (ref 0.0–100.0)

## 2018-01-28 MED ORDER — ONDANSETRON HCL 4 MG PO TABS: 4.0000 mg | ORAL_TABLET | Freq: Three times a day (TID) | ORAL | 0 refills | Status: AC | PRN

## 2018-01-28 MED ORDER — SODIUM CHLORIDE 0.9 % IV BOLUS
500.0000 mL | Freq: Once | INTRAVENOUS | Status: AC
  Administered 2018-01-28: 500 mL via INTRAVENOUS

## 2018-01-28 MED ORDER — ONDANSETRON HCL 4 MG/2ML IJ SOLN
INTRAMUSCULAR | Status: AC
  Filled 2018-01-28: qty 2

## 2018-01-28 MED ORDER — ACETAMINOPHEN 500 MG PO TABS
1000.0000 mg | ORAL_TABLET | Freq: Once | ORAL | Status: AC
  Administered 2018-01-28: 1000 mg via ORAL

## 2018-01-28 MED ORDER — ACETAMINOPHEN 500 MG PO TABS
ORAL_TABLET | ORAL | Status: AC
  Administered 2018-01-28: 1000 mg via ORAL
  Filled 2018-01-28: qty 2

## 2018-01-28 MED ORDER — ONDANSETRON 4 MG PO TBDP
4.0000 mg | ORAL_TABLET | Freq: Once | ORAL | Status: AC
  Administered 2018-01-28: 4 mg via ORAL
  Filled 2018-01-28: qty 1

## 2018-01-28 MED ORDER — ACETAMINOPHEN 325 MG PO TABS
650.0000 mg | ORAL_TABLET | Freq: Once | ORAL | Status: DC
  Filled 2018-01-28: qty 2

## 2018-01-28 NOTE — ED Notes (Signed)
Reviewed discharge instructions, follow-up care, and prescriptions with patient. Patient verbalized understanding of all information reviewed. Patient stable, with no distress noted at this time.    

## 2018-01-28 NOTE — Discharge Instructions (Addendum)
You may use the prescribed Zofran for nausea or vomiting, and you can take Tylenol for fever or body aches.  Go to your dialysis on Wednesday as scheduled, and follow-up with your regular doctor as needed.  Return to the ER for new, worsening, or persistent vomiting, abdominal pain, fever, weakness, difficulty breathing, or any other new or worsening symptoms that concern you.

## 2018-01-28 NOTE — ED Triage Notes (Signed)
Pt to triage via w/c, appears uncomfortable; Dialysis today; came home at 10am with fever, chills, and vomiting and weakness

## 2018-01-28 NOTE — ED Provider Notes (Signed)
[facility] Emergency Department Provider Note ____________________________________________   First MD Initiated Contact with Patient 01/28/18 1952     (approximate)  I have reviewed the triage vital signs and the nursing notes.   HISTORY  Chief Complaint Fever    HPI Jonathon Conley is a 52 y.o. male with PMH as noted below including ESRD on dialysis who presents with fever and generalized weakness, gradual onset since about 10 AM today after dialysis, worsening throughout the day, and associated with vomiting.  Patient also reports cramping in his upper abdomen which is similar to how he feels sometimes when too much fluid was taken off.  He denies diarrhea.  He denies chest pain, cough, or shortness of breath.  He states that he makes minimal urine and denies any acute urinary symptoms.  No sick contacts or other recent illness.  He states that he was asleep for some of the dialysis but it seemed proceeded normally.   Past Medical History:  Diagnosis Date  . Anemia   . Asthma    childhood  . Blood transfusion without reported diagnosis   . Chronic kidney disease    Dialysis Tues, Thurs, Sat  . Colitis   . Diverticulosis 04/12/16   also seen: sigmoid and rectal erythema, path:   . Hemorrhoids 03/2016   bleeding at 04/12/16 colonoscopy, treated with  APC laser. cauterization  . Hyperlipidemia   . Hypertension   . OSA (obstructive sleep apnea) 03/27/2013  . Renal insufficiency     Patient Active Problem List   Diagnosis Date Noted  . Cough 11/14/2017  . Lower respiratory infection 11/14/2017  . History of fever 11/14/2017  . SIRS (systemic inflammatory response syndrome) ([place]) 12/19/2016  . ESRD (end stage renal disease) ([place]) 12/19/2016  . Hyponatremia 12/19/2016  . Anemia of chronic disease 12/19/2016  . Postprocedural seroma of a musculoskeletal structure following other procedure 12/17/2016  . Hematochezia 04/08/2016  . HTN (hypertension)  04/08/2016  . Intravenous line infection ([place]) 04/08/2016  . Insomnia 01/04/2016  . Malignant hypertension 12/14/2015  . Symptomatic anemia 11/03/2015  . Chronic anemia 11/03/2015  . Thrombocytopenia ([place]) 11/03/2015  . Pleural effusion on right 11/03/2015  . Shortness of breath 11/02/2015  . Sepsis ([place]) 09/23/2015  . ESRD on dialysis ([place]) 09/22/2015  . Accelerated hypertension 09/22/2015  . Leg edema, left   . HCAP (healthcare-associated pneumonia) 09/21/2015  . Bacteremia due to Gram-positive bacteria 08/20/2015  . Acute blood loss anemia 08/12/2015  . C. difficile colitis 08/12/2015  . Bleeding gastrointestinal 08/12/2015  . Acute-on-chronic renal failure ([place]) 08/10/2015  . H/O kidney transplant 08/08/2015  . Non compliance with medical treatment 08/08/2015  . History of anemia 08/08/2015  . AKI (acute kidney injury) ([place]) 02/14/2015  . Renal transplant recipient 02/14/2015  . Acute on chronic diastolic congestive heart failure ([place]) 02/14/2015  . Anemia 02/14/2015  . Hyperlipidemia   . OSA (obstructive sleep apnea) 03/27/2013    Past Surgical History:  Procedure Laterality Date  . APPLICATION OF WOUND VAC Right 12/13/2016   Procedure: APPLICATION OF WOUND VAC;  Surgeon: Algernon Huxley, MD;  Location: [facility] ORS;  Service: Vascular;  Laterality: Right;  . APPLICATION OF WOUND VAC Right 12/19/2016   Procedure: APPLICATION OF WOUND VAC;  Surgeon: Algernon Huxley, MD;  Location: [facility] OR[facility];  Service: Vascular;  Laterality: Right;  . COLONOSCOPY N/A 04/12/2016   Procedure: COLONOSCOPY;  Surgeon: Mauri Pole, MD;  Location: [place] [facility];  Service: Endoscopy;  Laterality:  N/A;  . DIALYSIS/PERMA CATHETER INSERTION N/A 02/26/2017   Procedure: Dialysis/Perma Catheter Insertion;  Surgeon: Algernon Huxley, MD;  Location: [place] [facility];  Service: Cardiovascular;  Laterality: N/A;  . EXCHANGE OF A DIALYSIS CATHETER  11/20/2016   Procedure: Exchange Of A Dialysis Catheter;  Surgeon: Algernon Huxley, MD;  Location: [place] [facility];  Service: Cardiovascular;;  . fistulagrams Right   . HEMATOMA EVACUATION Right 12/13/2016   Procedure: EVACUATION HEMATOMA;  Surgeon: Algernon Huxley, MD;  Location: [facility];  Service: Vascular;  Laterality: Right;  . HEMATOMA EVACUATION Right 12/19/2016   Procedure: EVACUATIONof seroma;  Surgeon: Algernon Huxley, MD;  Location: [facility];  Service: Vascular;  Laterality: Right;  . HOT HEMOSTASIS N/A 04/12/2016   Procedure: HOT HEMOSTASIS (ARGON PLASMA COAGULATION/BICAP);  Surgeon: Mauri Pole, MD;  Location: [facility];  Service: Endoscopy;  Laterality: N/A;  . KIDNEY TRANSPLANT    . PERIPHERAL VASCULAR CATHETERIZATION Bilateral 10/02/2016   Procedure: Upper Extremity Venography;  Surgeon: Algernon Huxley, MD;  Location: [place] [facility];  Service: Cardiovascular;  Laterality: Bilateral;  . PERIPHERAL VASCULAR CATHETERIZATION Right 11/08/2016   Procedure: DIALYSIS/PERMA CATHETER REMOVAL right Jugular;  Surgeon: Algernon Huxley, MD;  Location: [facility];  Service: Vascular;  Laterality: Right;  . PERIPHERAL VASCULAR CATHETERIZATION Left 11/08/2016   Procedure: DIALYSIS/PERMA CATHETER INSERTION left jugular with u/s guide and flouroscan;  Surgeon: Algernon Huxley, MD;  Location: [facility];  Service: Vascular;  Laterality: Left;  . PERIPHERAL VASCULAR CATHETERIZATION N/A 11/08/2016   Procedure: IVC FILTER INSERTION;  Surgeon: Algernon Huxley, MD;  Location: [facility];  Service: Vascular;  Laterality: N/A;  . perm a cath in Rt upper chest Right   . THORACENTESIS    . VASCULAR ACCESS DEVICE INSERTION Right 11/08/2016   Procedure: INSERTION OF HERO VASCULAR ACCESS DEVICE ( GRAFT );  Surgeon: Algernon Huxley, MD;  Location: [facility];  Service: Vascular;  Laterality: Right;    Prior to Admission medications   Medication Sig Start Date End Date Taking? Authorizing Provider  amLODipine (NORVASC) 10 MG tablet Take 10 mg by mouth daily.    [provider]  aspirin EC 81 MG  tablet Take 81 mg by mouth daily.     [provider]  azithromycin (ZITHROMAX) 250 MG tablet Sig as indicated 11/14/17   Horald Pollen, MD  calcitRIOL (ROCALTROL) 0.5 MCG capsule Take 1 capsule (0.5 mcg total) by mouth Every Tuesday,Thursday,and Saturday with dialysis. 09/24/15   Orson Eva, MD  calcium acetate (PHOSLO) 667 MG capsule Take 667 mg by mouth 3 (three) times daily with meals.    [provider]  carvedilol (COREG) 25 MG tablet Take 25 mg by mouth 2 (two) times daily.     [provider]  cinacalcet (SENSIPAR) 30 MG tablet Take 30 mg by mouth 3 (three) times a week. Wyvonnia Lora, Sat after dialysis    [provider]  ferrous sulfate 325 (65 FE) MG tablet Take 325 mg by mouth 3 (three) times daily with meals. Reported on 04/09/2016    [provider]  hydrALAZINE (APRESOLINE) 25 MG tablet Take 25 mg by mouth 3 (three) times daily. 03/11/17   [provider]  Mesalamine 800 MG TBEC Take 1 tablet (800 mg total) by mouth 3 (three) times daily. 05/03/16   Esterwood, Amy S, PA-C  oxyCODONE-acetaminophen (PERCOCET) 7.5-325 MG tablet Take 1 tablet by mouth every 4 (four) hours as needed for  severe pain. Patient not taking: Reported on 11/14/2017 12/13/16   Algernon Huxley, MD  simvastatin (ZOCOR) 5 MG tablet Take 1 tablet (5 mg total) by mouth at bedtime. 02/17/15   Ghimire, Henreitta Leber, MD  spironolactone (ALDACTONE) 25 MG tablet Take 25 mg by mouth 2 (two) times daily.    [provider]    Allergies Lisinopril  Family History  Problem Relation Age of Onset  . Heart disease Mother   . Cancer Mother        ovarian  . Cancer Maternal Grandmother   . Cancer Maternal Grandfather   . Asthma Son   . Asthma Son     Social History Social History   Tobacco Use  . Smoking status: Never Smoker  . Smokeless tobacco: Never Used  Substance Use Topics  . Alcohol use: No  . Drug use: No    Review of Systems  Constitutional:  Positive for fever. Eyes: No redness. ENT: No sore throat. Cardiovascular: Denies chest pain. Respiratory: Denies shortness of breath. Gastrointestinal: Positive for vomiting.  No diarrhea.  Genitourinary: Negative for dysuria.  Musculoskeletal: Negative for back pain. Skin: Negative for rash. Neurological: Negative for headache.   ____________________________________________   PHYSICAL EXAM:  VITAL SIGNS: ED Triage Vitals  Enc Vitals Group     BP 01/28/18 1933 (!) 158/92     Pulse Rate 01/28/18 1933 87     Resp 01/28/18 1933 18     Temp 01/28/18 1933 (!) 103.2 F (39.6 C)     Temp Source 01/28/18 1933 Oral     SpO2 01/28/18 1933 99 %     Weight 01/28/18 1931 182 lb 15.7 oz (83 kg)     Height 01/28/18 1931 5' 10"  (1.778 m)     Head Circumference --      Peak Flow --      Pain Score 01/28/18 1931 0     Pain Loc --      Pain Edu? --      Excl. in [place]? --     Constitutional: Alert and oriented. Well appearing and in no acute distress. Eyes: Conjunctivae are normal.  Head: Atraumatic. Nose: No congestion/rhinnorhea. Mouth/Throat: Mucous membranes are dry.   Neck: Normal range of motion.  Cardiovascular: Normal rate, regular rhythm. Grossly normal heart sounds.  Good peripheral circulation. Respiratory: Normal respiratory effort.  No retractions. Lungs CTAB. Gastrointestinal: Soft and nontender. No distention.  Genitourinary: No flank tenderness. Musculoskeletal: No lower extremity edema.  Extremities warm and well perfused.  Neurologic:  Normal speech and language. No gross focal neurologic deficits are appreciated.  Skin:  Skin is warm and dry. No rash noted. Psychiatric: Mood and affect are normal. Speech and behavior are normal.  ____________________________________________   LABS (all labs ordered are listed, but only abnormal results are displayed)  Labs Reviewed  COMPREHENSIVE METABOLIC PANEL - Abnormal; Notable for the following components:      Result  Value   Sodium 133 (*)    Chloride 94 (*)    Creatinine, Ser 8.22 (*)    Calcium 8.1 (*)    GFR calc non Af Amer 7 (*)    GFR calc Af Amer 8 (*)    All other components within normal limits  BRAIN NATRIURETIC PEPTIDE - Abnormal; Notable for the following components:   B Natriuretic Peptide 449.0 (*)    All other components within normal limits  CBC WITH DIFFERENTIAL/PLATELET - Abnormal; Notable for the following components:   WBC 3.2 (*)  RBC 3.87 (*)    Hemoglobin 11.4 (*)    HCT 34.2 (*)    RDW 18.9 (*)    Lymphs Abs 0.2 (*)    All other components within normal limits  CULTURE, BLOOD (ROUTINE X 2)  CULTURE, BLOOD (ROUTINE X 2)  LACTIC ACID, PLASMA  TROPONIN I  INFLUENZA PANEL BY PCR (TYPE A & B)  CBC WITH DIFFERENTIAL/PLATELET  URINALYSIS, ROUTINE W REFLEX MICROSCOPIC   ____________________________________________  EKG  ED ECG REPORT I, Arta Silence, the attending physician, personally viewed and interpreted this ECG.  Date: 01/28/2018 EKG Time: 2003 Rate: 85 Rhythm: normal sinus rhythm QRS Axis: normal Intervals: normal ST/T Wave abnormalities: LVH with repolarization abnormality Narrative Interpretation: no evidence of acute ischemia; no significant change when compared to EKG of 11/08/2016  ____________________________________________  RADIOLOGY  CXR: No focal infiltrate or other acute findings  ____________________________________________   PROCEDURES  Procedure(s) performed: No  Procedures  Critical Care performed: No ____________________________________________   INITIAL IMPRESSION / ASSESSMENT AND PLAN / ED COURSE  Pertinent labs & imaging results that were available during my care of the patient were reviewed by me and considered in my medical decision making (see chart for details).  52 year old male with history of ESRD on dialysis presents with generalized weakness and fever since approximately 10 AM today and worsening  throughout the day, and associated with vomiting.  On exam, the patient is relatively well-appearing.  He is febrile but other vital signs are normal.  The remainder the exam is as described above.  He has dry mucous membranes.  Abdomen is soft and nontender.  Overall presentation is most consistent with gastroenteritis, versus viral syndrome.  Lower suspicion for pneumonia or other source of infection.  Also consider some element of hypovolemia either from vomiting or possibly related to dialysis.  Plan: Small IV fluid bolus, labs, chest x-ray, influenza swab, and reassess.  If patient's symptoms improve and his workup is negative, anticipate possible discharge home.    ----------------------------------------- 10:47 PM on 01/28/2018 -----------------------------------------  Patient's lab workup is within normal limits for him.  Chest x-ray shows no infiltrate, and influenza is negative.  Patient states he is feeling much better after fluids and Tylenol.  Vital signs are stable.  He feels well to go home.  Overall I suspect most likely viral syndrome.  Return precautions given, the patient expresses understanding.  ____________________________________________   FINAL CLINICAL IMPRESSION(S) / ED DIAGNOSES  Final diagnoses:  Viral illness      NEW MEDICATIONS STARTED DURING THIS VISIT:  New Prescriptions   No medications on file     Note:  This document was prepared using Dragon voice recognition software and may include unintentional dictation errors.    Arta Silence, MD 01/28/18 2248

## 2018-01-30 DIAGNOSIS — D509 Iron deficiency anemia, unspecified: Secondary | ICD-10-CM | POA: Diagnosis not present

## 2018-01-30 DIAGNOSIS — D631 Anemia in chronic kidney disease: Secondary | ICD-10-CM | POA: Diagnosis not present

## 2018-01-30 DIAGNOSIS — N2581 Secondary hyperparathyroidism of renal origin: Secondary | ICD-10-CM | POA: Diagnosis not present

## 2018-01-30 DIAGNOSIS — N186 End stage renal disease: Secondary | ICD-10-CM | POA: Diagnosis not present

## 2018-02-01 DIAGNOSIS — D509 Iron deficiency anemia, unspecified: Secondary | ICD-10-CM | POA: Diagnosis not present

## 2018-02-01 DIAGNOSIS — D631 Anemia in chronic kidney disease: Secondary | ICD-10-CM | POA: Diagnosis not present

## 2018-02-01 DIAGNOSIS — N186 End stage renal disease: Secondary | ICD-10-CM | POA: Diagnosis not present

## 2018-02-01 DIAGNOSIS — N2581 Secondary hyperparathyroidism of renal origin: Secondary | ICD-10-CM | POA: Diagnosis not present

## 2018-02-02 LAB — CULTURE, BLOOD (ROUTINE X 2)
CULTURE: NO GROWTH
CULTURE: NO GROWTH
SPECIAL REQUESTS: ADEQUATE
Special Requests: ADEQUATE

## 2018-02-06 DIAGNOSIS — D509 Iron deficiency anemia, unspecified: Secondary | ICD-10-CM | POA: Diagnosis not present

## 2018-02-06 DIAGNOSIS — D631 Anemia in chronic kidney disease: Secondary | ICD-10-CM | POA: Diagnosis not present

## 2018-02-06 DIAGNOSIS — N2581 Secondary hyperparathyroidism of renal origin: Secondary | ICD-10-CM | POA: Diagnosis not present

## 2018-02-06 DIAGNOSIS — N186 End stage renal disease: Secondary | ICD-10-CM | POA: Diagnosis not present

## 2018-02-08 DIAGNOSIS — D631 Anemia in chronic kidney disease: Secondary | ICD-10-CM | POA: Diagnosis not present

## 2018-02-08 DIAGNOSIS — D509 Iron deficiency anemia, unspecified: Secondary | ICD-10-CM | POA: Diagnosis not present

## 2018-02-08 DIAGNOSIS — N186 End stage renal disease: Secondary | ICD-10-CM | POA: Diagnosis not present

## 2018-02-08 DIAGNOSIS — N2581 Secondary hyperparathyroidism of renal origin: Secondary | ICD-10-CM | POA: Diagnosis not present

## 2018-02-11 DIAGNOSIS — N186 End stage renal disease: Secondary | ICD-10-CM | POA: Diagnosis not present

## 2018-02-11 DIAGNOSIS — N2581 Secondary hyperparathyroidism of renal origin: Secondary | ICD-10-CM | POA: Diagnosis not present

## 2018-02-11 DIAGNOSIS — D509 Iron deficiency anemia, unspecified: Secondary | ICD-10-CM | POA: Diagnosis not present

## 2018-02-11 DIAGNOSIS — D631 Anemia in chronic kidney disease: Secondary | ICD-10-CM | POA: Diagnosis not present

## 2018-02-12 DIAGNOSIS — Z992 Dependence on renal dialysis: Secondary | ICD-10-CM | POA: Diagnosis not present

## 2018-02-12 DIAGNOSIS — T8610 Unspecified complication of kidney transplant: Secondary | ICD-10-CM | POA: Diagnosis not present

## 2018-02-12 DIAGNOSIS — N186 End stage renal disease: Secondary | ICD-10-CM | POA: Diagnosis not present

## 2018-02-13 DIAGNOSIS — Z992 Dependence on renal dialysis: Secondary | ICD-10-CM | POA: Diagnosis not present

## 2018-02-13 DIAGNOSIS — T8610 Unspecified complication of kidney transplant: Secondary | ICD-10-CM | POA: Diagnosis not present

## 2018-02-13 DIAGNOSIS — N186 End stage renal disease: Secondary | ICD-10-CM | POA: Diagnosis not present

## 2018-02-14 DIAGNOSIS — N186 End stage renal disease: Secondary | ICD-10-CM | POA: Diagnosis not present

## 2018-02-14 DIAGNOSIS — Z992 Dependence on renal dialysis: Secondary | ICD-10-CM | POA: Diagnosis not present

## 2018-02-14 DIAGNOSIS — T8610 Unspecified complication of kidney transplant: Secondary | ICD-10-CM | POA: Diagnosis not present

## 2018-02-15 DIAGNOSIS — N2581 Secondary hyperparathyroidism of renal origin: Secondary | ICD-10-CM | POA: Diagnosis not present

## 2018-02-15 DIAGNOSIS — Z992 Dependence on renal dialysis: Secondary | ICD-10-CM | POA: Diagnosis not present

## 2018-02-15 DIAGNOSIS — T8610 Unspecified complication of kidney transplant: Secondary | ICD-10-CM | POA: Diagnosis not present

## 2018-02-15 DIAGNOSIS — N186 End stage renal disease: Secondary | ICD-10-CM | POA: Diagnosis not present

## 2018-02-15 DIAGNOSIS — D631 Anemia in chronic kidney disease: Secondary | ICD-10-CM | POA: Diagnosis not present

## 2018-02-16 DIAGNOSIS — Z992 Dependence on renal dialysis: Secondary | ICD-10-CM | POA: Diagnosis not present

## 2018-02-16 DIAGNOSIS — T8610 Unspecified complication of kidney transplant: Secondary | ICD-10-CM | POA: Diagnosis not present

## 2018-02-16 DIAGNOSIS — N186 End stage renal disease: Secondary | ICD-10-CM | POA: Diagnosis not present

## 2018-02-17 DIAGNOSIS — N186 End stage renal disease: Secondary | ICD-10-CM | POA: Diagnosis not present

## 2018-02-17 DIAGNOSIS — T8610 Unspecified complication of kidney transplant: Secondary | ICD-10-CM | POA: Diagnosis not present

## 2018-02-17 DIAGNOSIS — Z992 Dependence on renal dialysis: Secondary | ICD-10-CM | POA: Diagnosis not present

## 2018-02-18 DIAGNOSIS — Z992 Dependence on renal dialysis: Secondary | ICD-10-CM | POA: Diagnosis not present

## 2018-02-18 DIAGNOSIS — T8610 Unspecified complication of kidney transplant: Secondary | ICD-10-CM | POA: Diagnosis not present

## 2018-02-18 DIAGNOSIS — D631 Anemia in chronic kidney disease: Secondary | ICD-10-CM | POA: Diagnosis not present

## 2018-02-18 DIAGNOSIS — N186 End stage renal disease: Secondary | ICD-10-CM | POA: Diagnosis not present

## 2018-02-18 DIAGNOSIS — N2581 Secondary hyperparathyroidism of renal origin: Secondary | ICD-10-CM | POA: Diagnosis not present

## 2018-02-19 DIAGNOSIS — T8610 Unspecified complication of kidney transplant: Secondary | ICD-10-CM | POA: Diagnosis not present

## 2018-02-19 DIAGNOSIS — Z992 Dependence on renal dialysis: Secondary | ICD-10-CM | POA: Diagnosis not present

## 2018-02-19 DIAGNOSIS — N186 End stage renal disease: Secondary | ICD-10-CM | POA: Diagnosis not present

## 2018-02-20 DIAGNOSIS — D631 Anemia in chronic kidney disease: Secondary | ICD-10-CM | POA: Diagnosis not present

## 2018-02-20 DIAGNOSIS — N2581 Secondary hyperparathyroidism of renal origin: Secondary | ICD-10-CM | POA: Diagnosis not present

## 2018-02-20 DIAGNOSIS — Z992 Dependence on renal dialysis: Secondary | ICD-10-CM | POA: Diagnosis not present

## 2018-02-20 DIAGNOSIS — T8610 Unspecified complication of kidney transplant: Secondary | ICD-10-CM | POA: Diagnosis not present

## 2018-02-20 DIAGNOSIS — N186 End stage renal disease: Secondary | ICD-10-CM | POA: Diagnosis not present

## 2018-02-21 DIAGNOSIS — Z992 Dependence on renal dialysis: Secondary | ICD-10-CM | POA: Diagnosis not present

## 2018-02-21 DIAGNOSIS — N186 End stage renal disease: Secondary | ICD-10-CM | POA: Diagnosis not present

## 2018-02-21 DIAGNOSIS — T8610 Unspecified complication of kidney transplant: Secondary | ICD-10-CM | POA: Diagnosis not present

## 2018-02-22 DIAGNOSIS — D631 Anemia in chronic kidney disease: Secondary | ICD-10-CM | POA: Diagnosis not present

## 2018-02-22 DIAGNOSIS — Z992 Dependence on renal dialysis: Secondary | ICD-10-CM | POA: Diagnosis not present

## 2018-02-22 DIAGNOSIS — N2581 Secondary hyperparathyroidism of renal origin: Secondary | ICD-10-CM | POA: Diagnosis not present

## 2018-02-22 DIAGNOSIS — N186 End stage renal disease: Secondary | ICD-10-CM | POA: Diagnosis not present

## 2018-02-22 DIAGNOSIS — T8610 Unspecified complication of kidney transplant: Secondary | ICD-10-CM | POA: Diagnosis not present

## 2018-02-23 DIAGNOSIS — T8610 Unspecified complication of kidney transplant: Secondary | ICD-10-CM | POA: Diagnosis not present

## 2018-02-23 DIAGNOSIS — Z992 Dependence on renal dialysis: Secondary | ICD-10-CM | POA: Diagnosis not present

## 2018-02-23 DIAGNOSIS — N186 End stage renal disease: Secondary | ICD-10-CM | POA: Diagnosis not present

## 2018-02-24 DIAGNOSIS — Z992 Dependence on renal dialysis: Secondary | ICD-10-CM | POA: Diagnosis not present

## 2018-02-24 DIAGNOSIS — N186 End stage renal disease: Secondary | ICD-10-CM | POA: Diagnosis not present

## 2018-02-24 DIAGNOSIS — T8610 Unspecified complication of kidney transplant: Secondary | ICD-10-CM | POA: Diagnosis not present

## 2018-02-25 DIAGNOSIS — N2581 Secondary hyperparathyroidism of renal origin: Secondary | ICD-10-CM | POA: Diagnosis not present

## 2018-02-25 DIAGNOSIS — T8610 Unspecified complication of kidney transplant: Secondary | ICD-10-CM | POA: Diagnosis not present

## 2018-02-25 DIAGNOSIS — N186 End stage renal disease: Secondary | ICD-10-CM | POA: Diagnosis not present

## 2018-02-25 DIAGNOSIS — Z992 Dependence on renal dialysis: Secondary | ICD-10-CM | POA: Diagnosis not present

## 2018-02-25 DIAGNOSIS — D631 Anemia in chronic kidney disease: Secondary | ICD-10-CM | POA: Diagnosis not present

## 2018-02-26 DIAGNOSIS — N186 End stage renal disease: Secondary | ICD-10-CM | POA: Diagnosis not present

## 2018-02-26 DIAGNOSIS — T8610 Unspecified complication of kidney transplant: Secondary | ICD-10-CM | POA: Diagnosis not present

## 2018-02-26 DIAGNOSIS — Z992 Dependence on renal dialysis: Secondary | ICD-10-CM | POA: Diagnosis not present

## 2018-02-27 DIAGNOSIS — D631 Anemia in chronic kidney disease: Secondary | ICD-10-CM | POA: Diagnosis not present

## 2018-02-27 DIAGNOSIS — Z992 Dependence on renal dialysis: Secondary | ICD-10-CM | POA: Diagnosis not present

## 2018-02-27 DIAGNOSIS — N186 End stage renal disease: Secondary | ICD-10-CM | POA: Diagnosis not present

## 2018-02-27 DIAGNOSIS — N2581 Secondary hyperparathyroidism of renal origin: Secondary | ICD-10-CM | POA: Diagnosis not present

## 2018-02-27 DIAGNOSIS — T8610 Unspecified complication of kidney transplant: Secondary | ICD-10-CM | POA: Diagnosis not present

## 2018-02-28 DIAGNOSIS — T8610 Unspecified complication of kidney transplant: Secondary | ICD-10-CM | POA: Diagnosis not present

## 2018-02-28 DIAGNOSIS — N186 End stage renal disease: Secondary | ICD-10-CM | POA: Diagnosis not present

## 2018-02-28 DIAGNOSIS — Z992 Dependence on renal dialysis: Secondary | ICD-10-CM | POA: Diagnosis not present

## 2018-03-01 ENCOUNTER — Encounter: Payer: Self-pay | Admitting: Family Medicine

## 2018-03-01 ENCOUNTER — Ambulatory Visit (INDEPENDENT_AMBULATORY_CARE_PROVIDER_SITE_OTHER): Payer: Medicare Other | Admitting: Family Medicine

## 2018-03-01 ENCOUNTER — Other Ambulatory Visit: Payer: Self-pay

## 2018-03-01 ENCOUNTER — Ambulatory Visit (INDEPENDENT_AMBULATORY_CARE_PROVIDER_SITE_OTHER): Payer: Medicare Other

## 2018-03-01 VITALS — Temp 98.3°F | Ht 70.47 in | Wt 180.8 lb

## 2018-03-01 DIAGNOSIS — R6883 Chills (without fever): Secondary | ICD-10-CM | POA: Diagnosis not present

## 2018-03-01 DIAGNOSIS — R059 Cough, unspecified: Secondary | ICD-10-CM

## 2018-03-01 DIAGNOSIS — R5381 Other malaise: Secondary | ICD-10-CM

## 2018-03-01 DIAGNOSIS — R05 Cough: Secondary | ICD-10-CM | POA: Diagnosis not present

## 2018-03-01 DIAGNOSIS — N186 End stage renal disease: Secondary | ICD-10-CM | POA: Diagnosis not present

## 2018-03-01 DIAGNOSIS — Z992 Dependence on renal dialysis: Secondary | ICD-10-CM

## 2018-03-01 DIAGNOSIS — T8610 Unspecified complication of kidney transplant: Secondary | ICD-10-CM | POA: Diagnosis not present

## 2018-03-01 DIAGNOSIS — D631 Anemia in chronic kidney disease: Secondary | ICD-10-CM | POA: Diagnosis not present

## 2018-03-01 DIAGNOSIS — N2581 Secondary hyperparathyroidism of renal origin: Secondary | ICD-10-CM | POA: Diagnosis not present

## 2018-03-01 NOTE — Patient Instructions (Addendum)
I clinically do not have any evidence of pneumonia as source of your cough. In clinic you are dry and below your dry weight. I think with the vomiting you have intermittently been doing for past several weeks you have become dehydrated and that is contributing to your sense of malaise.   I encourage you to increase your fluids by 8 ounces a day. I encouraged daily weights. You should discuss this with your dialysis team.  Please be mindful of fluid overload and other symptoms as discussed that would warrant ER visit.   Please follow up with your PCP next week  IF you received an x-ray today, you will receive an invoice from [facility]. Please contact [facility] at [phone] with questions or concerns regarding your invoice.   IF you received labwork today, you will receive an invoice from [facility]. Please contact [facility] at [phone] with questions or concerns regarding your invoice.   Our billing staff will not be able to assist you with questions regarding bills from these companies.  You will be contacted with the lab results as soon as they are available. The fastest way to get your results is to activate your My Chart account. Instructions are located on the last page of this paperwork. If you have not heard from [place] regarding the results in 2 weeks, please contact this office.

## 2018-03-01 NOTE — Progress Notes (Signed)
5/17/201910:29 AM  Jonathon Conley Mar 12, 1966, 52 y.o. male [id number]  Chief Complaint  Patient presents with  . Fatigue     flu like symptoms for 1 week. vomiting with blood. Vomiting usually happens after dialysis. Gets dialysis MWF of each wk    HPI:   Patient is a 52 y.o. male with past medical history significant for ESRD on HD on M/W/F who presents today for several weeks of not feeling well  He was seen in ER on 01/28/18, for very similar presentation, workup neg  He reports that for the past 3 weeks he has not been feeling well after dialysis session He reports chills, nausea, vomiting after each dialysis session, they have been withholding certain meds, thinking sensipar might be cause of vomiting. He denies any symptoms on non-dialysis days He went this morning, states was dialyzed less than a kg He however has been having a cough of lately, clear occ phlegm, last night felt as he was coming down with the flu He denies any ear/throat/sinus pain. No SOB. No allergy sx.  He denies any diarrhea. He produces very little urine, denies any dysuria/hematuria.urgency/frequency  He has been on HD since Oct 2016 after failed renal transplant, ESRD 2/2 glomerulonephritis (does not know type) Does dialysis at [facility] in [place] on M/W/F Reports dry weight 83 kg Reports fluid restriction of 8 ounces He has not taken BP meds yet, normally takes after HD  WBC 2.77 done at dialysis 2 days ago  Fall Risk  03/01/2018 11/14/2017 02/01/2016 01/04/2016 12/06/2015  Falls in the past year? No No No No No     Depression screen [facility] 2/9 03/01/2018 11/14/2017 02/01/2016  Decreased Interest 0 0 0  Down, Depressed, Hopeless 0 0 0  PHQ - 2 Score 0 0 0    Allergies  Allergen Reactions  . Lisinopril Swelling    Facial swelling    Prior to Admission medications   Medication Sig Start Date End Date Taking? Authorizing Provider  amLODipine (NORVASC) 10 MG tablet Take 10 mg by  mouth daily.   Yes [provider]  aspirin EC 81 MG tablet Take 81 mg by mouth daily.    Yes [provider]  calcitRIOL (ROCALTROL) 0.5 MCG capsule Take 1 capsule (0.5 mcg total) by mouth Every Tuesday,Thursday,and Saturday with dialysis. 09/24/15  Yes TatShanon Brow, MD  calcium acetate (PHOSLO) 667 MG capsule Take 667 mg by mouth 3 (three) times daily with meals.   Yes [provider]  carvedilol (COREG) 25 MG tablet Take 25 mg by mouth 2 (two) times daily.    Yes [provider]  cinacalcet (SENSIPAR) 30 MG tablet Take 30 mg by mouth 3 (three) times a week. Wyvonnia Lora, Sat after dialysis   Yes [provider]  ferrous sulfate 325 (65 FE) MG tablet Take 325 mg by mouth 3 (three) times daily with meals. Reported on 04/09/2016   Yes [provider]  Mesalamine 800 MG TBEC Take 1 tablet (800 mg total) by mouth 3 (three) times daily. 05/03/16  Yes Esterwood, Amy S, PA-C  oxyCODONE-acetaminophen (PERCOCET) 7.5-325 MG tablet Take 1 tablet by mouth every 4 (four) hours as needed for severe pain. 12/13/16  Yes Algernon Huxley, MD  simvastatin (ZOCOR) 5 MG tablet Take 1 tablet (5 mg total) by mouth at bedtime. 02/17/15  Yes Ghimire, Henreitta Leber, MD  spironolactone (ALDACTONE) 25 MG tablet Take 25 mg by mouth 2 (two) times daily.   Yes  [provider]    Past Medical History:  Diagnosis Date  . Anemia   . Asthma    childhood  . Blood transfusion without reported diagnosis   . Chronic kidney disease    Dialysis Mon, Wed, Fri  . Colitis   . Diverticulosis 04/12/16   also seen: sigmoid and rectal erythema, path:   . Hemorrhoids 03/2016   bleeding at 04/12/16 colonoscopy, treated with  APC laser. cauterization  . Hyperlipidemia   . Hypertension   . OSA (obstructive sleep apnea) 03/27/2013    Past Surgical History:  Procedure Laterality Date  . APPLICATION OF WOUND VAC Right 12/13/2016   Procedure: APPLICATION OF WOUND VAC;  Surgeon: Algernon Huxley, MD;   Location: [facility] ORS;  Service: Vascular;  Laterality: Right;  . APPLICATION OF WOUND VAC Right 12/19/2016   Procedure: APPLICATION OF WOUND VAC;  Surgeon: Algernon Huxley, MD;  Location: [facility] ORS;  Service: Vascular;  Laterality: Right;  . COLONOSCOPY N/A 04/12/2016   Procedure: COLONOSCOPY;  Surgeon: Mauri Pole, MD;  Location: [place] [facility]COPY;  Service: Endoscopy;  Laterality: N/A;  . DIALYSIS/PERMA CATHETER INSERTION N/A 02/26/2017   Procedure: Dialysis/Perma Catheter Insertion;  Surgeon: Algernon Huxley, MD;  Location: [place] CV LAB;  Service: Cardiovascular;  Laterality: N/A;  . EXCHANGE OF A DIALYSIS CATHETER  11/20/2016   Procedure: Exchange Of A Dialysis Catheter;  Surgeon: Algernon Huxley, MD;  Location: [place] CV LAB;  Service: Cardiovascular;;  . fistulagrams Right   . HEMATOMA EVACUATION Right 12/13/2016   Procedure: EVACUATION HEMATOMA;  Surgeon: Algernon Huxley, MD;  Location: [facility] ORS;  Service: Vascular;  Laterality: Right;  . HEMATOMA EVACUATION Right 12/19/2016   Procedure: EVACUATIONof seroma;  Surgeon: Algernon Huxley, MD;  Location: [facility] ORS;  Service: Vascular;  Laterality: Right;  . HOT HEMOSTASIS N/A 04/12/2016   Procedure: HOT HEMOSTASIS (ARGON PLASMA COAGULATION/BICAP);  Surgeon: Mauri Pole, MD;  Location: [facility] ENDOSCOPY;  Service: Endoscopy;  Laterality: N/A;  . KIDNEY TRANSPLANT    . PERIPHERAL VASCULAR CATHETERIZATION Bilateral 10/02/2016   Procedure: Upper Extremity Venography;  Surgeon: Algernon Huxley, MD;  Location: [place] CV LAB;  Service: Cardiovascular;  Laterality: Bilateral;  . PERIPHERAL VASCULAR CATHETERIZATION Right 11/08/2016   Procedure: DIALYSIS/PERMA CATHETER REMOVAL right Jugular;  Surgeon: Algernon Huxley, MD;  Location: [facility] ORS;  Service: Vascular;  Laterality: Right;  . PERIPHERAL VASCULAR CATHETERIZATION Left 11/08/2016   Procedure: DIALYSIS/PERMA CATHETER INSERTION left jugular with u/s guide and flouroscan;  Surgeon: Algernon Huxley, MD;  Location: [facility]  ORS;  Service: Vascular;  Laterality: Left;  . PERIPHERAL VASCULAR CATHETERIZATION N/A 11/08/2016   Procedure: IVC FILTER INSERTION;  Surgeon: Algernon Huxley, MD;  Location: [facility] ORS;  Service: Vascular;  Laterality: N/A;  . perm a cath in Rt upper chest Right   . THORACENTESIS    . VASCULAR ACCESS DEVICE INSERTION Right 11/08/2016   Procedure: INSERTION OF HERO VASCULAR ACCESS DEVICE ( GRAFT );  Surgeon: Algernon Huxley, MD;  Location: [facility] ORS;  Service: Vascular;  Laterality: Right;    Social History   Tobacco Use  . Smoking status: Never Smoker  . Smokeless tobacco: Never Used  Substance Use Topics  . Alcohol use: No    Family History  Problem Relation Age of Onset  . Heart disease Mother   . Cancer Mother        ovarian  . Cancer Maternal Grandmother   . Cancer Maternal Grandfather   .  Asthma Son   . Asthma Son     Review of Systems  Constitutional: Positive for chills, malaise/fatigue and weight loss. Negative for fever.  HENT: Negative for congestion, ear pain, sinus pain and sore throat.   Respiratory: Positive for cough. Negative for shortness of breath and wheezing.   Cardiovascular: Negative for chest pain, palpitations and leg swelling.  Gastrointestinal: Positive for nausea and vomiting. Negative for abdominal pain, constipation, diarrhea and heartburn.  Musculoskeletal: Negative for myalgias.  Neurological: Negative for focal weakness.     OBJECTIVE:  Temperature 98.3 F (36.8 C), temperature source Oral, height 5' 10.47" (1.79 m), weight 180 lb 12.8 oz (82 kg), SpO2 98 %.  Wt Readings from Last 3 Encounters:  03/01/18 180 lb 12.8 oz (82 kg)  01/28/18 182 lb 15.7 oz (83 kg)  11/14/17 185 lb (83.9 kg)    Physical Exam  Constitutional: He is oriented to person, place, and time. He appears well-developed and well-nourished. He does not appear ill.  HENT:  Head: Normocephalic and atraumatic.  Right Ear: Hearing, tympanic membrane, external ear and ear canal  normal.  Left Ear: Hearing, tympanic membrane, external ear and ear canal normal.  Mouth/Throat: Mucous membranes are dry. No oropharyngeal exudate.  Eyes: Pupils are equal, round, and reactive to light. Conjunctivae and EOM are normal.  Neck: Neck supple. No thyromegaly present.  Cardiovascular: Normal rate, regular rhythm, normal heart sounds and intact distal pulses. Exam reveals no gallop and no friction rub.  No murmur heard. Pulmonary/Chest: Effort normal and breath sounds normal. He has no wheezes. He has no rhonchi. He has no rales.  Abdominal: Soft. Bowel sounds are normal. He exhibits no distension and no mass. There is no tenderness.  Musculoskeletal: Normal range of motion. He exhibits no edema.  Lymphadenopathy:    He has no cervical adenopathy.  Neurological: He is alert and oriented to person, place, and time. He has normal strength and normal reflexes. No cranial nerve deficit. Coordination and gait normal.  Skin: Skin is warm and dry. Capillary refill takes more than 3 seconds.  Psychiatric: He has a normal mood and affect.  Nursing note and vitals reviewed.  Dg Chest 2 View  Result Date: 03/01/2018 CLINICAL DATA:  Cough and chills EXAM: CHEST - 2 VIEW COMPARISON:  01/28/2018 FINDINGS: Cardiac shadow is stable. Left dialysis catheter and right hero graft are seen and stable. Chronic blunting of the right costophrenic angle is noted. Stable rounded density is noted in the right base dating back to CT examination from 2017. No new infiltrate or sizable effusion is seen. No acute bony abnormality is noted. IMPRESSION: Chronic changes in the right lung base. No acute abnormality noted. Electronically Signed   By: Inez Catalina M.D.   On: 03/01/2018 11:32     ASSESSMENT and PLAN  1. Cough Lungs clear, No WOB, normal o2, CXR stable. Discussed most likely viral in nature. Supportive measures discussed - POCT CBC - unable to draw blood 2/2 dehydration - Comprehensive metabolic  panel - DG Chest 2 View; Future  2. Chills - POCT CBC - Comprehensive metabolic panel - DG Chest 2 View; Future  3. Malaise - POCT CBC - Comprehensive metabolic panel - DG Chest 2 View; Future  4. ESRD on dialysis [facility]) Patient clinically dry, under his dry weight, tolerating PO fluids in clinic. Discussed increasing fluids by 8 ounces/daily. Discussed daily weight and RTC precautions.   Discussed case with his nephrologist after patient had left. She is ok  with 32 ounce fluid a day. She will followup with him at dialysis next week. I left VM for patient to call back. - POCT CBC - Comprehensive metabolic panel  Return in about 1 week (around 03/08/2018) for PCP and nephrology.    Rutherford Guys, MD Primary Care at [facility] [place], [place] [address] Ph.  [phone] Fax [phone]

## 2018-03-02 DIAGNOSIS — T8610 Unspecified complication of kidney transplant: Secondary | ICD-10-CM | POA: Diagnosis not present

## 2018-03-02 DIAGNOSIS — N186 End stage renal disease: Secondary | ICD-10-CM | POA: Diagnosis not present

## 2018-03-02 DIAGNOSIS — Z992 Dependence on renal dialysis: Secondary | ICD-10-CM | POA: Diagnosis not present

## 2018-03-02 LAB — COMPREHENSIVE METABOLIC PANEL
ALT: 11 IU/L (ref 0–44)
AST: 20 IU/L (ref 0–40)
Albumin/Globulin Ratio: 1.1 — ABNORMAL LOW (ref 1.2–2.2)
Albumin: 4.8 g/dL (ref 3.5–5.5)
Alkaline Phosphatase: 67 IU/L (ref 39–117)
BUN/Creatinine Ratio: 1 — ABNORMAL LOW (ref 9–20)
BUN: 9 mg/dL (ref 6–24)
Bilirubin Total: 0.7 mg/dL (ref 0.0–1.2)
CO2: 29 mmol/L (ref 20–29)
Calcium: 9.7 mg/dL (ref 8.7–10.2)
Chloride: 92 mmol/L — ABNORMAL LOW (ref 96–106)
Creatinine, Ser: 6.46 mg/dL (ref 0.76–1.27)
GFR calc Af Amer: 11 mL/min/{1.73_m2} — ABNORMAL LOW (ref >59)
GFR calc non Af Amer: 9 mL/min/{1.73_m2} — ABNORMAL LOW (ref >59)
Globulin, Total: 4.3 g/dL (ref 1.5–4.5)
Glucose: 92 mg/dL (ref 65–99)
Potassium: 3.8 mmol/L (ref 3.5–5.2)
Sodium: 139 mmol/L (ref 134–144)
Total Protein: 9.1 g/dL — ABNORMAL HIGH (ref 6.0–8.5)

## 2018-03-03 DIAGNOSIS — N186 End stage renal disease: Secondary | ICD-10-CM | POA: Diagnosis not present

## 2018-03-03 DIAGNOSIS — Z992 Dependence on renal dialysis: Secondary | ICD-10-CM | POA: Diagnosis not present

## 2018-03-03 DIAGNOSIS — T8610 Unspecified complication of kidney transplant: Secondary | ICD-10-CM | POA: Diagnosis not present

## 2018-03-04 DIAGNOSIS — T8610 Unspecified complication of kidney transplant: Secondary | ICD-10-CM | POA: Diagnosis not present

## 2018-03-04 DIAGNOSIS — N186 End stage renal disease: Secondary | ICD-10-CM | POA: Diagnosis not present

## 2018-03-04 DIAGNOSIS — Z992 Dependence on renal dialysis: Secondary | ICD-10-CM | POA: Diagnosis not present

## 2018-03-05 ENCOUNTER — Encounter: Payer: Self-pay | Admitting: Emergency Medicine

## 2018-03-05 ENCOUNTER — Other Ambulatory Visit: Payer: Self-pay

## 2018-03-05 ENCOUNTER — Ambulatory Visit (INDEPENDENT_AMBULATORY_CARE_PROVIDER_SITE_OTHER): Payer: Medicare Other | Admitting: Emergency Medicine

## 2018-03-05 VITALS — BP 130/88 | HR 67 | Temp 98.2°F | Resp 16 | Ht 69.75 in | Wt 184.2 lb

## 2018-03-05 DIAGNOSIS — R05 Cough: Secondary | ICD-10-CM | POA: Diagnosis not present

## 2018-03-05 DIAGNOSIS — J988 Other specified respiratory disorders: Secondary | ICD-10-CM

## 2018-03-05 DIAGNOSIS — N186 End stage renal disease: Secondary | ICD-10-CM

## 2018-03-05 DIAGNOSIS — Z992 Dependence on renal dialysis: Secondary | ICD-10-CM | POA: Diagnosis not present

## 2018-03-05 DIAGNOSIS — R059 Cough, unspecified: Secondary | ICD-10-CM

## 2018-03-05 DIAGNOSIS — T8610 Unspecified complication of kidney transplant: Secondary | ICD-10-CM | POA: Diagnosis not present

## 2018-03-05 DIAGNOSIS — B9789 Other viral agents as the cause of diseases classified elsewhere: Secondary | ICD-10-CM | POA: Diagnosis not present

## 2018-03-05 NOTE — Progress Notes (Signed)
Jonathon Conley 52 y.o.   Chief Complaint  Patient presents with  . Cough    nonproductive x 2 weeks  . Fatigue    x 2 weeks    HISTORY OF PRESENT ILLNESS: This is a 52 y.o. male dialysis patient complaining of 2-week history of nonproductive cough with occasional fatigue.  No other significant symptoms.  Denies fever or chills.  Was seen here on 03/01/2018 for the same and had a chest x-ray that was read as unremarkable.  Treated conservatively.  No new symptoms since.  HPI   Prior to Admission medications   Medication Sig Start Date End Date Taking? Authorizing Provider  amLODipine (NORVASC) 10 MG tablet Take 10 mg by mouth daily.   Yes [provider]  aspirin EC 81 MG tablet Take 81 mg by mouth daily.    Yes [provider]  calcitRIOL (ROCALTROL) 0.5 MCG capsule Take 1 capsule (0.5 mcg total) by mouth Every Tuesday,Thursday,and Saturday with dialysis. 09/24/15  Yes TatShanon Brow, MD  calcium acetate (PHOSLO) 667 MG capsule Take 667 mg by mouth 3 (three) times daily with meals.   Yes [provider]  carvedilol (COREG) 25 MG tablet Take 25 mg by mouth 2 (two) times daily.    Yes [provider]  ferrous sulfate 325 (65 FE) MG tablet Take 325 mg by mouth 3 (three) times daily with meals. Reported on 04/09/2016   Yes [provider]  Mesalamine 800 MG TBEC Take 1 tablet (800 mg total) by mouth 3 (three) times daily. 05/03/16  Yes Esterwood, Amy S, PA-C  oxyCODONE-acetaminophen (PERCOCET) 7.5-325 MG tablet Take 1 tablet by mouth every 4 (four) hours as needed for severe pain. 12/13/16  Yes Algernon Huxley, MD  simvastatin (ZOCOR) 5 MG tablet Take 1 tablet (5 mg total) by mouth at bedtime. 02/17/15  Yes Ghimire, Henreitta Leber, MD  spironolactone (ALDACTONE) 25 MG tablet Take 25 mg by mouth 2 (two) times daily.   Yes [provider]  cinacalcet (SENSIPAR) 30 MG tablet Take 30 mg by mouth 3 (three) times a week. Wyvonnia Lora, Sat after dialysis     [provider]    Allergies  Allergen Reactions  . Lisinopril Swelling    Facial swelling    Patient Active Problem List   Diagnosis Date Noted  . Cough 11/14/2017  . Lower respiratory infection 11/14/2017  . History of fever 11/14/2017  . SIRS (systemic inflammatory response syndrome) ([place]) 12/19/2016  . ESRD (end stage renal disease) ([place]) 12/19/2016  . Hyponatremia 12/19/2016  . Anemia of chronic disease 12/19/2016  . Postprocedural seroma of a musculoskeletal structure following other procedure 12/17/2016  . Hematochezia 04/08/2016  . HTN (hypertension) 04/08/2016  . Intravenous line infection ([place]) 04/08/2016  . Insomnia 01/04/2016  . Malignant hypertension 12/14/2015  . Symptomatic anemia 11/03/2015  . Chronic anemia 11/03/2015  . Thrombocytopenia ([place]) 11/03/2015  . Pleural effusion on right 11/03/2015  . Shortness of breath 11/02/2015  . Sepsis ([place]) 09/23/2015  . ESRD on dialysis ([place]) 09/22/2015  . Accelerated hypertension 09/22/2015  . Leg edema, left   . HCAP (healthcare-associated pneumonia) 09/21/2015  . Bacteremia due to Gram-positive bacteria 08/20/2015  . Acute blood loss anemia 08/12/2015  . C. difficile colitis 08/12/2015  . Bleeding gastrointestinal 08/12/2015  . Acute-on-chronic renal failure ([place]) 08/10/2015  . H/O kidney transplant 08/08/2015  . Non compliance with medical treatment 08/08/2015  . History of anemia 08/08/2015  . AKI (acute kidney injury) ([place]) 02/14/2015  .  Renal transplant recipient 02/14/2015  . Acute on chronic diastolic congestive heart failure ([place]) 02/14/2015  . Anemia 02/14/2015  . Hyperlipidemia   . OSA (obstructive sleep apnea) 03/27/2013    Past Medical History:  Diagnosis Date  . Anemia   . Asthma    childhood  . Blood transfusion without reported diagnosis   . Chronic kidney disease    Dialysis Mon, Wed, Fri  . Colitis   . Diverticulosis 04/12/16   also seen: sigmoid and rectal erythema, path:   .  Hemorrhoids 03/2016   bleeding at 04/12/16 colonoscopy, treated with  APC laser. cauterization  . Hyperlipidemia   . Hypertension   . OSA (obstructive sleep apnea) 03/27/2013    Past Surgical History:  Procedure Laterality Date  . APPLICATION OF WOUND VAC Right 12/13/2016   Procedure: APPLICATION OF WOUND VAC;  Surgeon: Algernon Huxley, MD;  Location: [facility] ORS;  Service: Vascular;  Laterality: Right;  . APPLICATION OF WOUND VAC Right 12/19/2016   Procedure: APPLICATION OF WOUND VAC;  Surgeon: Algernon Huxley, MD;  Location: [facility] ORS;  Service: Vascular;  Laterality: Right;  . COLONOSCOPY N/A 04/12/2016   Procedure: COLONOSCOPY;  Surgeon: Mauri Pole, MD;  Location: [place] [facility]OSCOPY;  Service: Endoscopy;  Laterality: N/A;  . DIALYSIS/PERMA CATHETER INSERTION N/A 02/26/2017   Procedure: Dialysis/Perma Catheter Insertion;  Surgeon: Algernon Huxley, MD;  Location: [place] CV LAB;  Service: Cardiovascular;  Laterality: N/A;  . EXCHANGE OF A DIALYSIS CATHETER  11/20/2016   Procedure: Exchange Of A Dialysis Catheter;  Surgeon: Algernon Huxley, MD;  Location: [place] CV LAB;  Service: Cardiovascular;;  . fistulagrams Right   . HEMATOMA EVACUATION Right 12/13/2016   Procedure: EVACUATION HEMATOMA;  Surgeon: Algernon Huxley, MD;  Location: [facility] ORS;  Service: Vascular;  Laterality: Right;  . HEMATOMA EVACUATION Right 12/19/2016   Procedure: EVACUATIONof seroma;  Surgeon: Algernon Huxley, MD;  Location: [facility] OR[facility];  Service: Vascular;  Laterality: Right;  . HOT HEMOSTASIS N/A 04/12/2016   Procedure: HOT HEMOSTASIS (ARGON PLASMA COAGULATION/BICAP);  Surgeon: Mauri Pole, MD;  Location: [facility] ENDOSCOPY;  Service: Endoscopy;  Laterality: N/A;  . KIDNEY TRANSPLANT    . PERIPHERAL VASCULAR CATHETERIZATION Bilateral 10/02/2016   Procedure: Upper Extremity Venography;  Surgeon: Algernon Huxley, MD;  Location: [place] CV LAB;  Service: Cardiovascular;  Laterality: Bilateral;  . PERIPHERAL VASCULAR CATHETERIZATION Right  11/08/2016   Procedure: DIALYSIS/PERMA CATHETER REMOVAL right Jugular;  Surgeon: Algernon Huxley, MD;  Location: [facility] ORS;  Service: Vascular;  Laterality: Right;  . PERIPHERAL VASCULAR CATHETERIZATION Left 11/08/2016   Procedure: DIALYSIS/PERMA CATHETER INSERTION left jugular with u/s guide and flouroscan;  Surgeon: Algernon Huxley, MD;  Location: [facility] ORS;  Service: Vascular;  Laterality: Left;  . PERIPHERAL VASCULAR CATHETERIZATION N/A 11/08/2016   Procedure: IVC FILTER INSERTION;  Surgeon: Algernon Huxley, MD;  Location: [facility] ORS;  Service: Vascular;  Laterality: N/A;  . perm a cath in Rt upper chest Right   . THORACENTESIS    . VASCULAR ACCESS DEVICE INSERTION Right 11/08/2016   Procedure: INSERTION OF HERO VASCULAR ACCESS DEVICE ( GRAFT );  Surgeon: Algernon Huxley, MD;  Location: [facility] ORS;  Service: Vascular;  Laterality: Right;    Social History   Socioeconomic History  . Marital status: Married    Spouse name: Not on file  . Number of children: Not on file  . Years of education: Not on file  . Highest education level: Not on  file  Occupational History  . Not on file  Social Needs  . Financial resource strain: Not on file  . Food insecurity:    Worry: Not on file    Inability: Not on file  . Transportation needs:    Medical: Not on file    Non-medical: Not on file  Tobacco Use  . Smoking status: Never Smoker  . Smokeless tobacco: Never Used  Substance and Sexual Activity  . Alcohol use: No  . Drug use: No  . Sexual activity: Not on file  Lifestyle  . Physical activity:    Days per week: Not on file    Minutes per session: Not on file  . Stress: Not on file  Relationships  . Social connections:    Talks on phone: Not on file    Gets together: Not on file    Attends religious service: Not on file    Active member of club or organization: Not on file    Attends meetings of clubs or organizations: Not on file    Relationship status: Not on file  . Intimate partner violence:    Fear  of current or ex partner: Not on file    Emotionally abused: Not on file    Physically abused: Not on file    Forced sexual activity: Not on file  Other Topics Concern  . Not on file  Social History Narrative  . Not on file    Family History  Problem Relation Age of Onset  . Heart disease Mother   . Cancer Mother        ovarian  . Cancer Maternal Grandmother   . Cancer Maternal Grandfather   . Asthma Son   . Asthma Son      Review of Systems  Constitutional: Positive for malaise/fatigue. Negative for chills and fever.  HENT: Negative.  Negative for congestion, nosebleeds and sore throat.   Eyes: Negative.  Negative for blurred vision and double vision.  Respiratory: Positive for cough.   Cardiovascular: Negative.  Negative for chest pain and palpitations.  Gastrointestinal: Negative.  Negative for abdominal pain, nausea and vomiting.  Genitourinary: Negative for flank pain.  Musculoskeletal: Negative for back pain, myalgias and neck pain.  Skin: Negative.  Negative for rash.  Neurological: Negative.  Negative for dizziness, sensory change, speech change, focal weakness and headaches.  Endo/Heme/Allergies: Negative.   All other systems reviewed and are negative.   Vitals:   03/05/18 1151  BP: 130/88  Pulse: 67  Resp: 16  Temp: 98.2 F (36.8 C)  SpO2: 99%    Physical Exam  Constitutional: He is oriented to person, place, and time. He appears well-developed and well-nourished.  HENT:  Head: Normocephalic and atraumatic.  Mouth/Throat: Oropharynx is clear and moist.  Eyes: Pupils are equal, round, and reactive to light. EOM are normal.  Neck: Normal range of motion. Neck supple.  Cardiovascular: Normal rate, regular rhythm and normal heart sounds.  Pulmonary/Chest: Effort normal and breath sounds normal.  Abdominal: Soft. There is no tenderness.  Musculoskeletal: Normal range of motion.  Lymphadenopathy:    He has no cervical adenopathy.  Neurological: He is  alert and oriented to person, place, and time.  Skin: Skin is warm and dry. Capillary refill takes less than 2 seconds.  Psychiatric: He has a normal mood and affect. His behavior is normal.  Vitals reviewed.    ASSESSMENT & PLAN: Kaelen was seen today for cough and fatigue.  Diagnoses and all orders for  this visit:  Cough  Viral respiratory infection  End stage renal disease [facility])     Patient Instructions       IF you received an x-ray today, you will receive an invoice from [facility]. Please contact [facility] Radiology at [phone] with questions or concerns regarding your invoice.   IF you received labwork today, you will receive an invoice from [facility]. Please contact [facility] at [phone] with questions or concerns regarding your invoice.   Our billing staff will not be able to assist you with questions regarding bills from these companies.  You will be contacted with the lab results as soon as they are available. The fastest way to get your results is to activate your My Chart account. Instructions are located on the last page of this paperwork. If you have not heard from [place] regarding the results in 2 weeks, please contact this office.     Cough, Adult A cough helps to clear your throat and lungs. A cough may last only 2-3 weeks (acute), or it may last longer than 8 weeks (chronic). Many different things can cause a cough. A cough may be a sign of an illness or another medical condition. Follow these instructions at home:  Pay attention to any changes in your cough.  Take medicines only as told by your doctor. ? If you were prescribed an antibiotic medicine, take it as told by your doctor. Do not stop taking it even if you start to feel better. ? Talk with your doctor before you try using a cough medicine.  Drink enough fluid to keep your pee (urine) clear or pale yellow.  If the air is dry, use a cold steam vaporizer or humidifier in your  home.  Stay away from things that make you cough at work or at home.  If your cough is worse at night, try using extra pillows to raise your head up higher while you sleep.  Do not smoke, and try not to be around smoke. If you need help quitting, ask your doctor.  Do not have caffeine.  Do not drink alcohol.  Rest as needed. Contact a doctor if:  You have new problems (symptoms).  You cough up yellow fluid (pus).  Your cough does not get better after 2-3 weeks, or your cough gets worse.  Medicine does not help your cough and you are not sleeping well.  You have pain that gets worse or pain that is not helped with medicine.  You have a fever.  You are losing weight and you do not know why.  You have night sweats. Get help right away if:  You cough up blood.  You have trouble breathing.  Your heartbeat is very fast. This information is not intended to replace advice given to you by your health care provider. Make sure you discuss any questions you have with your health care provider. Document Released: 06/15/2011 Document Revised: 03/09/2016 Document Reviewed: 12/09/2014 Elsevier Interactive Patient Education  2018 [employer]      Agustina Caroli, MD [facility]

## 2018-03-05 NOTE — Patient Instructions (Addendum)
     IF you received an x-ray today, you will receive an invoice from [facility]. Please contact [facility] at [phone] with questions or concerns regarding your invoice.   IF you received labwork today, you will receive an invoice from [facility]. Please contact [facility] at [phone] with questions or concerns regarding your invoice.   Our billing staff will not be able to assist you with questions regarding bills from these companies.  You will be contacted with the lab results as soon as they are available. The fastest way to get your results is to activate your My Chart account. Instructions are located on the last page of this paperwork. If you have not heard from us regarding the results in 2 weeks, please contact this office.     Cough, Adult A cough helps to clear your throat and lungs. A cough may last only 2-3 weeks (acute), or it may last longer than 8 weeks (chronic). Many different things can cause a cough. A cough may be a sign of an illness or another medical condition. Follow these instructions at home:  Pay attention to any changes in your cough.  Take medicines only as told by your doctor. ? If you were prescribed an antibiotic medicine, take it as told by your doctor. Do not stop taking it even if you start to feel better. ? Talk with your doctor before you try using a cough medicine.  Drink enough fluid to keep your pee (urine) clear or pale yellow.  If the air is dry, use a cold steam vaporizer or humidifier in your home.  Stay away from things that make you cough at work or at home.  If your cough is worse at night, try using extra pillows to raise your head up higher while you sleep.  Do not smoke, and try not to be around smoke. If you need help quitting, ask your doctor.  Do not have caffeine.  Do not drink alcohol.  Rest as needed. Contact a doctor if:  You have new problems (symptoms).  You cough up yellow fluid  (pus).  Your cough does not get better after 2-3 weeks, or your cough gets worse.  Medicine does not help your cough and you are not sleeping well.  You have pain that gets worse or pain that is not helped with medicine.  You have a fever.  You are losing weight and you do not know why.  You have night sweats. Get help right away if:  You cough up blood.  You have trouble breathing.  Your heartbeat is very fast. This information is not intended to replace advice given to you by your health care provider. Make sure you discuss any questions you have with your health care provider. Document Released: 06/15/2011 Document Revised: 03/09/2016 Document Reviewed: 12/09/2014 Elsevier Interactive Patient Education  2018 [employer]vier Inc[employer]  

## 2018-03-06 DIAGNOSIS — Z992 Dependence on renal dialysis: Secondary | ICD-10-CM | POA: Diagnosis not present

## 2018-03-06 DIAGNOSIS — N186 End stage renal disease: Secondary | ICD-10-CM | POA: Diagnosis not present

## 2018-03-06 DIAGNOSIS — N2581 Secondary hyperparathyroidism of renal origin: Secondary | ICD-10-CM | POA: Diagnosis not present

## 2018-03-06 DIAGNOSIS — D631 Anemia in chronic kidney disease: Secondary | ICD-10-CM | POA: Diagnosis not present

## 2018-03-06 DIAGNOSIS — T8610 Unspecified complication of kidney transplant: Secondary | ICD-10-CM | POA: Diagnosis not present

## 2018-03-07 DIAGNOSIS — N186 End stage renal disease: Secondary | ICD-10-CM | POA: Diagnosis not present

## 2018-03-07 DIAGNOSIS — T8610 Unspecified complication of kidney transplant: Secondary | ICD-10-CM | POA: Diagnosis not present

## 2018-03-07 DIAGNOSIS — Z992 Dependence on renal dialysis: Secondary | ICD-10-CM | POA: Diagnosis not present

## 2018-03-08 DIAGNOSIS — Z992 Dependence on renal dialysis: Secondary | ICD-10-CM | POA: Diagnosis not present

## 2018-03-08 DIAGNOSIS — D631 Anemia in chronic kidney disease: Secondary | ICD-10-CM | POA: Diagnosis not present

## 2018-03-08 DIAGNOSIS — T8610 Unspecified complication of kidney transplant: Secondary | ICD-10-CM | POA: Diagnosis not present

## 2018-03-08 DIAGNOSIS — N186 End stage renal disease: Secondary | ICD-10-CM | POA: Diagnosis not present

## 2018-03-08 DIAGNOSIS — N2581 Secondary hyperparathyroidism of renal origin: Secondary | ICD-10-CM | POA: Diagnosis not present

## 2018-03-09 DIAGNOSIS — N186 End stage renal disease: Secondary | ICD-10-CM | POA: Diagnosis not present

## 2018-03-09 DIAGNOSIS — T8610 Unspecified complication of kidney transplant: Secondary | ICD-10-CM | POA: Diagnosis not present

## 2018-03-09 DIAGNOSIS — Z992 Dependence on renal dialysis: Secondary | ICD-10-CM | POA: Diagnosis not present

## 2018-03-10 DIAGNOSIS — Z992 Dependence on renal dialysis: Secondary | ICD-10-CM | POA: Diagnosis not present

## 2018-03-10 DIAGNOSIS — N186 End stage renal disease: Secondary | ICD-10-CM | POA: Diagnosis not present

## 2018-03-10 DIAGNOSIS — T8610 Unspecified complication of kidney transplant: Secondary | ICD-10-CM | POA: Diagnosis not present

## 2018-03-11 DIAGNOSIS — Z992 Dependence on renal dialysis: Secondary | ICD-10-CM | POA: Diagnosis not present

## 2018-03-11 DIAGNOSIS — N2581 Secondary hyperparathyroidism of renal origin: Secondary | ICD-10-CM | POA: Diagnosis not present

## 2018-03-11 DIAGNOSIS — D631 Anemia in chronic kidney disease: Secondary | ICD-10-CM | POA: Diagnosis not present

## 2018-03-11 DIAGNOSIS — T8610 Unspecified complication of kidney transplant: Secondary | ICD-10-CM | POA: Diagnosis not present

## 2018-03-11 DIAGNOSIS — N186 End stage renal disease: Secondary | ICD-10-CM | POA: Diagnosis not present

## 2018-03-12 ENCOUNTER — Emergency Department: Payer: Medicare Other

## 2018-03-12 ENCOUNTER — Observation Stay
Admission: EM | Admit: 2018-03-12 | Discharge: 2018-03-14 | Disposition: A | Discharge status: Home / Self Care | Payer: Medicare Other | Attending: Internal Medicine | Admitting: Internal Medicine

## 2018-03-12 ENCOUNTER — Encounter: Payer: Self-pay | Admitting: Emergency Medicine

## 2018-03-12 DIAGNOSIS — Z7982 Long term (current) use of aspirin: Secondary | ICD-10-CM | POA: Diagnosis not present

## 2018-03-12 DIAGNOSIS — N186 End stage renal disease: Secondary | ICD-10-CM | POA: Diagnosis not present

## 2018-03-12 DIAGNOSIS — J181 Lobar pneumonia, unspecified organism: Principal | ICD-10-CM | POA: Insufficient documentation

## 2018-03-12 DIAGNOSIS — I132 Hypertensive heart and chronic kidney disease with heart failure and with stage 5 chronic kidney disease, or end stage renal disease: Secondary | ICD-10-CM | POA: Insufficient documentation

## 2018-03-12 DIAGNOSIS — Z79899 Other long term (current) drug therapy: Secondary | ICD-10-CM | POA: Insufficient documentation

## 2018-03-12 DIAGNOSIS — E871 Hypo-osmolality and hyponatremia: Secondary | ICD-10-CM | POA: Diagnosis not present

## 2018-03-12 DIAGNOSIS — R112 Nausea with vomiting, unspecified: Secondary | ICD-10-CM | POA: Diagnosis not present

## 2018-03-12 DIAGNOSIS — R7989 Other specified abnormal findings of blood chemistry: Secondary | ICD-10-CM

## 2018-03-12 DIAGNOSIS — E860 Dehydration: Secondary | ICD-10-CM | POA: Insufficient documentation

## 2018-03-12 DIAGNOSIS — Z992 Dependence on renal dialysis: Secondary | ICD-10-CM | POA: Insufficient documentation

## 2018-03-12 DIAGNOSIS — Z888 Allergy status to other drugs, medicaments and biological substances status: Secondary | ICD-10-CM | POA: Insufficient documentation

## 2018-03-12 DIAGNOSIS — Z940 Kidney transplant status: Secondary | ICD-10-CM | POA: Insufficient documentation

## 2018-03-12 DIAGNOSIS — J45909 Unspecified asthma, uncomplicated: Secondary | ICD-10-CM | POA: Diagnosis not present

## 2018-03-12 DIAGNOSIS — N2581 Secondary hyperparathyroidism of renal origin: Secondary | ICD-10-CM | POA: Diagnosis not present

## 2018-03-12 DIAGNOSIS — I5033 Acute on chronic diastolic (congestive) heart failure: Secondary | ICD-10-CM | POA: Insufficient documentation

## 2018-03-12 DIAGNOSIS — R748 Abnormal levels of other serum enzymes: Secondary | ICD-10-CM | POA: Diagnosis present

## 2018-03-12 DIAGNOSIS — D631 Anemia in chronic kidney disease: Secondary | ICD-10-CM | POA: Insufficient documentation

## 2018-03-12 DIAGNOSIS — J90 Pleural effusion, not elsewhere classified: Secondary | ICD-10-CM | POA: Diagnosis not present

## 2018-03-12 DIAGNOSIS — N179 Acute kidney failure, unspecified: Secondary | ICD-10-CM | POA: Diagnosis not present

## 2018-03-12 DIAGNOSIS — R778 Other specified abnormalities of plasma proteins: Secondary | ICD-10-CM

## 2018-03-12 DIAGNOSIS — R05 Cough: Secondary | ICD-10-CM

## 2018-03-12 DIAGNOSIS — E785 Hyperlipidemia, unspecified: Secondary | ICD-10-CM | POA: Insufficient documentation

## 2018-03-12 DIAGNOSIS — G4700 Insomnia, unspecified: Secondary | ICD-10-CM | POA: Diagnosis not present

## 2018-03-12 DIAGNOSIS — R509 Fever, unspecified: Secondary | ICD-10-CM

## 2018-03-12 DIAGNOSIS — G4733 Obstructive sleep apnea (adult) (pediatric): Secondary | ICD-10-CM | POA: Diagnosis not present

## 2018-03-12 DIAGNOSIS — R059 Cough, unspecified: Secondary | ICD-10-CM

## 2018-03-12 DIAGNOSIS — I700 Atherosclerosis of aorta: Secondary | ICD-10-CM | POA: Diagnosis not present

## 2018-03-12 LAB — COMPREHENSIVE METABOLIC PANEL
ALK PHOS: 52 U/L (ref 38–126)
ALT: 13 U/L — AB (ref 17–63)
ANION GAP: 13 (ref 5–15)
AST: 24 U/L (ref 15–41)
Albumin: 3.5 g/dL (ref 3.5–5.0)
BUN: 24 mg/dL — ABNORMAL HIGH (ref 6–20)
CALCIUM: 8.8 mg/dL — AB (ref 8.9–10.3)
CO2: 26 mmol/L (ref 22–32)
CREATININE: 10.87 mg/dL — AB (ref 0.61–1.24)
Chloride: 94 mmol/L — ABNORMAL LOW (ref 101–111)
GFR calc non Af Amer: 5 mL/min — ABNORMAL LOW (ref >60)
GFR, EST AFRICAN AMERICAN: 6 mL/min — AB (ref >60)
GLUCOSE: 106 mg/dL — AB (ref 65–99)
Potassium: 3.6 mmol/L (ref 3.5–5.1)
Sodium: 133 mmol/L — ABNORMAL LOW (ref 135–145)
TOTAL PROTEIN: 7.5 g/dL (ref 6.5–8.1)
Total Bilirubin: 1.4 mg/dL — ABNORMAL HIGH (ref 0.3–1.2)

## 2018-03-12 LAB — CBC
HEMATOCRIT: 28.1 % — AB (ref 40.0–52.0)
HEMOGLOBIN: 9.5 g/dL — AB (ref 13.0–18.0)
MCH: 30.2 pg (ref 26.0–34.0)
MCHC: 34 g/dL (ref 32.0–36.0)
MCV: 88.9 fL (ref 80.0–100.0)
Platelets: 94 10*3/uL — ABNORMAL LOW (ref 150–440)
RBC: 3.16 MIL/uL — ABNORMAL LOW (ref 4.40–5.90)
RDW: 17.1 % — ABNORMAL HIGH (ref 11.5–14.5)
WBC: 5.8 10*3/uL (ref 3.8–10.6)

## 2018-03-12 LAB — TROPONIN I: TROPONIN I: 0.03 ng/mL — AB (ref <0.03)

## 2018-03-12 LAB — LACTIC ACID, PLASMA: LACTIC ACID, VENOUS: 0.9 mmol/L (ref 0.5–1.9)

## 2018-03-12 MED ORDER — ONDANSETRON HCL 4 MG/2ML IJ SOLN
4.0000 mg | Freq: Once | INTRAMUSCULAR | Status: AC
  Administered 2018-03-12: 4 mg via INTRAVENOUS
  Filled 2018-03-12: qty 2

## 2018-03-12 MED ORDER — PIPERACILLIN-TAZOBACTAM 3.375 G IVPB 30 MIN
3.3750 g | Freq: Once | INTRAVENOUS | Status: AC
Start: 2018-03-12 — End: 2018-03-13
  Administered 2018-03-13: 3.375 g via INTRAVENOUS
  Filled 2018-03-12: qty 50

## 2018-03-12 MED ORDER — SODIUM CHLORIDE 0.9 % IV SOLN
1750.0000 mg | Freq: Once | INTRAVENOUS | Status: AC
Start: 2018-03-12 — End: 2018-03-13
  Administered 2018-03-13: 1750 mg via INTRAVENOUS
  Filled 2018-03-12 (×2): qty 1750

## 2018-03-12 NOTE — ED Provider Notes (Signed)
[facility] Emergency Department Provider Note  ____________________________________________  Time seen: Approximately 10:46 PM  I have reviewed the triage vital signs and the nursing notes.   HISTORY  Chief Complaint Weakness    HPI Jonathon Conley is a 52 y.o. male a history of ESRD on HD, HTN, presenting with more than 1 month of cough, nausea and vomiting, general malaise, now with fever.  The patient and his wife report that for over a month he has general malaise with nausea and vomiting after hemodialysis at least once per week.  His renal physicians have recently increased his dry weight. He has stopped taking a calcium supplement with dialysis, which has helped his symptoms.  Intermittently, he has had fevers with cough.  He was seen last week for fever and cough and was told he most likely had a viral syndrome.  He did get better until today, when he had fever to 101.0 at home, nausea and vomiting, and general malaise.  He has not taken any antipyretic or other medications for his symptoms.  Past Medical History:  Diagnosis Date  . Anemia   . Asthma    childhood  . Blood transfusion without reported diagnosis   . Chronic kidney disease    Dialysis Mon, Wed, Fri  . Colitis   . Diverticulosis 04/12/16   also seen: sigmoid and rectal erythema, path:   . Hemorrhoids 03/2016   bleeding at 04/12/16 colonoscopy, treated with  APC laser. cauterization  . Hyperlipidemia   . Hypertension   . OSA (obstructive sleep apnea) 03/27/2013    Patient Active Problem List   Diagnosis Date Noted  . Viral respiratory infection 03/05/2018  . Cough 11/14/2017  . Lower respiratory infection 11/14/2017  . History of fever 11/14/2017  . SIRS (systemic inflammatory response syndrome) ([place]) 12/19/2016  . End stage renal disease ([place]) 12/19/2016  . Hyponatremia 12/19/2016  . Anemia of chronic disease 12/19/2016  . Postprocedural seroma of a musculoskeletal structure  following other procedure 12/17/2016  . Hematochezia 04/08/2016  . HTN (hypertension) 04/08/2016  . Intravenous line infection ([place]) 04/08/2016  . Insomnia 01/04/2016  . Malignant hypertension 12/14/2015  . Symptomatic anemia 11/03/2015  . Chronic anemia 11/03/2015  . Thrombocytopenia ([place]) 11/03/2015  . Pleural effusion on right 11/03/2015  . Shortness of breath 11/02/2015  . Sepsis ([place]) 09/23/2015  . ESRD on dialysis ([place]) 09/22/2015  . Accelerated hypertension 09/22/2015  . Leg edema, left   . HCAP (healthcare-associated pneumonia) 09/21/2015  . Bacteremia due to Gram-positive bacteria 08/20/2015  . Acute blood loss anemia 08/12/2015  . C. difficile colitis 08/12/2015  . Bleeding gastrointestinal 08/12/2015  . Acute-on-chronic renal failure ([place]) 08/10/2015  . H/O kidney transplant 08/08/2015  . Non compliance with medical treatment 08/08/2015  . History of anemia 08/08/2015  . AKI (acute kidney injury) ([place]) 02/14/2015  . Renal transplant recipient 02/14/2015  . Acute on chronic diastolic congestive heart failure ([place]) 02/14/2015  . Anemia 02/14/2015  . Hyperlipidemia   . OSA (obstructive sleep apnea) 03/27/2013    Past Surgical History:  Procedure Laterality Date  . APPLICATION OF WOUND VAC Right 12/13/2016   Procedure: APPLICATION OF WOUND VAC;  Surgeon: Algernon Huxley, MD;  Location: [facility];  Service: Vascular;  Laterality: Right;  . APPLICATION OF WOUND VAC Right 12/19/2016   Procedure: APPLICATION OF WOUND VAC;  Surgeon: Algernon Huxley, MD;  Location: [facility];  Service: Vascular;  Laterality: Right;  . COLONOSCOPY N/A 04/12/2016   Procedure:  COLONOSCOPY;  Surgeon: Mauri Pole, MD;  Location: [facility];  Service: Endoscopy;  Laterality: N/A;  . DIALYSIS/PERMA CATHETER INSERTION N/A 02/26/2017   Procedure: Dialysis/Perma Catheter Insertion;  Surgeon: Algernon Huxley, MD;  Location: [place];  Service: Cardiovascular;  Laterality: N/A;  . EXCHANGE OF A  DIALYSIS CATHETER  11/20/2016   Procedure: Exchange Of A Dialysis Catheter;  Surgeon: Algernon Huxley, MD;  Location: [place] [facility];  Service: Cardiovascular;;  . fistulagrams Right   . HEMATOMA EVACUATION Right 12/13/2016   Procedure: EVACUATION HEMATOMA;  Surgeon: Algernon Huxley, MD;  Location: [facility];  Service: Vascular;  Laterality: Right;  . HEMATOMA EVACUATION Right 12/19/2016   Procedure: EVACUATIONof seroma;  Surgeon: Algernon Huxley, MD;  Location: [facility];  Service: Vascular;  Laterality: Right;  . HOT HEMOSTASIS N/A 04/12/2016   Procedure: HOT HEMOSTASIS (ARGON PLASMA COAGULATION/BICAP);  Surgeon: Mauri Pole, MD;  Location: [facility];  Service: Endoscopy;  Laterality: N/A;  . KIDNEY TRANSPLANT    . PERIPHERAL VASCULAR CATHETERIZATION Bilateral 10/02/2016   Procedure: Upper Extremity Venography;  Surgeon: Algernon Huxley, MD;  Location: [facility];  Service: Cardiovascular;  Laterality: Bilateral;  . PERIPHERAL VASCULAR CATHETERIZATION Right 11/08/2016   Procedure: DIALYSIS/PERMA CATHETER REMOVAL right Jugular;  Surgeon: Algernon Huxley, MD;  Location: [facility];  Service: Vascular;  Laterality: Right;  . PERIPHERAL VASCULAR CATHETERIZATION Left 11/08/2016   Procedure: DIALYSIS/PERMA CATHETER INSERTION left jugular with u/s guide and flouroscan;  Surgeon: Algernon Huxley, MD;  Location: [facility];  Service: Vascular;  Laterality: Left;  . PERIPHERAL VASCULAR CATHETERIZATION N/A 11/08/2016   Procedure: IVC FILTER INSERTION;  Surgeon: Algernon Huxley, MD;  Location: [facility];  Service: Vascular;  Laterality: N/A;  . perm a cath in Rt upper chest Right   . THORACENTESIS    . VASCULAR ACCESS DEVICE INSERTION Right 11/08/2016   Procedure: INSERTION OF HERO VASCULAR ACCESS DEVICE ( GRAFT );  Surgeon: Algernon Huxley, MD;  Location: [facility];  Service: Vascular;  Laterality: Right;    Current Outpatient Rx  . Order #: [id number] Class: Historical Med  . Order #: [id number] Class: Historical Med  .  Order #: [id number] Class: Print  . Order #: [id number] Class: Historical Med  . Order #: [id number] Class: Historical Med  . Order #: [id number] Class: Historical Med  . Order #: [id number] Class: Historical Med  . Order #: [id number] Class: Normal  . Order #: [id number] Class: Print  . Order #: [id number] Class: Print  . Order #: [id number] Class: Historical Med    Allergies Lisinopril  Family History  Problem Relation Age of Onset  . Heart disease Mother   . Cancer Mother        ovarian  . Cancer Maternal Grandmother   . Cancer Maternal Grandfather   . Asthma Son   . Asthma Son     Social History Social History   Tobacco Use  . Smoking status: Never Smoker  . Smokeless tobacco: Never Used  Substance Use Topics  . Alcohol use: No  . Drug use: No    Review of Systems Constitutional: Positive fever to 101.0.  Positive general malaise. Eyes: No visual changes.  No eye discharge. ENT: No sore throat. No congestion or rhinorrhea. Cardiovascular: Denies chest pain. Denies palpitations. Respiratory: Denies shortness of breath.  Positive cough. Gastrointestinal: No abdominal pain.  Positive nausea, positive vomiting.  No diarrhea.  No constipation.  His bowel movement was at 1 PM and it was normal.  Positive  mild distention. Genitourinary: No longer produces urine. Musculoskeletal: Negative for back pain. Skin: Negative for rash. Neurological: Negative for headaches. No focal numbness, tingling or weakness.  Hematological/Lymphatic:Positive left chest PermCath    ____________________________________________   PHYSICAL EXAM:  VITAL SIGNS: ED Triage Vitals [03/12/18 2212]  Enc Vitals Group     BP 130/90     Pulse Rate 88     Resp 16     Temp 99.4 F (37.4 C)     Temp Source Oral     SpO2 96 %     Weight      Height      Head Circumference      Peak Flow      Pain Score      Pain Loc      Pain Edu?      Excl. in [place]?     Constitutional: The patient is alert, oriented,  and answers questions appropriately.  He is chronically ill-appearing, but nontoxic.  His vital signs are reassuring.. Eyes: Conjunctivae are normal.  EOMI. No scleral icterus.  No eye discharge. Head: Atraumatic. Nose: No congestion/rhinnorhea. Mouth/Throat: Mucous membranes are dry.  Neck: No stridor.  Supple.  Positive JVD.  No meningismus. Cardiovascular: Normal rate, regular rhythm. No murmurs, rubs or gallops.  Left chest PermCath without any surrounding erythema, purulence or tenderness to palpation on my examination. Respiratory: Normal respiratory effort.  No accessory muscle use or retractions. Lungs CTAB.  No wheezes, rales or ronchi. Gastrointestinal: Soft, nontender and nondistended.  No guarding or rebound.  No peritoneal signs. Musculoskeletal: No LE edema. No ttp in the calves or palpable cords.  Negative Homan's sign. Neurologic:  A&Ox3.  Speech is clear.  Face and smile are symmetric.  EOMI.  Moves all extremities well. Skin:  Skin is warm and I repeated his temperature which is 99.1, dry and intact. No rash noted. Psychiatric: Mood and affect are normal. Speech and behavior are normal.  Normal judgement.  ____________________________________________   LABS (all labs ordered are listed, but only abnormal results are displayed)  Labs Reviewed  CULTURE, BLOOD (ROUTINE X 2)  CULTURE, BLOOD (ROUTINE X 2)  CBC  COMPREHENSIVE METABOLIC PANEL  BRAIN NATRIURETIC PEPTIDE  TROPONIN I  LACTIC ACID, PLASMA  LACTIC ACID, PLASMA   ____________________________________________  EKG  ED ECG REPORT I, Eula Listen, the attending physician, personally viewed and interpreted this ECG.   Date: 03/12/2018  EKG Time: 2206  Rate: 82  Rhythm: normal sinus rhythm  Axis: normal  Intervals:prolonged QTc  ST&T Change: No STEMI  ____________________________________________  RADIOLOGY  No results  found.  ____________________________________________   PROCEDURES  Procedure(s) performed: None  Procedures  Critical Care performed: No ____________________________________________   INITIAL IMPRESSION / ASSESSMENT AND PLAN / ED COURSE  Pertinent labs & imaging results that were available during my care of the patient were reviewed by me and considered in my medical decision making (see chart for details).  52 y.o. male with over a month of intermittent nausea and vomiting, cough, and fevers, most notably after dialysis.  Overall, here the patient is hemodynamically stable.  Overall he has general malaise but has no focal findings on my examination.  He is at risk for bacteremia, especially given that he has a PermCath in place although the PermCath itself looks good.  We will also evaluate for pneumonia given his cough.  The patient does not have any evidence of meningismus today.  I will initiate empiric antibiotics and admit the patient  to the hospital for further evaluation and treatment.  ----------------------------------------- 12:13 AM on 03/13/2018 -----------------------------------------  The patient's work-up does not show any evidence of pneumonia.  His blood cell count is normal.  He does have an elevated troponin of 0.03 and this will need to be trended.  And aspirin has been ordered.  At this time, the patient will be admitted to the hospital for continued evaluation and treatment.  ____________________________________________  FINAL CLINICAL IMPRESSION(S) / ED DIAGNOSES  Final diagnoses:  Fever, unspecified fever cause  Cough  Nausea and vomiting, intractability of vomiting not specified, unspecified vomiting type         NEW MEDICATIONS STARTED DURING THIS VISIT:  New Prescriptions   No medications on file      Eula Listen, MD 03/13/18 [id number]

## 2018-03-12 NOTE — ED Triage Notes (Signed)
Pt reports he had dialysis on Monday, today has had fever and increased weakness. Pt is diaphoretic, sweat dripping down face. Pt taken to [facility]. MD made aware.

## 2018-03-13 ENCOUNTER — Other Ambulatory Visit: Payer: Self-pay

## 2018-03-13 ENCOUNTER — Encounter: Payer: Self-pay | Admitting: Internal Medicine

## 2018-03-13 ENCOUNTER — Observation Stay: Payer: Medicare Other

## 2018-03-13 DIAGNOSIS — R509 Fever, unspecified: Secondary | ICD-10-CM | POA: Diagnosis present

## 2018-03-13 DIAGNOSIS — D631 Anemia in chronic kidney disease: Secondary | ICD-10-CM | POA: Diagnosis not present

## 2018-03-13 DIAGNOSIS — N186 End stage renal disease: Secondary | ICD-10-CM | POA: Diagnosis not present

## 2018-03-13 DIAGNOSIS — J90 Pleural effusion, not elsewhere classified: Secondary | ICD-10-CM | POA: Diagnosis not present

## 2018-03-13 DIAGNOSIS — N2581 Secondary hyperparathyroidism of renal origin: Secondary | ICD-10-CM | POA: Diagnosis not present

## 2018-03-13 LAB — RENAL FUNCTION PANEL
ALBUMIN: 3.1 g/dL — AB (ref 3.5–5.0)
ANION GAP: 13 (ref 5–15)
BUN: 34 mg/dL — AB (ref 6–20)
CO2: 29 mmol/L (ref 22–32)
Calcium: 8.6 mg/dL — ABNORMAL LOW (ref 8.9–10.3)
Chloride: 92 mmol/L — ABNORMAL LOW (ref 101–111)
Creatinine, Ser: 12.91 mg/dL — ABNORMAL HIGH (ref 0.61–1.24)
GFR calc Af Amer: 4 mL/min — ABNORMAL LOW (ref >60)
GFR calc non Af Amer: 4 mL/min — ABNORMAL LOW (ref >60)
GLUCOSE: 87 mg/dL (ref 65–99)
POTASSIUM: 3.8 mmol/L (ref 3.5–5.1)
Phosphorus: 3.7 mg/dL (ref 2.5–4.6)
Sodium: 134 mmol/L — ABNORMAL LOW (ref 135–145)

## 2018-03-13 LAB — CBC
HEMATOCRIT: 27.2 % — AB (ref 40.0–52.0)
HEMOGLOBIN: 9.4 g/dL — AB (ref 13.0–18.0)
MCH: 30.4 pg (ref 26.0–34.0)
MCHC: 34.5 g/dL (ref 32.0–36.0)
MCV: 88 fL (ref 80.0–100.0)
Platelets: 77 10*3/uL — ABNORMAL LOW (ref 150–440)
RBC: 3.09 MIL/uL — ABNORMAL LOW (ref 4.40–5.90)
RDW: 17 % — AB (ref 11.5–14.5)
WBC: 4.1 10*3/uL (ref 3.8–10.6)

## 2018-03-13 LAB — MRSA PCR SCREENING: MRSA BY PCR: NEGATIVE

## 2018-03-13 LAB — PROCALCITONIN: Procalcitonin: 6.86 ng/mL

## 2018-03-13 LAB — BRAIN NATRIURETIC PEPTIDE: B Natriuretic Peptide: 1392 pg/mL — ABNORMAL HIGH (ref 0.0–100.0)

## 2018-03-13 MED ORDER — CALCITRIOL 0.25 MCG PO CAPS
0.5000 ug | ORAL_CAPSULE | ORAL | Status: DC
  Filled 2018-03-13: qty 2

## 2018-03-13 MED ORDER — SIMVASTATIN 10 MG PO TABS
5.0000 mg | ORAL_TABLET | Freq: Every day | ORAL | Status: DC
  Administered 2018-03-13: 5 mg via ORAL
  Filled 2018-03-13 (×3): qty 0.5

## 2018-03-13 MED ORDER — HEPARIN SODIUM (PORCINE) 1000 UNIT/ML DIALYSIS
1000.0000 [IU] | INTRAMUSCULAR | Status: DC | PRN
  Filled 2018-03-13: qty 1

## 2018-03-13 MED ORDER — VANCOMYCIN HCL IN DEXTROSE 1-5 GM/200ML-% IV SOLN
1000.0000 mg | INTRAVENOUS | Status: DC
  Administered 2018-03-13: 1000 mg via INTRAVENOUS
  Filled 2018-03-13 (×2): qty 200

## 2018-03-13 MED ORDER — SODIUM CHLORIDE 0.9 % IV SOLN: 100.0000 mL | INTRAVENOUS | Status: DC | PRN

## 2018-03-13 MED ORDER — SENNOSIDES-DOCUSATE SODIUM 8.6-50 MG PO TABS: 1.0000 | ORAL_TABLET | Freq: Every evening | ORAL | Status: DC | PRN

## 2018-03-13 MED ORDER — MESALAMINE 400 MG PO CPDR
800.0000 mg | DELAYED_RELEASE_CAPSULE | Freq: Three times a day (TID) | ORAL | Status: DC
  Filled 2018-03-13 (×6): qty 2

## 2018-03-13 MED ORDER — ASPIRIN EC 81 MG PO TBEC
81.0000 mg | DELAYED_RELEASE_TABLET | Freq: Every day | ORAL | Status: DC
  Administered 2018-03-14: 81 mg via ORAL
  Filled 2018-03-13 (×2): qty 1

## 2018-03-13 MED ORDER — RENA-VITE PO TABS
1.0000 | ORAL_TABLET | Freq: Every day | ORAL | Status: DC
Start: 2018-03-13 — End: 2018-03-14
  Administered 2018-03-13: 1 via ORAL
  Filled 2018-03-13: qty 1

## 2018-03-13 MED ORDER — CINACALCET HCL 30 MG PO TABS
30.0000 mg | ORAL_TABLET | ORAL | Status: DC
  Filled 2018-03-13: qty 1

## 2018-03-13 MED ORDER — OXYCODONE-ACETAMINOPHEN 7.5-325 MG PO TABS
1.0000 | ORAL_TABLET | ORAL | Status: DC | PRN
  Administered 2018-03-14: 1 via ORAL
  Filled 2018-03-13: qty 1

## 2018-03-13 MED ORDER — ASPIRIN 81 MG PO CHEW
324.0000 mg | CHEWABLE_TABLET | Freq: Once | ORAL | Status: AC
  Administered 2018-03-13: 324 mg via ORAL
  Filled 2018-03-13: qty 4

## 2018-03-13 MED ORDER — CALCIUM ACETATE (PHOS BINDER) 667 MG PO CAPS
667.0000 mg | ORAL_CAPSULE | Freq: Three times a day (TID) | ORAL | Status: DC
  Administered 2018-03-13 – 2018-03-14 (×3): 667 mg via ORAL
  Filled 2018-03-13 (×3): qty 1

## 2018-03-13 MED ORDER — FERROUS SULFATE 325 (65 FE) MG PO TABS
325.0000 mg | ORAL_TABLET | Freq: Three times a day (TID) | ORAL | Status: DC
  Administered 2018-03-13 – 2018-03-14 (×4): 325 mg via ORAL
  Filled 2018-03-13 (×4): qty 1

## 2018-03-13 MED ORDER — GUAIFENESIN-DM 100-10 MG/5ML PO SYRP
5.0000 mL | ORAL_SOLUTION | ORAL | Status: DC | PRN
  Administered 2018-03-13 (×2): 5 mL via ORAL
  Filled 2018-03-13 (×2): qty 5

## 2018-03-13 MED ORDER — ASPIRIN EC 81 MG PO TBEC: 81.0000 mg | DELAYED_RELEASE_TABLET | Freq: Every day | ORAL | Status: DC

## 2018-03-13 MED ORDER — LIDOCAINE HCL (PF) 1 % IJ SOLN
5.0000 mL | INTRAMUSCULAR | Status: DC | PRN
  Filled 2018-03-13: qty 5

## 2018-03-13 MED ORDER — ACETAMINOPHEN 325 MG PO TABS: 650.0000 mg | ORAL_TABLET | Freq: Four times a day (QID) | ORAL | Status: DC | PRN

## 2018-03-13 MED ORDER — AMLODIPINE BESYLATE 10 MG PO TABS
10.0000 mg | ORAL_TABLET | Freq: Every day | ORAL | Status: DC
  Administered 2018-03-13 – 2018-03-14 (×2): 10 mg via ORAL
  Filled 2018-03-13 (×2): qty 1

## 2018-03-13 MED ORDER — CARVEDILOL 25 MG PO TABS
25.0000 mg | ORAL_TABLET | Freq: Two times a day (BID) | ORAL | Status: DC
  Administered 2018-03-13 – 2018-03-14 (×3): 25 mg via ORAL
  Filled 2018-03-13 (×3): qty 1

## 2018-03-13 MED ORDER — ALTEPLASE 2 MG IJ SOLR: 2.0000 mg | Freq: Once | INTRAMUSCULAR | Status: DC | PRN

## 2018-03-13 MED ORDER — PENTAFLUOROPROP-TETRAFLUOROETH EX AERO
1.0000 "application " | INHALATION_SPRAY | CUTANEOUS | Status: DC | PRN
  Filled 2018-03-13: qty 30

## 2018-03-13 MED ORDER — SPIRONOLACTONE 25 MG PO TABS
25.0000 mg | ORAL_TABLET | Freq: Every day | ORAL | Status: DC
  Administered 2018-03-13 – 2018-03-14 (×2): 25 mg via ORAL
  Filled 2018-03-13 (×2): qty 1

## 2018-03-13 MED ORDER — ACETAMINOPHEN 650 MG RE SUPP: 650.0000 mg | Freq: Four times a day (QID) | RECTAL | Status: DC | PRN

## 2018-03-13 MED ORDER — NEPRO/CARBSTEADY PO LIQD
237.0000 mL | Freq: Two times a day (BID) | ORAL | Status: DC
  Administered 2018-03-13 – 2018-03-14 (×3): 237 mL via ORAL

## 2018-03-13 MED ORDER — BISACODYL 5 MG PO TBEC: 5.0000 mg | DELAYED_RELEASE_TABLET | Freq: Every day | ORAL | Status: DC | PRN

## 2018-03-13 MED ORDER — HEPARIN SODIUM (PORCINE) 5000 UNIT/ML IJ SOLN
5000.0000 [IU] | Freq: Three times a day (TID) | INTRAMUSCULAR | Status: DC
  Filled 2018-03-13 (×2): qty 1

## 2018-03-13 MED ORDER — PIPERACILLIN-TAZOBACTAM 3.375 G IVPB
3.3750 g | Freq: Two times a day (BID) | INTRAVENOUS | Status: DC
  Administered 2018-03-13 – 2018-03-14 (×3): 3.375 g via INTRAVENOUS
  Filled 2018-03-13 (×3): qty 50

## 2018-03-13 MED ORDER — ONDANSETRON HCL 4 MG PO TABS: 4.0000 mg | ORAL_TABLET | Freq: Four times a day (QID) | ORAL | Status: DC | PRN

## 2018-03-13 MED ORDER — ONDANSETRON HCL 4 MG/2ML IJ SOLN: 4.0000 mg | Freq: Four times a day (QID) | INTRAMUSCULAR | Status: DC | PRN

## 2018-03-13 MED ORDER — LIDOCAINE-PRILOCAINE 2.5-2.5 % EX CREA
1.0000 "application " | TOPICAL_CREAM | CUTANEOUS | Status: DC | PRN
  Filled 2018-03-13: qty 5

## 2018-03-13 NOTE — Progress Notes (Signed)
Post HD assessment    03/13/18 2111  Neurological  Level of Consciousness Alert  Orientation Level Oriented X4  Respiratory  Respiratory Pattern Regular;Unlabored  Chest Assessment Chest expansion symmetrical  Cardiac  ECG Monitor Yes  Vascular  R Radial Pulse +2  L Radial Pulse +2  Edema Generalized  Integumentary  Integumentary (WDL) X  Skin Color Appropriate for ethnicity  Musculoskeletal  Musculoskeletal (WDL) X  Generalized Weakness Yes  Assistive Device None  GU Assessment  Genitourinary (WDL) X  Genitourinary Symptoms  (HD)  Psychosocial  Psychosocial (WDL) WDL

## 2018-03-13 NOTE — H&P (Addendum)
[facility] [place] NAME: Jonathon Conley    MR#:  [id number]  DATE OF BIRTH:  [birth year]  DATE OF ADMISSION:  03/12/2018  PRIMARY CARE PHYSICIAN: Tresa Garter, MD   REQUESTING/REFERRING PHYSICIAN: Eula Listen, MD  CHIEF COMPLAINT:   Chief Complaint  Patient presents with  . Weakness    HISTORY OF PRESENT ILLNESS:  Jonathon Conley  is a 52 y.o. male with a known history of ESRD (MWF) p/w c/o fatigue/malaise/generalized weakness, fever/chills, nausea/vomiting. He receives HD via tunneled catheter. He states he typically has aforementioned symptoms after an HD session. He is presenting to the hospital because he had these symptoms on a non-HD day. He states he felt weak all day Tuesday (05/28), fatigue/malaise/generalized weakness, decreased exercise tolerance. Developed chills @~1700PM, N/V x1 (clear fluid) shortly thereafter. Went back to bed, continued to have chills. Wife came home, checked temperature, 101F, brought pt to hospital. Endorses diaphoresis in triage. Feels dehydrated. Denies diarrhea, AP, CP, SOB, night sweats, rigors, HA, blurred vision, LH, LOC. VSS, afebrile in ED, (-) leukocytosis, SIRS (-).  PAST MEDICAL HISTORY:   Past Medical History:  Diagnosis Date  . Anemia   . Asthma    childhood  . Blood transfusion without reported diagnosis   . Chronic kidney disease    Dialysis Mon, Wed, Fri  . Colitis   . Diverticulosis 04/12/16   also seen: sigmoid and rectal erythema, path:   . Hemorrhoids 03/2016   bleeding at 04/12/16 colonoscopy, treated with  APC laser. cauterization  . Hyperlipidemia   . Hypertension   . OSA (obstructive sleep apnea) 03/27/2013    PAST SURGICAL HISTORY:   Past Surgical History:  Procedure Laterality Date  . APPLICATION OF WOUND VAC Right 12/13/2016   Procedure: APPLICATION OF WOUND VAC;  Surgeon: Algernon Huxley, MD;  Location: [facility];  Service: Vascular;  Laterality: Right;  .  APPLICATION OF WOUND VAC Right 12/19/2016   Procedure: APPLICATION OF WOUND VAC;  Surgeon: Algernon Huxley, MD;  Location: [facility];  Service: Vascular;  Laterality: Right;  . COLONOSCOPY N/A 04/12/2016   Procedure: COLONOSCOPY;  Surgeon: Mauri Pole, MD;  Location: [facility];  Service: Endoscopy;  Laterality: N/A;  . DIALYSIS/PERMA CATHETER INSERTION N/A 02/26/2017   Procedure: Dialysis/Perma Catheter Insertion;  Surgeon: Algernon Huxley, MD;  Location: [facility];  Service: Cardiovascular;  Laterality: N/A;  . EXCHANGE OF A DIALYSIS CATHETER  11/20/2016   Procedure: Exchange Of A Dialysis Catheter;  Surgeon: Algernon Huxley, MD;  Location: [facility];  Service: Cardiovascular;;  . fistulagrams Right   . HEMATOMA EVACUATION Right 12/13/2016   Procedure: EVACUATION HEMATOMA;  Surgeon: Algernon Huxley, MD;  Location: [facility];  Service: Vascular;  Laterality: Right;  . HEMATOMA EVACUATION Right 12/19/2016   Procedure: EVACUATIONof seroma;  Surgeon: Algernon Huxley, MD;  Location: [facility];  Service: Vascular;  Laterality: Right;  . HOT HEMOSTASIS N/A 04/12/2016   Procedure: HOT HEMOSTASIS (ARGON PLASMA COAGULATION/BICAP);  Surgeon: Mauri Pole, MD;  Location: [facility];  Service: Endoscopy;  Laterality: N/A;  . KIDNEY TRANSPLANT    . PERIPHERAL VASCULAR CATHETERIZATION Bilateral 10/02/2016   Procedure: Upper Extremity Venography;  Surgeon: Algernon Huxley, MD;  Location: [facility];  Service: Cardiovascular;  Laterality: Bilateral;  . PERIPHERAL VASCULAR CATHETERIZATION Right 11/08/2016   Procedure: DIALYSIS/PERMA CATHETER REMOVAL right Jugular;  Surgeon: Algernon Huxley, MD;  Location: [facility];  Service: Vascular;  Laterality: Right;  . PERIPHERAL VASCULAR CATHETERIZATION Left 11/08/2016   Procedure: DIALYSIS/PERMA CATHETER INSERTION left jugular with u/s guide and flouroscan;  Surgeon: Algernon Huxley, MD;  Location: [facility];  Service: Vascular;  Laterality: Left;  . PERIPHERAL  VASCULAR CATHETERIZATION N/A 11/08/2016   Procedure: IVC FILTER INSERTION;  Surgeon: Algernon Huxley, MD;  Location: [facility];  Service: Vascular;  Laterality: N/A;  . perm a cath in Rt upper chest Right   . THORACENTESIS    . VASCULAR ACCESS DEVICE INSERTION Right 11/08/2016   Procedure: INSERTION OF HERO VASCULAR ACCESS DEVICE ( GRAFT );  Surgeon: Algernon Huxley, MD;  Location: [facility];  Service: Vascular;  Laterality: Right;    SOCIAL HISTORY:   Social History   Tobacco Use  . Smoking status: Never Smoker  . Smokeless tobacco: Never Used  Substance Use Topics  . Alcohol use: No    FAMILY HISTORY:   Family History  Problem Relation Age of Onset  . Heart disease Mother   . Cancer Mother        ovarian  . Cancer Maternal Grandmother   . Cancer Maternal Grandfather   . Asthma Son   . Asthma Son     DRUG ALLERGIES:   Allergies  Allergen Reactions  . Lisinopril Swelling    Facial swelling    REVIEW OF SYSTEMS:   Review of Systems  Constitutional: Positive for chills, diaphoresis, fever and malaise/fatigue. Negative for weight loss.  HENT: Negative for congestion, ear pain, hearing loss, nosebleeds, sinus pain, sore throat and tinnitus.   Eyes: Negative for blurred vision, double vision and photophobia.  Respiratory: Positive for cough. Negative for hemoptysis, sputum production, shortness of breath and wheezing.   Cardiovascular: Negative for chest pain, palpitations, orthopnea, claudication, leg swelling and PND.  Gastrointestinal: Positive for nausea and vomiting. Negative for abdominal pain, blood in stool, constipation, diarrhea, heartburn and melena.  Musculoskeletal: Negative for back pain, joint pain, myalgias and neck pain.  Skin: Negative for itching and rash.  Neurological: Positive for weakness. Negative for dizziness, tingling, tremors, sensory change, speech change, focal weakness, seizures, loss of consciousness and headaches.  Psychiatric/Behavioral: Negative  for memory loss. The patient does not have insomnia.    MEDICATIONS AT HOME:   Prior to Admission medications   Medication Sig Start Date End Date Taking? Authorizing Provider  amLODipine (NORVASC) 10 MG tablet Take 10 mg by mouth daily.   Yes [provider]  aspirin EC 81 MG tablet Take 81 mg by mouth daily.    Yes [provider]  calcitRIOL (ROCALTROL) 0.5 MCG capsule Take 1 capsule (0.5 mcg total) by mouth Every Tuesday,Thursday,and Saturday with dialysis. 09/24/15  Yes TatShanon Brow, MD  calcium acetate (PHOSLO) 667 MG capsule Take 667 mg by mouth 3 (three) times daily with meals.   Yes [provider]  carvedilol (COREG) 25 MG tablet Take 25 mg by mouth 2 (two) times daily.    Yes [provider]  ferrous sulfate 325 (65 FE) MG tablet Take 325 mg by mouth 3 (three) times daily with meals. Reported on 04/09/2016   Yes [provider]  Mesalamine 800 MG TBEC Take 1 tablet (800 mg total) by mouth 3 (three) times daily. 05/03/16  Yes Esterwood, Amy S, PA-C  simvastatin (ZOCOR) 5 MG tablet Take 1 tablet (5 mg total) by mouth at bedtime. 02/17/15  Yes Ghimire, Henreitta Leber, MD  spironolactone (ALDACTONE) 25 MG tablet Take 25 mg  by mouth daily.    Yes [provider]  cinacalcet (SENSIPAR) 30 MG tablet Take 30 mg by mouth 3 (three) times a week. Wyvonnia Lora, Sat after dialysis    [provider]  oxyCODONE-acetaminophen (PERCOCET) 7.5-325 MG tablet Take 1 tablet by mouth every 4 (four) hours as needed for severe pain. Patient not taking: Reported on 03/12/2018 12/13/16   Algernon Huxley, MD      VITAL SIGNS:  Blood pressure (!) 149/90, pulse 80, temperature 99.4 F (37.4 C), temperature source Oral, resp. rate 18, SpO2 98 %.  PHYSICAL EXAMINATION:  Physical Exam  Constitutional: He is oriented to person, place, and time. He appears well-developed and well-nourished. He is active and cooperative.  Non-toxic appearance. He does not have a sickly  appearance. No distress. He is not intubated.  HENT:  Head: Normocephalic and atraumatic.  Mouth/Throat: Oropharynx is clear and moist. Mucous membranes are dry. No oropharyngeal exudate.  Eyes: Pupils are equal, round, and reactive to light. Conjunctivae and lids are normal. No scleral icterus.  Neck: Neck supple. No JVD present. No thyromegaly present.  Cardiovascular: Normal rate, regular rhythm, S1 normal, S2 normal and normal heart sounds.  No extrasystoles are present. Exam reveals no gallop, no S3, no S4, no distant heart sounds and no friction rub.  No murmur heard. Pulmonary/Chest: Effort normal and breath sounds normal. No accessory muscle usage or stridor. No apnea, no tachypnea and no bradypnea. He is not intubated. No respiratory distress. He has no decreased breath sounds. He has no wheezes. He has no rhonchi. He has no rales.  Abdominal: Soft. Bowel sounds are normal. He exhibits no distension. There is no tenderness. There is no rebound and no guarding.  Musculoskeletal: Normal range of motion. He exhibits no edema or tenderness.  Lymphadenopathy:    He has no cervical adenopathy.  Neurological: He is alert and oriented to person, place, and time. He is not disoriented.  Skin: Skin is warm, dry and intact. No rash noted. He is not diaphoretic. No erythema.  Psychiatric: He has a normal mood and affect. His speech is normal and behavior is normal. Judgment and thought content normal. Cognition and memory are normal.   (+) tunneled HD catheter. LABORATORY PANEL:   CBC Recent Labs  Lab 03/12/18 2220  WBC 5.8  HGB 9.5*  HCT 28.1*  PLT 94*   ------------------------------------------------------------------------------------------------------------------  Chemistries  Recent Labs  Lab 03/12/18 2220  NA 133*  K 3.6  CL 94*  CO2 26  GLUCOSE 106*  BUN 24*  CREATININE 10.87*  CALCIUM 8.8*  AST 24  ALT 13*  ALKPHOS 52  BILITOT 1.4*    ------------------------------------------------------------------------------------------------------------------  Cardiac Enzymes Recent Labs  Lab 03/12/18 2220  TROPONINI 0.03*   ------------------------------------------------------------------------------------------------------------------  RADIOLOGY:  Dg Chest 2 View  Result Date: 03/13/2018 CLINICAL DATA:  Fever, weakness, on dialysis EXAM: CHEST - 2 VIEW COMPARISON:  03/01/2018 FINDINGS: Right lower lobe scarring/volume loss. Small right pleural effusion. Left lung is clear. No pneumothorax. The heart is normal in size. Left chest dual lumen dialysis catheter terminating at the cavoatrial junction. Right IJ dialysis graft terminating in the upper right atrium. IMPRESSION: Small right pleural effusion with chronic changes in the right lower hemithorax. No evidence of acute cardiopulmonary disease. Electronically Signed   By: Julian Hy M.D.   On: 03/13/2018 00:00   IMPRESSION AND PLAN:   A/P: 60M fatigue/malaise/generalized weakness, fever (FUO)/chills, nausea/vomiting, ESRD (HD MWF), hyponatremia, hyperglycemia, azotemia, troponin elevation, normocytic anemia,  thrombocytopenia. -VSS -SIRS (-) -Lactate 0.9 -CXR (-) -BCx pending -Ordered for broad-spectrum ABx in ED -Pt appears fatigued and dehydrated, but does not appear septic/toxic to me. I have not ordered for any further ABx beyond the initial dose ordered in the ED. Will await culture results and procalcitonin. I am not presently convinced pt has an active/acute bacterial infxn, hence my reluctance. That said, pt is certainly at risk for infxn/bacteriemia given his ESRD and presence of tunneled HD catheter -Symptomatic mgmt -Nephrology for HD -Troponin marginally elevated at 0.03, 2/2 impaired renal clearance. C/w ASA, statin, beta blocker, Aldactone. Documented allergy to Lisinopril. -Normocytic anemia, likely anemia of chronic (kidney) disease, no evidence of  acute blood loss -Plt decreased from 153 to 94 since 01/28/2018, etiology unclear, monitor -c/w home meds -Renal diet -Heparin -Full code -Observation, < 2 midnights   All the records are reviewed and case discussed with ED provider. Management plans discussed with the patient, family and they are in agreement.  CODE STATUS: Full code  TOTAL TIME TAKING CARE OF THIS PATIENT: 60 minutes.    Arta Silence M.D on 03/13/2018 at 12:56 AM  Between 7am to 6pm - Pager - [phone]  After 6pm go to [email or web address] - Proofreader  [facility]  Office  [phone]  CC: Primary care physician; Tresa Garter, MD   Note: This dictation was prepared with Dragon dictation along with smaller phrase technology. Any transcriptional errors that result from this process are unintentional.

## 2018-03-13 NOTE — Progress Notes (Signed)
Pre HD assessment   03/13/18 1715  Vital Signs  Temp 99 F (37.2 C)  Temp Source Oral  Pulse Rate 65  Pulse Rate Source Monitor  Resp 17  BP (!) 148/73  BP Location Left Arm  BP Method Automatic  Patient Position (if appropriate) Lying  Oxygen Therapy  SpO2 98 %  O2 Device Room Air  Pain Assessment  Pain Scale 0-10  Pain Score 0  Dialysis Weight  Weight 85.2 kg (187 lb 13.3 oz)  Type of Weight Pre-Dialysis  Time-Out for Hemodialysis  What Procedure? HD  Pt Identifiers(min of two) First/Last Name;MRN/Account#  Correct Site? Yes  Correct Side? Yes  Correct Procedure? Yes  Consents Verified? Yes  Rad Studies Available? N/A  Safety Precautions Reviewed? Yes  Engineer, civil (consulting) Number  ([id number])  Station Number 2  UF/Alarm Test Passed  Conductivity: Meter 13.8  Conductivity: Machine  13.9  pH 7.6  Reverse Osmosis main  Normal Saline Lot Number [id number]  Dialyzer Lot Number [id number]  Disposable Set Lot Number [id number]  Machine Temperature 98.6 F (37 C)  Musician and Audible Yes  Blood Lines Intact and Secured Yes  Pre Treatment Patient Checks  Vascular access used during treatment Catheter  Hepatitis B Surface Antigen Results Negative  Date Hepatitis B Surface Antigen Drawn 02/20/18  Hepatitis B Surface Antibody 119  Date Hepatitis B Surface Antibody Drawn 02/20/18  Hemodialysis Consent Verified Yes  Hemodialysis Standing Orders Initiated Yes  ECG (Telemetry) Monitor On Yes  Prime Ordered Normal Saline  Length of  DialysisTreatment -hour(s) 3.5 Hour(s)  Dialyzer Elisio 17H NR  Dialysate 2K, 2.5 Ca  Dialysis Anticoagulant None  Dialysate Flow Ordered 800  Blood Flow Rate Ordered 400 mL/min  Ultrafiltration Goal 1 Liters  Pre Treatment Labs Renal panel;CBC (procalcitonin, BMP, HIV antibody )  Dialysis Blood Pressure Support Ordered Normal Saline  Education / Care Plan  Dialysis Education Provided Yes  Documented Education in Care Plan Yes   Hemodialysis Catheter Left Internal jugular Double-lumen  Placement Date/Time: 11/08/16 1740   Placed prior to admission: No  Time Out: Correct patient;Correct site;Correct procedure  Maximum sterile barrier precautions: Hand hygiene;Cap;Mask;Sterile gown;Sterile gloves;Large sterile sheet  Site Prep: Chlorh...  Site Condition No complications  Blue Lumen Status Heparin locked  Red Lumen Status Heparin locked  Purple Lumen Status N/A  Dressing Type Gauze/Drain sponge  Dressing Status Dry;Clean  Interventions New dressing  Drainage Description None  Dressing Change Due 03/20/18

## 2018-03-13 NOTE — Progress Notes (Signed)
Patient admitted this morning for fever of unknown origin.  I will restart vancomycin and Zosyn.  ID consultation due to FUO and history of end-stage renal disease with catheter.  Continue to monitor for fevers and follow-up on blood cultures.  Procalcitonin level is elevated and therefore I restarted antibiotics.

## 2018-03-13 NOTE — Progress Notes (Signed)
Pharmacy Antibiotic Note  Jonathon Conley is a 52 y.o. male admitted on 03/12/2018 with FUO in ESRD hemodialysis patient..  Pharmacy has been consulted for Vancomycin and Zosyn dosing.  Plan: Patient received Vancomycin 1728m IV in the ED. Will continue with Vancomycin 1g IV after each dialysis session. Will order Zosyn 3.375g IV q12h EI     Temp (24hrs), Avg:98.2 F (36.8 C), Min:97.4 F (36.3 C), Max:99.4 F (37.4 C)  Recent Labs  Lab 03/12/18 2220 03/12/18 2247  WBC 5.8  --   CREATININE 10.87*  --   LATICACIDVEN  --  0.9    Estimated Creatinine Clearance: 8.2 mL/min (A) (by C-G formula based on SCr of 10.87 mg/dL (H)).    Allergies  Allergen Reactions  . Lisinopril Swelling    Facial swelling     Thank you for allowing pharmacy to be a part of this patient's care.  LPaulina Fusi PharmD, BCPS 03/13/2018 3:51 PM

## 2018-03-13 NOTE — Progress Notes (Signed)
[facility]  ROUNDING NOTE   Subjective:  Patient well-known to [place] from prior admissions. He comes in now with significant fever. Blood cultures have been taken and are thus far negative. Patient has been started on Zosyn and vancomycin. He does have a left internal jugular PermCath in place. In addition patient is due for dialysis today.  Objective:  Vital signs in last 24 hours:  Temp:  [97.4 F (36.3 C)-99.4 F (37.4 C)] 97.9 F (36.6 C) (05/29 1130) Pulse Rate:  [64-88] 72 (05/29 1130) Resp:  [16-40] 19 (05/29 1130) BP: (126-149)/(75-90) 138/75 (05/29 1130) SpO2:  [96 %-100 %] 99 % (05/29 1130)  Weight change:  There were no vitals filed for this visit.  Intake/Output: No intake/output data recorded.   Intake/Output this shift:  Total I/O In: 240 [P.O.:240] Out: 0   Physical Exam: General: No acute distress  Head: Normocephalic, atraumatic. Moist oral mucosal membranes  Eyes: Anicteric  Neck: Supple, trachea midline  Lungs:  Clear to auscultation, normal effort  Heart: S1S2 no rubs  Abdomen:  Soft, nontender, bowel sounds present  Extremities: Trace peripheral edema.  Neurologic: Awake, alert, following commands  Skin: No lesions  Access: Left IJ permcath, clotted RUE hero graft    Basic Metabolic Panel: Recent Labs  Lab 03/12/18 2220  NA 133*  K 3.6  CL 94*  CO2 26  GLUCOSE 106*  BUN 24*  CREATININE 10.87*  CALCIUM 8.8*    Liver Function Tests: Recent Labs  Lab 03/12/18 2220  AST 24  ALT 13*  ALKPHOS 52  BILITOT 1.4*  PROT 7.5  ALBUMIN 3.5   No results for input(s): LIPASE, AMYLASE in the last 168 hours. No results for input(s): AMMONIA in the last 168 hours.  CBC: Recent Labs  Lab 03/12/18 2220  WBC 5.8  HGB 9.5*  HCT 28.1*  MCV 88.9  PLT 94*    Cardiac Enzymes: Recent Labs  Lab 03/12/18 2220  TROPONINI 0.03*    BNP: Invalid input(s): POCBNP  CBG: No results for input(s): GLUCAP in the last 168  hours.  Microbiology: Results for orders placed or performed during the hospital encounter of 03/12/18  Blood culture (routine x 2)     Status: None (Preliminary result)   Collection Time: 03/12/18 11:06 PM  Result Value Ref Range Status   Specimen Description BLOOD LEFT FORARM  Final   Special Requests   Final    BOTTLES DRAWN AEROBIC AND ANAEROBIC Blood Culture results may not be optimal due to an inadequate volume of blood received in culture bottles   Culture   Final    NO GROWTH < 12 HOURS Performed at [facility], [address] [place], [place] [address]    Report Status PENDING  Incomplete  Blood culture (routine x 2)     Status: None (Preliminary result)   Collection Time: 03/12/18 11:55 PM  Result Value Ref Range Status   Specimen Description BLOOD Blood Culture adequate volume  Final   Special Requests   Final    BOTTLES DRAWN AEROBIC AND ANAEROBIC BLOOD RIGHT HAND   Culture   Final    NO GROWTH < 12 HOURS Performed at [facility], [address] [place], [place] [address]    Report Status PENDING  Incomplete  MRSA PCR Screening     Status: None   Collection Time: 03/13/18  1:32 AM  Result Value Ref Range Status   MRSA by PCR NEGATIVE NEGATIVE Final  Comment:        The GeneXpert MRSA Assay (FDA approved for NASAL specimens only), is one component of a comprehensive MRSA colonization surveillance program. It is not intended to diagnose MRSA infection nor to guide or monitor treatment for MRSA infections. Performed at [facility], [address] [place], [place] [address]     Coagulation Studies: No results for input(s): LABPROT, INR in the last 72 hours.  Urinalysis: No results for input(s): COLORURINE, LABSPEC, PHURINE, GLUCOSEU, HGBUR, BILIRUBINUR, KETONESUR, PROTEINUR, UROBILINOGEN, NITRITE, LEUKOCYTESUR in the last 72 hours.  Invalid input(s): APPERANCEUR    Imaging: Dg Chest 2 View  Result Date:  03/13/2018 CLINICAL DATA:  Fever, weakness, on dialysis EXAM: CHEST - 2 VIEW COMPARISON:  03/01/2018 FINDINGS: Right lower lobe scarring/volume loss. Small right pleural effusion. Left lung is clear. No pneumothorax. The heart is normal in size. Left chest dual lumen dialysis catheter terminating at the cavoatrial junction. Right IJ dialysis graft terminating in the upper right atrium. IMPRESSION: Small right pleural effusion with chronic changes in the right lower hemithorax. No evidence of acute cardiopulmonary disease. Electronically Signed   By: Julian Hy M.D.   On: 03/13/2018 00:00     Medications:   . piperacillin-tazobactam (ZOSYN)  IV 3.375 g (03/13/18 1043)  . vancomycin     . amLODipine  10 mg Oral Daily  . aspirin EC  81 mg Oral Daily  . [START ON 03/14/2018] calcitRIOL  0.5 mcg Oral Q T,Th,Sa-HD  . calcium acetate  667 mg Oral TID WC  . carvedilol  25 mg Oral BID  . cinacalcet  30 mg Oral Once per day on Mon Wed Fri  . feeding supplement (NEPRO CARB STEADY)  237 mL Oral BID BM  . ferrous sulfate  325 mg Oral TID WC  . heparin  5,000 Units Subcutaneous Q8H  . Mesalamine  800 mg Oral TID  . multivitamin  1 tablet Oral QHS  . simvastatin  5 mg Oral QHS  . spironolactone  25 mg Oral Daily   acetaminophen **OR** acetaminophen, bisacodyl, ondansetron **OR** ondansetron (ZOFRAN) IV, oxyCODONE-acetaminophen, senna-docusate  Assessment/ Plan:  52 y.o. male  with a PMHX of end-stage renal disease, initial diganosis 2. Got kidney transplant deceased donor in 2000/05/30 which lasted till October 2016. He has been on dialysis again since then.  Also has history of hypertension, anemia of chronic kidney disease, secondary hyperparathyroidism, hyperlipidemia, clotted right upper extremity hero graft.  [facility]/MWF  1.  ESRD on HD MWF.  Patient due for hemodialysis today.  Orders have been prepared.  2.  Fever.  Patient does have a PermCath in place.  He has been started  on vancomycin and Zosyn.  Blood cultures thus far negative.  Hold off on removing catheter for now as patient does not appear toxic at the moment.  3.  Anemia of chronic kidney disease.  Hemoglobin currently 9.5.  Hold off on Epogen for now.  4.  Secondary hyperparathyroidism.  Patient was previously on Sensipar but not tolerating this very well secondary to nausea.  Therefore we will go ahead and discontinue Sensipar at this time.  Continue calcium acetate 1 tablet p.o. 3 times daily with meals.  He is being considered for IV parasabiv as an outpt for treatment of SHPTH.      LOS: 0 Piera Downs 5/29/20191:35 PM

## 2018-03-13 NOTE — Progress Notes (Signed)
HD tx end    03/13/18 2102  Vital Signs  Pulse Rate 69  Pulse Rate Source Monitor  Resp 12  BP (!) 152/99  BP Location Left Arm  BP Method Automatic  Patient Position (if appropriate) Lying  Oxygen Therapy  SpO2 99 %  O2 Device Room Air  During Hemodialysis Assessment  Dialysis Fluid Bolus Normal Saline  Bolus Amount (mL) 250 mL  Intra-Hemodialysis Comments Tx completed

## 2018-03-13 NOTE — Progress Notes (Signed)
Initial Nutrition Assessment  DOCUMENTATION CODES:   Not applicable  INTERVENTION:   - Nepro Shake po BID, each supplement provides 425 kcal and 19 grams protein  - Rena-vit daily  NUTRITION DIAGNOSIS:   Inadequate oral intake related to poor appetite, nausea, vomiting as evidenced by per patient/family report.  GOAL:   Patient will meet greater than or equal to 90% of their needs  MONITOR:   PO intake, Supplement acceptance, I & O's, Labs, Weight trends  REASON FOR ASSESSMENT:   Malnutrition Screening Tool    ASSESSMENT:   52 year old male who presented to the ED with cough, nausea and vomiting, general malaise, and fever. PMH significant for ESRD on HD (MWF), hyperlipidemia, and hypertension.  Spoke with pt at bedside who reports having a poor appetite for the past 3 weeks due to feeling ill. Pt states that during this time, he ate 1-2 meals daily. A meal might include chicken, a hamburger, or a cold salad. Pt states that prior to this time, he had a "good" appetite and ate 3 meals daily. Pt states that he had some Cheerios for breakfast this morning and is feeling "queasy." Pt has had Nepro before and is agreeable to receiving BID between meals to aid in calorie and protein intake as appetite remains poor.  Pt states that he has been below his dry weight recently. Pt reports that his dry weight was recently increased from 82 kg to 83 kg and that he has weighed around 82.4 kg. Per weight history in chart, pt's weight has fluctuated between 82 kg and 83.9 kg since January 2019. RD suspects fluctuations are related to fluid shifts.  Medications reviewed and include: Phoslo capsule TID with meals, 325 mg ferrous sulfate TID with meals  Labs reviewed: sodium 133 (L), chloride 94 (L), BUN 24 (H), creatinine 10.87 (H), hemoglobin 9.5 (L), HCT 28.1 (L)  UOP: pt is anuric  NUTRITION - FOCUSED PHYSICAL EXAM:    Most Recent Value  Orbital Region  No depletion  Upper Arm Region   Mild depletion  Thoracic and Lumbar Region  Mild depletion  Buccal Region  No depletion  Temple Region  No depletion  Clavicle Bone Region  No depletion  Clavicle and Acromion Bone Region  Mild depletion  Scapular Bone Region  No depletion  Dorsal Hand  Mild depletion  Patellar Region  Moderate depletion  Anterior Thigh Region  Moderate depletion  Posterior Calf Region  Moderate depletion  Edema (RD Assessment)  None  Hair  Reviewed  Eyes  Reviewed  Mouth  Reviewed  Skin  Reviewed  Nails  Reviewed       Diet Order:   Diet Order           Diet renal with fluid restriction Fluid restriction: 1500 mL Fluid; Room service appropriate? Yes; Fluid consistency: Thin  Diet effective now          EDUCATION NEEDS:   No education needs have been identified at this time  Skin:  Skin Assessment: Reviewed RN Assessment  Last BM:  03/12/18  Height:   Ht Readings from Last 1 Encounters:  03/05/18 5' 9.75" (1.772 m)    Weight:   Wt Readings from Last 1 Encounters:  03/05/18 184 lb 3.2 oz (83.6 kg)    Ideal Body Weight:  74.8 kg(using height from 03/05/18)  BMI:  26.61 kg/m^2 (using height and weight from 03/05/18)  Estimated Nutritional Needs:   Kcal:  2100-2300 kcal/day (25-28 kcal/kg)  Protein:  100-115  grams/day (1.2-1.4 g/kg)  Fluid:  1500 mL fluid restriction    Gaynell Face, MS, RD, LDN Pager: [phone] Weekend/After Hours: [phone]

## 2018-03-13 NOTE — Consult Note (Signed)
[facility] Infectious Disease     Reason for Consult: FUO, ESRD   Referring Physician: Bettey Costa  Date of Admission:  03/12/2018   Active Problems:   FUO (fever of unknown origin)   HPI: Jonathon Conley is a 52 y.o. male admitted with fevers to 101 at home as well as a month of cough and malaise.  He reports that he has fits of coughing lasting up to 30 minutes. Non productive. No SOB. Wife ill with similar sxs.  He has some wt loss. Has not had fever at HD and no bcx done at HD per his report. He has not received any abx at home in last month either. He is on no immunosuppressive since 2017 after failed transplant.  Lives with his wife, has pet dogs. No travel.    Past Medical History:  Diagnosis Date  . Anemia   . Asthma    childhood  . Blood transfusion without reported diagnosis   . Chronic kidney disease    Dialysis Mon, Wed, Fri  . Colitis   . Diverticulosis 04/12/16   also seen: sigmoid and rectal erythema, path:   . Hemorrhoids 03/2016   bleeding at 04/12/16 colonoscopy, treated with  APC laser. cauterization  . Hyperlipidemia   . Hypertension   . OSA (obstructive sleep apnea) 03/27/2013   Past Surgical History:  Procedure Laterality Date  . APPLICATION OF WOUND VAC Right 12/13/2016   Procedure: APPLICATION OF WOUND VAC;  Surgeon: Algernon Huxley, MD;  Location: [facility] ORS;  Service: Vascular;  Laterality: Right;  . APPLICATION OF WOUND VAC Right 12/19/2016   Procedure: APPLICATION OF WOUND VAC;  Surgeon: Algernon Huxley, MD;  Location: [facility] OR[facility];  Service: Vascular;  Laterality: Right;  . COLONOSCOPY N/A 04/12/2016   Procedure: COLONOSCOPY;  Surgeon: Mauri Pole, MD;  Location: [facility];  Service: Endoscopy;  Laterality: N/A;  . DIALYSIS/PERMA CATHETER INSERTION N/A 02/26/2017   Procedure: Dialysis/Perma Catheter Insertion;  Surgeon: Algernon Huxley, MD;  Location: [place] [facility];  Service: Cardiovascular;  Laterality: N/A;  . EXCHANGE OF A DIALYSIS CATHETER  11/20/2016    Procedure: Exchange Of A Dialysis Catheter;  Surgeon: Algernon Huxley, MD;  Location: [place] CV LAB;  Service: Cardiovascular;;  . fistulagrams Right   . HEMATOMA EVACUATION Right 12/13/2016   Procedure: EVACUATION HEMATOMA;  Surgeon: Algernon Huxley, MD;  Location: [facility];  Service: Vascular;  Laterality: Right;  . HEMATOMA EVACUATION Right 12/19/2016   Procedure: EVACUATIONof seroma;  Surgeon: Algernon Huxley, MD;  Location: [facility];  Service: Vascular;  Laterality: Right;  . HOT HEMOSTASIS N/A 04/12/2016   Procedure: HOT HEMOSTASIS (ARGON PLASMA COAGULATION/BICAP);  Surgeon: Mauri Pole, MD;  Location: [facility] ENDOSCOPY;  Service: Endoscopy;  Laterality: N/A;  . KIDNEY TRANSPLANT    . PERIPHERAL VASCULAR CATHETERIZATION Bilateral 10/02/2016   Procedure: Upper Extremity Venography;  Surgeon: Algernon Huxley, MD;  Location: [facility] CV LAB;  Service: Cardiovascular;  Laterality: Bilateral;  . PERIPHERAL VASCULAR CATHETERIZATION Right 11/08/2016   Procedure: DIALYSIS/PERMA CATHETER REMOVAL right Jugular;  Surgeon: Algernon Huxley, MD;  Location: [facility];  Service: Vascular;  Laterality: Right;  . PERIPHERAL VASCULAR CATHETERIZATION Left 11/08/2016   Procedure: DIALYSIS/PERMA CATHETER INSERTION left jugular with u/s guide and flouroscan;  Surgeon: Algernon Huxley, MD;  Location: [facility];  Service: Vascular;  Laterality: Left;  . PERIPHERAL VASCULAR CATHETERIZATION N/A 11/08/2016   Procedure: IVC FILTER INSERTION;  Surgeon: Algernon Huxley, MD;  Location: [facility];  Service: Vascular;  Laterality: N/A;  . perm a cath in Rt upper chest Right   . THORACENTESIS    . VASCULAR ACCESS DEVICE INSERTION Right 11/08/2016   Procedure: INSERTION OF HERO VASCULAR ACCESS DEVICE ( GRAFT );  Surgeon: Algernon Huxley, MD;  Location: [facility];  Service: Vascular;  Laterality: Right;   Social History   Tobacco Use  . Smoking status: Never Smoker  . Smokeless tobacco: Never Used  Substance Use Topics  . Alcohol use: No  .  Drug use: No   Family History  Problem Relation Age of Onset  . Heart disease Mother   . Cancer Mother        ovarian  . Cancer Maternal Grandmother   . Cancer Maternal Grandfather   . Asthma Son   . Asthma Son     Allergies:  Allergies  Allergen Reactions  . Lisinopril Swelling    Facial swelling    Current antibiotics: Antibiotics Given (last 72 hours)    Date/Time Action Medication Dose Rate   03/13/18 0012 New Bag/Given   piperacillin-tazobactam (ZOSYN) IVPB 3.375 g 3.375 g 100 mL/hr   03/13/18 0215 New Bag/Given  [no Iv Acess]   vancomycin (VANCOCIN) 1,750 mg in sodium chloride 0.9 % 500 mL IVPB 1,750 mg 250 mL/hr   03/13/18 1043 New Bag/Given   piperacillin-tazobactam (ZOSYN) IVPB 3.375 g 3.375 g 12.5 mL/hr      MEDICATIONS: . amLODipine  10 mg Oral Daily  . aspirin EC  81 mg Oral Daily  . [START ON 03/14/2018] calcitRIOL  0.5 mcg Oral Q T,Th,Sa-HD  . calcium acetate  667 mg Oral TID WC  . carvedilol  25 mg Oral BID  . feeding supplement (NEPRO CARB STEADY)  237 mL Oral BID BM  . ferrous sulfate  325 mg Oral TID WC  . heparin  5,000 Units Subcutaneous Q8H  . Mesalamine  800 mg Oral TID  . multivitamin  1 tablet Oral QHS  . simvastatin  5 mg Oral QHS  . spironolactone  25 mg Oral Daily    Review of Systems - 11 systems reviewed and negative per HPI   OBJECTIVE: Temp:  [97.4 F (36.3 C)-99.4 F (37.4 C)] 97.9 F (36.6 C) (05/29 1130) Pulse Rate:  [64-88] 72 (05/29 1130) Resp:  [16-40] 19 (05/29 1130) BP: (126-149)/(75-90) 138/75 (05/29 1130) SpO2:  [96 %-100 %] 99 % (05/29 1130) Physical Exam  Constitutional: He is oriented to person, place, and time. Chronically ill appearing HENT: anicteric, muddy sclera. Mouth/Throat: Oropharynx is clear and moist. No oropharyngeal exudate.  Cardiovascular: Normal rate, regular rhythm and normal heart sounds.  HD cath L chest wall wnl Pulmonary/Chest: Effort normal and breath sounds normal. No respiratory  distress. He has no wheezes.  Abdominal: Soft. Bowel sounds are normal. He exhibits no distension. There is no tenderness.  Lymphadenopathy: He has no cervical adenopathy.  Neurological: He is alert and oriented to person, place, and time.  Skin: Skin is warm and dry. No rash noted. No erythema.  Psychiatric: He has a normal mood and affect. His behavior is normal.     LABS: Results for orders placed or performed during the hospital encounter of 03/12/18 (from the past 48 hour(s))  CBC     Status: Abnormal   Collection Time: 03/12/18 10:20 PM  Result Value Ref Range   WBC 5.8 3.8 - 10.6 K/uL   RBC 3.16 (L) 4.40 - 5.90 MIL/uL   Hemoglobin 9.5 (  L) 13.0 - 18.0 g/dL   HCT 28.1 (L) 40.0 - 52.0 %   MCV 88.9 80.0 - 100.0 fL   MCH 30.2 26.0 - 34.0 pg   MCHC 34.0 32.0 - 36.0 g/dL   RDW 17.1 (H) 11.5 - 14.5 %   Platelets 94 (L) 150 - 440 K/uL    Comment: Performed at [facility], [address] [place], [place] [address]  Comprehensive metabolic panel     Status: Abnormal   Collection Time: 03/12/18 10:20 PM  Result Value Ref Range   Sodium 133 (L) 135 - 145 mmol/L   Potassium 3.6 3.5 - 5.1 mmol/L   Chloride 94 (L) 101 - 111 mmol/L   CO2 26 22 - 32 mmol/L   Glucose, Bld 106 (H) 65 - 99 mg/dL   BUN 24 (H) 6 - 20 mg/dL   Creatinine, Ser 10.87 (H) 0.61 - 1.24 mg/dL   Calcium 8.8 (L) 8.9 - 10.3 mg/dL   Total Protein 7.5 6.5 - 8.1 g/dL   Albumin 3.5 3.5 - 5.0 g/dL   AST 24 15 - 41 U/L   ALT 13 (L) 17 - 63 U/L   Alkaline Phosphatase 52 38 - 126 U/L   Total Bilirubin 1.4 (H) 0.3 - 1.2 mg/dL   GFR calc non Af Amer 5 (L) >60 mL/min   GFR calc Af Amer 6 (L) >60 mL/min    Comment: (NOTE) The eGFR has been calculated using the CKD EPI equation. This calculation has not been validated in all clinical situations. eGFR's persistently <60 mL/min signify possible Chronic Kidney Disease.    Anion gap 13 5 - 15    Comment: Performed at [facility], [address]  [place], [place] [address]  Troponin I     Status: Abnormal   Collection Time: 03/12/18 10:20 PM  Result Value Ref Range   Troponin I 0.03 (HH) <0.03 ng/mL    Comment: CRITICAL RESULT CALLED TO, READ BACK BY AND VERIFIED WITH REBECCA UHORCHUK ON 03/12/18 AT 2328 JAG Performed at [facility], [address] [place], [place] [address]   Lactic acid, plasma     Status: None   Collection Time: 03/12/18 10:47 PM  Result Value Ref Range   Lactic Acid, Venous 0.9 0.5 - 1.9 mmol/L    Comment: Performed at [facility], [address] [place], [place] [address]  Blood culture (routine x 2)     Status: None (Preliminary result)   Collection Time: 03/12/18 11:06 PM  Result Value Ref Range   Specimen Description BLOOD LEFT FORARM    Special Requests      BOTTLES DRAWN AEROBIC AND ANAEROBIC Blood Culture results may not be optimal due to an inadequate volume of blood received in culture bottles   Culture      NO GROWTH < 12 HOURS Performed at [facility], [address] [place], [place] [address]    Report Status PENDING   Blood culture (routine x 2)     Status: None (Preliminary result)   Collection Time: 03/12/18 11:55 PM  Result Value Ref Range   Specimen Description BLOOD Blood Culture adequate volume    Special Requests      BOTTLES DRAWN AEROBIC AND ANAEROBIC BLOOD RIGHT HAND   Culture      NO GROWTH < 12 HOURS Performed at [facility], [address] [place], [place] [address]    Report Status PENDING   MRSA PCR Screening  Status: None   Collection Time: 03/13/18  1:32 AM  Result Value Ref Range   MRSA by PCR NEGATIVE NEGATIVE    Comment:        The GeneXpert MRSA Assay (FDA approved for NASAL specimens only), is one component of a comprehensive MRSA colonization surveillance program. It is not intended to diagnose MRSA infection nor to guide or monitor treatment for MRSA infections. Performed at [facility], [address] [place], [place] [address]   Brain natriuretic peptide     Status: Abnormal   Collection Time: 03/13/18  2:06 AM  Result Value Ref Range   B Natriuretic Peptide 1,392.0 (H) 0.0 - 100.0 pg/mL    Comment: Performed at [facility], [place],  [address]  Procalcitonin - Baseline     Status: None   Collection Time: 03/13/18  2:06 AM  Result Value Ref Range   Procalcitonin 6.86 ng/mL    Comment:        Interpretation: PCT > 2 ng/mL: Systemic infection (sepsis) is likely, unless other causes are known. (NOTE)       Sepsis PCT Algorithm           Lower Respiratory Tract                                      Infection PCT Algorithm    ----------------------------     ----------------------------         PCT < 0.25 ng/mL                PCT < 0.10 ng/mL         Strongly encourage             Strongly discourage   discontinuation of antibiotics    initiation of antibiotics    ----------------------------     -----------------------------       PCT 0.25 - 0.50 ng/mL            PCT 0.10 - 0.25 ng/mL               OR       >80% decrease in PCT            Discourage initiation of                                            antibiotics      Encourage discontinuation           of antibiotics    ----------------------------     -----------------------------         PCT >= 0.50 ng/mL              PCT 0.26 - 0.50 ng/mL               AND       <80% decrease in PCT              Encourage initiation of                                             antibiotics       Encourage continuation  of antibiotics    ----------------------------     -----------------------------        PCT >= 0.50 ng/mL                  PCT > 0.50 ng/mL               AND         increase in PCT                  Strongly encourage                                      initiation of antibiotics    Strongly encourage escalation           of antibiotics                                      -----------------------------                                           PCT <= 0.25 ng/mL                                                 OR                                        > 80% decrease in PCT                                     Discontinue / Do not initiate                                             antibiotics Performed at [facility], [address] [place], [place] [address]    No components found for: ESR, C REACTIVE PROTEIN MICRO: Recent Results (from the past 720 hour(s))  Blood culture (routine x 2)     Status: None (Preliminary result)   Collection Time: 03/12/18 11:06 PM  Result Value Ref Range Status   Specimen Description BLOOD LEFT FORARM  Final   Special Requests   Final    BOTTLES DRAWN AEROBIC AND ANAEROBIC Blood Culture results may not be optimal due to an inadequate volume of blood received in culture bottles   Culture   Final    NO GROWTH < 12 HOURS Performed at [facility], [address], [place], [place] [address]    Report Status PENDING  Incomplete  Blood culture (routine x 2)     Status: None (Preliminary result)   Collection Time: 03/12/18 11:55 PM  Result Value Ref Range Status   Specimen Description BLOOD Blood Culture adequate volume  Final   Special Requests   Final    BOTTLES DRAWN AEROBIC AND ANAEROBIC BLOOD RIGHT HAND  Culture   Final    NO GROWTH < 12 HOURS Performed at [facility], [address] [place], [place] [address]    Report Status PENDING  Incomplete  MRSA PCR Screening     Status: None   Collection Time: 03/13/18  1:32 AM  Result Value Ref Range Status   MRSA by PCR NEGATIVE NEGATIVE Final    Comment:        The GeneXpert MRSA Assay (FDA approved for NASAL specimens only), is one component of a comprehensive MRSA colonization surveillance program. It is not intended to diagnose MRSA infection nor to guide or monitor treatment for MRSA infections. Performed at [facility], [address] [place], [place] [address]     IMAGING: Dg Chest 2 View  Result Date: 03/13/2018 CLINICAL DATA:  Fever, weakness, on dialysis EXAM: CHEST - 2 VIEW COMPARISON:  03/01/2018 FINDINGS: Right lower lobe scarring/volume loss. Small right pleural effusion. Left lung is clear. No pneumothorax. The heart is normal in size. Left chest dual lumen dialysis catheter terminating at the cavoatrial junction. Right IJ dialysis graft terminating in the upper right atrium. IMPRESSION: Small right pleural effusion with chronic changes in the right lower hemithorax. No evidence of acute cardiopulmonary disease. Electronically Signed   By: Julian Hy M.D.   On: 03/13/2018 00:00   Dg Chest 2 View  Result Date: 03/01/2018 CLINICAL DATA:  Cough and chills EXAM: CHEST - 2 VIEW COMPARISON:  01/28/2018 FINDINGS: Cardiac shadow is stable. Left dialysis catheter and right hero graft are seen and stable. Chronic blunting of the right costophrenic angle is noted. Stable rounded density is noted in the right base dating back to CT examination from 2017. No new infiltrate or sizable effusion is seen. No acute bony abnormality is noted. IMPRESSION: Chronic changes in the right lung base. No acute abnormality noted. Electronically Signed   By: Inez Catalina M.D.   On: 03/01/2018 11:32    Assessment:   Jonathon Conley is a 52 y.o. male with ESRD on HD through tunneled HD cath admitted with fevers and one week of NV and cough as well as malaise and fatigue.   He reported fever to 101 at home. No fevers here on admit or since admit. WBC nml. CXR negative. LFTs nml, BNP elevated. Unclear etiology. I think he would be sicker if bacteremic from his line. Cxs are pending. Would work up his pulmonary sxs   Recommendations Check resp PCR Check CT chest  Thank you very much for allowing me to participate in the care of this patient. Please call with questions.   Cheral Marker. Ola Spurr, MD

## 2018-03-13 NOTE — Care Management (Signed)
Observation for fever. notified  Elvera Bicker with [employer] of observation.  [facility] MWF

## 2018-03-13 NOTE — Care Management Obs Status (Signed)
MEDICARE OBSERVATION STATUS NOTIFICATION   Patient Details  Name: Jonathon Conley MRN: [id number] Date of Birth: 1966/05/08   Medicare Observation Status Notification Given:  Yes    Katrina Stack, RN 03/13/2018, 2:12 PM

## 2018-03-13 NOTE — ED Notes (Signed)
Date and time results received: 03/13/18 2328  Test: troponin  Critical Value: 0.03  Name of Provider Notified: Dr. Mariea Clonts

## 2018-03-13 NOTE — Progress Notes (Signed)
Pre HD assessment    03/13/18 1725  Neurological  Level of Consciousness Alert  Orientation Level Oriented X4  Respiratory  Respiratory Pattern Regular;Unlabored  Chest Assessment Chest expansion symmetrical  Cardiac  ECG Monitor Yes  Vascular  R Radial Pulse +2  L Radial Pulse +2  Integumentary  Integumentary (WDL) X  Skin Color Appropriate for ethnicity  Musculoskeletal  Musculoskeletal (WDL) X  Generalized Weakness Yes  Assistive Device None  GU Assessment  Genitourinary (WDL) X  Genitourinary Symptoms  (HD)  Psychosocial  Psychosocial (WDL) WDL

## 2018-03-13 NOTE — Progress Notes (Signed)
HD tx start    03/13/18 1723  Vital Signs  Pulse Rate 66  Pulse Rate Source Monitor  Resp 14  BP (!) 147/86  BP Location Left Arm  BP Method Automatic  Patient Position (if appropriate) Lying  Oxygen Therapy  SpO2 99 %  O2 Device Room Air  During Hemodialysis Assessment  Blood Flow Rate (mL/min) 400 mL/min  Arterial Pressure (mmHg) -220 mmHg  Venous Pressure (mmHg) 130 mmHg  Transmembrane Pressure (mmHg) 60 mmHg  Ultrafiltration Rate (mL/min) 430 mL/min  Dialysate Flow Rate (mL/min) 800 ml/min  Conductivity: Machine  13.9  HD Safety Checks Performed Yes  Dialysis Fluid Bolus Normal Saline  Bolus Amount (mL) 250 mL  Intra-Hemodialysis Comments Tx initiated  Hemodialysis Catheter Left Internal jugular Double-lumen  Placement Date/Time: 11/08/16 1740   Placed prior to admission: No  Time Out: Correct patient;Correct site;Correct procedure  Maximum sterile barrier precautions: Hand hygiene;Cap;Mask;Sterile gown;Sterile gloves;Large sterile sheet  Site Prep: Chlorh...  Blue Lumen Status Infusing  Red Lumen Status Infusing

## 2018-03-13 NOTE — Progress Notes (Signed)
Post HD assessment. Pt tolerated tx well without c/o or complications. Net UF 1012, goal met.    03/13/18 2112  Vital Signs  Temp 98.8 F (37.1 C)  Temp Source Oral  Pulse Rate 70  Pulse Rate Source Monitor  Resp 17  BP (!) 160/83  BP Location Left Arm  BP Method Automatic  Patient Position (if appropriate) Lying  Oxygen Therapy  SpO2 97 %  O2 Device Room Air  Dialysis Weight  Weight 86.8 kg (191 lb 5.8 oz)  Type of Weight Post-Dialysis  Post-Hemodialysis Assessment  Rinseback Volume (mL) 250 mL  KECN 76.7 V  Dialyzer Clearance Lightly streaked  Duration of HD Treatment -hour(s) 3.5 hour(s)  Hemodialysis Intake (mL) 500 mL  UF Total -Machine (mL) 1512 mL  Net UF (mL) 1012 mL  Tolerated HD Treatment Yes  Education / Care Plan  Dialysis Education Provided Yes  Documented Education in Care Plan Yes  Hemodialysis Catheter Left Internal jugular Double-lumen  Placement Date/Time: 11/08/16 1740   Placed prior to admission: No  Time Out: Correct patient;Correct site;Correct procedure  Maximum sterile barrier precautions: Hand hygiene;Cap;Mask;Sterile gown;Sterile gloves;Large sterile sheet  Site Prep: Chlorh...  Site Condition No complications  Blue Lumen Status Heparin locked  Red Lumen Status Heparin locked  Purple Lumen Status N/A  Catheter fill solution Heparin 1000 units/ml  Catheter fill volume (Arterial) 2.1 cc  Catheter fill volume (Venous) 2.1  Dressing Type Biopatch  Dressing Status Clean;Dry;Intact  Interventions New dressing  Drainage Description None  Dressing Change Due 03/20/18  Post treatment catheter status Capped and Clamped

## 2018-03-14 DIAGNOSIS — I10 Essential (primary) hypertension: Secondary | ICD-10-CM | POA: Diagnosis not present

## 2018-03-14 DIAGNOSIS — N186 End stage renal disease: Secondary | ICD-10-CM | POA: Diagnosis not present

## 2018-03-14 DIAGNOSIS — R509 Fever, unspecified: Secondary | ICD-10-CM | POA: Diagnosis not present

## 2018-03-14 LAB — RESPIRATORY PANEL BY PCR
Adenovirus: NOT DETECTED
BORDETELLA PERTUSSIS-RVPCR: NOT DETECTED
Chlamydophila pneumoniae: NOT DETECTED
Coronavirus 229E: NOT DETECTED
Coronavirus HKU1: NOT DETECTED
Coronavirus NL63: NOT DETECTED
Coronavirus OC43: NOT DETECTED
INFLUENZA B-RVPPCR: NOT DETECTED
Influenza A: NOT DETECTED
METAPNEUMOVIRUS-RVPPCR: NOT DETECTED
Mycoplasma pneumoniae: NOT DETECTED
PARAINFLUENZA VIRUS 2-RVPPCR: NOT DETECTED
PARAINFLUENZA VIRUS 3-RVPPCR: NOT DETECTED
Parainfluenza Virus 1: NOT DETECTED
Parainfluenza Virus 4: NOT DETECTED
RHINOVIRUS / ENTEROVIRUS - RVPPCR: NOT DETECTED
Respiratory Syncytial Virus: NOT DETECTED

## 2018-03-14 LAB — BASIC METABOLIC PANEL
Anion gap: 10 (ref 5–15)
BUN: 15 mg/dL (ref 6–20)
CALCIUM: 8.3 mg/dL — AB (ref 8.9–10.3)
CO2: 27 mmol/L (ref 22–32)
CREATININE: 7.51 mg/dL — AB (ref 0.61–1.24)
Chloride: 100 mmol/L — ABNORMAL LOW (ref 101–111)
GFR calc non Af Amer: 7 mL/min — ABNORMAL LOW (ref >60)
GFR, EST AFRICAN AMERICAN: 9 mL/min — AB (ref >60)
Glucose, Bld: 94 mg/dL (ref 65–99)
Potassium: 3.7 mmol/L (ref 3.5–5.1)
SODIUM: 137 mmol/L (ref 135–145)

## 2018-03-14 LAB — CBC
HEMATOCRIT: 27.1 % — AB (ref 40.0–52.0)
Hemoglobin: 9 g/dL — ABNORMAL LOW (ref 13.0–18.0)
MCH: 29 pg (ref 26.0–34.0)
MCHC: 33.2 g/dL (ref 32.0–36.0)
MCV: 87.2 fL (ref 80.0–100.0)
Platelets: 80 10*3/uL — ABNORMAL LOW (ref 150–440)
RBC: 3.1 MIL/uL — ABNORMAL LOW (ref 4.40–5.90)
RDW: 16.9 % — AB (ref 11.5–14.5)
WBC: 3 10*3/uL — ABNORMAL LOW (ref 3.8–10.6)

## 2018-03-14 LAB — HIV ANTIBODY (ROUTINE TESTING W REFLEX): HIV Screen 4th Generation wRfx: NONREACTIVE

## 2018-03-14 LAB — PROCALCITONIN: PROCALCITONIN: 9.33 ng/mL

## 2018-03-14 MED ORDER — LEVOFLOXACIN 500 MG PO TABS
500.0000 mg | ORAL_TABLET | ORAL | 0 refills | Status: AC
Start: 2018-03-16 — End: 2018-03-23

## 2018-03-14 MED ORDER — LEVOFLOXACIN IN D5W 750 MG/150ML IV SOLN
750.0000 mg | Freq: Once | INTRAVENOUS | Status: AC
  Administered 2018-03-14: 750 mg via INTRAVENOUS
  Filled 2018-03-14: qty 150

## 2018-03-14 MED ORDER — LEVOFLOXACIN IN D5W 500 MG/100ML IV SOLN: 500.0000 mg | INTRAVENOUS | Status: DC

## 2018-03-14 NOTE — Discharge Summary (Signed)
[facility] [place] NAME: Jonathon Conley    MR#:  [id number]  DATE OF BIRTH:  Oct 09, 1966  DATE OF ADMISSION:  03/12/2018 ADMITTING PHYSICIAN: Arta Silence, MD  DATE OF DISCHARGE: 03/14/2018  PRIMARY CARE PHYSICIAN: Tresa Garter, MD    ADMISSION DIAGNOSIS:  Cough [R05] Elevated troponin [R74.8] Fever, unspecified fever cause [R50.9] Nausea and vomiting, intractability of vomiting not specified, unspecified vomiting type [R11.2]  DISCHARGE DIAGNOSIS:  Active Problems:   FUO (fever of unknown origin)   SECONDARY DIAGNOSIS:   Past Medical History:  Diagnosis Date  . Anemia   . Asthma    childhood  . Blood transfusion without reported diagnosis   . Chronic kidney disease    Dialysis Mon, Wed, Fri  . Colitis   . Diverticulosis 04/12/16   also seen: sigmoid and rectal erythema, path:   . Hemorrhoids 03/2016   bleeding at 04/12/16 colonoscopy, treated with  APC laser. cauterization  . Hyperlipidemia   . Hypertension   . OSA (obstructive sleep apnea) 03/27/2013    HOSPITAL COURSE:   52 year old male with end-stage renal disease on hemodialysis who presented with fevers.  1.  Fevers due to community acquired pneumonia: Patient presented with fever of 101 at home.  He had no fevers while in the hospital.  He was placed on broad-spectrum antibiotics including vancomycin and Zosyn.  ID was consulted. His blood cultures have remained negative.  CT chest does show left upper lobe pneumonia which is likely etiology of the patient's fever. He will be discharged on oral Levaquin which is renally dose.  2.  End-stage renal disease on hemodialysis: Patient will continue routine dialysis schedule.  3.  Essential hypertension: Continue Coreg, Aldactone, Norvasc  4.  Hyperlipidemia: Continue statin  DISCHARGE CONDITIONS AND DIET:   Stable for discharge on renal diet  CONSULTS OBTAINED:  Treatment Team:  Arta Silence,  MD Anthonette Legato, MD Leonel Ramsay, MD  DRUG ALLERGIES:   Allergies  Allergen Reactions  . Lisinopril Swelling    Facial swelling    DISCHARGE MEDICATIONS:   Allergies as of 03/14/2018      Reactions   Lisinopril Swelling   Facial swelling      Medication List    STOP taking these medications   oxyCODONE-acetaminophen 7.5-325 MG tablet Commonly known as:  PERCOCET     TAKE these medications   amLODipine 10 MG tablet Commonly known as:  NORVASC Take 10 mg by mouth daily.   aspirin EC 81 MG tablet Take 81 mg by mouth daily.   calcitRIOL 0.5 MCG capsule Commonly known as:  ROCALTROL Take 1 capsule (0.5 mcg total) by mouth Every Tuesday,Thursday,and Saturday with dialysis.   calcium acetate 667 MG capsule Commonly known as:  PHOSLO Take 667 mg by mouth 3 (three) times daily with meals.   carvedilol 25 MG tablet Commonly known as:  COREG Take 25 mg by mouth 2 (two) times daily.   cinacalcet 30 MG tablet Commonly known as:  SENSIPAR Take 30 mg by mouth 3 (three) times a week. Tue, Thurs, Sat after dialysis   ferrous sulfate 325 (65 FE) MG tablet Take 325 mg by mouth 3 (three) times daily with meals. Reported on 04/09/2016   levofloxacin 500 MG tablet Commonly known as:  LEVAQUIN Take 1 tablet (500 mg total) by mouth every other day for 7 days. Start 6/1 Start taking on:  03/16/2018   Mesalamine 800 MG Tbec Take 1 tablet (800  mg total) by mouth 3 (three) times daily.   simvastatin 5 MG tablet Commonly known as:  ZOCOR Take 1 tablet (5 mg total) by mouth at bedtime.   spironolactone 25 MG tablet Commonly known as:  ALDACTONE Take 25 mg by mouth daily.         Today   CHIEF COMPLAINT:  Patient is doing well this morning.  No acute events overnight.  No fevers overnight.   VITAL SIGNS:  Blood pressure (!) 152/96, pulse 62, temperature 97.6 F (36.4 C), temperature source Oral, resp. rate 20, weight 86.8 kg (191 lb 5.8 oz), SpO2 100  %.   REVIEW OF SYSTEMS:  Review of Systems  Constitutional: Negative.  Negative for chills, fever and malaise/fatigue.  HENT: Negative.  Negative for ear discharge, ear pain, hearing loss, nosebleeds and sore throat.   Eyes: Negative.  Negative for blurred vision and pain.  Respiratory: Positive for cough. Negative for hemoptysis, shortness of breath and wheezing.   Cardiovascular: Negative.  Negative for chest pain, palpitations and leg swelling.  Gastrointestinal: Negative.  Negative for abdominal pain, blood in stool, diarrhea, nausea and vomiting.  Genitourinary: Negative.  Negative for dysuria.  Musculoskeletal: Negative.  Negative for back pain.  Skin: Negative.   Neurological: Negative for dizziness, tremors, speech change, focal weakness, seizures and headaches.  Endo/Heme/Allergies: Negative.  Does not bruise/bleed easily.  Psychiatric/Behavioral: Negative.  Negative for depression, hallucinations and suicidal ideas.     PHYSICAL EXAMINATION:  GENERAL:  52 y.o.-year-old patient lying in the bed with no acute distress.  NECK:  Supple, no jugular venous distention. No thyroid enlargement, no tenderness.  LUNGS: Normal breath sounds bilaterally, no wheezing, rales,rhonchi  No use of accessory muscles of respiration.  CARDIOVASCULAR: S1, S2 normal. No murmurs, rubs, or gallops.  ABDOMEN: Soft, non-tender, non-distended. Bowel sounds present. No organomegaly or mass.  EXTREMITIES: No pedal edema, cyanosis, or clubbing.  PSYCHIATRIC: The patient is alert and oriented x 3.  SKIN: No obvious rash, lesion, or ulcer.   DATA REVIEW:   CBC Recent Labs  Lab 03/14/18 0546  WBC 3.0*  HGB 9.0*  HCT 27.1*  PLT 80*    Chemistries  Recent Labs  Lab 03/12/18 2220  03/14/18 0546  NA 133*   < > 137  K 3.6   < > 3.7  CL 94*   < > 100*  CO2 26   < > 27  GLUCOSE 106*   < > 94  BUN 24*   < > 15  CREATININE 10.87*   < > 7.51*  CALCIUM 8.8*   < > 8.3*  AST 24  --   --   ALT 13*   --   --   ALKPHOS 52  --   --   BILITOT 1.4*  --   --    < > = values in this interval not displayed.    Cardiac Enzymes Recent Labs  Lab 03/12/18 2220  TROPONINI 0.03*    Microbiology Results  @[email or web address] @  RADIOLOGY:  Dg Chest 2 View  Result Date: 03/13/2018 CLINICAL DATA:  Fever, weakness, on dialysis EXAM: CHEST - 2 VIEW COMPARISON:  03/01/2018 FINDINGS: Right lower lobe scarring/volume loss. Small right pleural effusion. Left lung is clear. No pneumothorax. The heart is normal in size. Left chest dual lumen dialysis catheter terminating at the cavoatrial junction. Right IJ dialysis graft terminating in the upper right atrium. IMPRESSION: Small right pleural effusion with chronic changes in the right lower hemithorax. No evidence  of acute cardiopulmonary disease. Electronically Signed   By: Julian Hy M.D.   On: 03/13/2018 00:00   Ct Chest Wo Contrast  Result Date: 03/13/2018 CLINICAL DATA:  Fever with cough, nausea, vomiting and malaise for 1 week. End-stage renal disease on hemodialysis. EXAM: CT CHEST WITHOUT CONTRAST TECHNIQUE: Multidetector CT imaging of the chest was performed following the standard protocol without IV contrast. COMPARISON:  Radiographs 03/12/2018 and 03/01/2018. Abdominal CT 04/09/2016. FINDINGS: Cardiovascular: Bilateral IJ hemodialysis catheters are unchanged from the recent chest radiographs. No acute vascular findings on noncontrast imaging. There is coronary artery and mild aortic atherosclerosis. The heart size is normal. There is no pericardial effusion. Mediastinum/Nodes: There are no enlarged mediastinal or hilar lymph nodes. There are mildly prominent axillary lymph nodes bilaterally, measuring up to 13 mm on the right (image 55/2). The thyroid gland, trachea and esophagus demonstrate no significant findings. Lungs/Pleura: Chronic pleural thickening on the right and blunting of the right costophrenic angle are similar to recent prior chest  radiographs. There is no significant residual pleural effusion. Subpleural opacities and volume loss in the right middle and lower lobes are adjacent to the pleural disease and most likely reflect chronic rounded atelectasis. Minimal patchy left upper lobe ground-glass opacities noted on images 46 and 47/3, likely inflammatory. There is no consolidation. 5 mm perifissural nodule along the left major fissure on image 79/3 is likely a lymph node. Upper abdomen: Marked chronic renal cortical thinning and small cystic renal lesions bilaterally are similar to the prior study. No acute abdominal findings. Musculoskeletal/Chest wall: There is no chest wall mass or suspicious osseous finding. Moderate bilateral gynecomastia. IMPRESSION: 1. Right pleural effusion demonstrated on 2017 abdominal CT has resolved. There is underlying pleural thickening, blunting of the costophrenic angle and subpleural densities in the right middle and lower lobes, likely representing chronic atelectasis as correlated with interval chest radiographs. 2. Patchy left upper lobe ground-glass opacity may be inflammatory. No consolidation. 3. Mildly prominent axillary lymph nodes bilaterally, likely reactive. 4. Sequela of chronic renal failure. 5. Coronary artery atherosclerosis. Electronically Signed   By: Richardean Sale M.D.   On: 03/13/2018 17:04      Allergies as of 03/14/2018      Reactions   Lisinopril Swelling   Facial swelling      Medication List    STOP taking these medications   oxyCODONE-acetaminophen 7.5-325 MG tablet Commonly known as:  PERCOCET     TAKE these medications   amLODipine 10 MG tablet Commonly known as:  NORVASC Take 10 mg by mouth daily.   aspirin EC 81 MG tablet Take 81 mg by mouth daily.   calcitRIOL 0.5 MCG capsule Commonly known as:  ROCALTROL Take 1 capsule (0.5 mcg total) by mouth Every Tuesday,Thursday,and Saturday with dialysis.   calcium acetate 667 MG capsule Commonly known as:   PHOSLO Take 667 mg by mouth 3 (three) times daily with meals.   carvedilol 25 MG tablet Commonly known as:  COREG Take 25 mg by mouth 2 (two) times daily.   cinacalcet 30 MG tablet Commonly known as:  SENSIPAR Take 30 mg by mouth 3 (three) times a week. Tue, Thurs, Sat after dialysis   ferrous sulfate 325 (65 FE) MG tablet Take 325 mg by mouth 3 (three) times daily with meals. Reported on 04/09/2016   levofloxacin 500 MG tablet Commonly known as:  LEVAQUIN Take 1 tablet (500 mg total) by mouth every other day for 7 days. Start 6/1 Start taking on:  03/16/2018   Mesalamine 800 MG Tbec Take 1 tablet (800 mg total) by mouth 3 (three) times daily.   simvastatin 5 MG tablet Commonly known as:  ZOCOR Take 1 tablet (5 mg total) by mouth at bedtime.   spironolactone 25 MG tablet Commonly known as:  ALDACTONE Take 25 mg by mouth daily.          Management plans discussed with the patient and he is in agreement. Stable for discharge home  Patient should follow up with pcp  CODE STATUS:     Code Status Orders  (From admission, onward)        Start     Ordered   03/13/18 0124  Full code  Continuous     03/13/18 0123    Code Status History    Date Active Date Inactive Code Status Order ID Comments User Context   12/18/2016 0150 12/19/2016 2105 Full Code [id number]  Harvie Bridge, DO Inpatient   10/02/2016 1011 10/02/2016 1418 Full Code [id number]  Algernon Huxley, MD Inpatient   04/08/2016 1412 04/12/2016 1955 Full Code [id number]  Norman Herrlich, MD ED   12/14/2015 2223 12/16/2015 1423 Full Code [id number]  Demetrios Loll, MD Inpatient   11/03/2015 0211 11/04/2015 1739 Full Code [id number]  Lily Kocher, MD Inpatient   09/21/2015 1930 09/24/2015 1613 Full Code [id number]  Vianne Bulls, MD ED   02/14/2015 2341 02/17/2015 1814 Full Code [id number]  Lavina Hamman, MD ED      TOTAL TIME TAKING CARE OF THIS PATIENT: 38 minutes.    Note: This dictation was prepared with Dragon dictation  along with smaller phrase technology. Any transcriptional errors that result from this process are unintentional.  Burnetta Kohls M.D on 03/14/2018 at 10:58 AM  Between 7am to 6pm - Pager - [phone] After 6pm go to [email or web address] - password [email or web address] [facility]  Office  [phone]  CC: Primary care physician; Tresa Garter, MD

## 2018-03-14 NOTE — Progress Notes (Signed)
Pt discharged per MD order. IV removed Discharge instructions reviewed with pt. Pt understands he will have to call and make his own follow-up on Monday. Pt verbalized understanding of discharge instructions and all questions answered to pt satisfaction. Pt declined wheelchair and walked downstairs to leave.

## 2018-03-14 NOTE — Progress Notes (Signed)
Pharmacy Antibiotic Note  LIRON EISSLER is a 52 y.o. male admitted on 03/12/2018 with pneumonia.  Pharmacy has been consulted for levaquin dosing.  Plan: levaquin 776m iv x1 followed by levaquin 5059miv q48h thereafter   Weight: 191 lb 5.8 oz (86.8 kg)  Temp (24hrs), Avg:98.4 F (36.9 C), Min:97.6 F (36.4 C), Max:99 F (37.2 C)  Recent Labs  Lab 03/12/18 2220 03/12/18 2247 03/13/18 1835 03/14/18 0546  WBC 5.8  --  4.1 3.0*  CREATININE 10.87*  --  12.91* 7.51*  LATICACIDVEN  --  0.9  --   --     Estimated Creatinine Clearance: 11.9 mL/min (A) (by C-G formula based on SCr of 7.51 mg/dL (H)).    Allergies  Allergen Reactions  . Lisinopril Swelling    Facial swelling    Antimicrobials this admission: Anti-infectives (From admission, onward)   Start     Dose/Rate Route Frequency Ordered Stop   03/16/18 0800  levofloxacin (LEVAQUIN) IVPB 500 mg     500 mg 100 mL/hr over 60 Minutes Intravenous Every 48 hours 03/14/18 1014     03/14/18 1015  levofloxacin (LEVAQUIN) IVPB 750 mg     750 mg 100 mL/hr over 90 Minutes Intravenous  Once 03/14/18 1013     03/13/18 1200  vancomycin (VANCOCIN) IVPB 1000 mg/200 mL premix  Status:  Discontinued     1,000 mg 200 mL/hr over 60 Minutes Intravenous Every M-W-F (Hemodialysis) 03/13/18 0959 03/14/18 0950   03/13/18 1000  piperacillin-tazobactam (ZOSYN) IVPB 3.375 g  Status:  Discontinued     3.375 g 12.5 mL/hr over 240 Minutes Intravenous Every 12 hours 03/13/18 0959 03/14/18 0950   03/12/18 2300  vancomycin (VANCOCIN) 1,750 mg in sodium chloride 0.9 % 500 mL IVPB     1,750 mg 250 mL/hr over 120 Minutes Intravenous  Once 03/12/18 2246 03/13/18 0430   03/12/18 2300  piperacillin-tazobactam (ZOSYN) IVPB 3.375 g     3.375 g 100 mL/hr over 30 Minutes Intravenous  Once 03/12/18 2246 03/13/18 0200       Microbiology results: Recent Results (from the past 240 hour(s))  Blood culture (routine x 2)     Status: None (Preliminary result)   Collection Time: 03/12/18 11:06 PM  Result Value Ref Range Status   Specimen Description BLOOD LEFT FORARM  Final   Special Requests   Final    BOTTLES DRAWN AEROBIC AND ANAEROBIC Blood Culture results may not be optimal due to an inadequate volume of blood received in culture bottles   Culture   Final    NO GROWTH 2 DAYS [facility] [id number]  Report Status PENDING  Incomplete  Blood culture (routine x 2)     Status: None (Preliminary result)   Collection Time: 03/12/18 11:55 PM  Result Value Ref Range Status   Specimen Description BLOOD Blood Culture adequate volume  Final   Special Requests   Final    BOTTLES DRAWN AEROBIC AND ANAEROBIC BLOOD RIGHT HAND   Culture   Final    NO GROWTH 1 DAY [facility]formed at [facility] [place] [id number]  Report Status PENDING  Incomplete  MRSA PCR Screening     Status: None   Collection Time: 03/13/18  1:32 AM  Result Value Ref Range Status   MRSA by PCR NEGATIVE NEGATIVE Final    Comment:        The GeneXpert MRSA  Assay (FDA approved for NASAL specimens only), is one component of a comprehensive MRSA colonization surveillance program. It is not intended to diagnose MRSA infection nor to guide or monitor treatment for MRSA infections. Performed at [facility], [address] [place], [place] [address]      Thank you for allowing pharmacy to be a part of this patient's care.  Donna Christen Doye Montilla 03/14/2018 10:14 AM

## 2018-03-14 NOTE — Care Management (Signed)
Elvera Bicker dialysis liaison notified of discharge

## 2018-03-15 DIAGNOSIS — Z992 Dependence on renal dialysis: Secondary | ICD-10-CM | POA: Diagnosis not present

## 2018-03-15 DIAGNOSIS — N186 End stage renal disease: Secondary | ICD-10-CM | POA: Diagnosis not present

## 2018-03-15 DIAGNOSIS — T8610 Unspecified complication of kidney transplant: Secondary | ICD-10-CM | POA: Diagnosis not present

## 2018-03-17 LAB — CULTURE, BLOOD (ROUTINE X 2): Culture: NO GROWTH

## 2018-03-18 DIAGNOSIS — N2581 Secondary hyperparathyroidism of renal origin: Secondary | ICD-10-CM | POA: Diagnosis not present

## 2018-03-18 DIAGNOSIS — N186 End stage renal disease: Secondary | ICD-10-CM | POA: Diagnosis not present

## 2018-03-18 DIAGNOSIS — D631 Anemia in chronic kidney disease: Secondary | ICD-10-CM | POA: Diagnosis not present

## 2018-03-19 LAB — CULTURE, BLOOD (ROUTINE X 2)
CULTURE: NO GROWTH
SPECIMEN DESCRIPTION: ADEQUATE

## 2018-03-20 ENCOUNTER — Emergency Department: Payer: Medicare Other

## 2018-03-20 ENCOUNTER — Emergency Department
Admission: EM | Admit: 2018-03-20 | Discharge: 2018-03-20 | Disposition: A | Discharge status: Home / Self Care | Payer: Medicare Other | Attending: Emergency Medicine | Admitting: Emergency Medicine

## 2018-03-20 ENCOUNTER — Other Ambulatory Visit: Payer: Self-pay

## 2018-03-20 ENCOUNTER — Encounter: Payer: Self-pay | Admitting: Emergency Medicine

## 2018-03-20 DIAGNOSIS — T8249XA Other complication of vascular dialysis catheter, initial encounter: Secondary | ICD-10-CM | POA: Diagnosis not present

## 2018-03-20 DIAGNOSIS — N186 End stage renal disease: Secondary | ICD-10-CM | POA: Insufficient documentation

## 2018-03-20 DIAGNOSIS — Y658 Other specified misadventures during surgical and medical care: Secondary | ICD-10-CM | POA: Diagnosis not present

## 2018-03-20 DIAGNOSIS — T8242XA Displacement of vascular dialysis catheter, initial encounter: Secondary | ICD-10-CM

## 2018-03-20 DIAGNOSIS — N2581 Secondary hyperparathyroidism of renal origin: Secondary | ICD-10-CM | POA: Diagnosis not present

## 2018-03-20 DIAGNOSIS — Z992 Dependence on renal dialysis: Secondary | ICD-10-CM | POA: Insufficient documentation

## 2018-03-20 DIAGNOSIS — I120 Hypertensive chronic kidney disease with stage 5 chronic kidney disease or end stage renal disease: Secondary | ICD-10-CM | POA: Insufficient documentation

## 2018-03-20 DIAGNOSIS — D631 Anemia in chronic kidney disease: Secondary | ICD-10-CM | POA: Diagnosis not present

## 2018-03-20 DIAGNOSIS — J45909 Unspecified asthma, uncomplicated: Secondary | ICD-10-CM | POA: Insufficient documentation

## 2018-03-20 DIAGNOSIS — Z4682 Encounter for fitting and adjustment of non-vascular catheter: Secondary | ICD-10-CM | POA: Diagnosis not present

## 2018-03-20 NOTE — ED Notes (Signed)
First Nurse Note:  Patient states his perma cath in left upper chest has "come out about 3 inches".  Dressing intact.

## 2018-03-20 NOTE — ED Notes (Signed)
Pt states dialysis port came out a few inches last night, NAD noted, denies pain or drainage at site.

## 2018-03-20 NOTE — ED Provider Notes (Signed)
[facility] Emergency Department Provider Note       Time seen: ----------------------------------------- 9:01 AM on 03/20/2018 -----------------------------------------   I have reviewed the triage vital signs and the nursing notes.  HISTORY   Chief Complaint Dialysis cath assessment    HPI Jonathon Conley is a 52 y.o. male with a history of anemia, chronic kidney disease on dialysis, colitis, diverticulosis, hyperlipidemia and hypertension who presents to the ED for dialysis catheter problem.  Patient states he has had the catheter for some time and he feels like it has been pulled out several inches.  Patient states he was working normally on Monday during his dialysis episode.  He denies any pain.  Past Medical History:  Diagnosis Date  . Anemia   . Asthma    childhood  . Blood transfusion without reported diagnosis   . Chronic kidney disease    Dialysis Mon, Wed, Fri  . Colitis   . Diverticulosis 04/12/16   also seen: sigmoid and rectal erythema, path:   . Hemorrhoids 03/2016   bleeding at 04/12/16 colonoscopy, treated with  APC laser. cauterization  . Hyperlipidemia   . Hypertension   . OSA (obstructive sleep apnea) 03/27/2013    Patient Active Problem List   Diagnosis Date Noted  . FUO (fever of unknown origin) 03/13/2018  . Viral respiratory infection 03/05/2018  . Cough 11/14/2017  . Lower respiratory infection 11/14/2017  . History of fever 11/14/2017  . SIRS (systemic inflammatory response syndrome) ([place]) 12/19/2016  . End stage renal disease ([place]) 12/19/2016  . Hyponatremia 12/19/2016  . Anemia of chronic disease 12/19/2016  . Postprocedural seroma of a musculoskeletal structure following other procedure 12/17/2016  . Hematochezia 04/08/2016  . HTN (hypertension) 04/08/2016  . Intravenous line infection ([place]) 04/08/2016  . Insomnia 01/04/2016  . Malignant hypertension 12/14/2015  . Symptomatic anemia 11/03/2015  . Chronic anemia  11/03/2015  . Thrombocytopenia ([place]) 11/03/2015  . Pleural effusion on right 11/03/2015  . Shortness of breath 11/02/2015  . Sepsis ([place]) 09/23/2015  . ESRD on dialysis ([place]) 09/22/2015  . Accelerated hypertension 09/22/2015  . Leg edema, left   . HCAP (healthcare-associated pneumonia) 09/21/2015  . Bacteremia due to Gram-positive bacteria 08/20/2015  . Acute blood loss anemia 08/12/2015  . C. difficile colitis 08/12/2015  . Bleeding gastrointestinal 08/12/2015  . Acute-on-chronic renal failure ([place]) 08/10/2015  . H/O kidney transplant 08/08/2015  . Non compliance with medical treatment 08/08/2015  . History of anemia 08/08/2015  . AKI (acute kidney injury) ([place]) 02/14/2015  . Renal transplant recipient 02/14/2015  . Acute on chronic diastolic congestive heart failure ([place]) 02/14/2015  . Anemia 02/14/2015  . Hyperlipidemia   . OSA (obstructive sleep apnea) 03/27/2013    Past Surgical History:  Procedure Laterality Date  . APPLICATION OF WOUND VAC Right 12/13/2016   Procedure: APPLICATION OF WOUND VAC;  Surgeon: Algernon Huxley, MD;  Location: [facility] ORS;  Service: Vascular;  Laterality: Right;  . APPLICATION OF WOUND VAC Right 12/19/2016   Procedure: APPLICATION OF WOUND VAC;  Surgeon: Algernon Huxley, MD;  Location: [facility] ORS;  Service: Vascular;  Laterality: Right;  . COLONOSCOPY N/A 04/12/2016   Procedure: COLONOSCOPY;  Surgeon: Mauri Pole, MD;  Location: [place] ENDOSCOPY;  Service: Endoscopy;  Laterality: N/A;  . DIALYSIS/PERMA CATHETER INSERTION N/A 02/26/2017   Procedure: Dialysis/Perma Catheter Insertion;  Surgeon: Algernon Huxley, MD;  Location: [place] CV LAB;  Service: Cardiovascular;  Laterality: N/A;  . EXCHANGE OF A DIALYSIS CATHETER  11/20/2016   Procedure: Exchange Of A Dialysis Catheter;  Surgeon: Algernon Huxley, MD;  Location: [place] CV LAB;  Service: Cardiovascular;;  . fistulagrams Right   . HEMATOMA EVACUATION Right 12/13/2016   Procedure: EVACUATION HEMATOMA;   Surgeon: Algernon Huxley, MD;  Location: [facility] ORS;  Service: Vascular;  Laterality: Right;  . HEMATOMA EVACUATION Right 12/19/2016   Procedure: EVACUATIONof seroma;  Surgeon: Algernon Huxley, MD;  Location: [facility] ORS;  Service: Vascular;  Laterality: Right;  . HOT HEMOSTASIS N/A 04/12/2016   Procedure: HOT HEMOSTASIS (ARGON PLASMA COAGULATION/BICAP);  Surgeon: Mauri Pole, MD;  Location: [facility] ENDOSCOPY;  Service: Endoscopy;  Laterality: N/A;  . KIDNEY TRANSPLANT    . PERIPHERAL VASCULAR CATHETERIZATION Bilateral 10/02/2016   Procedure: Upper Extremity Venography;  Surgeon: Algernon Huxley, MD;  Location: [place] CV LAB;  Service: Cardiovascular;  Laterality: Bilateral;  . PERIPHERAL VASCULAR CATHETERIZATION Right 11/08/2016   Procedure: DIALYSIS/PERMA CATHETER REMOVAL right Jugular;  Surgeon: Algernon Huxley, MD;  Location: [facility] ORS;  Service: Vascular;  Laterality: Right;  . PERIPHERAL VASCULAR CATHETERIZATION Left 11/08/2016   Procedure: DIALYSIS/PERMA CATHETER INSERTION left jugular with u/s guide and flouroscan;  Surgeon: Algernon Huxley, MD;  Location: [facility] ORS;  Service: Vascular;  Laterality: Left;  . PERIPHERAL VASCULAR CATHETERIZATION N/A 11/08/2016   Procedure: IVC FILTER INSERTION;  Surgeon: Algernon Huxley, MD;  Location: [facility] ORS;  Service: Vascular;  Laterality: N/A;  . perm a cath in Rt upper chest Right   . THORACENTESIS    . VASCULAR ACCESS DEVICE INSERTION Right 11/08/2016   Procedure: INSERTION OF HERO VASCULAR ACCESS DEVICE ( GRAFT );  Surgeon: Algernon Huxley, MD;  Location: [facility] ORS;  Service: Vascular;  Laterality: Right;    Allergies Lisinopril  Social History Social History   Tobacco Use  . Smoking status: Never Smoker  . Smokeless tobacco: Never Used  Substance Use Topics  . Alcohol use: No  . Drug use: No   Review of Systems Constitutional: Negative for fever. Cardiovascular: Negative for chest pain. Respiratory: Negative for shortness of breath. Gastrointestinal: Negative for  abdominal pain, vomiting and diarrhea. Musculoskeletal: Negative for musculoskeletal pain Skin: Negative for rash. Neurological: Negative for headaches, focal weakness or numbness.  All systems negative/normal/unremarkable except as stated in the HPI  ____________________________________________   PHYSICAL EXAM:  VITAL SIGNS: ED Triage Vitals  Enc Vitals Group     BP 03/20/18 0848 (!) 145/96     Pulse Rate 03/20/18 0848 67     Resp 03/20/18 0848 18     Temp 03/20/18 0848 98.2 F (36.8 C)     Temp Source 03/20/18 0848 Oral     SpO2 03/20/18 0848 99 %     Weight 03/20/18 0849 185 lb 3 oz (84 kg)     Height 03/20/18 0849 5' 10"  (1.778 m)     Head Circumference --      Peak Flow --      Pain Score 03/20/18 0849 0     Pain Loc --      Pain Edu? --      Excl. in [place]? --    Constitutional: Alert and oriented. Well appearing and in no distress. Eyes: Conjunctivae are normal. Normal extraocular movements. Cardiovascular: Normal rate, regular rhythm. No murmurs, rubs, or gallops. Respiratory: Normal respiratory effort without tachypnea nor retractions. Breath sounds are clear and equal bilaterally. No wheezes/rales/rhonchi.  Left upper chest tunneled catheter does appear to be further out of the  chest and expected Musculoskeletal: Nontender with normal range of motion in extremities. No lower extremity tenderness nor edema. Neurologic:  Normal speech and language. No gross focal neurologic deficits are appreciated.  Skin:  Skin is warm, dry and intact. No rash noted.  Catheter insertion site is unremarkable, no erythema Psychiatric: Mood and affect are normal. Speech and behavior are normal.  ____________________________________________  ED COURSE:  As part of my medical decision making, I reviewed the following data within the [facility] History obtained from family if available, nursing notes, old chart and ekg, as well as notes from prior ED visits. Patient  presented for dialysis catheter problems, we will assess with imaging as indicated at this time.   Procedures ____________________________________________   RADIOLOGY Images were viewed by me  Chest x-ray IMPRESSION: Interval exchange of the left internal jugular catheter, new catheter terminating near the confluence of the brachiocephalic veins. Right IJ dialysis catheter and pleuroparenchymal scarring on the right are unchanged. No pneumothorax. ____________________________________________  DIFFERENTIAL DIAGNOSIS   Catheter dislodgment, infection unlikely  FINAL ASSESSMENT AND PLAN  Dialysis catheter problem   Plan: The patient had presented for his dialysis catheter being dislodged somewhat.  The dialysis catheter still terminates in the brachiocephalic veins and should be usable.  I discussed with nephrology who will refer him for exchange with vascular surgery.  He is cleared to go to dialysis today.  I have applied Steri-Strips to better secure the catheter.   Laurence Aly, MD   Note: This note was generated in part or whole with voice recognition software. Voice recognition is usually quite accurate but there are transcription errors that can and very often do occur. I apologize for any typographical errors that were not detected and corrected.     Earleen Newport, MD 03/20/18 1104

## 2018-03-20 NOTE — ED Triage Notes (Signed)
Pt states he noticed his dialysis cath has come out about 3 inches, unsure how this happened. Supposed to have dialysis today, however they told him to come here for assessment.

## 2018-03-21 ENCOUNTER — Encounter (HOSPITAL_COMMUNITY): Payer: Self-pay

## 2018-03-21 ENCOUNTER — Emergency Department (HOSPITAL_COMMUNITY)
Admission: EM | Admit: 2018-03-21 | Discharge: 2018-03-21 | Disposition: A | Discharge status: Home / Self Care | Payer: Medicare Other | Attending: Emergency Medicine | Admitting: Emergency Medicine

## 2018-03-21 ENCOUNTER — Encounter (INDEPENDENT_AMBULATORY_CARE_PROVIDER_SITE_OTHER): Payer: Self-pay

## 2018-03-21 DIAGNOSIS — Z7982 Long term (current) use of aspirin: Secondary | ICD-10-CM | POA: Insufficient documentation

## 2018-03-21 DIAGNOSIS — Z79899 Other long term (current) drug therapy: Secondary | ICD-10-CM | POA: Diagnosis not present

## 2018-03-21 DIAGNOSIS — R58 Hemorrhage, not elsewhere classified: Secondary | ICD-10-CM | POA: Diagnosis not present

## 2018-03-21 DIAGNOSIS — Y828 Other medical devices associated with adverse incidents: Secondary | ICD-10-CM | POA: Insufficient documentation

## 2018-03-21 DIAGNOSIS — T8249XA Other complication of vascular dialysis catheter, initial encounter: Secondary | ICD-10-CM | POA: Diagnosis not present

## 2018-03-21 DIAGNOSIS — T8242XA Displacement of vascular dialysis catheter, initial encounter: Secondary | ICD-10-CM | POA: Diagnosis not present

## 2018-03-21 DIAGNOSIS — Z992 Dependence on renal dialysis: Secondary | ICD-10-CM | POA: Insufficient documentation

## 2018-03-21 DIAGNOSIS — N186 End stage renal disease: Secondary | ICD-10-CM | POA: Insufficient documentation

## 2018-03-21 DIAGNOSIS — I5032 Chronic diastolic (congestive) heart failure: Secondary | ICD-10-CM | POA: Diagnosis not present

## 2018-03-21 DIAGNOSIS — I132 Hypertensive heart and chronic kidney disease with heart failure and with stage 5 chronic kidney disease, or end stage renal disease: Secondary | ICD-10-CM | POA: Insufficient documentation

## 2018-03-21 DIAGNOSIS — I10 Essential (primary) hypertension: Secondary | ICD-10-CM | POA: Diagnosis not present

## 2018-03-21 NOTE — ED Provider Notes (Signed)
[place] EMERGENCY DEPARTMENT Provider Note   CSN: [id number] Arrival date & time: 03/21/18  1524     History   Chief Complaint Chief Complaint  Patient presents with  . Injury    Dialysis port dislodgement    HPI Jonathon Conley is a 52 y.o. male.  Patient is a 52 year old male with a history of end-stage renal disease on dialysis with a PermCath in his left subclavian who is presenting today because his PermCath got pulled out.  He was seen yesterday at the emergency room because he felt that it had been pulled out a little bit.  At that time he was cleared to have dialysis which he had a full course of dialysis yesterday and was planned to have an exchange done this following Wednesday.  However today he was moving a couch and when he sat the couch down he looked down and saw blood in the catheter laying on the ground.  He currently has no complaints.  He does take an aspirin daily but does not take any other anticoagulation.  He currently has no complaints at this time.  The history is provided by the patient.  Injury  This is a new problem. The current episode started 1 to 2 hours ago. The problem occurs constantly. The problem has not changed since onset.   Past Medical History:  Diagnosis Date  . Anemia   . Asthma    childhood  . Blood transfusion without reported diagnosis   . Chronic kidney disease    Dialysis Mon, Wed, Fri  . Colitis   . Diverticulosis 04/12/16   also seen: sigmoid and rectal erythema, path:   . Hemorrhoids 03/2016   bleeding at 04/12/16 colonoscopy, treated with  APC laser. cauterization  . Hyperlipidemia   . Hypertension   . OSA (obstructive sleep apnea) 03/27/2013    Patient Active Problem List   Diagnosis Date Noted  . FUO (fever of unknown origin) 03/13/2018  . Viral respiratory infection 03/05/2018  . Cough 11/14/2017  . Lower respiratory infection 11/14/2017  . History of fever 11/14/2017  . SIRS (systemic  inflammatory response syndrome) ([place]) 12/19/2016  . End stage renal disease ([place]) 12/19/2016  . Hyponatremia 12/19/2016  . Anemia of chronic disease 12/19/2016  . Postprocedural seroma of a musculoskeletal structure following other procedure 12/17/2016  . Hematochezia 04/08/2016  . HTN (hypertension) 04/08/2016  . Intravenous line infection ([place]) 04/08/2016  . Insomnia 01/04/2016  . Malignant hypertension 12/14/2015  . Symptomatic anemia 11/03/2015  . Chronic anemia 11/03/2015  . Thrombocytopenia ([place]) 11/03/2015  . Pleural effusion on right 11/03/2015  . Shortness of breath 11/02/2015  . Sepsis ([place]) 09/23/2015  . ESRD on dialysis ([place]) 09/22/2015  . Accelerated hypertension 09/22/2015  . Leg edema, left   . HCAP (healthcare-associated pneumonia) 09/21/2015  . Bacteremia due to Gram-positive bacteria 08/20/2015  . Acute blood loss anemia 08/12/2015  . C. difficile colitis 08/12/2015  . Bleeding gastrointestinal 08/12/2015  . Acute-on-chronic renal failure ([place]) 08/10/2015  . H/O kidney transplant 08/08/2015  . Non compliance with medical treatment 08/08/2015  . History of anemia 08/08/2015  . AKI (acute kidney injury) ([place]) 02/14/2015  . Renal transplant recipient 02/14/2015  . Acute on chronic diastolic congestive heart failure ([place]) 02/14/2015  . Anemia 02/14/2015  . Hyperlipidemia   . OSA (obstructive sleep apnea) 03/27/2013    Past Surgical History:  Procedure Laterality Date  . APPLICATION OF WOUND VAC Right 12/13/2016   Procedure: APPLICATION OF  WOUND VAC;  Surgeon: Algernon Huxley, MD;  Location: [facility] ORS;  Service: Vascular;  Laterality: Right;  . APPLICATION OF WOUND VAC Right 12/19/2016   Procedure: APPLICATION OF WOUND VAC;  Surgeon: Algernon Huxley, MD;  Location: [facility] ORS;  Service: Vascular;  Laterality: Right;  . COLONOSCOPY N/A 04/12/2016   Procedure: COLONOSCOPY;  Surgeon: Mauri Pole, MD;  Location: [place] ENDOSCOPY;  Service: Endoscopy;  Laterality: N/A;  .  DIALYSIS/PERMA CATHETER INSERTION N/A 02/26/2017   Procedure: Dialysis/Perma Catheter Insertion;  Surgeon: Algernon Huxley, MD;  Location: [place] CV LAB;  Service: Cardiovascular;  Laterality: N/A;  . EXCHANGE OF A DIALYSIS CATHETER  11/20/2016   Procedure: Exchange Of A Dialysis Catheter;  Surgeon: Algernon Huxley, MD;  Location: [place] CV LAB;  Service: Cardiovascular;;  . fistulagrams Right   . HEMATOMA EVACUATION Right 12/13/2016   Procedure: EVACUATION HEMATOMA;  Surgeon: Algernon Huxley, MD;  Location: [facility] ORS;  Service: Vascular;  Laterality: Right;  . HEMATOMA EVACUATION Right 12/19/2016   Procedure: EVACUATIONof seroma;  Surgeon: Algernon Huxley, MD;  Location: [facility] ORS;  Service: Vascular;  Laterality: Right;  . HOT HEMOSTASIS N/A 04/12/2016   Procedure: HOT HEMOSTASIS (ARGON PLASMA COAGULATION/BICAP);  Surgeon: Mauri Pole, MD;  Location: [facility] ENDOSCOPY;  Service: Endoscopy;  Laterality: N/A;  . KIDNEY TRANSPLANT    . PERIPHERAL VASCULAR CATHETERIZATION Bilateral 10/02/2016   Procedure: Upper Extremity Venography;  Surgeon: Algernon Huxley, MD;  Location: [place] CV LAB;  Service: Cardiovascular;  Laterality: Bilateral;  . PERIPHERAL VASCULAR CATHETERIZATION Right 11/08/2016   Procedure: DIALYSIS/PERMA CATHETER REMOVAL right Jugular;  Surgeon: Algernon Huxley, MD;  Location: [facility] ORS;  Service: Vascular;  Laterality: Right;  . PERIPHERAL VASCULAR CATHETERIZATION Left 11/08/2016   Procedure: DIALYSIS/PERMA CATHETER INSERTION left jugular with u/s guide and flouroscan;  Surgeon: Algernon Huxley, MD;  Location: [facility] ORS;  Service: Vascular;  Laterality: Left;  . PERIPHERAL VASCULAR CATHETERIZATION N/A 11/08/2016   Procedure: IVC FILTER INSERTION;  Surgeon: Algernon Huxley, MD;  Location: [facility] ORS;  Service: Vascular;  Laterality: N/A;  . perm a cath in Rt upper chest Right   . THORACENTESIS    . VASCULAR ACCESS DEVICE INSERTION Right 11/08/2016   Procedure: INSERTION OF HERO VASCULAR ACCESS DEVICE (  GRAFT );  Surgeon: Algernon Huxley, MD;  Location: [facility] ORS;  Service: Vascular;  Laterality: Right;        Home Medications    Prior to Admission medications   Medication Sig Start Date End Date Taking? Authorizing Provider  amLODipine (NORVASC) 10 MG tablet Take 10 mg by mouth daily.    [provider]  aspirin EC 81 MG tablet Take 81 mg by mouth daily.     [provider]  calcitRIOL (ROCALTROL) 0.5 MCG capsule Take 1 capsule (0.5 mcg total) by mouth Every Tuesday,Thursday,and Saturday with dialysis. 09/24/15   Orson Eva, MD  calcium acetate (PHOSLO) 667 MG capsule Take 667 mg by mouth 3 (three) times daily with meals.    [provider]  carvedilol (COREG) 25 MG tablet Take 25 mg by mouth 2 (two) times daily.     [provider]  cinacalcet (SENSIPAR) 30 MG tablet Take 30 mg by mouth 3 (three) times a week. Wyvonnia Lora, Sat after dialysis    [provider]  ferrous sulfate 325 (65 FE) MG tablet Take 325 mg by mouth 3 (three) times daily with meals. Reported on 04/09/2016  [provider]  levofloxacin (LEVAQUIN) 500 MG tablet Take 1 tablet (500 mg total) by mouth every other day for 7 days. Start 6/1 03/16/18 03/23/18  Bettey Costa, MD  Mesalamine 800 MG TBEC Take 1 tablet (800 mg total) by mouth 3 (three) times daily. 05/03/16   Esterwood, Amy S, PA-C  simvastatin (ZOCOR) 5 MG tablet Take 1 tablet (5 mg total) by mouth at bedtime. 02/17/15   Ghimire, Henreitta Leber, MD  spironolactone (ALDACTONE) 25 MG tablet Take 25 mg by mouth daily.     [provider]    Family History Family History  Problem Relation Age of Onset  . Heart disease Mother   . Cancer Mother        ovarian  . Cancer Maternal Grandmother   . Cancer Maternal Grandfather   . Asthma Son   . Asthma Son     Social History Social History   Tobacco Use  . Smoking status: Never Smoker  . Smokeless tobacco: Never Used  Substance Use Topics  . Alcohol use: No  .  Drug use: No     Allergies   Lisinopril   Review of Systems Review of Systems  All other systems reviewed and are negative.    Physical Exam Updated Vital Signs There were no vitals taken for this visit.  Physical Exam  Constitutional: He is oriented to person, place, and time. He appears well-developed and well-nourished. No distress.  HENT:  Head: Normocephalic and atraumatic.  Mouth/Throat: Oropharynx is clear and moist.  Eyes: Pupils are equal, round, and reactive to light. Conjunctivae and EOM are normal.  Neck: Normal range of motion. Neck supple.  Cardiovascular: Normal rate, regular rhythm and intact distal pulses.  No murmur heard. Pulmonary/Chest: Effort normal and breath sounds normal. No respiratory distress. He has no wheezes. He has no rales.    Musculoskeletal: Normal range of motion. He exhibits no edema or tenderness.  Neurological: He is alert and oriented to person, place, and time.  Skin: Skin is warm and dry. No rash noted. No erythema.  Psychiatric: He has a normal mood and affect. His behavior is normal.  Nursing note and vitals reviewed.    ED Treatments / Results  Labs (all labs ordered are listed, but only abnormal results are displayed) Labs Reviewed - No data to display  EKG None  Radiology Dg Chest 1 View  Result Date: 03/20/2018 CLINICAL DATA:  Assess catheter placement. EXAM: CHEST  1 VIEW COMPARISON:  CT 03/13/2018.  Radiographs 03/12/2018. FINDINGS: 0857 hour. Interval new left IJ central venous catheter terminates at the confluence of the brachiocephalic veins. Right IJ dialysis graft is unchanged at the level of the upper right atrium. The heart size and mediastinal contours are stable. There is stable pleural thickening on the right and right basilar scarring. The left lung is clear. There is no pneumothorax. IMPRESSION: Interval exchange of the left internal jugular catheter, new catheter terminating near the confluence of the  brachiocephalic veins. Right IJ dialysis catheter and pleuroparenchymal scarring on the right are unchanged. No pneumothorax. Electronically Signed   By: Richardean Sale M.D.   On: 03/20/2018 09:43    Procedures Procedures (including critical care time)  Medications Ordered in ED Medications - No data to display   Initial Impression / Assessment and Plan / ED Course  I have reviewed the triage vital signs and the nursing notes.  Pertinent labs & imaging results that were available during my care of the patient were  reviewed by me and considered in my medical decision making (see chart for details).     Patient is a 52 year old male presenting today because his left internal jugular dialysis catheter was pulled out while he was moving a couch.  He states he did come out a little the other day and was seen at the emergency room yesterday but was cleared to have dialysis however today it came completely out.  He currently has no complaints.  His last dialysis was yesterday and was a full course.  We will attempt to contact his nephrologist Trinda Pascal so that he can be scheduled for an earlier exchange.  He was supposed to have an exchange on Wednesday but he will most likely need dialysis before this.  Spoke with Dr. Augustin Coupe who will place cath tomorrow morning at 7am.  Final Clinical Impressions(s) / ED Diagnoses   Final diagnoses:  Displacement of vascular dialysis catheter, initial encounter [facility])    ED Discharge Orders    None       Blanchie Dessert, MD 03/21/18 1629

## 2018-03-21 NOTE — ED Triage Notes (Signed)
Pt arrived via [facility]; per EMS; pt was working at home moving furniture and accidentally snagged and dislodged dialysis cath; 162/84, 98, 16

## 2018-03-21 NOTE — ED Notes (Signed)
Pt discharged from ED; instructions provided; Pt encouraged to return to ED if symptoms worsen and to f/u with PCP; Pt verbalized understanding of all instructions

## 2018-03-21 NOTE — Discharge Instructions (Addendum)
Keep catheter opening covered until you go to the appointment tomorrow.  Make sure you have a driver to take you to the appointment.  Make sure you have nothing to eat after midnight.  Make sure you are they are by 7 AM.  Then contact your dialysis center to see if they can dialyze you later tomorrow

## 2018-03-22 DIAGNOSIS — T82898A Other specified complication of vascular prosthetic devices, implants and grafts, initial encounter: Secondary | ICD-10-CM | POA: Diagnosis not present

## 2018-03-22 DIAGNOSIS — Z992 Dependence on renal dialysis: Secondary | ICD-10-CM | POA: Diagnosis not present

## 2018-03-22 DIAGNOSIS — N186 End stage renal disease: Secondary | ICD-10-CM | POA: Diagnosis not present

## 2018-03-25 ENCOUNTER — Other Ambulatory Visit (INDEPENDENT_AMBULATORY_CARE_PROVIDER_SITE_OTHER): Payer: Self-pay | Admitting: Vascular Surgery

## 2018-03-25 DIAGNOSIS — N2581 Secondary hyperparathyroidism of renal origin: Secondary | ICD-10-CM | POA: Diagnosis not present

## 2018-03-25 DIAGNOSIS — D631 Anemia in chronic kidney disease: Secondary | ICD-10-CM | POA: Diagnosis not present

## 2018-03-25 DIAGNOSIS — N186 End stage renal disease: Secondary | ICD-10-CM | POA: Diagnosis not present

## 2018-03-25 MED ORDER — CEFAZOLIN SODIUM-DEXTROSE 1-4 GM/50ML-% IV SOLN: 1.0000 g | Freq: Once | INTRAVENOUS | Status: DC

## 2018-03-26 ENCOUNTER — Telehealth (INDEPENDENT_AMBULATORY_CARE_PROVIDER_SITE_OTHER): Payer: Self-pay | Admitting: Vascular Surgery

## 2018-03-26 ENCOUNTER — Ambulatory Visit
Admission: RE | Admit: 2018-03-26 | Discharge: 2018-03-26 | Disposition: A | Discharge status: Home / Self Care | Payer: Medicare Other | Source: Ambulatory Visit | Attending: Vascular Surgery | Admitting: Vascular Surgery

## 2018-03-26 ENCOUNTER — Encounter
Admission: RE | Disposition: A | Discharge status: Home / Self Care | Payer: Self-pay | Source: Ambulatory Visit | Attending: Vascular Surgery

## 2018-03-26 DIAGNOSIS — E785 Hyperlipidemia, unspecified: Secondary | ICD-10-CM | POA: Insufficient documentation

## 2018-03-26 DIAGNOSIS — N186 End stage renal disease: Secondary | ICD-10-CM | POA: Diagnosis not present

## 2018-03-26 DIAGNOSIS — Y832 Surgical operation with anastomosis, bypass or graft as the cause of abnormal reaction of the patient, or of later complication, without mention of misadventure at the time of the procedure: Secondary | ICD-10-CM | POA: Insufficient documentation

## 2018-03-26 DIAGNOSIS — T82868A Thrombosis of vascular prosthetic devices, implants and grafts, initial encounter: Secondary | ICD-10-CM | POA: Diagnosis not present

## 2018-03-26 DIAGNOSIS — I120 Hypertensive chronic kidney disease with stage 5 chronic kidney disease or end stage renal disease: Secondary | ICD-10-CM | POA: Diagnosis not present

## 2018-03-26 DIAGNOSIS — G4733 Obstructive sleep apnea (adult) (pediatric): Secondary | ICD-10-CM | POA: Diagnosis not present

## 2018-03-26 DIAGNOSIS — T8241XA Breakdown (mechanical) of vascular dialysis catheter, initial encounter: Secondary | ICD-10-CM | POA: Insufficient documentation

## 2018-03-26 HISTORY — PX: DIALYSIS/PERMA CATHETER INSERTION: CATH118288

## 2018-03-26 LAB — POTASSIUM (ARMC VASCULAR LAB ONLY): POTASSIUM (ARMC VASCULAR LAB): 4.2 (ref 3.5–5.1)

## 2018-03-26 SURGERY — DIALYSIS/PERMA CATHETER INSERTION
Anesthesia: Moderate Sedation

## 2018-03-26 MED ORDER — METHYLPREDNISOLONE SODIUM SUCC 125 MG IJ SOLR: 125.0000 mg | INTRAMUSCULAR | Status: DC | PRN

## 2018-03-26 MED ORDER — HEPARIN SODIUM (PORCINE) 10000 UNIT/ML IJ SOLN
INTRAMUSCULAR | Status: AC
  Filled 2018-03-26: qty 1

## 2018-03-26 MED ORDER — LIDOCAINE-EPINEPHRINE (PF) 1 %-1:200000 IJ SOLN
INTRAMUSCULAR | Status: AC
  Filled 2018-03-26: qty 30

## 2018-03-26 MED ORDER — HYDROCODONE-ACETAMINOPHEN 5-325 MG PO TABS: 1.0000 | ORAL_TABLET | Freq: Four times a day (QID) | ORAL | 0 refills | Status: DC | PRN

## 2018-03-26 MED ORDER — SODIUM CHLORIDE 0.9 % IV SOLN: INTRAVENOUS | Status: DC

## 2018-03-26 MED ORDER — ONDANSETRON HCL 4 MG/2ML IJ SOLN: 4.0000 mg | Freq: Four times a day (QID) | INTRAMUSCULAR | Status: DC | PRN

## 2018-03-26 MED ORDER — HYDROMORPHONE HCL 1 MG/ML IJ SOLN: 1.0000 mg | Freq: Once | INTRAMUSCULAR | Status: DC | PRN

## 2018-03-26 MED ORDER — HEPARIN (PORCINE) IN NACL 1000-0.9 UT/500ML-% IV SOLN
INTRAVENOUS | Status: AC
  Filled 2018-03-26: qty 500

## 2018-03-26 MED ORDER — SODIUM CHLORIDE FLUSH 0.9 % IV SOLN
INTRAVENOUS | Status: AC
  Filled 2018-03-26: qty 10

## 2018-03-26 MED ORDER — FENTANYL CITRATE (PF) 100 MCG/2ML IJ SOLN
INTRAMUSCULAR | Status: AC
  Filled 2018-03-26: qty 2

## 2018-03-26 MED ORDER — MIDAZOLAM HCL 5 MG/5ML IJ SOLN
INTRAMUSCULAR | Status: AC
  Filled 2018-03-26: qty 5

## 2018-03-26 MED ORDER — FAMOTIDINE 20 MG PO TABS: 40.0000 mg | ORAL_TABLET | ORAL | Status: DC | PRN

## 2018-03-26 SURGICAL SUPPLY — 1 items: PACK ANGIOGRAPHY (CUSTOM PROCEDURE TRAY) IMPLANT

## 2018-03-26 NOTE — H&P (Signed)
 [facility] History & Physical Update  The patient was interviewed and re-examined.  The patient's previous History and Physical has been reviewed and is unchanged.  There is no change in the plan of care. We plan to proceed with the scheduled procedure.  Hortencia Pilar, MD  03/26/2018, 12:31 PM

## 2018-03-26 NOTE — Progress Notes (Addendum)
IV attempt x 6 by multiple RNs with no success. VIR notified as well as attestation needed for consent.

## 2018-03-26 NOTE — Op Note (Signed)
[facility]  Percutaneous Study/Intervention Procedural Note   Date of Surgery: 03/26/2018,12:31 PM  Surgeon:Emyah Roznowski, Dolores Lory   Pre-operative Diagnosis: Catheter malfunction Post-operative diagnosis:  Same  Procedure(s) Performed:  1.  Dressing change for intact functioning catheter  Anesthesia: None  Sheath: None Contrast: None cc   Fluoroscopy Time: None minutes  Indications: Patient sent by dialysis center because his catheter has fallen out  Procedure:  Jonathon Conley a 52 y.o. male who was identified and appropriate procedural time out was performed.  The patient was then placed supine on the table and prepped and draped in the usual sterile fashion.  Fluoroscopy was then used to evaluate the catheter.  Catheter is noted to be in excellent position with its tips in the mid atrium it is smooth in contour and free of kinks.  Inspection now revealed that the catheter exit site has a moderate amount of clotted blood.  This is cleaned with peroxide and the catheter is now well visualized and noted to be completely intact.  He has a medium hematoma associated with the catheter insertion.  There is no active bleeding.  Patient's catheter was aspirated works well and then flushed with 5000 units of heparin per lumen.  A new sterile dressing was applied with Biopatch.  Disposition: Patient was taken to the recovery room in stable condition having tolerated the procedure well.  Belenda Cruise Kunio Cummiskey 03/26/2018,12:31 PM

## 2018-03-27 DIAGNOSIS — D631 Anemia in chronic kidney disease: Secondary | ICD-10-CM | POA: Diagnosis not present

## 2018-03-27 DIAGNOSIS — N2581 Secondary hyperparathyroidism of renal origin: Secondary | ICD-10-CM | POA: Diagnosis not present

## 2018-03-27 DIAGNOSIS — N186 End stage renal disease: Secondary | ICD-10-CM | POA: Diagnosis not present

## 2018-03-29 DIAGNOSIS — N2581 Secondary hyperparathyroidism of renal origin: Secondary | ICD-10-CM | POA: Diagnosis not present

## 2018-03-29 DIAGNOSIS — D631 Anemia in chronic kidney disease: Secondary | ICD-10-CM | POA: Diagnosis not present

## 2018-03-29 DIAGNOSIS — N186 End stage renal disease: Secondary | ICD-10-CM | POA: Diagnosis not present

## 2018-04-01 DIAGNOSIS — N186 End stage renal disease: Secondary | ICD-10-CM | POA: Diagnosis not present

## 2018-04-01 DIAGNOSIS — D631 Anemia in chronic kidney disease: Secondary | ICD-10-CM | POA: Diagnosis not present

## 2018-04-01 DIAGNOSIS — N2581 Secondary hyperparathyroidism of renal origin: Secondary | ICD-10-CM | POA: Diagnosis not present

## 2018-04-03 ENCOUNTER — Encounter: Payer: Self-pay | Admitting: Vascular Surgery

## 2018-04-05 DIAGNOSIS — N186 End stage renal disease: Secondary | ICD-10-CM | POA: Diagnosis not present

## 2018-04-05 DIAGNOSIS — N2581 Secondary hyperparathyroidism of renal origin: Secondary | ICD-10-CM | POA: Diagnosis not present

## 2018-04-05 DIAGNOSIS — D631 Anemia in chronic kidney disease: Secondary | ICD-10-CM | POA: Diagnosis not present

## 2018-04-08 DIAGNOSIS — N186 End stage renal disease: Secondary | ICD-10-CM | POA: Diagnosis not present

## 2018-04-08 DIAGNOSIS — D631 Anemia in chronic kidney disease: Secondary | ICD-10-CM | POA: Diagnosis not present

## 2018-04-08 DIAGNOSIS — N2581 Secondary hyperparathyroidism of renal origin: Secondary | ICD-10-CM | POA: Diagnosis not present

## 2018-04-09 ENCOUNTER — Other Ambulatory Visit: Payer: Self-pay

## 2018-04-09 ENCOUNTER — Emergency Department: Payer: Medicare Other

## 2018-04-09 ENCOUNTER — Inpatient Hospital Stay
Admission: EM | Admit: 2018-04-09 | Discharge: 2018-04-11 | DRG: 291 | Disposition: A | Discharge status: Home / Self Care | Payer: Medicare Other | Attending: Internal Medicine | Admitting: Internal Medicine

## 2018-04-09 ENCOUNTER — Encounter: Payer: Self-pay | Admitting: Emergency Medicine

## 2018-04-09 ENCOUNTER — Inpatient Hospital Stay
Admit: 2018-04-09 | Discharge: 2018-04-09 | Disposition: A | Discharge status: Home / Self Care | Payer: Medicare Other | Attending: Internal Medicine | Admitting: Internal Medicine

## 2018-04-09 DIAGNOSIS — E8770 Fluid overload, unspecified: Secondary | ICD-10-CM | POA: Diagnosis not present

## 2018-04-09 DIAGNOSIS — D638 Anemia in other chronic diseases classified elsewhere: Secondary | ICD-10-CM | POA: Diagnosis present

## 2018-04-09 DIAGNOSIS — Z992 Dependence on renal dialysis: Secondary | ICD-10-CM

## 2018-04-09 DIAGNOSIS — I132 Hypertensive heart and chronic kidney disease with heart failure and with stage 5 chronic kidney disease, or end stage renal disease: Secondary | ICD-10-CM | POA: Diagnosis not present

## 2018-04-09 DIAGNOSIS — Z79899 Other long term (current) drug therapy: Secondary | ICD-10-CM

## 2018-04-09 DIAGNOSIS — I10 Essential (primary) hypertension: Secondary | ICD-10-CM | POA: Diagnosis not present

## 2018-04-09 DIAGNOSIS — Z940 Kidney transplant status: Secondary | ICD-10-CM

## 2018-04-09 DIAGNOSIS — G4733 Obstructive sleep apnea (adult) (pediatric): Secondary | ICD-10-CM | POA: Diagnosis present

## 2018-04-09 DIAGNOSIS — Z888 Allergy status to other drugs, medicaments and biological substances status: Secondary | ICD-10-CM | POA: Diagnosis not present

## 2018-04-09 DIAGNOSIS — I82B12 Acute embolism and thrombosis of left subclavian vein: Secondary | ICD-10-CM | POA: Diagnosis present

## 2018-04-09 DIAGNOSIS — Z7982 Long term (current) use of aspirin: Secondary | ICD-10-CM

## 2018-04-09 DIAGNOSIS — I5031 Acute diastolic (congestive) heart failure: Secondary | ICD-10-CM | POA: Diagnosis not present

## 2018-04-09 DIAGNOSIS — I509 Heart failure, unspecified: Secondary | ICD-10-CM | POA: Diagnosis not present

## 2018-04-09 DIAGNOSIS — N186 End stage renal disease: Secondary | ICD-10-CM | POA: Diagnosis present

## 2018-04-09 DIAGNOSIS — I5033 Acute on chronic diastolic (congestive) heart failure: Secondary | ICD-10-CM | POA: Diagnosis present

## 2018-04-09 DIAGNOSIS — R0989 Other specified symptoms and signs involving the circulatory and respiratory systems: Secondary | ICD-10-CM | POA: Diagnosis not present

## 2018-04-09 DIAGNOSIS — Z9989 Dependence on other enabling machines and devices: Secondary | ICD-10-CM

## 2018-04-09 DIAGNOSIS — E785 Hyperlipidemia, unspecified: Secondary | ICD-10-CM | POA: Diagnosis present

## 2018-04-09 DIAGNOSIS — N2581 Secondary hyperparathyroidism of renal origin: Secondary | ICD-10-CM | POA: Diagnosis present

## 2018-04-09 DIAGNOSIS — R0600 Dyspnea, unspecified: Secondary | ICD-10-CM | POA: Diagnosis not present

## 2018-04-09 DIAGNOSIS — J9601 Acute respiratory failure with hypoxia: Secondary | ICD-10-CM | POA: Diagnosis present

## 2018-04-09 DIAGNOSIS — J45909 Unspecified asthma, uncomplicated: Secondary | ICD-10-CM | POA: Diagnosis present

## 2018-04-09 DIAGNOSIS — D631 Anemia in chronic kidney disease: Secondary | ICD-10-CM | POA: Diagnosis not present

## 2018-04-09 DIAGNOSIS — I120 Hypertensive chronic kidney disease with stage 5 chronic kidney disease or end stage renal disease: Secondary | ICD-10-CM | POA: Diagnosis not present

## 2018-04-09 DIAGNOSIS — R0602 Shortness of breath: Secondary | ICD-10-CM | POA: Diagnosis not present

## 2018-04-09 DIAGNOSIS — R601 Generalized edema: Secondary | ICD-10-CM

## 2018-04-09 HISTORY — DX: Heart failure, unspecified: I50.9

## 2018-04-09 LAB — CBC
HCT: 23.8 % — ABNORMAL LOW (ref 40.0–52.0)
HEMOGLOBIN: 8 g/dL — AB (ref 13.0–18.0)
MCH: 29 pg (ref 26.0–34.0)
MCHC: 33.6 g/dL (ref 32.0–36.0)
MCV: 86.1 fL (ref 80.0–100.0)
Platelets: 62 10*3/uL — ABNORMAL LOW (ref 150–440)
RBC: 2.76 MIL/uL — AB (ref 4.40–5.90)
RDW: 19.2 % — ABNORMAL HIGH (ref 11.5–14.5)
WBC: 2.3 10*3/uL — ABNORMAL LOW (ref 3.8–10.6)

## 2018-04-09 LAB — BASIC METABOLIC PANEL
ANION GAP: 11 (ref 5–15)
BUN: 18 mg/dL (ref 6–20)
CALCIUM: 9 mg/dL (ref 8.9–10.3)
CO2: 30 mmol/L (ref 22–32)
CREATININE: 9.26 mg/dL — AB (ref 0.61–1.24)
Chloride: 96 mmol/L — ABNORMAL LOW (ref 98–111)
GFR calc non Af Amer: 6 mL/min — ABNORMAL LOW (ref >60)
GFR, EST AFRICAN AMERICAN: 7 mL/min — AB (ref >60)
Glucose, Bld: 97 mg/dL (ref 70–99)
Potassium: 3.7 mmol/L (ref 3.5–5.1)
Sodium: 137 mmol/L (ref 135–145)

## 2018-04-09 LAB — BRAIN NATRIURETIC PEPTIDE: B Natriuretic Peptide: 2428 pg/mL — ABNORMAL HIGH (ref 0.0–100.0)

## 2018-04-09 LAB — TROPONIN I
Troponin I: <0.03 ng/mL (ref <0.03)
Troponin I: <0.03 ng/mL (ref <0.03)

## 2018-04-09 MED ORDER — ACETAMINOPHEN 650 MG RE SUPP: 650.0000 mg | Freq: Four times a day (QID) | RECTAL | Status: DC | PRN

## 2018-04-09 MED ORDER — LOSARTAN POTASSIUM 50 MG PO TABS
100.0000 mg | ORAL_TABLET | Freq: Every day | ORAL | Status: DC
  Administered 2018-04-09 – 2018-04-11 (×3): 100 mg via ORAL
  Filled 2018-04-09 (×3): qty 2

## 2018-04-09 MED ORDER — HYDROCODONE-ACETAMINOPHEN 5-325 MG PO TABS
1.0000 | ORAL_TABLET | Freq: Four times a day (QID) | ORAL | Status: DC | PRN
  Administered 2018-04-09 – 2018-04-10 (×4): 2 via ORAL
  Filled 2018-04-09 (×4): qty 2

## 2018-04-09 MED ORDER — ONDANSETRON HCL 4 MG/2ML IJ SOLN
4.0000 mg | Freq: Four times a day (QID) | INTRAMUSCULAR | Status: DC | PRN
  Administered 2018-04-09 – 2018-04-10 (×2): 4 mg via INTRAVENOUS
  Filled 2018-04-09 (×2): qty 2

## 2018-04-09 MED ORDER — ATENOLOL 50 MG PO TABS: 50.0000 mg | ORAL_TABLET | Freq: Every day | ORAL | Status: DC

## 2018-04-09 MED ORDER — SPIRONOLACTONE 25 MG PO TABS
25.0000 mg | ORAL_TABLET | Freq: Every day | ORAL | Status: DC
  Administered 2018-04-09: 25 mg via ORAL
  Filled 2018-04-09: qty 1

## 2018-04-09 MED ORDER — CHLORHEXIDINE GLUCONATE CLOTH 2 % EX PADS
6.0000 | MEDICATED_PAD | Freq: Every day | CUTANEOUS | Status: DC
  Administered 2018-04-10: 6 via TOPICAL

## 2018-04-09 MED ORDER — ASPIRIN EC 81 MG PO TBEC
81.0000 mg | DELAYED_RELEASE_TABLET | Freq: Every day | ORAL | Status: DC
  Administered 2018-04-09 – 2018-04-11 (×3): 81 mg via ORAL
  Filled 2018-04-09 (×3): qty 1

## 2018-04-09 MED ORDER — SIMVASTATIN 5 MG PO TABS
5.0000 mg | ORAL_TABLET | Freq: Every day | ORAL | Status: DC
  Administered 2018-04-09 – 2018-04-10 (×2): 5 mg via ORAL
  Filled 2018-04-09 (×3): qty 1

## 2018-04-09 MED ORDER — HYDRALAZINE HCL 20 MG/ML IJ SOLN
10.0000 mg | Freq: Four times a day (QID) | INTRAMUSCULAR | Status: DC | PRN
  Administered 2018-04-09: 10 mg via INTRAVENOUS
  Filled 2018-04-09: qty 1

## 2018-04-09 MED ORDER — ACETAMINOPHEN 325 MG PO TABS
650.0000 mg | ORAL_TABLET | Freq: Four times a day (QID) | ORAL | Status: DC | PRN
  Administered 2018-04-10: 650 mg via ORAL
  Filled 2018-04-09 (×2): qty 2

## 2018-04-09 MED ORDER — CALCITRIOL 0.25 MCG PO CAPS: 0.5000 ug | ORAL_CAPSULE | ORAL | Status: DC

## 2018-04-09 MED ORDER — HEPARIN SODIUM (PORCINE) 5000 UNIT/ML IJ SOLN
5000.0000 [IU] | Freq: Three times a day (TID) | INTRAMUSCULAR | Status: DC
  Filled 2018-04-09 (×3): qty 1

## 2018-04-09 MED ORDER — HYDRALAZINE HCL 20 MG/ML IJ SOLN: 10.0000 mg | INTRAMUSCULAR | Status: DC | PRN

## 2018-04-09 MED ORDER — ONDANSETRON HCL 4 MG PO TABS: 4.0000 mg | ORAL_TABLET | Freq: Four times a day (QID) | ORAL | Status: DC | PRN

## 2018-04-09 MED ORDER — BENZONATATE 100 MG PO CAPS
100.0000 mg | ORAL_CAPSULE | Freq: Two times a day (BID) | ORAL | Status: DC | PRN
  Administered 2018-04-09: 100 mg via ORAL
  Filled 2018-04-09: qty 1

## 2018-04-09 MED ORDER — AMLODIPINE BESYLATE 10 MG PO TABS
10.0000 mg | ORAL_TABLET | Freq: Every day | ORAL | Status: DC
  Administered 2018-04-09 – 2018-04-11 (×3): 10 mg via ORAL
  Filled 2018-04-09 (×3): qty 1

## 2018-04-09 MED ORDER — FERROUS SULFATE 325 (65 FE) MG PO TABS
325.0000 mg | ORAL_TABLET | Freq: Three times a day (TID) | ORAL | Status: DC
  Administered 2018-04-09 – 2018-04-11 (×4): 325 mg via ORAL
  Filled 2018-04-09 (×4): qty 1

## 2018-04-09 MED ORDER — MESALAMINE 400 MG PO CPDR
800.0000 mg | DELAYED_RELEASE_CAPSULE | Freq: Three times a day (TID) | ORAL | Status: DC
  Filled 2018-04-09 (×9): qty 2

## 2018-04-09 MED ORDER — CARVEDILOL 25 MG PO TABS
25.0000 mg | ORAL_TABLET | Freq: Two times a day (BID) | ORAL | Status: DC
  Administered 2018-04-09 – 2018-04-11 (×4): 25 mg via ORAL
  Filled 2018-04-09 (×4): qty 1

## 2018-04-09 MED ORDER — CALCIUM ACETATE (PHOS BINDER) 667 MG PO CAPS
667.0000 mg | ORAL_CAPSULE | Freq: Three times a day (TID) | ORAL | Status: DC
  Administered 2018-04-09 – 2018-04-11 (×4): 667 mg via ORAL
  Filled 2018-04-09 (×4): qty 1

## 2018-04-09 NOTE — ED Notes (Signed)
Hospitalist to bedside at this time 

## 2018-04-09 NOTE — ED Notes (Signed)
Attempted to call report; RN unavailable at this time.

## 2018-04-09 NOTE — ED Triage Notes (Signed)
Pt presents to ED c/o shortness of breath ongoing for 3 weeks but worsening last night. Reports cough productive of clear sputum. Hc CHF, on dialysis. Reports he has been keeping his dialysis appointments as scheduled.

## 2018-04-09 NOTE — Progress Notes (Signed)
[facility]  ROUNDING NOTE   Subjective:   [name] admitted to [facility] on 04/09/2018 for Shortness of breath [R06.02]  Last hemodialysis was yesterday. Wife at bedside. Patient states he has been getting progressive shortness of breath for last few days. He states he feels weak and tired.   He was recently started on carvedilol along with his home dose of atenolol. Also he has been refusing IV iron and Mircera at hemodialysis. His hemoglobin is 8.   Also it seems patient has been arriving to dialysis below his estimated dry weight and so he was not getting any ultrafiltration.   Objective:  Vital signs in last 24 hours:  Temp:  [97.8 F (36.6 C)-98.2 F (36.8 C)] 98.2 F (36.8 C) (06/25 1628) Pulse Rate:  [66-77] 77 (06/25 1737) Resp:  [11-20] 20 (06/25 1628) BP: (186-205)/(105-120) 205/114 (06/25 1737) SpO2:  [90 %-98 %] 97 % (06/25 1737) Weight:  [83.9 kg (185 lb)] 83.9 kg (185 lb) (06/25 1234)  Weight change:  Filed Weights   04/09/18 1234  Weight: 83.9 kg (185 lb)    Intake/Output: No intake/output data recorded.   Intake/Output this shift:  No intake/output data recorded.  Physical Exam: General: NAD,   Head: Normocephalic, atraumatic. Moist oral mucosal membranes  Eyes: Anicteric, PERRL  Neck: Supple, trachea midline  Lungs:  Clear to auscultation  Heart: Regular rate and rhythm  Abdomen:  Soft, nontender,   Extremities: Right arm ++ edema  Neurologic: Nonfocal, moving all four extremities  Skin: No lesions  Access: Left IJ permcath    Basic Metabolic Panel: Recent Labs  Lab 04/09/18 1344  NA 137  K 3.7  CL 96*  CO2 30  GLUCOSE 97  BUN 18  CREATININE 9.26*  CALCIUM 9.0    Liver Function Tests: No results for input(s): AST, ALT, ALKPHOS, BILITOT, PROT, ALBUMIN in the last 168 hours. No results for input(s): LIPASE, AMYLASE in the last 168 hours. No results for input(s): AMMONIA in the last 168 hours.  CBC: Recent Labs   Lab 04/09/18 1344  WBC 2.3*  HGB 8.0*  HCT 23.8*  MCV 86.1  PLT 62*    Cardiac Enzymes: Recent Labs  Lab 04/09/18 1344  TROPONINI <0.03    BNP: Invalid input(s): POCBNP  CBG: No results for input(s): GLUCAP in the last 168 hours.  Microbiology: Results for orders placed or performed during the hospital encounter of 03/12/18  Blood culture (routine x 2)     Status: None   Collection Time: 03/12/18 11:06 PM  Result Value Ref Range Status   Specimen Description BLOOD LEFT FORARM  Final   Special Requests   Final    BOTTLES DRAWN AEROBIC AND ANAEROBIC Blood Culture results may not be optimal due to an inadequate volume of blood received in culture bottles   Culture   Final    NO GROWTH 5 DAYS Performed at [facility], [address] [place], [place] [address]    Report Status 03/17/2018 FINAL  Final  Blood culture (routine x 2)     Status: None   Collection Time: 03/12/18 11:55 PM  Result Value Ref Range Status   Specimen Description BLOOD Blood Culture adequate volume  Final   Special Requests   Final    BOTTLES DRAWN AEROBIC AND ANAEROBIC BLOOD RIGHT HAND   Culture   Final    NO GROWTH 6 DAYS Performed at [facility], [address], [place], [place] [address]  Report Status 03/19/2018 FINAL  Final  MRSA PCR Screening     Status: None   Collection Time: 03/13/18  1:32 AM  Result Value Ref Range Status   MRSA by PCR NEGATIVE NEGATIVE Final    Comment:        The GeneXpert MRSA Assay (FDA approved for NASAL specimens only), is one component of a comprehensive MRSA colonization surveillance program. It is not intended to diagnose MRSA infection nor to guide or monitor treatment for MRSA infections. Performed at [facility], [address] [place], [place] [address]   Respiratory Panel by PCR     Status: None   Collection Time: 03/13/18  4:21 PM  Result Value Ref Range Status   Adenovirus NOT DETECTED NOT DETECTED Final    Coronavirus 229E NOT DETECTED NOT DETECTED Final   Coronavirus HKU1 NOT DETECTED NOT DETECTED Final   Coronavirus NL63 NOT DETECTED NOT DETECTED Final   Coronavirus OC43 NOT DETECTED NOT DETECTED Final   Metapneumovirus NOT DETECTED NOT DETECTED Final   Rhinovirus / Enterovirus NOT DETECTED NOT DETECTED Final   Influenza A NOT DETECTED NOT DETECTED Final   Influenza B NOT DETECTED NOT DETECTED Final   Parainfluenza Virus 1 NOT DETECTED NOT DETECTED Final   Parainfluenza Virus 2 NOT DETECTED NOT DETECTED Final   Parainfluenza Virus 3 NOT DETECTED NOT DETECTED Final   Parainfluenza Virus 4 NOT DETECTED NOT DETECTED Final   Respiratory Syncytial Virus NOT DETECTED NOT DETECTED Final   Bordetella pertussis NOT DETECTED NOT DETECTED Final   Chlamydophila pneumoniae NOT DETECTED NOT DETECTED Final   Mycoplasma pneumoniae NOT DETECTED NOT DETECTED Final    Comment: Performed at [facility], [place] [address] [place], [place] [address]    Coagulation Studies: No results for input(s): LABPROT, INR in the last 72 hours.  Urinalysis: No results for input(s): COLORURINE, LABSPEC, PHURINE, GLUCOSEU, HGBUR, BILIRUBINUR, KETONESUR, PROTEINUR, UROBILINOGEN, NITRITE, LEUKOCYTESUR in the last 72 hours.  Invalid input(s): APPERANCEUR    Imaging: Dg Chest 2 View  Result Date: 04/09/2018 CLINICAL DATA:  Worsening shortness of breath. EXAM: CHEST - 2 VIEW COMPARISON:  Chest x-ray dated March 20, 2018. FINDINGS: Unchanged tunneled left internal jugular dialysis catheter with the tip in the proximal right atrium. Unchanged HERO graft in the right upper extremity terminating at the cavoatrial junction. The heart size and mediastinal contours are within normal limits. New mild pulmonary vascular congestion. Unchanged right pleural thickening and basilar scarring. Trace left pleural effusion. No pneumothorax. No acute osseous abnormality. IMPRESSION: 1. New mild pulmonary vascular congestion and trace left  pleural effusion. 2. Unchanged right pleuroparenchymal scarring. Electronically Signed   By: Titus Dubin M.D.   On: 04/09/2018 13:03     Medications:    . amLODipine  10 mg Oral Daily  . aspirin EC  81 mg Oral Daily  . calcium acetate  667 mg Oral TID WC  . carvedilol  25 mg Oral BID  . ferrous sulfate  325 mg Oral TID WC  . heparin  5,000 Units Subcutaneous Q8H  . losartan  100 mg Oral Daily  . Mesalamine  800 mg Oral TID  . simvastatin  5 mg Oral QHS   acetaminophen **OR** acetaminophen, benzonatate, hydrALAZINE, HYDROcodone-acetaminophen, ondansetron **OR** ondansetron (ZOFRAN) IV  Assessment/ Plan:  [name] is a 52 y.o. black male with end-stage renal disease on hemodialysis, hemodialysis from 1997 -2001, then with decease renal transplant until October 2016.  Past medical history of  hypertension, anemia, hyperlipidemia.   [facility]/MWF  1.  ESRD on HD MWF. No acute indication for hemodialysis tonight. Plan on treatment tomorrow.   2. Right upper extremity edema:  - Check venous doppler  3. Anemia with renal failure: hemoglobin 8. Has been missing doses of Mircera and IV iron.  - EPO with HD treatment.  - No indication for PRBC transfusion.   4. Hypertension: urgent on admission.  - IV hydralazine PRN - discontinue atenolol - discontinue spironolactone - Continue carvedilol - Start new medication: losartan - monitor closely since patient has history of angioedema with lisinopril.   5. Secondary Hyperparathyroidism:  - Discontinue calcitriol - Calcium acetate with meals.    LOS: 0 Yaniyah Koors 6/25/20196:10 PM

## 2018-04-09 NOTE — H&P (Signed)
[facility] [place] NAME: Jonathon Conley    MR#:  [id number]  DATE OF BIRTH:  09-Jun-1966  DATE OF ADMISSION:  04/09/2018  PRIMARY CARE PHYSICIAN: Tresa Garter, MD   REQUESTING/REFERRING PHYSICIAN: Harvest Dark, MD  CHIEF COMPLAINT:   Chief Complaint  Patient presents with  . Shortness of Breath    HISTORY OF PRESENT ILLNESS: Jonathon Conley  is a 52 y.o. male with a known history ERDS , essential hypertension hyperlipidemia, obstructive sleep apnea who was recently hospitalized for dehydration.  Since then he is having less fluid removed during dialysis.  She presents to the emergency room complaining of shortness of breath.  He says worst over the past few days however he states that he has been having shortness of breath for the past 1 month.  Denies any chest pain or palpitations.   He complains of nausea but no vomiting or diarrhea   PAST MEDICAL HISTORY:   Past Medical History:  Diagnosis Date  . Anemia   . Asthma    childhood  . Blood transfusion without reported diagnosis   . CHF (congestive heart failure) ([place])   . Chronic kidney disease    Dialysis Mon, Wed, Fri  . Colitis   . Diverticulosis 04/12/16   also seen: sigmoid and rectal erythema, path:   . Hemorrhoids 03/2016   bleeding at 04/12/16 colonoscopy, treated with  APC laser. cauterization  . Hyperlipidemia   . Hypertension   . OSA (obstructive sleep apnea) 03/27/2013    PAST SURGICAL HISTORY:  Past Surgical History:  Procedure Laterality Date  . APPLICATION OF WOUND VAC Right 12/13/2016   Procedure: APPLICATION OF WOUND VAC;  Surgeon: Algernon Huxley, MD;  Location: [facility] ORS;  Service: Vascular;  Laterality: Right;  . APPLICATION OF WOUND VAC Right 12/19/2016   Procedure: APPLICATION OF WOUND VAC;  Surgeon: Algernon Huxley, MD;  Location: [facility] ORS;  Service: Vascular;  Laterality: Right;  . COLONOSCOPY N/A 04/12/2016   Procedure: COLONOSCOPY;  Surgeon: Mauri Pole, MD;  Location: [facility];  Service: Endoscopy;  Laterality: N/A;  . DIALYSIS/PERMA CATHETER INSERTION N/A 02/26/2017   Procedure: Dialysis/Perma Catheter Insertion;  Surgeon: Algernon Huxley, MD;  Location: [place] [facility];  Service: Cardiovascular;  Laterality: N/A;  . DIALYSIS/PERMA CATHETER INSERTION N/A 03/26/2018   Procedure: DIALYSIS/PERMA CATHETER INSERTION;  Surgeon: Katha Cabal, MD;  Location: [place] [facility];  Service: Cardiovascular;  Laterality: N/A;  . EXCHANGE OF A DIALYSIS CATHETER  11/20/2016   Procedure: Exchange Of A Dialysis Catheter;  Surgeon: Algernon Huxley, MD;  Location: [place] [facility];  Service: Cardiovascular;;  . fistulagrams Right   . HEMATOMA EVACUATION Right 12/13/2016   Procedure: EVACUATION HEMATOMA;  Surgeon: Algernon Huxley, MD;  Location: [facility];  Service: Vascular;  Laterality: Right;  . HEMATOMA EVACUATION Right 12/19/2016   Procedure: EVACUATIONof seroma;  Surgeon: Algernon Huxley, MD;  Location: [facility] OR[facility];  Service: Vascular;  Laterality: Right;  . HOT HEMOSTASIS N/A 04/12/2016   Procedure: HOT HEMOSTASIS (ARGON PLASMA COAGULATION/BICAP);  Surgeon: Mauri Pole, MD;  Location: [facility] END[facility]COPY;  Service: Endoscopy;  Laterality: N/A;  . KIDNEY TRANSPLANT    . PERIPHERAL VASCULAR CATHETERIZATION Bilateral 10/02/2016   Procedure: Upper Extremity Venography;  Surgeon: Algernon Huxley, MD;  Location: [place] [facility];  Service: Cardiovascular;  Laterality: Bilateral;  . PERIPHERAL VASCULAR CATHETERIZATION Right 11/08/2016   Procedure: DIALYSIS/PERMA CATHETER REMOVAL right Jugular;  Surgeon: Algernon Huxley, MD;  Location: [facility] OR[facility];  Service: Vascular;  Laterality: Right;  . PERIPHERAL VASCULAR CATHETERIZATION Left 11/08/2016   Procedure: DIALYSIS/PERMA CATHETER INSERTION left jugular with u/s guide and flouroscan;  Surgeon: Algernon Huxley, MD;  Location: [facility] OR[facility];  Service: Vascular;  Laterality: Left;  . PERIPHERAL VASCULAR CATHETERIZATION N/A 11/08/2016    Procedure: IVC FILTER INSERTION;  Surgeon: Algernon Huxley, MD;  Location: [facility] ORS;  Service: Vascular;  Laterality: N/A;  . perm a cath in Rt upper chest Right   . THORACENTESIS    . VASCULAR ACCESS DEVICE INSERTION Right 11/08/2016   Procedure: INSERTION OF HERO VASCULAR ACCESS DEVICE ( GRAFT );  Surgeon: Algernon Huxley, MD;  Location: [facility] ORS;  Service: Vascular;  Laterality: Right;    SOCIAL HISTORY:  Social History   Tobacco Use  . Smoking status: Never Smoker  . Smokeless tobacco: Never Used  Substance Use Topics  . Alcohol use: No    FAMILY HISTORY:  Family History  Problem Relation Age of Onset  . Heart disease Mother   . Cancer Mother        ovarian  . Cancer Maternal Grandmother   . Cancer Maternal Grandfather   . Asthma Son   . Asthma Son     DRUG ALLERGIES:  Allergies  Allergen Reactions  . Lisinopril Swelling    Facial swelling    REVIEW OF SYSTEMS:   CONSTITUTIONAL: No fever, fatigue or weakness.  EYES: No blurred or double vision.  EARS, NOSE, AND THROAT: No tinnitus or ear pain.  RESPIRATORY: No cough, positive shortness of breath, no wheezing or no hemoptysis.  CARDIOVASCULAR: No chest pain, orthopnea, edema.  GASTROINTESTINAL: Positive nausea, no vomiting, diarrhea or abdominal pain.  GENITOURINARY: No dysuria, hematuria.  ENDOCRINE: No polyuria, nocturia,  HEMATOLOGY: No anemia, easy bruising or bleeding SKIN: No rash or lesion. MUSCULOSKELETAL: No joint pain or arthritis.   NEUROLOGIC: No tingling, numbness, weakness.  PSYCHIATRY: No anxiety or depression.   MEDICATIONS AT HOME:  Prior to Admission medications   Medication Sig Start Date End Date Taking? Authorizing Provider  amLODipine (NORVASC) 10 MG tablet Take 10 mg by mouth daily.    [provider]  aspirin EC 81 MG tablet Take 81 mg by mouth daily.     [provider]  atenolol (TENORMIN) 50 MG tablet Take 50 mg by mouth daily. 11/21/17 11/21/18  [provider]   benzonatate (TESSALON) 100 MG capsule Take 100 mg by mouth 2 (two) times daily as needed for cough. 03/08/18   [provider]  calcitRIOL (ROCALTROL) 0.5 MCG capsule Take 1 capsule (0.5 mcg total) by mouth Every Tuesday,Thursday,and Saturday with dialysis. 09/24/15   Orson Eva, MD  calcium acetate (PHOSLO) 667 MG capsule Take 667 mg by mouth 3 (three) times daily with meals.    [provider]  carvedilol (COREG) 25 MG tablet Take 25 mg by mouth 2 (two) times daily.     [provider]  ferrous sulfate 325 (65 FE) MG tablet Take 325 mg by mouth 3 (three) times daily with meals. Reported on 04/09/2016    [provider]  HYDROcodone-acetaminophen (NORCO) 5-325 MG tablet Take 1-2 tablets by mouth every 6 (six) hours as needed. 03/26/18   Schnier, Dolores Lory, MD  Mesalamine 800 MG TBEC Take 1 tablet (800 mg total) by mouth 3 (three) times daily. 05/03/16   Esterwood, Amy S, PA-C  simvastatin (ZOCOR) 5 MG tablet Take 1 tablet (  5 mg total) by mouth at bedtime. 02/17/15   Ghimire, Henreitta Leber, MD  spironolactone (ALDACTONE) 25 MG tablet Take 25 mg by mouth daily.     [provider]      PHYSICAL EXAMINATION:   VITAL SIGNS: Blood pressure (!) 202/120, pulse 69, temperature 97.8 F (36.6 C), temperature source Oral, resp. rate 11, height 5' 10"  (1.778 m), weight 83.9 kg (185 lb), SpO2 98 %.  GENERAL:  52 y.o.-year-old patient lying in the bed with no acute distress.  EYES: Pupils equal, round, reactive to light and accommodation. No scleral icterus. Extraocular muscles intact.  HEENT: Head atraumatic, normocephalic. Oropharynx and nasopharynx clear.  NECK:  Supple, no jugular venous distention. No thyroid enlargement, no tenderness.  LUNGS: Crackles at the bases without accessory muscle usage CARDIOVASCULAR: S1, S2 normal. No murmurs, rubs, or gallops.  ABDOMEN: Soft, nontender, nondistended. Bowel sounds present. No organomegaly or mass.  EXTREMITIES: No pedal  edema, cyanosis, or clubbing.  NEUROLOGIC: Cranial nerves II through XII are intact. Muscle strength 5/5 in all extremities. Sensation intact. Gait not checked.  PSYCHIATRIC: The patient is alert and oriented x 3.  SKIN: No obvious rash, lesion, or ulcer.   LABORATORY PANEL:   CBC Recent Labs  Lab 04/09/18 1344  WBC 2.3*  HGB 8.0*  HCT 23.8*  PLT 62*  MCV 86.1  MCH 29.0  MCHC 33.6  RDW 19.2*   ------------------------------------------------------------------------------------------------------------------  Chemistries  Recent Labs  Lab 04/09/18 1344  NA 137  K 3.7  CL 96*  CO2 30  GLUCOSE 97  BUN 18  CREATININE 9.26*  CALCIUM 9.0   ------------------------------------------------------------------------------------------------------------------ estimated creatinine clearance is 9.7 mL/min (A) (by C-G formula based on SCr of 9.26 mg/dL (H)). ------------------------------------------------------------------------------------------------------------------ No results for input(s): TSH, T4TOTAL, T3FREE, THYROIDAB in the last 72 hours.  Invalid input(s): FREET3   Coagulation profile No results for input(s): INR, PROTIME in the last 168 hours. ------------------------------------------------------------------------------------------------------------------- No results for input(s): DDIMER in the last 72 hours. -------------------------------------------------------------------------------------------------------------------  Cardiac Enzymes Recent Labs  Lab 04/09/18 1344  TROPONINI <0.03   ------------------------------------------------------------------------------------------------------------------ Invalid input(s): POCBNP  ---------------------------------------------------------------------------------------------------------------  Urinalysis    Component Value Date/Time   COLORURINE YELLOW 02/14/2015 2213   APPEARANCEUR CLEAR 02/14/2015 2213    LABSPEC 1.015 02/14/2015 2213   PHURINE 5.5 02/14/2015 2213   GLUCOSEU 100 (A) 02/14/2015 2213   HGBUR LARGE (A) 02/14/2015 2213   BILIRUBINUR NEGATIVE 02/14/2015 2213   KETONESUR NEGATIVE 02/14/2015 2213   PROTEINUR >300 (A) 02/14/2015 2213   UROBILINOGEN 0.2 02/14/2015 2213   NITRITE NEGATIVE 02/14/2015 2213   LEUKOCYTESUR NEGATIVE 02/14/2015 2213     RADIOLOGY: Dg Chest 2 View  Result Date: 04/09/2018 CLINICAL DATA:  Worsening shortness of breath. EXAM: CHEST - 2 VIEW COMPARISON:  Chest x-ray dated March 20, 2018. FINDINGS: Unchanged tunneled left internal jugular dialysis catheter with the tip in the proximal right atrium. Unchanged HERO graft in the right upper extremity terminating at the cavoatrial junction. The heart size and mediastinal contours are within normal limits. New mild pulmonary vascular congestion. Unchanged right pleural thickening and basilar scarring. Trace left pleural effusion. No pneumothorax. No acute osseous abnormality. IMPRESSION: 1. New mild pulmonary vascular congestion and trace left pleural effusion. 2. Unchanged right pleuroparenchymal scarring. Electronically Signed   By: Titus Dubin M.D.   On: 04/09/2018 13:03    EKG: Orders placed or performed during the hospital encounter of 04/09/18  . ED EKG within 10 minutes  . ED EKG within 10 minutes  .  EKG 12-Lead  . EKG 12-Lead    IMPRESSION AND PLAN: Patient is 52 year old white male with history of end-stage renal disease presenting with worsening shortness of breath  1.  Shortness of breath due to likely CHF exasperation Obtain echocardiogram of the heart Patient does not make any urine, nephrology to hemodialysis him to remove fluid  2.  Accelerated hypertension Add IV hydralazine Continue Tenormin, Coreg, amlodipine   3.  End-stage renal disease as above nephrology consult for dialysis  4.  Anemia of chronic disease monitor hemoglobin  5.  Hyperlipidemia continue Zocor  6.  Sleep apnea  continue CPAP  7.  Miscellaneous heparin for DVT prophylaxis  All the records are reviewed and case discussed with ED provider. Management plans discussed with the patient, family and they are in agreement.  CODE STATUS: Code Status History    Date Active Date Inactive Code Status Order ID Comments User Context   03/13/2018 0123 03/14/2018 1716 Full Code [id number]  Arta Silence, MD Inpatient   12/18/2016 0150 12/19/2016 2105 Full Code [id number]  Harvie Bridge, DO Inpatient   10/02/2016 1011 10/02/2016 1418 Full Code [id number]  Algernon Huxley, MD Inpatient   04/08/2016 1412 04/12/2016 1955 Full Code [id number]  Norman Herrlich, MD ED   12/14/2015 2223 12/16/2015 1423 Full Code [id number]  Demetrios Loll, MD Inpatient   11/03/2015 0211 11/04/2015 1739 Full Code [id number]  Lily Kocher, MD Inpatient   09/21/2015 1930 09/24/2015 1613 Full Code [id number]  Vianne Bulls, MD ED   02/14/2015 2341 02/17/2015 1814 Full Code [id number]  Lavina Hamman, MD ED       TOTAL TIME TAKING CARE OF THIS PATIENT: 55 minutes.    Dustin Flock M.D on 04/09/2018 at 3:37 PM  Between 7am to 6pm - Pager - [phone]  After 6pm go to [email or web address] - password [email or web address]  [employer] Office  [phone]  CC: Primary care physician; Tresa Garter, MD

## 2018-04-09 NOTE — ED Notes (Signed)
transporting pt to room 242

## 2018-04-09 NOTE — ED Provider Notes (Signed)
[facility] Emergency Department Provider Note  Time seen: 1:04 PM  I have reviewed the triage vital signs and the nursing notes.   HISTORY  Chief Complaint Shortness of Breath    HPI Jonathon Conley is a 52 y.o. male with a past medical history of asthma, CHF, end-stage renal disease on hemodialysis Monday, Wednesday, Friday, hypertension, hyperlipidemia, presents to the emergency department for shortness of breath.  According to the patient he has been going to his dialysis sessions as scheduled including yesterday.  States for the past 3 weeks he has had progressively worsening shortness of breath along with cough with clear sputum production.  Also states a sense of generalized fatigue and weakness over the past 3 weeks.  Patient denies any fever, chest pain or abdominal pain, does not make any urine.  Negative for congestion.  Tremor shortness of breath as moderate.   Past Medical History:  Diagnosis Date  . Anemia   . Asthma    childhood  . Blood transfusion without reported diagnosis   . CHF (congestive heart failure) ([place])   . Chronic kidney disease    Dialysis Mon, Wed, Fri  . Colitis   . Diverticulosis 04/12/16   also seen: sigmoid and rectal erythema, path:   . Hemorrhoids 03/2016   bleeding at 04/12/16 colonoscopy, treated with  APC laser. cauterization  . Hyperlipidemia   . Hypertension   . OSA (obstructive sleep apnea) 03/27/2013    Patient Active Problem List   Diagnosis Date Noted  . FUO (fever of unknown origin) 03/13/2018  . Viral respiratory infection 03/05/2018  . Cough 11/14/2017  . Lower respiratory infection 11/14/2017  . History of fever 11/14/2017  . SIRS (systemic inflammatory response syndrome) ([place]) 12/19/2016  . End stage renal disease ([place]) 12/19/2016  . Hyponatremia 12/19/2016  . Anemia of chronic disease 12/19/2016  . Postprocedural seroma of a musculoskeletal structure following other procedure 12/17/2016  .  Hematochezia 04/08/2016  . HTN (hypertension) 04/08/2016  . Intravenous line infection ([place]) 04/08/2016  . Insomnia 01/04/2016  . Malignant hypertension 12/14/2015  . Symptomatic anemia 11/03/2015  . Chronic anemia 11/03/2015  . Thrombocytopenia ([place]) 11/03/2015  . Pleural effusion on right 11/03/2015  . Shortness of breath 11/02/2015  . Sepsis ([place]) 09/23/2015  . ESRD on dialysis ([place]) 09/22/2015  . Accelerated hypertension 09/22/2015  . Leg edema, left   . HCAP (healthcare-associated pneumonia) 09/21/2015  . Bacteremia due to Gram-positive bacteria 08/20/2015  . Acute blood loss anemia 08/12/2015  . C. difficile colitis 08/12/2015  . Bleeding gastrointestinal 08/12/2015  . Acute-on-chronic renal failure ([place]) 08/10/2015  . H/O kidney transplant 08/08/2015  . Non compliance with medical treatment 08/08/2015  . History of anemia 08/08/2015  . AKI (acute kidney injury) ([place]) 02/14/2015  . Renal transplant recipient 02/14/2015  . Acute on chronic diastolic congestive heart failure ([place]) 02/14/2015  . Anemia 02/14/2015  . Hyperlipidemia   . OSA (obstructive sleep apnea) 03/27/2013    Past Surgical History:  Procedure Laterality Date  . APPLICATION OF WOUND VAC Right 12/13/2016   Procedure: APPLICATION OF WOUND VAC;  Surgeon: Algernon Huxley, MD;  Location: [facility] ORS;  Service: Vascular;  Laterality: Right;  . APPLICATION OF WOUND VAC Right 12/19/2016   Procedure: APPLICATION OF WOUND VAC;  Surgeon: Algernon Huxley, MD;  Location: [facility] ORS;  Service: Vascular;  Laterality: Right;  . COLONOSCOPY N/A 04/12/2016   Procedure: COLONOSCOPY;  Surgeon: Mauri Pole, MD;  Location: [facility];  Service:  Endoscopy;  Laterality: N/A;  . DIALYSIS/PERMA CATHETER INSERTION N/A 02/26/2017   Procedure: Dialysis/Perma Catheter Insertion;  Surgeon: Algernon Huxley, MD;  Location: [place] [facility];  Service: Cardiovascular;  Laterality: N/A;  . DIALYSIS/PERMA CATHETER INSERTION N/A 03/26/2018    Procedure: DIALYSIS/PERMA CATHETER INSERTION;  Surgeon: Katha Cabal, MD;  Location: [place] [facility];  Service: Cardiovascular;  Laterality: N/A;  . EXCHANGE OF A DIALYSIS CATHETER  11/20/2016   Procedure: Exchange Of A Dialysis Catheter;  Surgeon: Algernon Huxley, MD;  Location: [place] [facility];  Service: Cardiovascular;;  . fistulagrams Right   . HEMATOMA EVACUATION Right 12/13/2016   Procedure: EVACUATION HEMATOMA;  Surgeon: Algernon Huxley, MD;  Location: [facility] ORS;  Service: Vascular;  Laterality: Right;  . HEMATOMA EVACUATION Right 12/19/2016   Procedure: EVACUATIONof seroma;  Surgeon: Algernon Huxley, MD;  Location: [facility] ORS;  Service: Vascular;  Laterality: Right;  . HOT HEMOSTASIS N/A 04/12/2016   Procedure: HOT HEMOSTASIS (ARGON PLASMA COAGULATION/BICAP);  Surgeon: Mauri Pole, MD;  Location: [facility]COPY;  Service: Endoscopy;  Laterality: N/A;  . KIDNEY TRANSPLANT    . PERIPHERAL VASCULAR CATHETERIZATION Bilateral 10/02/2016   Procedure: Upper Extremity Venography;  Surgeon: Algernon Huxley, MD;  Location: [place] [facility];  Service: Cardiovascular;  Laterality: Bilateral;  . PERIPHERAL VASCULAR CATHETERIZATION Right 11/08/2016   Procedure: DIALYSIS/PERMA CATHETER REMOVAL right Jugular;  Surgeon: Algernon Huxley, MD;  Location: [facility] ORS;  Service: Vascular;  Laterality: Right;  . PERIPHERAL VASCULAR CATHETERIZATION Left 11/08/2016   Procedure: DIALYSIS/PERMA CATHETER INSERTION left jugular with u/s guide and flouroscan;  Surgeon: Algernon Huxley, MD;  Location: [facility] ORS;  Service: Vascular;  Laterality: Left;  . PERIPHERAL VASCULAR CATHETERIZATION N/A 11/08/2016   Procedure: IVC FILTER INSERTION;  Surgeon: Algernon Huxley, MD;  Location: [facility] ORS;  Service: Vascular;  Laterality: N/A;  . perm a cath in Rt upper chest Right   . THORACENTESIS    . VASCULAR ACCESS DEVICE INSERTION Right 11/08/2016   Procedure: INSERTION OF HERO VASCULAR ACCESS DEVICE ( GRAFT );  Surgeon: Algernon Huxley, MD;  Location:  [facility] ORS;  Service: Vascular;  Laterality: Right;    Prior to Admission medications   Medication Sig Start Date End Date Taking? Authorizing Provider  amLODipine (NORVASC) 10 MG tablet Take 10 mg by mouth daily.    [provider]  aspirin EC 81 MG tablet Take 81 mg by mouth daily.     [provider]  atenolol (TENORMIN) 50 MG tablet Take 50 mg by mouth daily. 11/21/17 11/21/18  [provider]  benzonatate (TESSALON) 100 MG capsule Take 100 mg by mouth 2 (two) times daily as needed for cough. 03/08/18   [provider]  calcitRIOL (ROCALTROL) 0.5 MCG capsule Take 1 capsule (0.5 mcg total) by mouth Every Tuesday,Thursday,and Saturday with dialysis. 09/24/15   Orson Eva, MD  calcium acetate (PHOSLO) 667 MG capsule Take 667 mg by mouth 3 (three) times daily with meals.    [provider]  carvedilol (COREG) 25 MG tablet Take 25 mg by mouth 2 (two) times daily.     [provider]  ferrous sulfate 325 (65 FE) MG tablet Take 325 mg by mouth 3 (three) times daily with meals. Reported on 04/09/2016    [provider]  HYDROcodone-acetaminophen (NORCO) 5-325 MG tablet Take 1-2 tablets by mouth every 6 (six) hours as needed. 03/26/18   Schnier, Dolores Lory, MD  Mesalamine 800 MG TBEC  Take 1 tablet (800 mg total) by mouth 3 (three) times daily. 05/03/16   Esterwood, Amy S, PA-C  simvastatin (ZOCOR) 5 MG tablet Take 1 tablet (5 mg total) by mouth at bedtime. 02/17/15   Ghimire, Henreitta Leber, MD  spironolactone (ALDACTONE) 25 MG tablet Take 25 mg by mouth daily.     [provider]    Allergies  Allergen Reactions  . Lisinopril Swelling    Facial swelling    Family History  Problem Relation Age of Onset  . Heart disease Mother   . Cancer Mother        ovarian  . Cancer Maternal Grandmother   . Cancer Maternal Grandfather   . Asthma Son   . Asthma Son     Social History Social History   Tobacco Use  . Smoking status: Never Smoker   . Smokeless tobacco: Never Used  Substance Use Topics  . Alcohol use: No  . Drug use: No    Review of Systems Constitutional: Negative for fever. Eyes: Negative for visual complaints ENT: Negative for recent illness/congestion Cardiovascular: Negative for chest pain. Respiratory: Moderate shortness of breath, occasional cough with clear sputum production Gastrointestinal: Negative for abdominal pain, vomiting  Genitourinary: Negative for urinary compaints Musculoskeletal: Negative for musculoskeletal complaints Skin: Negative for skin complaints  Neurological: Negative for headache All other ROS negative  ____________________________________________   PHYSICAL EXAM:  VITAL SIGNS: ED Triage Vitals  Enc Vitals Group     BP 04/09/18 1234 (!) 196/105     Pulse Rate 04/09/18 1234 66     Resp 04/09/18 1234 18     Temp 04/09/18 1234 97.8 F (36.6 C)     Temp Source 04/09/18 1234 Oral     SpO2 04/09/18 1234 94 %     Weight 04/09/18 1234 185 lb (83.9 kg)     Height 04/09/18 1234 5' 10"  (1.778 m)     Head Circumference --      Peak Flow --      Pain Score 04/09/18 1235 0     Pain Loc --      Pain Edu? --      Excl. in [place]? --    Constitutional: Alert and oriented. Well appearing and in no distress. Eyes: Normal exam ENT   Head: Normocephalic and atraumatic.   Mouth/Throat: Mucous membranes are moist. Cardiovascular: Normal rate, regular rhythm. No murmur Respiratory: Normal respiratory effort without tachypnea nor retractions. Breath sounds are clear Gastrointestinal: Soft and nontender. No distention.   Musculoskeletal: Nontender with normal range of motion in all extremities Neurologic:  Normal speech and language. No gross focal neurologic deficits  Skin:  Skin is warm, dry and intact.  Psychiatric: Mood and affect are normal.   ____________________________________________    EKG  EKG reviewed and interpreted by myself shows normal sinus rhythm at 64 bpm  with a narrow QRS, normal axis, normal intervals, no concerning ST changes.  ____________________________________________    RADIOLOGY   IMPRESSION: 1. New mild pulmonary vascular congestion and trace left pleural effusion. 2. Unchanged right pleuroparenchymal scarring.  ____________________________________________   INITIAL IMPRESSION / ASSESSMENT AND PLAN / ED COURSE  Pertinent labs & imaging results that were available during my care of the patient were reviewed by me and considered in my medical decision making (see chart for details).  Patient presents to the emergency department for worsening shortness of breath over the past [redacted] weeks along with clear sputum production.  Differential would include pneumonia, pneumothorax, CHF/pulmonary  edema, upper respiratory infection, ACS.  We will check labs including cardiac enzymes, obtain a chest x-ray and continue to closely monitor.  Currently the patient appears well satting 94 to 95% on room air, no distress or discomfort.   Patient's work-up shows his hemoglobin is slightly decreased from baseline 8.0 from 99.5.  Patient's chest x-ray shows vascular congestion.  Patient currently satting between 90 to 94% on room air.  I discussed the patient with nephrology, after discussion we have agreed upon admission to the hospital for hemodialysis to remove fluid.  Patient agreeable to plan of care. ____________________________________________   FINAL CLINICAL IMPRESSION(S) / ED DIAGNOSES  Dyspnea Pulmonary vascular congestion Fluid overload   Harvest Dark, MD 04/09/18 1502

## 2018-04-09 NOTE — Progress Notes (Signed)
Pt wife will bring in home CPAP machine in tomorrow, refused our machine for tonight

## 2018-04-10 ENCOUNTER — Inpatient Hospital Stay: Payer: Medicare Other

## 2018-04-10 LAB — RENAL FUNCTION PANEL
Albumin: 3.3 g/dL — ABNORMAL LOW (ref 3.5–5.0)
Anion gap: 13 (ref 5–15)
BUN: 22 mg/dL — ABNORMAL HIGH (ref 6–20)
CALCIUM: 8.9 mg/dL (ref 8.9–10.3)
CO2: 27 mmol/L (ref 22–32)
CREATININE: 10.36 mg/dL — AB (ref 0.61–1.24)
Chloride: 97 mmol/L — ABNORMAL LOW (ref 98–111)
GFR calc Af Amer: 6 mL/min — ABNORMAL LOW (ref >60)
GFR, EST NON AFRICAN AMERICAN: 5 mL/min — AB (ref >60)
Glucose, Bld: 92 mg/dL (ref 70–99)
PHOSPHORUS: 4.5 mg/dL (ref 2.5–4.6)
Potassium: 3.8 mmol/L (ref 3.5–5.1)
SODIUM: 137 mmol/L (ref 135–145)

## 2018-04-10 LAB — TROPONIN I: Troponin I: <0.03 ng/mL (ref <0.03)

## 2018-04-10 LAB — CBC
HCT: 23.2 % — ABNORMAL LOW (ref 40.0–52.0)
Hemoglobin: 7.7 g/dL — ABNORMAL LOW (ref 13.0–18.0)
MCH: 28.7 pg (ref 26.0–34.0)
MCHC: 33.2 g/dL (ref 32.0–36.0)
MCV: 86.4 fL (ref 80.0–100.0)
PLATELETS: 68 10*3/uL — AB (ref 150–440)
RBC: 2.68 MIL/uL — AB (ref 4.40–5.90)
RDW: 19.3 % — ABNORMAL HIGH (ref 11.5–14.5)
WBC: 2.2 10*3/uL — AB (ref 3.8–10.6)

## 2018-04-10 MED ORDER — HYDRALAZINE HCL 25 MG PO TABS
25.0000 mg | ORAL_TABLET | Freq: Three times a day (TID) | ORAL | Status: DC
  Administered 2018-04-10 – 2018-04-11 (×3): 25 mg via ORAL
  Filled 2018-04-10 (×3): qty 1

## 2018-04-10 MED ORDER — EPOETIN ALFA 10000 UNIT/ML IJ SOLN
10000.0000 [IU] | INTRAMUSCULAR | Status: DC
  Administered 2018-04-10: 10000 [IU] via INTRAVENOUS
  Filled 2018-04-10: qty 1

## 2018-04-10 NOTE — Progress Notes (Signed)
Received call from ultrasound, the pt felt short of breath. Went down to assess pt. Lung sounds clear and SpO2 100% on RA. Pt appeared to be short winded, especially when placed in a flat position. Placed the pt on 2L of O2 for comfort. Pt stated he felt much better with the oxygen in place. O2 remains in place at this time will continue to monitor.

## 2018-04-10 NOTE — Progress Notes (Signed)
2nd attempt for report   04/10/18 1035  Hand-Off documentation  Report given to (Full Name) Stark Bray

## 2018-04-10 NOTE — Progress Notes (Signed)
Pt stated that his wife bringing  cpap , and no help with it

## 2018-04-10 NOTE — Progress Notes (Signed)
Post HD assessment. Pt tolerated tx well without complication.Pt c/o nausea during tx, MD aware. Net UF 3017, goal met.    04/10/18 1458  Vital Signs  Temp 98 F (36.7 C)  Temp Source Oral  Pulse Rate 64  Pulse Rate Source Monitor  Resp 16  BP (!) 163/130  BP Location Left Arm  BP Method Automatic  Patient Position (if appropriate) Lying  Oxygen Therapy  SpO2 100 %  O2 Device Nasal Cannula  O2 Flow Rate (L/min) 2.5 L/min  Dialysis Weight  Weight 82.7 kg (182 lb 5.1 oz)  Type of Weight Post-Dialysis  Post-Hemodialysis Assessment  Rinseback Volume (mL) 250 mL  KECN 76.8 V  Dialyzer Clearance Lightly streaked  Duration of HD Treatment -hour(s) 3.5 hour(s)  Hemodialysis Intake (mL) 500 mL  UF Total -Machine (mL) 3517 mL  Net UF (mL) 3017 mL  Tolerated HD Treatment Yes  Education / Care Plan  Dialysis Education Provided Yes  Documented Education in Care Plan Yes  Hemodialysis Catheter Left Subclavian Double-lumen  No Placement Date or Time found.   Site Prep: (c) Other (comment)  Orientation: Left  Access Location: Subclavian  Hemodialysis Catheter Type: Double-lumen  Site Condition No complications  Blue Lumen Status Heparin locked  Red Lumen Status Heparin locked  Purple Lumen Status N/A  Catheter fill solution Heparin 1000 units/ml  Catheter fill volume (Arterial) 2.1 cc  Catheter fill volume (Venous) 2.1  Dressing Type Biopatch  Dressing Status Dressing changed;Clean;Dry;Intact  Interventions New dressing  Drainage Description None  Dressing Change Due 04/17/18  Post treatment catheter status Capped and Clamped

## 2018-04-10 NOTE — Progress Notes (Signed)
Pre HD assessment    04/10/18 1107  Neurological  Level of Consciousness Alert  Orientation Level Oriented X4  Respiratory  Respiratory Pattern Regular;Unlabored  Chest Assessment Chest expansion symmetrical  Cardiac  ECG Monitor Yes  Cardiac Rhythm SB  Antiarrhythmic device No  Vascular  R Radial Pulse +2  L Radial Pulse +2  Edema Generalized;Right upper extremity  Integumentary  Integumentary (WDL) X  Skin Color Appropriate for ethnicity  Musculoskeletal  Musculoskeletal (WDL) X  Generalized Weakness Yes  Assistive Device None  GU Assessment  Genitourinary (WDL) X  Genitourinary Symptoms  (HD)  Psychosocial  Psychosocial (WDL) WDL

## 2018-04-10 NOTE — Progress Notes (Signed)
[facility]  ROUNDING NOTE   Subjective:   Seen and examined on hemodialysis. Tolerating treatment well. UF goal of 3 liters.   Left subclavian nonocclusive thrombosis    HEMODIALYSIS FLOWSHEET:  Blood Flow Rate (mL/min): 400 mL/min Arterial Pressure (mmHg): -210 mmHg Venous Pressure (mmHg): 160 mmHg Transmembrane Pressure (mmHg): 80 mmHg Ultrafiltration Rate (mL/min): 1000 mL/min Dialysate Flow Rate (mL/min): 600 ml/min Conductivity: Machine : 13.8 Conductivity: Machine : 13.8 Dialysis Fluid Bolus: Normal Saline Bolus Amount (mL): 250 mL    Objective:  Vital signs in last 24 hours:  Temp:  [97.8 F (36.6 C)-98.2 F (36.8 C)] 98 F (36.7 C) (06/26 1106) Pulse Rate:  [53-77] 53 (06/26 1130) Resp:  [11-20] 14 (06/26 1130) BP: (151-205)/(90-120) 161/105 (06/26 1130) SpO2:  [87 %-100 %] 100 % (06/26 1130) Weight:  [83 kg (183 lb)-85.2 kg (187 lb 13.3 oz)] 85.2 kg (187 lb 13.3 oz) (06/26 1106)  Weight change:  Filed Weights   04/09/18 1234 04/10/18 0405 04/10/18 1106  Weight: 83.9 kg (185 lb) 83 kg (183 lb) 85.2 kg (187 lb 13.3 oz)    Intake/Output: No intake/output data recorded.   Intake/Output this shift:  Total I/O In: 240 [P.O.:240] Out: -   Physical Exam: General: NAD,   Head: Normocephalic, atraumatic. Moist oral mucosal membranes  Eyes: Anicteric, PERRL  Neck: Supple, trachea midline  Lungs:  Clear to auscultation  Heart: Regular rate and rhythm  Abdomen:  Soft, nontender,   Extremities: Right arm + edema  Neurologic: Nonfocal, moving all four extremities  Skin: No lesions  Access: Left IJ permcath    Basic Metabolic Panel: Recent Labs  Lab 04/09/18 1344 04/10/18 0100  NA 137 137  K 3.7 3.8  CL 96* 97*  CO2 30 27  GLUCOSE 97 92  BUN 18 22*  CREATININE 9.26* 10.36*  CALCIUM 9.0 8.9  PHOS  --  4.5    Liver Function Tests: Recent Labs  Lab 04/10/18 0100  ALBUMIN 3.3*   No results for input(s): LIPASE, AMYLASE in the  last 168 hours. No results for input(s): AMMONIA in the last 168 hours.  CBC: Recent Labs  Lab 04/09/18 1344 04/10/18 0110  WBC 2.3* 2.2*  HGB 8.0* 7.7*  HCT 23.8* 23.2*  MCV 86.Jonathon 86.4  PLT 62* 68*    Cardiac Enzymes: Recent Labs  Lab 04/09/18 1344 04/09/18 1937 04/10/18 0110  TROPONINI <0.03 <0.03 <0.03    BNP: Invalid input(s): POCBNP  CBG: No results for input(s): GLUCAP in the last 168 hours.  Microbiology: Results for orders placed or performed during the hospital encounter of 03/12/18  Blood culture (routine x 2)     Status: None   Collection Time: 03/12/18 11:06 PM  Result Value Ref Range Status   Specimen Description BLOOD LEFT FORARM  Final   Special Requests   Final    BOTTLES DRAWN AEROBIC AND ANAEROBIC Blood Culture results may not be optimal due to an inadequate volume of blood received in culture bottles   Culture   Final    NO GROWTH 5 DAYS Performed at [facility], [address] [place],  [address]    Report Status 03/17/2018 FINAL  Final  Blood culture (routine x 2)     Status: None   Collection Time: 03/12/18 11:55 PM  Result Value Ref Range Status   Specimen Description BLOOD Blood Culture adequate volume  Final   Special Requests   Final    BOTTLES DRAWN AEROBIC AND ANAEROBIC  BLOOD RIGHT HAND   Culture   Final    NO GROWTH 6 DAYS Performed at [facility], [address] [place], [place] [address]    Report Status 03/19/2018 FINAL  Final  MRSA PCR Screening     Status: None   Collection Time: 03/13/18  Jonathon:32 AM  Result Value Ref Range Status   MRSA by PCR NEGATIVE NEGATIVE Final    Comment:        The GeneXpert MRSA Assay (FDA approved for NASAL specimens only), is one component of a comprehensive MRSA colonization surveillance program. It is not intended to diagnose MRSA infection nor to guide or monitor treatment for MRSA infections. Performed at [facility], [address]  [place], [place] [address]   Respiratory Panel by PCR     Status: None   Collection Time: 03/13/18  4:21 PM  Result Value Ref Range Status   Adenovirus NOT DETECTED NOT DETECTED Final   Coronavirus 229E NOT DETECTED NOT DETECTED Final   Coronavirus HKU1 NOT DETECTED NOT DETECTED Final   Coronavirus NL63 NOT DETECTED NOT DETECTED Final   Coronavirus OC43 NOT DETECTED NOT DETECTED Final   Metapneumovirus NOT DETECTED NOT DETECTED Final   Rhinovirus / Enterovirus NOT DETECTED NOT DETECTED Final   Influenza A NOT DETECTED NOT DETECTED Final   Influenza B NOT DETECTED NOT DETECTED Final   Parainfluenza Virus Jonathon NOT DETECTED NOT DETECTED Final   Parainfluenza Virus 2 NOT DETECTED NOT DETECTED Final   Parainfluenza Virus 3 NOT DETECTED NOT DETECTED Final   Parainfluenza Virus 4 NOT DETECTED NOT DETECTED Final   Respiratory Syncytial Virus NOT DETECTED NOT DETECTED Final   Bordetella pertussis NOT DETECTED NOT DETECTED Final   Chlamydophila pneumoniae NOT DETECTED NOT DETECTED Final   Mycoplasma pneumoniae NOT DETECTED NOT DETECTED Final    Comment: Performed at [facility], [address], [place], [place] [address]    Coagulation Studies: No results for input(s): LABPROT, INR in the last 72 hours.  Urinalysis: No results for input(s): COLORURINE, LABSPEC, PHURINE, GLUCOSEU, HGBUR, BILIRUBINUR, KETONESUR, PROTEINUR, UROBILINOGEN, NITRITE, LEUKOCYTESUR in the last 72 hours.  Invalid input(s): APPERANCEUR    Imaging: Dg Chest 2 View  Result Date: 04/09/2018 CLINICAL DATA:  Worsening shortness of breath. EXAM: CHEST - 2 VIEW COMPARISON:  Chest x-ray dated March 20, 2018. FINDINGS: Unchanged tunneled left internal jugular dialysis catheter with the tip in the proximal right atrium. Unchanged HERO graft in the right upper extremity terminating at the cavoatrial junction. The heart size and mediastinal contours are within normal limits. New mild pulmonary vascular congestion. Unchanged right  pleural thickening and basilar scarring. Trace left pleural effusion. No pneumothorax. No acute osseous abnormality. IMPRESSION: Jonathon. New mild pulmonary vascular congestion and trace left pleural effusion. 2. Unchanged right pleuroparenchymal scarring. Electronically Signed   By: Titus Dubin M.D.   On: 04/09/2018 13:03   US Venous Img Upper Uni Right  Result Date: 04/10/2018 CLINICAL DATA:  Right arm swelling for Jonathon day EXAM: RIGHT UPPER EXTREMITY VENOUS DOPPLER ULTRASOUND TECHNIQUE: Gray-scale sonography with graded compression, as well as color Doppler and duplex ultrasound were performed to evaluate the upper extremity deep venous system from the level of the subclavian vein and including the jugular, axillary, basilic, radial, ulnar and upper cephalic vein. Spectral Doppler was utilized to evaluate flow at rest and with distal augmentation maneuvers. COMPARISON:  None. FINDINGS: Contralateral Subclavian Vein: Hypoechoic nonocclusive thrombus in the left subclavian vein. Phasic flow preserved.  This may be chronic given the history of extensive hemodialysis access sites and interventions. Internal Jugular Vein: No evidence of thrombus. Normal compressibility, respiratory phasicity and response to augmentation. Subclavian Vein: No evidence of thrombus. Normal compressibility, respiratory phasicity and response to augmentation. Axillary Vein: No evidence of thrombus. Normal compressibility, respiratory phasicity and response to augmentation. Cephalic Vein: No evidence of thrombus. Normal compressibility, respiratory phasicity and response to augmentation. Basilic Vein: No evidence of thrombus. Normal compressibility, respiratory phasicity and response to augmentation. Brachial Veins: No evidence of thrombus. Normal compressibility, respiratory phasicity and response to augmentation. Radial Veins: No evidence of thrombus. Normal compressibility, respiratory phasicity and response to augmentation. Ulnar Veins: No  evidence of thrombus. Normal compressibility, respiratory phasicity and response to augmentation. Venous Reflux:  None visualized. Other Findings: Right upper arm hero dialysis access is occluded/thrombosed. IMPRESSION: No evidence of significant DVT within the right upper extremity. Right upper arm hero dialysis access is thrombosed LEFT subclavian nonocclusive thrombus noted, this may be chronic given the extensive hemodialysis access sites and interventions. Electronically Signed   By: Jerilynn Mages.  Shick M.D.   On: 04/10/2018 09:48     Medications:    . amLODipine  10 mg Oral Daily  . aspirin EC  81 mg Oral Daily  . calcium acetate  667 mg Oral TID WC  . carvedilol  25 mg Oral BID  . Chlorhexidine Gluconate Cloth  6 each Topical Q0600  . epoetin (EPOGEN/PROCRIT) injection  10,000 Units Intravenous Q M,W,F-HD  . ferrous sulfate  325 mg Oral TID WC  . heparin  5,000 Units Subcutaneous Q8H  . losartan  100 mg Oral Daily  . Mesalamine  800 mg Oral TID  . simvastatin  5 mg Oral QHS   acetaminophen **OR** acetaminophen, benzonatate, hydrALAZINE, HYDROcodone-acetaminophen, ondansetron **OR** ondansetron (ZOFRAN) IV  Assessment/ Plan:  Mr. BLESS BELSHE is a 52 y.o. black male with end-stage renal disease on hemodialysis, hemodialysis from 1997 -2001, then with decease renal transplant until October 2016.  Past medical history of hypertension, anemia, hyperlipidemia and angioedema with lisinopril.   [facility]/MWF  Jonathon.  ESRD on HD MWF. Seen and examined on hemodialysis. Tolerating treatment well. UF rate of Jonathon liter per hour: total of 3 liters.   2. Right upper extremity edema: dopplers with left subclavian nonocclusive thrombosis  3. Anemia with renal failure: hemoglobin 7.7. Has been missing doses of Mircera and IV iron.  - EPO with HD treatment.   4. Hypertension: urgent on admission.  - IV hydralazine PRN - discontinued atenolol and spironolactone - Continue carvedilol - Started new  medication: losartan - monitor closely since patient has history of angioedema with lisinopril.   5. Secondary Hyperparathyroidism:  - Discontinue calcitriol - Calcium acetate with meals.    Jonathon Conley: Jonathon Conley 6/26/201911:53 AM

## 2018-04-10 NOTE — Progress Notes (Signed)
HD tx start    04/10/18 1114  Vital Signs  Pulse Rate (!) 57  Pulse Rate Source Monitor  Resp 12  BP (!) 170/97  BP Location Left Arm  BP Method Automatic  Patient Position (if appropriate) Lying  Oxygen Therapy  SpO2 100 %  O2 Device Nasal Cannula  O2 Flow Rate (L/min) 2.5 L/min  During Hemodialysis Assessment  Blood Flow Rate (mL/min) 400 mL/min  Arterial Pressure (mmHg) -200 mmHg  Venous Pressure (mmHg) 150 mmHg  Transmembrane Pressure (mmHg) 80 mmHg  Ultrafiltration Rate (mL/min) 1000 mL/min  Dialysate Flow Rate (mL/min) 600 ml/min  Conductivity: Machine  13.8  HD Safety Checks Performed Yes  Dialysis Fluid Bolus Normal Saline  Bolus Amount (mL) 250 mL  Intra-Hemodialysis Comments Tx initiated  Hemodialysis Catheter Left Subclavian Double-lumen  No Placement Date or Time found.   Site Prep: (c) Other (comment)  Orientation: Left  Access Location: Subclavian  Hemodialysis Catheter Type: Double-lumen  Blue Lumen Status Infusing  Red Lumen Status Infusing

## 2018-04-10 NOTE — Progress Notes (Signed)
Pre HD assessment   04/10/18 1106  Vital Signs  Temp 98 F (36.7 C)  Temp Source Oral  Pulse Rate 62  Pulse Rate Source Monitor  Resp 13  BP (!) 151/92  BP Location Left Arm  BP Method Automatic  Patient Position (if appropriate) Lying  Oxygen Therapy  SpO2 (!) 87 %  O2 Device Nasal Cannula  O2 Flow Rate (L/min) 2 L/min  Pain Assessment  Pain Scale 0-10  Pain Score 0  Dialysis Weight  Weight 85.2 kg (187 lb 13.3 oz)  Type of Weight Pre-Dialysis  Time-Out for Hemodialysis  What Procedure? HD  Pt Identifiers(min of two) First/Last Name;MRN/Account#  Correct Site? Yes  Correct Side? Yes  Correct Procedure? Yes  Consents Verified? Yes  Rad Studies Available? No  Safety Precautions Reviewed? Yes  Engineer, civil (consulting) Number  ([id number])  Station Number 2  UF/Alarm Test Passed  Conductivity: Meter 13.6  Conductivity: Machine  13.8  pH 7.6  Reverse Osmosis main  Normal Saline Lot Number [id number]  Dialyzer Lot Number [id number]  Disposable Set Lot Number [id number]  Machine Temperature 98.6 F (37 C)  Musician and Audible Yes  Blood Lines Intact and Secured Yes  Pre Treatment Patient Checks  Vascular access used during treatment Catheter  Hepatitis B Surface Antigen Results Negative  Date Hepatitis B Surface Antigen Drawn 02/20/18  Hepatitis B Surface Antibody  (>10)  Date Hepatitis B Surface Antibody Drawn 02/20/18  Hemodialysis Consent Verified Yes  Hemodialysis Standing Orders Initiated Yes  ECG (Telemetry) Monitor On Yes  Prime Ordered Normal Saline  Length of  DialysisTreatment -hour(s) 3.5 Hour(s)  Dialyzer Elisio 17H NR  Dialysate 2K, 2.5 Ca  Dialysis Anticoagulant None  Dialysate Flow Ordered 600  Blood Flow Rate Ordered 400 mL/min  Ultrafiltration Goal 3 Liters  Pre Treatment Labs Renal panel  Dialysis Blood Pressure Support Ordered Normal Saline  Education / Care Plan  Dialysis Education Provided Yes  Documented Education in Care Plan Yes   Hemodialysis Catheter Left Subclavian Double-lumen  No Placement Date or Time found.   Site Prep: (c) Other (comment)  Orientation: Left  Access Location: Subclavian  Hemodialysis Catheter Type: Double-lumen  Site Condition No complications  Blue Lumen Status Heparin locked  Red Lumen Status Heparin locked  Purple Lumen Status N/A  Dressing Type Gauze/Drain sponge  Dressing Status Clean;Dry;Intact  Drainage Description None

## 2018-04-10 NOTE — Progress Notes (Signed)
Pt c/o nausea, Richardson Landry, RN covering for [employer] while at lunch, aware that pt requested something to help with nausea.    04/10/18 1230  Vital Signs  Pulse Rate (!) 54  Pulse Rate Source Monitor  Resp 12  BP (!) 198/92  BP Location Left Arm  BP Method Automatic  Patient Position (if appropriate) Lying  Oxygen Therapy  SpO2 100 %  O2 Device Nasal Cannula  O2 Flow Rate (L/min) 2.5 L/min  During Hemodialysis Assessment  Blood Flow Rate (mL/min) 400 mL/min  Arterial Pressure (mmHg) -250 mmHg  Venous Pressure (mmHg) 160 mmHg  Transmembrane Pressure (mmHg) 80 mmHg  Ultrafiltration Rate (mL/min) 1000 mL/min  Dialysate Flow Rate (mL/min) 600 ml/min  Conductivity: Machine  13.8  HD Safety Checks Performed Yes  Intra-Hemodialysis Comments Progressing as prescribed (1290)

## 2018-04-10 NOTE — Progress Notes (Signed)
Pharmacy was sent a message to bring pt's medication to the dialysis unit, to be given during tx.    04/10/18 1048  Hand-Off documentation  Report received from (Full Name) Stark Bray

## 2018-04-10 NOTE — Progress Notes (Signed)
Post HD assessment    04/10/18 1458  Neurological  Level of Consciousness Alert  Orientation Level Oriented X4  Respiratory  Respiratory Pattern Regular;Unlabored  Chest Assessment Chest expansion symmetrical  Cardiac  ECG Monitor Yes  Antiarrhythmic device No  Vascular  R Radial Pulse +2  L Radial Pulse +2  Integumentary  Integumentary (WDL) X  Skin Color Appropriate for ethnicity  Musculoskeletal  Musculoskeletal (WDL) X  Generalized Weakness Yes  Assistive Device None  GU Assessment  Genitourinary (WDL) X  Genitourinary Symptoms  (HD)  Psychosocial  Psychosocial (WDL) WDL

## 2018-04-10 NOTE — Progress Notes (Signed)
HD tx end   04/10/18 1452  Vital Signs  Pulse Rate 66  Pulse Rate Source Monitor  Resp 17  BP (!) 169/117  BP Location Left Arm  BP Method Automatic  Patient Position (if appropriate) Lying  Oxygen Therapy  SpO2 100 %  O2 Device Nasal Cannula  O2 Flow Rate (L/min) 2.5 L/min  During Hemodialysis Assessment  Dialysis Fluid Bolus Normal Saline  Bolus Amount (mL) 250 mL  Intra-Hemodialysis Comments Tx completed

## 2018-04-10 NOTE — Progress Notes (Signed)
Ist attempt to get report for HD tx, nurse unavailable    04/10/18 1015  Hand-Off documentation  Report given to (Full Name) Stark Bray  Report received from (Full Name) Maretta Los

## 2018-04-10 NOTE — Progress Notes (Signed)
[facility] [place] NAME: Jonathon Conley    MR#:  [id number]  DATE OF BIRTH:  01/27/1966  SUBJECTIVE:  CHIEF COMPLAINT:   Chief Complaint  Patient presents with  . Shortness of Breath   Better shortness of breath and cough, oxygen 2 L. REVIEW OF SYSTEMS:  Review of Systems  Constitutional: Positive for malaise/fatigue. Negative for chills and fever.  HENT: Negative for sore throat.   Eyes: Negative for blurred vision and double vision.  Respiratory: Positive for cough and shortness of breath. Negative for hemoptysis, sputum production, wheezing and stridor.   Cardiovascular: Negative for chest pain, palpitations, orthopnea and leg swelling.  Gastrointestinal: Negative for abdominal pain, blood in stool, diarrhea, melena, nausea and vomiting.  Genitourinary: Negative for dysuria, flank pain and hematuria.  Musculoskeletal: Negative for back pain and joint pain.  Skin: Negative for rash.  Neurological: Negative for dizziness, sensory change, focal weakness, seizures, loss of consciousness, weakness and headaches.  Endo/Heme/Allergies: Negative for polydipsia.  Psychiatric/Behavioral: Negative for depression. The patient is not nervous/anxious.     DRUG ALLERGIES:   Allergies  Allergen Reactions  . Lisinopril Swelling    Facial swelling   VITALS:  Blood pressure (!) 181/114, pulse 64, temperature 98 F (36.7 C), temperature source Oral, resp. rate 15, height 5' 10"  (1.778 m), weight 182 lb 5.1 oz (82.7 kg), SpO2 100 %. PHYSICAL EXAMINATION:  Physical Exam  Constitutional: He is oriented to person, place, and time. He appears well-developed.  HENT:  Head: Normocephalic.  Mouth/Throat: Oropharynx is clear and moist.  Eyes: Pupils are equal, round, and reactive to light. Conjunctivae and EOM are normal. No scleral icterus.  Neck: Normal range of motion. Neck supple. No JVD present. No tracheal deviation present.  Cardiovascular:  Normal rate, regular rhythm and normal heart sounds. Exam reveals no gallop.  No murmur heard. Pulmonary/Chest: Effort normal and breath sounds normal. No respiratory distress. He has no wheezes. He has no rales.  Abdominal: Soft. Bowel sounds are normal. He exhibits no distension. There is no tenderness. There is no rebound.  Musculoskeletal: Normal range of motion. He exhibits no edema or tenderness.  Neurological: He is alert and oriented to person, place, and time. No cranial nerve deficit.  Skin: No rash noted. No erythema.  Psychiatric: He has a normal mood and affect.   LABORATORY PANEL:  Male CBC Recent Labs  Lab 04/10/18 0110  WBC 2.2*  HGB 7.7*  HCT 23.2*  PLT 68*   ------------------------------------------------------------------------------------------------------------------ Chemistries  Recent Labs  Lab 04/10/18 0100  NA 137  K 3.8  CL 97*  CO2 27  GLUCOSE 92  BUN 22*  CREATININE 10.36*  CALCIUM 8.9   RADIOLOGY:  US Venous Img Upper Uni Right  Result Date: 04/10/2018 CLINICAL DATA:  Right arm swelling for 1 day EXAM: RIGHT UPPER EXTREMITY VENOUS DOPPLER ULTRASOUND TECHNIQUE: Gray-scale sonography with graded compression, as well as color Doppler and duplex ultrasound were performed to evaluate the upper extremity deep venous system from the level of the subclavian vein and including the jugular, axillary, basilic, radial, ulnar and upper cephalic vein. Spectral Doppler was utilized to evaluate flow at rest and with distal augmentation maneuvers. COMPARISON:  None. FINDINGS: Contralateral Subclavian Vein: Hypoechoic nonocclusive thrombus in the left subclavian vein. Phasic flow preserved. This may be chronic given the history of extensive hemodialysis access sites and interventions. Internal Jugular Vein: No evidence of thrombus. Normal compressibility, respiratory phasicity and response to augmentation.  Subclavian Vein: No evidence of thrombus. Normal  compressibility, respiratory phasicity and response to augmentation. Axillary Vein: No evidence of thrombus. Normal compressibility, respiratory phasicity and response to augmentation. Cephalic Vein: No evidence of thrombus. Normal compressibility, respiratory phasicity and response to augmentation. Basilic Vein: No evidence of thrombus. Normal compressibility, respiratory phasicity and response to augmentation. Brachial Veins: No evidence of thrombus. Normal compressibility, respiratory phasicity and response to augmentation. Radial Veins: No evidence of thrombus. Normal compressibility, respiratory phasicity and response to augmentation. Ulnar Veins: No evidence of thrombus. Normal compressibility, respiratory phasicity and response to augmentation. Venous Reflux:  None visualized. Other Findings: Right upper arm hero dialysis access is occluded/thrombosed. IMPRESSION: No evidence of significant DVT within the right upper extremity. Right upper arm hero dialysis access is thrombosed LEFT subclavian nonocclusive thrombus noted, this may be chronic given the extensive hemodialysis access sites and interventions. Electronically Signed   By: Jerilynn Mages.  Shick M.D.   On: 04/10/2018 09:48   ASSESSMENT AND PLAN:   Patient is 52 year old white male with history of end-stage renal disease presenting with worsening shortness of breath  1.    Acute respiratory failure with hypoxia due to acute on chronic diastolic CHF. Follow-up echo. He got hemodialysis moving fluid.  Reevaluate hemodialysis tomorrow per Dr. Juleen China. Try to wean off oxygen.  2.  Accelerated hypertension Added IV hydralazine  per Dr. Juleen China,  discontinued atenolol and spironolactone - Continue carvedilol - Started new medication: losartan  3.  End-stage renal disease, continue hemodialysis.  4.  Anemia of chronic disease.  EPO with hemodialysis treatment.  5.  Hyperlipidemia continue Zocor  6.  Sleep apnea continue CPAP   All the  records are reviewed and case discussed with Care Management/Social Worker. Management plans discussed with the patient, family and they are in agreement.  CODE STATUS: Full Code  TOTAL TIME TAKING CARE OF THIS PATIENT: 33 minutes.   More than 50% of the time was spent in counseling/coordination of care: YES  POSSIBLE D/C IN 2 DAYS, DEPENDING ON CLINICAL CONDITION.   Demetrios Loll M.D on 04/10/2018 at 5:20 PM  Between 7am to 6pm - Pager - [phone]  After 6pm go to [email or web address] - [facility]

## 2018-04-11 LAB — ECHOCARDIOGRAM COMPLETE
HEIGHTINCHES: 70 in
WEIGHTICAEL: 2960 [oz_av]

## 2018-04-11 MED ORDER — LOSARTAN POTASSIUM 100 MG PO TABS: 100.0000 mg | ORAL_TABLET | Freq: Every day | ORAL | 0 refills | Status: DC

## 2018-04-11 MED ORDER — HYDRALAZINE HCL 25 MG PO TABS: 25.0000 mg | ORAL_TABLET | Freq: Three times a day (TID) | ORAL | 0 refills | Status: DC

## 2018-04-11 NOTE — Plan of Care (Signed)
  Problem: Education: Goal: Knowledge of General Education information will improve Outcome: Progressing   Problem: Health Behavior/Discharge Planning: Goal: Ability to manage health-related needs will improve Outcome: Progressing   Problem: Activity: Goal: Risk for activity intolerance will decrease Outcome: Progressing   Problem: Safety: Goal: Ability to remain free from injury will improve Outcome: Progressing   Problem: Activity: Goal: Capacity to carry out activities will improve Outcome: Progressing

## 2018-04-11 NOTE — Discharge Summary (Signed)
[facility] NAME: Jonathon Conley    MR#:  [id number]  DATE OF BIRTH:  1966-07-08  DATE OF ADMISSION:  04/09/2018   ADMITTING PHYSICIAN: Dustin Flock, MD  DATE OF DISCHARGE: 04/11/2018  PRIMARY CARE PHYSICIAN: Tresa Garter, MD   ADMISSION DIAGNOSIS:  Shortness of breath [R06.02] DISCHARGE DIAGNOSIS:  Active Problems:   Acute CHF (congestive heart failure) ([place])  SECONDARY DIAGNOSIS:   Past Medical History:  Diagnosis Date  . Anemia   . Asthma    childhood  . Blood transfusion without reported diagnosis   . CHF (congestive heart failure) ([place])   . Chronic kidney disease    Dialysis Mon, Wed, Fri  . Colitis   . Diverticulosis 04/12/16   also seen: sigmoid and rectal erythema, path:   . Hemorrhoids 03/2016   bleeding at 04/12/16 colonoscopy, treated with  APC laser. cauterization  . Hyperlipidemia   . Hypertension   . OSA (obstructive sleep apnea) 03/27/2013   HOSPITAL COURSE:  Patient is 52 year old white male with history of end-stage renal disease presenting with worsening shortness of breath  1.  Acute respiratory failure with hypoxia due to acute on chronic diastolic CHF. Follow-up echo: LV EF: 60% -   65%. He got hemodialysis moving fluid.  no need for hemodialysis today per Dr. Juleen China. Weaned off oxygen.  2.Accelerated hypertension Added IV hydralazine  per Dr. Juleen China,  discontinuedatenolol andspironolactone - Continue carvedilol - Startednew medication: losartan and added hydralazine 25 mg tid.  3.End-stage renal disease, continue hemodialysis.  4.Anemia of chronic disease.  EPO with hemodialysis treatment.  5.Hyperlipidemia continue Zocor  6.Sleep apnea continue CPAP  I discussed with Dr. Juleen China. DISCHARGE CONDITIONS:  Stable, discharge to home today. CONSULTS OBTAINED:  Treatment Team:  Lavonia Dana, MD DRUG ALLERGIES:   Allergies  Allergen Reactions  .  Lisinopril Swelling    Facial swelling   DISCHARGE MEDICATIONS:   Allergies as of 04/11/2018      Reactions   Lisinopril Swelling   Facial swelling      Medication List    STOP taking these medications   atenolol 50 MG tablet Commonly known as:  TENORMIN   spironolactone 25 MG tablet Commonly known as:  ALDACTONE     TAKE these medications   amLODipine 10 MG tablet Commonly known as:  NORVASC Take 10 mg by mouth daily.   aspirin EC 81 MG tablet Take 81 mg by mouth daily.   benzonatate 100 MG capsule Commonly known as:  TESSALON Take 100 mg by mouth 2 (two) times daily as needed for cough.   calcitRIOL 0.5 MCG capsule Commonly known as:  ROCALTROL Take 1 capsule (0.5 mcg total) by mouth Every Tuesday,Thursday,and Saturday with dialysis.   calcium acetate 667 MG capsule Commonly known as:  PHOSLO Take 667 mg by mouth 3 (three) times daily with meals.   carvedilol 25 MG tablet Commonly known as:  COREG Take 25 mg by mouth 2 (two) times daily.   ferrous sulfate 325 (65 FE) MG tablet Take 325 mg by mouth 3 (three) times daily with meals.   hydrALAZINE 25 MG tablet Commonly known as:  APRESOLINE Take 1 tablet (25 mg total) by mouth 3 (three) times daily.   HYDROcodone-acetaminophen 5-325 MG tablet Commonly known as:  NORCO Take 1-2 tablets by mouth every 6 (six) hours as needed.   losartan 100 MG tablet Commonly known as:  COZAAR Take 1 tablet (100 mg total) by  mouth daily.   Mesalamine 800 MG Tbec Take 1 tablet (800 mg total) by mouth 3 (three) times daily.   simvastatin 5 MG tablet Commonly known as:  ZOCOR Take 1 tablet (5 mg total) by mouth at bedtime.        DISCHARGE INSTRUCTIONS:  See AVS.  If you experience worsening of your admission symptoms, develop shortness of breath, life threatening emergency, suicidal or homicidal thoughts you must seek medical attention immediately by calling 911 or calling your MD immediately  if symptoms less  severe.  You Must read complete instructions/literature along with all the possible adverse reactions/side effects for all the Medicines you take and that have been prescribed to you. Take any new Medicines after you have completely understood and accpet all the possible adverse reactions/side effects.   Please note  You were cared for by a hospitalist during your hospital stay. If you have any questions about your discharge medications or the care you received while you were in the hospital after you are discharged, you can call the unit and asked to speak with the hospitalist on call if the hospitalist that took care of you is not available. Once you are discharged, your primary care physician will handle any further medical issues. Please note that NO REFILLS for any discharge medications will be authorized once you are discharged, as it is imperative that you return to your primary care physician (or establish a relationship with a primary care physician if you do not have one) for your aftercare needs so that they can reassess your need for medications and monitor your lab values.    On the day of Discharge:  VITAL SIGNS:  Blood pressure (!) 155/92, pulse 62, temperature (!) 97.5 F (36.4 C), temperature source Oral, resp. rate 14, height 5' 10"  (1.778 m), weight 175 lb 11.2 oz (79.7 kg), SpO2 100 %. PHYSICAL EXAMINATION:  GENERAL:  52 y.o.-year-old patient lying in the bed with no acute distress.  EYES: Pupils equal, round, reactive to light and accommodation. No scleral icterus. Extraocular muscles intact.  HEENT: Head atraumatic, normocephalic. Oropharynx and nasopharynx clear.  NECK:  Supple, no jugular venous distention. No thyroid enlargement, no tenderness.  LUNGS: Normal breath sounds bilaterally, no wheezing, rales,rhonchi or crepitation. No use of accessory muscles of respiration.  CARDIOVASCULAR: S1, S2 normal. No murmurs, rubs, or gallops.  ABDOMEN: Soft, non-tender,  non-distended. Bowel sounds present. No organomegaly or mass.  EXTREMITIES: No pedal edema, cyanosis, or clubbing.  NEUROLOGIC: Cranial nerves II through XII are intact. Muscle strength 5/5 in all extremities. Sensation intact. Gait not checked.  PSYCHIATRIC: The patient is alert and oriented x 3.  SKIN: No obvious rash, lesion, or ulcer.  DATA REVIEW:   CBC Recent Labs  Lab 04/10/18 0110  WBC 2.2*  HGB 7.7*  HCT 23.2*  PLT 68*    Chemistries  Recent Labs  Lab 04/10/18 0100  NA 137  K 3.8  CL 97*  CO2 27  GLUCOSE 92  BUN 22*  CREATININE 10.36*  CALCIUM 8.9     Microbiology Results  Results for orders placed or performed during the hospital encounter of 03/12/18  Blood culture (routine x 2)     Status: None   Collection Time: 03/12/18 11:06 PM  Result Value Ref Range Status   Specimen Description BLOOD LEFT FORARM  Final   Special Requests   Final    BOTTLES DRAWN AEROBIC AND ANAEROBIC Blood Culture results may not be optimal due to an  inadequate volume of blood received in culture bottles   Culture   Final    NO GROWTH 5 DAYS Performed at [facility], [address] [place], [place] [address]    Report Status 03/17/2018 FINAL  Final  Blood culture (routine x 2)     Status: None   Collection Time: 03/12/18 11:55 PM  Result Value Ref Range Status   Specimen Description BLOOD Blood Culture adequate volume  Final   Special Requests   Final    BOTTLES DRAWN AEROBIC AND ANAEROBIC BLOOD RIGHT HAND   Culture   Final    NO GROWTH 6 DAYS Performed at [facility], [address] [place], [place] [address]    Report Status 03/19/2018 FINAL  Final  MRSA PCR Screening     Status: None   Collection Time: 03/13/18  1:32 AM  Result Value Ref Range Status   MRSA by PCR NEGATIVE NEGATIVE Final    Comment:        The GeneXpert MRSA Assay (FDA approved for NASAL specimens only), is one component of a comprehensive MRSA colonization surveillance  program. It is not intended to diagnose MRSA infection nor to guide or monitor treatment for MRSA infections. Performed at [facility], [address] [place], [place] [address]   Respiratory Panel by PCR     Status: None   Collection Time: 03/13/18  4:21 PM  Result Value Ref Range Status   Adenovirus NOT DETECTED NOT DETECTED Final   Coronavirus 229E NOT DETECTED NOT DETECTED Final   Coronavirus HKU1 NOT DETECTED NOT DETECTED Final   Coronavirus NL63 NOT DETECTED NOT DETECTED Final   Coronavirus OC43 NOT DETECTED NOT DETECTED Final   Metapneumovirus NOT DETECTED NOT DETECTED Final   Rhinovirus / Enterovirus NOT DETECTED NOT DETECTED Final   Influenza A NOT DETECTED NOT DETECTED Final   Influenza B NOT DETECTED NOT DETECTED Final   Parainfluenza Virus 1 NOT DETECTED NOT DETECTED Final   Parainfluenza Virus 2 NOT DETECTED NOT DETECTED Final   Parainfluenza Virus 3 NOT DETECTED NOT DETECTED Final   Parainfluenza Virus 4 NOT DETECTED NOT DETECTED Final   Respiratory Syncytial Virus NOT DETECTED NOT DETECTED Final   Bordetella pertussis NOT DETECTED NOT DETECTED Final   Chlamydophila pneumoniae NOT DETECTED NOT DETECTED Final   Mycoplasma pneumoniae NOT DETECTED NOT DETECTED Final    Comment: Performed at [facility], [place] [address], [place], [place] [address]    RADIOLOGY:  No results found.   Management plans discussed with the patient, family and they are in agreement.  CODE STATUS: Full Code   TOTAL TIME TAKING CARE OF THIS PATIENT: 31 minutes.    Demetrios Loll M.D on 04/11/2018 at 9:31 AM  Between 7am to 6pm - Pager - [phone]  After 6pm go to [email or web address] - Proofreader  [facility]  Office  [phone]  CC: Primary care physician; Tresa Garter, MD   Note: This dictation was prepared with Dragon dictation along with smaller phrase technology. Any transcriptional errors that result from this process are  unintentional.

## 2018-04-11 NOTE — Progress Notes (Signed)
[facility]  ROUNDING NOTE   Subjective:   Hemodialysis treatment yesterday. Tolerated treatment well. UF of 3 liters.   Objective:  Vital signs in last 24 hours:  Temp:  [98 F (36.7 C)-98.4 F (36.9 C)] 98 F (36.7 C) (06/27 0522) Pulse Rate:  [53-79] 63 (06/27 0522) Resp:  [10-20] 17 (06/27 0522) BP: (151-198)/(92-130) 151/92 (06/27 0522) SpO2:  [87 %-100 %] 100 % (06/27 0522) Weight:  [79.7 kg (175 lb 11.2 oz)-85.2 kg (187 lb 13.3 oz)] 79.7 kg (175 lb 11.2 oz) (06/27 0522)  Weight change: 1.285 kg (2 lb 13.3 oz) Filed Weights   04/10/18 1106 04/10/18 1458 04/11/18 0522  Weight: 85.2 kg (187 lb 13.3 oz) 82.7 kg (182 lb 5.1 oz) 79.7 kg (175 lb 11.2 oz)    Intake/Output: I/O last 3 completed shifts: In: 240 [P.O.:240] Out: 3017 [Other:3017]   Intake/Output this shift:  No intake/output data recorded.  Physical Exam: General: NAD,   Head: Normocephalic, atraumatic. Moist oral mucosal membranes  Eyes: Anicteric, PERRL  Neck: Supple, trachea midline  Lungs:  Clear to auscultation  Heart: Regular rate and rhythm  Abdomen:  Soft, nontender,   Extremities: Right arm + edema  Neurologic: Nonfocal, moving all four extremities  Skin: No lesions  Access: Left IJ permcath    Basic Metabolic Panel: Recent Labs  Lab 04/09/18 1344 04/10/18 0100  NA 137 137  K 3.7 3.8  CL 96* 97*  CO2 30 27  GLUCOSE 97 92  BUN 18 22*  CREATININE 9.26* 10.36*  CALCIUM 9.0 8.9  PHOS  --  4.5    Liver Function Tests: Recent Labs  Lab 04/10/18 0100  ALBUMIN 3.3*   No results for input(s): LIPASE, AMYLASE in the last 168 hours. No results for input(s): AMMONIA in the last 168 hours.  CBC: Recent Labs  Lab 04/09/18 1344 04/10/18 0110  WBC 2.3* 2.2*  HGB 8.0* 7.7*  HCT 23.8* 23.2*  MCV 86.1 86.4  PLT 62* 68*    Cardiac Enzymes: Recent Labs  Lab 04/09/18 1344 04/09/18 1937 04/10/18 0110  TROPONINI <0.03 <0.03 <0.03    BNP: Invalid input(s):  POCBNP  CBG: No results for input(s): GLUCAP in the last 168 hours.  Microbiology: Results for orders placed or performed during the hospital encounter of 03/12/18  Blood culture (routine x 2)     Status: None   Collection Time: 03/12/18 11:06 PM  Result Value Ref Range Status   Specimen Description BLOOD LEFT FORARM  Final   Special Requests   Final    BOTTLES DRAWN AEROBIC AND ANAEROBIC Blood Culture results Jonathon not be optimal due to an inadequate volume of blood received in culture bottles   Culture   Final    NO GROWTH 5 DAYS Performed at [facility], [address], [place], [place] [address]    Report Status 03/17/2018 FINAL  Final  Blood culture (routine x 2)     Status: None   Collection Time: 03/12/18 11:55 PM  Result Value Ref Range Status   Specimen Description BLOOD Blood Culture adequate volume  Final   Special Requests   Final    BOTTLES DRAWN AEROBIC AND ANAEROBIC BLOOD RIGHT HAND   Culture   Final    NO GROWTH 6 DAYS Performed at [facility], [address] [place], [place] [address]    Report Status 03/19/2018 FINAL  Final  MRSA PCR Screening     Status: None   Collection Time: 03/13/18  1:32 AM  Result Value Ref Range Status   MRSA by PCR NEGATIVE NEGATIVE Final    Comment:        The GeneXpert MRSA Assay (FDA approved for NASAL specimens only), is one component of a comprehensive MRSA colonization surveillance program. It is not intended to diagnose MRSA infection nor to guide or monitor treatment for MRSA infections. Performed at [facility], [address] [place], [place] [address]   Respiratory Panel by PCR     Status: None   Collection Time: 03/13/18  4:21 PM  Result Value Ref Range Status   Adenovirus NOT DETECTED NOT DETECTED Final   Coronavirus 229E NOT DETECTED NOT DETECTED Final   Coronavirus HKU1 NOT DETECTED NOT DETECTED Final   Coronavirus NL63 NOT DETECTED NOT DETECTED Final   Coronavirus OC43 NOT  DETECTED NOT DETECTED Final   Metapneumovirus NOT DETECTED NOT DETECTED Final   Rhinovirus / Enterovirus NOT DETECTED NOT DETECTED Final   Influenza A NOT DETECTED NOT DETECTED Final   Influenza B NOT DETECTED NOT DETECTED Final   Parainfluenza Virus 1 NOT DETECTED NOT DETECTED Final   Parainfluenza Virus 2 NOT DETECTED NOT DETECTED Final   Parainfluenza Virus 3 NOT DETECTED NOT DETECTED Final   Parainfluenza Virus 4 NOT DETECTED NOT DETECTED Final   Respiratory Syncytial Virus NOT DETECTED NOT DETECTED Final   Bordetella pertussis NOT DETECTED NOT DETECTED Final   Chlamydophila pneumoniae NOT DETECTED NOT DETECTED Final   Mycoplasma pneumoniae NOT DETECTED NOT DETECTED Final    Comment: Performed at [facility], [place] [address], [place], [place] [address]    Coagulation Studies: No results for input(s): LABPROT, INR in the last 72 hours.  Urinalysis: No results for input(s): COLORURINE, LABSPEC, PHURINE, GLUCOSEU, HGBUR, BILIRUBINUR, KETONESUR, PROTEINUR, UROBILINOGEN, NITRITE, LEUKOCYTESUR in the last 72 hours.  Invalid input(s): APPERANCEUR    Imaging: Dg Chest 2 View  Result Date: 04/09/2018 CLINICAL DATA:  Worsening shortness of breath. EXAM: CHEST - 2 VIEW COMPARISON:  Chest x-ray dated March 20, 2018. FINDINGS: Unchanged tunneled left internal jugular dialysis catheter with the tip in the proximal right atrium. Unchanged HERO graft in the right upper extremity terminating at the cavoatrial junction. The heart size and mediastinal contours are within normal limits. New mild pulmonary vascular congestion. Unchanged right pleural thickening and basilar scarring. Trace left pleural effusion. No pneumothorax. No acute osseous abnormality. IMPRESSION: 1. New mild pulmonary vascular congestion and trace left pleural effusion. 2. Unchanged right pleuroparenchymal scarring. Electronically Signed   By: Titus Dubin M.D.   On: 04/09/2018 13:03   US Venous Img Upper Uni  Right  Result Date: 04/10/2018 CLINICAL DATA:  Right arm swelling for 1 day EXAM: RIGHT UPPER EXTREMITY VENOUS DOPPLER ULTRASOUND TECHNIQUE: Gray-scale sonography with graded compression, as well as color Doppler and duplex ultrasound were performed to evaluate the upper extremity deep venous system from the level of the subclavian vein and including the jugular, axillary, basilic, radial, ulnar and upper cephalic vein. Spectral Doppler was utilized to evaluate flow at rest and with distal augmentation maneuvers. COMPARISON:  None. FINDINGS: Contralateral Subclavian Vein: Hypoechoic nonocclusive thrombus in the left subclavian vein. Phasic flow preserved. This Jonathon be chronic given the history of extensive hemodialysis access sites and interventions. Internal Jugular Vein: No evidence of thrombus. Normal compressibility, respiratory phasicity and response to augmentation. Subclavian Vein: No evidence of thrombus. Normal compressibility, respiratory phasicity and response to augmentation. Axillary Vein: No evidence of thrombus. Normal compressibility, respiratory phasicity  and response to augmentation. Cephalic Vein: No evidence of thrombus. Normal compressibility, respiratory phasicity and response to augmentation. Basilic Vein: No evidence of thrombus. Normal compressibility, respiratory phasicity and response to augmentation. Brachial Veins: No evidence of thrombus. Normal compressibility, respiratory phasicity and response to augmentation. Radial Veins: No evidence of thrombus. Normal compressibility, respiratory phasicity and response to augmentation. Ulnar Veins: No evidence of thrombus. Normal compressibility, respiratory phasicity and response to augmentation. Venous Reflux:  None visualized. Other Findings: Right upper arm hero dialysis access is occluded/thrombosed. IMPRESSION: No evidence of significant DVT within the right upper extremity. Right upper arm hero dialysis access is thrombosed LEFT  subclavian nonocclusive thrombus noted, this Jonathon be chronic given the extensive hemodialysis access sites and interventions. Electronically Signed   By: Jerilynn Mages.  Shick M.D.   On: 04/10/2018 09:48     Medications:    . amLODipine  10 mg Oral Daily  . aspirin EC  81 mg Oral Daily  . calcium acetate  667 mg Oral TID WC  . carvedilol  25 mg Oral BID  . Chlorhexidine Gluconate Cloth  6 each Topical Q0600  . epoetin (EPOGEN/PROCRIT) injection  10,000 Units Intravenous Q M,W,F-HD  . ferrous sulfate  325 mg Oral TID WC  . heparin  5,000 Units Subcutaneous Q8H  . hydrALAZINE  25 mg Oral Q8H  . losartan  100 mg Oral Daily  . Mesalamine  800 mg Oral TID  . simvastatin  5 mg Oral QHS   acetaminophen **OR** acetaminophen, benzonatate, hydrALAZINE, HYDROcodone-acetaminophen, ondansetron **OR** ondansetron (ZOFRAN) IV  Assessment/ Plan:  [name] is a 52 y.o. black male with end-stage renal disease on hemodialysis, hemodialysis from 1997 -2001, then with decease renal transplant until October 2016.  Past medical history of hypertension, anemia, hyperlipidemia and angioedema with lisinopril.   [facility]/MWF  1.  ESRD on HD MWF. Tolerated hemodialysis treatment yesterday with UF of 3 liters.  - Change outpatient estimated dry weight to 79kg.   2. Right upper extremity edema: dopplers with left subclavian nonocclusive thrombosis  3. Anemia with renal failure: hemoglobin 7.7. Has been missing doses of Mircera and IV iron.  - EPO with HD treatment.   4. Hypertension: urgent on admission.  - IV hydralazine PRN - discontinued atenolol and spironolactone this admission - Continue carvedilol - Started new medications: losartan and hydralazine  5. Secondary Hyperparathyroidism:  - Calcium acetate with meals.   Discussed case with outpatient nephrologist, Dr. Smith Mince.    LOS: 2 Jonathon Conley 6/27/20199:26 AM

## 2018-04-11 NOTE — Care Management (Signed)
Notified Elvera Bicker with [facility] [employer] of discharge.

## 2018-04-11 NOTE — Discharge Instructions (Signed)
Renal diet

## 2018-04-11 NOTE — Progress Notes (Signed)
Prescriptions faxed to [facility] in [place].  Paper copies given to patient.  His wife will be here shortly.  Discharged to home.  Dr. Holley Raring reviewed plans to improve how he feels with him this morning.

## 2018-04-14 DIAGNOSIS — N186 End stage renal disease: Secondary | ICD-10-CM | POA: Diagnosis not present

## 2018-04-14 DIAGNOSIS — Z992 Dependence on renal dialysis: Secondary | ICD-10-CM | POA: Diagnosis not present

## 2018-04-14 DIAGNOSIS — T8610 Unspecified complication of kidney transplant: Secondary | ICD-10-CM | POA: Diagnosis not present

## 2018-04-15 DIAGNOSIS — N2581 Secondary hyperparathyroidism of renal origin: Secondary | ICD-10-CM | POA: Diagnosis not present

## 2018-04-15 DIAGNOSIS — D631 Anemia in chronic kidney disease: Secondary | ICD-10-CM | POA: Diagnosis not present

## 2018-04-15 DIAGNOSIS — N186 End stage renal disease: Secondary | ICD-10-CM | POA: Diagnosis not present

## 2018-04-16 ENCOUNTER — Ambulatory Visit (INDEPENDENT_AMBULATORY_CARE_PROVIDER_SITE_OTHER): Payer: Medicare Other | Admitting: Vascular Surgery

## 2018-04-16 ENCOUNTER — Encounter (INDEPENDENT_AMBULATORY_CARE_PROVIDER_SITE_OTHER): Payer: Self-pay

## 2018-04-16 ENCOUNTER — Encounter (INDEPENDENT_AMBULATORY_CARE_PROVIDER_SITE_OTHER): Payer: Self-pay | Admitting: Vascular Surgery

## 2018-04-16 VITALS — BP 176/104 | HR 70 | Resp 17 | Ht 70.0 in | Wt 176.6 lb

## 2018-04-16 DIAGNOSIS — E785 Hyperlipidemia, unspecified: Secondary | ICD-10-CM | POA: Diagnosis not present

## 2018-04-16 DIAGNOSIS — N186 End stage renal disease: Secondary | ICD-10-CM

## 2018-04-16 DIAGNOSIS — I10 Essential (primary) hypertension: Secondary | ICD-10-CM | POA: Diagnosis not present

## 2018-04-16 DIAGNOSIS — Z992 Dependence on renal dialysis: Secondary | ICD-10-CM | POA: Diagnosis not present

## 2018-04-16 NOTE — Assessment & Plan Note (Signed)
This is clearly a cause of his renal failure and blood pressure control important in reducing the progression of atherosclerotic disease. On appropriate oral medications.

## 2018-04-16 NOTE — Assessment & Plan Note (Signed)
The patient has a very difficult dialysis access situation.  I think it would be reasonable to try to open his right arm hero graft.  He has already had several failed access options.  He has been catheter dependent for quite some time.  We discussed percutaneous thrombectomy versus open surgical thrombectomy.  Given the long-standing nature of the occlusion, I think a surgical thrombectomy with replacement of his central venous portion would be the safest option and the lowest risk of pulmonary embolus.  I discussed the risks and benefits of the procedure.  I discussed it could still thrombosed and we may have to look for new access options following this.  He and his wife are agreeable to proceed.

## 2018-04-16 NOTE — Assessment & Plan Note (Signed)
lipid control important in reducing the progression of atherosclerotic disease. Continue statin therapy  

## 2018-04-16 NOTE — Patient Instructions (Signed)
Vascular Access for Hemodialysis A vascular access is a connection between two blood vessels that allows blood to be easily removed from the body and returned to the body during hemodialysis. Hemodialysis is a procedure in which a machine outside of the body filters the blood. There are three types of vascular accesses:  Arteriovenous fistula. This is a connection between an artery and a vein (usually in the arm) that is made by sewing them together. Blood in the artery flows directly into the vein, causing it to get larger over time. This makes it easier for the vein to be used for hemodialysis. An arteriovenous fistula takes 1-6 months to develop after surgery.  Arteriovenous graft. This is a connection between an artery and a vein in the arm that is made with a tube. An arteriovenous graft can be used within 2-3 weeks of surgery.  Venous catheter. This is a thin, flexible tube that is placed in a large vein (usually in the neck, chest, or groin). A venous catheter for hemodialysis contains two tubes that come out of the skin. A venous catheter can be used right away. It is usually used as a temporary access if you need hemodialysis before a fistula or graft has developed. It may also be used as a permanent access if a fistula or graft cannot be created.  Which type of access is best for me? The type of access that is best for you depends on the size and strength of your veins. A fistula is usually the preferred type of access. It can last several years and is less likely than the other types of accesses to become infected or to cause blood clots within a blood vessel (thrombosis). However, a fistula is not an option for everyone. If your veins are not the right size, a graft may be used instead. Grafts require you to have strong veins. If your veins are not strong enough for a graft, a catheter may be used. Catheters are more likely than fistulas and grafts to become infected or to have  thrombosis. Sometimes, only one type of access is an option. Your health care provider will help you determine which type of access is best for you. How is a vascular access used? The way the access is used depends on the type of access:  If the access is a fistula or graft, two needles are inserted through the skin into the access before each hemodialysis session. Blood leaves the body through one of the needles and travels through a tube to the hemodialysis machine (dialyzer). It then flows through another tube and returns to the body through the second needle.  If the access is a catheter, one tube is connected directly to the tube that leads to the dialyzer and the other is connected to a tube that leads away from the dialyzer. Blood leaves the body through one tube and returns to the body through the other.  What kind of problems can occur with vascular accesses?  Blood clots within a blood vessel (thrombosis). Thrombosis can lead to a narrowing of a blood vessel or tube (stenosis). If thrombosis occurs frequently, another access site may be created as a backup.  Infection. These problems are most likely to occur with a venous catheter and least likely to occur with an arteriovenous fistula. How do I care for my vascular access? Wear a medical alert bracelet. This tells health care providers that you are a dialysis patient in the case of an emergency and   allows them to care for your veins appropriately. If you have a graft or fistula:  A "bruit" is a noise that is heard with a stethoscope and a "thrill" is a vibration felt over the graft or fistula. The presence of the bruit and thrill indicates that the access is working. You will be taught to feel for the thrill each day. If this is not felt, the access may be clotted. Call your health care provider.  You may use the arm where your vascular access is located freely after the site heals. Keep the following in mind: ? Avoid pressure on the  arm. ? Avoid lifting heavy objects with the arm. ? Avoid sleeping on the arm. ? Avoid wearing tight-sleeved shirts or jewelry around the graft or fistula.  Do not allow blood pressure monitoring or needle punctures on the side where the graft or fistula is located.  With permission from your health care provider, you may do exercises to help with blood flow through a fistula. These exercises involve squeezing a rubber ball or other soft objects as instructed.  Contact a health care provider if:  Chills develop.  You have an oral temperature above 102 F (38.9 C).  Swelling around the graft or fistula gets worse.  New pain develops.  Pus or other fluid (drainage) is seen at the vascular access site.  Skin redness or red streaking is seen on the skin around, above, or below the vascular access. Get help right away if:  Pain, numbness, or an unusual pale skin color develops in the hand on the side of your fistula.  Dizziness or weakness develops that you have not had before.  The vascular access has bleeding that cannot be easily controlled. This information is not intended to replace advice given to you by your health care provider. Make sure you discuss any questions you have with your health care provider. Document Released: 12/23/2002 Document Revised: 03/09/2016 Document Reviewed: 02/18/2013 [employer] Interactive Patient Education  2017 [employer]  

## 2018-04-16 NOTE — Progress Notes (Signed)
MRN : [id number]  Jonathon Conley is a 52 y.o. (22-Feb-1966) male who presents with chief complaint of  Chief Complaint  Patient presents with  . Follow-up    [facility] 1-2week  .  History of Present Illness: Patient returns today in follow up of dialysis access.  He has a right arm hero graft which has not been used in about a year.  He is currently using a left jugular PermCath which was recently evaluated and cared for by my partner.  This is currently working reasonably well.  It was discussed with the patient we may be able to revise or restore patency to his hero graft and he is interested in having that performed.  He has no fever or chills.  He had long-term arm swelling with seroma after the initial hero graft placement, but only one time has the arm swollen since then.  This went away after only one day.  He has had many access placements in that right arm.  Current Outpatient Medications  Medication Sig Dispense Refill  . amLODipine (NORVASC) 10 MG tablet Take 10 mg by mouth daily.    Marland Kitchen aspirin EC 81 MG tablet Take 81 mg by mouth daily.     . benzonatate (TESSALON) 100 MG capsule Take 100 mg by mouth 2 (two) times daily as needed for cough.  1  . calcitRIOL (ROCALTROL) 0.5 MCG capsule Take 1 capsule (0.5 mcg total) by mouth Every Tuesday,Thursday,and Saturday with dialysis. 15 capsule 0  . calcium acetate (PHOSLO) 667 MG capsule Take 667 mg by mouth 3 (three) times daily with meals.    . carvedilol (COREG) 25 MG tablet Take 25 mg by mouth 2 (two) times daily.   0  . ferrous sulfate 325 (65 FE) MG tablet Take 325 mg by mouth 3 (three) times daily with meals.     . hydrALAZINE (APRESOLINE) 25 MG tablet Take 1 tablet (25 mg total) by mouth 3 (three) times daily. 90 tablet 0  . losartan (COZAAR) 100 MG tablet Take 1 tablet (100 mg total) by mouth daily. 30 tablet 0  . Mesalamine 800 MG TBEC Take 1 tablet (800 mg total) by mouth 3 (three) times daily. 90 tablet 6  . simvastatin (ZOCOR) 5 MG  tablet Take 1 tablet (5 mg total) by mouth at bedtime. 30 tablet 0  . HYDROcodone-acetaminophen (NORCO) 5-325 MG tablet Take 1-2 tablets by mouth every 6 (six) hours as needed. (Patient not taking: Reported on 04/16/2018) 30 tablet 0   No current facility-administered medications for this visit.     Past Medical History:  Diagnosis Date  . Anemia   . Asthma    childhood  . Blood transfusion without reported diagnosis   . CHF (congestive heart failure) ([place])   . Chronic kidney disease    Dialysis Mon, Wed, Fri  . Colitis   . Diverticulosis 04/12/16   also seen: sigmoid and rectal erythema, path:   . Hemorrhoids 03/2016   bleeding at 04/12/16 colonoscopy, treated with  APC laser. cauterization  . Hyperlipidemia   . Hypertension   . OSA (obstructive sleep apnea) 03/27/2013    Past Surgical History:  Procedure Laterality Date  . APPLICATION OF WOUND VAC Right 12/13/2016   Procedure: APPLICATION OF WOUND VAC;  Surgeon: Algernon Huxley, MD;  Location: [facility];  Service: Vascular;  Laterality: Right;  . APPLICATION OF WOUND VAC Right 12/19/2016   Procedure: APPLICATION OF WOUND VAC;  Surgeon: Algernon Huxley, MD;  Location: [facility];  Service: Vascular;  Laterality: Right;  . COLONOSCOPY N/A 04/12/2016   Procedure: COLONOSCOPY;  Surgeon: Mauri Pole, MD;  Location: [place] [facility]OSCOPY;  Service: Endoscopy;  Laterality: N/A;  . DIALYSIS/PERMA CATHETER INSERTION N/A 02/26/2017   Procedure: Dialysis/Perma Catheter Insertion;  Surgeon: Algernon Huxley, MD;  Location: [place] [facility];  Service: Cardiovascular;  Laterality: N/A;  . DIALYSIS/PERMA CATHETER INSERTION N/A 03/26/2018   Procedure: DIALYSIS/PERMA CATHETER INSERTION;  Surgeon: Katha Cabal, MD;  Location: [place] CV [facility];  Service: Cardiovascular;  Laterality: N/A;  . EXCHANGE OF A DIALYSIS CATHETER  11/20/2016   Procedure: Exchange Of A Dialysis Catheter;  Surgeon: Algernon Huxley, MD;  Location: [place] CV [facility];  Service:  Cardiovascular;;  . fistulagrams Right   . HEMATOMA EVACUATION Right 12/13/2016   Procedure: EVACUATION HEMATOMA;  Surgeon: Algernon Huxley, MD;  Location: [facility];  Service: Vascular;  Laterality: Right;  . HEMATOMA EVACUATION Right 12/19/2016   Procedure: EVACUATIONof seroma;  Surgeon: Algernon Huxley, MD;  Location: [facility];  Service: Vascular;  Laterality: Right;  . HOT HEMOSTASIS N/A 04/12/2016   Procedure: HOT HEMOSTASIS (ARGON PLASMA COAGULATION/BICAP);  Surgeon: Mauri Pole, MD;  Location: [facility] ENDOSCOPY;  Service: Endoscopy;  Laterality: N/A;  . KIDNEY TRANSPLANT    . PERIPHERAL VASCULAR CATHETERIZATION Bilateral 10/02/2016   Procedure: Upper Extremity Venography;  Surgeon: Algernon Huxley, MD;  Location: [place] CV [facility];  Service: Cardiovascular;  Laterality: Bilateral;  . PERIPHERAL VASCULAR CATHETERIZATION Right 11/08/2016   Procedure: DIALYSIS/PERMA CATHETER REMOVAL right Jugular;  Surgeon: Algernon Huxley, MD;  Location: [facility];  Service: Vascular;  Laterality: Right;  . PERIPHERAL VASCULAR CATHETERIZATION Left 11/08/2016   Procedure: DIALYSIS/PERMA CATHETER INSERTION left jugular with u/s guide and flouroscan;  Surgeon: Algernon Huxley, MD;  Location: [facility];  Service: Vascular;  Laterality: Left;  . PERIPHERAL VASCULAR CATHETERIZATION N/A 11/08/2016   Procedure: IVC FILTER INSERTION;  Surgeon: Algernon Huxley, MD;  Location: [facility];  Service: Vascular;  Laterality: N/A;  . perm a cath in Rt upper chest Right   . THORACENTESIS    . VASCULAR ACCESS DEVICE INSERTION Right 11/08/2016   Procedure: INSERTION OF HERO VASCULAR ACCESS DEVICE ( GRAFT );  Surgeon: Algernon Huxley, MD;  Location: [facility];  Service: Vascular;  Laterality: Right;    Social History Social History   Tobacco Use  . Smoking status: Never Smoker  . Smokeless tobacco: Never Used  Substance Use Topics  . Alcohol use: No  . Drug use: No    Family History Family History  Problem Relation Age of Onset  . Heart disease  Mother   . Cancer Mother        ovarian  . Cancer Maternal Grandmother   . Cancer Maternal Grandfather   . Asthma Son   . Asthma Son     Allergies  Allergen Reactions  . Lisinopril Swelling    Facial swelling     REVIEW OF SYSTEMS (Negative unless checked)  Constitutional: [] Weight loss  [] Fever  [] Chills Cardiac: [] Chest pain   [] Chest pressure   [] Palpitations   [] Shortness of breath when laying flat   [] Shortness of breath at rest   [x] Shortness of breath with exertion. Vascular:  [] Pain in legs with walking   [] Pain in legs at rest   [] Pain in legs when laying flat   [] Claudication   [] Pain in feet when walking  [] Pain in feet at rest  [] Pain  in feet when laying flat   [] History of DVT   [] Phlebitis   [] Swelling in legs   [] Varicose veins   [] Non-healing ulcers Pulmonary:   [] Uses home oxygen   [] Productive cough   [] Hemoptysis   [] Wheeze  [] COPD   [] Asthma Neurologic:  [] Dizziness  [] Blackouts   [] Seizures   [] History of stroke   [] History of TIA  [] Aphasia   [] Temporary blindness   [] Dysphagia   [] Weakness or numbness in arms   [] Weakness or numbness in legs Musculoskeletal:  [x] Arthritis   [] Joint swelling   [x] Joint pain   [] Low back pain Hematologic:  [] Easy bruising  [] Easy bleeding   [x] Hypercoagulable state   [x] Anemic   Gastrointestinal:  [] Blood in stool   [] Vomiting blood  [] Gastroesophageal reflux/heartburn   [] Abdominal pain Genitourinary:  [x] Chronic kidney disease   [] Difficult urination  [] Frequent urination  [] Burning with urination   [] Hematuria Skin:  [] Rashes   [] Ulcers   [] Wounds Psychological:  [] History of anxiety   []  History of major depression.  Physical Examination  BP (!) 176/104 (BP Location: Left Arm)   Pulse 70   Resp 17   Ht 5' 10"  (1.778 m)   Wt 176 lb 9.6 oz (80.1 kg)   BMI 25.34 kg/m  Gen:  WD/WN, NAD Head: [place]/AT, No temporalis wasting. Ear/Nose/Throat: Hearing grossly intact, nares w/o erythema or drainage Eyes: Conjunctiva clear.  Sclera non-icteric Neck: Supple.  Trachea midline Pulmonary:  Good air movement, no use of accessory muscles.  Cardiac: RRR, no JVD Vascular: Multiple failed access placements in the right upper extremity including the upper arm hero graft originating from the proximal brachial artery Vessel Right Left  Radial  1+ palpable Palpable                                    Musculoskeletal: M/S 5/5 throughout.  No deformity or atrophy.  Trace lower extremity edema. Neurologic: Sensation grossly intact in extremities.  Symmetrical.  Speech is fluent.  Psychiatric: Judgment intact, Mood & affect appropriate for pt's clinical situation. Dermatologic: No rashes or ulcers noted.  No cellulitis or open wounds.       Labs Recent Results (from the past 2160 hour(s))  Comprehensive metabolic panel     Status: Abnormal   Collection Time: 01/28/18  8:04 PM  Result Value Ref Range   Sodium 133 (L) 135 - 145 mmol/L   Potassium 4.3 3.5 - 5.1 mmol/L    Comment: HEMOLYSIS AT THIS LEVEL MAY AFFECT RESULT   Chloride 94 (L) 101 - 111 mmol/L   CO2 27 22 - 32 mmol/L   Glucose, Bld 99 65 - 99 mg/dL   BUN 17 6 - 20 mg/dL   Creatinine, Ser 8.22 (H) 0.61 - 1.24 mg/dL   Calcium 8.1 (L) 8.9 - 10.3 mg/dL   Total Protein 8.1 6.5 - 8.1 g/dL   Albumin 3.8 3.5 - 5.0 g/dL   AST 36 15 - 41 U/L    Comment: HEMOLYSIS AT THIS LEVEL MAY AFFECT RESULT   ALT 19 17 - 63 U/L   Alkaline Phosphatase 63 38 - 126 U/L   Total Bilirubin 1.1 0.3 - 1.2 mg/dL    Comment: HEMOLYSIS AT THIS LEVEL MAY AFFECT RESULT   GFR calc non Af Amer 7 (L) >60 mL/min   GFR calc Af Amer 8 (L) >60 mL/min    Comment: (NOTE) The eGFR has been calculated using  the CKD EPI equation. This calculation has not been validated in all clinical situations. eGFR's persistently <60 mL/min signify possible Chronic Kidney Disease.    Anion gap 12 5 - 15    Comment: Performed at [facility], [address] [place], [place] [address]    Blood Culture (routine x 2)     Status: None   Collection Time: 01/28/18  8:04 PM  Result Value Ref Range   Specimen Description BLOOD BLOOD RIGHT HAND    Special Requests Blood Culture adequate volume    Culture      NO GROWTH 5 DAYS Performed at [facility], [address] [place], [place] [address]    Report Status 02/02/2018 FINAL   Blood Culture (routine x 2)     Status: None   Collection Time: 01/28/18  8:04 PM  Result Value Ref Range   Specimen Description BLOOD LEFT ANTECUBITAL    Special Requests Blood Culture adequate volume    Culture      NO GROWTH 5 DAYS Performed at [facility], [address] [place], [place] [address]    Report Status 02/02/2018 FINAL   Lactic acid, plasma     Status: None   Collection Time: 01/28/18  8:04 PM  Result Value Ref Range   Lactic Acid, Venous 0.8 0.5 - 1.9 mmol/L    Comment: Performed at [facility], [address] [place], [place] [address]  Brain natriuretic peptide     Status: Abnormal   Collection Time: 01/28/18  8:04 PM  Result Value Ref Range   B Natriuretic Peptide 449.0 (H) 0.0 - 100.0 pg/mL    Comment: Performed at [facility], [address] [place], [place] [address]  Troponin I     Status: None   Collection Time: 01/28/18  8:04 PM  Result Value Ref Range   Troponin I <0.03 <0.03 ng/mL    Comment: Performed at [facility], [address] [place], [place] [address]  Influenza panel by PCR (type A & B)     Status: None   Collection Time: 01/28/18  8:30 PM  Result Value Ref Range   Influenza A By PCR NEGATIVE NEGATIVE   Influenza B By PCR NEGATIVE NEGATIVE    Comment: (NOTE) The Xpert Xpress Flu assay is intended as an aid in the diagnosis of  influenza and should not be used as a sole basis for treatment.  This  assay is FDA approved for nasopharyngeal swab specimens only. Nasal  washings and aspirates are unacceptable for Xpert Xpress Flu testing. Performed  at [facility], [address] [place], [place] [address]   CBC with Differential/Platelet     Status: Abnormal   Collection Time: 01/28/18  9:10 PM  Result Value Ref Range   WBC 3.2 (L) 3.8 - 10.6 K/uL   RBC 3.87 (L) 4.40 - 5.90 MIL/uL   Hemoglobin 11.4 (L) 13.0 - 18.0 g/dL   HCT 34.2 (L) 40.0 - 52.0 %   MCV 88.4 80.0 - 100.0 fL   MCH 29.6 26.0 - 34.0 pg   MCHC 33.5 32.0 - 36.0 g/dL   RDW 18.9 (H) 11.5 - 14.5 %   Platelets 153 150 - 440 K/uL   Neutrophils Relative % 81 %   Neutro Abs 2.6 1.4 - 6.5 K/uL   Lymphocytes Relative 7 %   Lymphs Abs 0.2 (L) 1.0 - 3.6 K/uL   Monocytes Relative 12 %   Monocytes Absolute 0.4  0.2 - 1.0 K/uL   Eosinophils Relative 0 %   Eosinophils Absolute 0.0 0 - 0.7 K/uL   Basophils Relative 0 %   Basophils Absolute 0.0 0 - 0.1 K/uL    Comment: Performed at [facility], [address] [place], [place] [address]  Comprehensive metabolic panel     Status: Abnormal   Collection Time: 03/01/18 11:59 AM  Result Value Ref Range   Glucose 92 65 - 99 mg/dL   BUN 9 6 - 24 mg/dL    Comment: **Verified by repeat analysis**   Creatinine, Ser 6.46 (HH) 0.76 - 1.27 mg/dL    Comment:                   Client Requested Flag **Verified by repeat analysis**    GFR calc non Af Amer 9 (L) >59 mL/min/1.73   GFR calc Af Amer 11 (L) >59 mL/min/1.73   BUN/Creatinine Ratio 1 (L) 9 - 20   Sodium 139 134 - 144 mmol/L   Potassium 3.8 3.5 - 5.2 mmol/L   Chloride 92 (L) 96 - 106 mmol/L   CO2 29 20 - 29 mmol/L   Calcium 9.7 8.7 - 10.2 mg/dL   Total Protein 9.1 (H) 6.0 - 8.5 g/dL   Albumin 4.8 3.5 - 5.5 g/dL   Globulin, Total 4.3 1.5 - 4.5 g/dL   Albumin/Globulin Ratio 1.1 (L) 1.2 - 2.2   Bilirubin Total 0.7 0.0 - 1.2 mg/dL   Alkaline Phosphatase 67 39 - 117 IU/L   AST 20 0 - 40 IU/L   ALT 11 0 - 44 IU/L  CBC     Status: Abnormal   Collection Time: 03/12/18 10:20 PM  Result Value Ref Range   WBC 5.8 3.8 - 10.6 K/uL   RBC 3.16 (L) 4.40 - 5.90  MIL/uL   Hemoglobin 9.5 (L) 13.0 - 18.0 g/dL   HCT 28.1 (L) 40.0 - 52.0 %   MCV 88.9 80.0 - 100.0 fL   MCH 30.2 26.0 - 34.0 pg   MCHC 34.0 32.0 - 36.0 g/dL   RDW 17.1 (H) 11.5 - 14.5 %   Platelets 94 (L) 150 - 440 K/uL    Comment: Performed at [facility], [address] [place], [place] [address]  Comprehensive metabolic panel     Status: Abnormal   Collection Time: 03/12/18 10:20 PM  Result Value Ref Range   Sodium 133 (L) 135 - 145 mmol/L   Potassium 3.6 3.5 - 5.1 mmol/L   Chloride 94 (L) 101 - 111 mmol/L   CO2 26 22 - 32 mmol/L   Glucose, Bld 106 (H) 65 - 99 mg/dL   BUN 24 (H) 6 - 20 mg/dL   Creatinine, Ser 10.87 (H) 0.61 - 1.24 mg/dL   Calcium 8.8 (L) 8.9 - 10.3 mg/dL   Total Protein 7.5 6.5 - 8.1 g/dL   Albumin 3.5 3.5 - 5.0 g/dL   AST 24 15 - 41 U/L   ALT 13 (L) 17 - 63 U/L   Alkaline Phosphatase 52 38 - 126 U/L   Total Bilirubin 1.4 (H) 0.3 - 1.2 mg/dL   GFR calc non Af Amer 5 (L) >60 mL/min   GFR calc Af Amer 6 (L) >60 mL/min    Comment: (NOTE) The eGFR has been calculated using the CKD EPI equation. This calculation has not been validated in all clinical situations. eGFR's persistently <60 mL/min signify possible Chronic Kidney Disease.    Anion gap 13 5 -  15    Comment: Performed at [facility], [address] [place], [place] [address]  Troponin I     Status: Abnormal   Collection Time: 03/12/18 10:20 PM  Result Value Ref Range   Troponin I 0.03 (HH) <0.03 ng/mL    Comment: CRITICAL RESULT CALLED TO, READ BACK BY AND VERIFIED WITH REBECCA UHORCHUK ON 03/12/18 AT 2328 JAG Performed at [facility], [address] [place], [place] [address]   Lactic acid, plasma     Status: None   Collection Time: 03/12/18 10:47 PM  Result Value Ref Range   Lactic Acid, Venous 0.9 0.5 - 1.9 mmol/L    Comment: Performed at [facility], [address] [place], [place] [address]  Blood culture (routine x 2)     Status: None    Collection Time: 03/12/18 11:06 PM  Result Value Ref Range   Specimen Description BLOOD LEFT FORARM    Special Requests      BOTTLES DRAWN AEROBIC AND ANAEROBIC Blood Culture results may not be optimal due to an inadequate volume of blood received in culture bottles   Culture      NO GROWTH 5 DAYS Performed at [facility], [address] [place], [place] [address]    Report Status 03/17/2018 FINAL   Blood culture (routine x 2)     Status: None   Collection Time: 03/12/18 11:55 PM  Result Value Ref Range   Specimen Description BLOOD Blood Culture adequate volume    Special Requests      BOTTLES DRAWN AEROBIC AND ANAEROBIC BLOOD RIGHT HAND   Culture      NO GROWTH 6 DAYS Performed at [facility], [address] [place], [place] [address]    Report Status 03/19/2018 FINAL   MRSA PCR Screening     Status: None   Collection Time: 03/13/18  1:32 AM  Result Value Ref Range   MRSA by PCR NEGATIVE NEGATIVE    Comment:        The GeneXpert MRSA Assay (FDA approved for NASAL specimens only), is one component of a comprehensive MRSA colonization surveillance program. It is not intended to diagnose MRSA infection nor to guide or monitor treatment for MRSA infections. Performed at [facility], [address] [place], [place] [address]   Brain natriuretic peptide     Status: Abnormal   Collection Time: 03/13/18  2:06 AM  Result Value Ref Range   B Natriuretic Peptide 1,392.0 (H) 0.0 - 100.0 pg/mL    Comment: Performed at [facility], [place], [place] [address]  Procalcitonin - Baseline     Status: None   Collection Time: 03/13/18  2:06 AM  Result Value Ref Range   Procalcitonin 6.86 ng/mL    Comment:        Interpretation: PCT > 2 ng/mL: Systemic infection (sepsis) is likely, unless other causes are known. (NOTE)       Sepsis PCT Algorithm           Lower Respiratory Tract                                       Infection PCT Algorithm    ----------------------------     ----------------------------         PCT < 0.25 ng/mL  PCT < 0.10 ng/mL         Strongly encourage             Strongly discourage   discontinuation of antibiotics    initiation of antibiotics    ----------------------------     -----------------------------       PCT 0.25 - 0.50 ng/mL            PCT 0.10 - 0.25 ng/mL               OR       >80% decrease in PCT            Discourage initiation of                                            antibiotics      Encourage discontinuation           of antibiotics    ----------------------------     -----------------------------         PCT >= 0.50 ng/mL              PCT 0.26 - 0.50 ng/mL               AND       <80% decrease in PCT              Encourage initiation of                                             antibiotics       Encourage continuation           of antibiotics    ----------------------------     -----------------------------        PCT >= 0.50 ng/mL                  PCT > 0.50 ng/mL               AND         increase in PCT                  Strongly encourage                                      initiation of antibiotics    Strongly encourage escalation           of antibiotics                                     -----------------------------                                           PCT <= 0.25 ng/mL                                                 OR                                        >  80% decrease in PCT                                     Discontinue / Do not initiate                                             antibiotics Performed at [facility], [address] [place], [place] [address]   HIV antibody (Routine Testing)     Status: None   Collection Time: 03/13/18  2:06 AM  Result Value Ref Range   HIV Screen 4th Generation wRfx Non Reactive Non Reactive    Comment: (NOTE) Performed At: [facility] [place], [place] [id number] Rush Farmer MD IO:[id number] Performed at [facility], [address] [place], [place] [address]   Respiratory Panel by PCR     Status: None   Collection Time: 03/13/18  4:21 PM  Result Value Ref Range   Adenovirus NOT DETECTED NOT DETECTED   Coronavirus 229E NOT DETECTED NOT DETECTED   Coronavirus HKU1 NOT DETECTED NOT DETECTED   Coronavirus NL63 NOT DETECTED NOT DETECTED   Coronavirus OC43 NOT DETECTED NOT DETECTED   Metapneumovirus NOT DETECTED NOT DETECTED   Rhinovirus / Enterovirus NOT DETECTED NOT DETECTED   Influenza A NOT DETECTED NOT DETECTED   Influenza B NOT DETECTED NOT DETECTED   Parainfluenza Virus 1 NOT DETECTED NOT DETECTED   Parainfluenza Virus 2 NOT DETECTED NOT DETECTED   Parainfluenza Virus 3 NOT DETECTED NOT DETECTED   Parainfluenza Virus 4 NOT DETECTED NOT DETECTED   Respiratory Syncytial Virus NOT DETECTED NOT DETECTED   Bordetella pertussis NOT DETECTED NOT DETECTED   Chlamydophila pneumoniae NOT DETECTED NOT DETECTED   Mycoplasma pneumoniae NOT DETECTED NOT DETECTED    Comment: Performed at [facility] [address] [place], [place] [address]  Renal function panel     Status: Abnormal   Collection Time: 03/13/18  6:35 PM  Result Value Ref Range   Sodium 134 (L) 135 - 145 mmol/L   Potassium 3.8 3.5 - 5.1 mmol/L   Chloride 92 (L) 101 - 111 mmol/L   CO2 29 22 - 32 mmol/L   Glucose, Bld 87 65 - 99 mg/dL   BUN 34 (H) 6 - 20 mg/dL   Creatinine, Ser 12.91 (H) 0.61 - 1.24 mg/dL   Calcium 8.6 (L) 8.9 - 10.3 mg/dL   Phosphorus 3.7 2.5 - 4.6 mg/dL   Albumin 3.1 (L) 3.5 - 5.0 g/dL   GFR calc non Af Amer 4 (L) >60 mL/min   GFR calc Af Amer 4 (L) >60 mL/min    Comment: (NOTE) The eGFR has been calculated using the CKD EPI equation. This calculation has not been validated in all clinical situations. eGFR's persistently <60 mL/min signify possible Chronic Kidney Disease.    Anion gap 13 5 - 15     Comment: Performed at [facility], [address] [place], [place] [address]  CBC     Status: Abnormal   Collection Time: 03/13/18  6:35 PM  Result Value Ref Range   WBC 4.1 3.8 - 10.6 K/uL   RBC 3.09 (L) 4.40 - 5.90 MIL/uL   Hemoglobin 9.4 (L) 13.0 - 18.0 g/dL   HCT 27.2 (L) 40.0 - 52.0 %  MCV 88.0 80.0 - 100.0 fL   MCH 30.4 26.0 - 34.0 pg   MCHC 34.5 32.0 - 36.0 g/dL   RDW 17.0 (H) 11.5 - 14.5 %   Platelets 77 (L) 150 - 440 K/uL    Comment: Performed at [facility], [address], [place] [address]  Procalcitonin     Status: None   Collection Time: 03/14/18  5:46 AM  Result Value Ref Range   Procalcitonin 9.33 ng/mL    Comment:        Interpretation: PCT > 2 ng/mL: Systemic infection (sepsis) is likely, unless other causes are known. (NOTE)       Sepsis PCT Algorithm           Lower Respiratory Tract                                      Infection PCT Algorithm    ----------------------------     ----------------------------         PCT < 0.25 ng/mL                PCT < 0.10 ng/mL         Strongly encourage             Strongly discourage   discontinuation of antibiotics    initiation of antibiotics    ----------------------------     -----------------------------       PCT 0.25 - 0.50 ng/mL            PCT 0.10 - 0.25 ng/mL               OR       >80% decrease in PCT            Discourage initiation of                                            antibiotics      Encourage discontinuation           of antibiotics    ----------------------------     -----------------------------         PCT >= 0.50 ng/mL              PCT 0.26 - 0.50 ng/mL               AND       <80% decrease in PCT              Encourage initiation of                                             antibiotics       Encourage continuation           of antibiotics    ----------------------------     -----------------------------        PCT >= 0.50 ng/mL                  PCT >  0.50 ng/mL               AND         increase in PCT  Strongly encourage                                      initiation of antibiotics    Strongly encourage escalation           of antibiotics                                     -----------------------------                                           PCT <= 0.25 ng/mL                                                 OR                                        > 80% decrease in PCT                                     Discontinue / Do not initiate                                             antibiotics Performed at [facility], [address] [place], [place] [address]   CBC     Status: Abnormal   Collection Time: 03/14/18  5:46 AM  Result Value Ref Range   WBC 3.0 (L) 3.8 - 10.6 K/uL   RBC 3.10 (L) 4.40 - 5.90 MIL/uL   Hemoglobin 9.0 (L) 13.0 - 18.0 g/dL   HCT 27.1 (L) 40.0 - 52.0 %   MCV 87.2 80.0 - 100.0 fL   MCH 29.0 26.0 - 34.0 pg   MCHC 33.2 32.0 - 36.0 g/dL   RDW 16.9 (H) 11.5 - 14.5 %   Platelets 80 (L) 150 - 440 K/uL    Comment: Performed at [facility], [address], [place], [place] [address]  Basic metabolic panel     Status: Abnormal   Collection Time: 03/14/18  5:46 AM  Result Value Ref Range   Sodium 137 135 - 145 mmol/L   Potassium 3.7 3.5 - 5.1 mmol/L   Chloride 100 (L) 101 - 111 mmol/L   CO2 27 22 - 32 mmol/L   Glucose, Bld 94 65 - 99 mg/dL   BUN 15 6 - 20 mg/dL   Creatinine, Ser 7.51 (H) 0.61 - 1.24 mg/dL   Calcium 8.3 (L) 8.9 - 10.3 mg/dL   GFR calc non Af Amer 7 (L) >60 mL/min   GFR calc Af Amer 9 (L) >60 mL/min    Comment: (NOTE) The eGFR has been calculated using the CKD EPI equation. This calculation has not been validated in all clinical situations. eGFR's persistently <60 mL/min signify possible Chronic Kidney Disease.  Anion gap 10 5 - 15    Comment: Performed at [facility], [address] [place], [place] [address]  Potassium [facility] vascular  lab only)     Status: None   Collection Time: 03/26/18  9:39 AM  Result Value Ref Range   Potassium [facility] vascular lab) 4.2 3.5 - 5.1    Comment: Performed at [facility], [address] [place], [place] [address]  Brain natriuretic peptide     Status: Abnormal   Collection Time: 04/09/18  1:41 PM  Result Value Ref Range   B Natriuretic Peptide 2,428.0 (H) 0.0 - 100.0 pg/mL    Comment: Performed at [facility], [address] [place], [place] [address]  Basic metabolic panel     Status: Abnormal   Collection Time: 04/09/18  1:44 PM  Result Value Ref Range   Sodium 137 135 - 145 mmol/L   Potassium 3.7 3.5 - 5.1 mmol/L   Chloride 96 (L) 98 - 111 mmol/L    Comment: Please note change in reference range.   CO2 30 22 - 32 mmol/L   Glucose, Bld 97 70 - 99 mg/dL    Comment: Please note change in reference range.   BUN 18 6 - 20 mg/dL    Comment: Please note change in reference range.   Creatinine, Ser 9.26 (H) 0.61 - 1.24 mg/dL   Calcium 9.0 8.9 - 10.3 mg/dL   GFR calc non Af Amer 6 (L) >60 mL/min   GFR calc Af Amer 7 (L) >60 mL/min    Comment: (NOTE) The eGFR has been calculated using the CKD EPI equation. This calculation has not been validated in all clinical situations. eGFR's persistently <60 mL/min signify possible Chronic Kidney Disease.    Anion gap 11 5 - 15    Comment: Performed at [facility], [address] [place], [place] [address]  CBC     Status: Abnormal   Collection Time: 04/09/18  1:44 PM  Result Value Ref Range   WBC 2.3 (L) 3.8 - 10.6 K/uL   RBC 2.76 (L) 4.40 - 5.90 MIL/uL   Hemoglobin 8.0 (L) 13.0 - 18.0 g/dL   HCT 23.8 (L) 40.0 - 52.0 %   MCV 86.1 80.0 - 100.0 fL   MCH 29.0 26.0 - 34.0 pg   MCHC 33.6 32.0 - 36.0 g/dL   RDW 19.2 (H) 11.5 - 14.5 %   Platelets 62 (L) 150 - 440 K/uL    Comment: Performed at [facility], [address] [place], [place] [address]  Troponin I     Status: None   Collection Time:  04/09/18  1:44 PM  Result Value Ref Range   Troponin I <0.03 <0.03 ng/mL    Comment: Performed at [facility], [address] [place], [place] [address]  ECHOCARDIOGRAM COMPLETE     Status: None   Collection Time: 04/09/18  7:21 PM  Result Value Ref Range   Weight 2,960 oz   Height 70 in   BP 173/108 mmHg  Troponin I     Status: None   Collection Time: 04/09/18  7:37 PM  Result Value Ref Range   Troponin I <0.03 <0.03 ng/mL    Comment: Performed at [facility], [address] [place], [place] [address]  Renal function panel     Status: Abnormal   Collection Time: 04/10/18  1:00 AM  Result Value Ref Range   Sodium 137 135 - 145 mmol/L  Potassium 3.8 3.5 - 5.1 mmol/L   Chloride 97 (L) 98 - 111 mmol/L    Comment: Please note change in reference range.   CO2 27 22 - 32 mmol/L   Glucose, Bld 92 70 - 99 mg/dL    Comment: Please note change in reference range.   BUN 22 (H) 6 - 20 mg/dL    Comment: Please note change in reference range.   Creatinine, Ser 10.36 (H) 0.61 - 1.24 mg/dL   Calcium 8.9 8.9 - 10.3 mg/dL   Phosphorus 4.5 2.5 - 4.6 mg/dL   Albumin 3.3 (L) 3.5 - 5.0 g/dL   GFR calc non Af Amer 5 (L) >60 mL/min   GFR calc Af Amer 6 (L) >60 mL/min    Comment: (NOTE) The eGFR has been calculated using the CKD EPI equation. This calculation has not been validated in all clinical situations. eGFR's persistently <60 mL/min signify possible Chronic Kidney Disease.    Anion gap 13 5 - 15    Comment: Performed at [facility], [address] [place], [place] [address]  Troponin I     Status: None   Collection Time: 04/10/18  1:10 AM  Result Value Ref Range   Troponin I <0.03 <0.03 ng/mL    Comment: Performed at [facility], [address] [place], [place] [address]  CBC     Status: Abnormal   Collection Time: 04/10/18  1:10 AM  Result Value Ref Range   WBC 2.2 (L) 3.8 - 10.6 K/uL   RBC 2.68 (L) 4.40 - 5.90 MIL/uL   Hemoglobin 7.7  (L) 13.0 - 18.0 g/dL   HCT 23.2 (L) 40.0 - 52.0 %   MCV 86.4 80.0 - 100.0 fL   MCH 28.7 26.0 - 34.0 pg   MCHC 33.2 32.0 - 36.0 g/dL   RDW 19.3 (H) 11.5 - 14.5 %   Platelets 68 (L) 150 - 440 K/uL    Comment: Performed at [facility], [address] [place], [place] [address]    Radiology Dg Chest 1 View  Result Date: 03/20/2018 CLINICAL DATA:  Assess catheter placement. EXAM: CHEST  1 VIEW COMPARISON:  CT 03/13/2018.  Radiographs 03/12/2018. FINDINGS: 0857 hour. Interval new left IJ central venous catheter terminates at the confluence of the brachiocephalic veins. Right IJ dialysis graft is unchanged at the level of the upper right atrium. The heart size and mediastinal contours are stable. There is stable pleural thickening on the right and right basilar scarring. The left lung is clear. There is no pneumothorax. IMPRESSION: Interval exchange of the left internal jugular catheter, new catheter terminating near the confluence of the brachiocephalic veins. Right IJ dialysis catheter and pleuroparenchymal scarring on the right are unchanged. No pneumothorax. Electronically Signed   By: Richardean Sale M.D.   On: 03/20/2018 09:43   Dg Chest 2 View  Result Date: 04/09/2018 CLINICAL DATA:  Worsening shortness of breath. EXAM: CHEST - 2 VIEW COMPARISON:  Chest x-ray dated March 20, 2018. FINDINGS: Unchanged tunneled left internal jugular dialysis catheter with the tip in the proximal right atrium. Unchanged HERO graft in the right upper extremity terminating at the cavoatrial junction. The heart size and mediastinal contours are within normal limits. New mild pulmonary vascular congestion. Unchanged right pleural thickening and basilar scarring. Trace left pleural effusion. No pneumothorax. No acute osseous abnormality. IMPRESSION: 1. New mild pulmonary vascular congestion and trace left pleural effusion. 2. Unchanged right pleuroparenchymal scarring. Electronically Signed   By: Huntley Dec  Derry  M.D.   On: 04/09/2018 13:03   US Venous Img Upper Uni Right  Result Date: 04/10/2018 CLINICAL DATA:  Right arm swelling for 1 day EXAM: RIGHT UPPER EXTREMITY VENOUS DOPPLER ULTRASOUND TECHNIQUE: Gray-scale sonography with graded compression, as well as color Doppler and duplex ultrasound were performed to evaluate the upper extremity deep venous system from the level of the subclavian vein and including the jugular, axillary, basilic, radial, ulnar and upper cephalic vein. Spectral Doppler was utilized to evaluate flow at rest and with distal augmentation maneuvers. COMPARISON:  None. FINDINGS: Contralateral Subclavian Vein: Hypoechoic nonocclusive thrombus in the left subclavian vein. Phasic flow preserved. This may be chronic given the history of extensive hemodialysis access sites and interventions. Internal Jugular Vein: No evidence of thrombus. Normal compressibility, respiratory phasicity and response to augmentation. Subclavian Vein: No evidence of thrombus. Normal compressibility, respiratory phasicity and response to augmentation. Axillary Vein: No evidence of thrombus. Normal compressibility, respiratory phasicity and response to augmentation. Cephalic Vein: No evidence of thrombus. Normal compressibility, respiratory phasicity and response to augmentation. Basilic Vein: No evidence of thrombus. Normal compressibility, respiratory phasicity and response to augmentation. Brachial Veins: No evidence of thrombus. Normal compressibility, respiratory phasicity and response to augmentation. Radial Veins: No evidence of thrombus. Normal compressibility, respiratory phasicity and response to augmentation. Ulnar Veins: No evidence of thrombus. Normal compressibility, respiratory phasicity and response to augmentation. Venous Reflux:  None visualized. Other Findings: Right upper arm hero dialysis access is occluded/thrombosed. IMPRESSION: No evidence of significant DVT within the right upper extremity. Right  upper arm hero dialysis access is thrombosed LEFT subclavian nonocclusive thrombus noted, this may be chronic given the extensive hemodialysis access sites and interventions. Electronically Signed   By: Jerilynn Mages.  Shick M.D.   On: 04/10/2018 09:48    Assessment/Plan  Accelerated hypertension This is clearly a cause of his renal failure and blood pressure control important in reducing the progression of atherosclerotic disease. On appropriate oral medications.   Hyperlipidemia lipid control important in reducing the progression of atherosclerotic disease. Continue statin therapy   ESRD on dialysis [facility]) The patient has a very difficult dialysis access situation.  I think it would be reasonable to try to open his right arm hero graft.  He has already had several failed access options.  He has been catheter dependent for quite some time.  We discussed percutaneous thrombectomy versus open surgical thrombectomy.  Given the long-standing nature of the occlusion, I think a surgical thrombectomy with replacement of his central venous portion would be the safest option and the lowest risk of pulmonary embolus.  I discussed the risks and benefits of the procedure.  I discussed it could still thrombosed and we may have to look for new access options following this.  He and his wife are agreeable to proceed.    Leotis Pain, MD  04/16/2018 9:52 AM    This note was created with Dragon medical transcription system.  Any errors from dictation are purely unintentional

## 2018-04-17 DIAGNOSIS — N186 End stage renal disease: Secondary | ICD-10-CM | POA: Diagnosis not present

## 2018-04-17 DIAGNOSIS — N2581 Secondary hyperparathyroidism of renal origin: Secondary | ICD-10-CM | POA: Diagnosis not present

## 2018-04-17 DIAGNOSIS — D631 Anemia in chronic kidney disease: Secondary | ICD-10-CM | POA: Diagnosis not present

## 2018-04-22 ENCOUNTER — Other Ambulatory Visit (INDEPENDENT_AMBULATORY_CARE_PROVIDER_SITE_OTHER): Payer: Self-pay | Admitting: Vascular Surgery

## 2018-04-22 DIAGNOSIS — D631 Anemia in chronic kidney disease: Secondary | ICD-10-CM | POA: Diagnosis not present

## 2018-04-22 DIAGNOSIS — N186 End stage renal disease: Secondary | ICD-10-CM | POA: Diagnosis not present

## 2018-04-22 DIAGNOSIS — N2581 Secondary hyperparathyroidism of renal origin: Secondary | ICD-10-CM | POA: Diagnosis not present

## 2018-04-23 ENCOUNTER — Encounter
Admission: RE | Admit: 2018-04-23 | Discharge: 2018-04-23 | Disposition: A | Discharge status: Home / Self Care | Payer: Medicare Other | Source: Ambulatory Visit | Attending: Vascular Surgery | Admitting: Vascular Surgery

## 2018-04-23 ENCOUNTER — Inpatient Hospital Stay: Payer: Medicare Other

## 2018-04-23 ENCOUNTER — Other Ambulatory Visit: Payer: Self-pay

## 2018-04-23 DIAGNOSIS — J449 Chronic obstructive pulmonary disease, unspecified: Secondary | ICD-10-CM | POA: Diagnosis not present

## 2018-04-23 DIAGNOSIS — G4730 Sleep apnea, unspecified: Secondary | ICD-10-CM | POA: Diagnosis not present

## 2018-04-23 DIAGNOSIS — Y832 Surgical operation with anastomosis, bypass or graft as the cause of abnormal reaction of the patient, or of later complication, without mention of misadventure at the time of the procedure: Secondary | ICD-10-CM | POA: Diagnosis not present

## 2018-04-23 DIAGNOSIS — N186 End stage renal disease: Secondary | ICD-10-CM | POA: Diagnosis not present

## 2018-04-23 DIAGNOSIS — T82868A Thrombosis of vascular prosthetic devices, implants and grafts, initial encounter: Secondary | ICD-10-CM | POA: Diagnosis not present

## 2018-04-23 DIAGNOSIS — D631 Anemia in chronic kidney disease: Secondary | ICD-10-CM | POA: Diagnosis not present

## 2018-04-23 DIAGNOSIS — I509 Heart failure, unspecified: Secondary | ICD-10-CM | POA: Diagnosis not present

## 2018-04-23 DIAGNOSIS — I132 Hypertensive heart and chronic kidney disease with heart failure and with stage 5 chronic kidney disease, or end stage renal disease: Secondary | ICD-10-CM | POA: Diagnosis not present

## 2018-04-23 HISTORY — DX: Dyspnea, unspecified: R06.00

## 2018-04-23 HISTORY — DX: Chronic obstructive pulmonary disease, unspecified: J44.9

## 2018-04-23 HISTORY — DX: Cardiac murmur, unspecified: R01.1

## 2018-04-23 LAB — TYPE AND SCREEN
ABO/RH(D): A POS
Antibody Screen: NEGATIVE

## 2018-04-23 LAB — BASIC METABOLIC PANEL
ANION GAP: 13 (ref 5–15)
BUN: 15 mg/dL (ref 6–20)
CO2: 31 mmol/L (ref 22–32)
Calcium: 8.9 mg/dL (ref 8.9–10.3)
Chloride: 99 mmol/L (ref 98–111)
Creatinine, Ser: 11.07 mg/dL — ABNORMAL HIGH (ref 0.61–1.24)
GFR, EST AFRICAN AMERICAN: 5 mL/min — AB (ref >60)
GFR, EST NON AFRICAN AMERICAN: 5 mL/min — AB (ref >60)
GLUCOSE: 88 mg/dL (ref 70–99)
Potassium: 3.7 mmol/L (ref 3.5–5.1)
Sodium: 143 mmol/L (ref 135–145)

## 2018-04-23 LAB — CBC WITH DIFFERENTIAL/PLATELET
BASOS PCT: 1 %
Basophils Absolute: 0 10*3/uL (ref 0–0.1)
EOS PCT: 4 %
Eosinophils Absolute: 0.1 10*3/uL (ref 0–0.7)
HEMATOCRIT: 29.5 % — AB (ref 40.0–52.0)
Hemoglobin: 9.7 g/dL — ABNORMAL LOW (ref 13.0–18.0)
LYMPHS ABS: 0.5 10*3/uL — AB (ref 1.0–3.6)
LYMPHS PCT: 22 %
MCH: 28.6 pg (ref 26.0–34.0)
MCHC: 32.8 g/dL (ref 32.0–36.0)
MCV: 87 fL (ref 80.0–100.0)
MONO ABS: 0.3 10*3/uL (ref 0.2–1.0)
Monocytes Relative: 12 %
Neutro Abs: 1.5 10*3/uL (ref 1.4–6.5)
Neutrophils Relative %: 61 %
PLATELETS: 119 10*3/uL — AB (ref 150–440)
RBC: 3.39 MIL/uL — ABNORMAL LOW (ref 4.40–5.90)
RDW: 18.8 % — AB (ref 11.5–14.5)
WBC: 2.4 10*3/uL — ABNORMAL LOW (ref 3.8–10.6)

## 2018-04-23 LAB — SURGICAL PCR SCREEN
MRSA, PCR: NEGATIVE
Staphylococcus aureus: NEGATIVE

## 2018-04-23 LAB — PROTIME-INR
INR: 1.09
Prothrombin Time: 14 seconds (ref 11.4–15.2)

## 2018-04-23 LAB — APTT: APTT: 36 s (ref 24–36)

## 2018-04-23 NOTE — Patient Instructions (Signed)
Your procedure is scheduled on: 04/25/18 Report to Day Surgery. [facility] SECOND FLOOR  To find out your arrival time please call [phone] between 1PM - 3PM on 04/24/18.  Remember: Instructions that are not followed completely may result in serious medical risk,  up to and including death, or upon the discretion of your surgeon and anesthesiologist your  surgery may need to be rescheduled.     _X__ 1. Do not eat food after midnight the night before your procedure.                 No gum chewing or hard candies. You may drink clear liquids up to 2 hours                 before you are scheduled to arrive for your surgery- DO not drink clear                 liquids within 2 hours of the start of your surgery.                 Clear Liquids include:  water, apple juice without pulp, clear carbohydrate                 drink such as Clearfast of Gatorade, Black Coffee or Tea (Do not add                 anything to coffee or tea).  __X__2.  On the morning of surgery brush your teeth with toothpaste and water, you                may rinse your mouth with mouthwash if you wish.  Do not swallow any toothpaste of mouthwash.     _X__ 3.  No Alcohol for 24 hours before or after surgery.   _X__ 4.  Do Not Smoke or use e-cigarettes For 24 Hours Prior to Your Surgery.                 Do not use any chewable tobacco products for at least 6 hours prior to                 surgery.  ____  5.  Bring all medications with you on the day of surgery if instructed.   __X__  6.  Notify your doctor if there is any change in your medical condition      (cold, fever, infections).     Do not wear jewelry, make-up, hairpins, clips or nail polish. Do not wear lotions, powders, or perfumes. You may wear deodorant. Do not shave 48 hours prior to surgery. Men may shave face and neck. Do not bring valuables to the hospital.    [facility] is not responsible for any belongings or  valuables.  Contacts, dentures or bridgework may not be worn into surgery. Leave your suitcase in the car. After surgery it may be brought to your room. For patients admitted to the hospital, discharge time is determined by your treatment team.   Patients discharged the day of surgery will not be allowed to drive home.   Please read over the following fact sheets that you were given:   Surgical Site Infection Prevention /MRSA  __X__ Take these medicines the morning of surgery with A SIP OF WATER:    1.CARVEDILOL  2. HYDRALAZINE   3.   4.  5.  6.  ____ Fleet Enema (as directed)   __X__ Use CHG Soap as directed  ____ Use inhalers on the day of surgery  ____ Stop metformin 2 days prior to surgery    ____ Take 1/2 of usual insulin dose the night before surgery. No insulin the morning          of surgery.   ____ Stop Coumadin/Plavix/aspirin on  ____ Stop Anti-inflammatories on  ____ Stop supplements until after surgery.    _X___ Bring C-Pap to the hospital.

## 2018-04-23 NOTE — Pre-Procedure Instructions (Addendum)
PATIENT HAD NOT TAKEN BP MEDS TODAY. REVIEWED BP FROM 04/09/18 INPATIENT STAY. NOTIFIED LAURA AT DR DEW'S AND PATIENT STRONGLY INSTRUCTED TO TAKE MEDICATIONS NO BP,IV,STICKS RIGHT ARM

## 2018-04-23 NOTE — Anesthesia Pain Management Evaluation Note (Signed)
NO BP,STICKS,IV RIGHT ARM

## 2018-04-24 DIAGNOSIS — N186 End stage renal disease: Secondary | ICD-10-CM | POA: Diagnosis not present

## 2018-04-24 DIAGNOSIS — D631 Anemia in chronic kidney disease: Secondary | ICD-10-CM | POA: Diagnosis not present

## 2018-04-24 DIAGNOSIS — N2581 Secondary hyperparathyroidism of renal origin: Secondary | ICD-10-CM | POA: Diagnosis not present

## 2018-04-25 ENCOUNTER — Encounter: Payer: Self-pay | Admitting: *Deleted

## 2018-04-25 ENCOUNTER — Ambulatory Visit: Payer: Medicare Other

## 2018-04-25 ENCOUNTER — Encounter
Admission: RE | Disposition: A | Discharge status: Home / Self Care | Payer: Self-pay | Source: Ambulatory Visit | Attending: Vascular Surgery

## 2018-04-25 ENCOUNTER — Ambulatory Visit: Payer: Medicare Other | Admitting: Certified Registered Nurse Anesthetist

## 2018-04-25 ENCOUNTER — Ambulatory Visit
Admission: RE | Admit: 2018-04-25 | Discharge: 2018-04-25 | Disposition: A | Discharge status: Home / Self Care | Payer: Medicare Other | Source: Ambulatory Visit | Attending: Vascular Surgery | Admitting: Vascular Surgery

## 2018-04-25 DIAGNOSIS — J449 Chronic obstructive pulmonary disease, unspecified: Secondary | ICD-10-CM | POA: Diagnosis not present

## 2018-04-25 DIAGNOSIS — T8249XS Other complication of vascular dialysis catheter, sequela: Secondary | ICD-10-CM | POA: Diagnosis not present

## 2018-04-25 DIAGNOSIS — D631 Anemia in chronic kidney disease: Secondary | ICD-10-CM | POA: Insufficient documentation

## 2018-04-25 DIAGNOSIS — Y832 Surgical operation with anastomosis, bypass or graft as the cause of abnormal reaction of the patient, or of later complication, without mention of misadventure at the time of the procedure: Secondary | ICD-10-CM | POA: Insufficient documentation

## 2018-04-25 DIAGNOSIS — G4730 Sleep apnea, unspecified: Secondary | ICD-10-CM | POA: Insufficient documentation

## 2018-04-25 DIAGNOSIS — N186 End stage renal disease: Secondary | ICD-10-CM | POA: Insufficient documentation

## 2018-04-25 DIAGNOSIS — I132 Hypertensive heart and chronic kidney disease with heart failure and with stage 5 chronic kidney disease, or end stage renal disease: Secondary | ICD-10-CM | POA: Diagnosis not present

## 2018-04-25 DIAGNOSIS — I5033 Acute on chronic diastolic (congestive) heart failure: Secondary | ICD-10-CM | POA: Diagnosis not present

## 2018-04-25 DIAGNOSIS — T82868A Thrombosis of vascular prosthetic devices, implants and grafts, initial encounter: Secondary | ICD-10-CM | POA: Diagnosis not present

## 2018-04-25 DIAGNOSIS — E785 Hyperlipidemia, unspecified: Secondary | ICD-10-CM | POA: Diagnosis not present

## 2018-04-25 DIAGNOSIS — Z01810 Encounter for preprocedural cardiovascular examination: Secondary | ICD-10-CM | POA: Diagnosis not present

## 2018-04-25 DIAGNOSIS — G4733 Obstructive sleep apnea (adult) (pediatric): Secondary | ICD-10-CM | POA: Diagnosis not present

## 2018-04-25 DIAGNOSIS — I509 Heart failure, unspecified: Secondary | ICD-10-CM | POA: Diagnosis not present

## 2018-04-25 HISTORY — PX: LIGATIONS OF HERO GRAFT: SHX6714

## 2018-04-25 LAB — POCT I-STAT 4, (NA,K, GLUC, HGB,HCT)
Glucose, Bld: 91 mg/dL (ref 70–99)
HEMATOCRIT: 25 % — AB (ref 39.0–52.0)
HEMOGLOBIN: 8.5 g/dL — AB (ref 13.0–17.0)
Potassium: 3.6 mmol/L (ref 3.5–5.1)
Sodium: 140 mmol/L (ref 135–145)

## 2018-04-25 SURGERY — LIGATIONS OF HERO GRAFT
Anesthesia: General | Laterality: Right

## 2018-04-25 MED ORDER — SODIUM CHLORIDE FLUSH 0.9 % IV SOLN
INTRAVENOUS | Status: AC
  Filled 2018-04-25: qty 10

## 2018-04-25 MED ORDER — CHLORHEXIDINE GLUCONATE CLOTH 2 % EX PADS: 6.0000 | MEDICATED_PAD | Freq: Once | CUTANEOUS | Status: DC

## 2018-04-25 MED ORDER — LABETALOL HCL 5 MG/ML IV SOLN
INTRAVENOUS | Status: AC
  Administered 2018-04-25: 10 mg via INTRAVENOUS
  Filled 2018-04-25: qty 4

## 2018-04-25 MED ORDER — HEPARIN SODIUM (PORCINE) 1000 UNIT/ML IJ SOLN
3000.0000 [IU] | Freq: Once | INTRAMUSCULAR | Status: DC
  Filled 2018-04-25: qty 3

## 2018-04-25 MED ORDER — LABETALOL HCL 5 MG/ML IV SOLN
INTRAVENOUS | Status: AC
  Filled 2018-04-25: qty 4

## 2018-04-25 MED ORDER — SEVOFLURANE IN SOLN
RESPIRATORY_TRACT | Status: AC
  Filled 2018-04-25: qty 250

## 2018-04-25 MED ORDER — PHENYLEPHRINE HCL 10 MG/ML IJ SOLN
INTRAMUSCULAR | Status: DC | PRN
  Administered 2018-04-25 (×4): 100 ug via INTRAVENOUS

## 2018-04-25 MED ORDER — HEPARIN SODIUM (PORCINE) 1000 UNIT/ML IJ SOLN
INTRAMUSCULAR | Status: DC | PRN
  Administered 2018-04-25: 3000 [IU] via INTRAVENOUS

## 2018-04-25 MED ORDER — CEFAZOLIN SODIUM-DEXTROSE 2-4 GM/100ML-% IV SOLN
INTRAVENOUS | Status: AC
  Filled 2018-04-25: qty 100

## 2018-04-25 MED ORDER — MIDAZOLAM HCL 2 MG/2ML IJ SOLN
INTRAMUSCULAR | Status: AC
  Filled 2018-04-25: qty 2

## 2018-04-25 MED ORDER — LIDOCAINE HCL (CARDIAC) PF 100 MG/5ML IV SOSY
PREFILLED_SYRINGE | INTRAVENOUS | Status: DC | PRN
  Administered 2018-04-25: 100 mg via INTRAVENOUS

## 2018-04-25 MED ORDER — CEFAZOLIN SODIUM-DEXTROSE 2-4 GM/100ML-% IV SOLN
2.0000 g | INTRAVENOUS | Status: AC
  Administered 2018-04-25: 2 g via INTRAVENOUS

## 2018-04-25 MED ORDER — FENTANYL CITRATE (PF) 100 MCG/2ML IJ SOLN
25.0000 ug | INTRAMUSCULAR | Status: DC | PRN
  Administered 2018-04-25: 25 ug via INTRAVENOUS

## 2018-04-25 MED ORDER — MIDAZOLAM HCL 2 MG/2ML IJ SOLN
INTRAMUSCULAR | Status: DC | PRN
  Administered 2018-04-25: 2 mg via INTRAVENOUS

## 2018-04-25 MED ORDER — HYDROCODONE-ACETAMINOPHEN 5-325 MG PO TABS
ORAL_TABLET | ORAL | Status: AC
  Filled 2018-04-25: qty 1

## 2018-04-25 MED ORDER — DEXAMETHASONE SODIUM PHOSPHATE 10 MG/ML IJ SOLN
INTRAMUSCULAR | Status: DC | PRN
  Administered 2018-04-25: 20 mg via INTRAVENOUS
  Administered 2018-04-25: 10 mg via INTRAVENOUS

## 2018-04-25 MED ORDER — ONDANSETRON HCL 4 MG/2ML IJ SOLN: 4.0000 mg | Freq: Once | INTRAMUSCULAR | Status: DC | PRN

## 2018-04-25 MED ORDER — FAMOTIDINE 20 MG PO TABS
20.0000 mg | ORAL_TABLET | Freq: Once | ORAL | Status: AC
  Administered 2018-04-25: 20 mg via ORAL

## 2018-04-25 MED ORDER — FENTANYL CITRATE (PF) 100 MCG/2ML IJ SOLN
INTRAMUSCULAR | Status: AC
  Filled 2018-04-25: qty 2

## 2018-04-25 MED ORDER — PROPOFOL 10 MG/ML IV BOLUS
INTRAVENOUS | Status: AC
  Filled 2018-04-25: qty 20

## 2018-04-25 MED ORDER — SODIUM CHLORIDE 0.9 % IV SOLN
INTRAVENOUS | Status: DC
  Administered 2018-04-25 (×2): via INTRAVENOUS

## 2018-04-25 MED ORDER — FENTANYL CITRATE (PF) 100 MCG/2ML IJ SOLN
INTRAMUSCULAR | Status: AC
  Administered 2018-04-25: 25 ug via INTRAVENOUS
  Filled 2018-04-25: qty 2

## 2018-04-25 MED ORDER — LABETALOL HCL 5 MG/ML IV SOLN
10.0000 mg | Freq: Once | INTRAVENOUS | Status: AC
  Administered 2018-04-25: 10 mg via INTRAVENOUS

## 2018-04-25 MED ORDER — HYDROCODONE-ACETAMINOPHEN 5-325 MG PO TABS: 1.0000 | ORAL_TABLET | Freq: Four times a day (QID) | ORAL | 0 refills | Status: DC | PRN

## 2018-04-25 MED ORDER — FAMOTIDINE 20 MG PO TABS
ORAL_TABLET | ORAL | Status: AC
  Administered 2018-04-25: 20 mg via ORAL
  Filled 2018-04-25: qty 1

## 2018-04-25 MED ORDER — FENTANYL CITRATE (PF) 100 MCG/2ML IJ SOLN
INTRAMUSCULAR | Status: DC | PRN
  Administered 2018-04-25 (×2): 50 ug via INTRAVENOUS

## 2018-04-25 MED ORDER — SODIUM CHLORIDE 0.9 % IV SOLN
INTRAVENOUS | Status: DC | PRN
  Administered 2018-04-25: 250 mL via INTRAMUSCULAR

## 2018-04-25 MED ORDER — HEPARIN SODIUM (PORCINE) 5000 UNIT/ML IJ SOLN
INTRAMUSCULAR | Status: AC
  Filled 2018-04-25: qty 1

## 2018-04-25 MED ORDER — ROCURONIUM BROMIDE 100 MG/10ML IV SOLN
INTRAVENOUS | Status: DC | PRN
  Administered 2018-04-25: 10 mg via INTRAVENOUS

## 2018-04-25 MED ORDER — SUCCINYLCHOLINE CHLORIDE 20 MG/ML IJ SOLN
INTRAMUSCULAR | Status: DC | PRN
  Administered 2018-04-25: 100 mg via INTRAVENOUS

## 2018-04-25 MED ORDER — EVICEL 2 ML EX KIT
PACK | CUTANEOUS | Status: DC | PRN
  Administered 2018-04-25: 2 mL

## 2018-04-25 MED ORDER — LIDOCAINE HCL (PF) 2 % IJ SOLN
INTRAMUSCULAR | Status: AC
  Filled 2018-04-25: qty 10

## 2018-04-25 MED ORDER — PROPOFOL 10 MG/ML IV BOLUS
INTRAVENOUS | Status: DC | PRN
  Administered 2018-04-25: 150 mg via INTRAVENOUS

## 2018-04-25 MED ORDER — HYDROCODONE-ACETAMINOPHEN 5-325 MG PO TABS
1.0000 | ORAL_TABLET | Freq: Once | ORAL | Status: AC
  Administered 2018-04-25: 1 via ORAL

## 2018-04-25 MED ORDER — ONDANSETRON HCL 4 MG/2ML IJ SOLN
INTRAMUSCULAR | Status: DC | PRN
  Administered 2018-04-25: 4 mg via INTRAVENOUS

## 2018-04-25 SURGICAL SUPPLY — 68 items
ADH SKN CLS APL DERMABOND .7 (GAUZE/BANDAGES/DRESSINGS) ×1
BAG COUNTER SPONGE EZ (MISCELLANEOUS) ×2 IMPLANT
BAG DECANTER FOR FLEXI CONT (MISCELLANEOUS) ×3 IMPLANT
BAG SPNG 4X4 CLR HAZ (MISCELLANEOUS) ×1
BLADE SURG 15 STRL LF DISP TIS (BLADE) ×1 IMPLANT
BLADE SURG 15 STRL SS (BLADE) ×3
BLADE SURG SZ11 CARB STEEL (BLADE) ×3 IMPLANT
BOOT SUTURE AID YELLOW STND (SUTURE) ×3 IMPLANT
BRUSH SCRUB EZ  4% CHG (MISCELLANEOUS) ×2
BRUSH SCRUB EZ 4% CHG (MISCELLANEOUS) ×1 IMPLANT
CANISTER SUCT 1200ML W/VALVE (MISCELLANEOUS) ×3 IMPLANT
CATH EMBOLECTOMY 3X80 (CATHETERS) ×2 IMPLANT
CATH EMBOLECTOMY 4X80 (CATHETERS) ×2 IMPLANT
CHLORAPREP W/TINT 26ML (MISCELLANEOUS) ×6 IMPLANT
CLIP SPRNG 6 S-JAW DBL (CLIP) ×1 IMPLANT
CLIP SPRNG 6MM S-JAW DBL (CLIP) ×3
COMPONENT HERO ACCESSORY KIT (VASCULAR PRODUCTS) ×3 IMPLANT
COMPONENT HERO ARTERIAL GRAFT (Vascular Products) ×3 IMPLANT
COMPONENT HERO VENOUS OVERFLOW (Vascular Products) ×3 IMPLANT
COUNTER SPONGE BAG EZ (MISCELLANEOUS) ×1
COVER PROBE FLX POLY STRL (MISCELLANEOUS) ×3 IMPLANT
DERMABOND ADVANCED (GAUZE/BANDAGES/DRESSINGS) ×2
DERMABOND ADVANCED .7 DNX12 (GAUZE/BANDAGES/DRESSINGS) ×1 IMPLANT
DRAPE C-ARM XRAY 36X54 (DRAPES) ×3 IMPLANT
DRAPE INCISE IOBAN 66X45 STRL (DRAPES) ×3 IMPLANT
DRAPE SHEET LG 3/4 BI-LAMINATE (DRAPES) ×3 IMPLANT
ELECT CAUTERY BLADE 6.4 (BLADE) ×3 IMPLANT
ELECT REM PT RETURN 9FT ADLT (ELECTROSURGICAL) ×3
ELECTRODE REM PT RTRN 9FT ADLT (ELECTROSURGICAL) ×1 IMPLANT
GLOVE BIO SURGEON STRL SZ7 (GLOVE) ×6 IMPLANT
GLOVE INDICATOR 7.5 STRL GRN (GLOVE) ×3 IMPLANT
GOWN STRL REUS W/ TWL LRG LVL3 (GOWN DISPOSABLE) ×3 IMPLANT
GOWN STRL REUS W/TWL LRG LVL3 (GOWN DISPOSABLE) ×9
GUIDEWIRE SUPER STIFF .035X180 (WIRE) ×2 IMPLANT
HEMOSTAT SURGICEL 2X3 (HEMOSTASIS) ×3 IMPLANT
IV NS 500ML (IV SOLUTION) ×3
IV NS 500ML BAXH (IV SOLUTION) ×1 IMPLANT
KIT TURNOVER KIT A (KITS) ×3 IMPLANT
LABEL OR SOLS (LABEL) ×3 IMPLANT
LOOP RED MAXI  1X406MM (MISCELLANEOUS) ×4
LOOP VESSEL MAXI 1X406 RED (MISCELLANEOUS) ×2 IMPLANT
LOOP VESSEL MINI 0.8X406 BLUE (MISCELLANEOUS) ×1 IMPLANT
LOOPS BLUE MINI 0.8X406MM (MISCELLANEOUS) ×2
NDL FILTER BLUNT 18X1 1/2 (NEEDLE) ×1 IMPLANT
NEEDLE FILTER BLUNT 18X 1/2SAF (NEEDLE) ×2
NEEDLE FILTER BLUNT 18X1 1/2 (NEEDLE) ×1 IMPLANT
PACK ANGIOGRAPHY (CUSTOM PROCEDURE TRAY) ×3 IMPLANT
PACK BASIN MAJOR ARMC (MISCELLANEOUS) ×3 IMPLANT
PACK UNIVERSAL (MISCELLANEOUS) ×3 IMPLANT
PAD PREP 24X41 OB/GYN DISP (PERSONAL CARE ITEMS) ×3 IMPLANT
SOLUTION CELL SAVER (CLIP) ×1 IMPLANT
STOCKINETTE 48X4 2 PLY STRL (GAUZE/BANDAGES/DRESSINGS) ×1 IMPLANT
STOCKINETTE STRL 4IN 9604848 (GAUZE/BANDAGES/DRESSINGS) ×3 IMPLANT
SUT GTX CV-6 30 (SUTURE) ×6 IMPLANT
SUT MNCRL AB 4-0 PS2 18 (SUTURE) ×3 IMPLANT
SUT PROLENE 6 0 BV (SUTURE) ×6 IMPLANT
SUT SILK 2 0 (SUTURE) ×3
SUT SILK 2 0 SH (SUTURE) ×3 IMPLANT
SUT SILK 2-0 18XBRD TIE 12 (SUTURE) ×1 IMPLANT
SUT SILK 3 0 (SUTURE) ×3
SUT SILK 3-0 18XBRD TIE 12 (SUTURE) ×1 IMPLANT
SUT SILK 4 0 (SUTURE) ×3
SUT SILK 4-0 18XBRD TIE 12 (SUTURE) ×1 IMPLANT
SUT VIC AB 3-0 SH 27 (SUTURE) ×6
SUT VIC AB 3-0 SH 27X BRD (SUTURE) ×2 IMPLANT
SYR 20CC LL (SYRINGE) ×3 IMPLANT
SYR 3ML LL SCALE MARK (SYRINGE) ×3 IMPLANT
TOWEL OR 17X26 4PK STRL BLUE (TOWEL DISPOSABLE) ×3 IMPLANT

## 2018-04-25 NOTE — Anesthesia Post-op Follow-up Note (Signed)
Anesthesia QCDR form completed.        

## 2018-04-25 NOTE — Anesthesia Postprocedure Evaluation (Signed)
Anesthesia Post Note  Patient: FAITH BRANAN  Procedure(s) Performed: LIGATIONS OF HERO GRAFT ( REVISION ) (Right )  Patient location during evaluation: PACU Anesthesia Type: General Level of consciousness: awake and alert Pain management: pain level controlled Vital Signs Assessment: post-procedure vital signs reviewed and stable Respiratory status: spontaneous breathing, nonlabored ventilation, respiratory function stable and patient connected to nasal cannula oxygen Cardiovascular status: blood pressure returned to baseline and stable Postop Assessment: no apparent nausea or vomiting Anesthetic complications: no     Last Vitals:  Vitals:   04/25/18 1800 04/25/18 1814  BP: (!) 186/120 (!) 184/107  Pulse: 71 60  Resp: 16 17  Temp:    SpO2: 98% 97%    Last Pain:  Vitals:   04/25/18 1800  TempSrc:   PainSc: 4                  Adarrius Graeff K Leilanie Rauda

## 2018-04-25 NOTE — Op Note (Signed)
[facility]  OPERATIVE NOTE   PROCEDURE:  1.  Removal of central venous portion and the majority of the PTFE portion of the previous right arm hero graft 2.  Thrombectomy of previously thrombosed right arm hero graft 3.  Revision and replacement of right arm hero graft with completely new right central venous portion and a new PTFE portion anastomosed to the previous PTFE portion of the hero graft after thrombectomy  PRE-OPERATIVE DIAGNOSIS: 1. end stage renal disease  2. Multiple failed previous dialysis accesses with long-standing occlusion of the right arm hero graft  POST-OPERATIVE DIAGNOSIS: same  SURGEON: Leotis Pain, MD  ASSISTANT(S): Hezzie Bump, PA-C  ANESTHESIA: general  ESTIMATED BLOOD LOSS: 100 cc  FINDING(S): 1. Thrombosed hero graft  SPECIMEN(S):  None  INDICATIONS:   Patient is a 52 y.o.male who presents with end stage renal disease and multiple failed previous dialysis access sees. The patient is brought in for a HERO graft revision on the right arm.  Risk, benefits, and alternatives to access surgery were discussed.  The patient is aware the risks include but are not limited to: bleeding, infection, steal syndrome, nerve damage, ischemic monomelic neuropathy, failure to mature, and need for additional procedures.  The patient is aware of the risks and elects to proceed forward.  DESCRIPTION: After full informed written consent was obtained from the patient, the patient was brought back to the operating room and placed supine upon the operating table.  The patient was given IV antibiotics prior to proceeding.  After obtaining adequate sedation, the patient was prepped and draped in standard fashion for a right arm access procedure. I started by making a cut down overlying the grommet connecting the PTFE portion of the central venous portion.  The PTFE portion in the central portion were controlled  in 3000 units of heparin were given.  I then disconnected the grommet and placed an Amplatz superstiff wire through the previous central venous portion and remove this.  Using fluoroscopic guidance through a peel-away sheath a new central venous portion was placed and parked in the right atrium.  It was then locally flushed with heparin and clamped.  I then turned my attention to the PTFE portion.  With the grommet in place, attempts to clear the graft without removal with the 3 and 4 Fogarty balloons were unsuccessful.  I could never traversed through the arterial anastomosis and with the grommet in place the evacuation of thrombus was incomplete.  After multiple attempts, I made a cut down near the previous incision in the mid brachial artery and dissected out the graft only a few centimeters from the anastomosis.  The graft was then transected and the PTFE portion more centrally that would be replaced was removed and sent a specimen.  I then made 4 passes with a 4 Fogarty embolectomy balloon and was able to restore brisk arterial inflow.  The graft was then clamped.  The new PTFE portion of the hero graft was tunneled through the old tunnel.  The grommet was connected to the central venous portion.  Using fluoroscopy, the catheter tip was parked in the right atrium and the graft was pulled to an appropriate length to create the anastomosis to the old graft.  2 CV 6 sutures were used to create an end-to-end anastomosis.  Prior to completing the anastomosis, the vessel was flushed and de-aired. At this point, then I completed the anastomosis in the usual fashion.  I released the vessel loops on the  inflow and allowed the artery to decompress through the graft. There was good pulsatile flow demonstrated immediately through the HERO graft. Fluoroscopy was used to confirm the catheter tip to be in central venous location without kinking or twisting of any portion of the graft. Surgicel and Evicel were then placed.  There was no more active bleeding.  The subcutaneous tissue was reapproximated with a running stitch of 3-0 Vicryl.  The skin was then reapproximated with a running subcuticular 4-0 Monocryl.  The skin was then cleaned, dried, and Dermabond used to reinforce the skin closure.  We then turned our attention to the shoulder incision.  The subcutaneous tissue was repaired with running stitch of 3-0 Vicryl.  The skin was then reapproximated with running subcuticular 4-0 Monocryl.  The skin was then cleaned, dried, and then the skin closure was reinforced with Dermabond.  The patient was then awakened from anesthesia and taken to the recovery room in stable condition having tolerated the procedure well.    COMPLICATIONS: None  CONDITION: Stable   Leotis Pain 04/25/2018 4:28 PM   This note was created with Dragon Medical transcription system. Any errors in dictation are purely unintentional.

## 2018-04-25 NOTE — H&P (Signed)
[facility] History & Physical Update  The patient was interviewed and re-examined.  The patient's previous History and Physical has been reviewed and is unchanged.  There is no change in the plan of care. We plan to proceed with the scheduled procedure.  Leotis Pain, MD  04/25/2018, 1:45 PM

## 2018-04-25 NOTE — Discharge Instructions (Signed)
AMBULATORY SURGERY  °DISCHARGE INSTRUCTIONS ° ° °1) The drugs that you were given will stay in your system until tomorrow so for the next 24 hours you should not: ° °A) Drive an automobile °B) Make any legal decisions °C) Drink any alcoholic beverage ° ° °2) You may resume regular meals tomorrow.  Today it is better to start with liquids and gradually work up to solid foods. ° °You may eat anything you prefer, but it is better to start with liquids, then soup and crackers, and gradually work up to solid foods. ° ° °3) Please notify your doctor immediately if you have any unusual bleeding, trouble breathing, redness and pain at the surgery site, drainage, fever, or pain not relieved by medication. ° ° ° °4) Additional Instructions: ° ° ° ° ° ° ° °Please contact your physician with any problems or Same Day Surgery at [phone], Monday through Friday 6 am to 4 pm, or [place] [facility] [place] Main number at [phone].AMBULATORY SURGERY  °DISCHARGE INSTRUCTIONS ° ° °5) The drugs that you were given will stay in your system until tomorrow so for the next 24 hours you should not: ° °D) Drive an automobile °E) Make any legal decisions °F) Drink any alcoholic beverage ° ° °6) You may resume regular meals tomorrow.  Today it is better to start with liquids and gradually work up to solid foods. ° °You may eat anything you prefer, but it is better to start with liquids, then soup and crackers, and gradually work up to solid foods. ° ° °7) Please notify your doctor immediately if you have any unusual bleeding, trouble breathing, redness and pain at the surgery site, drainage, fever, or pain not relieved by medication. ° ° ° °8) Additional Instructions: ° ° ° ° ° ° ° °Please contact your physician with any problems or Same Day Surgery at [phone], Monday through Friday 6 am to 4 pm, or [place] [facility] [place] Main number at [phone]. °

## 2018-04-25 NOTE — Anesthesia Preprocedure Evaluation (Addendum)
Anesthesia Evaluation  Patient identified by MRN, date of birth, ID band Patient awake    Reviewed: Allergy & Precautions, NPO status , Patient's Chart, lab work & pertinent test results  Airway Mallampati: II       Dental  (+) Teeth Intact   Pulmonary shortness of breath, asthma , sleep apnea , pneumonia, resolved, COPD,    breath sounds clear to auscultation       Cardiovascular Exercise Tolerance: Good hypertension, Pt. on medications and Pt. on home beta blockers +CHF  + Valvular Problems/Murmurs  Rhythm:Regular Rate:Normal     Neuro/Psych negative neurological ROS  negative psych ROS   GI/Hepatic negative GI ROS, Neg liver ROS,   Endo/Other  negative endocrine ROS  Renal/GU ESRF and DialysisRenal disease     Musculoskeletal negative musculoskeletal ROS (+)   Abdominal Normal abdominal exam  (+)   Peds  Hematology  (+) anemia ,   Anesthesia Other Findings   Reproductive/Obstetrics                             Anesthesia Physical  Anesthesia Plan  ASA: III  Anesthesia Plan: General   Post-op Pain Management:    Induction: Intravenous  PONV Risk Score and Plan:   Airway Management Planned: Oral ETT  Additional Equipment:   Intra-op Plan:   Post-operative Plan: Extubation in OR  Informed Consent: I have reviewed the patients History and Physical, chart, labs and discussed the procedure including the risks, benefits and alternatives for the proposed anesthesia with the patient or authorized representative who has indicated his/her understanding and acceptance.     Plan Discussed with: CRNA and Surgeon  Anesthesia Plan Comments:         Anesthesia Quick Evaluation

## 2018-04-25 NOTE — Transfer of Care (Signed)
Immediate Anesthesia Transfer of Care Note  Patient: Jonathon Conley  Procedure(s) Performed: LIGATIONS OF HERO GRAFT ( REVISION ) (Right )  Patient Location: PACU  Anesthesia Type:General  Level of Consciousness: sedated  Airway & Oxygen Therapy: Patient Spontanous Breathing and Patient connected to face mask oxygen  Post-op Assessment: Report given to RN and Post -op Vital signs reviewed and stable  Post vital signs: Reviewed and stable  Last Vitals:  Vitals Value Taken Time  BP 198/119 04/25/2018  5:06 PM  Temp    Pulse 65 04/25/2018  5:07 PM  Resp 19 04/25/2018  5:07 PM  SpO2 96 % 04/25/2018  5:07 PM  Vitals shown include unvalidated device data.  Last Pain:  Vitals:   04/25/18 1223  PainSc: 0-No pain         Complications: No apparent anesthesia complications

## 2018-04-25 NOTE — Progress Notes (Signed)
Informed Dr. Amie Critchley bp 178/110 after labetalol admin.  Pt's pre op diastolic BP was 973.  Dr. Amie Critchley stated "Ok with BP."

## 2018-04-25 NOTE — Anesthesia Procedure Notes (Signed)
Procedure Name: Intubation Date/Time: 04/25/2018 2:39 PM Performed by: Nelda Marseille, CRNA Pre-anesthesia Checklist: Patient identified, Patient being monitored, Timeout performed, Emergency Drugs available and Suction available Patient Re-evaluated:Patient Re-evaluated prior to induction Oxygen Delivery Method: Circle system utilized Preoxygenation: Pre-oxygenation with 100% oxygen Induction Type: IV induction Ventilation: Mask ventilation without difficulty Laryngoscope Size: Mac, 3 and McGraph Grade View: Grade III Tube type: Oral Tube size: 7.5 mm Number of attempts: 1 Airway Equipment and Method: Stylet Placement Confirmation: ETT inserted through vocal cords under direct vision,  positive ETCO2 and breath sounds checked- equal and bilateral Secured at: 21 cm Tube secured with: Tape Dental Injury: Teeth and Oropharynx as per pre-operative assessment

## 2018-04-26 ENCOUNTER — Encounter: Payer: Self-pay | Admitting: Vascular Surgery

## 2018-04-26 DIAGNOSIS — D631 Anemia in chronic kidney disease: Secondary | ICD-10-CM | POA: Diagnosis not present

## 2018-04-26 DIAGNOSIS — N2581 Secondary hyperparathyroidism of renal origin: Secondary | ICD-10-CM | POA: Diagnosis not present

## 2018-04-26 DIAGNOSIS — N186 End stage renal disease: Secondary | ICD-10-CM | POA: Diagnosis not present

## 2018-04-29 ENCOUNTER — Encounter: Payer: Self-pay | Admitting: Vascular Surgery

## 2018-04-29 DIAGNOSIS — N186 End stage renal disease: Secondary | ICD-10-CM | POA: Diagnosis not present

## 2018-04-29 DIAGNOSIS — D631 Anemia in chronic kidney disease: Secondary | ICD-10-CM | POA: Diagnosis not present

## 2018-04-29 DIAGNOSIS — N2581 Secondary hyperparathyroidism of renal origin: Secondary | ICD-10-CM | POA: Diagnosis not present

## 2018-04-29 LAB — SURGICAL PATHOLOGY

## 2018-04-30 ENCOUNTER — Encounter (INDEPENDENT_AMBULATORY_CARE_PROVIDER_SITE_OTHER): Payer: Self-pay | Admitting: Vascular Surgery

## 2018-05-01 ENCOUNTER — Telehealth (INDEPENDENT_AMBULATORY_CARE_PROVIDER_SITE_OTHER): Payer: Self-pay | Admitting: Vascular Surgery

## 2018-05-01 NOTE — Telephone Encounter (Signed)
Message left for patient to call and schedule for today or tomorrow.

## 2018-05-02 ENCOUNTER — Other Ambulatory Visit: Payer: Self-pay

## 2018-05-02 ENCOUNTER — Inpatient Hospital Stay: Payer: Medicare Other

## 2018-05-02 ENCOUNTER — Emergency Department: Payer: Medicare Other

## 2018-05-02 ENCOUNTER — Encounter: Payer: Self-pay | Admitting: Emergency Medicine

## 2018-05-02 ENCOUNTER — Observation Stay
Admission: EM | Admit: 2018-05-02 | Discharge: 2018-05-04 | Disposition: A | Discharge status: Home / Self Care | Payer: Medicare Other | Attending: Internal Medicine | Admitting: Internal Medicine

## 2018-05-02 DIAGNOSIS — Z825 Family history of asthma and other chronic lower respiratory diseases: Secondary | ICD-10-CM | POA: Insufficient documentation

## 2018-05-02 DIAGNOSIS — D696 Thrombocytopenia, unspecified: Secondary | ICD-10-CM | POA: Diagnosis not present

## 2018-05-02 DIAGNOSIS — Z992 Dependence on renal dialysis: Secondary | ICD-10-CM | POA: Insufficient documentation

## 2018-05-02 DIAGNOSIS — E785 Hyperlipidemia, unspecified: Secondary | ICD-10-CM | POA: Insufficient documentation

## 2018-05-02 DIAGNOSIS — N2581 Secondary hyperparathyroidism of renal origin: Secondary | ICD-10-CM | POA: Diagnosis not present

## 2018-05-02 DIAGNOSIS — R079 Chest pain, unspecified: Secondary | ICD-10-CM

## 2018-05-02 DIAGNOSIS — R600 Localized edema: Secondary | ICD-10-CM | POA: Diagnosis not present

## 2018-05-02 DIAGNOSIS — R0603 Acute respiratory distress: Secondary | ICD-10-CM | POA: Diagnosis not present

## 2018-05-02 DIAGNOSIS — I509 Heart failure, unspecified: Secondary | ICD-10-CM | POA: Insufficient documentation

## 2018-05-02 DIAGNOSIS — I82621 Acute embolism and thrombosis of deep veins of right upper extremity: Secondary | ICD-10-CM | POA: Diagnosis not present

## 2018-05-02 DIAGNOSIS — S40029A Contusion of unspecified upper arm, initial encounter: Secondary | ICD-10-CM | POA: Diagnosis not present

## 2018-05-02 DIAGNOSIS — N186 End stage renal disease: Secondary | ICD-10-CM | POA: Diagnosis not present

## 2018-05-02 DIAGNOSIS — I10 Essential (primary) hypertension: Secondary | ICD-10-CM | POA: Diagnosis not present

## 2018-05-02 DIAGNOSIS — I132 Hypertensive heart and chronic kidney disease with heart failure and with stage 5 chronic kidney disease, or end stage renal disease: Secondary | ICD-10-CM | POA: Insufficient documentation

## 2018-05-02 DIAGNOSIS — Z79899 Other long term (current) drug therapy: Secondary | ICD-10-CM | POA: Diagnosis not present

## 2018-05-02 DIAGNOSIS — M7989 Other specified soft tissue disorders: Secondary | ICD-10-CM

## 2018-05-02 DIAGNOSIS — J449 Chronic obstructive pulmonary disease, unspecified: Secondary | ICD-10-CM | POA: Diagnosis not present

## 2018-05-02 DIAGNOSIS — Z888 Allergy status to other drugs, medicaments and biological substances status: Secondary | ICD-10-CM | POA: Insufficient documentation

## 2018-05-02 DIAGNOSIS — Z7982 Long term (current) use of aspirin: Secondary | ICD-10-CM | POA: Diagnosis not present

## 2018-05-02 DIAGNOSIS — D631 Anemia in chronic kidney disease: Secondary | ICD-10-CM | POA: Diagnosis not present

## 2018-05-02 DIAGNOSIS — Z8249 Family history of ischemic heart disease and other diseases of the circulatory system: Secondary | ICD-10-CM | POA: Diagnosis not present

## 2018-05-02 DIAGNOSIS — R609 Edema, unspecified: Secondary | ICD-10-CM

## 2018-05-02 DIAGNOSIS — R0789 Other chest pain: Principal | ICD-10-CM | POA: Insufficient documentation

## 2018-05-02 LAB — BASIC METABOLIC PANEL
Anion gap: 13 (ref 5–15)
BUN: 42 mg/dL — ABNORMAL HIGH (ref 6–20)
CALCIUM: 8.5 mg/dL — AB (ref 8.9–10.3)
CO2: 30 mmol/L (ref 22–32)
Chloride: 97 mmol/L — ABNORMAL LOW (ref 98–111)
Creatinine, Ser: 14.87 mg/dL — ABNORMAL HIGH (ref 0.61–1.24)
GFR calc Af Amer: 4 mL/min — ABNORMAL LOW (ref >60)
GFR, EST NON AFRICAN AMERICAN: 3 mL/min — AB (ref >60)
GLUCOSE: 96 mg/dL (ref 70–99)
Potassium: 4.1 mmol/L (ref 3.5–5.1)
SODIUM: 140 mmol/L (ref 135–145)

## 2018-05-02 LAB — CBC
HCT: 25 % — ABNORMAL LOW (ref 40.0–52.0)
Hemoglobin: 8.4 g/dL — ABNORMAL LOW (ref 13.0–18.0)
MCH: 28.9 pg (ref 26.0–34.0)
MCHC: 33.4 g/dL (ref 32.0–36.0)
MCV: 86.6 fL (ref 80.0–100.0)
Platelets: 136 10*3/uL — ABNORMAL LOW (ref 150–440)
RBC: 2.89 MIL/uL — ABNORMAL LOW (ref 4.40–5.90)
RDW: 17.2 % — ABNORMAL HIGH (ref 11.5–14.5)
WBC: 4 10*3/uL (ref 3.8–10.6)

## 2018-05-02 LAB — TROPONIN I: Troponin I: <0.03 ng/mL (ref <0.03)

## 2018-05-02 MED ORDER — ONDANSETRON HCL 4 MG PO TABS: 4.0000 mg | ORAL_TABLET | Freq: Four times a day (QID) | ORAL | Status: DC | PRN

## 2018-05-02 MED ORDER — HEPARIN SOD (PORK) LOCK FLUSH 100 UNIT/ML IV SOLN
INTRAVENOUS | Status: AC
  Filled 2018-05-02: qty 5

## 2018-05-02 MED ORDER — DOCUSATE SODIUM 100 MG PO CAPS
100.0000 mg | ORAL_CAPSULE | Freq: Two times a day (BID) | ORAL | Status: DC
  Filled 2018-05-02 (×3): qty 1

## 2018-05-02 MED ORDER — SIMVASTATIN 5 MG PO TABS
5.0000 mg | ORAL_TABLET | Freq: Every day | ORAL | Status: DC
  Administered 2018-05-03 (×2): 5 mg via ORAL
  Filled 2018-05-02 (×2): qty 0.5

## 2018-05-02 MED ORDER — HYDROMORPHONE HCL 1 MG/ML IJ SOLN
0.5000 mg | INTRAMUSCULAR | Status: DC | PRN
  Administered 2018-05-02 – 2018-05-03 (×5): 0.5 mg via INTRAVENOUS
  Filled 2018-05-02 (×5): qty 1

## 2018-05-02 MED ORDER — HEPARIN SODIUM (PORCINE) 5000 UNIT/ML IJ SOLN
5000.0000 [IU] | Freq: Three times a day (TID) | INTRAMUSCULAR | Status: DC
  Filled 2018-05-02: qty 1

## 2018-05-02 MED ORDER — AMLODIPINE BESYLATE 10 MG PO TABS
10.0000 mg | ORAL_TABLET | Freq: Every day | ORAL | Status: DC
  Administered 2018-05-03 – 2018-05-04 (×2): 10 mg via ORAL
  Filled 2018-05-02: qty 2
  Filled 2018-05-02: qty 1

## 2018-05-02 MED ORDER — ASPIRIN EC 81 MG PO TBEC
81.0000 mg | DELAYED_RELEASE_TABLET | Freq: Every day | ORAL | Status: DC
  Administered 2018-05-03 – 2018-05-04 (×2): 81 mg via ORAL
  Filled 2018-05-02 (×2): qty 1

## 2018-05-02 MED ORDER — IOPAMIDOL (ISOVUE-370) INJECTION 76%
75.0000 mL | Freq: Once | INTRAVENOUS | Status: AC | PRN
  Administered 2018-05-02: 75 mL via INTRAVENOUS

## 2018-05-02 MED ORDER — FERROUS SULFATE 325 (65 FE) MG PO TABS
325.0000 mg | ORAL_TABLET | Freq: Three times a day (TID) | ORAL | Status: DC
  Administered 2018-05-03 – 2018-05-04 (×3): 325 mg via ORAL
  Filled 2018-05-02 (×3): qty 1

## 2018-05-02 MED ORDER — HEPARIN SODIUM (PORCINE) 5000 UNIT/ML IJ SOLN
INTRAMUSCULAR | Status: AC
  Administered 2018-05-02: 5000 [IU] via ARTERIOVENOUS_FISTULA
  Filled 2018-05-02: qty 1

## 2018-05-02 MED ORDER — HYDRALAZINE HCL 25 MG PO TABS
25.0000 mg | ORAL_TABLET | Freq: Three times a day (TID) | ORAL | Status: DC
  Administered 2018-05-03 – 2018-05-04 (×4): 25 mg via ORAL
  Filled 2018-05-02 (×4): qty 1

## 2018-05-02 MED ORDER — ONDANSETRON HCL 4 MG/2ML IJ SOLN
4.0000 mg | Freq: Once | INTRAMUSCULAR | Status: AC
  Administered 2018-05-02: 4 mg via INTRAVENOUS
  Filled 2018-05-02: qty 2

## 2018-05-02 MED ORDER — ACETAMINOPHEN 325 MG PO TABS
650.0000 mg | ORAL_TABLET | Freq: Four times a day (QID) | ORAL | Status: DC | PRN
  Administered 2018-05-03: 650 mg via ORAL
  Filled 2018-05-02: qty 2

## 2018-05-02 MED ORDER — HYDROCODONE-ACETAMINOPHEN 5-325 MG PO TABS
1.0000 | ORAL_TABLET | Freq: Four times a day (QID) | ORAL | Status: DC | PRN
  Filled 2018-05-02: qty 1
  Filled 2018-05-02: qty 2

## 2018-05-02 MED ORDER — BENZONATATE 100 MG PO CAPS: 100.0000 mg | ORAL_CAPSULE | Freq: Two times a day (BID) | ORAL | Status: DC | PRN

## 2018-05-02 MED ORDER — CALCITRIOL 0.25 MCG PO CAPS
0.5000 ug | ORAL_CAPSULE | ORAL | Status: DC
  Filled 2018-05-02: qty 2

## 2018-05-02 MED ORDER — CARVEDILOL 25 MG PO TABS
25.0000 mg | ORAL_TABLET | Freq: Two times a day (BID) | ORAL | Status: DC
  Administered 2018-05-03 – 2018-05-04 (×3): 25 mg via ORAL
  Filled 2018-05-02 (×3): qty 1

## 2018-05-02 MED ORDER — MORPHINE SULFATE (PF) 4 MG/ML IV SOLN
4.0000 mg | Freq: Once | INTRAVENOUS | Status: AC
  Administered 2018-05-02: 4 mg via INTRAVENOUS
  Filled 2018-05-02: qty 1

## 2018-05-02 MED ORDER — HYDROCODONE-ACETAMINOPHEN 5-325 MG PO TABS: 1.0000 | ORAL_TABLET | ORAL | Status: DC | PRN

## 2018-05-02 MED ORDER — ACETAMINOPHEN 650 MG RE SUPP
650.0000 mg | Freq: Four times a day (QID) | RECTAL | Status: DC | PRN
Start: 2018-05-02 — End: 2018-05-04

## 2018-05-02 MED ORDER — MESALAMINE 400 MG PO CPDR
800.0000 mg | DELAYED_RELEASE_CAPSULE | Freq: Three times a day (TID) | ORAL | Status: DC
  Administered 2018-05-03 – 2018-05-04 (×4): 800 mg via ORAL
  Filled 2018-05-02 (×7): qty 2

## 2018-05-02 MED ORDER — BISACODYL 5 MG PO TBEC: 5.0000 mg | DELAYED_RELEASE_TABLET | Freq: Every day | ORAL | Status: DC | PRN

## 2018-05-02 MED ORDER — ONDANSETRON HCL 4 MG/2ML IJ SOLN: 4.0000 mg | Freq: Four times a day (QID) | INTRAMUSCULAR | Status: DC | PRN

## 2018-05-02 MED ORDER — CALCIUM ACETATE (PHOS BINDER) 667 MG PO CAPS
667.0000 mg | ORAL_CAPSULE | Freq: Three times a day (TID) | ORAL | Status: DC
  Administered 2018-05-03 – 2018-05-04 (×3): 667 mg via ORAL
  Filled 2018-05-02 (×3): qty 1

## 2018-05-02 MED ORDER — LOSARTAN POTASSIUM 50 MG PO TABS
100.0000 mg | ORAL_TABLET | Freq: Every day | ORAL | Status: DC
Start: 2018-05-03 — End: 2018-05-04
  Administered 2018-05-03 – 2018-05-04 (×2): 100 mg via ORAL
  Filled 2018-05-02 (×2): qty 2

## 2018-05-02 NOTE — ED Notes (Addendum)
Tedra Coupe, RN attempted IV access x1. Pt pulled back instantly, stated "that's not a vein. I can't take this." Unsuccessful attempt.

## 2018-05-02 NOTE — H&P (Signed)
[name]    MR#:  [id number]  DATE OF BIRTH:  09-25-1966  DATE OF ADMISSION:  05/02/2018  PRIMARY CARE PHYSICIAN: Tresa Garter, MD   REQUESTING/REFERRING PHYSICIAN:   CHIEF COMPLAINT:   Chief Complaint  Patient presents with  . Chest Pain    HISTORY OF PRESENT ILLNESS: Jonathon Conley  is a 52 y.o. male with a known history of end-stage renal disease, COPD, CHF, hypertension, hyperlipidemia, OSA and other comorbidities. Patient presented to emergency room for chest pain and right upper extremity swelling.  Chest pain is retrosternal, severe and sharp in nature; it radiates through to his back.  Per patient, chest pain is worse with deep inspiration.  No fever, shortness of breath, cough. Right arm swelling has been noted in the past 3 days, gradually getting worse.  Patient is status post recent HERO graft revision on the right arm, done by the vascular team on 04/25/2018.  She denies any recent falls or trauma.  Blood test done emergency room are remarkable for hemoglobin level at 8.4, creatinine level of 14.87, troponin level less than 0.03.  Patient skipped his dialysis session yesterday, due to not feeling well. Chest x-ray, reviewed by myself is negative for any acute abnormalities.  EKG shows normal sinus rhythm with heart rate at 85 bpm, no acute ischemic changes. Patient is admitted for further evaluation and treatment.   PAST MEDICAL HISTORY:   Past Medical History:  Diagnosis Date  . Anemia   . Asthma    childhood  . Blood transfusion without reported diagnosis   . CHF (congestive heart failure) ([place])   . Chronic kidney disease    Dialysis Mon, Wed, Fri  . Colitis   . COPD (chronic obstructive pulmonary disease) ([place])   . Diverticulosis 04/12/16   also seen: sigmoid and rectal erythema, path:   Marland Kitchen Dyspnea   . Heart murmur   . Hemorrhoids 03/2016   bleeding at 04/12/16 colonoscopy,  treated with  APC laser. cauterization  . Hyperlipidemia   . Hypertension   . OSA (obstructive sleep apnea) 03/27/2013    PAST SURGICAL HISTORY:  Past Surgical History:  Procedure Laterality Date  . APPLICATION OF WOUND VAC Right 12/13/2016   Procedure: APPLICATION OF WOUND VAC;  Surgeon: Algernon Huxley, MD;  Location: [facility] ORS;  Service: Vascular;  Laterality: Right;  . APPLICATION OF WOUND VAC Right 12/19/2016   Procedure: APPLICATION OF WOUND VAC;  Surgeon: Algernon Huxley, MD;  Location: [facility] OR[facility];  Service: Vascular;  Laterality: Right;  . COLONOSCOPY N/A 04/12/2016   Procedure: COLONOSCOPY;  Surgeon: Mauri Pole, MD;  Location: [facility];  Service: Endoscopy;  Laterality: N/A;  . DIALYSIS/PERMA CATHETER INSERTION N/A 02/26/2017   Procedure: Dialysis/Perma Catheter Insertion;  Surgeon: Algernon Huxley, MD;  Location: [place] [facility];  Service: Cardiovascular;  Laterality: N/A;  . DIALYSIS/PERMA CATHETER INSERTION N/A 03/26/2018   Procedure: DIALYSIS/PERMA CATHETER INSERTION;  Surgeon: Katha Cabal, MD;  Location: [place] CV LAB;  Service: Cardiovascular;  Laterality: N/A;  . EXCHANGE OF A DIALYSIS CATHETER  11/20/2016   Procedure: Exchange Of A Dialysis Catheter;  Surgeon: Algernon Huxley, MD;  Location: [place] [facility];  Service: Cardiovascular;;  . fistulagrams Right   . HEMATOMA EVACUATION Right 12/13/2016   Procedure: EVACUATION HEMATOMA;  Surgeon: Algernon Huxley, MD;  Location: [facility];  Service: Vascular;  Laterality: Right;  . HEMATOMA EVACUATION  Right 12/19/2016   Procedure: EVACUATIONof seroma;  Surgeon: Algernon Huxley, MD;  Location: [facility];  Service: Vascular;  Laterality: Right;  . HOT HEMOSTASIS N/A 04/12/2016   Procedure: HOT HEMOSTASIS (ARGON PLASMA COAGULATION/BICAP);  Surgeon: Mauri Pole, MD;  Location: [facility];  Service: Endoscopy;  Laterality: N/A;  . KIDNEY TRANSPLANT    . LIGATIONS OF HERO GRAFT Right 04/25/2018   Procedure: LIGATIONS OF HERO GRAFT  ( REVISION );  Surgeon: Algernon Huxley, MD;  Location: [facility] OR[facility];  Service: Vascular;  Laterality: Right;  . PERIPHERAL VASCULAR CATHETERIZATION Bilateral 10/02/2016   Procedure: Upper Extremity Venography;  Surgeon: Algernon Huxley, MD;  Location: [place] CV LAB;  Service: Cardiovascular;  Laterality: Bilateral;  . PERIPHERAL VASCULAR CATHETERIZATION Right 11/08/2016   Procedure: DIALYSIS/PERMA CATHETER REMOVAL right Jugular;  Surgeon: Algernon Huxley, MD;  Location: [facility];  Service: Vascular;  Laterality: Right;  . PERIPHERAL VASCULAR CATHETERIZATION Left 11/08/2016   Procedure: DIALYSIS/PERMA CATHETER INSERTION left jugular with u/s guide and flouroscan;  Surgeon: Algernon Huxley, MD;  Location: [facility];  Service: Vascular;  Laterality: Left;  . PERIPHERAL VASCULAR CATHETERIZATION N/A 11/08/2016   Procedure: IVC FILTER INSERTION;  Surgeon: Algernon Huxley, MD;  Location: [facility];  Service: Vascular;  Laterality: N/A;  . perm a cath in Rt upper chest Right   . THORACENTESIS    . VASCULAR ACCESS DEVICE INSERTION Right 11/08/2016   Procedure: INSERTION OF HERO VASCULAR ACCESS DEVICE ( GRAFT );  Surgeon: Algernon Huxley, MD;  Location: [facility] ORS;  Service: Vascular;  Laterality: Right;    SOCIAL HISTORY:  Social History   Tobacco Use  . Smoking status: Never Smoker  . Smokeless tobacco: Never Used  Substance Use Topics  . Alcohol use: No    FAMILY HISTORY:  Family History  Problem Relation Age of Onset  . Heart disease Mother   . Cancer Mother        ovarian  . Cancer Maternal Grandmother   . Cancer Maternal Grandfather   . Asthma Son   . Asthma Son     DRUG ALLERGIES:  Allergies  Allergen Reactions  . Lisinopril Swelling    Facial swelling    REVIEW OF SYSTEMS:   CONSTITUTIONAL: No fever, fatigue or weakness.  EYES: No blurred or double vision.  EARS, NOSE, AND THROAT: No tinnitus or ear pain.  RESPIRATORY: No cough, shortness of breath, wheezing or hemoptysis.  CARDIOVASCULAR:  Positive for chest pain, no orthopnea.  GASTROINTESTINAL: No nausea, vomiting, diarrhea or abdominal pain.  GENITOURINARY: No dysuria, hematuria.  ENDOCRINE: No polyuria, nocturia,  HEMATOLOGY: No anemia, easy bruising or bleeding SKIN: No rash or lesion. MUSCULOSKELETAL: Positive for right upper extremity edema.   NEUROLOGIC: No focal weakness.  PSYCHIATRY: No anxiety or depression.   MEDICATIONS AT HOME:  Prior to Admission medications   Medication Sig Start Date End Date Taking? Authorizing Provider  amLODipine (NORVASC) 10 MG tablet Take 10 mg by mouth daily.    [provider]  aspirin EC 81 MG tablet Take 81 mg by mouth daily.     [provider]  benzonatate (TESSALON) 100 MG capsule Take 100 mg by mouth 2 (two) times daily as needed for cough. 03/08/18   [provider]  calcitRIOL (ROCALTROL) 0.5 MCG capsule Take 1 capsule (0.5 mcg total) by mouth Every Tuesday,Thursday,and Saturday with dialysis. 09/24/15   Orson Eva, MD  calcium acetate (PHOSLO) 667 MG capsule Take 667 mg by mouth  3 (three) times daily with meals.    [provider]  carvedilol (COREG) 25 MG tablet Take 25 mg by mouth 2 (two) times daily.     [provider]  ferrous sulfate 325 (65 FE) MG tablet Take 325 mg by mouth 3 (three) times daily with meals.     [provider]  hydrALAZINE (APRESOLINE) 25 MG tablet Take 1 tablet (25 mg total) by mouth 3 (three) times daily. 04/11/18   Demetrios Loll, MD  HYDROcodone-acetaminophen [facility]) 5-325 MG tablet Take 1-2 tablets by mouth every 6 (six) hours as needed. 04/25/18   Algernon Huxley, MD  losartan (COZAAR) 100 MG tablet Take 1 tablet (100 mg total) by mouth daily. 04/11/18   Demetrios Loll, MD  Mesalamine 800 MG TBEC Take 1 tablet (800 mg total) by mouth 3 (three) times daily. 05/03/16   Esterwood, Amy S, PA-C  simvastatin (ZOCOR) 5 MG tablet Take 1 tablet (5 mg total) by mouth at bedtime. 02/17/15   Ghimire, Henreitta Leber, MD       PHYSICAL EXAMINATION:   VITAL SIGNS: Blood pressure (!) 171/94, pulse 85, temperature 98.4 F (36.9 C), temperature source Oral, resp. rate 18, height 5' 10"  (1.778 m), weight 81.4 kg (179 lb 8 oz), SpO2 100 %.  GENERAL:  52 y.o.-year-old patient lying in the bed with moderate acute distress, secondary to chest pain.  EYES: Pupils equal, round, reactive to light and accommodation. No scleral icterus.  HEENT: Head atraumatic, normocephalic. Oropharynx and nasopharynx clear.  NECK:  Supple, no jugular venous distention. No thyroid enlargement, no tenderness.  LUNGS: Reduced breath sounds bilaterally, no wheezing. No use of accessory muscles of respiration.  CARDIOVASCULAR: S1, S2 normal. No murmurs, rubs, or gallops.  ABDOMEN: Soft, nontender, nondistended. Bowel sounds present. No organomegaly or mass.  EXTREMITIES: No pedal edema, cyanosis, or clubbing.  NEUROLOGIC: No focal weakness. PSYCHIATRIC: The patient is alert and oriented x 3.  SKIN: No obvious rash, lesion, or ulcer.   LABORATORY PANEL:   CBC Recent Labs  Lab 05/02/18 1902  WBC 4.0  HGB 8.4*  HCT 25.0*  PLT 136*  MCV 86.6  MCH 28.9  MCHC 33.4  RDW 17.2*   ------------------------------------------------------------------------------------------------------------------  Chemistries  Recent Labs  Lab 05/02/18 1902  NA 140  K 4.1  CL 97*  CO2 30  GLUCOSE 96  BUN 42*  CREATININE 14.87*  CALCIUM 8.5*   ------------------------------------------------------------------------------------------------------------------ estimated creatinine clearance is 6 mL/min (A) (by C-G formula based on SCr of 14.87 mg/dL (H)). ------------------------------------------------------------------------------------------------------------------ No results for input(s): TSH, T4TOTAL, T3FREE, THYROIDAB in the last 72 hours.  Invalid input(s): FREET3   Coagulation profile No results for input(s): INR, PROTIME in the last 168  hours. ------------------------------------------------------------------------------------------------------------------- No results for input(s): DDIMER in the last 72 hours. -------------------------------------------------------------------------------------------------------------------  Cardiac Enzymes Recent Labs  Lab 05/02/18 1902  TROPONINI <0.03   ------------------------------------------------------------------------------------------------------------------ Invalid input(s): POCBNP  ---------------------------------------------------------------------------------------------------------------  Urinalysis    Component Value Date/Time   COLORURINE YELLOW 02/14/2015 2213   APPEARANCEUR CLEAR 02/14/2015 2213   LABSPEC 1.015 02/14/2015 2213   PHURINE 5.5 02/14/2015 2213   GLUCOSEU 100 (A) 02/14/2015 2213   HGBUR LARGE (A) 02/14/2015 2213   BILIRUBINUR NEGATIVE 02/14/2015 2213   KETONESUR NEGATIVE 02/14/2015 2213   PROTEINUR >300 (A) 02/14/2015 2213   UROBILINOGEN 0.2 02/14/2015 2213   NITRITE NEGATIVE 02/14/2015 2213   LEUKOCYTESUR NEGATIVE 02/14/2015 2213     RADIOLOGY: Dg Chest 2 View  Result Date: 05/02/2018 CLINICAL DATA:  Chest pain  today EXAM: CHEST - 2 VIEW COMPARISON:  April 09, 2018 FINDINGS: The heart size and mediastinal contours are within normal limits. Bilateral central venous lines are unchanged. Chronic pleural effusions/pleural parenchymal thickening of the right lung base are stable. The left lung is clear. The visualized skeletal structures are unremarkable. IMPRESSION: No active cardiopulmonary disease. Stable chronic changes of right lung. Electronically Signed   By: Abelardo Diesel M.D.   On: 05/02/2018 17:25    EKG: Orders placed or performed during the hospital encounter of 05/02/18  . ED EKG within 10 minutes  . ED EKG within 10 minutes    IMPRESSION AND PLAN:   1.  Chest pain, will rule out ACS and PE.  Continue to monitor patient on  telemetry and follow troponin levels.  Will check CT angio of the chest.  Plan was discussed with nephrology, who scheduled patient for hemodialysis in a.m., after CAT scan.  Continue pain management. 2.  Right upper extremity hematoma, status post recent HERO graft revision on the right arm, done by the vascular team on 04/25/2018.  Vascular surgery team is consulted for further evaluation and treatment.  Continue to monitor hemoglobin level closely and transfuse as needed for hemoglobin lower than 7.   3. ESRD, continue hemodialysis treatment per nephrology. 4.  Anemia of chronic disease.  Hemoglobin level is 8.4, close to baseline.  We will continue to monitor CBC closely. 5.  Thrombocytopenia.  The PLTs count is 136.  Continue to monitor closely. 6.  Hypertension, stable, restart home medications.  All the records are reviewed and case discussed with ED provider. Management plans discussed with the patient, family and they are in agreement.  CODE STATUS: Code Status History    Date Active Date Inactive Code Status Order ID Comments User Context   04/09/2018 1625 04/11/2018 1412 Full Code [id number]  Dustin Flock, MD Inpatient   03/13/2018 0123 03/14/2018 1716 Full Code [id number]  Arta Silence, MD Inpatient   12/18/2016 0150 12/19/2016 2105 Full Code [id number]  Harvie Bridge, DO Inpatient   10/02/2016 1011 10/02/2016 1418 Full Code [id number]  Algernon Huxley, MD Inpatient   04/08/2016 1412 04/12/2016 1955 Full Code [id number]  Norman Herrlich, MD ED   12/14/2015 2223 12/16/2015 1423 Full Code [id number]  Demetrios Loll, MD Inpatient   11/03/2015 0211 11/04/2015 1739 Full Code [id number]  Lily Kocher, MD Inpatient   09/21/2015 1930 09/24/2015 1613 Full Code [id number]  Vianne Bulls, MD ED   02/14/2015 2341 02/17/2015 1814 Full Code [id number]  Lavina Hamman, MD ED       TOTAL TIME TAKING CARE OF THIS PATIENT: 45 minutes.    Amelia Jo M.D on 05/02/2018 at 9:31 PM  Between 7am to 6pm - Pager -  [phone]  After 6pm go to [email or web address] - password [email or web address]  Office  [phone]  CC: Primary care physician; Tresa Garter, MD

## 2018-05-02 NOTE — ED Provider Notes (Addendum)
[facility] Emergency Department Provider Note ____________________________________________   First MD Initiated Contact with Patient 05/02/18 1742     (approximate)  I have reviewed the triage vital signs and the nursing notes.   HISTORY  Chief Complaint Chest Pain  HPI Jonathon Conley is a 52 y.o. male with history of end-stage renal disease as well as CHF on dialysis was presented to the emergency department today with chest pain that started at 5 AM.  He says the pain is a 7-8 out of 10 and sharp and radiating through to his back.  He says the pain is been constant.  Patient also complaining of swelling to his right upper extremity ever since Monday after dialysis.  Had recent what sounds like fistulogram this past Thursday.  However, is dialyzed out of his left sided chest permacath.  Denying any shortness of breath.  Says that he took blood pressure medication earlier today to help with chest pain but this did not relieve any of his symptoms.  Patient states that he went to dialysis this past Monday but was not able to attend yesterday secondary to "feeling bad."  Past Medical History:  Diagnosis Date  . Anemia   . Asthma    childhood  . Blood transfusion without reported diagnosis   . CHF (congestive heart failure) ([place])   . Chronic kidney disease    Dialysis Mon, Wed, Fri  . Colitis   . COPD (chronic obstructive pulmonary disease) ([place])   . Diverticulosis 04/12/16   also seen: sigmoid and rectal erythema, path:   Marland Kitchen Dyspnea   . Heart murmur   . Hemorrhoids 03/2016   bleeding at 04/12/16 colonoscopy, treated with  APC laser. cauterization  . Hyperlipidemia   . Hypertension   . OSA (obstructive sleep apnea) 03/27/2013    Patient Active Problem List   Diagnosis Date Noted  . Acute CHF (congestive heart failure) ([place]) 04/09/2018  . FUO (fever of unknown origin) 03/13/2018  . Viral respiratory infection 03/05/2018  . Cough 11/14/2017  . Lower  respiratory infection 11/14/2017  . History of fever 11/14/2017  . SIRS (systemic inflammatory response syndrome) ([place]) 12/19/2016  . End stage renal disease ([place]) 12/19/2016  . Hyponatremia 12/19/2016  . Anemia of chronic disease 12/19/2016  . Postprocedural seroma of a musculoskeletal structure following other procedure 12/17/2016  . Hematochezia 04/08/2016  . HTN (hypertension) 04/08/2016  . Intravenous line infection ([place]) 04/08/2016  . Insomnia 01/04/2016  . Malignant hypertension 12/14/2015  . Symptomatic anemia 11/03/2015  . Chronic anemia 11/03/2015  . Thrombocytopenia ([place]) 11/03/2015  . Pleural effusion on right 11/03/2015  . Shortness of breath 11/02/2015  . Sepsis ([place]) 09/23/2015  . ESRD on dialysis ([place]) 09/22/2015  . Accelerated hypertension 09/22/2015  . Leg edema, left   . HCAP (healthcare-associated pneumonia) 09/21/2015  . Bacteremia due to Gram-positive bacteria 08/20/2015  . Acute blood loss anemia 08/12/2015  . C. difficile colitis 08/12/2015  . Bleeding gastrointestinal 08/12/2015  . Acute-on-chronic renal failure ([place]) 08/10/2015  . H/O kidney transplant 08/08/2015  . Non compliance with medical treatment 08/08/2015  . History of anemia 08/08/2015  . AKI (acute kidney injury) ([place]) 02/14/2015  . Renal transplant recipient 02/14/2015  . Acute on chronic diastolic congestive heart failure ([place]) 02/14/2015  . Anemia 02/14/2015  . Hyperlipidemia   . OSA (obstructive sleep apnea) 03/27/2013    Past Surgical History:  Procedure Laterality Date  . APPLICATION OF WOUND VAC Right 12/13/2016   Procedure: APPLICATION  OF WOUND VAC;  Surgeon: Algernon Huxley, MD;  Location: [facility] ORS;  Service: Vascular;  Laterality: Right;  . APPLICATION OF WOUND VAC Right 12/19/2016   Procedure: APPLICATION OF WOUND VAC;  Surgeon: Algernon Huxley, MD;  Location: [facility] ORS;  Service: Vascular;  Laterality: Right;  . COLONOSCOPY N/A 04/12/2016   Procedure: COLONOSCOPY;  Surgeon: Mauri Pole, MD;  Location: [facility];  Service: Endoscopy;  Laterality: N/A;  . DIALYSIS/PERMA CATHETER INSERTION N/A 02/26/2017   Procedure: Dialysis/Perma Catheter Insertion;  Surgeon: Algernon Huxley, MD;  Location: [place] [facility];  Service: Cardiovascular;  Laterality: N/A;  . DIALYSIS/PERMA CATHETER INSERTION N/A 03/26/2018   Procedure: DIALYSIS/PERMA CATHETER INSERTION;  Surgeon: Katha Cabal, MD;  Location: [facility];  Service: Cardiovascular;  Laterality: N/A;  . EXCHANGE OF A DIALYSIS CATHETER  11/20/2016   Procedure: Exchange Of A Dialysis Catheter;  Surgeon: Algernon Huxley, MD;  Location: [facility];  Service: Cardiovascular;;  . fistulagrams Right   . HEMATOMA EVACUATION Right 12/13/2016   Procedure: EVACUATION HEMATOMA;  Surgeon: Algernon Huxley, MD;  Location: [facility] ORS;  Service: Vascular;  Laterality: Right;  . HEMATOMA EVACUATION Right 12/19/2016   Procedure: EVACUATIONof seroma;  Surgeon: Algernon Huxley, MD;  Location: [facility] OR[facility];  Service: Vascular;  Laterality: Right;  . HOT HEMOSTASIS N/A 04/12/2016   Procedure: HOT HEMOSTASIS (ARGON PLASMA COAGULATION/BICAP);  Surgeon: Mauri Pole, MD;  Location[facility] ENDOSCOPY;  Service: Endoscopy;  Laterality: N/A;  . KIDNEY TRANSPLANT    . LIGATIONS OF HERO GRAFT Right 04/25/2018   Procedure: LIGATIONS OF HERO GRAFT ( REVISION );  Surgeon: Algernon Huxley, MD;  Location: [facility] ORS;  Service: Vascular;  Laterality: Right;  . PERIPHERAL VASCULAR CATHETERIZATION Bilateral 10/02/2016   Procedure: Upper Extremity Venography;  Surgeon: Algernon Huxley, MD;  Location: [place] [facility];  Service: Cardiovascular;  Laterality: Bilateral;  . PERIPHERAL VASCULAR CATHETERIZATION Right 11/08/2016   Procedure: DIALYSIS/PERMA CATHETER REMOVAL right Jugular;  Surgeon: Algernon Huxley, MD;  Location: [facility] ORS;  Service: Vascular;  Laterality: Right;  . PERIPHERAL VASCULAR CATHETERIZATION Left 11/08/2016   Procedure: DIALYSIS/PERMA CATHETER INSERTION  left jugular with u/s guide and flouroscan;  Surgeon: Algernon Huxley, MD;  Location: [facility] ORS;  Service: Vascular;  Laterality: Left;  . PERIPHERAL VASCULAR CATHETERIZATION N/A 11/08/2016   Procedure: IVC FILTER INSERTION;  Surgeon: Algernon Huxley, MD;  Location: [facility] ORS;  Service: Vascular;  Laterality: N/A;  . perm a cath in Rt upper chest Right   . THORACENTESIS    . VASCULAR ACCESS DEVICE INSERTION Right 11/08/2016   Procedure: INSERTION OF HERO VASCULAR ACCESS DEVICE ( GRAFT );  Surgeon: Algernon Huxley, MD;  Location: [facility] ORS;  Service: Vascular;  Laterality: Right;    Prior to Admission medications   Medication Sig Start Date End Date Taking? Authorizing Provider  amLODipine (NORVASC) 10 MG tablet Take 10 mg by mouth daily.    [provider]  aspirin EC 81 MG tablet Take 81 mg by mouth daily.     [provider]  benzonatate (TESSALON) 100 MG capsule Take 100 mg by mouth 2 (two) times daily as needed for cough. 03/08/18   [provider]  calcitRIOL (ROCALTROL) 0.5 MCG capsule Take 1 capsule (0.5 mcg total) by mouth Every Tuesday,Thursday,and Saturday with dialysis. 09/24/15   Orson Eva, MD  calcium acetate (PHOSLO) 667 MG capsule Take 667 mg by mouth 3 (three) times daily  with meals.    [provider]  carvedilol (COREG) 25 MG tablet Take 25 mg by mouth 2 (two) times daily.     [provider]  ferrous sulfate 325 (65 FE) MG tablet Take 325 mg by mouth 3 (three) times daily with meals.     [provider]  hydrALAZINE (APRESOLINE) 25 MG tablet Take 1 tablet (25 mg total) by mouth 3 (three) times daily. 04/11/18   Demetrios Loll, MD  HYDROcodone-acetaminophen [facility]) 5-325 MG tablet Take 1-2 tablets by mouth every 6 (six) hours as needed. 04/25/18   Algernon Huxley, MD  losartan (COZAAR) 100 MG tablet Take 1 tablet (100 mg total) by mouth daily. 04/11/18   Demetrios Loll, MD  Mesalamine 800 MG TBEC Take 1 tablet (800 mg total) by mouth 3 (three) times daily.  05/03/16   Esterwood, Amy S, PA-C  simvastatin (ZOCOR) 5 MG tablet Take 1 tablet (5 mg total) by mouth at bedtime. 02/17/15   GhimireHenreitta Leber, MD    Allergies Lisinopril  Family History  Problem Relation Age of Onset  . Heart disease Mother   . Cancer Mother        ovarian  . Cancer Maternal Grandmother   . Cancer Maternal Grandfather   . Asthma Son   . Asthma Son     Social History Social History   Tobacco Use  . Smoking status: Never Smoker  . Smokeless tobacco: Never Used  Substance Use Topics  . Alcohol use: No  . Drug use: No    Review of Systems  Constitutional: No fever/chills Eyes: No visual changes. ENT: No sore throat. Cardiovascular: As above Respiratory: Denies shortness of breath. Gastrointestinal: No abdominal pain.  No nausea, no vomiting.  No diarrhea.  No constipation. Genitourinary: Negative for dysuria. Musculoskeletal: As above Skin: Negative for rash. Neurological: Negative for headaches, focal weakness or numbness.   ____________________________________________   PHYSICAL EXAM:  VITAL SIGNS: ED Triage Vitals  Enc Vitals Group     BP 05/02/18 1653 (!) 156/94     Pulse Rate 05/02/18 1653 83     Resp 05/02/18 1653 18     Temp 05/02/18 1653 98.4 F (36.9 C)     Temp Source 05/02/18 1653 Oral     SpO2 05/02/18 1653 99 %     Weight 05/02/18 1652 179 lb 8 oz (81.4 kg)     Height 05/02/18 1652 5' 10"  (1.778 m)     Head Circumference --      Peak Flow --      Pain Score 05/02/18 1652 8     Pain Loc --      Pain Edu? --      Excl. in [place]? --     Constitutional: Alert and oriented. Well appearing and in no acute distress. Eyes: Conjunctivae are normal.  Head: Atraumatic. Nose: No congestion/rhinnorhea. Mouth/Throat: Mucous membranes are moist.  Neck: No stridor.   Cardiovascular: Normal rate, regular rhythm. Grossly normal heart sounds.  Good peripheral circulation with palpable thrill over the right upper extremity fistula.   Left-sided chest permacath in place with dressing CDI. Respiratory: Normal respiratory effort.  No retractions. Lungs CTAB. Gastrointestinal: Soft and nontender. No distention. No CVA tenderness. Musculoskeletal: No lower extremity tenderness nor edema.  No joint effusions.  Right upper extremity fistula with very small area of dehiscence of about 1 cm without active bleeding, no surrounding erythema, induration.  No exudate.  Thrill palpated to the fistula.  Also with  moderate to severe swelling of the right upper extremity around the fistula at the arm but with minimal swelling to just the proximal forearm.  No warmth or erythema.  Neurologic:  Normal speech and language. No gross focal neurologic deficits are appreciated. Skin:  Skin is warm, dry and intact. No rash noted. Psychiatric: Mood and affect are normal. Speech and behavior are normal.  ____________________________________________   LABS (all labs ordered are listed, but only abnormal results are displayed)  Labs Reviewed  BASIC METABOLIC PANEL - Abnormal; Notable for the following components:      Result Value   Chloride 97 (*)    BUN 42 (*)    Creatinine, Ser 14.87 (*)    Calcium 8.5 (*)    GFR calc non Af Amer 3 (*)    GFR calc Af Amer 4 (*)    All other components within normal limits  CBC - Abnormal; Notable for the following components:   RBC 2.89 (*)    Hemoglobin 8.4 (*)    HCT 25.0 (*)    RDW 17.2 (*)    Platelets 136 (*)    All other components within normal limits  TROPONIN I   ____________________________________________  EKG  ED ECG REPORT I, Doran Stabler, the attending physician, personally viewed and interpreted this ECG.   Date: 05/02/2018  EKG Time: 1649  Rate: 85  Rhythm: normal sinus rhythm  Axis: Normal  Intervals:none  ST&T Change: No ST segment elevation.  Minimal depression in V5 and V6 with T wave inversions in V5 and V6.  No significant change from  previous.  ____________________________________________  RADIOLOGY  No acute finding on the chest x-ray. ____________________________________________   PROCEDURES  Procedure(s) performed:   Procedures  Critical Care performed:   ____________________________________________   INITIAL IMPRESSION / ASSESSMENT AND PLAN / ED COURSE  Pertinent labs & imaging results that were available during my care of the patient were reviewed by me and considered in my medical decision making (see chart for details).  DDX: Clogged fistula, muscular skeletal chest pain, hyperkalemia, CHF, ACS, unstable angina As part of my medical decision making, I reviewed the following data within the Register Notes from prior ED visits   ----------------------------------------- 9:04 PM on 05/02/2018 -----------------------------------------  Patient at this time still with persistent chest pain despite morphine.  Reassuring lab work as well as unchanged EKG.  Says that the pain hurts worse with deep breathing.  I discussed the case with Dr. Juleen China and we will be doing a CT angiography.  The patient will be admitted and will be dialyzed tomorrow.  Patient will be observed for chest pain as well as requiring a vascular consultation for his right upper extremity swelling.  Patient and family aware of need for admission and willing to comply.  Signed out to Dr. Duane Boston.   ____________________________________________   FINAL CLINICAL IMPRESSION(S) / ED DIAGNOSES  Chest pain.  Right upper extremity edema   NEW MEDICATIONS STARTED DURING THIS VISIT:  New Prescriptions   No medications on file     Note:  This document was prepared using Dragon voice recognition software and may include unintentional dictation errors.     Orbie Pyo, MD 05/02/18 2105  Angiocath insertion Performed by: Doran Stabler  Consent: Verbal consent obtained. Risks and benefits: risks,  benefits and alternatives were discussed Time out: Immediately prior to procedure a "time out" was called to verify the correct patient, procedure, equipment, support staff and site/side  marked as required.  Preparation: Patient was prepped and draped in the usual sterile fashion.  Vein Location:  Left basilic  Ultrasound Guided  Gauge: 18  Normal blood return and flush without difficulty Patient tolerance: Patient tolerated the procedure well with no immediate complications.  Initially tried right-sided EJ but was unsuccessful.  Patient requiring line for CT angiography.  Dialysis port was accessed by IV team previously for blood draw and IV access.  However we cannot inject the contrast for the CTA through the patient's dialysis port.     Orbie Pyo, MD 05/02/18 [phone]

## 2018-05-02 NOTE — ED Notes (Addendum)
Pt to the er for chest pain and nausea. Pt is a dialysis pt and had surgery last week with DR Lucky Cowboy, to try and unclot graft in the right arm so they could stop using the chest port at  Dialysis. Pt did not go to dialysis yesterday due to feeling bad. Monday was the last day he went. Pt has bleeding to the right upper arm at the incision site. Pt has changed the dressing multiple times today. Pt has gross swelling to the right arm. Pt called surgeon and was told to come to the ER for pain.

## 2018-05-02 NOTE — ED Notes (Signed)
Unable to give report. RN on floor will call back.

## 2018-05-02 NOTE — ED Notes (Signed)
Multiple attempts by 3 nurses for iv unsuccessful. [place] used as well.

## 2018-05-02 NOTE — ED Triage Notes (Signed)
Pt arrived with complaints of chest pain that started today. Pt states the pain has been intermittent and feels sharp. Pt received dialysis MWF but did not go yesterday stating "I was feeling bad and didn't go."

## 2018-05-02 NOTE — ED Notes (Signed)
Patient is resting comfortably. 

## 2018-05-02 NOTE — ED Notes (Signed)
Family at bedside. 

## 2018-05-02 NOTE — ED Notes (Signed)
Attempted to stick twice in triage no success.

## 2018-05-03 ENCOUNTER — Observation Stay: Payer: Medicare Other

## 2018-05-03 DIAGNOSIS — R079 Chest pain, unspecified: Secondary | ICD-10-CM | POA: Diagnosis not present

## 2018-05-03 DIAGNOSIS — N2581 Secondary hyperparathyroidism of renal origin: Secondary | ICD-10-CM | POA: Diagnosis not present

## 2018-05-03 DIAGNOSIS — N186 End stage renal disease: Secondary | ICD-10-CM | POA: Diagnosis not present

## 2018-05-03 DIAGNOSIS — R600 Localized edema: Secondary | ICD-10-CM | POA: Diagnosis not present

## 2018-05-03 DIAGNOSIS — I120 Hypertensive chronic kidney disease with stage 5 chronic kidney disease or end stage renal disease: Secondary | ICD-10-CM | POA: Diagnosis not present

## 2018-05-03 DIAGNOSIS — D631 Anemia in chronic kidney disease: Secondary | ICD-10-CM | POA: Diagnosis not present

## 2018-05-03 DIAGNOSIS — I10 Essential (primary) hypertension: Secondary | ICD-10-CM | POA: Diagnosis not present

## 2018-05-03 DIAGNOSIS — S40029A Contusion of unspecified upper arm, initial encounter: Secondary | ICD-10-CM | POA: Diagnosis not present

## 2018-05-03 LAB — CBC
HEMATOCRIT: 23.3 % — AB (ref 40.0–52.0)
HEMOGLOBIN: 7.9 g/dL — AB (ref 13.0–18.0)
MCH: 29.3 pg (ref 26.0–34.0)
MCHC: 33.8 g/dL (ref 32.0–36.0)
MCV: 86.7 fL (ref 80.0–100.0)
Platelets: 137 10*3/uL — ABNORMAL LOW (ref 150–440)
RBC: 2.69 MIL/uL — AB (ref 4.40–5.90)
RDW: 17.7 % — AB (ref 11.5–14.5)
WBC: 3.9 10*3/uL (ref 3.8–10.6)

## 2018-05-03 LAB — RENAL FUNCTION PANEL
ALBUMIN: 3.4 g/dL — AB (ref 3.5–5.0)
ANION GAP: 13 (ref 5–15)
BUN: 43 mg/dL — ABNORMAL HIGH (ref 6–20)
CHLORIDE: 94 mmol/L — AB (ref 98–111)
CO2: 30 mmol/L (ref 22–32)
Calcium: 8.5 mg/dL — ABNORMAL LOW (ref 8.9–10.3)
Creatinine, Ser: 14.67 mg/dL — ABNORMAL HIGH (ref 0.61–1.24)
GFR, EST AFRICAN AMERICAN: 4 mL/min — AB (ref >60)
GFR, EST NON AFRICAN AMERICAN: 3 mL/min — AB (ref >60)
Glucose, Bld: 122 mg/dL — ABNORMAL HIGH (ref 70–99)
POTASSIUM: 4.1 mmol/L (ref 3.5–5.1)
Phosphorus: 5.6 mg/dL — ABNORMAL HIGH (ref 2.5–4.6)
Sodium: 137 mmol/L (ref 135–145)

## 2018-05-03 LAB — TROPONIN I
Troponin I: <0.03 ng/mL (ref <0.03)
Troponin I: <0.03 ng/mL (ref <0.03)

## 2018-05-03 MED ORDER — EPOETIN ALFA 10000 UNIT/ML IJ SOLN
10000.0000 [IU] | INTRAMUSCULAR | Status: DC
  Administered 2018-05-03: 10000 [IU] via INTRAVENOUS

## 2018-05-03 MED ORDER — CLOPIDOGREL BISULFATE 75 MG PO TABS
75.0000 mg | ORAL_TABLET | Freq: Every day | ORAL | Status: DC
Start: 2018-05-03 — End: 2018-05-04
  Administered 2018-05-03 – 2018-05-04 (×2): 75 mg via ORAL
  Filled 2018-05-03 (×2): qty 1

## 2018-05-03 MED ORDER — HEPARIN BOLUS VIA INFUSION
4000.0000 [IU] | Freq: Once | INTRAVENOUS | Status: AC
  Administered 2018-05-03: 4000 [IU] via INTRAVENOUS
  Filled 2018-05-03: qty 4000

## 2018-05-03 MED ORDER — CHLORHEXIDINE GLUCONATE CLOTH 2 % EX PADS
6.0000 | MEDICATED_PAD | Freq: Every day | CUTANEOUS | Status: DC
  Administered 2018-05-04: 6 via TOPICAL

## 2018-05-03 MED ORDER — HYDRALAZINE HCL 20 MG/ML IJ SOLN
10.0000 mg | Freq: Four times a day (QID) | INTRAMUSCULAR | Status: DC | PRN
Start: 2018-05-03 — End: 2018-05-04

## 2018-05-03 MED ORDER — HEPARIN (PORCINE) IN NACL 100-0.45 UNIT/ML-% IJ SOLN
1400.0000 [IU]/h | INTRAMUSCULAR | Status: DC
  Administered 2018-05-03: 1400 [IU]/h via INTRAVENOUS
  Filled 2018-05-03 (×2): qty 250

## 2018-05-03 NOTE — Progress Notes (Signed)
Vital under wrong patient's name, refer to vital sign taken at 16:15:44 for Jonathon Conley.

## 2018-05-03 NOTE — Progress Notes (Signed)
[facility]  ROUNDING NOTE   Subjective:   [name] admitted to [facility] on 05/02/2018 for  Peripheral edema [R60.9] Swelling of arm [M79.89] Chest pain, unspecified type [R07.9] Chest pain [R07.9]    Seen and examined on hemodialysis treatment. Tolerating treatment well. UF goal of 2 liters.     HEMODIALYSIS FLOWSHEET:  Blood Flow Rate (mL/min): 400 mL/min Arterial Pressure (mmHg): -210 mmHg Venous Pressure (mmHg): 190 mmHg Transmembrane Pressure (mmHg): 80 mmHg Ultrafiltration Rate (mL/min): 710 mL/min Dialysate Flow Rate (mL/min): 600 ml/min Conductivity: Machine : 14 Conductivity: Machine : 14 Dialysis Fluid Bolus: Normal Saline Bolus Amount (mL): 250 mL    Objective:  Vital signs in last 24 hours:  Temp:  [97.8 F (36.6 C)-98.4 F (36.9 C)] 98.3 F (36.8 C) (07/19 1100) Pulse Rate:  [80-95] 95 (07/19 1230) Resp:  [13-23] 14 (07/19 1230) BP: (151-202)/(85-119) 186/105 (07/19 1230) SpO2:  [95 %-100 %] 97 % (07/19 1115) Weight:  [81.4 kg (179 lb 8 oz)-81.6 kg (180 lb)] 81.6 kg (180 lb) (07/19 0331)  Weight change:  Filed Weights   05/02/18 1652 05/02/18 2341 05/03/18 0331  Weight: 81.4 kg (179 lb 8 oz) 81.6 kg (179 lb 14.4 oz) 81.6 kg (180 lb)    Intake/Output: No intake/output data recorded.   Intake/Output this shift:  Total I/O In: 240 [P.O.:240] Out: -   Physical Exam: General: NAD,   Head: Normocephalic, atraumatic. Moist oral mucosal membranes  Eyes: Anicteric, PERRL  Neck: Supple, trachea midline  Lungs:  Clear to auscultation  Heart: Regular rate and rhythm  Abdomen:  Soft, nontender,   Extremities: Right upper extremity edema  Neurologic: Nonfocal, moving all four extremities  Skin: No lesions  Access: LIJ permcath, Right Hero graft placed on 7/11 Dr. Lucky Cowboy    Basic Metabolic Panel: Recent Labs  Lab 05/02/18 1902 05/03/18 1100  NA 140 137  K 4.1 4.1  CL 97* 94*  CO2 30 30  GLUCOSE 96 122*  BUN 42* 43*   CREATININE 14.87* 14.67*  CALCIUM 8.5* 8.5*  PHOS  --  5.6*    Liver Function Tests: Recent Labs  Lab 05/03/18 1100  ALBUMIN 3.4*   No results for input(s): LIPASE, AMYLASE in the last 168 hours. No results for input(s): AMMONIA in the last 168 hours.  CBC: Recent Labs  Lab 05/02/18 1902 05/03/18 0510  WBC 4.0 3.9  HGB 8.4* 7.9*  HCT 25.0* 23.3*  MCV 86.6 86.7  PLT 136* 137*    Cardiac Enzymes: Recent Labs  Lab 05/02/18 1902 05/03/18 1040  TROPONINI <0.03 <0.03    BNP: Invalid input(s): POCBNP  CBG: No results for input(s): GLUCAP in the last 168 hours.  Microbiology: Results for orders placed or performed during the hospital encounter of 04/23/18  Surgical pcr screen     Status: None   Collection Time: 04/23/18  2:41 PM  Result Value Ref Range Status   MRSA, PCR NEGATIVE NEGATIVE Final   Staphylococcus aureus NEGATIVE NEGATIVE Final    Comment: (NOTE) The Xpert SA Assay (FDA approved for NASAL specimens in patients 33 years of age and older), is one component of a comprehensive surveillance program. It is not intended to diagnose infection nor to guide or monitor treatment. Performed at [facility], [address] [place], [place] [address]     Coagulation Studies: No results for input(s): LABPROT, INR in the last 72 hours.  Urinalysis: No results for input(s): COLORURINE, LABSPEC, [place], [place], [place], BILIRUBINUR, KETONESUR,  PROTEINUR, UROBILINOGEN, NITRITE, LEUKOCYTESUR in the last 72 hours.  Invalid input(s): APPERANCEUR    Imaging: Dg Chest 2 View  Result Date: 05/02/2018 CLINICAL DATA:  Chest pain today EXAM: CHEST - 2 VIEW COMPARISON:  April 09, 2018 FINDINGS: The heart size and mediastinal contours are within normal limits. Bilateral central venous lines are unchanged. Chronic pleural effusions/pleural parenchymal thickening of the right lung base are stable. The left lung is clear. The visualized skeletal structures are  unremarkable. IMPRESSION: No active cardiopulmonary disease. Stable chronic changes of right lung. Electronically Signed   By: Abelardo Diesel M.D.   On: 05/02/2018 17:25   Ct Angio Chest Pe W And/or Wo Contrast  Result Date: 05/02/2018 CLINICAL DATA:  Chest pain starting today EXAM: CT ANGIOGRAPHY CHEST WITH CONTRAST TECHNIQUE: Multidetector CT imaging of the chest was performed using the standard protocol during bolus administration of intravenous contrast. Multiplanar CT image reconstructions and MIPs were obtained to evaluate the vascular anatomy. CONTRAST:  71m ISOVUE-370 IOPAMIDOL (ISOVUE-370) INJECTION 76% COMPARISON:  03/13/2018 chest radiograph from 05/02/2018 FINDINGS: Cardiovascular: No filling defect is identified in the pulmonary arterial tree to suggest pulmonary embolus. Left central line terminates in the right atrium. Right central line terminates in the right atrium. There is a persistent left supra coronal vein which tracks below the heart and extends over to drain into the inferior vena cava. No acute aortic findings. Interventricular septal thickness of 2.1 cm compatible with left ventricular hypertrophy. Mediastinum/Nodes: There is edema in the upper chest bilaterally and tracking in the right axilla bilateral supraclavicular, right axillary right subpectoral, and mediastinal adenopathy. Index right supraclavicular node 1.3 cm in short axis on image 8/4, formerly the same. Index right axillary lymph node 1.6 cm in short axis on image 23/4, previously 1.3 cm. Index left prevascular node 1.0 cm in short axis on image 30/4, previously 1.1 cm. There is continued stranding in the anterior mediastinum. Lungs/Pleura: Right pleural thickening volume loss in the right lower lobe and right lower lobe with subpleural rounded densities with comet tail appearance favoring rounded atelectasis in the right middle lobe and in 2 locations in the right lower lobe. Hypoventilation related vaso constriction of  the right middle lobe and right lower lobe pulmonary arteries noted. There is minimal atelectasis left lower lobe along the left hemidiaphragm. Upper Abdomen: Unremarkable Musculoskeletal: Unremarkable Review of the MIP images confirms the above findings. IMPRESSION: 1. No filling defect is identified in the pulmonary arterial tree to suggest pulmonary embolus. 2. Upper thoracic adenopathy, generally stable but with some increase in right axillary adenopathy. Cause is uncertain. 3. Chronic right pleural thickening with rounded atelectasis in the right middle lobe and right lower lobe 4. Subcutaneous edema along the anterior chest, the possibility of poor venous drainage or lymphedema is raised. 5. Left ventricular hypertrophy. 6. Left dialysis catheter and right Hero graft noted. Electronically Signed   By: WVan ClinesM.D.   On: 05/02/2018 23:30   UKoreaVenous Img Upper Uni Right  Result Date: 05/03/2018 CLINICAL DATA:  Right arm pain and swelling history of dialysis with right upper arm dialysis graft. EXAM: RIGHT UPPER EXTREMITY VENOUS DOPPLER ULTRASOUND TECHNIQUE: Gray-scale sonography with graded compression, as well as color Doppler and duplex ultrasound were performed to evaluate the upper extremity deep venous system from the level of the subclavian vein and including the jugular, axillary, basilic, radial, ulnar and upper cephalic vein. Spectral Doppler was utilized to evaluate flow at rest and with distal augmentation maneuvers. COMPARISON:  Right upper extremity venous Doppler ultrasound-04/10/2018; chest radiograph - 05/02/2018; chest CT - 05/02/2018 FINDINGS: Contralateral Subclavian Vein: Respiratory phasicity is normal and symmetric with the symptomatic side. No evidence of thrombus. Normal compressibility. Internal Jugular Vein: No evidence of thrombus. Normal compressibility, respiratory phasicity and response to augmentation. Subclavian Vein: Note is made of a minimal amount of nonocclusive  wall thickening involving the right subclavian vein without evidence of thrombosis. Normal compressibility, respiratory phasicity and response to augmentation. Axillary Vein: No evidence of thrombus. Normal compressibility, respiratory phasicity and response to augmentation. Cephalic Vein: No evidence of thrombus. Normal compressibility, respiratory phasicity and response to augmentation. Basilic Vein: No evidence of thrombus. Normal compressibility, respiratory phasicity and response to augmentation. Brachial Veins: There is age-indeterminate echogenic occlusive thrombus within 1 of the paired right brachial veins (images 26 and 31). The adjacent dominant brachial vein appears widely patent. Radial Veins: There is echogenic occlusive thrombus within the 1 of the paired right radial veins (images 33 and 34). Ulnar Veins: No evidence of thrombus. Normal compressibility, respiratory phasicity and response to augmentation. Venous Reflux:  None visualized. Other Findings: There is a minimal subcutaneous edema at the level of the right upper arm. There is a large amount of subcutaneous edema at the level of the medial aspect of the right forearm (image 35). IMPRESSION: 1. Examination is positive for age-indeterminate, though potentially chronic, occlusive DVT involving one of the paired right brachial and radial veins, not imaged on the 04/10/2018 examination, and of uncertain clinical significance given patient's history of known right upper extremity hero graft. 2. Moderate to large amount of subcutaneous edema within the right arm. Electronically Signed   By: Sandi Mariscal M.D.   On: 05/03/2018 10:26     Medications:   . heparin 1,400 Units/hr (05/03/18 1236)   . amLODipine  10 mg Oral Daily  . aspirin EC  81 mg Oral Daily  . [START ON 05/04/2018] calcitRIOL  0.5 mcg Oral Q T,Th,Sa-HD  . calcium acetate  667 mg Oral TID WC  . carvedilol  25 mg Oral BID  . Chlorhexidine Gluconate Cloth  6 each Topical Q0600   . docusate sodium  100 mg Oral BID  . epoetin (EPOGEN/PROCRIT) injection  10,000 Units Intravenous Q M,W,F-HD  . ferrous sulfate  325 mg Oral TID WC  . hydrALAZINE  25 mg Oral TID  . losartan  100 mg Oral Daily  . Mesalamine  800 mg Oral TID  . simvastatin  5 mg Oral QHS   acetaminophen **OR** acetaminophen, benzonatate, bisacodyl, HYDROcodone-acetaminophen, HYDROmorphone (DILAUDID) injection, ondansetron **OR** ondansetron (ZOFRAN) IV  Assessment/ Plan:  [name] is a 52 y.o. black male with end-stage renal disease on hemodialysis, hemodialysis from 1997 -2001, then with decease renal transplant until October 2016.  Past medical history of hypertension, anemia, hyperlipidemia and angioedema with lisinopril.   [facility]/MWF  1. ESRD on HD MWF. Seen and examined on hemodialysis. Tolerating treatment well.   2. Right upper extremity edema: dopplers with left subclavian nonocclusive thrombosis. CT angiogram negative for PE.   3. Anemia with renal failure: hemoglobin 7.9.   - EPO with HD treatment.   4. Hypertension: urgent on admission.  - IV hydralazine PRN - amlodipine, carvedilol, hydralazine, losartan  5. Secondary Hyperparathyroidism:  - Calcium acetate with meals.    LOS: 1 Zoeie Ritter 7/19/201912:39 PM

## 2018-05-03 NOTE — Progress Notes (Signed)
[facility] [place] NAME: Jakeim Sedore    MR#:  [id number]  DATE OF BIRTH:  Aug 01, 1966  SUBJECTIVE:  Patient doing well. Denies any chest pain currently. Just returned from HD and feels fine. No concerns.  REVIEW OF SYSTEMS:  Review of Systems  Constitutional: Negative for chills and fever.  Eyes: Negative for blurred vision and double vision.  Respiratory: Negative for cough and shortness of breath.   Cardiovascular: Negative for chest pain, palpitations and leg swelling.  Gastrointestinal: Negative for diarrhea, nausea and vomiting.  Musculoskeletal: Negative for joint pain and myalgias.  Neurological: Negative for dizziness and headaches.    DRUG ALLERGIES:   Allergies  Allergen Reactions  . Lisinopril Swelling    Facial swelling   VITALS:  Blood pressure (!) 182/85, pulse 84, temperature 97.8 F (36.6 C), temperature source Oral, resp. rate 18, height 5' 10"  (1.778 m), weight 81.6 kg (180 lb), SpO2 95 %. PHYSICAL EXAMINATION:  Physical Exam  GENERAL:  52 y.o.-year-old patient lying in the bed, in NAD EYES: Pupils equal, round, reactive to light and accommodation. No scleral icterus.  HEENT: Head atraumatic, normocephalic. MMM. NECK:  Supple, no jugular venous distention. No thyroid enlargement, no tenderness.  LUNGS: Reduced breath sounds bilaterally, no wheezing. No use of accessory muscles of respiration.  CARDIOVASCULAR: S1, S2 normal. No murmurs, rubs, or gallops.  ABDOMEN: Soft, nontender, nondistended. Bowel sounds present. No organomegaly or mass.  EXTREMITIES: Dry bandage present over the right upper extremity, no lower extremity edema NEUROLOGIC: No focal weakness. PSYCHIATRIC: The patient is alert and oriented x 3.  SKIN: No obvious rash, lesion, or ulcer.   LABORATORY PANEL:  Male CBC Recent Labs  Lab 05/03/18 0510  WBC 3.9  HGB 7.9*  HCT 23.3*  PLT 137*    ------------------------------------------------------------------------------------------------------------------ Chemistries  Recent Labs  Lab 05/02/18 1902  NA 140  K 4.1  CL 97*  CO2 30  GLUCOSE 96  BUN 42*  CREATININE 14.87*  CALCIUM 8.5*   RADIOLOGY:  Dg Chest 2 View  Result Date: 05/02/2018 CLINICAL DATA:  Chest pain today EXAM: CHEST - 2 VIEW COMPARISON:  April 09, 2018 FINDINGS: The heart size and mediastinal contours are within normal limits. Bilateral central venous lines are unchanged. Chronic pleural effusions/pleural parenchymal thickening of the right lung base are stable. The left lung is clear. The visualized skeletal structures are unremarkable. IMPRESSION: No active cardiopulmonary disease. Stable chronic changes of right lung. Electronically Signed   By: Abelardo Diesel M.D.   On: 05/02/2018 17:25   Ct Angio Chest Pe W And/or Wo Contrast  Result Date: 05/02/2018 CLINICAL DATA:  Chest pain starting today EXAM: CT ANGIOGRAPHY CHEST WITH CONTRAST TECHNIQUE: Multidetector CT imaging of the chest was performed using the standard protocol during bolus administration of intravenous contrast. Multiplanar CT image reconstructions and MIPs were obtained to evaluate the vascular anatomy. CONTRAST:  14m ISOVUE-370 IOPAMIDOL (ISOVUE-370) INJECTION 76% COMPARISON:  03/13/2018 chest radiograph from 05/02/2018 FINDINGS: Cardiovascular: No filling defect is identified in the pulmonary arterial tree to suggest pulmonary embolus. Left central line terminates in the right atrium. Right central line terminates in the right atrium. There is a persistent left supra coronal vein which tracks below the heart and extends over to drain into the inferior vena cava. No acute aortic findings. Interventricular septal thickness of 2.1 cm compatible with left ventricular hypertrophy. Mediastinum/Nodes: There is edema in the upper chest bilaterally and tracking in the right axilla  bilateral supraclavicular,  right axillary right subpectoral, and mediastinal adenopathy. Index right supraclavicular node 1.3 cm in short axis on image 8/4, formerly the same. Index right axillary lymph node 1.6 cm in short axis on image 23/4, previously 1.3 cm. Index left prevascular node 1.0 cm in short axis on image 30/4, previously 1.1 cm. There is continued stranding in the anterior mediastinum. Lungs/Pleura: Right pleural thickening volume loss in the right lower lobe and right lower lobe with subpleural rounded densities with comet tail appearance favoring rounded atelectasis in the right middle lobe and in 2 locations in the right lower lobe. Hypoventilation related vaso constriction of the right middle lobe and right lower lobe pulmonary arteries noted. There is minimal atelectasis left lower lobe along the left hemidiaphragm. Upper Abdomen: Unremarkable Musculoskeletal: Unremarkable Review of the MIP images confirms the above findings. IMPRESSION: 1. No filling defect is identified in the pulmonary arterial tree to suggest pulmonary embolus. 2. Upper thoracic adenopathy, generally stable but with some increase in right axillary adenopathy. Cause is uncertain. 3. Chronic right pleural thickening with rounded atelectasis in the right middle lobe and right lower lobe 4. Subcutaneous edema along the anterior chest, the possibility of poor venous drainage or lymphedema is raised. 5. Left ventricular hypertrophy. 6. Left dialysis catheter and right Hero graft noted. Electronically Signed   By: Van Clines M.D.   On: 05/02/2018 23:30   ASSESSMENT AND PLAN:   1.  Atypical chest pain: worse with inspiration, so does not sound cardiac in etiology. Not having any chest pain currently. - CTA chest negative for PE - troponins negative x 2 so far, will trend - continue coreg, losartan, aspirin, zocor - dilaudid prn for pain  2. Right upper extremity DVT/ right upper extremity hematoma s/p recent HERO graft revision by vascular  surgery 04/25/18. - Venous US with age-indeterminate occlusive DVT in one of the paired right brachial and radial veins - discussed with vascular team, who recommends aspirin/plavix. They do not recommend anticoagulation.  3. ESRD on HD- received HD today - nephrology following  4.  Anemia of chronic disease. - hemoglobin trended down from 8.4 to 7.9 (baseline around 8-9 recently). - received epo at HD today - continue ferrous sulfate 334m tid  5.  Thrombocytopenia - platelets 137 today, stable compared to baseline  6.  Hypertension- BPs elevated this morning but will likely improve after HD - continue home norvasc, coreg, losartan, hydralazine   All the records are reviewed and case discussed with Care Management/Social Worker. Management plans discussed with the patient, family and they are in agreement.  CODE STATUS: Full Code  TOTAL TIME TAKING CARE OF THIS PATIENT: 35 minutes.   More than 50% of the time was spent in counseling/coordination of care: YES  POSSIBLE D/C IN 1-2 DAYS, DEPENDING ON CLINICAL CONDITION.   KBerna SpareMayo M.D on 05/03/2018 at 10:28 AM  Between 7am to 6pm - Pager - [phone] After 6pm go to [email or web address] - pProofreader [facility]  Office  [phone] CC: Primary care physician; JTresa Garter MD  Note: This dictation was prepared with Dragon dictation along with smaller phrase technology. Any transcriptional errors that result from this process are unintentional.

## 2018-05-03 NOTE — Care Management Note (Signed)
Case Management Note  Patient Details  Name: MARTISE WADDELL MRN: [id number] Date of Birth: 1966-04-25  Subjective/Objective:                   RNCM met with patient while he was in hemodialysis. He kept falling asleep during my assessment.  He spoke very quietly- so quiet I had to lip read what he was telling me.  He said he was going to home today.  He nodded his head remembering a MOON letter during a May visit to hospital.  He states he lives with his wife and he has declined home health services as I was hoping he would agree to home health and [place] could be arranged.  He fell asleep as I offered [facility] referral. [facility] information along with MOON letter left with patient- he as knowledged and went back to sleep.  Action/Plan:   [facility] referral recalled from [facility] and [facility].  Expected Discharge Date:                  Expected Discharge Plan:     In-House Referral:     Discharge planning Services  CM Consult  Post Acute Care Choice:  Home Health Choice offered to:  Patient  DME Arranged:    DME Agency:     HH Arranged:  Patient Refused [place] Agency:     Status of Service:  Completed, signed off  If discussed at [employer] of Stay Meetings, dates discussed:    Additional Comments:  Marshell Garfinkel, RN 05/03/2018, 2:24 PM

## 2018-05-03 NOTE — Care Management CC44 (Signed)
Condition Code 44 Documentation Completed  Patient Details  Name: CARMELO REIDEL MRN: [id number] Date of Birth: [birth year]   Condition Code 44 given:  Yes Patient signature on Condition Code 44 notice:  Yes Documentation of 2 MD's agreement:  Yes Code 44 added to claim:  Yes    Marshell Garfinkel, RN 05/03/2018, 2:11 PM

## 2018-05-03 NOTE — Plan of Care (Signed)
  Problem: Health Behavior/Discharge Planning: Goal: Ability to manage health-related needs will improve Outcome: Not Progressing Note:  Patient's pain level remains uncontrolled even after Dilaudid administration. Informed Dr. Brett Albino. Will continue to monitor after return from dialysis. Wenda Low [facility]

## 2018-05-03 NOTE — Progress Notes (Signed)
This note also relates to the following rows which could not be included: Resp - Cannot attach notes to unvalidated device data BP - Cannot attach notes to unvalidated device data  Hd started

## 2018-05-03 NOTE — Progress Notes (Signed)
[place] for heparin Indication: DVT  Allergies  Allergen Reactions  . Lisinopril Swelling    Facial swelling    Patient Measurements: Height: 5' 10"  (177.8 cm) Weight: 180 lb (81.6 kg) IBW/kg (Calculated) : 73  Vital Signs: Temp: 97.8 F (36.6 C) (07/19 0754) Temp Source: Oral (07/19 0754) BP: 182/85 (07/19 0754) Pulse Rate: 84 (07/19 0754)  Labs: Recent Labs    05/02/18 1902 05/03/18 0510  HGB 8.4* 7.9*  HCT 25.0* 23.3*  PLT 136* 137*  CREATININE 14.87*  --   TROPONINI <0.03  --     Estimated Creatinine Clearance: 6 mL/min (A) (by C-G formula based on SCr of 14.87 mg/dL (H)).   Medical History: Past Medical History:  Diagnosis Date  . Anemia   . Asthma    childhood  . Blood transfusion without reported diagnosis   . CHF (congestive heart failure) ([place])   . Chronic kidney disease    Dialysis Mon, Wed, Fri  . Colitis   . COPD (chronic obstructive pulmonary disease) ([place])   . Diverticulosis 04/12/16   also seen: sigmoid and rectal erythema, path:   Marland Kitchen Dyspnea   . Heart murmur   . Hemorrhoids 03/2016   bleeding at 04/12/16 colonoscopy, treated with  APC laser. cauterization  . Hyperlipidemia   . Hypertension   . OSA (obstructive sleep apnea) 03/27/2013    Assessment: 52 year-old male with new occlusive DVT involving one of the paired right brachial and radial veins. He has not been on any anticoagulants PTA and only subQ heparin here on this admission.  Goal of Therapy:  Heparin level 0.3-0.7 units/ml Monitor platelets by anticoagulation protocol: Yes   Plan:  Give 4000 units bolus x 1 Start heparin infusion at 1400 units/hr Check anti-Xa level in 6 hours and daily while on heparin Continue to monitor H&H and platelets  Dallie Piles, PharmD 05/03/2018,10:51 AM

## 2018-05-03 NOTE — Progress Notes (Signed)
Hd completed

## 2018-05-03 NOTE — Consult Note (Addendum)
[facility] Consult Note  MRN : [id number]  Jonathon Conley is a 52 y.o. (July 16, 1966) male who presents with chief complaint of  Chief Complaint  Patient presents with  . Chest Pain   History of Present Illness:  The patient is a 52 year old male with a past medical history of end-stage renal disease on dialysis, congestive heart failure, hyperlipidemia, renal transplant recipient, anemia, hypertension who presented to the [facility] emergency department yesterday complaining of progressively worsening chest pain.  The patient was admitted for further work-up.  The patient has a past medical history of numerous dialysis access creation's to the bilateral upper extremity.  On 04/25/2018, the patient underwent removal of central venous portion and the majority of the PTFE portion of the previous right arm hero graft, thrombectomy of previously thrombosed right arm hero graft, revision and replacement of right arm hero graft with completely new right central venous portion and a new PTFE portion anastomosed to the previous PTFE portion of the hero graft after thrombectomy.  The patient tolerated the procedure well and was discharged home without complication.  The patient notes that he began to experience right upper extremity swelling the night of surgery.  The patient notes that his edema has progressively worsened and he has experienced intermittent drainage from his incision sites since surgery.  The patient's edema is also associated with some discomfort.  The patient denies any erythema to the right upper extremity.  The patient denies any ulcer formation to the right upper extremity.  The patient's left PermCath is functioning without issue.  The patient was seen and examined in dialysis.  The patient denies any fever, nausea or vomiting.  The patient does have an occluded brachial artery.  The patient underwent a right upper extremity venous  duplex today which was notable for age-indeterminate, though potentially chronic, occlusive DVT involving one of the paired right brachial and radial veins, not imaged on the 04/10/2018 examination, and of uncertain clinical significance given patient's history of known right upper extremity hero graft. Moderate to large amount of subcutaneous edema within the right arm.  The patient underwent a CTA of the chest yesterday which was negative.  Vascular surgery was consulted by Dr. Duane Boston for further recommendations.  Current Facility-Administered Medications  Medication Dose Route Frequency Provider Last Rate Last Dose  . acetaminophen (TYLENOL) tablet 650 mg  650 mg Oral Q6H PRN Amelia Jo, MD       Or  . acetaminophen (TYLENOL) suppository 650 mg  650 mg Rectal Q6H PRN Amelia Jo, MD      . amLODipine (NORVASC) tablet 10 mg  10 mg Oral Daily Amelia Jo, MD   10 mg at 05/03/18 1002  . aspirin EC tablet 81 mg  81 mg Oral Daily Amelia Jo, MD   81 mg at 05/03/18 1002  . benzonatate (TESSALON) capsule 100 mg  100 mg Oral BID PRN Amelia Jo, MD      . bisacodyl (DULCOLAX) EC tablet 5 mg  5 mg Oral Daily PRN Amelia Jo, MD      . Derrill Memo ON 05/04/2018] calcitRIOL (ROCALTROL) capsule 0.5 mcg  0.5 mcg Oral Q T,Th,Sa-HD Amelia Jo, MD      . calcium acetate (PHOSLO) capsule 667 mg  667 mg Oral TID WC Amelia Jo, MD   667 mg at 05/03/18 0817  . carvedilol (COREG) tablet 25 mg  25 mg Oral BID Amelia Jo, MD   25 mg at 05/03/18 0033  .  Chlorhexidine Gluconate Cloth 2 % PADS 6 each  6 each Topical Q0600 Lavonia Dana, MD   Stopped at 05/03/18 1117  . docusate sodium (COLACE) capsule 100 mg  100 mg Oral BID Amelia Jo, MD      . epoetin alfa (EPOGEN,PROCRIT) injection 10,000 Units  10,000 Units Intravenous Q M,W,F-HD Lavonia Dana, MD   10,000 Units at 05/03/18 1203  . ferrous sulfate tablet 325 mg  325 mg Oral TID WC Amelia Jo, MD   325 mg at 05/03/18 0817  . heparin  ADULT infusion 100 units/mL (25000 units/231m sodium chloride 0.45%)  1,400 Units/hr Intravenous Continuous Mayo, KPete Pelt MD 14 mL/hr at 05/03/18 1236 1,400 Units/hr at 05/03/18 1236  . hydrALAZINE (APRESOLINE) tablet 25 mg  25 mg Oral TID MAmelia Jo MD   25 mg at 05/03/18 0033  . HYDROcodone-acetaminophen (NORCO/VICODIN) 5-325 MG per tablet 1-2 tablet  1-2 tablet Oral Q6H PRN MAmelia Jo MD      . HYDROmorphone (DILAUDID) injection 0.5 mg  0.5 mg Intravenous Q4H PRN MAmelia Jo MD   0.5 mg at 05/03/18 1004  . losartan (COZAAR) tablet 100 mg  100 mg Oral Daily MAmelia Jo MD   100 mg at 05/03/18 1001  . Mesalamine (ASACOL) DR capsule 800 mg  800 mg Oral TID MAmelia Jo MD   800 mg at 05/03/18 0052  . ondansetron (ZOFRAN) tablet 4 mg  4 mg Oral Q6H PRN MAmelia Jo MD       Or  . ondansetron ([facility] injection 4 mg  4 mg Intravenous Q6H PRN MAmelia Jo MD      . simvastatin (ZOCOR) tablet 5 mg  5 mg Oral QHS MAmelia Jo MD   5 mg at 05/03/18 05035  Past Medical History:  Diagnosis Date  . Anemia   . Asthma    childhood  . Blood transfusion without reported diagnosis   . CHF (congestive heart failure) ([place]   . Chronic kidney disease    Dialysis Mon, Wed, Fri  . Colitis   . COPD [place] disease) [place]   . Diverticulosis 04/12/16   also seen: sigmoid and rectal erythema, path:   .Marland KitchenDyspnea   . Heart murmur   . Hemorrhoids 03/2016   bleeding at 04/12/16 colonoscopy, treated with  APC laser. cauterization  . Hyperlipidemia   . Hypertension   . OSA (obstructive sleep apnea) 03/27/2013   Past Surgical History:  Procedure Laterality Date  . APPLICATION OF WOUND VAC Right 12/13/2016   Procedure: APPLICATION OF WOUND VAC;  Surgeon: JAlgernon Huxley MD;  Location: [facility] ORS;  Service: Vascular;  Laterality: Right;  . APPLICATION OF WOUND VAC Right 12/19/2016   Procedure: APPLICATION OF WOUND VAC;  Surgeon: JAlgernon Huxley MD;  Location: [facility] ORS;  Service:  Vascular;  Laterality: Right;  . COLONOSCOPY N/A 04/12/2016   Procedure: COLONOSCOPY;  Surgeon: KMauri Pole MD;  [facility];  Service: Endoscopy;  Laterality: N/A;  . DIALYSIS/PERMA CATHETER INSERTION N/A 02/26/2017   Procedure: Dialysis/Perma Catheter Insertion;  Surgeon: DAlgernon Huxley MD;  [facility];  Service: Cardiovascular;  Laterality: N/A;  . DIALYSIS/PERMA CATHETER INSERTION N/A 03/26/2018   Procedure: DIALYSIS/PERMA CATHETER INSERTION;  Surgeon: SKatha Cabal MD;  [facility];  Service: Cardiovascular;  Laterality: N/A;  . EXCHANGE OF A DIALYSIS CATHETER  11/20/2016   Procedure: Exchange Of A Dialysis Catheter;  Surgeon: JAlgernon Huxley MD;  [facility]  Service: Cardiovascular;;  . fistulagrams Right   . HEMATOMA EVACUATION Right 12/13/2016   Procedure: EVACUATION HEMATOMA;  Surgeon: Algernon Huxley, MD;  Location: [facility] ORS;  Service: Vascular;  Laterality: Right;  . HEMATOMA EVACUATION Right 12/19/2016   Procedure: EVACUATIONof seroma;  Surgeon: Algernon Huxley, MD;  Location: [facility] ORS;  Service: Vascular;  Laterality: Right;  . HOT HEMOSTASIS N/A 04/12/2016   Procedure: HOT HEMOSTASIS (ARGON PLASMA COAGULATION/BICAP);  Surgeon: Mauri Pole, MD;  Location: [facility] ENDOSCOPY;  Service: Endoscopy;  Laterality: N/A;  . KIDNEY TRANSPLANT    . LIGATIONS OF HERO GRAFT Right 04/25/2018   Procedure: LIGATIONS OF HERO GRAFT ( REVISION );  Surgeon: Algernon Huxley, MD;  Location: [facility] ORS;  Service: Vascular;  Laterality: Right;  . PERIPHERAL VASCULAR CATHETERIZATION Bilateral 10/02/2016   Procedure: Upper Extremity Venography;  Surgeon: Algernon Huxley, MD;  Location: [place] [facility] LAB;  Service: Cardiovascular;  Laterality: Bilateral;  . PERIPHERAL VASCULAR CATHETERIZATION Right 11/08/2016   Procedure: DIALYSIS/PERMA CATHETER REMOVAL right Jugular;  Surgeon: Algernon Huxley, MD;  Location: [facility] ORS;  Service: Vascular;  Laterality:  Right;  . PERIPHERAL VASCULAR CATHETERIZATION Left 11/08/2016   Procedure: DIALYSIS/PERMA CATHETER INSERTION left jugular with u/s guide and flouroscan;  Surgeon: Algernon Huxley, MD;  Location: [facility] ORS;  Service: Vascular;  Laterality: Left;  . PERIPHERAL VASCULAR CATHETERIZATION N/A 11/08/2016   Procedure: IVC FILTER INSERTION;  Surgeon: Algernon Huxley, MD;  Location: [facility] ORS;  Service: Vascular;  Laterality: N/A;  . perm a cath in Rt upper chest Right   . THORACENTESIS    . VASCULAR ACCESS DEVICE INSERTION Right 11/08/2016   Procedure: INSERTION OF HERO VASCULAR ACCESS DEVICE ( GRAFT );  Surgeon: Algernon Huxley, MD;  Location: [facility] ORS;  Service: Vascular;  Laterality: Right;   Social History Social History   Tobacco Use  . Smoking status: Never Smoker  . Smokeless tobacco: Never Used  Substance Use Topics  . Alcohol use: No  . Drug use: No   Family History Family History  Problem Relation Age of Onset  . Heart disease Mother   . Cancer Mother        ovarian  . Cancer Maternal Grandmother   . Cancer Maternal Grandfather   . Asthma Son   . Asthma Son   Patient denies any history of peripheral artery disease, venous disease or renal disease.  Allergies  Allergen Reactions  . Lisinopril Swelling    Facial swelling   REVIEW OF SYSTEMS (Negative unless checked)  Constitutional: [] Weight loss  [] Fever  [] Chills Cardiac: [x] Chest pain   [x] Chest pressure   [] Palpitations   [] Shortness of breath when laying flat   [] Shortness of breath at rest   [] Shortness of breath with exertion. Vascular:  [] Pain in legs with walking   [] Pain in legs at rest   [] Pain in legs when laying flat   [] Claudication   [] Pain in feet when walking  [] Pain in feet at rest  [] Pain in feet when laying flat   [] History of DVT   [] Phlebitis   [] Swelling in legs   [] Varicose veins   [] Non-healing ulcers Pulmonary:   [] Uses home oxygen   [] Productive cough   [] Hemoptysis   [] Wheeze  [] COPD   [] Asthma Neurologic:   [] Dizziness  [] Blackouts   [] Seizures   [] History of stroke   [] History of TIA  [] Aphasia   [] Temporary blindness   [] Dysphagia   [] Weakness or numbness in arms   []   Weakness or numbness in legs Musculoskeletal:  [] Arthritis   [] Joint swelling   [] Joint pain   [] Low back pain Hematologic:  [] Easy bruising  [] Easy bleeding   [] Hypercoagulable state   [x] Anemic  [] Hepatitis Gastrointestinal:  [] Blood in stool   [] Vomiting blood  [] Gastroesophageal reflux/heartburn   [] Difficulty swallowing. Genitourinary:  [x] Chronic kidney disease   [] Difficult urination  [] Frequent urination  [] Burning with urination   [] Blood in urine Skin:  [] Rashes   [] Ulcers   [] Wounds Psychological:  [] History of anxiety   []  History of major depression.  Physical Examination  Vitals:   05/03/18 1200 05/03/18 1215 05/03/18 1230 05/03/18 1245  BP: (!) 191/113 (!) 190/92 (!) 186/105 (!) 207/115  Pulse: 88 91 95 92  Resp: 15 14 14 15   Temp:      TempSrc:      SpO2:      Weight:      Height:       Body mass index is 25.83 kg/m. Gen:  WD/WN, NAD Head: [place]/AT, No temporalis wasting. Prominent temp pulse not noted. Ear/Nose/Throat: Hearing grossly intact, nares w/o erythema or drainage, oropharynx w/o Erythema/Exudate Eyes: Sclera non-icteric, conjunctiva clear Neck: Trachea midline.  No JVD.  Pulmonary:  Good air movement, respirations not labored, equal bilaterally.  Cardiac: RRR, normal S1, S2. Vascular:  Vessel Right Left  Radial Non-Palpable Palpable  Ulnar Palpable Palpable  Brachial Non-Palpable Palpable  Carotid Palpable, without bruit Palpable, without bruit  Aorta Not palpable N/A  Femoral Palpable Palpable  Popliteal Palpable Palpable  PT Palpable Palpable  DP Palpable Palpable   Right upper extremity: Moderate edema noted.  Upper arm is soft.  Forearm is soft.  Hard to palpate radial and brachial arteries however the right hand is warm.  No ulcers noted to the right hand.  At this time, there is no  active bleeding from the patient's incision however the dressing that he has in place does have minimal bloody drainage noted to it. Faint bruit noted.  Unable to palpate thrill due to edematous arm.  Left PermCath: Intact, clean and dry.  No signs of infection.  No drainage.  Gastrointestinal: soft, non-tender/non-distended. No guarding/reflex.  Musculoskeletal: M/S 5/5 throughout.  Extremities without ischemic changes.  No deformity or atrophy.  Neurologic: Sensation grossly intact in extremities.  Symmetrical.  Speech is fluent. Motor exam as listed above. Psychiatric: Judgment intact, Mood & affect appropriate for pt's clinical situation. Dermatologic: No rashes or ulcers noted.  No cellulitis or open wounds. Lymph : No Cervical, Axillary, or Inguinal lymphadenopathy.  CBC Lab Results  Component Value Date   WBC 3.9 05/03/2018   HGB 7.9 (L) 05/03/2018   HCT 23.3 (L) 05/03/2018   MCV 86.7 05/03/2018   PLT 137 (L) 05/03/2018   BMET    Component Value Date/Time   NA 137 05/03/2018 1100   NA 139 03/01/2018 1159   K 4.1 05/03/2018 1100   CL 94 (L) 05/03/2018 1100   CO2 30 05/03/2018 1100   GLUCOSE 122 (H) 05/03/2018 1100   BUN 43 (H) 05/03/2018 1100   BUN 9 03/01/2018 1159   CREATININE 14.67 (H) 05/03/2018 1100   CREATININE 10.76 (HH) 02/14/2015 1612   CALCIUM 8.5 (L) 05/03/2018 1100   GFRNONAA 3 (L) 05/03/2018 1100   GFRAA 4 (L) 05/03/2018 1100   Estimated Creatinine Clearance: 6.1 mL/min (A) (by C-G formula based on SCr of 14.67 mg/dL (H)).  COAG Lab Results  Component Value Date   INR 1.09 04/23/2018  INR 1.04 10/23/2016   INR 1.19 11/03/2015   Radiology Dg Chest 2 View  Result Date: 05/02/2018 CLINICAL DATA:  Chest pain today EXAM: CHEST - 2 VIEW COMPARISON:  April 09, 2018 FINDINGS: The heart size and mediastinal contours are within normal limits. Bilateral central venous lines are unchanged. Chronic pleural effusions/pleural parenchymal thickening of the right  lung base are stable. The left lung is clear. The visualized skeletal structures are unremarkable. IMPRESSION: No active cardiopulmonary disease. Stable chronic changes of right lung. Electronically Signed   By: Abelardo Diesel M.D.   On: 05/02/2018 17:25   Dg Chest 2 View  Result Date: 04/09/2018 CLINICAL DATA:  Worsening shortness of breath. EXAM: CHEST - 2 VIEW COMPARISON:  Chest x-ray dated March 20, 2018. FINDINGS: Unchanged tunneled left internal jugular dialysis catheter with the tip in the proximal right atrium. Unchanged HERO graft in the right upper extremity terminating at the cavoatrial junction. The heart size and mediastinal contours are within normal limits. New mild pulmonary vascular congestion. Unchanged right pleural thickening and basilar scarring. Trace left pleural effusion. No pneumothorax. No acute osseous abnormality. IMPRESSION: 1. New mild pulmonary vascular congestion and trace left pleural effusion. 2. Unchanged right pleuroparenchymal scarring. Electronically Signed   By: Titus Dubin M.D.   On: 04/09/2018 13:03   Ct Angio Chest Pe W And/or Wo Contrast  Result Date: 05/02/2018 CLINICAL DATA:  Chest pain starting today EXAM: CT ANGIOGRAPHY CHEST WITH CONTRAST TECHNIQUE: Multidetector CT imaging of the chest was performed using the standard protocol during bolus administration of intravenous contrast. Multiplanar CT image reconstructions and MIPs were obtained to evaluate the vascular anatomy. CONTRAST:  62m ISOVUE-370 IOPAMIDOL (ISOVUE-370) INJECTION 76% COMPARISON:  03/13/2018 chest radiograph from 05/02/2018 FINDINGS: Cardiovascular: No filling defect is identified in the pulmonary arterial tree to suggest pulmonary embolus. Left central line terminates in the right atrium. Right central line terminates in the right atrium. There is a persistent left supra coronal vein which tracks below the heart and extends over to drain into the inferior vena cava. No acute aortic findings.  Interventricular septal thickness of 2.1 cm compatible with left ventricular hypertrophy. Mediastinum/Nodes: There is edema in the upper chest bilaterally and tracking in the right axilla bilateral supraclavicular, right axillary right subpectoral, and mediastinal adenopathy. Index right supraclavicular node 1.3 cm in short axis on image 8/4, formerly the same. Index right axillary lymph node 1.6 cm in short axis on image 23/4, previously 1.3 cm. Index left prevascular node 1.0 cm in short axis on image 30/4, previously 1.1 cm. There is continued stranding in the anterior mediastinum. Lungs/Pleura: Right pleural thickening volume loss in the right lower lobe and right lower lobe with subpleural rounded densities with comet tail appearance favoring rounded atelectasis in the right middle lobe and in 2 locations in the right lower lobe. Hypoventilation related vaso constriction of the right middle lobe and right lower lobe pulmonary arteries noted. There is minimal atelectasis left lower lobe along the left hemidiaphragm. Upper Abdomen: Unremarkable Musculoskeletal: Unremarkable Review of the MIP images confirms the above findings. IMPRESSION: 1. No filling defect is identified in the pulmonary arterial tree to suggest pulmonary embolus. 2. Upper thoracic adenopathy, generally stable but with some increase in right axillary adenopathy. Cause is uncertain. 3. Chronic right pleural thickening with rounded atelectasis in the right middle lobe and right lower lobe 4. Subcutaneous edema along the anterior chest, the possibility of poor venous drainage or lymphedema is raised. 5. Left ventricular hypertrophy. 6. Left  dialysis catheter and right Hero graft noted. Electronically Signed   By: Van Clines M.D.   On: 05/02/2018 23:30   US Venous Img Upper Uni Right  Result Date: 05/03/2018 CLINICAL DATA:  Right arm pain and swelling history of dialysis with right upper arm dialysis graft. EXAM: RIGHT UPPER EXTREMITY  VENOUS DOPPLER ULTRASOUND TECHNIQUE: Gray-scale sonography with graded compression, as well as color Doppler and duplex ultrasound were performed to evaluate the upper extremity deep venous system from the level of the subclavian vein and including the jugular, axillary, basilic, radial, ulnar and upper cephalic vein. Spectral Doppler was utilized to evaluate flow at rest and with distal augmentation maneuvers. COMPARISON:  Right upper extremity venous Doppler ultrasound-04/10/2018; chest radiograph - 05/02/2018; chest CT - 05/02/2018 FINDINGS: Contralateral Subclavian Vein: Respiratory phasicity is normal and symmetric with the symptomatic side. No evidence of thrombus. Normal compressibility. Internal Jugular Vein: No evidence of thrombus. Normal compressibility, respiratory phasicity and response to augmentation. Subclavian Vein: Note is made of a minimal amount of nonocclusive wall thickening involving the right subclavian vein without evidence of thrombosis. Normal compressibility, respiratory phasicity and response to augmentation. Axillary Vein: No evidence of thrombus. Normal compressibility, respiratory phasicity and response to augmentation. Cephalic Vein: No evidence of thrombus. Normal compressibility, respiratory phasicity and response to augmentation. Basilic Vein: No evidence of thrombus. Normal compressibility, respiratory phasicity and response to augmentation. Brachial Veins: There is age-indeterminate echogenic occlusive thrombus within 1 of the paired right brachial veins (images 26 and 31). The adjacent dominant brachial vein appears widely patent. Radial Veins: There is echogenic occlusive thrombus within the 1 of the paired right radial veins (images 33 and 34). Ulnar Veins: No evidence of thrombus. Normal compressibility, respiratory phasicity and response to augmentation. Venous Reflux:  None visualized. Other Findings: There is a minimal subcutaneous edema at the level of the right upper  arm. There is a large amount of subcutaneous edema at the level of the medial aspect of the right forearm (image 35). IMPRESSION: 1. Examination is positive for age-indeterminate, though potentially chronic, occlusive DVT involving one of the paired right brachial and radial veins, not imaged on the 04/10/2018 examination, and of uncertain clinical significance given patient's history of known right upper extremity hero graft. 2. Moderate to large amount of subcutaneous edema within the right arm. Electronically Signed   By: Sandi Mariscal M.D.   On: 05/03/2018 10:26   US Venous Img Upper Uni Right  Result Date: 04/10/2018 CLINICAL DATA:  Right arm swelling for 1 day EXAM: RIGHT UPPER EXTREMITY VENOUS DOPPLER ULTRASOUND TECHNIQUE: Gray-scale sonography with graded compression, as well as color Doppler and duplex ultrasound were performed to evaluate the upper extremity deep venous system from the level of the subclavian vein and including the jugular, axillary, basilic, radial, ulnar and upper cephalic vein. Spectral Doppler was utilized to evaluate flow at rest and with distal augmentation maneuvers. COMPARISON:  None. FINDINGS: Contralateral Subclavian Vein: Hypoechoic nonocclusive thrombus in the left subclavian vein. Phasic flow preserved. This may be chronic given the history of extensive hemodialysis access sites and interventions. Internal Jugular Vein: No evidence of thrombus. Normal compressibility, respiratory phasicity and response to augmentation. Subclavian Vein: No evidence of thrombus. Normal compressibility, respiratory phasicity and response to augmentation. Axillary Vein: No evidence of thrombus. Normal compressibility, respiratory phasicity and response to augmentation. Cephalic Vein: No evidence of thrombus. Normal compressibility, respiratory phasicity and response to augmentation. Basilic Vein: No evidence of thrombus. Normal compressibility, respiratory phasicity and response to augmentation.  Brachial Veins: No evidence of thrombus. Normal compressibility, respiratory phasicity and response to augmentation. Radial Veins: No evidence of thrombus. Normal compressibility, respiratory phasicity and response to augmentation. Ulnar Veins: No evidence of thrombus. Normal compressibility, respiratory phasicity and response to augmentation. Venous Reflux:  None visualized. Other Findings: Right upper arm hero dialysis access is occluded/thrombosed. IMPRESSION: No evidence of significant DVT within the right upper extremity. Right upper arm hero dialysis access is thrombosed LEFT subclavian nonocclusive thrombus noted, this may be chronic given the extensive hemodialysis access sites and interventions. Electronically Signed   By: Jerilynn Mages.  Shick M.D.   On: 04/10/2018 09:48   Dg C-arm 1-60 Min-no Report  Result Date: 04/25/2018 Fluoroscopy was utilized by the requesting physician.  No radiographic interpretation.   Assessment/Plan The patient is a 52 year old male with a past medical history of end-stage renal disease on dialysis, congestive heart failure, hyperlipidemia, renal transplant recipient, anemia, hypertension who presented to the [facility] emergency department yesterday complaining of progressively worsening chest pain.  The patient is also status post a revision of his right hero graft on April 25, 2018. 1. Right Upper Extremity Edema: Moderate right upper extremity edema noted on ultrasound and physical exam.  There is no acute vascular compromise to the right upper extremity at this time.  The patient has a chronically occluded brachial artery which would account for the inability to palpate a right radial pulse.  The new DVT found on today's duplex: potentially chronic, occlusive DVT involving one of the paired right brachial and radial veins are inconsequential however can be a contributing factor to the patient's postoperative edema.  At this time, we do not recommend any  vascular intervention.  Recommend treating the patient's postoperative edema conservatively.  Recommend elevation and compression.  We will see the patient back in our office in approximately 1 week to closely follow his edema.  The patient has a past medical history of numerous dialysis access creation failures to the bilateral upper extremity.  He has limited options at this time.  Ligating his hero graft would be a last option.  Conservative treatment and close monitoring is highly recommended. ASA and Plavix for DVT. Can stop Heparin gtt.  2. ESRD: The patient is to continue to dialyze through his left IJ PermCath as his right upper extremity is to edematous to cannulized at this time 3. Hyperlipidemia: Encouraged good control as its slows the progression of atherosclerotic disease 4: Hypertension: Encouraged good control as its slows the progression of atherosclerotic disease  Discussed with Dr. Mayme Genta, PA-C  05/03/2018 1:09 PM  This note was created with Dragon medical transcription system.  Any error is purely unintentional.

## 2018-05-03 NOTE — Care Management Obs Status (Signed)
MEDICARE OBSERVATION STATUS NOTIFICATION   Patient Details  Name: DONYAE KILNER MRN: [id number] Date of Birth: 31-Mar-1966   Medicare Observation Status Notification Given:  Yes    Marshell Garfinkel, RN 05/03/2018, 2:11 PM

## 2018-05-03 NOTE — Progress Notes (Addendum)
Patient was transferred from the ER following c/o CP. On admission to the unit patient was A&O X4, denied pain, but has his right upper arm swollen and his AV fistula bleeding . New dressing was applied to the AV fistula. Dr. Duane Boston was notified of patient's fistula status. Patient has no acute event overnight, NSR on the monitor and VSS.

## 2018-05-04 DIAGNOSIS — N2581 Secondary hyperparathyroidism of renal origin: Secondary | ICD-10-CM | POA: Diagnosis not present

## 2018-05-04 DIAGNOSIS — R079 Chest pain, unspecified: Secondary | ICD-10-CM | POA: Diagnosis not present

## 2018-05-04 DIAGNOSIS — D631 Anemia in chronic kidney disease: Secondary | ICD-10-CM | POA: Diagnosis not present

## 2018-05-04 DIAGNOSIS — I120 Hypertensive chronic kidney disease with stage 5 chronic kidney disease or end stage renal disease: Secondary | ICD-10-CM | POA: Diagnosis not present

## 2018-05-04 DIAGNOSIS — I10 Essential (primary) hypertension: Secondary | ICD-10-CM | POA: Diagnosis not present

## 2018-05-04 DIAGNOSIS — S40029A Contusion of unspecified upper arm, initial encounter: Secondary | ICD-10-CM | POA: Diagnosis not present

## 2018-05-04 DIAGNOSIS — N186 End stage renal disease: Secondary | ICD-10-CM | POA: Diagnosis not present

## 2018-05-04 LAB — RENAL FUNCTION PANEL
ALBUMIN: 3 g/dL — AB (ref 3.5–5.0)
ANION GAP: 11 (ref 5–15)
BUN: 26 mg/dL — ABNORMAL HIGH (ref 6–20)
CHLORIDE: 95 mmol/L — AB (ref 98–111)
CO2: 31 mmol/L (ref 22–32)
Calcium: 8.5 mg/dL — ABNORMAL LOW (ref 8.9–10.3)
Creatinine, Ser: 10.38 mg/dL — ABNORMAL HIGH (ref 0.61–1.24)
GFR calc Af Amer: 6 mL/min — ABNORMAL LOW (ref >60)
GFR calc non Af Amer: 5 mL/min — ABNORMAL LOW (ref >60)
GLUCOSE: 89 mg/dL (ref 70–99)
PHOSPHORUS: 4.7 mg/dL — AB (ref 2.5–4.6)
POTASSIUM: 3.8 mmol/L (ref 3.5–5.1)
Sodium: 137 mmol/L (ref 135–145)

## 2018-05-04 LAB — HEMOGLOBIN: HEMOGLOBIN: 7.7 g/dL — AB (ref 13.0–18.0)

## 2018-05-04 MED ORDER — ACETAMINOPHEN 325 MG PO TABS: 650.0000 mg | ORAL_TABLET | Freq: Four times a day (QID) | ORAL | Status: DC | PRN

## 2018-05-04 MED ORDER — OXYCODONE-ACETAMINOPHEN 5-325 MG PO TABS
1.0000 | ORAL_TABLET | ORAL | Status: DC | PRN
  Administered 2018-05-04: 1 via ORAL
  Filled 2018-05-04: qty 1

## 2018-05-04 MED ORDER — CLOPIDOGREL BISULFATE 75 MG PO TABS: 75.0000 mg | ORAL_TABLET | Freq: Every day | ORAL | 0 refills | Status: DC

## 2018-05-04 NOTE — Progress Notes (Signed)
Vascular Surgery  Right arm hero graft with completely new right central venous portion and a new PTFE portion anastomosed to the previous PTFE portion of the hero graft after thrombectomy on 04/25/2018.   Per patient has had mild oozing from mid arm incision since the surgery.  Evaluated patient last night secondary to reports of bleeding from incision.  Upon exam: small skin tear at mid arm incision. No active bleeding or oozing.    Faint bruit noted.  Unable to palpate thrill due to edematous arm. Arm/hand warm. Incisions C/D/I  Perma cath in place, no erythema  Continue arm elevation, gentle compression. Monitor skin tear.  No intervention required per Vascular Surgery.  Follow Up in office in 1 week.

## 2018-05-04 NOTE — Discharge Summary (Signed)
[facility] [place] NAME: Jonathon Conley    MR#:  [id number]  DATE OF BIRTH:  [birth year]  DATE OF ADMISSION:  05/02/2018 ADMITTING PHYSICIAN: Amelia Jo, MD  DATE OF DISCHARGE: 05/04/2018 11:55 AM  PRIMARY CARE PHYSICIAN: Tresa Garter, MD    ADMISSION DIAGNOSIS:  Peripheral edema [R60.9] Swelling of arm [M79.89] Chest pain, unspecified type [R07.9] Chest pain [R07.9]  DISCHARGE DIAGNOSIS:  Active Problems:   Acute respiratory distress   Chest pain   SECONDARY DIAGNOSIS:   Past Medical History:  Diagnosis Date  . Anemia   . Asthma    childhood  . Blood transfusion without reported diagnosis   . CHF (congestive heart failure) ([place])   . Chronic kidney disease    Dialysis Mon, Wed, Fri  . Colitis   . COPD (chronic obstructive pulmonary disease) ([place])   . Diverticulosis 04/12/16   also seen: sigmoid and rectal erythema, path:   Marland Kitchen Dyspnea   . Heart murmur   . Hemorrhoids 03/2016   bleeding at 04/12/16 colonoscopy, treated with  APC laser. cauterization  . Hyperlipidemia   . Hypertension   . OSA (obstructive sleep apnea) 03/27/2013    HOSPITAL COURSE:   1.  Atypical chest pain.  CT angiogram of the chest showed no pulmonary embolism.  There was some subcutaneous edema along the anterior chest with possibility of poor venous drainage or lymphedema is raised.  Patient was feeling better and wanted to go home because he had a family emergency.  The patient is on Coreg losartan and aspirin already. 2.  Right upper extremity DVT and hematoma with recent hero graft revision.  Seen by vascular surgery and they did not recommend anticoagulation.  They recommended aspirin and Plavix. 3.  End-stage renal disease received hemodialysis yesterday. 4.  Anemia of chronic disease.  With bleeding from the hero graft site I rechecked the hemoglobin of 7.7 upon discharge.  Epogen with dialysis.  Continue ferrous sulfate. 5.  Chronic  thrombocytopenia 6.  Essential hypertension continue Norvasc Coreg losartan and hydralazine 7.  Hyperlipidemia unspecified.  With the patient's body aches and weakness I advised him to stop his Zocor for 1 week.  If all these pains go away it secondary to the Zocor and continue stopping this medication.  DISCHARGE CONDITIONS:   Satisfactory  CONSULTS OBTAINED:  Treatment Team:  Schnier, Dolores Lory, MD Lavonia Dana, MD  DRUG ALLERGIES:   Allergies  Allergen Reactions  . Lisinopril Swelling    Facial swelling    DISCHARGE MEDICATIONS:   Allergies as of 05/04/2018      Reactions   Lisinopril Swelling   Facial swelling      Medication List    STOP taking these medications   HYDROcodone-acetaminophen 5-325 MG tablet Commonly known as:  NORCO   simvastatin 5 MG tablet Commonly known as:  ZOCOR     TAKE these medications   acetaminophen 325 MG tablet Commonly known as:  TYLENOL Take 2 tablets (650 mg total) by mouth every 6 (six) hours as needed for mild pain (or Fever >/= 101).   amLODipine 10 MG tablet Commonly known as:  NORVASC Take 10 mg by mouth daily.   aspirin EC 81 MG tablet Take 81 mg by mouth daily.   benzonatate 100 MG capsule Commonly known as:  TESSALON Take 100 mg by mouth 2 (two) times daily as needed for cough.   calcitRIOL 0.5 MCG capsule Commonly known as:  ROCALTROL Take  1 capsule (0.5 mcg total) by mouth Every Tuesday,Thursday,and Saturday with dialysis.   calcium acetate 667 MG capsule Commonly known as:  PHOSLO Take 667 mg by mouth 3 (three) times daily with meals.   carvedilol 25 MG tablet Commonly known as:  COREG Take 25 mg by mouth 2 (two) times daily.   clopidogrel 75 MG tablet Commonly known as:  PLAVIX Take 1 tablet (75 mg total) by mouth daily. Start taking on:  05/05/2018   ferrous sulfate 325 (65 FE) MG tablet Take 325 mg by mouth 3 (three) times daily with meals.   hydrALAZINE 25 MG tablet Commonly known as:   APRESOLINE Take 1 tablet (25 mg total) by mouth 3 (three) times daily.   losartan 100 MG tablet Commonly known as:  COZAAR Take 1 tablet (100 mg total) by mouth daily.   Mesalamine 800 MG Tbec Take 1 tablet (800 mg total) by mouth 3 (three) times daily.        DISCHARGE INSTRUCTIONS:   Follow-up PMD 6 days Continue dialysis as scheduled Follow-up with vascular surgery  If you experience worsening of your admission symptoms, develop shortness of breath, life threatening emergency, suicidal or homicidal thoughts you must seek medical attention immediately by calling 911 or calling your MD immediately  if symptoms less severe.  You Must read complete instructions/literature along with all the possible adverse reactions/side effects for all the Medicines you take and that have been prescribed to you. Take any new Medicines after you have completely understood and accept all the possible adverse reactions/side effects.   Please note  You were cared for by a hospitalist during your hospital stay. If you have any questions about your discharge medications or the care you received while you were in the hospital after you are discharged, you can call the unit and asked to speak with the hospitalist on call if the hospitalist that took care of you is not available. Once you are discharged, your primary care physician will handle any further medical issues. Please note that NO REFILLS for any discharge medications will be authorized once you are discharged, as it is imperative that you return to your primary care physician (or establish a relationship with a primary care physician if you do not have one) for your aftercare needs so that they can reassess your need for medications and monitor your lab values.    Today   CHIEF COMPLAINT:   Chief Complaint  Patient presents with  . Chest Pain    HISTORY OF PRESENT ILLNESS:  Jonathon Conley  is a 52 y.o. male presented with chest  pain.   VITAL SIGNS:  Blood pressure (!) 139/98, pulse 93, temperature 97.7 F (36.5 C), temperature source Oral, resp. rate 18, height 5' 10"  (1.778 m), weight 79.3 kg (174 lb 14.4 oz), SpO2 97 %.    PHYSICAL EXAMINATION:  GENERAL:  52 y.o.-year-old patient lying in the bed with no acute distress.  EYES: Pupils equal, round, reactive to light and accommodation. No scleral icterus. Extraocular muscles intact.  HEENT: Head atraumatic, normocephalic. Oropharynx and nasopharynx clear.  NECK:  Supple, no jugular venous distention. No thyroid enlargement, no tenderness.  LUNGS: Normal breath sounds bilaterally, no wheezing, rales,rhonchi or crepitation. No use of accessory muscles of respiration.  CARDIOVASCULAR: S1, S2 normal. No murmurs, rubs, or gallops.  ABDOMEN: Soft, non-tender, non-distended. Bowel sounds present. No organomegaly or mass.  EXTREMITIES: No pedal edema, cyanosis, or clubbing.  NEUROLOGIC: Cranial nerves II through XII are intact.  Muscle strength 5/5 in all extremities. Sensation intact. Gait not checked.  PSYCHIATRIC: The patient is alert and oriented x 3.  SKIN: No obvious rash, lesion, or ulcer.  Pressure dressing over right hero graft  DATA REVIEW:   CBC Recent Labs  Lab 05/03/18 0510 05/04/18 0814  WBC 3.9  --   HGB 7.9* 7.7*  HCT 23.3*  --   PLT 137*  --     Chemistries  Recent Labs  Lab 05/04/18 0814  NA 137  K 3.8  CL 95*  CO2 31  GLUCOSE 89  BUN 26*  CREATININE 10.38*  CALCIUM 8.5*    Cardiac Enzymes Recent Labs  Lab 05/03/18 1703  TROPONINI <0.03    Microbiology Results  Results for orders placed or performed during the hospital encounter of 04/23/18  Surgical pcr screen     Status: None   Collection Time: 04/23/18  2:41 PM  Result Value Ref Range Status   MRSA, PCR NEGATIVE NEGATIVE Final   Staphylococcus aureus NEGATIVE NEGATIVE Final    Comment: (NOTE) The Xpert SA Assay (FDA approved for NASAL specimens in patients  52 years of age and older), is one component of a comprehensive surveillance program. It is not intended to diagnose infection nor to guide or monitor treatment. Performed at [facility], [address] [place], [place] [address]     RADIOLOGY:  Dg Chest 2 View  Result Date: 05/02/2018 CLINICAL DATA:  Chest pain today EXAM: CHEST - 2 VIEW COMPARISON:  April 09, 2018 FINDINGS: The heart size and mediastinal contours are within normal limits. Bilateral central venous lines are unchanged. Chronic pleural effusions/pleural parenchymal thickening of the right lung base are stable. The left lung is clear. The visualized skeletal structures are unremarkable. IMPRESSION: No active cardiopulmonary disease. Stable chronic changes of right lung. Electronically Signed   By: Abelardo Diesel M.D.   On: 05/02/2018 17:25   Ct Angio Chest Pe W And/or Wo Contrast  Result Date: 05/02/2018 CLINICAL DATA:  Chest pain starting today EXAM: CT ANGIOGRAPHY CHEST WITH CONTRAST TECHNIQUE: Multidetector CT imaging of the chest was performed using the standard protocol during bolus administration of intravenous contrast. Multiplanar CT image reconstructions and MIPs were obtained to evaluate the vascular anatomy. CONTRAST:  82m ISOVUE-370 IOPAMIDOL (ISOVUE-370) INJECTION 76% COMPARISON:  03/13/2018 chest radiograph from 05/02/2018 FINDINGS: Cardiovascular: No filling defect is identified in the pulmonary arterial tree to suggest pulmonary embolus. Left central line terminates in the right atrium. Right central line terminates in the right atrium. There is a persistent left supra coronal vein which tracks below the heart and extends over to drain into the inferior vena cava. No acute aortic findings. Interventricular septal thickness of 2.1 cm compatible with left ventricular hypertrophy. Mediastinum/Nodes: There is edema in the upper chest bilaterally and tracking in the right axilla bilateral supraclavicular, right  axillary right subpectoral, and mediastinal adenopathy. Index right supraclavicular node 1.3 cm in short axis on image 8/4, formerly the same. Index right axillary lymph node 1.6 cm in short axis on image 23/4, previously 1.3 cm. Index left prevascular node 1.0 cm in short axis on image 30/4, previously 1.1 cm. There is continued stranding in the anterior mediastinum. Lungs/Pleura: Right pleural thickening volume loss in the right lower lobe and right lower lobe with subpleural rounded densities with comet tail appearance favoring rounded atelectasis in the right middle lobe and in 2 locations in the right lower lobe. Hypoventilation related vaso constriction of the right middle lobe  and right lower lobe pulmonary arteries noted. There is minimal atelectasis left lower lobe along the left hemidiaphragm. Upper Abdomen: Unremarkable Musculoskeletal: Unremarkable Review of the MIP images confirms the above findings. IMPRESSION: 1. No filling defect is identified in the pulmonary arterial tree to suggest pulmonary embolus. 2. Upper thoracic adenopathy, generally stable but with some increase in right axillary adenopathy. Cause is uncertain. 3. Chronic right pleural thickening with rounded atelectasis in the right middle lobe and right lower lobe 4. Subcutaneous edema along the anterior chest, the possibility of poor venous drainage or lymphedema is raised. 5. Left ventricular hypertrophy. 6. Left dialysis catheter and right Hero graft noted. Electronically Signed   By: Van Clines M.D.   On: 05/02/2018 23:30   US Venous Img Upper Uni Right  Result Date: 05/03/2018 CLINICAL DATA:  Right arm pain and swelling history of dialysis with right upper arm dialysis graft. EXAM: RIGHT UPPER EXTREMITY VENOUS DOPPLER ULTRASOUND TECHNIQUE: Gray-scale sonography with graded compression, as well as color Doppler and duplex ultrasound were performed to evaluate the upper extremity deep venous system from the level of the  subclavian vein and including the jugular, axillary, basilic, radial, ulnar and upper cephalic vein. Spectral Doppler was utilized to evaluate flow at rest and with distal augmentation maneuvers. COMPARISON:  Right upper extremity venous Doppler ultrasound-04/10/2018; chest radiograph - 05/02/2018; chest CT - 05/02/2018 FINDINGS: Contralateral Subclavian Vein: Respiratory phasicity is normal and symmetric with the symptomatic side. No evidence of thrombus. Normal compressibility. Internal Jugular Vein: No evidence of thrombus. Normal compressibility, respiratory phasicity and response to augmentation. Subclavian Vein: Note is made of a minimal amount of nonocclusive wall thickening involving the right subclavian vein without evidence of thrombosis. Normal compressibility, respiratory phasicity and response to augmentation. Axillary Vein: No evidence of thrombus. Normal compressibility, respiratory phasicity and response to augmentation. Cephalic Vein: No evidence of thrombus. Normal compressibility, respiratory phasicity and response to augmentation. Basilic Vein: No evidence of thrombus. Normal compressibility, respiratory phasicity and response to augmentation. Brachial Veins: There is age-indeterminate echogenic occlusive thrombus within 1 of the paired right brachial veins (images 26 and 31). The adjacent dominant brachial vein appears widely patent. Radial Veins: There is echogenic occlusive thrombus within the 1 of the paired right radial veins (images 33 and 34). Ulnar Veins: No evidence of thrombus. Normal compressibility, respiratory phasicity and response to augmentation. Venous Reflux:  None visualized. Other Findings: There is a minimal subcutaneous edema at the level of the right upper arm. There is a large amount of subcutaneous edema at the level of the medial aspect of the right forearm (image 35). IMPRESSION: 1. Examination is positive for age-indeterminate, though potentially chronic, occlusive DVT  involving one of the paired right brachial and radial veins, not imaged on the 04/10/2018 examination, and of uncertain clinical significance given patient's history of known right upper extremity hero graft. 2. Moderate to large amount of subcutaneous edema within the right arm. Electronically Signed   By: Sandi Mariscal M.D.   On: 05/03/2018 10:26     Management plans discussed with the patient, nephrology and they are in agreement.  CODE STATUS:     Code Status Orders  (From admission, onward)        Start     Ordered   05/02/18 2336  Full code  Continuous     05/02/18 2335    Code Status History    Date Active Date Inactive Code Status Order ID Comments User Context   04/09/2018 1625 04/11/2018  [place] Full Code [id number]  Dustin Flock, MD Inpatient   03/13/2018 0123 03/14/2018 1716 Full Code [id number]  Arta Silence, MD Inpatient   12/18/2016 0150 12/19/2016 2105 Full Code [id number]  Harvie Bridge, DO Inpatient   10/02/2016 1011 10/02/2016 1418 Full Code [id number]  Algernon Huxley, MD Inpatient   04/08/2016 1412 04/12/2016 1955 Full Code [id number]  Norman Herrlich, MD ED   12/14/2015 2223 12/16/2015 1423 Full Code [id number]  Demetrios Loll, MD Inpatient   11/03/2015 0211 11/04/2015 1739 Full Code [id number]  Lily Kocher, MD Inpatient   09/21/2015 1930 09/24/2015 1613 Full Code [id number]  Vianne Bulls, MD ED   02/14/2015 2341 02/17/2015 1814 Full Code [id number]  Lavina Hamman, MD ED      TOTAL TIME TAKING CARE OF THIS PATIENT: 35 minutes.    Loletha Grayer M.D on 05/04/2018 at 2:01 PM  Between 7am to 6pm - Pager - [phone]  After 6pm go to [email or web address] - password [email or web address]  [facility] Office  [phone]  CC: Primary care physician; Tresa Garter, MD

## 2018-05-04 NOTE — Discharge Instructions (Signed)
Nonspecific Chest Pain °Chest pain can be caused by many different conditions. There is always a chance that your pain could be related to something serious, such as a heart attack or a blood clot in your lungs. Chest pain can also be caused by conditions that are not life-threatening. If you have chest pain, it is very important to follow up with your health care provider. °What are the causes? °Causes of this condition include: °· Heartburn. °· Pneumonia or bronchitis. °· Anxiety or stress. °· Inflammation around your heart (pericarditis) or lung (pleuritis or pleurisy). °· A blood clot in your lung. °· A collapsed lung (pneumothorax). This can develop suddenly on its own (spontaneous pneumothorax) or from trauma to the chest. °· Shingles infection (varicella-zoster virus). °· Heart attack. °· Damage to the bones, muscles, and cartilage that make up your chest wall. This can include: °? Bruised bones due to injury. °? Strained muscles or cartilage due to frequent or repeated coughing or overwork. °? Fracture to one or more ribs. °? Sore cartilage due to inflammation (costochondritis). ° °What increases the risk? °Risk factors for this condition may include: °· Activities that increase your risk for trauma or injury to your chest. °· Respiratory infections or conditions that cause frequent coughing. °· Medical conditions or overeating that can cause heartburn. °· Heart disease or family history of heart disease. °· Conditions or health behaviors that increase your risk of developing a blood clot. °· Having had chicken pox (varicella zoster). ° °What are the signs or symptoms? °Chest pain can feel like: °· Burning or tingling on the surface of your chest or deep in your chest. °· Crushing, pressure, aching, or squeezing pain. °· Dull or sharp pain that is worse when you move, cough, or take a deep breath. °· Pain that is also felt in your back, neck, shoulder, or arm, or pain that spreads to any of these  areas. ° °Your chest pain may come and go, or it may stay constant. °How is this diagnosed? °Lab tests or other studies may be needed to find the cause of your pain. Your health care provider may have you take a test called an ECG (electrocardiogram). An ECG records your heartbeat patterns at the time the test is performed. You may also have other tests, such as: °· Transthoracic echocardiogram (TTE). In this test, sound waves are used to create a picture of the heart structures and to look at how blood flows through your heart. °· Transesophageal echocardiogram (TEE). This is a more advanced imaging test that takes images from inside your body. It allows your health care provider to see your heart in finer detail. °· Cardiac monitoring. This allows your health care provider to monitor your heart rate and rhythm in real time. °· Holter monitor. This is a portable device that records your heartbeat and can help to diagnose abnormal heartbeats. It allows your health care provider to track your heart activity for several days, if needed. °· Stress tests. These can be done through exercise or by taking medicine that makes your heart beat more quickly. °· Blood tests. °· Other imaging tests. ° °How is this treated? °Treatment depends on what is causing your chest pain. Treatment may include: °· Medicines. These may include: °? Acid blockers for heartburn. °? Anti-inflammatory medicine. °? Pain medicine for inflammatory conditions. °? Antibiotic medicine, if an infection is present. °? Medicines to dissolve blood clots. °? Medicines to treat coronary artery disease (CAD). °· Supportive care for conditions that   do not require medicines. This may include: °? Resting. °? Applying heat or cold packs to injured areas. °? Limiting activities until pain decreases. ° °Follow these instructions at home: °Medicines °· If you were prescribed an antibiotic, take it as told by your health care provider. Do not stop taking the  antibiotic even if you start to feel better. °· Take over-the-counter and prescription medicines only as told by your health care provider. °Lifestyle °· Do not use any products that contain nicotine or tobacco, such as cigarettes and e-cigarettes. If you need help quitting, ask your health care provider. °· Do not drink alcohol. °· Make lifestyle changes as directed by your health care provider. These may include: °? Getting regular exercise. Ask your health care provider to suggest some activities that are safe for you. °? Eating a heart-healthy diet. A registered dietitian can help you to learn healthy eating options. °? Maintaining a healthy weight. °? Managing diabetes, if necessary. °? Reducing stress, such as with yoga or relaxation techniques. °General instructions °· Avoid any activities that bring on chest pain. °· If heartburn is the cause for your chest pain, raise (elevate) the head of your bed about 6 inches (15 cm) by putting blocks under the legs. Sleeping with more pillows does not effectively relieve heartburn because it only changes the position of your head. °· Keep all follow-up visits as told by your health care provider. This is important. This includes any further testing if your chest pain does not go away. °Contact a health care provider if: °· Your chest pain does not go away. °· You have a rash with blisters on your chest. °· You have a fever. °· You have chills. °Get help right away if: °· Your chest pain is worse. °· You have a cough that gets worse, or you cough up blood. °· You have severe pain in your abdomen. °· You have severe weakness. °· You faint. °· You have sudden, unexplained chest discomfort. °· You have sudden, unexplained discomfort in your arms, back, neck, or jaw. °· You have shortness of breath at any time. °· You suddenly start to sweat, or your skin gets clammy. °· You feel nauseous or you vomit. °· You suddenly feel light-headed or dizzy. °· Your heart begins to beat  quickly, or it feels like it is skipping beats. °These symptoms may represent a serious problem that is an emergency. Do not wait to see if the symptoms will go away. Get medical help right away. Call your local emergency services (911 in the [place] Do not drive yourself to the hospital. °This information is not intended to replace advice given to you by your health care provider. Make sure you discuss any questions you have with your health care provider. °Document Released: 07/12/2005 Document Revised: 06/26/2016 Document Reviewed: 06/26/2016 °Elsevier Interactive Patient Education © 2017 [employer] ° °

## 2018-05-04 NOTE — Progress Notes (Signed)
On initial assessment patient fistula dressing saturated by blood. Contacted dialysis RN. Recommended holding pressure to site. No improvement. MD notified. Pressure dressing placed. MD assessed patient. Some blood apparent under dressing but not leaking. Will continue to monitor.

## 2018-05-04 NOTE — Progress Notes (Signed)
[facility]  ROUNDING NOTE   Subjective:   Hemodialysis treatment yesterday. Tolerated treatment well. Treatment through permcath. UF of 2 liters  Bleeding from AVG   Objective:  Vital signs in last 24 hours:  Temp:  [97.7 F (36.5 C)-101.4 F (38.6 C)] 97.7 F (36.5 C) (07/20 0739) Pulse Rate:  [72-111] 93 (07/20 0739) Resp:  [12-18] 18 (07/20 0739) BP: (93-211)/(49-115) 139/98 (07/20 0739) SpO2:  [97 %-100 %] 97 % (07/20 0739) Weight:  [79.3 kg (174 lb 14.4 oz)] 79.3 kg (174 lb 14.4 oz) (07/20 0500)  Weight change: -2.087 kg (-4 lb 9.6 oz) Filed Weights   05/02/18 2341 05/03/18 0331 05/04/18 0500  Weight: 81.6 kg (179 lb 14.4 oz) 81.6 kg (180 lb) 79.3 kg (174 lb 14.4 oz)    Intake/Output: I/O last 3 completed shifts: In: 240 [P.O.:240] Out: 2000 [Other:2000]   Intake/Output this shift:  No intake/output data recorded.  Physical Exam: General: NAD,   Head: Normocephalic, atraumatic. Moist oral mucosal membranes  Eyes: Anicteric, PERRL  Neck: Supple, trachea midline  Lungs:  Clear to auscultation  Heart: Regular rate and rhythm  Abdomen:  Soft, nontender,   Extremities: Right upper extremity edema  Neurologic: Nonfocal, moving all four extremities  Skin: No lesions  Access: LIJ permcath, Right Hero graft placed on 7/11 Dr. Lucky Cowboy    Basic Metabolic Panel: Recent Labs  Lab 05/02/18 1902 05/03/18 1100 05/04/18 0814  NA 140 137 137  K 4.1 4.1 3.8  CL 97* 94* 95*  CO2 30 30 31   GLUCOSE 96 122* 89  BUN 42* 43* 26*  CREATININE 14.87* 14.67* 10.38*  CALCIUM 8.5* 8.5* 8.5*  PHOS  --  5.6* 4.7*    Liver Function Tests: Recent Labs  Lab 05/03/18 1100 05/04/18 0814  ALBUMIN 3.4* 3.0*   No results for input(s): LIPASE, AMYLASE in the last 168 hours. No results for input(s): AMMONIA in the last 168 hours.  CBC: Recent Labs  Lab 05/02/18 1902 05/03/18 0510 05/04/18 0814  WBC 4.0 3.9  --   HGB 8.4* 7.9* 7.7*  HCT 25.0* 23.3*  --   MCV  86.6 86.7  --   PLT 136* 137*  --     Cardiac Enzymes: Recent Labs  Lab 05/02/18 1902 05/03/18 1040 05/03/18 1703  TROPONINI <0.03 <0.03 <0.03    BNP: Invalid input(s): POCBNP  CBG: No results for input(s): GLUCAP in the last 168 hours.  Microbiology: Results for orders placed or performed during the hospital encounter of 04/23/18  Surgical pcr screen     Status: None   Collection Time: 04/23/18  2:41 PM  Result Value Ref Range Status   MRSA, PCR NEGATIVE NEGATIVE Final   Staphylococcus aureus NEGATIVE NEGATIVE Final    Comment: (NOTE) The Xpert SA Assay (FDA approved for NASAL specimens in patients 43 years of age and older), is one component of a comprehensive surveillance program. It is not intended to diagnose infection nor to guide or monitor treatment. Performed at [facility], [address] [place], [place] [address]     Coagulation Studies: No results for input(s): LABPROT, INR in the last 72 hours.  Urinalysis: No results for input(s): COLORURINE, LABSPEC, PHURINE, GLUCOSEU, HGBUR, BILIRUBINUR, KETONESUR, PROTEINUR, UROBILINOGEN, NITRITE, LEUKOCYTESUR in the last 72 hours.  Invalid input(s): APPERANCEUR    Imaging: Dg Chest 2 View  Result Date: 05/02/2018 CLINICAL DATA:  Chest pain today EXAM: CHEST - 2 VIEW COMPARISON:  April 09, 2018 FINDINGS: The heart size  and mediastinal contours are within normal limits. Bilateral central venous lines are unchanged. Chronic pleural effusions/pleural parenchymal thickening of the right lung base are stable. The left lung is clear. The visualized skeletal structures are unremarkable. IMPRESSION: No active cardiopulmonary disease. Stable chronic changes of right lung. Electronically Signed   By: Abelardo Diesel M.D.   On: 05/02/2018 17:25   Ct Angio Chest Pe W And/or Wo Contrast  Result Date: 05/02/2018 CLINICAL DATA:  Chest pain starting today EXAM: CT ANGIOGRAPHY CHEST WITH CONTRAST TECHNIQUE: Multidetector  CT imaging of the chest was performed using the standard protocol during bolus administration of intravenous contrast. Multiplanar CT image reconstructions and MIPs were obtained to evaluate the vascular anatomy. CONTRAST:  11m ISOVUE-370 IOPAMIDOL (ISOVUE-370) INJECTION 76% COMPARISON:  03/13/2018 chest radiograph from 05/02/2018 FINDINGS: Cardiovascular: No filling defect is identified in the pulmonary arterial tree to suggest pulmonary embolus. Left central line terminates in the right atrium. Right central line terminates in the right atrium. There is a persistent left supra coronal vein which tracks below the heart and extends over to drain into the inferior vena cava. No acute aortic findings. Interventricular septal thickness of 2.1 cm compatible with left ventricular hypertrophy. Mediastinum/Nodes: There is edema in the upper chest bilaterally and tracking in the right axilla bilateral supraclavicular, right axillary right subpectoral, and mediastinal adenopathy. Index right supraclavicular node 1.3 cm in short axis on image 8/4, formerly the same. Index right axillary lymph node 1.6 cm in short axis on image 23/4, previously 1.3 cm. Index left prevascular node 1.0 cm in short axis on image 30/4, previously 1.1 cm. There is continued stranding in the anterior mediastinum. Lungs/Pleura: Right pleural thickening volume loss in the right lower lobe and right lower lobe with subpleural rounded densities with comet tail appearance favoring rounded atelectasis in the right middle lobe and in 2 locations in the right lower lobe. Hypoventilation related vaso constriction of the right middle lobe and right lower lobe pulmonary arteries noted. There is minimal atelectasis left lower lobe along the left hemidiaphragm. Upper Abdomen: Unremarkable Musculoskeletal: Unremarkable Review of the MIP images confirms the above findings. IMPRESSION: 1. No filling defect is identified in the pulmonary arterial tree to suggest  pulmonary embolus. 2. Upper thoracic adenopathy, generally stable but with some increase in right axillary adenopathy. Cause is uncertain. 3. Chronic right pleural thickening with rounded atelectasis in the right middle lobe and right lower lobe 4. Subcutaneous edema along the anterior chest, the possibility of poor venous drainage or lymphedema is raised. 5. Left ventricular hypertrophy. 6. Left dialysis catheter and right Hero graft noted. Electronically Signed   By: WVan ClinesM.D.   On: 05/02/2018 23:30   [place]Venous Img Upper Uni Right  Result Date: 05/03/2018 CLINICAL DATA:  Right arm pain and swelling history of dialysis with right upper arm dialysis graft. EXAM: RIGHT UPPER EXTREMITY VENOUS DOPPLER ULTRASOUND TECHNIQUE: Gray-scale sonography with graded compression, as well as color Doppler and duplex ultrasound were performed to evaluate the upper extremity deep venous system from the level of the subclavian vein and including the jugular, axillary, basilic, radial, ulnar and upper cephalic vein. Spectral Doppler was utilized to evaluate flow at rest and with distal augmentation maneuvers. COMPARISON:  Right upper extremity venous Doppler ultrasound-04/10/2018; chest radiograph - 05/02/2018; chest CT - 05/02/2018 FINDINGS: Contralateral Subclavian Vein: Respiratory phasicity is normal and symmetric with the symptomatic side. No evidence of thrombus. Normal compressibility. Internal Jugular Vein: No evidence of thrombus. Normal compressibility, respiratory phasicity  and response to augmentation. Subclavian Vein: Note is made of a minimal amount of nonocclusive wall thickening involving the right subclavian vein without evidence of thrombosis. Normal compressibility, respiratory phasicity and response to augmentation. Axillary Vein: No evidence of thrombus. Normal compressibility, respiratory phasicity and response to augmentation. Cephalic Vein: No evidence of thrombus. Normal compressibility,  respiratory phasicity and response to augmentation. Basilic Vein: No evidence of thrombus. Normal compressibility, respiratory phasicity and response to augmentation. Brachial Veins: There is age-indeterminate echogenic occlusive thrombus within 1 of the paired right brachial veins (images 26 and 31). The adjacent dominant brachial vein appears widely patent. Radial Veins: There is echogenic occlusive thrombus within the 1 of the paired right radial veins (images 33 and 34). Ulnar Veins: No evidence of thrombus. Normal compressibility, respiratory phasicity and response to augmentation. Venous Reflux:  None visualized. Other Findings: There is a minimal subcutaneous edema at the level of the right upper arm. There is a large amount of subcutaneous edema at the level of the medial aspect of the right forearm (image 35). IMPRESSION: 1. Examination is positive for age-indeterminate, though potentially chronic, occlusive DVT involving one of the paired right brachial and radial veins, not imaged on the 04/10/2018 examination, and of uncertain clinical significance given patient's history of known right upper extremity hero graft. 2. Moderate to large amount of subcutaneous edema within the right arm. Electronically Signed   By: Sandi Mariscal M.D.   On: 05/03/2018 10:26     Medications:    . amLODipine  10 mg Oral Daily  . aspirin EC  81 mg Oral Daily  . calcitRIOL  0.5 mcg Oral Q T,Th,Sa-HD  . calcium acetate  667 mg Oral TID WC  . carvedilol  25 mg Oral BID  . Chlorhexidine Gluconate Cloth  6 each Topical Q0600  . clopidogrel  75 mg Oral Daily  . docusate sodium  100 mg Oral BID  . epoetin (EPOGEN/PROCRIT) injection  10,000 Units Intravenous Q M,W,F-HD  . ferrous sulfate  325 mg Oral TID WC  . hydrALAZINE  25 mg Oral TID  . losartan  100 mg Oral Daily  . Mesalamine  800 mg Oral TID   acetaminophen **OR** acetaminophen, benzonatate, bisacodyl, hydrALAZINE, HYDROmorphone (DILAUDID) injection,  ondansetron **OR** ondansetron (ZOFRAN) IV, oxyCODONE-acetaminophen  Assessment/ Plan:  Jonathon Conley is a 52 y.o. black male with end-stage renal disease on hemodialysis, hemodialysis from 1997 -2001, then with decease renal transplant until October 2016.  Past medical history of hypertension, anemia, hyperlipidemia and angioedema with lisinopril.   [facility]/MWF left permcath, maturing right hero graft  1. ESRD on HD MWF. Tolerated hemodialysis treatment yesterday.  Complication of hemodialysis device.  - Appreciate vascular input - Continue MWF schedule  2. Right upper extremity DVT - new diagnosis on this admission.   3. Anemia with renal failure:    - EPO with HD treatment.   4. Hypertension: urgent on admission.  - IV hydralazine PRN - amlodipine, carvedilol, hydralazine, losartan  5. Secondary Hyperparathyroidism:  - Calcium acetate with meals.    LOS: 1 Karthika Glasper 7/20/201911:33 AM

## 2018-05-06 DIAGNOSIS — D631 Anemia in chronic kidney disease: Secondary | ICD-10-CM | POA: Diagnosis not present

## 2018-05-06 DIAGNOSIS — N2581 Secondary hyperparathyroidism of renal origin: Secondary | ICD-10-CM | POA: Diagnosis not present

## 2018-05-06 DIAGNOSIS — N186 End stage renal disease: Secondary | ICD-10-CM | POA: Diagnosis not present

## 2018-05-08 DIAGNOSIS — D631 Anemia in chronic kidney disease: Secondary | ICD-10-CM | POA: Diagnosis not present

## 2018-05-08 DIAGNOSIS — N186 End stage renal disease: Secondary | ICD-10-CM | POA: Diagnosis not present

## 2018-05-08 DIAGNOSIS — N2581 Secondary hyperparathyroidism of renal origin: Secondary | ICD-10-CM | POA: Diagnosis not present

## 2018-05-13 DIAGNOSIS — N2581 Secondary hyperparathyroidism of renal origin: Secondary | ICD-10-CM | POA: Diagnosis not present

## 2018-05-13 DIAGNOSIS — D631 Anemia in chronic kidney disease: Secondary | ICD-10-CM | POA: Diagnosis not present

## 2018-05-13 DIAGNOSIS — N186 End stage renal disease: Secondary | ICD-10-CM | POA: Diagnosis not present

## 2018-05-15 DIAGNOSIS — Z992 Dependence on renal dialysis: Secondary | ICD-10-CM | POA: Diagnosis not present

## 2018-05-15 DIAGNOSIS — N2581 Secondary hyperparathyroidism of renal origin: Secondary | ICD-10-CM | POA: Diagnosis not present

## 2018-05-15 DIAGNOSIS — T8610 Unspecified complication of kidney transplant: Secondary | ICD-10-CM | POA: Diagnosis not present

## 2018-05-15 DIAGNOSIS — N186 End stage renal disease: Secondary | ICD-10-CM | POA: Diagnosis not present

## 2018-05-15 DIAGNOSIS — D631 Anemia in chronic kidney disease: Secondary | ICD-10-CM | POA: Diagnosis not present

## 2018-05-20 DIAGNOSIS — N186 End stage renal disease: Secondary | ICD-10-CM | POA: Diagnosis not present

## 2018-05-20 DIAGNOSIS — N2581 Secondary hyperparathyroidism of renal origin: Secondary | ICD-10-CM | POA: Diagnosis not present

## 2018-05-24 DIAGNOSIS — N2581 Secondary hyperparathyroidism of renal origin: Secondary | ICD-10-CM | POA: Diagnosis not present

## 2018-05-24 DIAGNOSIS — N186 End stage renal disease: Secondary | ICD-10-CM | POA: Diagnosis not present

## 2018-05-27 DIAGNOSIS — N186 End stage renal disease: Secondary | ICD-10-CM | POA: Diagnosis not present

## 2018-05-27 DIAGNOSIS — N2581 Secondary hyperparathyroidism of renal origin: Secondary | ICD-10-CM | POA: Diagnosis not present

## 2018-05-31 DIAGNOSIS — N186 End stage renal disease: Secondary | ICD-10-CM | POA: Diagnosis not present

## 2018-05-31 DIAGNOSIS — N2581 Secondary hyperparathyroidism of renal origin: Secondary | ICD-10-CM | POA: Diagnosis not present

## 2018-06-03 DIAGNOSIS — N2581 Secondary hyperparathyroidism of renal origin: Secondary | ICD-10-CM | POA: Diagnosis not present

## 2018-06-03 DIAGNOSIS — N186 End stage renal disease: Secondary | ICD-10-CM | POA: Diagnosis not present

## 2018-06-05 DIAGNOSIS — N2581 Secondary hyperparathyroidism of renal origin: Secondary | ICD-10-CM | POA: Diagnosis not present

## 2018-06-05 DIAGNOSIS — N186 End stage renal disease: Secondary | ICD-10-CM | POA: Diagnosis not present

## 2018-06-07 DIAGNOSIS — N186 End stage renal disease: Secondary | ICD-10-CM | POA: Diagnosis not present

## 2018-06-07 DIAGNOSIS — N2581 Secondary hyperparathyroidism of renal origin: Secondary | ICD-10-CM | POA: Diagnosis not present

## 2018-06-10 DIAGNOSIS — N2581 Secondary hyperparathyroidism of renal origin: Secondary | ICD-10-CM | POA: Diagnosis not present

## 2018-06-10 DIAGNOSIS — N186 End stage renal disease: Secondary | ICD-10-CM | POA: Diagnosis not present

## 2018-06-14 DIAGNOSIS — N2581 Secondary hyperparathyroidism of renal origin: Secondary | ICD-10-CM | POA: Diagnosis not present

## 2018-06-14 DIAGNOSIS — N186 End stage renal disease: Secondary | ICD-10-CM | POA: Diagnosis not present

## 2018-06-15 DIAGNOSIS — Z992 Dependence on renal dialysis: Secondary | ICD-10-CM | POA: Diagnosis not present

## 2018-06-15 DIAGNOSIS — N186 End stage renal disease: Secondary | ICD-10-CM | POA: Diagnosis not present

## 2018-06-15 DIAGNOSIS — T8610 Unspecified complication of kidney transplant: Secondary | ICD-10-CM | POA: Diagnosis not present

## 2018-06-17 DIAGNOSIS — N186 End stage renal disease: Secondary | ICD-10-CM | POA: Diagnosis not present

## 2018-06-17 DIAGNOSIS — N2581 Secondary hyperparathyroidism of renal origin: Secondary | ICD-10-CM | POA: Diagnosis not present

## 2018-06-17 DIAGNOSIS — D631 Anemia in chronic kidney disease: Secondary | ICD-10-CM | POA: Diagnosis not present

## 2018-06-19 DIAGNOSIS — D631 Anemia in chronic kidney disease: Secondary | ICD-10-CM | POA: Diagnosis not present

## 2018-06-19 DIAGNOSIS — N186 End stage renal disease: Secondary | ICD-10-CM | POA: Diagnosis not present

## 2018-06-19 DIAGNOSIS — N2581 Secondary hyperparathyroidism of renal origin: Secondary | ICD-10-CM | POA: Diagnosis not present

## 2018-06-20 DIAGNOSIS — D631 Anemia in chronic kidney disease: Secondary | ICD-10-CM | POA: Diagnosis not present

## 2018-06-20 DIAGNOSIS — N186 End stage renal disease: Secondary | ICD-10-CM | POA: Diagnosis not present

## 2018-06-20 DIAGNOSIS — N2581 Secondary hyperparathyroidism of renal origin: Secondary | ICD-10-CM | POA: Diagnosis not present

## 2018-06-24 DIAGNOSIS — D631 Anemia in chronic kidney disease: Secondary | ICD-10-CM | POA: Diagnosis not present

## 2018-06-24 DIAGNOSIS — N186 End stage renal disease: Secondary | ICD-10-CM | POA: Diagnosis not present

## 2018-06-24 DIAGNOSIS — N2581 Secondary hyperparathyroidism of renal origin: Secondary | ICD-10-CM | POA: Diagnosis not present

## 2018-06-26 DIAGNOSIS — N186 End stage renal disease: Secondary | ICD-10-CM | POA: Diagnosis not present

## 2018-06-26 DIAGNOSIS — N2581 Secondary hyperparathyroidism of renal origin: Secondary | ICD-10-CM | POA: Diagnosis not present

## 2018-06-26 DIAGNOSIS — D631 Anemia in chronic kidney disease: Secondary | ICD-10-CM | POA: Diagnosis not present

## 2018-06-28 DIAGNOSIS — N2581 Secondary hyperparathyroidism of renal origin: Secondary | ICD-10-CM | POA: Diagnosis not present

## 2018-06-28 DIAGNOSIS — D631 Anemia in chronic kidney disease: Secondary | ICD-10-CM | POA: Diagnosis not present

## 2018-06-28 DIAGNOSIS — N186 End stage renal disease: Secondary | ICD-10-CM | POA: Diagnosis not present

## 2018-07-01 DIAGNOSIS — N186 End stage renal disease: Secondary | ICD-10-CM | POA: Diagnosis not present

## 2018-07-01 DIAGNOSIS — D631 Anemia in chronic kidney disease: Secondary | ICD-10-CM | POA: Diagnosis not present

## 2018-07-01 DIAGNOSIS — N2581 Secondary hyperparathyroidism of renal origin: Secondary | ICD-10-CM | POA: Diagnosis not present

## 2018-07-05 DIAGNOSIS — D631 Anemia in chronic kidney disease: Secondary | ICD-10-CM | POA: Diagnosis not present

## 2018-07-05 DIAGNOSIS — N186 End stage renal disease: Secondary | ICD-10-CM | POA: Diagnosis not present

## 2018-07-05 DIAGNOSIS — N2581 Secondary hyperparathyroidism of renal origin: Secondary | ICD-10-CM | POA: Diagnosis not present

## 2018-07-08 DIAGNOSIS — N2581 Secondary hyperparathyroidism of renal origin: Secondary | ICD-10-CM | POA: Diagnosis not present

## 2018-07-08 DIAGNOSIS — D631 Anemia in chronic kidney disease: Secondary | ICD-10-CM | POA: Diagnosis not present

## 2018-07-08 DIAGNOSIS — N186 End stage renal disease: Secondary | ICD-10-CM | POA: Diagnosis not present

## 2018-07-10 DIAGNOSIS — N186 End stage renal disease: Secondary | ICD-10-CM | POA: Diagnosis not present

## 2018-07-10 DIAGNOSIS — D631 Anemia in chronic kidney disease: Secondary | ICD-10-CM | POA: Diagnosis not present

## 2018-07-10 DIAGNOSIS — N2581 Secondary hyperparathyroidism of renal origin: Secondary | ICD-10-CM | POA: Diagnosis not present

## 2018-07-12 DIAGNOSIS — D631 Anemia in chronic kidney disease: Secondary | ICD-10-CM | POA: Diagnosis not present

## 2018-07-12 DIAGNOSIS — N186 End stage renal disease: Secondary | ICD-10-CM | POA: Diagnosis not present

## 2018-07-12 DIAGNOSIS — N2581 Secondary hyperparathyroidism of renal origin: Secondary | ICD-10-CM | POA: Diagnosis not present

## 2018-07-15 DIAGNOSIS — T8610 Unspecified complication of kidney transplant: Secondary | ICD-10-CM | POA: Diagnosis not present

## 2018-07-15 DIAGNOSIS — Z992 Dependence on renal dialysis: Secondary | ICD-10-CM | POA: Diagnosis not present

## 2018-07-15 DIAGNOSIS — N186 End stage renal disease: Secondary | ICD-10-CM | POA: Diagnosis not present

## 2018-07-17 DIAGNOSIS — Z23 Encounter for immunization: Secondary | ICD-10-CM | POA: Diagnosis not present

## 2018-07-17 DIAGNOSIS — N186 End stage renal disease: Secondary | ICD-10-CM | POA: Diagnosis not present

## 2018-07-17 DIAGNOSIS — N2581 Secondary hyperparathyroidism of renal origin: Secondary | ICD-10-CM | POA: Diagnosis not present

## 2018-07-17 DIAGNOSIS — D631 Anemia in chronic kidney disease: Secondary | ICD-10-CM | POA: Diagnosis not present

## 2018-07-19 DIAGNOSIS — N186 End stage renal disease: Secondary | ICD-10-CM | POA: Diagnosis not present

## 2018-07-19 DIAGNOSIS — N2581 Secondary hyperparathyroidism of renal origin: Secondary | ICD-10-CM | POA: Diagnosis not present

## 2018-07-19 DIAGNOSIS — Z23 Encounter for immunization: Secondary | ICD-10-CM | POA: Diagnosis not present

## 2018-07-19 DIAGNOSIS — D631 Anemia in chronic kidney disease: Secondary | ICD-10-CM | POA: Diagnosis not present

## 2018-07-22 DIAGNOSIS — Z23 Encounter for immunization: Secondary | ICD-10-CM | POA: Diagnosis not present

## 2018-07-22 DIAGNOSIS — N2581 Secondary hyperparathyroidism of renal origin: Secondary | ICD-10-CM | POA: Diagnosis not present

## 2018-07-22 DIAGNOSIS — D631 Anemia in chronic kidney disease: Secondary | ICD-10-CM | POA: Diagnosis not present

## 2018-07-22 DIAGNOSIS — N186 End stage renal disease: Secondary | ICD-10-CM | POA: Diagnosis not present

## 2018-07-24 DIAGNOSIS — D631 Anemia in chronic kidney disease: Secondary | ICD-10-CM | POA: Diagnosis not present

## 2018-07-24 DIAGNOSIS — N2581 Secondary hyperparathyroidism of renal origin: Secondary | ICD-10-CM | POA: Diagnosis not present

## 2018-07-24 DIAGNOSIS — N186 End stage renal disease: Secondary | ICD-10-CM | POA: Diagnosis not present

## 2018-07-24 DIAGNOSIS — Z23 Encounter for immunization: Secondary | ICD-10-CM | POA: Diagnosis not present

## 2018-07-26 ENCOUNTER — Telehealth (INDEPENDENT_AMBULATORY_CARE_PROVIDER_SITE_OTHER): Payer: Self-pay | Admitting: Vascular Surgery

## 2018-07-26 DIAGNOSIS — D631 Anemia in chronic kidney disease: Secondary | ICD-10-CM | POA: Diagnosis not present

## 2018-07-26 DIAGNOSIS — N2581 Secondary hyperparathyroidism of renal origin: Secondary | ICD-10-CM | POA: Diagnosis not present

## 2018-07-26 DIAGNOSIS — N186 End stage renal disease: Secondary | ICD-10-CM | POA: Diagnosis not present

## 2018-07-26 DIAGNOSIS — Z23 Encounter for immunization: Secondary | ICD-10-CM | POA: Diagnosis not present

## 2018-07-29 DIAGNOSIS — N186 End stage renal disease: Secondary | ICD-10-CM | POA: Diagnosis not present

## 2018-07-29 DIAGNOSIS — D631 Anemia in chronic kidney disease: Secondary | ICD-10-CM | POA: Diagnosis not present

## 2018-07-29 DIAGNOSIS — N2581 Secondary hyperparathyroidism of renal origin: Secondary | ICD-10-CM | POA: Diagnosis not present

## 2018-07-29 DIAGNOSIS — Z23 Encounter for immunization: Secondary | ICD-10-CM | POA: Diagnosis not present

## 2018-08-02 DIAGNOSIS — N2581 Secondary hyperparathyroidism of renal origin: Secondary | ICD-10-CM | POA: Diagnosis not present

## 2018-08-02 DIAGNOSIS — D631 Anemia in chronic kidney disease: Secondary | ICD-10-CM | POA: Diagnosis not present

## 2018-08-02 DIAGNOSIS — Z23 Encounter for immunization: Secondary | ICD-10-CM | POA: Diagnosis not present

## 2018-08-02 DIAGNOSIS — N186 End stage renal disease: Secondary | ICD-10-CM | POA: Diagnosis not present

## 2018-08-05 DIAGNOSIS — N2581 Secondary hyperparathyroidism of renal origin: Secondary | ICD-10-CM | POA: Diagnosis not present

## 2018-08-05 DIAGNOSIS — Z23 Encounter for immunization: Secondary | ICD-10-CM | POA: Diagnosis not present

## 2018-08-05 DIAGNOSIS — D631 Anemia in chronic kidney disease: Secondary | ICD-10-CM | POA: Diagnosis not present

## 2018-08-05 DIAGNOSIS — N186 End stage renal disease: Secondary | ICD-10-CM | POA: Diagnosis not present

## 2018-08-07 DIAGNOSIS — Z23 Encounter for immunization: Secondary | ICD-10-CM | POA: Diagnosis not present

## 2018-08-07 DIAGNOSIS — D631 Anemia in chronic kidney disease: Secondary | ICD-10-CM | POA: Diagnosis not present

## 2018-08-07 DIAGNOSIS — N186 End stage renal disease: Secondary | ICD-10-CM | POA: Diagnosis not present

## 2018-08-07 DIAGNOSIS — N2581 Secondary hyperparathyroidism of renal origin: Secondary | ICD-10-CM | POA: Diagnosis not present

## 2018-08-09 DIAGNOSIS — Z23 Encounter for immunization: Secondary | ICD-10-CM | POA: Diagnosis not present

## 2018-08-09 DIAGNOSIS — N2581 Secondary hyperparathyroidism of renal origin: Secondary | ICD-10-CM | POA: Diagnosis not present

## 2018-08-09 DIAGNOSIS — D631 Anemia in chronic kidney disease: Secondary | ICD-10-CM | POA: Diagnosis not present

## 2018-08-09 DIAGNOSIS — N186 End stage renal disease: Secondary | ICD-10-CM | POA: Diagnosis not present

## 2018-08-14 DIAGNOSIS — Z23 Encounter for immunization: Secondary | ICD-10-CM | POA: Diagnosis not present

## 2018-08-14 DIAGNOSIS — N2581 Secondary hyperparathyroidism of renal origin: Secondary | ICD-10-CM | POA: Diagnosis not present

## 2018-08-14 DIAGNOSIS — N186 End stage renal disease: Secondary | ICD-10-CM | POA: Diagnosis not present

## 2018-08-14 DIAGNOSIS — D631 Anemia in chronic kidney disease: Secondary | ICD-10-CM | POA: Diagnosis not present

## 2018-08-16 DIAGNOSIS — N2581 Secondary hyperparathyroidism of renal origin: Secondary | ICD-10-CM | POA: Diagnosis not present

## 2018-08-16 DIAGNOSIS — N186 End stage renal disease: Secondary | ICD-10-CM | POA: Diagnosis not present

## 2018-08-19 DIAGNOSIS — N186 End stage renal disease: Secondary | ICD-10-CM | POA: Diagnosis not present

## 2018-08-19 DIAGNOSIS — N2581 Secondary hyperparathyroidism of renal origin: Secondary | ICD-10-CM | POA: Diagnosis not present

## 2018-08-23 DIAGNOSIS — N186 End stage renal disease: Secondary | ICD-10-CM | POA: Diagnosis not present

## 2018-08-23 DIAGNOSIS — N2581 Secondary hyperparathyroidism of renal origin: Secondary | ICD-10-CM | POA: Diagnosis not present

## 2018-08-26 DIAGNOSIS — N2581 Secondary hyperparathyroidism of renal origin: Secondary | ICD-10-CM | POA: Diagnosis not present

## 2018-08-26 DIAGNOSIS — N186 End stage renal disease: Secondary | ICD-10-CM | POA: Diagnosis not present

## 2018-08-28 DIAGNOSIS — N2581 Secondary hyperparathyroidism of renal origin: Secondary | ICD-10-CM | POA: Diagnosis not present

## 2018-08-28 DIAGNOSIS — N186 End stage renal disease: Secondary | ICD-10-CM | POA: Diagnosis not present

## 2018-09-02 DIAGNOSIS — N186 End stage renal disease: Secondary | ICD-10-CM | POA: Diagnosis not present

## 2018-09-02 DIAGNOSIS — N2581 Secondary hyperparathyroidism of renal origin: Secondary | ICD-10-CM | POA: Diagnosis not present

## 2018-09-04 DIAGNOSIS — N186 End stage renal disease: Secondary | ICD-10-CM | POA: Diagnosis not present

## 2018-09-04 DIAGNOSIS — N2581 Secondary hyperparathyroidism of renal origin: Secondary | ICD-10-CM | POA: Diagnosis not present

## 2018-09-08 DIAGNOSIS — N186 End stage renal disease: Secondary | ICD-10-CM | POA: Diagnosis not present

## 2018-09-08 DIAGNOSIS — N2581 Secondary hyperparathyroidism of renal origin: Secondary | ICD-10-CM | POA: Diagnosis not present

## 2018-09-10 DIAGNOSIS — N186 End stage renal disease: Secondary | ICD-10-CM | POA: Diagnosis not present

## 2018-09-10 DIAGNOSIS — N2581 Secondary hyperparathyroidism of renal origin: Secondary | ICD-10-CM | POA: Diagnosis not present

## 2018-09-13 DIAGNOSIS — N2581 Secondary hyperparathyroidism of renal origin: Secondary | ICD-10-CM | POA: Diagnosis not present

## 2018-09-13 DIAGNOSIS — N186 End stage renal disease: Secondary | ICD-10-CM | POA: Diagnosis not present

## 2018-09-14 DIAGNOSIS — T8610 Unspecified complication of kidney transplant: Secondary | ICD-10-CM | POA: Diagnosis not present

## 2018-09-14 DIAGNOSIS — Z992 Dependence on renal dialysis: Secondary | ICD-10-CM | POA: Diagnosis not present

## 2018-09-14 DIAGNOSIS — N186 End stage renal disease: Secondary | ICD-10-CM | POA: Diagnosis not present

## 2018-09-16 DIAGNOSIS — N2581 Secondary hyperparathyroidism of renal origin: Secondary | ICD-10-CM | POA: Diagnosis not present

## 2018-09-16 DIAGNOSIS — N186 End stage renal disease: Secondary | ICD-10-CM | POA: Diagnosis not present

## 2018-09-18 DIAGNOSIS — N186 End stage renal disease: Secondary | ICD-10-CM | POA: Diagnosis not present

## 2018-09-18 DIAGNOSIS — N2581 Secondary hyperparathyroidism of renal origin: Secondary | ICD-10-CM | POA: Diagnosis not present

## 2018-09-23 DIAGNOSIS — N2581 Secondary hyperparathyroidism of renal origin: Secondary | ICD-10-CM | POA: Diagnosis not present

## 2018-09-23 DIAGNOSIS — N186 End stage renal disease: Secondary | ICD-10-CM | POA: Diagnosis not present

## 2018-09-25 DIAGNOSIS — N186 End stage renal disease: Secondary | ICD-10-CM | POA: Diagnosis not present

## 2018-09-25 DIAGNOSIS — N2581 Secondary hyperparathyroidism of renal origin: Secondary | ICD-10-CM | POA: Diagnosis not present

## 2018-09-27 DIAGNOSIS — N186 End stage renal disease: Secondary | ICD-10-CM | POA: Diagnosis not present

## 2018-09-27 DIAGNOSIS — N2581 Secondary hyperparathyroidism of renal origin: Secondary | ICD-10-CM | POA: Diagnosis not present

## 2018-09-30 DIAGNOSIS — N2581 Secondary hyperparathyroidism of renal origin: Secondary | ICD-10-CM | POA: Diagnosis not present

## 2018-09-30 DIAGNOSIS — N186 End stage renal disease: Secondary | ICD-10-CM | POA: Diagnosis not present

## 2018-10-02 DIAGNOSIS — N186 End stage renal disease: Secondary | ICD-10-CM | POA: Diagnosis not present

## 2018-10-02 DIAGNOSIS — N2581 Secondary hyperparathyroidism of renal origin: Secondary | ICD-10-CM | POA: Diagnosis not present

## 2018-10-06 DIAGNOSIS — N186 End stage renal disease: Secondary | ICD-10-CM | POA: Diagnosis not present

## 2018-10-06 DIAGNOSIS — N2581 Secondary hyperparathyroidism of renal origin: Secondary | ICD-10-CM | POA: Diagnosis not present

## 2018-10-08 DIAGNOSIS — N2581 Secondary hyperparathyroidism of renal origin: Secondary | ICD-10-CM | POA: Diagnosis not present

## 2018-10-08 DIAGNOSIS — N186 End stage renal disease: Secondary | ICD-10-CM | POA: Diagnosis not present

## 2018-10-11 DIAGNOSIS — N2581 Secondary hyperparathyroidism of renal origin: Secondary | ICD-10-CM | POA: Diagnosis not present

## 2018-10-11 DIAGNOSIS — N186 End stage renal disease: Secondary | ICD-10-CM | POA: Diagnosis not present

## 2018-10-13 DIAGNOSIS — N186 End stage renal disease: Secondary | ICD-10-CM | POA: Diagnosis not present

## 2018-10-13 DIAGNOSIS — N2581 Secondary hyperparathyroidism of renal origin: Secondary | ICD-10-CM | POA: Diagnosis not present

## 2018-10-15 DIAGNOSIS — N186 End stage renal disease: Secondary | ICD-10-CM | POA: Diagnosis not present

## 2018-10-15 DIAGNOSIS — Z992 Dependence on renal dialysis: Secondary | ICD-10-CM | POA: Diagnosis not present

## 2018-10-15 DIAGNOSIS — T8610 Unspecified complication of kidney transplant: Secondary | ICD-10-CM | POA: Diagnosis not present

## 2018-10-18 DIAGNOSIS — D631 Anemia in chronic kidney disease: Secondary | ICD-10-CM | POA: Diagnosis not present

## 2018-10-18 DIAGNOSIS — N2581 Secondary hyperparathyroidism of renal origin: Secondary | ICD-10-CM | POA: Diagnosis not present

## 2018-10-18 DIAGNOSIS — N186 End stage renal disease: Secondary | ICD-10-CM | POA: Diagnosis not present

## 2018-10-21 DIAGNOSIS — N186 End stage renal disease: Secondary | ICD-10-CM | POA: Diagnosis not present

## 2018-10-21 DIAGNOSIS — D631 Anemia in chronic kidney disease: Secondary | ICD-10-CM | POA: Diagnosis not present

## 2018-10-21 DIAGNOSIS — N2581 Secondary hyperparathyroidism of renal origin: Secondary | ICD-10-CM | POA: Diagnosis not present

## 2018-10-23 DIAGNOSIS — D631 Anemia in chronic kidney disease: Secondary | ICD-10-CM | POA: Diagnosis not present

## 2018-10-23 DIAGNOSIS — N2581 Secondary hyperparathyroidism of renal origin: Secondary | ICD-10-CM | POA: Diagnosis not present

## 2018-10-23 DIAGNOSIS — N186 End stage renal disease: Secondary | ICD-10-CM | POA: Diagnosis not present

## 2018-10-25 DIAGNOSIS — N186 End stage renal disease: Secondary | ICD-10-CM | POA: Diagnosis not present

## 2018-10-25 DIAGNOSIS — D631 Anemia in chronic kidney disease: Secondary | ICD-10-CM | POA: Diagnosis not present

## 2018-10-25 DIAGNOSIS — N2581 Secondary hyperparathyroidism of renal origin: Secondary | ICD-10-CM | POA: Diagnosis not present

## 2018-10-28 DIAGNOSIS — N2581 Secondary hyperparathyroidism of renal origin: Secondary | ICD-10-CM | POA: Diagnosis not present

## 2018-10-28 DIAGNOSIS — N186 End stage renal disease: Secondary | ICD-10-CM | POA: Diagnosis not present

## 2018-10-28 DIAGNOSIS — D631 Anemia in chronic kidney disease: Secondary | ICD-10-CM | POA: Diagnosis not present

## 2018-10-28 IMAGING — CT CT CHEST W/O CM
2 of 4 series · 15 of 36 positions shown, 18 images · non-contrast
Comparison: Radiographs 03/12/2018 and 03/01/2018. Abdominal CT
04/09/2016.

CLINICAL DATA: Fever with cough, nausea, vomiting and malaise for 1
week. End-stage renal disease on hemodialysis.

EXAM:
CT CHEST WITHOUT CONTRAST
TECHNIQUE: Multidetector CT imaging of the chest was performed following the
standard protocol without IV contrast.

[Series 2: thorax · axial · 0.68mm/px · z∈[-595,-261]mm · 12 of 195 slices shown, 15 images]
[im 14/195  mediastinal]
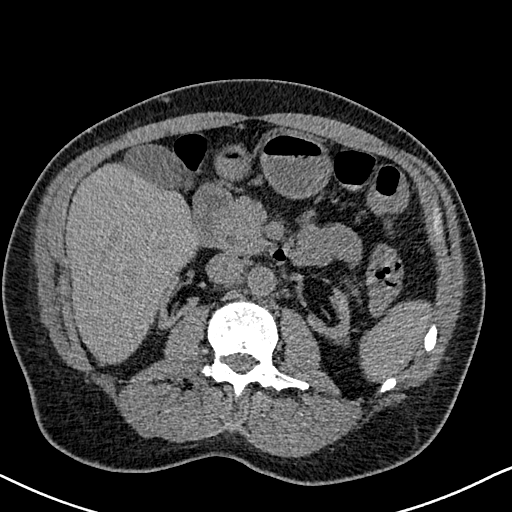
[im 14/195  lung]
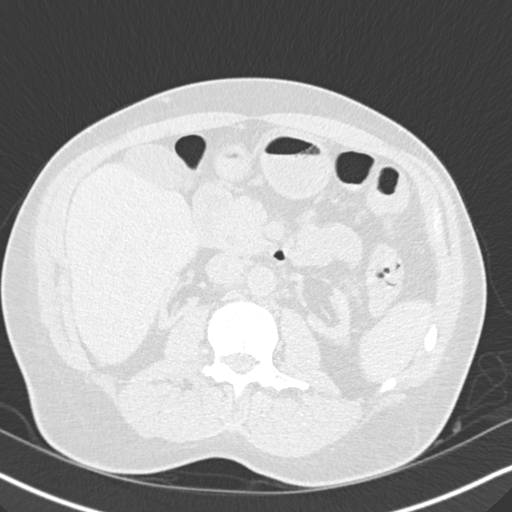
[im 28/195  lung]
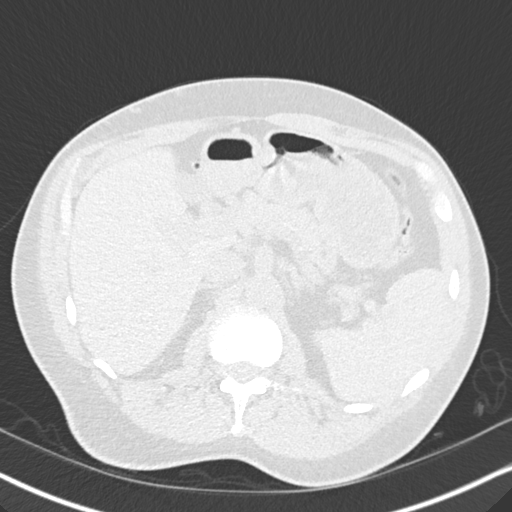
[im 42/195  lung]
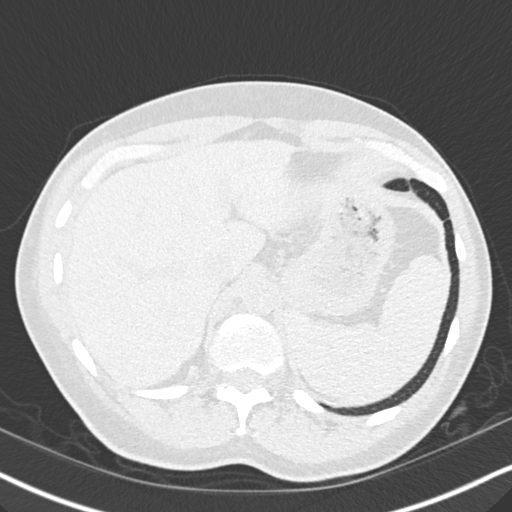
[im 56/195  lung]
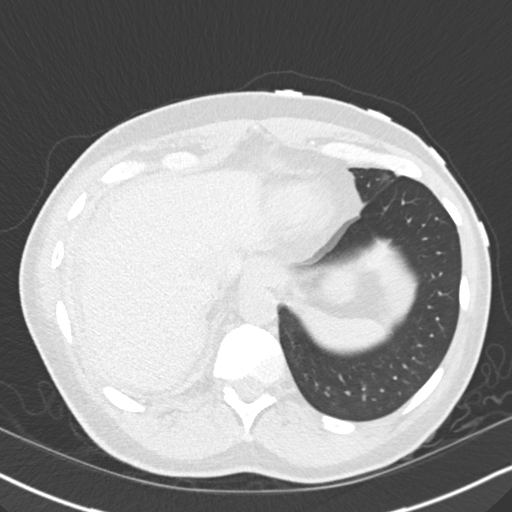
[im 70/195  mediastinal]
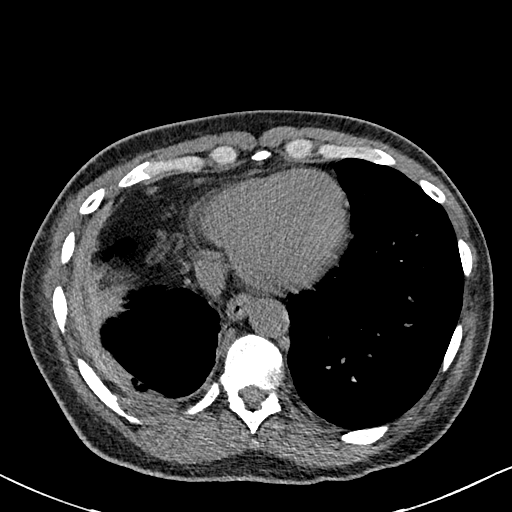
[im 70/195  lung]
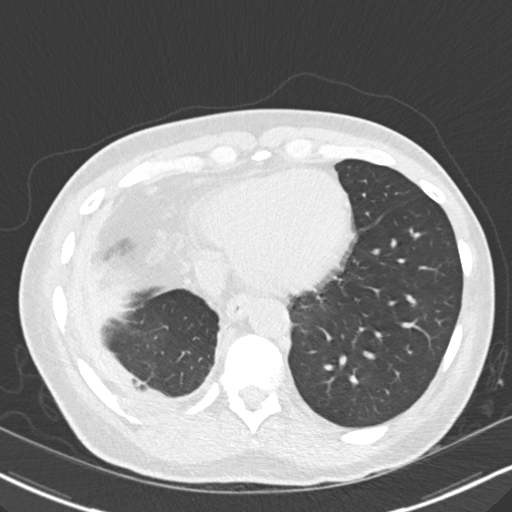
[im 84/195  lung]
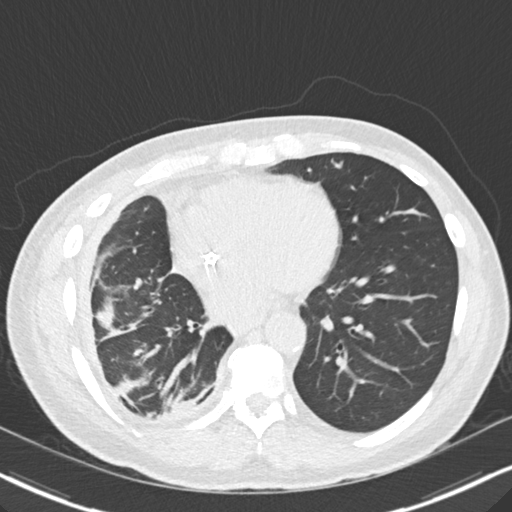
[im 111/195  lung]
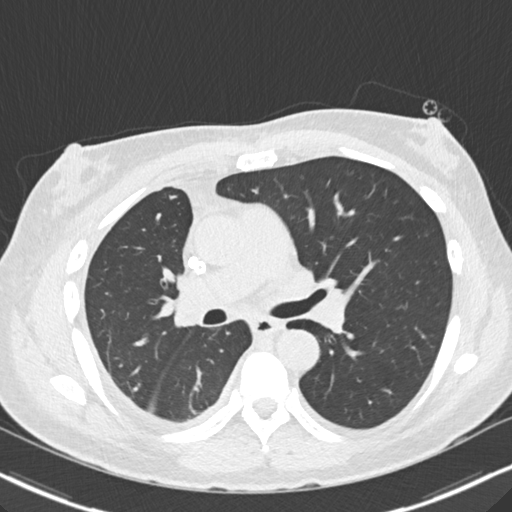
[im 125/195  lung]
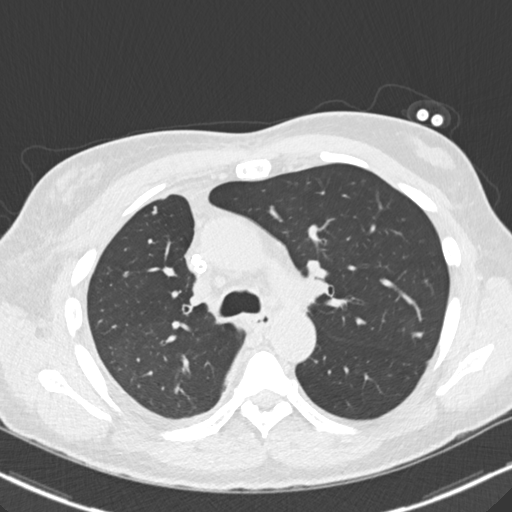
[im 139/195  mediastinal]
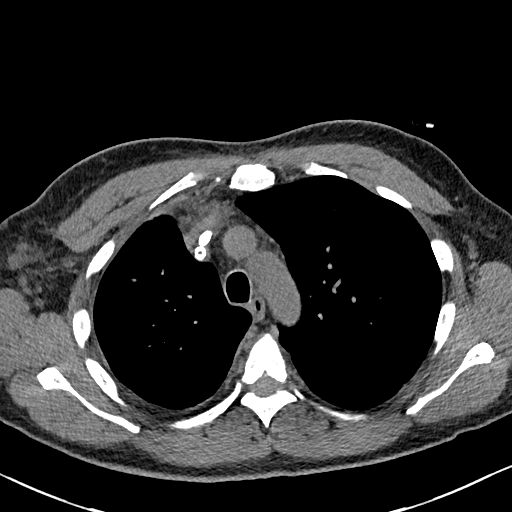
[im 139/195  lung]
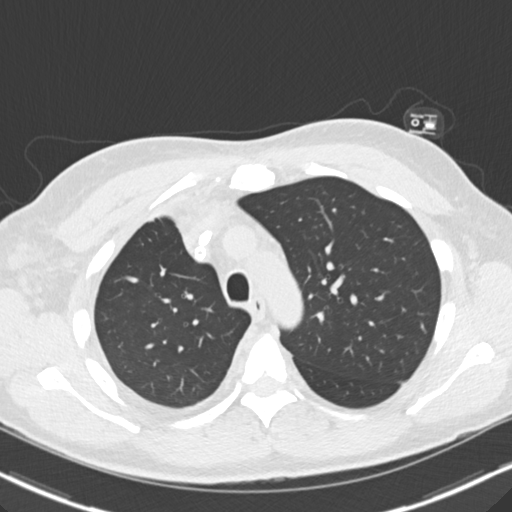
[im 153/195  lung]
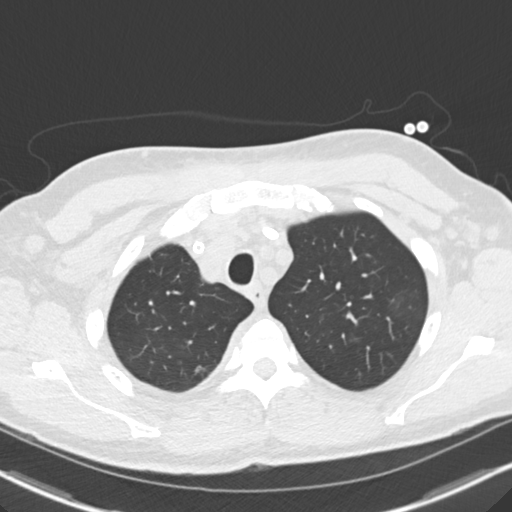
[im 167/195  lung]
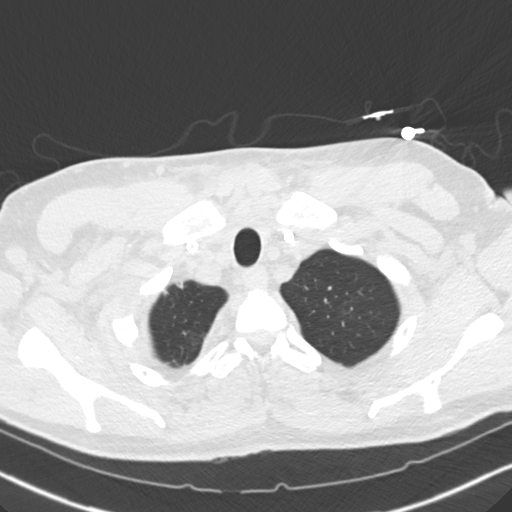
[im 181/195  lung]
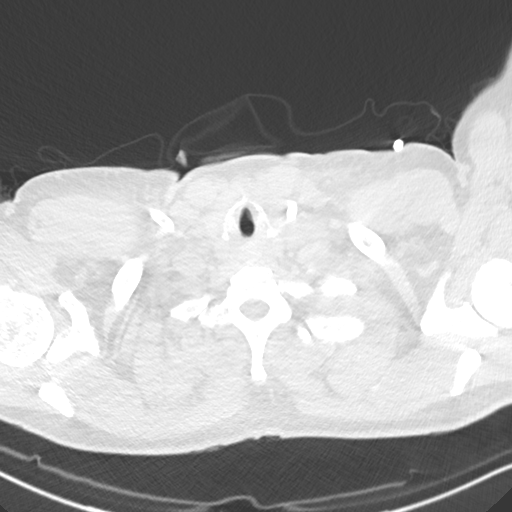

[Series 5: coronal · coronal · 0.65mm/px · 3 of 124 slices shown]
[im 25/124  lung]
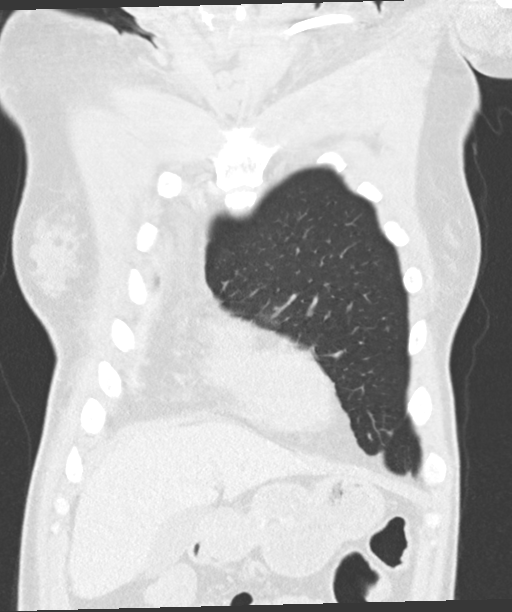
[im 50/124  lung]
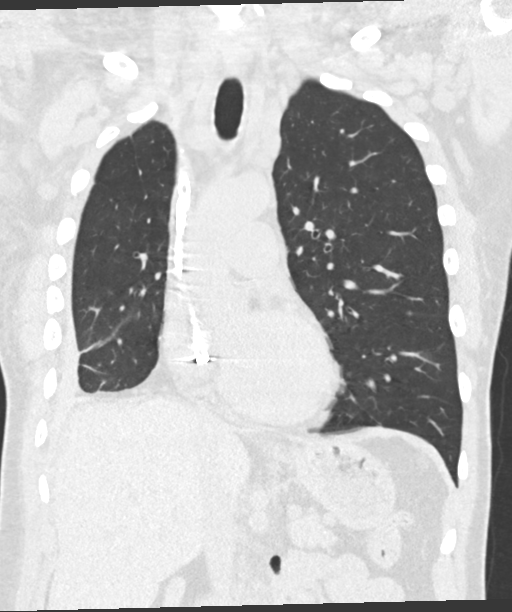
[im 74/124  lung]
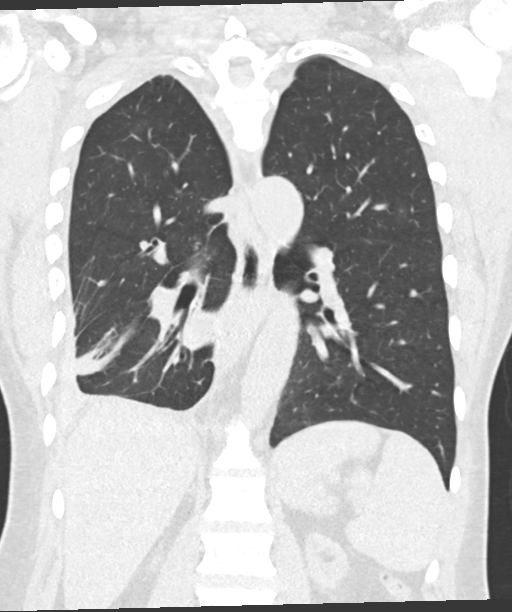

[15 of 36 positions shown; findings below may reference images not displayed]

FINDINGS: Cardiovascular: Bilateral IJ hemodialysis catheters are unchanged
from the recent chest radiographs. No acute vascular findings on
noncontrast imaging. There is coronary artery and mild aortic
atherosclerosis. The heart size is normal. There is no pericardial
effusion.

Mediastinum/Nodes: There are no enlarged mediastinal or hilar lymph
nodes. There are mildly prominent axillary lymph nodes bilaterally,
measuring up to 13 mm on the right (image 55/2). The thyroid gland,
trachea and esophagus demonstrate no significant findings.

Lungs/Pleura: Chronic pleural thickening on the right and blunting
of the right costophrenic angle are similar to recent prior chest
radiographs. There is no significant residual pleural effusion.
Subpleural opacities and volume loss in the right middle and lower
lobes are adjacent to the pleural disease and most likely reflect
chronic rounded atelectasis. Minimal patchy left upper lobe
ground-glass opacities noted on images 46 and 47/3, likely
inflammatory. There is no consolidation. 5 mm perifissural nodule
along the left major fissure on image 79/3 is likely a lymph node.

Upper abdomen: Marked chronic renal cortical thinning and small
cystic renal lesions bilaterally are similar to the prior study. No
acute abdominal findings.

Musculoskeletal/Chest wall: There is no chest wall mass or
suspicious osseous finding. Moderate bilateral gynecomastia.
IMPRESSION: 1. Right pleural effusion demonstrated on 6303 abdominal CT has
resolved. There is underlying pleural thickening, blunting of the
costophrenic angle and subpleural densities in the right middle and
lower lobes, likely representing chronic atelectasis as correlated
with interval chest radiographs.
2. Patchy left upper lobe ground-glass opacity may be inflammatory.
No consolidation.
3. Mildly prominent axillary lymph nodes bilaterally, likely
reactive.
4. Sequela of chronic renal failure.
5. Coronary artery atherosclerosis.

## 2018-11-01 DIAGNOSIS — D631 Anemia in chronic kidney disease: Secondary | ICD-10-CM | POA: Diagnosis not present

## 2018-11-01 DIAGNOSIS — N2581 Secondary hyperparathyroidism of renal origin: Secondary | ICD-10-CM | POA: Diagnosis not present

## 2018-11-01 DIAGNOSIS — N186 End stage renal disease: Secondary | ICD-10-CM | POA: Diagnosis not present

## 2018-11-04 DIAGNOSIS — N186 End stage renal disease: Secondary | ICD-10-CM | POA: Diagnosis not present

## 2018-11-04 DIAGNOSIS — D631 Anemia in chronic kidney disease: Secondary | ICD-10-CM | POA: Diagnosis not present

## 2018-11-04 DIAGNOSIS — N2581 Secondary hyperparathyroidism of renal origin: Secondary | ICD-10-CM | POA: Diagnosis not present

## 2018-11-04 IMAGING — DX DG CHEST 1V
1 series · 1 of 1 positions shown · non-contrast
Comparison: CT 03/13/2018.  Radiographs 03/12/2018.

CLINICAL DATA: Assess catheter placement.

EXAM:
CHEST  1 VIEW

[chest ap]
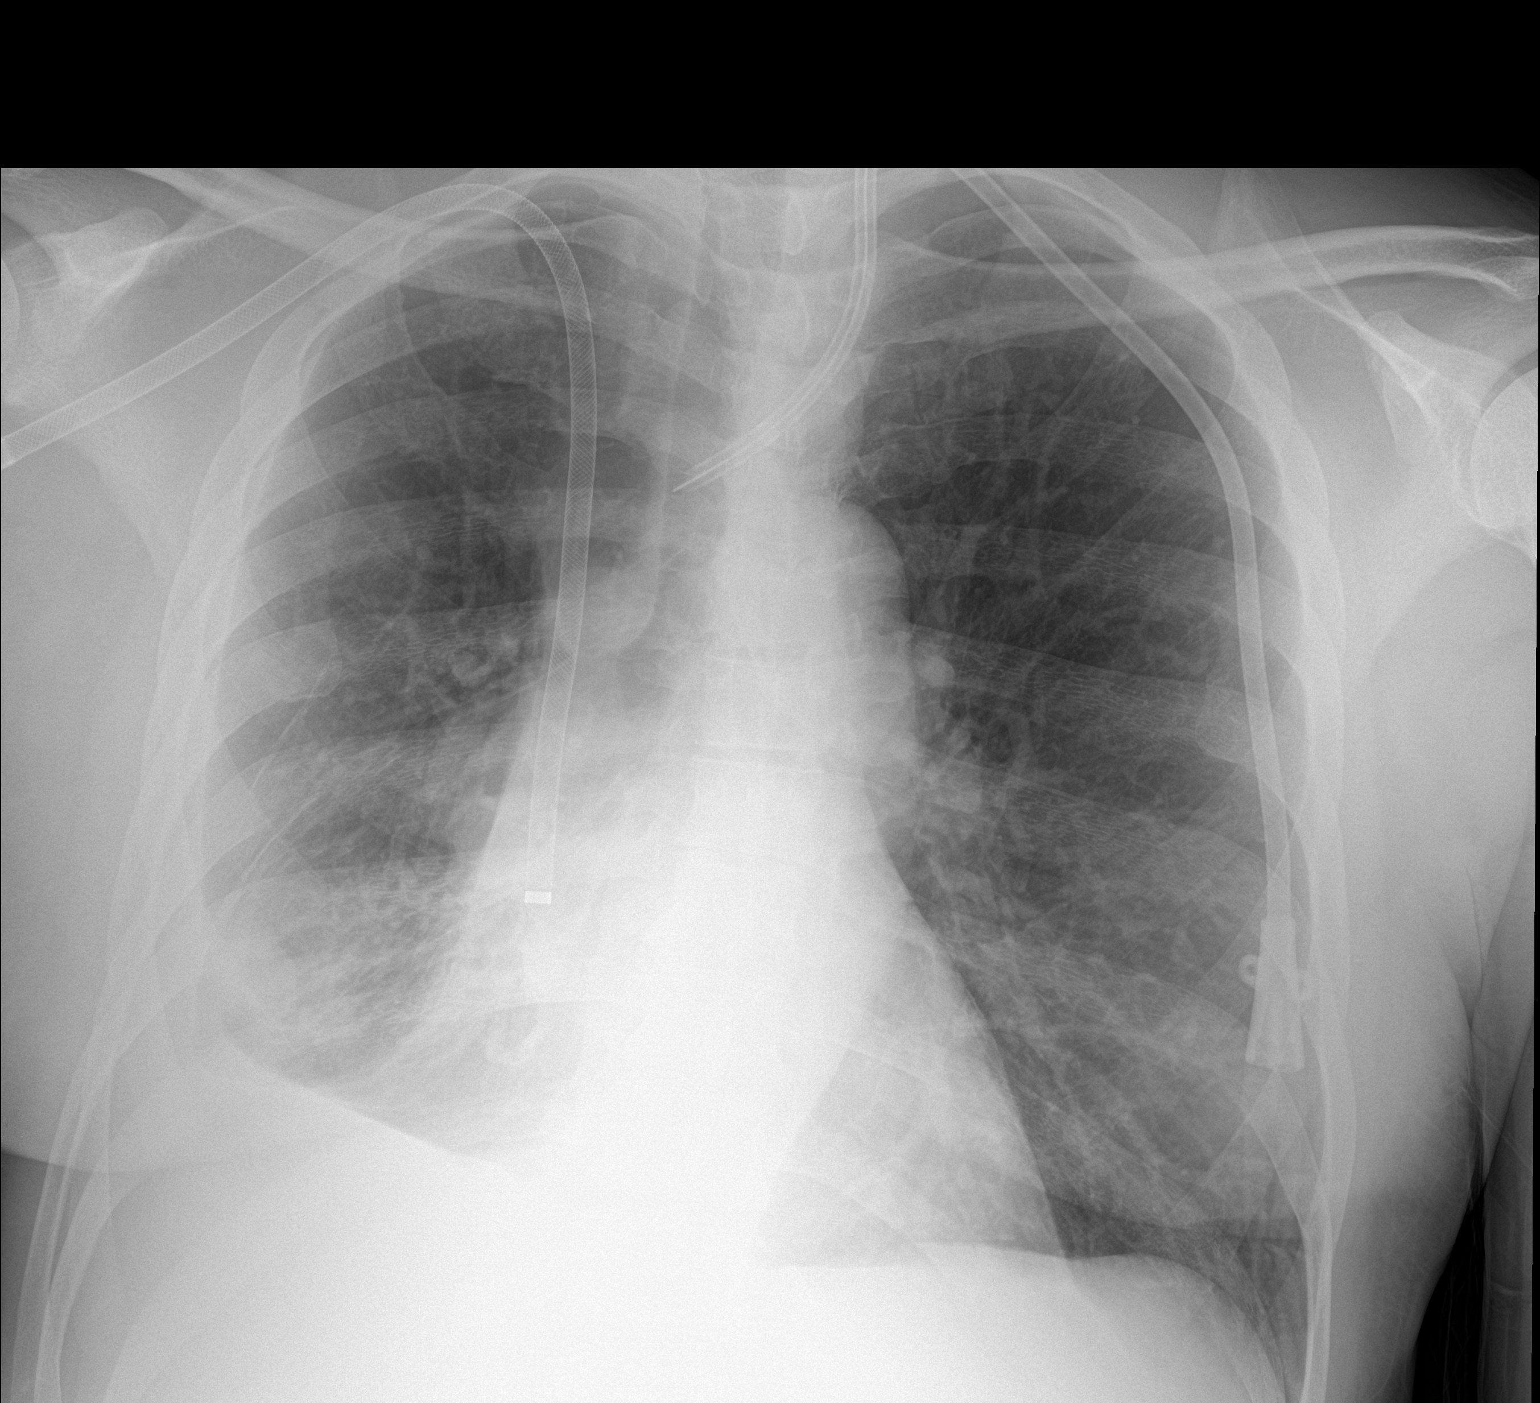

[1 of 1 positions shown; findings below may reference images not displayed]

FINDINGS: 0321 hour. Interval new left IJ central venous catheter terminates
at the confluence of the brachiocephalic veins. Right IJ dialysis
graft is unchanged at the level of the upper right atrium. The heart
size and mediastinal contours are stable. There is stable pleural
thickening on the right and right basilar scarring. The left lung is
clear. There is no pneumothorax.
IMPRESSION: Interval exchange of the left internal jugular catheter, new
catheter terminating near the confluence of the brachiocephalic
veins. Right IJ dialysis catheter and pleuroparenchymal scarring on
the right are unchanged. No pneumothorax.

## 2018-11-06 DIAGNOSIS — N2581 Secondary hyperparathyroidism of renal origin: Secondary | ICD-10-CM | POA: Diagnosis not present

## 2018-11-06 DIAGNOSIS — D631 Anemia in chronic kidney disease: Secondary | ICD-10-CM | POA: Diagnosis not present

## 2018-11-06 DIAGNOSIS — N186 End stage renal disease: Secondary | ICD-10-CM | POA: Diagnosis not present

## 2018-11-08 DIAGNOSIS — N2581 Secondary hyperparathyroidism of renal origin: Secondary | ICD-10-CM | POA: Diagnosis not present

## 2018-11-08 DIAGNOSIS — D631 Anemia in chronic kidney disease: Secondary | ICD-10-CM | POA: Diagnosis not present

## 2018-11-08 DIAGNOSIS — N186 End stage renal disease: Secondary | ICD-10-CM | POA: Diagnosis not present

## 2018-11-11 DIAGNOSIS — N186 End stage renal disease: Secondary | ICD-10-CM | POA: Diagnosis not present

## 2018-11-11 DIAGNOSIS — D631 Anemia in chronic kidney disease: Secondary | ICD-10-CM | POA: Diagnosis not present

## 2018-11-11 DIAGNOSIS — N2581 Secondary hyperparathyroidism of renal origin: Secondary | ICD-10-CM | POA: Diagnosis not present

## 2018-11-13 DIAGNOSIS — N2581 Secondary hyperparathyroidism of renal origin: Secondary | ICD-10-CM | POA: Diagnosis not present

## 2018-11-13 DIAGNOSIS — D631 Anemia in chronic kidney disease: Secondary | ICD-10-CM | POA: Diagnosis not present

## 2018-11-13 DIAGNOSIS — N186 End stage renal disease: Secondary | ICD-10-CM | POA: Diagnosis not present

## 2018-11-14 ENCOUNTER — Telehealth (INDEPENDENT_AMBULATORY_CARE_PROVIDER_SITE_OTHER): Payer: Self-pay | Admitting: Vascular Surgery

## 2018-11-14 NOTE — Telephone Encounter (Signed)
Please advise on below  

## 2018-11-15 DIAGNOSIS — D631 Anemia in chronic kidney disease: Secondary | ICD-10-CM | POA: Diagnosis not present

## 2018-11-15 DIAGNOSIS — N186 End stage renal disease: Secondary | ICD-10-CM | POA: Diagnosis not present

## 2018-11-15 DIAGNOSIS — T8610 Unspecified complication of kidney transplant: Secondary | ICD-10-CM | POA: Diagnosis not present

## 2018-11-15 DIAGNOSIS — Z992 Dependence on renal dialysis: Secondary | ICD-10-CM | POA: Diagnosis not present

## 2018-11-15 DIAGNOSIS — N2581 Secondary hyperparathyroidism of renal origin: Secondary | ICD-10-CM | POA: Diagnosis not present

## 2018-11-18 DIAGNOSIS — D631 Anemia in chronic kidney disease: Secondary | ICD-10-CM | POA: Diagnosis not present

## 2018-11-18 DIAGNOSIS — Z23 Encounter for immunization: Secondary | ICD-10-CM | POA: Diagnosis not present

## 2018-11-18 DIAGNOSIS — N2581 Secondary hyperparathyroidism of renal origin: Secondary | ICD-10-CM | POA: Diagnosis not present

## 2018-11-18 DIAGNOSIS — N186 End stage renal disease: Secondary | ICD-10-CM | POA: Diagnosis not present

## 2018-11-18 DIAGNOSIS — D509 Iron deficiency anemia, unspecified: Secondary | ICD-10-CM | POA: Diagnosis not present

## 2018-11-20 ENCOUNTER — Ambulatory Visit (INDEPENDENT_AMBULATORY_CARE_PROVIDER_SITE_OTHER): Payer: Medicare Other

## 2018-11-20 ENCOUNTER — Other Ambulatory Visit (INDEPENDENT_AMBULATORY_CARE_PROVIDER_SITE_OTHER): Payer: Self-pay | Admitting: Vascular Surgery

## 2018-11-20 ENCOUNTER — Ambulatory Visit (INDEPENDENT_AMBULATORY_CARE_PROVIDER_SITE_OTHER): Payer: Medicare Other | Admitting: Nurse Practitioner

## 2018-11-20 ENCOUNTER — Encounter (INDEPENDENT_AMBULATORY_CARE_PROVIDER_SITE_OTHER): Payer: Self-pay | Admitting: Nurse Practitioner

## 2018-11-20 VITALS — BP 143/90 | HR 89 | Resp 16 | Ht 70.0 in | Wt 178.0 lb

## 2018-11-20 DIAGNOSIS — N186 End stage renal disease: Secondary | ICD-10-CM

## 2018-11-20 DIAGNOSIS — I120 Hypertensive chronic kidney disease with stage 5 chronic kidney disease or end stage renal disease: Secondary | ICD-10-CM

## 2018-11-20 DIAGNOSIS — Z23 Encounter for immunization: Secondary | ICD-10-CM | POA: Diagnosis not present

## 2018-11-20 DIAGNOSIS — E785 Hyperlipidemia, unspecified: Secondary | ICD-10-CM

## 2018-11-20 DIAGNOSIS — D509 Iron deficiency anemia, unspecified: Secondary | ICD-10-CM | POA: Diagnosis not present

## 2018-11-20 DIAGNOSIS — D631 Anemia in chronic kidney disease: Secondary | ICD-10-CM | POA: Diagnosis not present

## 2018-11-20 DIAGNOSIS — I10 Essential (primary) hypertension: Secondary | ICD-10-CM

## 2018-11-20 DIAGNOSIS — N2581 Secondary hyperparathyroidism of renal origin: Secondary | ICD-10-CM | POA: Diagnosis not present

## 2018-11-20 DIAGNOSIS — Z992 Dependence on renal dialysis: Secondary | ICD-10-CM

## 2018-11-21 ENCOUNTER — Encounter (INDEPENDENT_AMBULATORY_CARE_PROVIDER_SITE_OTHER): Payer: Self-pay

## 2018-11-21 ENCOUNTER — Telehealth (INDEPENDENT_AMBULATORY_CARE_PROVIDER_SITE_OTHER): Payer: Self-pay

## 2018-11-21 NOTE — Telephone Encounter (Signed)
Attempted to contact the patent and had to leave a message for a return call. I have faxed his procedure information to his dialysis center at [facility] attn: [place].

## 2018-11-22 DIAGNOSIS — D631 Anemia in chronic kidney disease: Secondary | ICD-10-CM | POA: Diagnosis not present

## 2018-11-22 DIAGNOSIS — N2581 Secondary hyperparathyroidism of renal origin: Secondary | ICD-10-CM | POA: Diagnosis not present

## 2018-11-22 DIAGNOSIS — D509 Iron deficiency anemia, unspecified: Secondary | ICD-10-CM | POA: Diagnosis not present

## 2018-11-22 DIAGNOSIS — Z23 Encounter for immunization: Secondary | ICD-10-CM | POA: Diagnosis not present

## 2018-11-22 DIAGNOSIS — N186 End stage renal disease: Secondary | ICD-10-CM | POA: Diagnosis not present

## 2018-11-24 IMAGING — CR DG CHEST 2V
1 series · 2 of 2 positions shown · non-contrast
Comparison: Chest x-ray dated March 20, 2018.

CLINICAL DATA: Worsening shortness of breath.

EXAM:
CHEST - 2 VIEW

[Series 1: dg chest 2 view · 0.14mm/px · 2 of 2 slices shown]
[im 1/2]
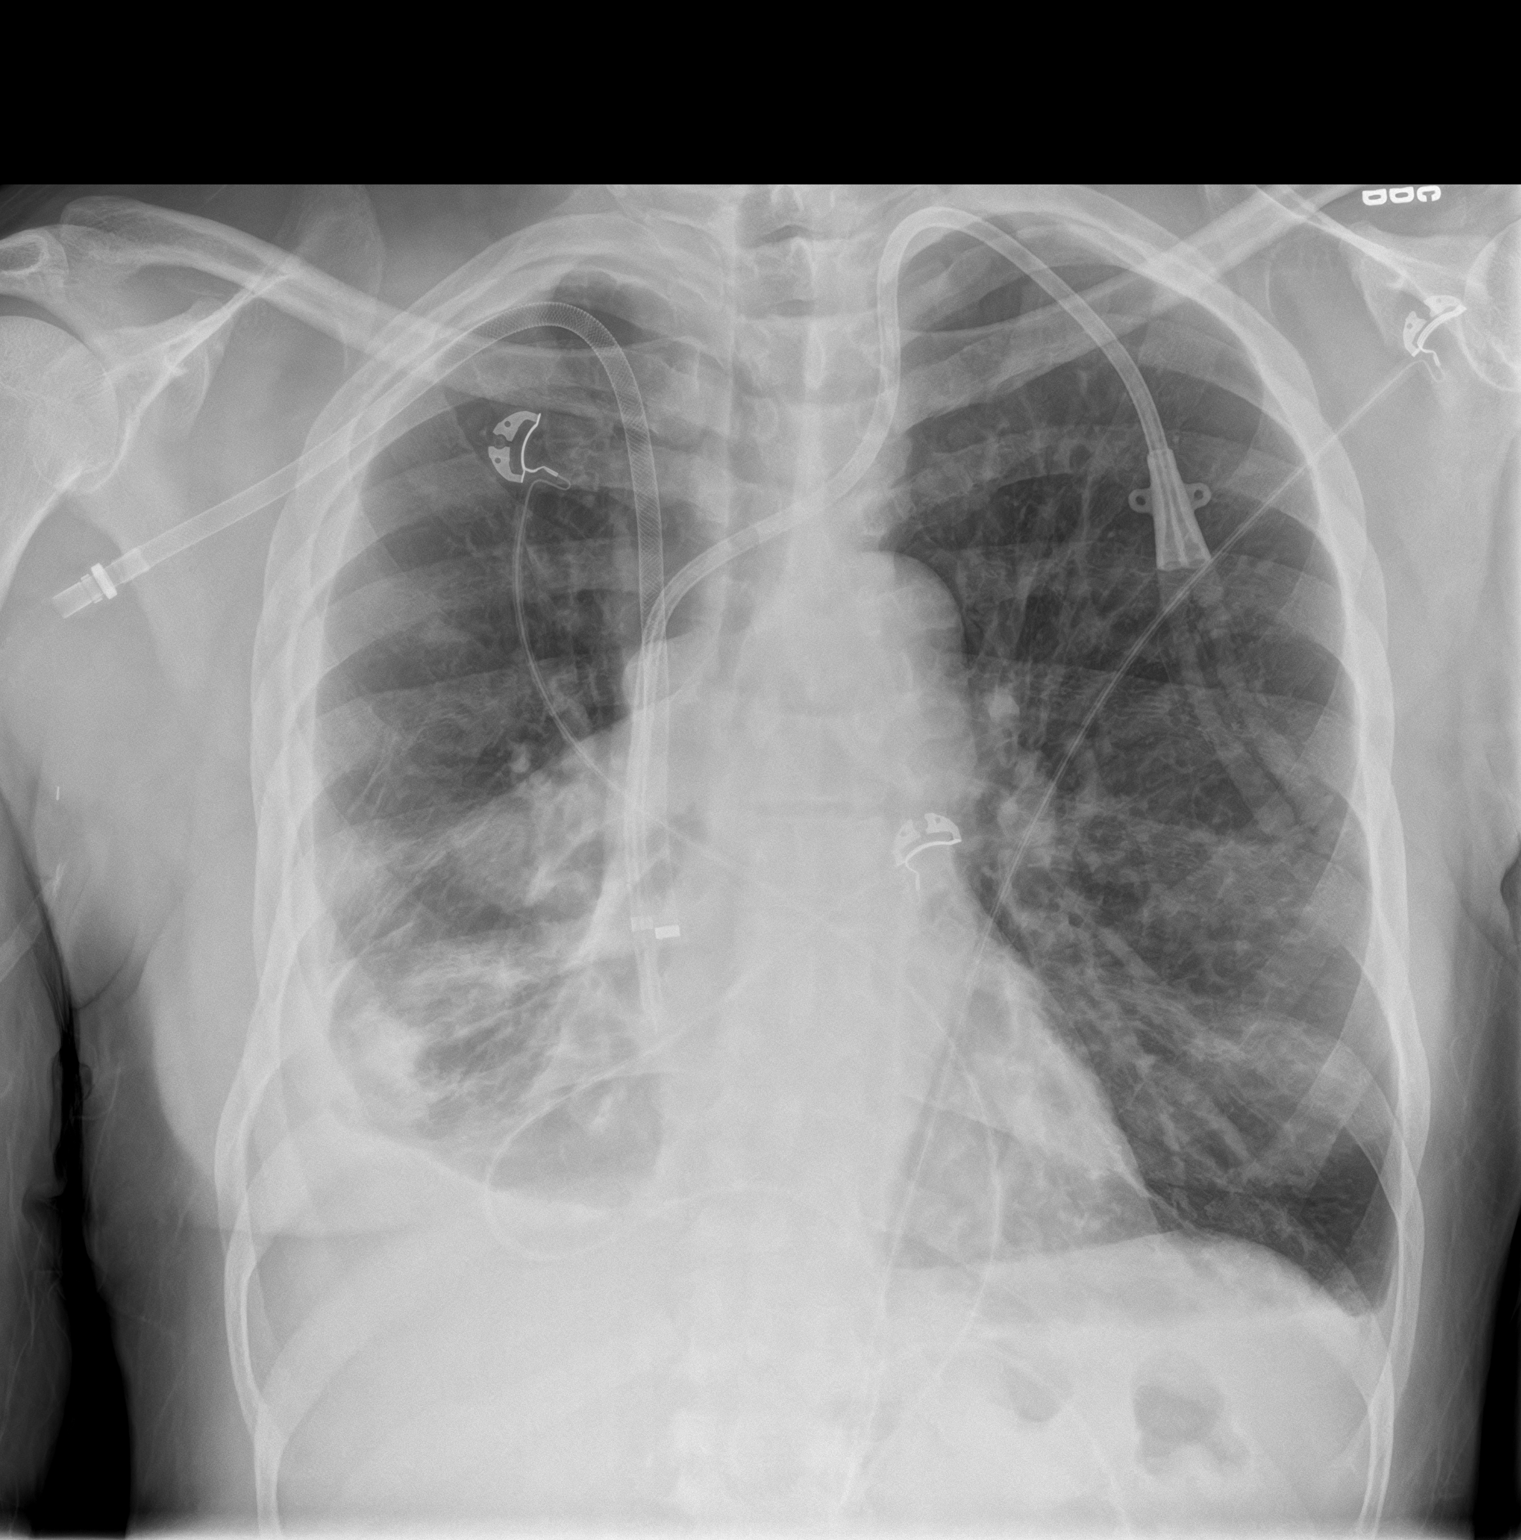
[im 2/2]
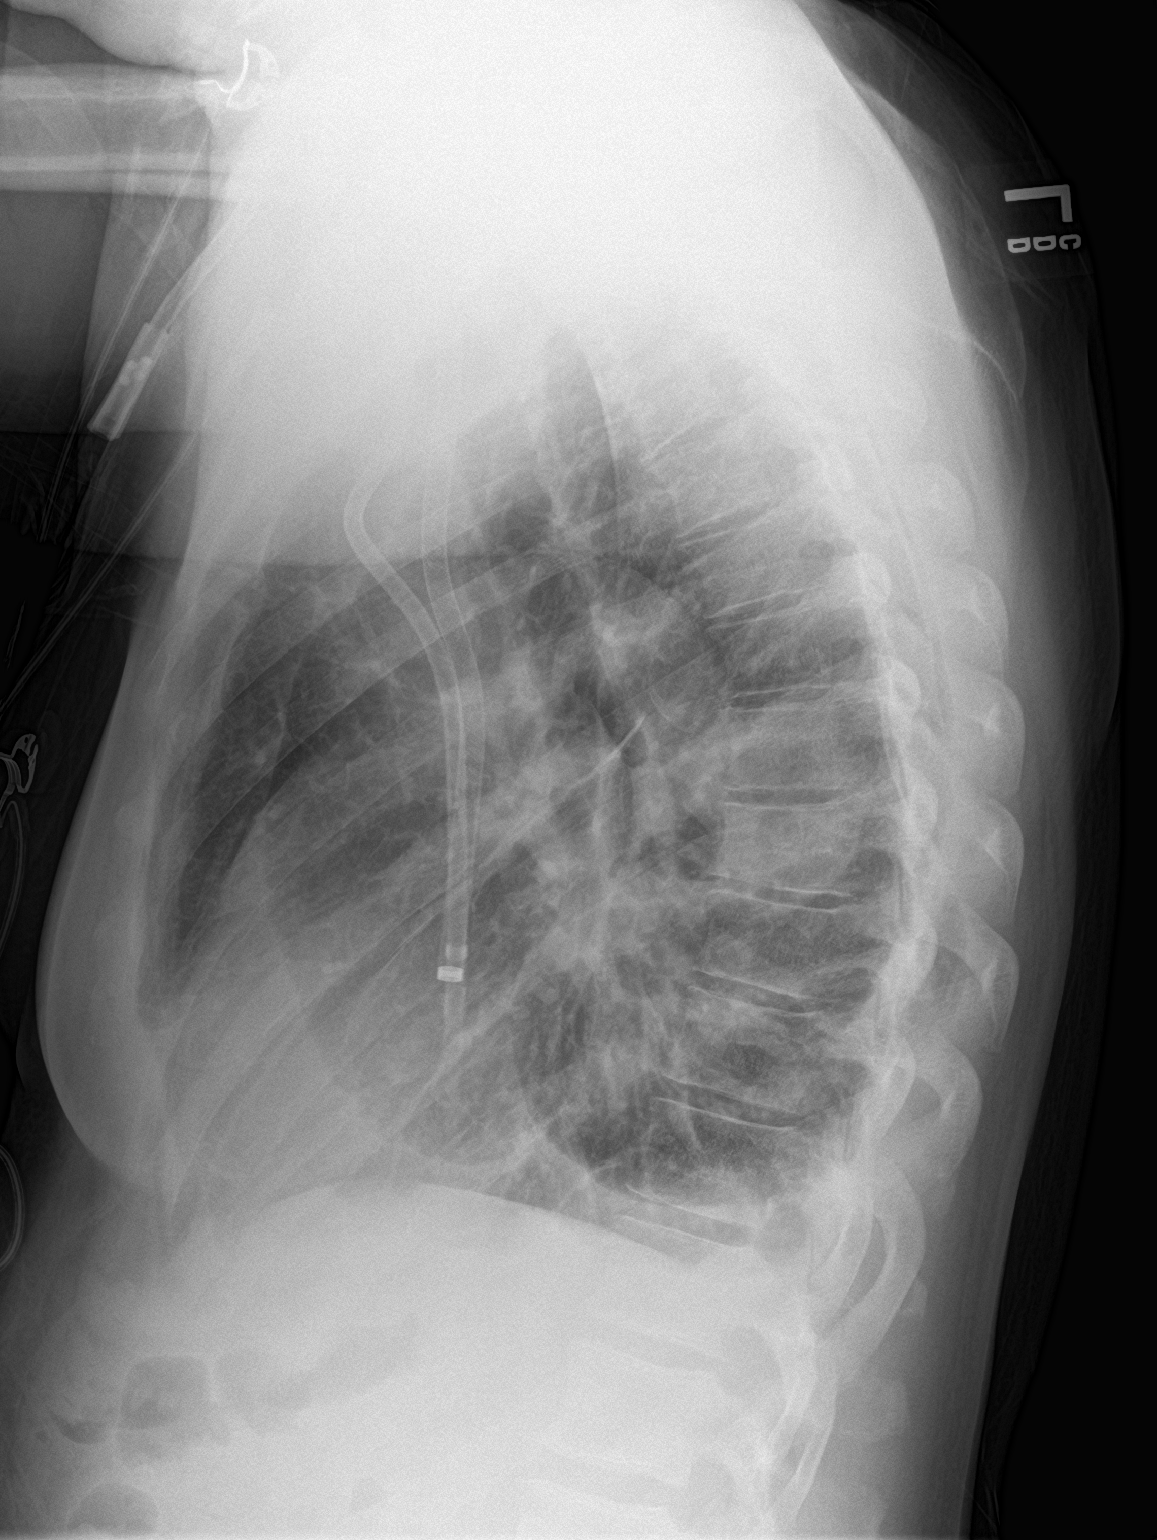

[2 of 2 positions shown; findings below may reference images not displayed]

FINDINGS: Unchanged tunneled left internal jugular dialysis catheter with the
tip in the proximal right atrium. Unchanged HERO graft in the right
upper extremity terminating at the cavoatrial junction. The heart
size and mediastinal contours are within normal limits. New mild
pulmonary vascular congestion. Unchanged right pleural thickening
and basilar scarring. Trace left pleural effusion. No pneumothorax.
No acute osseous abnormality.
IMPRESSION: 1. New mild pulmonary vascular congestion and trace left pleural
effusion.
2. Unchanged right pleuroparenchymal scarring.

## 2018-11-25 ENCOUNTER — Encounter (INDEPENDENT_AMBULATORY_CARE_PROVIDER_SITE_OTHER): Payer: Self-pay | Admitting: Nurse Practitioner

## 2018-11-25 DIAGNOSIS — N2581 Secondary hyperparathyroidism of renal origin: Secondary | ICD-10-CM | POA: Diagnosis not present

## 2018-11-25 DIAGNOSIS — N186 End stage renal disease: Secondary | ICD-10-CM | POA: Diagnosis not present

## 2018-11-25 DIAGNOSIS — D631 Anemia in chronic kidney disease: Secondary | ICD-10-CM | POA: Diagnosis not present

## 2018-11-25 DIAGNOSIS — Z23 Encounter for immunization: Secondary | ICD-10-CM | POA: Diagnosis not present

## 2018-11-25 DIAGNOSIS — D509 Iron deficiency anemia, unspecified: Secondary | ICD-10-CM | POA: Diagnosis not present

## 2018-11-25 IMAGING — US US EXTREM  UP VENOUS*R*
1 series · 13 of 24 positions shown · non-contrast
Comparison: None.

CLINICAL DATA: Right arm swelling for 1 day



[Series 1: us extrem up venous*right* · 13 of 58 slices shown]
[im 1/58]
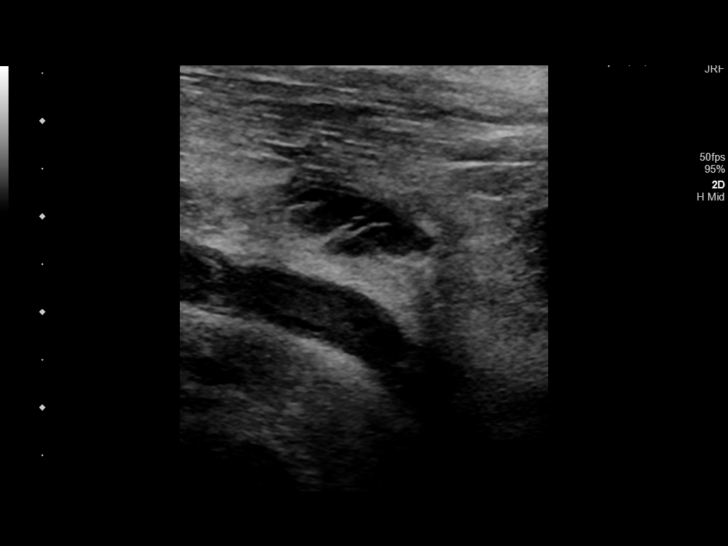
[im 5/58]
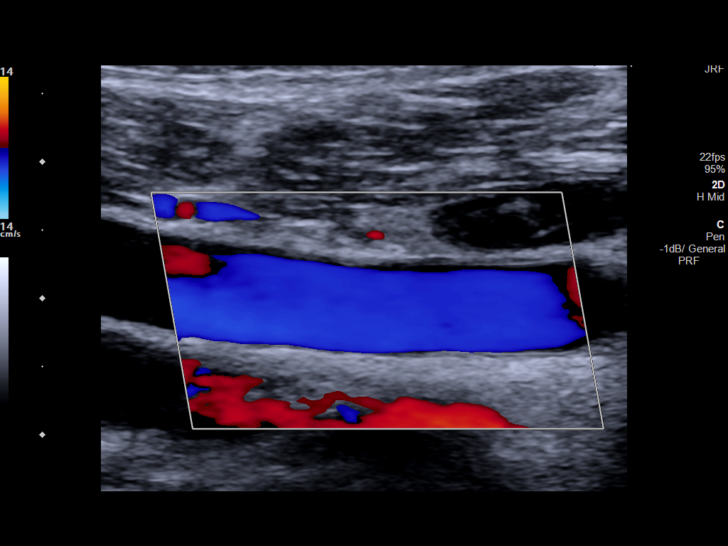
[im 10/58]
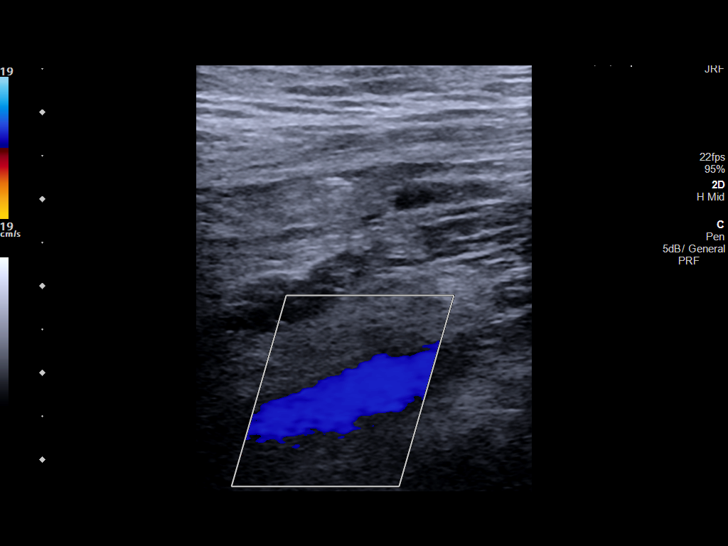
[im 15/58]
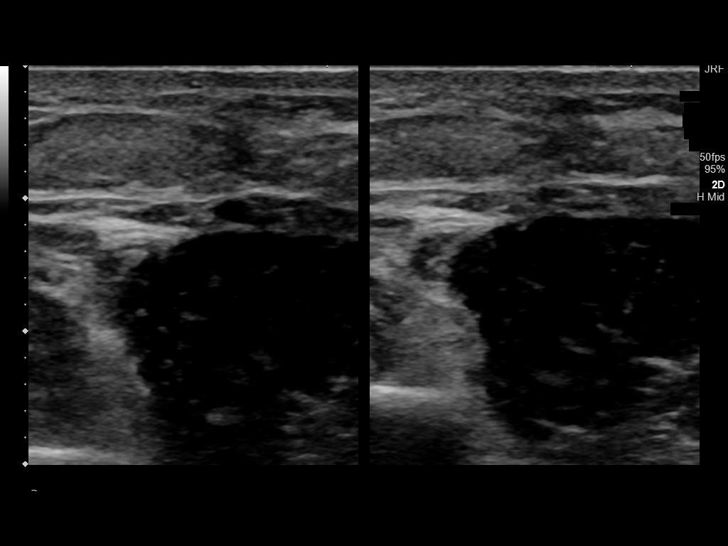
[im 20/58]
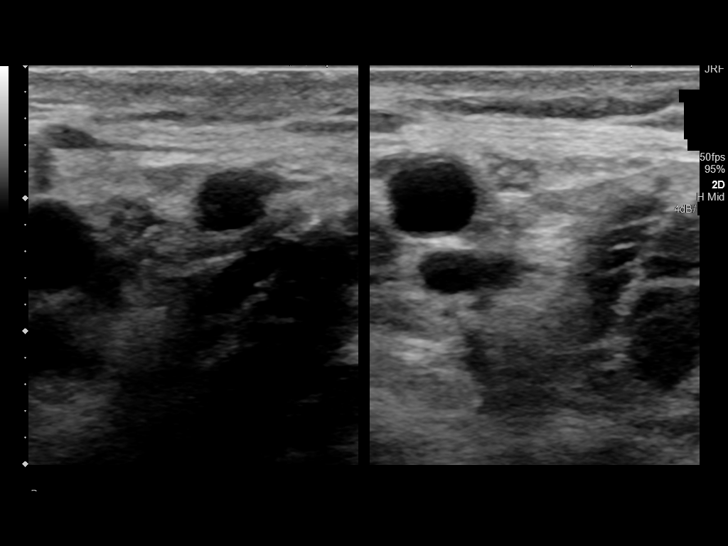
[im 25/58]
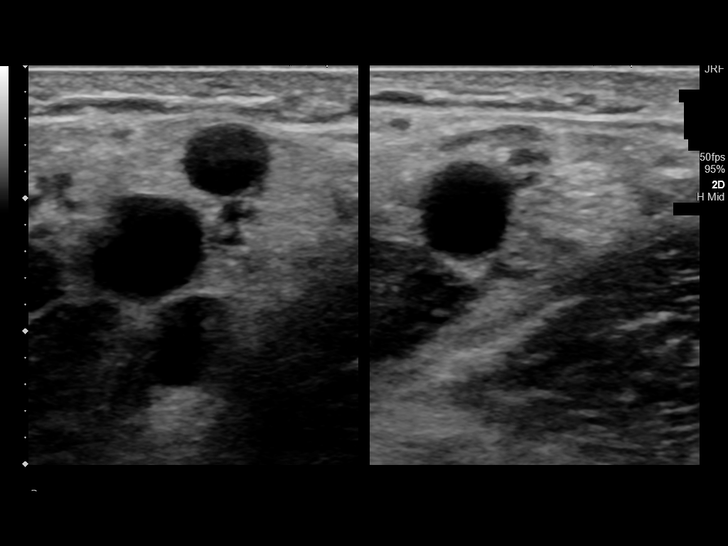
[im 30/58]
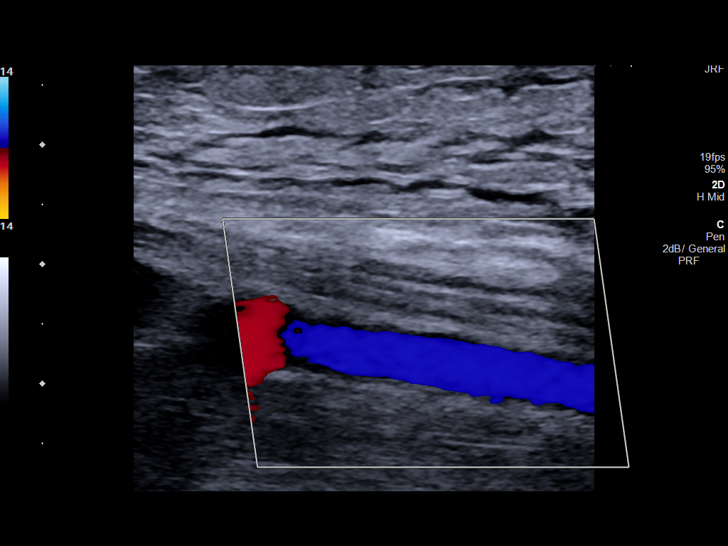
[im 33/58]
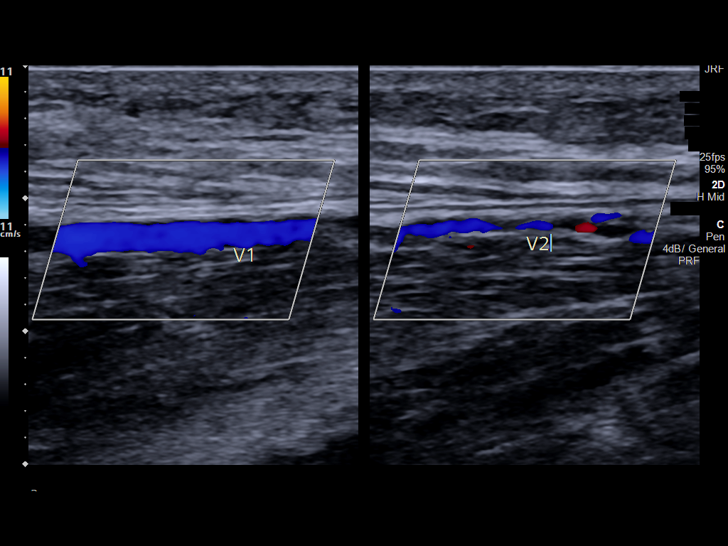
[im 38/58]
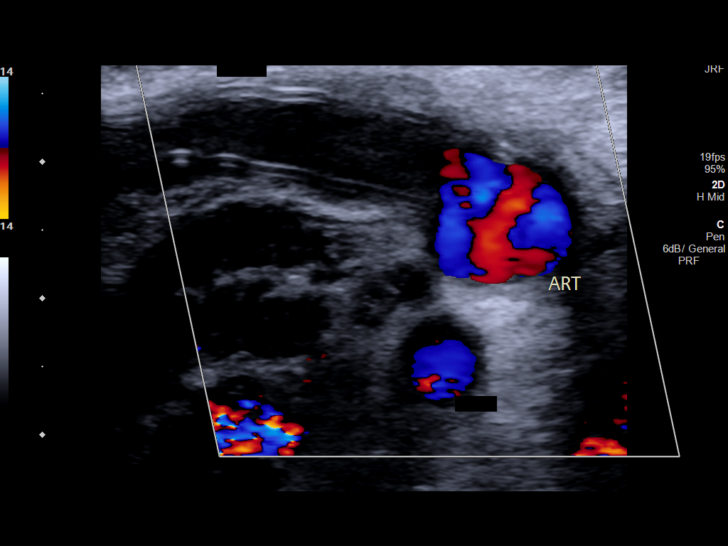
[im 43/58]
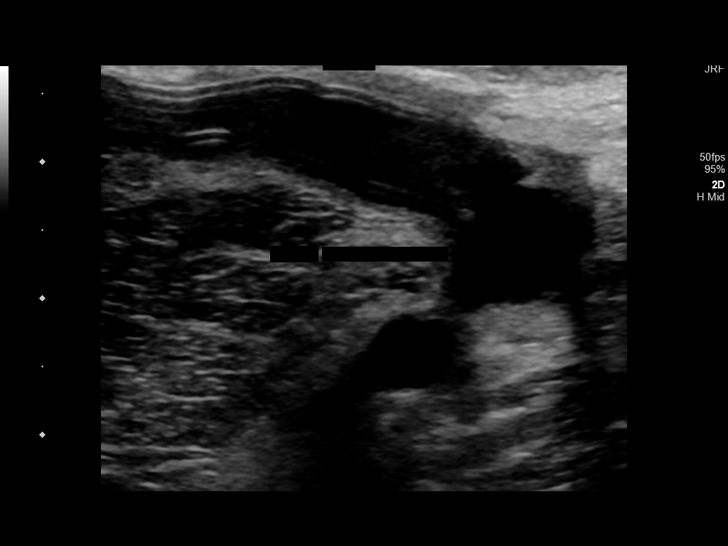
[im 48/58]
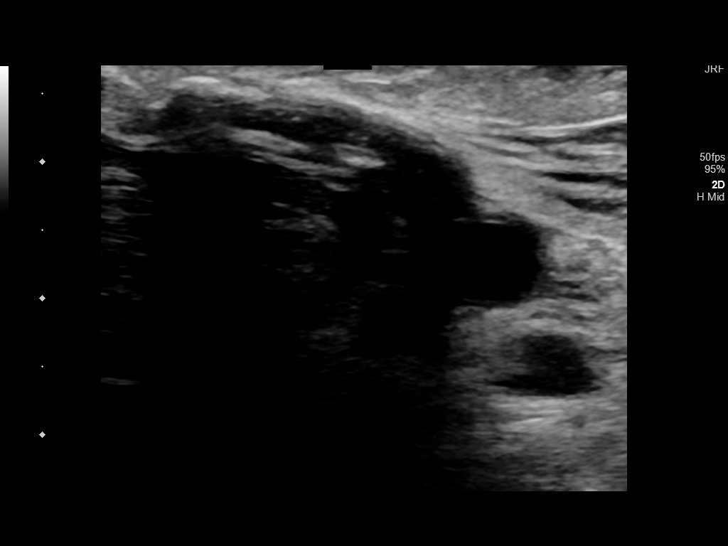
[im 53/58]
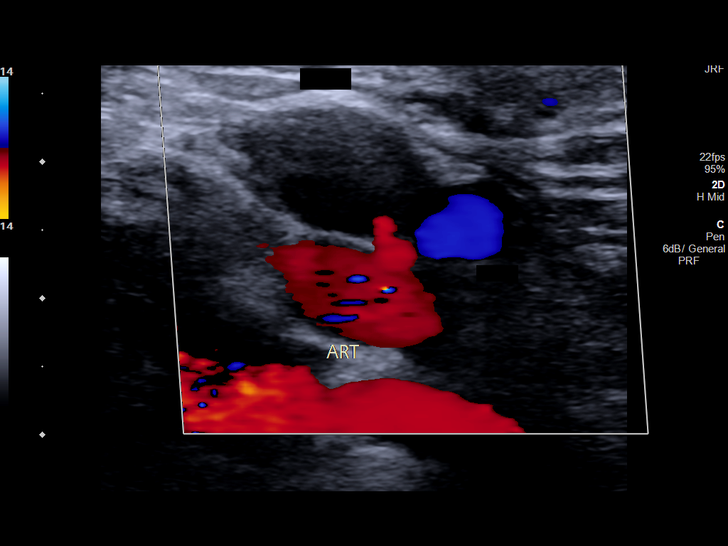
[im 58/58]
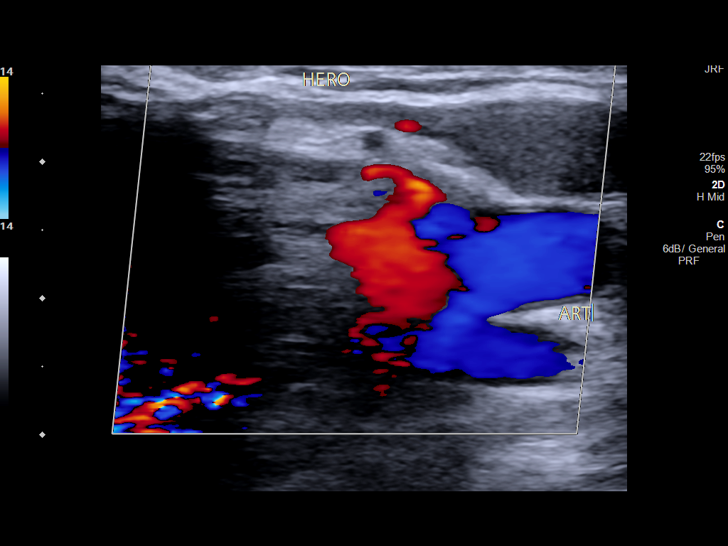

[13 of 24 positions shown; findings below may reference images not displayed]

FINDINGS: Contralateral Subclavian Vein: Hypoechoic nonocclusive thrombus in
the left subclavian vein. Phasic flow preserved. This may be chronic
given the history of extensive hemodialysis access sites and
interventions.

Internal Jugular Vein: No evidence of thrombus. Normal
compressibility, respiratory phasicity and response to augmentation.

Subclavian Vein: No evidence of thrombus. Normal compressibility,
respiratory phasicity and response to augmentation.

Axillary Vein: No evidence of thrombus. Normal compressibility,
respiratory phasicity and response to augmentation.

Cephalic Vein: No evidence of thrombus. Normal compressibility,
respiratory phasicity and response to augmentation.

Basilic Vein: No evidence of thrombus. Normal compressibility,
respiratory phasicity and response to augmentation.

Brachial Veins: No evidence of thrombus. Normal compressibility,
respiratory phasicity and response to augmentation.

Radial Veins: No evidence of thrombus. Normal compressibility,
respiratory phasicity and response to augmentation.

Ulnar Veins: No evidence of thrombus. Normal compressibility,
respiratory phasicity and response to augmentation.

Venous Reflux:  None visualized.

Other Findings: Right upper arm Tavron dialysis access is
occluded/thrombosed.
IMPRESSION: No evidence of significant DVT within the right upper extremity.

Right upper arm Tavron dialysis access is thrombosed

LEFT subclavian nonocclusive thrombus noted, this may be chronic
given the extensive hemodialysis access sites and interventions.

## 2018-11-25 NOTE — Progress Notes (Signed)
Subjective:    Patient ID: Jonathon Conley, male    DOB: [birth year], 53 y.o.   MRN: [id number] Chief Complaint  Patient presents with  . Follow-up    HPI  Jonathon Conley is a 53 y.o. male that presents today for evaluation of his hemodialysis access.  The patient underwent a revision of his right hero graft on 04/25/2018.  He has not been seen since this intervention.  He stated that following the intervention his arm became very large and swollen and that it was wrapped with Coban and.  He states that after this wrapping with Coban and he did subsequently developed a swollen raised area on his arm.  He denies any pain to the area but that it came up shortly after his surgical intervention and has been there ever since.  He denies any fever, chills, nausea, vomiting.  He denies any chest pain or shortness of breath.  He denies any TIA-like symptoms.  He is maintained via PermCath.  He has not yet utilize the revised hero graft. Past Medical History:  Diagnosis Date  . Anemia   . Asthma    childhood  . Blood transfusion without reported diagnosis   . CHF (congestive heart failure) ([place])   . Chronic kidney disease    Dialysis Mon, Wed, Fri  . Colitis   . COPD (chronic obstructive pulmonary disease) ([place])   . Diverticulosis 04/12/16   also seen: sigmoid and rectal erythema, path:   Marland Kitchen Dyspnea   . Heart murmur   . Hemorrhoids 03/2016   bleeding at 04/12/16 colonoscopy, treated with  APC laser. cauterization  . Hyperlipidemia   . Hypertension   . OSA (obstructive sleep apnea) 03/27/2013    Past Surgical History:  Procedure Laterality Date  . APPLICATION OF WOUND VAC Right 12/13/2016   Procedure: APPLICATION OF WOUND VAC;  Surgeon: Algernon Huxley, MD;  Location: [facility];  Service: Vascular;  Laterality: Right;  . APPLICATION OF WOUND VAC Right 12/19/2016   Procedure: APPLICATION OF WOUND VAC;  Surgeon: Algernon Huxley, MD;  Location: [facility];  Service: Vascular;  Laterality: Right;  .  COLONOSCOPY N/A 04/12/2016   Procedure: COLONOSCOPY;  Surgeon: Mauri Pole, MD;  Location: [facility];  Service: Endoscopy;  Laterality: N/A;  . DIALYSIS/PERMA CATHETER INSERTION N/A 02/26/2017   Procedure: Dialysis/Perma Catheter Insertion;  Surgeon: Algernon Huxley, MD;  Location: [place] [facility];  Service: Cardiovascular;  Laterality: N/A;  . DIALYSIS/PERMA CATHETER INSERTION N/A 03/26/2018   Procedure: DIALYSIS/PERMA CATHETER INSERTION;  Surgeon: Katha Cabal, MD;  Location: [place] [facility];  Service: Cardiovascular;  Laterality: N/A;  . EXCHANGE OF A DIALYSIS CATHETER  11/20/2016   Procedure: Exchange Of A Dialysis Catheter;  Surgeon: Algernon Huxley, MD;  Location: [place] [facility];  Service: Cardiovascular;;  . fistulagrams Right   . HEMATOMA EVACUATION Right 12/13/2016   Procedure: EVACUATION HEMATOMA;  Surgeon: Algernon Huxley, MD;  Location: [facility];  Service: Vascular;  Laterality: Right;  . HEMATOMA EVACUATION Right 12/19/2016   Procedure: EVACUATIONof seroma;  Surgeon: Algernon Huxley, MD;  Location: [facility];  Service: Vascular;  Laterality: Right;  . HOT HEMOSTASIS N/A 04/12/2016   Procedure: HOT HEMOSTASIS (ARGON PLASMA COAGULATION/BICAP);  Surgeon: Mauri Pole, MD;  Location: [facility];  Service: Endoscopy;  Laterality: N/A;  . KIDNEY TRANSPLANT    . LIGATIONS OF HERO GRAFT Right 04/25/2018   Procedure: LIGATIONS OF HERO GRAFT ( REVISION );  Surgeon: Algernon Huxley, MD;  Location: [facility];  Service: Vascular;  Laterality: Right;  . PERIPHERAL VASCULAR CATHETERIZATION Bilateral 10/02/2016   Procedure: Upper Extremity Venography;  Surgeon: Algernon Huxley, MD;  Location: [place] [facility];  Service: Cardiovascular;  Laterality: Bilateral;  . PERIPHERAL VASCULAR CATHETERIZATION Right 11/08/2016   Procedure: DIALYSIS/PERMA CATHETER REMOVAL right Jugular;  Surgeon: Algernon Huxley, MD;  Location: [facility];  Service: Vascular;  Laterality: Right;  . PERIPHERAL VASCULAR  CATHETERIZATION Left 11/08/2016   Procedure: DIALYSIS/PERMA CATHETER INSERTION left jugular with u/s guide and flouroscan;  Surgeon: Algernon Huxley, MD;  Location: [facility];  Service: Vascular;  Laterality: Left;  . PERIPHERAL VASCULAR CATHETERIZATION N/A 11/08/2016   Procedure: IVC FILTER INSERTION;  Surgeon: Algernon Huxley, MD;  Location: [facility];  Service: Vascular;  Laterality: N/A;  . perm a cath in Rt upper chest Right   . THORACENTESIS    . VASCULAR ACCESS DEVICE INSERTION Right 11/08/2016   Procedure: INSERTION OF HERO VASCULAR ACCESS DEVICE ( GRAFT );  Surgeon: Algernon Huxley, MD;  Location: [facility];  Service: Vascular;  Laterality: Right;    Social History   Socioeconomic History  . Marital status: Married    Spouse name: Not on file  . Number of children: Not on file  . Years of education: Not on file  . Highest education level: Not on file  Occupational History  . Not on file  Social Needs  . Financial resource strain: Not on file  . Food insecurity:    Worry: Not on file    Inability: Not on file  . Transportation needs:    Medical: Not on file    Non-medical: Not on file  Tobacco Use  . Smoking status: Never Smoker  . Smokeless tobacco: Never Used  Substance and Sexual Activity  . Alcohol use: No  . Drug use: No  . Sexual activity: Not on file  Lifestyle  . Physical activity:    Days per week: Not on file    Minutes per session: Not on file  . Stress: Not on file  Relationships  . Social connections:    Talks on phone: Not on file    Gets together: Not on file    Attends religious service: Not on file    Active member of club or organization: Not on file    Attends meetings of clubs or organizations: Not on file    Relationship status: Not on file  . Intimate partner violence:    Fear of current or ex partner: Not on file    Emotionally abused: Not on file    Physically abused: Not on file    Forced sexual activity: Not on file  Other Topics Concern  . Not on  file  Social History Narrative  . Not on file    Family History  Problem Relation Age of Onset  . Heart disease Mother   . Cancer Mother        ovarian  . Cancer Maternal Grandmother   . Cancer Maternal Grandfather   . Asthma Son   . Asthma Son     Allergies  Allergen Reactions  . Lisinopril Swelling    Facial swelling     Review of Systems   Review of Systems: Negative Unless Checked Constitutional: [] Weight loss  [] Fever  [] Chills Cardiac: [] Chest pain   []  Atrial Fibrillation  [] Palpitations   [] Shortness of breath when laying flat   [] Shortness of breath  with exertion. [] Shortness of breath at rest Vascular:  [] Pain in legs with walking   [] Pain in legs with standing [] Pain in legs when laying flat   [] Claudication    [] Pain in feet when laying flat    [] History of DVT   [] Phlebitis   [] Swelling in legs   [] Varicose veins   [] Non-healing ulcers Pulmonary:   [] Uses home oxygen   [] Productive cough   [] Hemoptysis   [] Wheeze  [] COPD   [] Asthma Neurologic:  [] Dizziness   [] Seizures  [] Blackouts [] History of stroke   [] History of TIA  [] Aphasia   [] Temporary Blindness   [] Weakness or numbness in arm   [] Weakness or numbness in leg Musculoskeletal:   [] Joint swelling   [] Joint pain   [] Low back pain  []  History of Knee Replacement [] Arthritis [] back Surgeries  []  Spinal Stenosis    Hematologic:  [] Easy bruising  [] Easy bleeding   [] Hypercoagulable state   [] Anemic Gastrointestinal:  [] Diarrhea   [] Vomiting  [] Gastroesophageal reflux/heartburn   [] Difficulty swallowing. [] Abdominal pain Genitourinary:  [] Chronic kidney disease   [] Difficult urination  [] Anuric   [] Blood in urine [] Frequent urination  [] Burning with urination   [] Hematuria Skin:  [] Rashes   [] Ulcers [] Wounds Psychological:  [] History of anxiety   []  History of major depression  []  Memory Difficulties     Objective:   Physical Exam  BP (!) 143/90 (BP Location: Left Arm, Patient Position: Sitting, Cuff Size:  Normal)   Pulse 89   Resp 16   Ht 5' 10"  (1.778 m)   Wt 178 lb (80.7 kg)   BMI 25.54 kg/m   Gen: WD/WN, NAD Head: [place]/AT, No temporalis wasting.  Ear/Nose/Throat: Hearing grossly intact, nares w/o erythema or drainage Eyes: PER, EOMI, sclera nonicteric.  Neck: Supple, no masses.  No JVD.  Pulmonary:  Good air movement, no use of accessory muscles.  Cardiac: RRR Vascular:  Good thrill and bruit of the right graft, however thrill and bruit also felt in swollen area. Vessel Right Left  Radial Palpable Palpable   Gastrointestinal: soft, non-distended. No guarding/no peritoneal signs.  Musculoskeletal: M/S 5/5 throughout.  No deformity or atrophy.  Neurologic: Pain and light touch intact in extremities.  Symmetrical.  Speech is fluent. Motor exam as listed above. Psychiatric: Judgment intact, Mood & affect appropriate for pt's clinical situation. Dermatologic: No Venous rashes. No Ulcers Noted.  No changes consistent with cellulitis. Lymph : No Cervical lymphadenopathy, no lichenification or skin changes of chronic lymphedema.      Assessment & Plan:   1. ESRD on dialysis [facility]) Recommend:  The patient has been unable to utilize his dialysis access at this point.  The swollen area above his graft has a thrill and bruit, indicating it may be either a pseudoaneurysm or aneurysm.  Patient should have an angiogram with the intention for intervention.  The intention for intervention is to restore appropriate flow and prevent thrombosis and possible loss of the access.  As well as improve the quality of dialysis therapy.  The risks, benefits and alternative therapies were reviewed in detail with the patient.  All questions were answered.  The patient agrees to proceed with angio/intervention.      2. Hyperlipidemia, unspecified hyperlipidemia type Continue statin as ordered and reviewed, no changes at this time   3. Essential hypertension Continue antihypertensive medications as  already ordered, these medications have been reviewed and there are no changes at this time.    Current Outpatient Medications on File Prior to Visit  Medication Sig Dispense Refill  . amLODipine (NORVASC) 10 MG tablet Take 10 mg by mouth daily.    Marland Kitchen aspirin EC 81 MG tablet Take 81 mg by mouth daily.     . calcitRIOL (ROCALTROL) 0.5 MCG capsule Take 1 capsule (0.5 mcg total) by mouth Every Tuesday,Thursday,and Saturday with dialysis. 15 capsule 0  . calcium acetate (PHOSLO) 667 MG capsule Take 667 mg by mouth 3 (three) times daily with meals.    . carvedilol (COREG) 25 MG tablet Take 25 mg by mouth 2 (two) times daily.   0  . ferrous sulfate 325 (65 FE) MG tablet Take 325 mg by mouth 3 (three) times daily with meals.     . hydrALAZINE (APRESOLINE) 25 MG tablet Take 1 tablet (25 mg total) by mouth 3 (three) times daily. 90 tablet 0  . losartan (COZAAR) 100 MG tablet Take 1 tablet (100 mg total) by mouth daily. 30 tablet 0  . Mesalamine 800 MG TBEC Take 1 tablet (800 mg total) by mouth 3 (three) times daily. 90 tablet 6  . clopidogrel (PLAVIX) 75 MG tablet Take 1 tablet (75 mg total) by mouth daily. (Patient not taking: Reported on 11/20/2018) 30 tablet 0   No current facility-administered medications on file prior to visit.     There are no Patient Instructions on file for this visit. No follow-ups on file.   Kris Hartmann, NP  This note was completed with Sales executive.  Any errors are purely unintentional.

## 2018-11-27 ENCOUNTER — Other Ambulatory Visit (INDEPENDENT_AMBULATORY_CARE_PROVIDER_SITE_OTHER): Payer: Self-pay | Admitting: Nurse Practitioner

## 2018-11-27 DIAGNOSIS — Z23 Encounter for immunization: Secondary | ICD-10-CM | POA: Diagnosis not present

## 2018-11-27 DIAGNOSIS — D631 Anemia in chronic kidney disease: Secondary | ICD-10-CM | POA: Diagnosis not present

## 2018-11-27 DIAGNOSIS — N2581 Secondary hyperparathyroidism of renal origin: Secondary | ICD-10-CM | POA: Diagnosis not present

## 2018-11-27 DIAGNOSIS — D509 Iron deficiency anemia, unspecified: Secondary | ICD-10-CM | POA: Diagnosis not present

## 2018-11-27 DIAGNOSIS — N186 End stage renal disease: Secondary | ICD-10-CM | POA: Diagnosis not present

## 2018-11-27 MED ORDER — CEFAZOLIN SODIUM-DEXTROSE 1-4 GM/50ML-% IV SOLN
1.0000 g | Freq: Once | INTRAVENOUS | Status: AC
  Administered 2018-11-28: 1 g via INTRAVENOUS

## 2018-11-28 ENCOUNTER — Encounter
Admission: RE | Disposition: A | Discharge status: Home / Self Care | Payer: Self-pay | Source: Home / Self Care | Attending: Vascular Surgery

## 2018-11-28 ENCOUNTER — Ambulatory Visit
Admission: RE | Admit: 2018-11-28 | Discharge: 2018-11-28 | Disposition: A | Discharge status: Home / Self Care | Payer: Medicare Other | Attending: Vascular Surgery | Admitting: Vascular Surgery

## 2018-11-28 ENCOUNTER — Other Ambulatory Visit: Payer: Self-pay

## 2018-11-28 DIAGNOSIS — T82858A Stenosis of vascular prosthetic devices, implants and grafts, initial encounter: Secondary | ICD-10-CM | POA: Diagnosis not present

## 2018-11-28 DIAGNOSIS — Z7902 Long term (current) use of antithrombotics/antiplatelets: Secondary | ICD-10-CM | POA: Diagnosis not present

## 2018-11-28 DIAGNOSIS — G4733 Obstructive sleep apnea (adult) (pediatric): Secondary | ICD-10-CM | POA: Insufficient documentation

## 2018-11-28 DIAGNOSIS — T82590A Other mechanical complication of surgically created arteriovenous fistula, initial encounter: Secondary | ICD-10-CM | POA: Diagnosis not present

## 2018-11-28 DIAGNOSIS — E785 Hyperlipidemia, unspecified: Secondary | ICD-10-CM | POA: Insufficient documentation

## 2018-11-28 DIAGNOSIS — I509 Heart failure, unspecified: Secondary | ICD-10-CM | POA: Insufficient documentation

## 2018-11-28 DIAGNOSIS — Z888 Allergy status to other drugs, medicaments and biological substances status: Secondary | ICD-10-CM | POA: Diagnosis not present

## 2018-11-28 DIAGNOSIS — Z95820 Peripheral vascular angioplasty status with implants and grafts: Secondary | ICD-10-CM | POA: Diagnosis not present

## 2018-11-28 DIAGNOSIS — Z8249 Family history of ischemic heart disease and other diseases of the circulatory system: Secondary | ICD-10-CM | POA: Insufficient documentation

## 2018-11-28 DIAGNOSIS — N186 End stage renal disease: Secondary | ICD-10-CM | POA: Diagnosis not present

## 2018-11-28 DIAGNOSIS — I132 Hypertensive heart and chronic kidney disease with heart failure and with stage 5 chronic kidney disease, or end stage renal disease: Secondary | ICD-10-CM | POA: Insufficient documentation

## 2018-11-28 DIAGNOSIS — Z79899 Other long term (current) drug therapy: Secondary | ICD-10-CM | POA: Insufficient documentation

## 2018-11-28 DIAGNOSIS — Z7982 Long term (current) use of aspirin: Secondary | ICD-10-CM | POA: Insufficient documentation

## 2018-11-28 DIAGNOSIS — J449 Chronic obstructive pulmonary disease, unspecified: Secondary | ICD-10-CM | POA: Diagnosis not present

## 2018-11-28 DIAGNOSIS — Z992 Dependence on renal dialysis: Secondary | ICD-10-CM | POA: Insufficient documentation

## 2018-11-28 DIAGNOSIS — Y832 Surgical operation with anastomosis, bypass or graft as the cause of abnormal reaction of the patient, or of later complication, without mention of misadventure at the time of the procedure: Secondary | ICD-10-CM | POA: Insufficient documentation

## 2018-11-28 DIAGNOSIS — Z940 Kidney transplant status: Secondary | ICD-10-CM | POA: Diagnosis not present

## 2018-11-28 HISTORY — PX: UPPER EXTREMITY ANGIOGRAPHY: CATH118270

## 2018-11-28 LAB — POTASSIUM (ARMC VASCULAR LAB ONLY): POTASSIUM (ARMC VASCULAR LAB): 4.9 (ref 3.5–5.1)

## 2018-11-28 SURGERY — UPPER EXTREMITY ANGIOGRAPHY
Anesthesia: Moderate Sedation | Site: Arm Upper | Laterality: Right

## 2018-11-28 MED ORDER — FENTANYL CITRATE (PF) 100 MCG/2ML IJ SOLN
INTRAMUSCULAR | Status: DC | PRN
  Administered 2018-11-28: 50 ug via INTRAVENOUS
  Administered 2018-11-28: 25 ug via INTRAVENOUS

## 2018-11-28 MED ORDER — DIPHENHYDRAMINE HCL 50 MG/ML IJ SOLN: 50.0000 mg | Freq: Once | INTRAMUSCULAR | Status: DC | PRN

## 2018-11-28 MED ORDER — HEPARIN (PORCINE) IN NACL 1000-0.9 UT/500ML-% IV SOLN
INTRAVENOUS | Status: AC
  Filled 2018-11-28: qty 1000

## 2018-11-28 MED ORDER — SODIUM CHLORIDE 0.9 % IV SOLN
INTRAVENOUS | Status: DC
  Administered 2018-11-28: 09:00:00 via INTRAVENOUS

## 2018-11-28 MED ORDER — ONDANSETRON HCL 4 MG/2ML IJ SOLN: 4.0000 mg | Freq: Four times a day (QID) | INTRAMUSCULAR | Status: DC | PRN

## 2018-11-28 MED ORDER — MIDAZOLAM HCL 2 MG/ML PO SYRP: 8.0000 mg | ORAL_SOLUTION | Freq: Once | ORAL | Status: DC | PRN

## 2018-11-28 MED ORDER — MIDAZOLAM HCL 2 MG/2ML IJ SOLN
INTRAMUSCULAR | Status: DC | PRN
  Administered 2018-11-28: 2 mg via INTRAVENOUS
  Administered 2018-11-28: 1 mg via INTRAVENOUS

## 2018-11-28 MED ORDER — METHYLPREDNISOLONE SODIUM SUCC 125 MG IJ SOLR: 125.0000 mg | Freq: Once | INTRAMUSCULAR | Status: DC | PRN

## 2018-11-28 MED ORDER — MIDAZOLAM HCL 5 MG/5ML IJ SOLN
INTRAMUSCULAR | Status: AC
  Filled 2018-11-28: qty 5

## 2018-11-28 MED ORDER — LIDOCAINE-EPINEPHRINE (PF) 1 %-1:200000 IJ SOLN
INTRAMUSCULAR | Status: AC
  Filled 2018-11-28: qty 30

## 2018-11-28 MED ORDER — FENTANYL CITRATE (PF) 100 MCG/2ML IJ SOLN
INTRAMUSCULAR | Status: AC
  Filled 2018-11-28: qty 2

## 2018-11-28 MED ORDER — IOPAMIDOL (ISOVUE-300) INJECTION 61%
INTRAVENOUS | Status: DC | PRN
  Administered 2018-11-28: 45 mL via INTRAVENOUS

## 2018-11-28 MED ORDER — FAMOTIDINE 20 MG PO TABS: 40.0000 mg | ORAL_TABLET | Freq: Once | ORAL | Status: DC | PRN

## 2018-11-28 MED ORDER — HYDROMORPHONE HCL 1 MG/ML IJ SOLN: 1.0000 mg | Freq: Once | INTRAMUSCULAR | Status: DC | PRN

## 2018-11-28 MED ORDER — HEPARIN SODIUM (PORCINE) 1000 UNIT/ML IJ SOLN
INTRAMUSCULAR | Status: AC
  Filled 2018-11-28: qty 1

## 2018-11-28 MED ORDER — CEFAZOLIN SODIUM-DEXTROSE 1-4 GM/50ML-% IV SOLN
INTRAVENOUS | Status: AC
  Administered 2018-11-28: 1 g via INTRAVENOUS
  Filled 2018-11-28: qty 50

## 2018-11-28 MED ORDER — HEPARIN SODIUM (PORCINE) 1000 UNIT/ML IJ SOLN
INTRAMUSCULAR | Status: DC | PRN
  Administered 2018-11-28: 3000 [IU] via INTRAVENOUS

## 2018-11-28 SURGICAL SUPPLY — 11 items
BALLN LUTONIX DCB 7X40X130 (BALLOONS) ×3
BALLOON LUTONIX DCB 7X40X130 (BALLOONS) IMPLANT
CANNULA 5F STIFF (CANNULA) ×2 IMPLANT
CATH BEACON 5 .035 40 KMP TP (CATHETERS) IMPLANT
CATH BEACON 5 .038 40 KMP TP (CATHETERS) ×2
DEVICE PRESTO INFLATION (MISCELLANEOUS) ×2 IMPLANT
DRAPE BRACHIAL (DRAPES) ×2 IMPLANT
GLIDEWIRE ADV .035X180CM (WIRE) ×2 IMPLANT
PACK ANGIOGRAPHY (CUSTOM PROCEDURE TRAY) ×2 IMPLANT
SHEATH BRITE TIP 6FRX5.5 (SHEATH) ×3 IMPLANT
SUT MNCRL AB 4-0 PS2 18 (SUTURE) ×2 IMPLANT

## 2018-11-28 NOTE — H&P (Signed)
[facility] History & Physical Update  The patient was interviewed and re-examined.  The patient's previous History and Physical has been reviewed and is unchanged.  There is no change in the plan of care. We plan to proceed with the scheduled procedure.  Leotis Pain, MD  11/28/2018, 8:48 AM

## 2018-11-28 NOTE — Op Note (Signed)
[facility]    OPERATIVE NOTE   PROCEDURE: 1.  Right arm hero graft cannulation under ultrasound guidance 2.  Right arm shuntogram right upper extremity angiogram 3.  Catheter placement to the right brachial artery through the graft to evaluate right upper extremity arterial perfusion 4.  Percutaneous transluminal angioplasty of the hero graft just beyond the anastomosis to the brachial artery with a 7 mm diameter by 4 cm length Lutonix drug-coated angioplasty balloon  PRE-OPERATIVE DIAGNOSIS: 1. ESRD 2.  Malfunctioning right arm hero graft 3.  Right hand pain and duplex showing diminished radial artery flow  POST-OPERATIVE DIAGNOSIS: same as above   SURGEON: Leotis Pain, MD  ANESTHESIA: local with MCS  ESTIMATED BLOOD LOSS: 5 cc  FINDING(S): 1. No extravasation from the graft to explain the seroma/hematoma.  There was some narrowing of the graft possibly from extrinsic compression in the 60% range about 3 to 4 cm beyond the anastomosis and the PTFE portion.  The remainder of the graft was patent. 2. Right upper extremity angiogram showed no significant stenosis within the brachial artery or ulnar artery.  The radial artery did have a long segment occlusion but in the hand there was reconstitution of the radial artery through the palmar arch which appeared to be complete.  SPECIMEN(S):  None  CONTRAST: 45 cc  FLUORO TIME: 2.3 minutes  MODERATE CONSCIOUS SEDATION TIME:  Approximately 30 minutes using 3 mg of Versed and 75 mcg of Fentanyl  INDICATIONS: Jonathon Conley is a 53 y.o. male who presents with malfunctioning right arm hero graft.  He has a persistent swelling that is painful.  He also has some pain in his right hand and a noninvasive study showing diminished flow through the radial artery to the hand.  The patient is scheduled for right arm angiogram and shuntogram.  The patient is aware the risks include but are not limited to: bleeding, infection,  thrombosis of the cannulated access, and possible anaphylactic reaction to the contrast.  The patient is aware of the risks of the procedure and elects to proceed forward.  DESCRIPTION: After full informed written consent was obtained, the patient was brought back to the angiography suite and placed supine upon the angiography table.  The patient was connected to monitoring equipment. Moderate conscious sedation was administered during a face to face encounter throughout the procedure with my supervision of the RN administering medicines and monitoring the patient's vital signs, pulse oximetry, telemetry and mental status throughout from the start of the procedure until the patient was taken to the recovery room The right arm was prepped and draped in the standard fashion for a percutaneous access intervention.  Under ultrasound guidance, the right arm hero graft was cannulated with a micropuncture needle under direct ultrasound guidance were it was patent and a permanent image was performed.  This was done in a retrograde fashion to allow access to the upper extremity arterial circuit as well.  The microwire was advanced into the graft and the needle was exchanged for the a microsheath.  I then upsized to a 6 Fr Sheath and imaging was performed.  Imaging was performed both through the sheath and with advancement of a Kumpe catheter into the axillary artery to evaluate the proximal portions of the graft and the inflow.  Hand injections were completed to image the access including the central venous system. This demonstrated o extravasation from the graft to explain the seroma/hematoma.  There was some narrowing of the graft  possibly from extrinsic compression in the 60% range about 3 to 4 cm beyond the anastomosis and the PTFE portion.  The remainder of the graft was patent.  Based on the images, I elected to perform angioplasty of this area to see if it would improve. I then gave the patient 3000 units of  intravenous heparin.  I then crossed the stenosis with a Magic Tourqe wire.  Based on the imaging, a 7 mm x 4 cm Lutonix drug-coated angioplasty balloon was selected.  The balloon was centered around the stenosis and inflated to 12 ATM for 1 minute(s).  On completion imaging, a 10-15 % residual stenosis was present.   Then, to evaluate his more distal right upper extremity arterial perfusion, the Kumpe catheter was flipped downstream into the brachial artery distal to the graft and advanced to the level of the elbow.  The arterial anastomosis was in the mid upper arm brachial artery.  Right upper extremity angiogram was then performed.  This showed no significant stenosis within the brachial artery or ulnar artery.  The radial artery did have a long segment occlusion but in the hand there was reconstitution of the radial artery through the palmar arch which appeared to be complete.  Based on the completion imaging, no further intervention is necessary.  The wire and balloon were removed from the sheath.  A 4-0 Monocryl purse-string suture was sewn around the sheath.  The sheath was removed while tying down the suture.  A sterile bandage was applied to the puncture site.  COMPLICATIONS: None  CONDITION: Stable   Leotis Pain  11/28/2018 10:53 AM    This note was created with Dragon Medical transcription system. Any errors in dictation are purely unintentional.

## 2018-11-29 DIAGNOSIS — N186 End stage renal disease: Secondary | ICD-10-CM | POA: Diagnosis not present

## 2018-11-29 DIAGNOSIS — Z23 Encounter for immunization: Secondary | ICD-10-CM | POA: Diagnosis not present

## 2018-11-29 DIAGNOSIS — D509 Iron deficiency anemia, unspecified: Secondary | ICD-10-CM | POA: Diagnosis not present

## 2018-11-29 DIAGNOSIS — N2581 Secondary hyperparathyroidism of renal origin: Secondary | ICD-10-CM | POA: Diagnosis not present

## 2018-11-29 DIAGNOSIS — D631 Anemia in chronic kidney disease: Secondary | ICD-10-CM | POA: Diagnosis not present

## 2018-12-02 DIAGNOSIS — N2581 Secondary hyperparathyroidism of renal origin: Secondary | ICD-10-CM | POA: Diagnosis not present

## 2018-12-02 DIAGNOSIS — N186 End stage renal disease: Secondary | ICD-10-CM | POA: Diagnosis not present

## 2018-12-02 DIAGNOSIS — D509 Iron deficiency anemia, unspecified: Secondary | ICD-10-CM | POA: Diagnosis not present

## 2018-12-02 DIAGNOSIS — Z23 Encounter for immunization: Secondary | ICD-10-CM | POA: Diagnosis not present

## 2018-12-02 DIAGNOSIS — D631 Anemia in chronic kidney disease: Secondary | ICD-10-CM | POA: Diagnosis not present

## 2018-12-05 DIAGNOSIS — N2581 Secondary hyperparathyroidism of renal origin: Secondary | ICD-10-CM | POA: Diagnosis not present

## 2018-12-05 DIAGNOSIS — N186 End stage renal disease: Secondary | ICD-10-CM | POA: Diagnosis not present

## 2018-12-05 DIAGNOSIS — D509 Iron deficiency anemia, unspecified: Secondary | ICD-10-CM | POA: Diagnosis not present

## 2018-12-05 DIAGNOSIS — Z23 Encounter for immunization: Secondary | ICD-10-CM | POA: Diagnosis not present

## 2018-12-05 DIAGNOSIS — D631 Anemia in chronic kidney disease: Secondary | ICD-10-CM | POA: Diagnosis not present

## 2018-12-09 DIAGNOSIS — N186 End stage renal disease: Secondary | ICD-10-CM | POA: Diagnosis not present

## 2018-12-09 DIAGNOSIS — Z23 Encounter for immunization: Secondary | ICD-10-CM | POA: Diagnosis not present

## 2018-12-09 DIAGNOSIS — D509 Iron deficiency anemia, unspecified: Secondary | ICD-10-CM | POA: Diagnosis not present

## 2018-12-09 DIAGNOSIS — N2581 Secondary hyperparathyroidism of renal origin: Secondary | ICD-10-CM | POA: Diagnosis not present

## 2018-12-09 DIAGNOSIS — D631 Anemia in chronic kidney disease: Secondary | ICD-10-CM | POA: Diagnosis not present

## 2018-12-11 DIAGNOSIS — D509 Iron deficiency anemia, unspecified: Secondary | ICD-10-CM | POA: Diagnosis not present

## 2018-12-11 DIAGNOSIS — N186 End stage renal disease: Secondary | ICD-10-CM | POA: Diagnosis not present

## 2018-12-11 DIAGNOSIS — Z23 Encounter for immunization: Secondary | ICD-10-CM | POA: Diagnosis not present

## 2018-12-11 DIAGNOSIS — D631 Anemia in chronic kidney disease: Secondary | ICD-10-CM | POA: Diagnosis not present

## 2018-12-11 DIAGNOSIS — N2581 Secondary hyperparathyroidism of renal origin: Secondary | ICD-10-CM | POA: Diagnosis not present

## 2018-12-13 DIAGNOSIS — Z23 Encounter for immunization: Secondary | ICD-10-CM | POA: Diagnosis not present

## 2018-12-13 DIAGNOSIS — N2581 Secondary hyperparathyroidism of renal origin: Secondary | ICD-10-CM | POA: Diagnosis not present

## 2018-12-13 DIAGNOSIS — D631 Anemia in chronic kidney disease: Secondary | ICD-10-CM | POA: Diagnosis not present

## 2018-12-13 DIAGNOSIS — N186 End stage renal disease: Secondary | ICD-10-CM | POA: Diagnosis not present

## 2018-12-13 DIAGNOSIS — D509 Iron deficiency anemia, unspecified: Secondary | ICD-10-CM | POA: Diagnosis not present

## 2018-12-14 DIAGNOSIS — N186 End stage renal disease: Secondary | ICD-10-CM | POA: Diagnosis not present

## 2018-12-14 DIAGNOSIS — Z992 Dependence on renal dialysis: Secondary | ICD-10-CM | POA: Diagnosis not present

## 2018-12-14 DIAGNOSIS — T8610 Unspecified complication of kidney transplant: Secondary | ICD-10-CM | POA: Diagnosis not present

## 2018-12-16 DIAGNOSIS — N2581 Secondary hyperparathyroidism of renal origin: Secondary | ICD-10-CM | POA: Diagnosis not present

## 2018-12-16 DIAGNOSIS — D631 Anemia in chronic kidney disease: Secondary | ICD-10-CM | POA: Diagnosis not present

## 2018-12-16 DIAGNOSIS — D509 Iron deficiency anemia, unspecified: Secondary | ICD-10-CM | POA: Diagnosis not present

## 2018-12-16 DIAGNOSIS — N186 End stage renal disease: Secondary | ICD-10-CM | POA: Diagnosis not present

## 2018-12-17 IMAGING — CR DG CHEST 2V
1 series · 2 of 2 positions shown · non-contrast
Comparison: April 09, 2018

CLINICAL DATA: Chest pain today

EXAM:
CHEST - 2 VIEW

[Series 1: dg chest 2 view · 0.14mm/px · 2 of 2 slices shown]
[im 1/2]
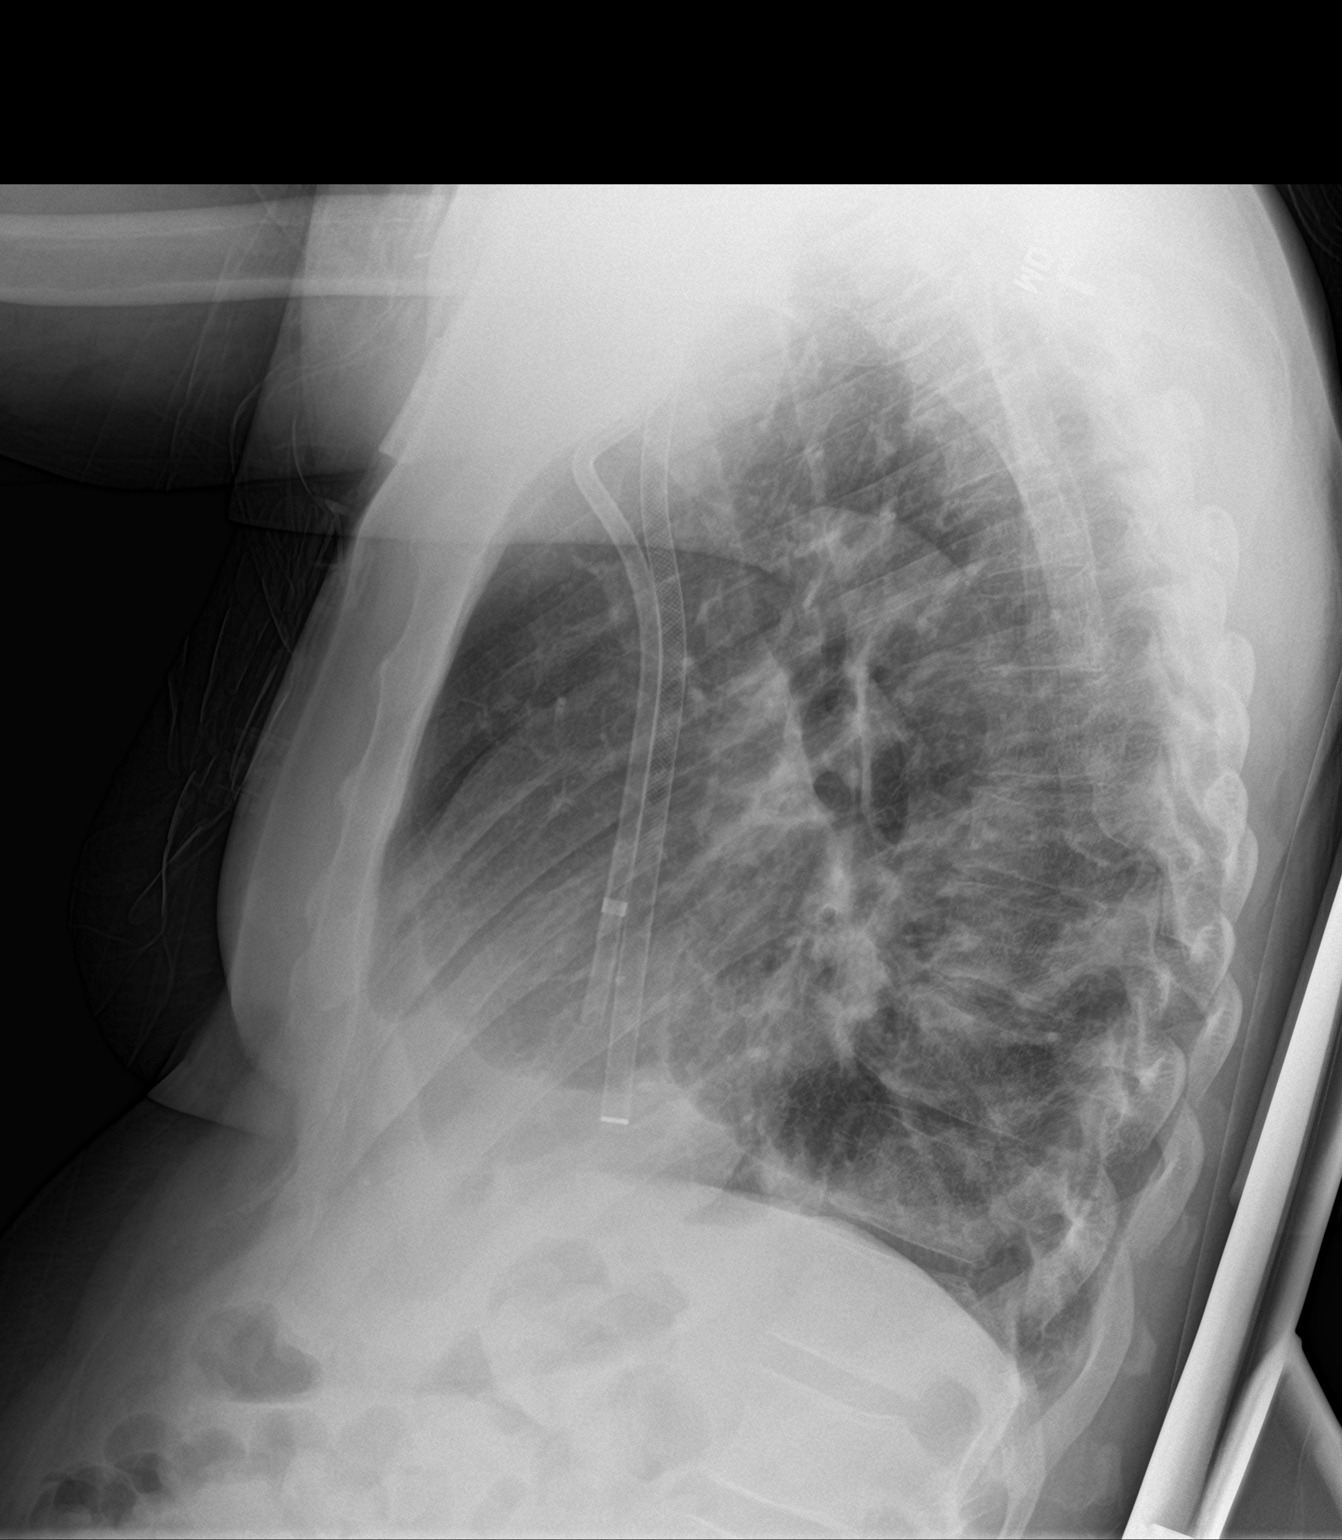
[im 2/2]
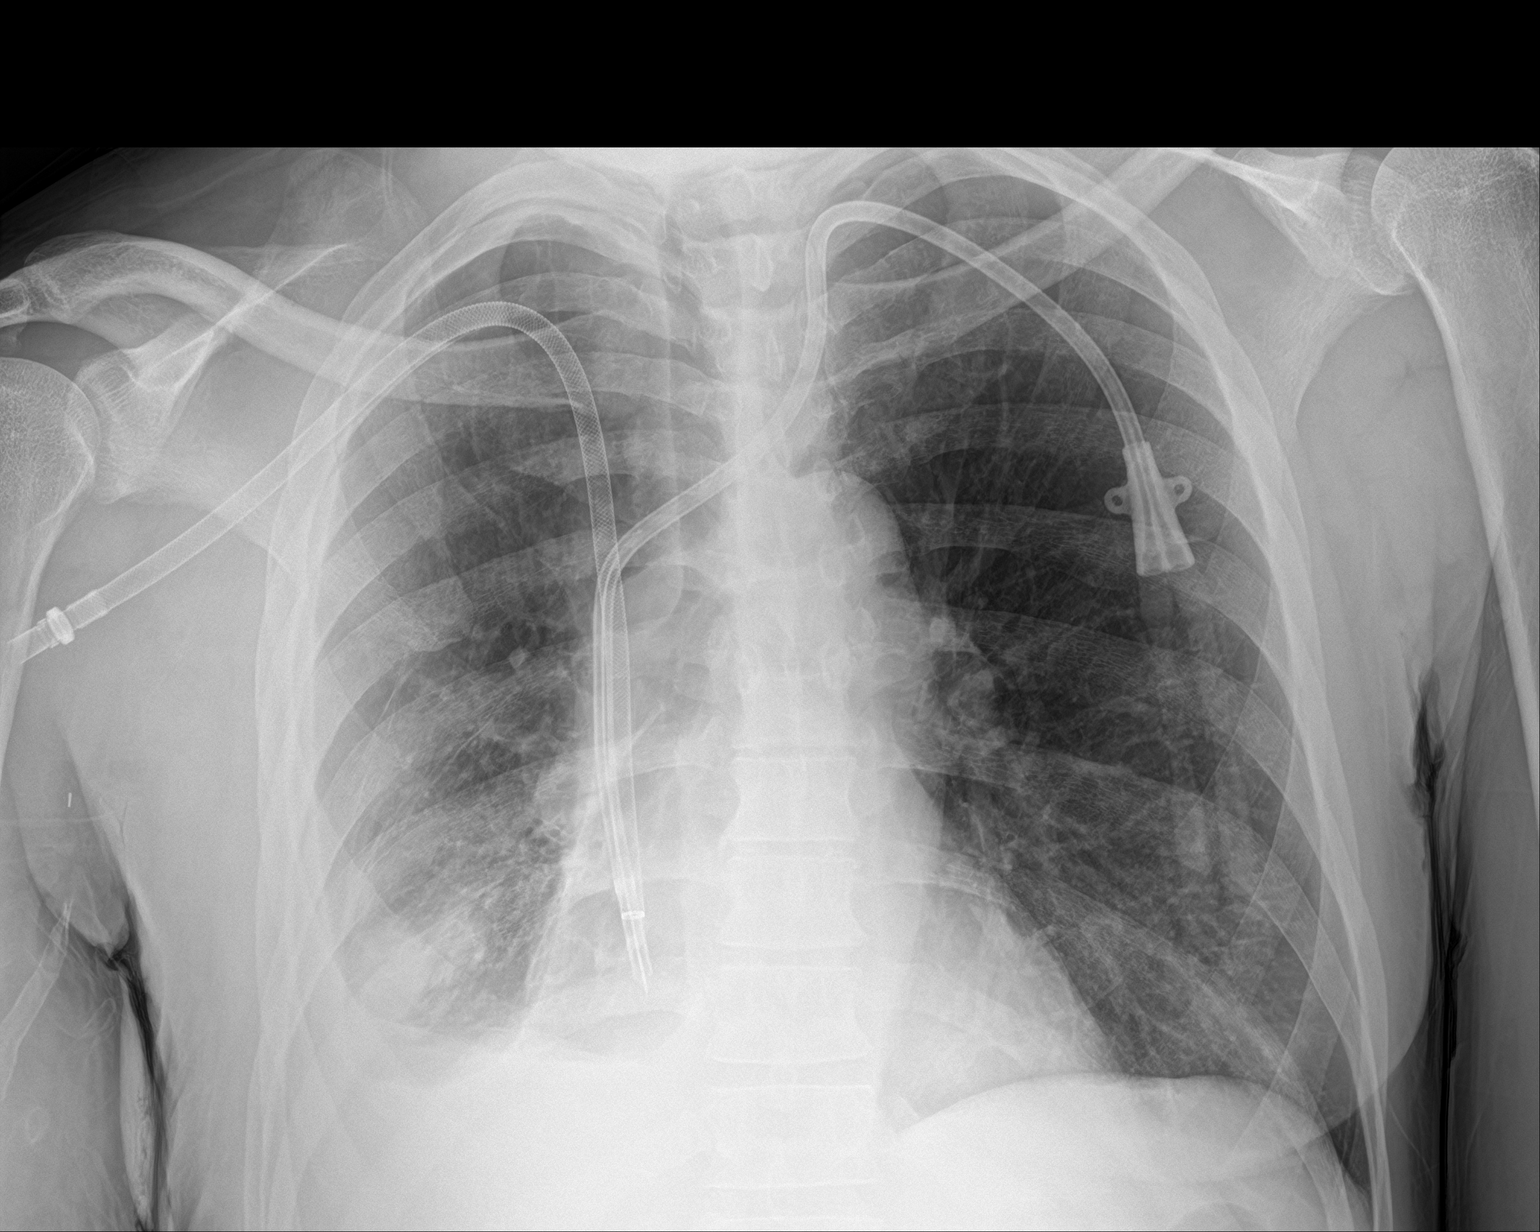

[2 of 2 positions shown; findings below may reference images not displayed]

FINDINGS: The heart size and mediastinal contours are within normal limits.
Bilateral central venous lines are unchanged. Chronic pleural
effusions/pleural parenchymal thickening of the right lung base are
stable. The left lung is clear. The visualized skeletal structures
are unremarkable.
IMPRESSION: No active cardiopulmonary disease. Stable chronic changes of right
lung.

## 2018-12-18 DIAGNOSIS — N186 End stage renal disease: Secondary | ICD-10-CM | POA: Diagnosis not present

## 2018-12-18 DIAGNOSIS — D509 Iron deficiency anemia, unspecified: Secondary | ICD-10-CM | POA: Diagnosis not present

## 2018-12-18 DIAGNOSIS — N2581 Secondary hyperparathyroidism of renal origin: Secondary | ICD-10-CM | POA: Diagnosis not present

## 2018-12-18 DIAGNOSIS — D631 Anemia in chronic kidney disease: Secondary | ICD-10-CM | POA: Diagnosis not present

## 2018-12-19 DIAGNOSIS — N5201 Erectile dysfunction due to arterial insufficiency: Secondary | ICD-10-CM | POA: Diagnosis not present

## 2018-12-23 DIAGNOSIS — N2581 Secondary hyperparathyroidism of renal origin: Secondary | ICD-10-CM | POA: Diagnosis not present

## 2018-12-23 DIAGNOSIS — D631 Anemia in chronic kidney disease: Secondary | ICD-10-CM | POA: Diagnosis not present

## 2018-12-23 DIAGNOSIS — D509 Iron deficiency anemia, unspecified: Secondary | ICD-10-CM | POA: Diagnosis not present

## 2018-12-23 DIAGNOSIS — N186 End stage renal disease: Secondary | ICD-10-CM | POA: Diagnosis not present

## 2018-12-25 DIAGNOSIS — D509 Iron deficiency anemia, unspecified: Secondary | ICD-10-CM | POA: Diagnosis not present

## 2018-12-25 DIAGNOSIS — N186 End stage renal disease: Secondary | ICD-10-CM | POA: Diagnosis not present

## 2018-12-25 DIAGNOSIS — D631 Anemia in chronic kidney disease: Secondary | ICD-10-CM | POA: Diagnosis not present

## 2018-12-25 DIAGNOSIS — N2581 Secondary hyperparathyroidism of renal origin: Secondary | ICD-10-CM | POA: Diagnosis not present

## 2018-12-30 DIAGNOSIS — N186 End stage renal disease: Secondary | ICD-10-CM | POA: Diagnosis not present

## 2018-12-30 DIAGNOSIS — D631 Anemia in chronic kidney disease: Secondary | ICD-10-CM | POA: Diagnosis not present

## 2018-12-30 DIAGNOSIS — D509 Iron deficiency anemia, unspecified: Secondary | ICD-10-CM | POA: Diagnosis not present

## 2018-12-30 DIAGNOSIS — N2581 Secondary hyperparathyroidism of renal origin: Secondary | ICD-10-CM | POA: Diagnosis not present

## 2018-12-31 DIAGNOSIS — N5201 Erectile dysfunction due to arterial insufficiency: Secondary | ICD-10-CM | POA: Diagnosis not present

## 2019-01-01 DIAGNOSIS — D631 Anemia in chronic kidney disease: Secondary | ICD-10-CM | POA: Diagnosis not present

## 2019-01-01 DIAGNOSIS — N186 End stage renal disease: Secondary | ICD-10-CM | POA: Diagnosis not present

## 2019-01-01 DIAGNOSIS — D509 Iron deficiency anemia, unspecified: Secondary | ICD-10-CM | POA: Diagnosis not present

## 2019-01-01 DIAGNOSIS — N2581 Secondary hyperparathyroidism of renal origin: Secondary | ICD-10-CM | POA: Diagnosis not present

## 2019-01-03 ENCOUNTER — Other Ambulatory Visit (INDEPENDENT_AMBULATORY_CARE_PROVIDER_SITE_OTHER): Payer: Self-pay | Admitting: Vascular Surgery

## 2019-01-03 DIAGNOSIS — Z9862 Peripheral vascular angioplasty status: Secondary | ICD-10-CM

## 2019-01-06 DIAGNOSIS — N186 End stage renal disease: Secondary | ICD-10-CM | POA: Diagnosis not present

## 2019-01-06 DIAGNOSIS — D631 Anemia in chronic kidney disease: Secondary | ICD-10-CM | POA: Diagnosis not present

## 2019-01-06 DIAGNOSIS — D509 Iron deficiency anemia, unspecified: Secondary | ICD-10-CM | POA: Diagnosis not present

## 2019-01-06 DIAGNOSIS — N2581 Secondary hyperparathyroidism of renal origin: Secondary | ICD-10-CM | POA: Diagnosis not present

## 2019-01-07 ENCOUNTER — Encounter (INDEPENDENT_AMBULATORY_CARE_PROVIDER_SITE_OTHER): Payer: Self-pay | Admitting: Vascular Surgery

## 2019-01-07 ENCOUNTER — Ambulatory Visit (INDEPENDENT_AMBULATORY_CARE_PROVIDER_SITE_OTHER): Payer: Medicare Other | Admitting: Vascular Surgery

## 2019-01-07 ENCOUNTER — Other Ambulatory Visit: Payer: Self-pay

## 2019-01-07 ENCOUNTER — Encounter (INDEPENDENT_AMBULATORY_CARE_PROVIDER_SITE_OTHER): Payer: Self-pay

## 2019-01-07 ENCOUNTER — Ambulatory Visit (INDEPENDENT_AMBULATORY_CARE_PROVIDER_SITE_OTHER): Payer: Medicare Other

## 2019-01-07 VITALS — BP 160/85 | HR 90 | Resp 10 | Ht 70.0 in | Wt 187.0 lb

## 2019-01-07 DIAGNOSIS — Z992 Dependence on renal dialysis: Secondary | ICD-10-CM

## 2019-01-07 DIAGNOSIS — Z9862 Peripheral vascular angioplasty status: Secondary | ICD-10-CM

## 2019-01-07 DIAGNOSIS — Z79899 Other long term (current) drug therapy: Secondary | ICD-10-CM | POA: Diagnosis not present

## 2019-01-07 DIAGNOSIS — N186 End stage renal disease: Secondary | ICD-10-CM

## 2019-01-07 DIAGNOSIS — E785 Hyperlipidemia, unspecified: Secondary | ICD-10-CM | POA: Diagnosis not present

## 2019-01-07 DIAGNOSIS — I120 Hypertensive chronic kidney disease with stage 5 chronic kidney disease or end stage renal disease: Secondary | ICD-10-CM | POA: Diagnosis not present

## 2019-01-07 DIAGNOSIS — I10 Essential (primary) hypertension: Secondary | ICD-10-CM

## 2019-01-07 NOTE — Progress Notes (Signed)
MRN : [id number]  Jonathon Conley is a 53 y.o. (1966/08/04) male who presents with chief complaint of  Chief Complaint  Patient presents with  . Follow-up  .  History of Present Illness: Patient returns today in follow up of his right arm HeRO graft.  He had a shuntogram several weeks ago which showed some extrinsic compression and narrowing few centimeters beyond the anastomosis to the brachial artery.  This was treated with angioplasty.  He has some residual swelling in that area but there is good flow in the graft clinically.  He says it has been running well and they have not used his catheter for several weeks. Duplex today shows some narrowing in a similar site to what was seen on the angiogram several weeks ago.  This is likely from extrinsic compression from the fluid and hematoma around the graft which was what was suspected at his angiogram.  His flow volume remains good.  The remainder of the graft looks good.  He has antegrade flow in his distal radial artery with no sign of steal.  Current Outpatient Medications  Medication Sig Dispense Refill  . aspirin EC 81 MG tablet Take 81 mg by mouth daily.     . calcitRIOL (ROCALTROL) 0.5 MCG capsule Take 1 capsule (0.5 mcg total) by mouth Every Tuesday,Thursday,and Saturday with dialysis. 15 capsule 0  . calcium acetate (PHOSLO) 667 MG capsule Take 667 mg by mouth 3 (three) times daily with meals.    . carvedilol (COREG) 25 MG tablet Take 25 mg by mouth 2 (two) times daily.   0  . ferrous sulfate 325 (65 FE) MG tablet Take 325 mg by mouth 3 (three) times daily with meals.     . hydrALAZINE (APRESOLINE) 25 MG tablet Take 1 tablet (25 mg total) by mouth 3 (three) times daily. 90 tablet 0  . losartan (COZAAR) 100 MG tablet Take 1 tablet (100 mg total) by mouth daily. 30 tablet 0  . Mesalamine 800 MG TBEC Take 1 tablet (800 mg total) by mouth 3 (three) times daily. 90 tablet 6  . amLODipine (NORVASC) 10 MG tablet Take 10 mg by mouth daily.     . clopidogrel (PLAVIX) 75 MG tablet Take 1 tablet (75 mg total) by mouth daily. (Patient not taking: Reported on 11/20/2018) 30 tablet 0   No current facility-administered medications for this visit.     Past Medical History:  Diagnosis Date  . Anemia   . Asthma    childhood  . Blood transfusion without reported diagnosis   . CHF (congestive heart failure) ([place])   . Chronic kidney disease    Dialysis Mon, Wed, Fri  . Colitis   . COPD (chronic obstructive pulmonary disease) ([place])   . Diverticulosis 04/12/16   also seen: sigmoid and rectal erythema, path:   Marland Kitchen Dyspnea   . Heart murmur   . Hemorrhoids 03/2016   bleeding at 04/12/16 colonoscopy, treated with  APC laser. cauterization  . Hyperlipidemia   . Hypertension   . OSA (obstructive sleep apnea) 03/27/2013    Past Surgical History:  Procedure Laterality Date  . APPLICATION OF WOUND VAC Right 12/13/2016   Procedure: APPLICATION OF WOUND VAC;  Surgeon: Algernon Huxley, MD;  Location: [facility];  Service: Vascular;  Laterality: Right;  . APPLICATION OF WOUND VAC Right 12/19/2016   Procedure: APPLICATION OF WOUND VAC;  Surgeon: Algernon Huxley, MD;  Location: [facility];  Service: Vascular;  Laterality: Right;  .  COLONOSCOPY N/A 04/12/2016   Procedure: COLONOSCOPY;  Surgeon: Mauri Pole, MD;  Location: [place];  Service: Endoscopy;  Laterality: N/A;  . DIALYSIS/PERMA CATHETER INSERTION N/A 02/26/2017   Procedure: Dialysis/Perma Catheter Insertion;  Surgeon: Algernon Huxley, MD;  Location: [place] [facility];  Service: Cardiovascular;  Laterality: N/A;  . DIALYSIS/PERMA CATHETER INSERTION N/A 03/26/2018   Procedure: DIALYSIS/PERMA CATHETER INSERTION;  Surgeon: Katha Cabal, MD;  Location: [place] [facility];  Service: Cardiovascular;  Laterality: N/A;  . EXCHANGE OF A DIALYSIS CATHETER  11/20/2016   Procedure: Exchange Of A Dialysis Catheter;  Surgeon: Algernon Huxley, MD;  Location: [place] [facility];  Service: Cardiovascular;;  .  fistulagrams Right   . HEMATOMA EVACUATION Right 12/13/2016   Procedure: EVACUATION HEMATOMA;  Surgeon: Algernon Huxley, MD;  Location: [facility];  Service: Vascular;  Laterality: Right;  . HEMATOMA EVACUATION Right 12/19/2016   Procedure: EVACUATIONof seroma;  Surgeon: Algernon Huxley, MD;  Location: [facility];  Service: Vascular;  Laterality: Right;  . HOT HEMOSTASIS N/A 04/12/2016   Procedure: HOT HEMOSTASIS (ARGON PLASMA COAGULATION/BICAP);  Surgeon: Mauri Pole, MD;  Location: [facility];  Service: Endoscopy;  Laterality: N/A;  . KIDNEY TRANSPLANT    . LIGATIONS OF HERO GRAFT Right 04/25/2018   Procedure: LIGATIONS OF HERO GRAFT ( REVISION );  Surgeon: Algernon Huxley, MD;  Location: [facility];  Service: Vascular;  Laterality: Right;  . PERIPHERAL VASCULAR CATHETERIZATION Bilateral 10/02/2016   Procedure: Upper Extremity Venography;  Surgeon: Algernon Huxley, MD;  Location: [place] [facility];  Service: Cardiovascular;  Laterality: Bilateral;  . PERIPHERAL VASCULAR CATHETERIZATION Right 11/08/2016   Procedure: DIALYSIS/PERMA CATHETER REMOVAL right Jugular;  Surgeon: Algernon Huxley, MD;  Location: [facility];  Service: Vascular;  Laterality: Right;  . PERIPHERAL VASCULAR CATHETERIZATION Left 11/08/2016   Procedure: DIALYSIS/PERMA CATHETER INSERTION left jugular with u/s guide and flouroscan;  Surgeon: Algernon Huxley, MD;  Location: [facility];  Service: Vascular;  Laterality: Left;  . PERIPHERAL VASCULAR CATHETERIZATION N/A 11/08/2016   Procedure: IVC FILTER INSERTION;  Surgeon: Algernon Huxley, MD;  Location: [facility];  Service: Vascular;  Laterality: N/A;  . perm a cath in Rt upper chest Right   . THORACENTESIS    . UPPER EXTREMITY ANGIOGRAPHY Right 11/28/2018   Procedure: UPPER EXTREMITY ANGIOGRAPHY;  Surgeon: Algernon Huxley, MD;  Location: [place] [facility];  Service: Cardiovascular;  Laterality: Right;  Marland Kitchen VASCULAR ACCESS DEVICE INSERTION Right 11/08/2016   Procedure: INSERTION OF HERO VASCULAR ACCESS DEVICE (  GRAFT );  Surgeon: Algernon Huxley, MD;  Location: [facility];  Service: Vascular;  Laterality: Right;    Social History Social History   Tobacco Use  . Smoking status: Never Smoker  . Smokeless tobacco: Never Used  Substance Use Topics  . Alcohol use: No  . Drug use: No    Family History Family History  Problem Relation Age of Onset  . Heart disease Mother   . Cancer Mother        ovarian  . Cancer Maternal Grandmother   . Cancer Maternal Grandfather   . Asthma Son   . Asthma Son     Allergies  Allergen Reactions  . Lisinopril Swelling    Facial swelling   REVIEW OF SYSTEMS (Negative unless checked)  Constitutional: [] ?Weight loss  [] ?Fever  [] ?Chills Cardiac: [] ?Chest pain   [] ?Chest pressure   [] ?Palpitations   [] ?Shortness of breath when laying flat   [] ?  Shortness of breath at rest   [x] ?Shortness of breath with exertion. Vascular:  [] ?Pain in legs with walking   [] ?Pain in legs at rest   [] ?Pain in legs when laying flat   [] ?Claudication   [] ?Pain in feet when walking  [] ?Pain in feet at rest  [] ?Pain in feet when laying flat   [] ?History of DVT   [] ?Phlebitis   [] ?Swelling in legs   [] ?Varicose veins   [] ?Non-healing ulcers Pulmonary:   [] ?Uses home oxygen   [] ?Productive cough   [] ?Hemoptysis   [] ?Wheeze  [] ?COPD   [] ?Asthma Neurologic:  [] ?Dizziness  [] ?Blackouts   [] ?Seizures   [] ?History of stroke   [] ?History of TIA  [] ?Aphasia   [] ?Temporary blindness   [] ?Dysphagia   [] ?Weakness or numbness in arms   [] ?Weakness or numbness in legs Musculoskeletal:  [x] ?Arthritis   [] ?Joint swelling   [x] ?Joint pain   [] ?Low back pain Hematologic:  [] ?Easy bruising  [] ?Easy bleeding   [x] ?Hypercoagulable state   [x] ?Anemic   Gastrointestinal:  [] ?Blood in stool   [] ?Vomiting blood  [] ?Gastroesophageal reflux/heartburn   [] ?Abdominal pain Genitourinary:  [x] ?Chronic kidney disease   [] ?Difficult urination  [] ?Frequent urination  [] ?Burning with urination   [] ?Hematuria Skin:   [] ?Rashes   [] ?Ulcers   [] ?Wounds Psychological:  [] ?History of anxiety   [] ? History of major depression.   Physical Examination  BP (!) 160/85 (BP Location: Left Arm, Patient Position: Sitting, Cuff Size: Small)   Pulse 90   Resp 10   Ht 5' 10"  (1.778 m)   Wt 187 lb (84.8 kg)   BMI 26.83 kg/m  Gen:  WD/WN, NAD Head: [place]/AT, No temporalis wasting. Ear/Nose/Throat: Hearing grossly intact, nares w/o erythema or drainage Eyes: Conjunctiva clear. Sclera non-icteric Neck: Supple.  Trachea midline Pulmonary:  Good air movement, no use of accessory muscles.  Cardiac: RRR, no JVD Vascular: good thrill in right arm HeRO graft Vessel Right Left  Radial Palpable Palpable                   Musculoskeletal: M/S 5/5 throughout.  No deformity or atrophy. No significant right arm edema. Neurologic: Sensation grossly intact in extremities.  Symmetrical.  Speech is fluent.  Psychiatric: Judgment intact, Mood & affect appropriate for pt's clinical situation. Dermatologic: No rashes or ulcers noted.  No cellulitis or open wounds.       Labs Recent Results (from the past 2160 hour(s))  Pot[facility] vascular lab only)     Status: None   Collection Time: 11/28/18  9:12 AM  Result Value Ref Range   Potassium [facility] vascular lab) 4.9 3.5 - 5.1    Comment: Performed at [facility], [address] [place], [place] [address]    Radiology No results found.  Assessment/Plan Accelerated hypertension This is clearly a cause of his renal failure and blood pressure control important in reducing the progression of atherosclerotic disease. On appropriate oral medications.   Hyperlipidemia lipid control important in reducing the progression of atherosclerotic disease. Continue statin therapy  ESRD on dialysis ([place]) Duplex today shows some narrowing in a similar site to what was seen on the angiogram several weeks ago.  This is likely from extrinsic compression from the fluid and  hematoma around the graft which was what was suspected at his angiogram.  His flow volume remains good.  The remainder of the graft looks good.  He has antegrade flow in his distal radial artery with no sign of steal.  At this point, the  graft is working reasonably well and he has had a longstanding catheter that we should go ahead and get out.  He has a high risk situation with some residual narrowing and a limited access situation so I will plan to see him back in 3 months with a follow-up duplex.    Leotis Pain, MD  01/07/2019 10:06 AM    This note was created with Dragon medical transcription system.  Any errors from dictation are purely unintentional

## 2019-01-07 NOTE — Assessment & Plan Note (Signed)
Duplex today shows some narrowing in a similar site to what was seen on the angiogram several weeks ago.  This is likely from extrinsic compression from the fluid and hematoma around the graft which was what was suspected at his angiogram.  His flow volume remains good.  The remainder of the graft looks good.  He has antegrade flow in his distal radial artery with no sign of steal.  At this point, the graft is working reasonably well and he has had a longstanding catheter that we should go ahead and get out.  He has a high risk situation with some residual narrowing and a limited access situation so I will plan to see him back in 3 months with a follow-up duplex.

## 2019-01-08 DIAGNOSIS — N2581 Secondary hyperparathyroidism of renal origin: Secondary | ICD-10-CM | POA: Diagnosis not present

## 2019-01-08 DIAGNOSIS — N186 End stage renal disease: Secondary | ICD-10-CM | POA: Diagnosis not present

## 2019-01-08 DIAGNOSIS — D631 Anemia in chronic kidney disease: Secondary | ICD-10-CM | POA: Diagnosis not present

## 2019-01-08 DIAGNOSIS — D509 Iron deficiency anemia, unspecified: Secondary | ICD-10-CM | POA: Diagnosis not present

## 2019-01-13 DIAGNOSIS — D509 Iron deficiency anemia, unspecified: Secondary | ICD-10-CM | POA: Diagnosis not present

## 2019-01-13 DIAGNOSIS — D631 Anemia in chronic kidney disease: Secondary | ICD-10-CM | POA: Diagnosis not present

## 2019-01-13 DIAGNOSIS — N2581 Secondary hyperparathyroidism of renal origin: Secondary | ICD-10-CM | POA: Diagnosis not present

## 2019-01-13 DIAGNOSIS — N186 End stage renal disease: Secondary | ICD-10-CM | POA: Diagnosis not present

## 2019-01-14 DIAGNOSIS — Z992 Dependence on renal dialysis: Secondary | ICD-10-CM | POA: Diagnosis not present

## 2019-01-14 DIAGNOSIS — T8610 Unspecified complication of kidney transplant: Secondary | ICD-10-CM | POA: Diagnosis not present

## 2019-01-14 DIAGNOSIS — N186 End stage renal disease: Secondary | ICD-10-CM | POA: Diagnosis not present

## 2019-01-15 ENCOUNTER — Other Ambulatory Visit (INDEPENDENT_AMBULATORY_CARE_PROVIDER_SITE_OTHER): Payer: Self-pay | Admitting: Nurse Practitioner

## 2019-01-15 DIAGNOSIS — D509 Iron deficiency anemia, unspecified: Secondary | ICD-10-CM | POA: Diagnosis not present

## 2019-01-15 DIAGNOSIS — N2581 Secondary hyperparathyroidism of renal origin: Secondary | ICD-10-CM | POA: Diagnosis not present

## 2019-01-15 DIAGNOSIS — N186 End stage renal disease: Secondary | ICD-10-CM | POA: Diagnosis not present

## 2019-01-15 DIAGNOSIS — D631 Anemia in chronic kidney disease: Secondary | ICD-10-CM | POA: Diagnosis not present

## 2019-01-15 MED ORDER — CLINDAMYCIN PHOSPHATE 300 MG/50ML IV SOLN: 300.0000 mg | Freq: Once | INTRAVENOUS | Status: DC

## 2019-01-16 ENCOUNTER — Encounter
Admission: RE | Disposition: A | Discharge status: Home / Self Care | Payer: Self-pay | Source: Home / Self Care | Attending: Vascular Surgery

## 2019-01-16 ENCOUNTER — Ambulatory Visit
Admission: RE | Admit: 2019-01-16 | Discharge: 2019-01-16 | Disposition: A | Discharge status: Home / Self Care | Payer: Medicare Other | Attending: Vascular Surgery | Admitting: Vascular Surgery

## 2019-01-16 DIAGNOSIS — Z7902 Long term (current) use of antithrombotics/antiplatelets: Secondary | ICD-10-CM | POA: Insufficient documentation

## 2019-01-16 DIAGNOSIS — G4733 Obstructive sleep apnea (adult) (pediatric): Secondary | ICD-10-CM | POA: Diagnosis not present

## 2019-01-16 DIAGNOSIS — D649 Anemia, unspecified: Secondary | ICD-10-CM | POA: Diagnosis not present

## 2019-01-16 DIAGNOSIS — E785 Hyperlipidemia, unspecified: Secondary | ICD-10-CM | POA: Diagnosis not present

## 2019-01-16 DIAGNOSIS — Z95820 Peripheral vascular angioplasty status with implants and grafts: Secondary | ICD-10-CM | POA: Insufficient documentation

## 2019-01-16 DIAGNOSIS — I509 Heart failure, unspecified: Secondary | ICD-10-CM | POA: Diagnosis not present

## 2019-01-16 DIAGNOSIS — J449 Chronic obstructive pulmonary disease, unspecified: Secondary | ICD-10-CM | POA: Diagnosis not present

## 2019-01-16 DIAGNOSIS — Z940 Kidney transplant status: Secondary | ICD-10-CM | POA: Diagnosis not present

## 2019-01-16 DIAGNOSIS — Z992 Dependence on renal dialysis: Secondary | ICD-10-CM | POA: Diagnosis not present

## 2019-01-16 DIAGNOSIS — I132 Hypertensive heart and chronic kidney disease with heart failure and with stage 5 chronic kidney disease, or end stage renal disease: Secondary | ICD-10-CM | POA: Insufficient documentation

## 2019-01-16 DIAGNOSIS — Z79899 Other long term (current) drug therapy: Secondary | ICD-10-CM | POA: Diagnosis not present

## 2019-01-16 DIAGNOSIS — Z7982 Long term (current) use of aspirin: Secondary | ICD-10-CM | POA: Diagnosis not present

## 2019-01-16 DIAGNOSIS — N186 End stage renal disease: Secondary | ICD-10-CM | POA: Diagnosis not present

## 2019-01-16 DIAGNOSIS — Z8249 Family history of ischemic heart disease and other diseases of the circulatory system: Secondary | ICD-10-CM | POA: Insufficient documentation

## 2019-01-16 DIAGNOSIS — Z452 Encounter for adjustment and management of vascular access device: Secondary | ICD-10-CM | POA: Diagnosis not present

## 2019-01-16 HISTORY — PX: DIALYSIS/PERMA CATHETER REMOVAL: CATH118289

## 2019-01-16 SURGERY — DIALYSIS/PERMA CATHETER REMOVAL
Anesthesia: LOCAL

## 2019-01-16 MED ORDER — SODIUM CHLORIDE 0.9 % IV SOLN: INTRAVENOUS | Status: DC

## 2019-01-16 MED ORDER — LIDOCAINE-EPINEPHRINE (PF) 1 %-1:200000 IJ SOLN
INTRAMUSCULAR | Status: AC
  Filled 2019-01-16: qty 10

## 2019-01-16 SURGICAL SUPPLY — 2 items
FORCEPS HALSTEAD CVD 5IN STRL (INSTRUMENTS) ×1 IMPLANT
TRAY LACERAT/PLASTIC (MISCELLANEOUS) ×1 IMPLANT

## 2019-01-16 NOTE — Discharge Instructions (Signed)
Incision Care, Adult An incision is a cut that a doctor makes in your skin for surgery (for a procedure). Most times, these cuts are closed after surgery. Your cut from surgery may be closed with stitches (sutures), staples, skin glue, or skin tape (adhesive strips). You may need to return to your doctor to have stitches or staples taken out. This may happen many days or many weeks after your surgery. The cut needs to be well cared for so it does not get infected. How to care for your cut Cut care   Follow instructions from your doctor about how to take care of your cut. Make sure you: ? Wash your hands with soap and water before you change your bandage (dressing). If you cannot use soap and water, use hand sanitizer. ? Change your bandage as told by your doctor. ? Leave stitches, skin glue, or skin tape in place. They may need to stay in place for 2 weeks or longer. If tape strips get loose and curl up, you may trim the loose edges. Do not remove tape strips completely unless your doctor says it is okay.  Check your cut area every day for signs of infection. Check for: ? More redness, swelling, or pain. ? More fluid or blood. ? Warmth. ? Pus or a bad smell.  Ask your doctor how to clean the cut. This may include: ? Using mild soap and water. ? Using a clean towel to pat the cut dry after you clean it. ? Putting a cream or ointment on the cut. Do this only as told by your doctor. ? Covering the cut with a clean bandage.  Ask your doctor when you can leave the cut uncovered.  Do not take baths, swim, or use a hot tub until your doctor says it is okay. Ask your doctor if you can take showers. You may only be allowed to take sponge baths for bathing. Medicines  If you were prescribed an antibiotic medicine, cream, or ointment, take the antibiotic or put it on the cut as told by your doctor. Do not stop taking or putting on the antibiotic even if your condition gets better.  Take  over-the-counter and prescription medicines only as told by your doctor. General instructions  Limit movement around your cut. This helps healing. ? Avoid straining, lifting, or exercise for the first month, or for as long as told by your doctor. ? Follow instructions from your doctor about going back to your normal activities. ? Ask your doctor what activities are safe.  Protect your cut from the sun when you are outside for the first 6 months, or for as long as told by your doctor. Put on sunscreen around the scar or cover up the scar.  Keep all follow-up visits as told by your doctor. This is important. Contact a doctor if:  Your have more redness, swelling, or pain around the cut.  You have more fluid or blood coming from the cut.  Your cut feels warm to the touch.  You have pus or a bad smell coming from the cut.  You have a fever or shaking chills.  You feel sick to your stomach (nauseous) or you throw up (vomit).  You are dizzy.  Your stitches or staples come undone. Get help right away if:  You have a red streak coming from your cut.  Your cut bleeds through the bandage and the bleeding does not stop with gentle pressure.  The edges of your cut  open up and separate.  You have very bad (severe) pain.  You have a rash.  You are confused.  You pass out (faint).  You have trouble breathing and you have a fast heartbeat. This information is not intended to replace advice given to you by your health care provider. Make sure you discuss any questions you have with your health care provider. Document Released: 12/25/2011 Document Revised: 06/09/2016 Document Reviewed: 06/09/2016 Elsevier Interactive Patient Education  2019 [employer].

## 2019-01-16 NOTE — Op Note (Signed)
Operative Note     Preoperative diagnosis:   1. ESRD with functional permanent access  Postoperative diagnosis:  1. ESRD with functional permanent access  Procedure:  Removal of left jugular Permcath  Surgeon:  Leotis Pain, MD  Anesthesia:  Local  EBL:  Minimal  Indication for the Procedure:  The patient has a functional permanent dialysis access and no longer needs their permcath.  This can be removed.  Risks and benefits are discussed and informed consent is obtained.  Description of the Procedure:  The patient's left neck, chest and existing catheter were sterilely prepped and draped. The area around the catheter was anesthetized copiously with 1% lidocaine. The catheter was dissected out with curved hemostats until the cuff was freed from the surrounding fibrous sheath. The fiber sheath was transected, and the catheter was then removed in its entirety using gentle traction. Pressure was held and sterile dressings were placed. The patient tolerated the procedure well and was taken to the recovery room in stable condition.     Leotis Pain  01/16/2019, 12:53 PM This note was created with Dragon Medical transcription system. Any errors in dictation are purely unintentional.

## 2019-01-16 NOTE — H&P (Signed)
[facility] History & Physical Update  The patient was interviewed and re-examined.  The patient's previous History and Physical has been reviewed and is unchanged.  There is no change in the plan of care. We plan to proceed with the scheduled procedure.  Leotis Pain, MD  01/16/2019, 12:27 PM

## 2019-01-17 ENCOUNTER — Encounter: Payer: Self-pay | Admitting: Vascular Surgery

## 2019-01-17 DIAGNOSIS — N2581 Secondary hyperparathyroidism of renal origin: Secondary | ICD-10-CM | POA: Diagnosis not present

## 2019-01-17 DIAGNOSIS — N186 End stage renal disease: Secondary | ICD-10-CM | POA: Diagnosis not present

## 2019-01-17 DIAGNOSIS — D509 Iron deficiency anemia, unspecified: Secondary | ICD-10-CM | POA: Diagnosis not present

## 2019-01-17 DIAGNOSIS — D631 Anemia in chronic kidney disease: Secondary | ICD-10-CM | POA: Diagnosis not present

## 2019-01-22 DIAGNOSIS — N2581 Secondary hyperparathyroidism of renal origin: Secondary | ICD-10-CM | POA: Diagnosis not present

## 2019-01-22 DIAGNOSIS — N186 End stage renal disease: Secondary | ICD-10-CM | POA: Diagnosis not present

## 2019-01-22 DIAGNOSIS — D631 Anemia in chronic kidney disease: Secondary | ICD-10-CM | POA: Diagnosis not present

## 2019-01-22 DIAGNOSIS — D509 Iron deficiency anemia, unspecified: Secondary | ICD-10-CM | POA: Diagnosis not present

## 2019-01-27 DIAGNOSIS — N186 End stage renal disease: Secondary | ICD-10-CM | POA: Diagnosis not present

## 2019-01-27 DIAGNOSIS — D631 Anemia in chronic kidney disease: Secondary | ICD-10-CM | POA: Diagnosis not present

## 2019-01-27 DIAGNOSIS — D509 Iron deficiency anemia, unspecified: Secondary | ICD-10-CM | POA: Diagnosis not present

## 2019-01-27 DIAGNOSIS — N2581 Secondary hyperparathyroidism of renal origin: Secondary | ICD-10-CM | POA: Diagnosis not present

## 2019-01-29 DIAGNOSIS — N186 End stage renal disease: Secondary | ICD-10-CM | POA: Diagnosis not present

## 2019-01-29 DIAGNOSIS — D509 Iron deficiency anemia, unspecified: Secondary | ICD-10-CM | POA: Diagnosis not present

## 2019-01-29 DIAGNOSIS — D631 Anemia in chronic kidney disease: Secondary | ICD-10-CM | POA: Diagnosis not present

## 2019-01-29 DIAGNOSIS — N2581 Secondary hyperparathyroidism of renal origin: Secondary | ICD-10-CM | POA: Diagnosis not present

## 2019-02-03 DIAGNOSIS — D631 Anemia in chronic kidney disease: Secondary | ICD-10-CM | POA: Diagnosis not present

## 2019-02-03 DIAGNOSIS — N2581 Secondary hyperparathyroidism of renal origin: Secondary | ICD-10-CM | POA: Diagnosis not present

## 2019-02-03 DIAGNOSIS — N186 End stage renal disease: Secondary | ICD-10-CM | POA: Diagnosis not present

## 2019-02-03 DIAGNOSIS — D509 Iron deficiency anemia, unspecified: Secondary | ICD-10-CM | POA: Diagnosis not present

## 2019-02-04 ENCOUNTER — Encounter (INDEPENDENT_AMBULATORY_CARE_PROVIDER_SITE_OTHER): Payer: Medicare Other

## 2019-02-04 ENCOUNTER — Ambulatory Visit (INDEPENDENT_AMBULATORY_CARE_PROVIDER_SITE_OTHER): Payer: Medicare Other | Admitting: Vascular Surgery

## 2019-02-05 DIAGNOSIS — D631 Anemia in chronic kidney disease: Secondary | ICD-10-CM | POA: Diagnosis not present

## 2019-02-05 DIAGNOSIS — N2581 Secondary hyperparathyroidism of renal origin: Secondary | ICD-10-CM | POA: Diagnosis not present

## 2019-02-05 DIAGNOSIS — N186 End stage renal disease: Secondary | ICD-10-CM | POA: Diagnosis not present

## 2019-02-05 DIAGNOSIS — D509 Iron deficiency anemia, unspecified: Secondary | ICD-10-CM | POA: Diagnosis not present

## 2019-02-07 DIAGNOSIS — N186 End stage renal disease: Secondary | ICD-10-CM | POA: Diagnosis not present

## 2019-02-07 DIAGNOSIS — D509 Iron deficiency anemia, unspecified: Secondary | ICD-10-CM | POA: Diagnosis not present

## 2019-02-07 DIAGNOSIS — D631 Anemia in chronic kidney disease: Secondary | ICD-10-CM | POA: Diagnosis not present

## 2019-02-07 DIAGNOSIS — N2581 Secondary hyperparathyroidism of renal origin: Secondary | ICD-10-CM | POA: Diagnosis not present

## 2019-02-12 DIAGNOSIS — D509 Iron deficiency anemia, unspecified: Secondary | ICD-10-CM | POA: Diagnosis not present

## 2019-02-12 DIAGNOSIS — N186 End stage renal disease: Secondary | ICD-10-CM | POA: Diagnosis not present

## 2019-02-12 DIAGNOSIS — D631 Anemia in chronic kidney disease: Secondary | ICD-10-CM | POA: Diagnosis not present

## 2019-02-12 DIAGNOSIS — N2581 Secondary hyperparathyroidism of renal origin: Secondary | ICD-10-CM | POA: Diagnosis not present

## 2019-02-13 DIAGNOSIS — N186 End stage renal disease: Secondary | ICD-10-CM | POA: Diagnosis not present

## 2019-02-13 DIAGNOSIS — T8610 Unspecified complication of kidney transplant: Secondary | ICD-10-CM | POA: Diagnosis not present

## 2019-02-13 DIAGNOSIS — Z992 Dependence on renal dialysis: Secondary | ICD-10-CM | POA: Diagnosis not present

## 2019-02-14 DIAGNOSIS — N2581 Secondary hyperparathyroidism of renal origin: Secondary | ICD-10-CM | POA: Diagnosis not present

## 2019-02-14 DIAGNOSIS — D509 Iron deficiency anemia, unspecified: Secondary | ICD-10-CM | POA: Diagnosis not present

## 2019-02-14 DIAGNOSIS — N186 End stage renal disease: Secondary | ICD-10-CM | POA: Diagnosis not present

## 2019-02-19 DIAGNOSIS — N2581 Secondary hyperparathyroidism of renal origin: Secondary | ICD-10-CM | POA: Diagnosis not present

## 2019-02-19 DIAGNOSIS — N186 End stage renal disease: Secondary | ICD-10-CM | POA: Diagnosis not present

## 2019-02-19 DIAGNOSIS — D509 Iron deficiency anemia, unspecified: Secondary | ICD-10-CM | POA: Diagnosis not present

## 2019-02-21 DIAGNOSIS — N186 End stage renal disease: Secondary | ICD-10-CM | POA: Diagnosis not present

## 2019-02-21 DIAGNOSIS — D509 Iron deficiency anemia, unspecified: Secondary | ICD-10-CM | POA: Diagnosis not present

## 2019-02-21 DIAGNOSIS — N2581 Secondary hyperparathyroidism of renal origin: Secondary | ICD-10-CM | POA: Diagnosis not present

## 2019-02-24 DIAGNOSIS — N2581 Secondary hyperparathyroidism of renal origin: Secondary | ICD-10-CM | POA: Diagnosis not present

## 2019-02-24 DIAGNOSIS — D509 Iron deficiency anemia, unspecified: Secondary | ICD-10-CM | POA: Diagnosis not present

## 2019-02-24 DIAGNOSIS — N186 End stage renal disease: Secondary | ICD-10-CM | POA: Diagnosis not present

## 2019-02-26 DIAGNOSIS — D509 Iron deficiency anemia, unspecified: Secondary | ICD-10-CM | POA: Diagnosis not present

## 2019-02-26 DIAGNOSIS — N2581 Secondary hyperparathyroidism of renal origin: Secondary | ICD-10-CM | POA: Diagnosis not present

## 2019-02-26 DIAGNOSIS — N186 End stage renal disease: Secondary | ICD-10-CM | POA: Diagnosis not present

## 2019-03-03 DIAGNOSIS — N2581 Secondary hyperparathyroidism of renal origin: Secondary | ICD-10-CM | POA: Diagnosis not present

## 2019-03-03 DIAGNOSIS — D509 Iron deficiency anemia, unspecified: Secondary | ICD-10-CM | POA: Diagnosis not present

## 2019-03-03 DIAGNOSIS — N186 End stage renal disease: Secondary | ICD-10-CM | POA: Diagnosis not present

## 2019-03-05 DIAGNOSIS — N2581 Secondary hyperparathyroidism of renal origin: Secondary | ICD-10-CM | POA: Diagnosis not present

## 2019-03-05 DIAGNOSIS — N186 End stage renal disease: Secondary | ICD-10-CM | POA: Diagnosis not present

## 2019-03-05 DIAGNOSIS — D509 Iron deficiency anemia, unspecified: Secondary | ICD-10-CM | POA: Diagnosis not present

## 2019-03-07 DIAGNOSIS — D509 Iron deficiency anemia, unspecified: Secondary | ICD-10-CM | POA: Diagnosis not present

## 2019-03-07 DIAGNOSIS — N2581 Secondary hyperparathyroidism of renal origin: Secondary | ICD-10-CM | POA: Diagnosis not present

## 2019-03-07 DIAGNOSIS — N186 End stage renal disease: Secondary | ICD-10-CM | POA: Diagnosis not present

## 2019-03-10 DIAGNOSIS — N2581 Secondary hyperparathyroidism of renal origin: Secondary | ICD-10-CM | POA: Diagnosis not present

## 2019-03-10 DIAGNOSIS — N186 End stage renal disease: Secondary | ICD-10-CM | POA: Diagnosis not present

## 2019-03-10 DIAGNOSIS — D509 Iron deficiency anemia, unspecified: Secondary | ICD-10-CM | POA: Diagnosis not present

## 2019-03-12 DIAGNOSIS — N186 End stage renal disease: Secondary | ICD-10-CM | POA: Diagnosis not present

## 2019-03-12 DIAGNOSIS — N2581 Secondary hyperparathyroidism of renal origin: Secondary | ICD-10-CM | POA: Diagnosis not present

## 2019-03-12 DIAGNOSIS — D509 Iron deficiency anemia, unspecified: Secondary | ICD-10-CM | POA: Diagnosis not present

## 2019-03-14 DIAGNOSIS — N2581 Secondary hyperparathyroidism of renal origin: Secondary | ICD-10-CM | POA: Diagnosis not present

## 2019-03-14 DIAGNOSIS — D509 Iron deficiency anemia, unspecified: Secondary | ICD-10-CM | POA: Diagnosis not present

## 2019-03-14 DIAGNOSIS — N186 End stage renal disease: Secondary | ICD-10-CM | POA: Diagnosis not present

## 2019-03-16 DIAGNOSIS — N186 End stage renal disease: Secondary | ICD-10-CM | POA: Diagnosis not present

## 2019-03-16 DIAGNOSIS — Z992 Dependence on renal dialysis: Secondary | ICD-10-CM | POA: Diagnosis not present

## 2019-03-16 DIAGNOSIS — T8610 Unspecified complication of kidney transplant: Secondary | ICD-10-CM | POA: Diagnosis not present

## 2019-03-17 DIAGNOSIS — N2581 Secondary hyperparathyroidism of renal origin: Secondary | ICD-10-CM | POA: Diagnosis not present

## 2019-03-17 DIAGNOSIS — D509 Iron deficiency anemia, unspecified: Secondary | ICD-10-CM | POA: Diagnosis not present

## 2019-03-17 DIAGNOSIS — N186 End stage renal disease: Secondary | ICD-10-CM | POA: Diagnosis not present

## 2019-03-18 ENCOUNTER — Ambulatory Visit (INDEPENDENT_AMBULATORY_CARE_PROVIDER_SITE_OTHER): Payer: Medicare Other | Admitting: Vascular Surgery

## 2019-03-18 ENCOUNTER — Encounter (INDEPENDENT_AMBULATORY_CARE_PROVIDER_SITE_OTHER): Payer: Medicare Other

## 2019-03-21 DIAGNOSIS — N186 End stage renal disease: Secondary | ICD-10-CM | POA: Diagnosis not present

## 2019-03-21 DIAGNOSIS — N2581 Secondary hyperparathyroidism of renal origin: Secondary | ICD-10-CM | POA: Diagnosis not present

## 2019-03-21 DIAGNOSIS — D509 Iron deficiency anemia, unspecified: Secondary | ICD-10-CM | POA: Diagnosis not present

## 2019-03-24 DIAGNOSIS — N186 End stage renal disease: Secondary | ICD-10-CM | POA: Diagnosis not present

## 2019-03-24 DIAGNOSIS — N2581 Secondary hyperparathyroidism of renal origin: Secondary | ICD-10-CM | POA: Diagnosis not present

## 2019-03-24 DIAGNOSIS — D509 Iron deficiency anemia, unspecified: Secondary | ICD-10-CM | POA: Diagnosis not present

## 2019-03-26 DIAGNOSIS — N2581 Secondary hyperparathyroidism of renal origin: Secondary | ICD-10-CM | POA: Diagnosis not present

## 2019-03-26 DIAGNOSIS — N186 End stage renal disease: Secondary | ICD-10-CM | POA: Diagnosis not present

## 2019-03-26 DIAGNOSIS — D509 Iron deficiency anemia, unspecified: Secondary | ICD-10-CM | POA: Diagnosis not present

## 2019-03-28 DIAGNOSIS — N2581 Secondary hyperparathyroidism of renal origin: Secondary | ICD-10-CM | POA: Diagnosis not present

## 2019-03-28 DIAGNOSIS — D509 Iron deficiency anemia, unspecified: Secondary | ICD-10-CM | POA: Diagnosis not present

## 2019-03-28 DIAGNOSIS — N186 End stage renal disease: Secondary | ICD-10-CM | POA: Diagnosis not present

## 2019-04-02 DIAGNOSIS — N186 End stage renal disease: Secondary | ICD-10-CM | POA: Diagnosis not present

## 2019-04-02 DIAGNOSIS — D509 Iron deficiency anemia, unspecified: Secondary | ICD-10-CM | POA: Diagnosis not present

## 2019-04-02 DIAGNOSIS — N2581 Secondary hyperparathyroidism of renal origin: Secondary | ICD-10-CM | POA: Diagnosis not present

## 2019-04-04 DIAGNOSIS — D509 Iron deficiency anemia, unspecified: Secondary | ICD-10-CM | POA: Diagnosis not present

## 2019-04-04 DIAGNOSIS — N2581 Secondary hyperparathyroidism of renal origin: Secondary | ICD-10-CM | POA: Diagnosis not present

## 2019-04-04 DIAGNOSIS — N186 End stage renal disease: Secondary | ICD-10-CM | POA: Diagnosis not present

## 2019-04-07 DIAGNOSIS — N186 End stage renal disease: Secondary | ICD-10-CM | POA: Diagnosis not present

## 2019-04-07 DIAGNOSIS — N2581 Secondary hyperparathyroidism of renal origin: Secondary | ICD-10-CM | POA: Diagnosis not present

## 2019-04-07 DIAGNOSIS — D509 Iron deficiency anemia, unspecified: Secondary | ICD-10-CM | POA: Diagnosis not present

## 2019-04-08 ENCOUNTER — Ambulatory Visit (INDEPENDENT_AMBULATORY_CARE_PROVIDER_SITE_OTHER): Payer: Medicare Other | Admitting: Vascular Surgery

## 2019-04-08 ENCOUNTER — Other Ambulatory Visit: Payer: Self-pay

## 2019-04-08 ENCOUNTER — Encounter (INDEPENDENT_AMBULATORY_CARE_PROVIDER_SITE_OTHER): Payer: Self-pay | Admitting: Vascular Surgery

## 2019-04-08 ENCOUNTER — Ambulatory Visit (INDEPENDENT_AMBULATORY_CARE_PROVIDER_SITE_OTHER): Payer: Medicare Other

## 2019-04-08 VITALS — BP 167/97 | HR 85 | Resp 16 | Wt 189.6 lb

## 2019-04-08 DIAGNOSIS — T82898A Other specified complication of vascular prosthetic devices, implants and grafts, initial encounter: Secondary | ICD-10-CM | POA: Diagnosis not present

## 2019-04-08 DIAGNOSIS — I120 Hypertensive chronic kidney disease with stage 5 chronic kidney disease or end stage renal disease: Secondary | ICD-10-CM | POA: Diagnosis not present

## 2019-04-08 DIAGNOSIS — E785 Hyperlipidemia, unspecified: Secondary | ICD-10-CM | POA: Diagnosis not present

## 2019-04-08 DIAGNOSIS — Z79899 Other long term (current) drug therapy: Secondary | ICD-10-CM | POA: Diagnosis not present

## 2019-04-08 DIAGNOSIS — Z992 Dependence on renal dialysis: Secondary | ICD-10-CM | POA: Diagnosis not present

## 2019-04-08 DIAGNOSIS — I10 Essential (primary) hypertension: Secondary | ICD-10-CM

## 2019-04-08 DIAGNOSIS — N186 End stage renal disease: Secondary | ICD-10-CM | POA: Diagnosis not present

## 2019-04-08 NOTE — Assessment & Plan Note (Signed)
His duplex today shows 2 areas of narrowing 1 near the arterial anastomosis around a known stable hematoma with a luminal narrowing down to about 2 mm.  There also appears to be some narrowing near the grommet at the connection to the central venous portion of the hero graft Particularly with his limited access options, I would strongly recommend proceeding with an angiogram with possible revascularization.  Risks and benefits were discussed and he is agreeable to proceed.

## 2019-04-08 NOTE — Progress Notes (Signed)
MRN : [id number]  Jonathon Conley is a 53 y.o. (05-17-1966) male who presents with chief complaint of  Chief Complaint  Patient presents with  . Follow-up    4week HDA  .  History of Present Illness: Patient returns today in follow up of his dialysis access.  He has very limited dialysis access options and actually has a right upper arm hero graft based off of his axillary artery.  He has had multiple failed previous interventions.  Recently, he says they have been pulling clots and having more difficult time with his access.  His duplex today shows 2 areas of narrowing 1 near the arterial anastomosis around a known stable hematoma with a luminal narrowing down to about 2 mm.  There also appears to be some narrowing near the grommet at the connection to the central venous portion of the hero graft  Current Outpatient Medications  Medication Sig Dispense Refill  . amLODipine (NORVASC) 10 MG tablet Take 10 mg by mouth daily.    Marland Kitchen aspirin EC 81 MG tablet Take 81 mg by mouth daily.     Marland Kitchen atenolol (TENORMIN) 50 MG tablet Take 50 mg by mouth daily.    . calcitRIOL (ROCALTROL) 0.5 MCG capsule Take 1 capsule (0.5 mcg total) by mouth Every Tuesday,Thursday,and Saturday with dialysis. 15 capsule 0  . calcium acetate (PHOSLO) 667 MG capsule Take 667 mg by mouth 3 (three) times daily with meals.    . carvedilol (COREG) 25 MG tablet Take 25 mg by mouth 2 (two) times daily.   0  . clopidogrel (PLAVIX) 75 MG tablet Take 1 tablet (75 mg total) by mouth daily. 30 tablet 0  . ferrous sulfate 325 (65 FE) MG tablet Take 325 mg by mouth 3 (three) times daily with meals.     . hydrALAZINE (APRESOLINE) 25 MG tablet Take 1 tablet (25 mg total) by mouth 3 (three) times daily. 90 tablet 0  . losartan (COZAAR) 100 MG tablet Take 1 tablet (100 mg total) by mouth daily. 30 tablet 0  . Mesalamine 800 MG TBEC Take 1 tablet (800 mg total) by mouth 3 (three) times daily. 90 tablet 6  . sevelamer carbonate (RENVELA) 800 MG  tablet Take 1,600-2,400 mg by mouth 5 (five) times daily.    . simvastatin (ZOCOR) 5 MG tablet Take 5 mg by mouth Nightly.    Marland Kitchen spironolactone (ALDACTONE) 25 MG tablet Take 25 mg by mouth 2 (two) times daily.     No current facility-administered medications for this visit.     Past Medical History:  Diagnosis Date  . Anemia   . Asthma    childhood  . Blood transfusion without reported diagnosis   . CHF (congestive heart failure) ([place])   . Chronic kidney disease    Dialysis Mon, Wed, Fri  . Colitis   . COPD (chronic obstructive pulmonary disease) ([place])   . Diverticulosis 04/12/16   also seen: sigmoid and rectal erythema, path:   Marland Kitchen Dyspnea   . Heart murmur   . Hemorrhoids 03/2016   bleeding at 04/12/16 colonoscopy, treated with  APC laser. cauterization  . Hyperlipidemia   . Hypertension   . OSA (obstructive sleep apnea) 03/27/2013    Past Surgical History:  Procedure Laterality Date  . APPLICATION OF WOUND VAC Right 12/13/2016   Procedure: APPLICATION OF WOUND VAC;  Surgeon: Algernon Huxley, MD;  Location: [facility];  Service: Vascular;  Laterality: Right;  . APPLICATION OF WOUND VAC Right  12/19/2016   Procedure: APPLICATION OF WOUND VAC;  Surgeon: Algernon Huxley, MD;  Location: [facility];  Service: Vascular;  Laterality: Right;  . COLONOSCOPY N/A 04/12/2016   Procedure: COLONOSCOPY;  Surgeon: Mauri Pole, MD;  Location: [facility];  Service: Endoscopy;  Laterality: N/A;  . DIALYSIS/PERMA CATHETER INSERTION N/A 02/26/2017   Procedure: Dialysis/Perma Catheter Insertion;  Surgeon: Algernon Huxley, MD;  Location: [place] [facility];  Service: Cardiovascular;  Laterality: N/A;  . DIALYSIS/PERMA CATHETER INSERTION N/A 03/26/2018   Procedure: DIALYSIS/PERMA CATHETER INSERTION;  Surgeon: Katha Cabal, MD;  Location: [place] [facility];  Service: Cardiovascular;  Laterality: N/A;  . DIALYSIS/PERMA CATHETER REMOVAL N/A 01/16/2019   Procedure: DIALYSIS/PERMA CATHETER REMOVAL;  Surgeon: Algernon Huxley, MD;  Location: [place] [facility];  Service: Cardiovascular;  Laterality: N/A;  . EXCHANGE OF A DIALYSIS CATHETER  11/20/2016   Procedure: Exchange Of A Dialysis Catheter;  Surgeon: Algernon Huxley, MD;  Location: [place] [facility];  Service: Cardiovascular;;  . fistulagrams Right   . HEMATOMA EVACUATION Right 12/13/2016   Procedure: EVACUATION HEMATOMA;  Surgeon: Algernon Huxley, MD;  Location: [facility];  Service: Vascular;  Laterality: Right;  . HEMATOMA EVACUATION Right 12/19/2016   Procedure: EVACUATIONof seroma;  Surgeon: Algernon Huxley, MD;  Location: [facility];  Service: Vascular;  Laterality: Right;  . HOT HEMOSTASIS N/A 04/12/2016   Procedure: HOT HEMOSTASIS (ARGON PLASMA COAGULATION/BICAP);  Surgeon: Mauri Pole, MD;  Location: [facility];  Service: Endoscopy;  Laterality: N/A;  . KIDNEY TRANSPLANT    . LIGATIONS OF HERO GRAFT Right 04/25/2018   Procedure: LIGATIONS OF HERO GRAFT ( REVISION );  Surgeon: Algernon Huxley, MD;  Location: [facility];  Service: Vascular;  Laterality: Right;  . PERIPHERAL VASCULAR CATHETERIZATION Bilateral 10/02/2016   Procedure: Upper Extremity Venography;  Surgeon: Algernon Huxley, MD;  Location: [place] [facility];  Service: Cardiovascular;  Laterality: Bilateral;  . PERIPHERAL VASCULAR CATHETERIZATION Right 11/08/2016   Procedure: DIALYSIS/PERMA CATHETER REMOVAL right Jugular;  Surgeon: Algernon Huxley, MD;  Location: [facility];  Service: Vascular;  Laterality: Right;  . PERIPHERAL VASCULAR CATHETERIZATION Left 11/08/2016   Procedure: DIALYSIS/PERMA CATHETER INSERTION left jugular with u/s guide and flouroscan;  Surgeon: Algernon Huxley, MD;  Location: [facility];  Service: Vascular;  Laterality: Left;  . PERIPHERAL VASCULAR CATHETERIZATION N/A 11/08/2016   Procedure: IVC FILTER INSERTION;  Surgeon: Algernon Huxley, MD;  Location: [facility];  Service: Vascular;  Laterality: N/A;  . perm a cath in Rt upper chest Right   . THORACENTESIS    . UPPER EXTREMITY ANGIOGRAPHY Right  11/28/2018   Procedure: UPPER EXTREMITY ANGIOGRAPHY;  Surgeon: Algernon Huxley, MD;  Location: [place] [facility];  Service: Cardiovascular;  Laterality: Right;  Marland Kitchen VASCULAR ACCESS DEVICE INSERTION Right 11/08/2016   Procedure: INSERTION OF HERO VASCULAR ACCESS DEVICE ( GRAFT );  Surgeon: Algernon Huxley, MD;  Location: [facility];  Service: Vascular;  Laterality: Right;    Social History Social History   Tobacco Use  . Smoking status: Never Smoker  . Smokeless tobacco: Never Used  Substance Use Topics  . Alcohol use: No  . Drug use: No     Family History Family History  Problem Relation Age of Onset  . Heart disease Mother   . Cancer Mother        ovarian  . Cancer Maternal Grandmother   . Cancer Maternal Grandfather   . Asthma Son   .  Asthma Son      Allergies  Allergen Reactions  . Lisinopril Swelling    Facial swelling     REVIEW OF SYSTEMS (Negative unless checked)  Constitutional: [] ?Weight loss  [] ?Fever  [] ?Chills Cardiac: [] ?Chest pain   [] ?Chest pressure   [] ?Palpitations   [] ?Shortness of breath when laying flat   [] ?Shortness of breath at rest   [x] ?Shortness of breath with exertion. Vascular:  [] ?Pain in legs with walking   [] ?Pain in legs at rest   [] ?Pain in legs when laying flat   [] ?Claudication   [] ?Pain in feet when walking  [] ?Pain in feet at rest  [] ?Pain in feet when laying flat   [] ?History of DVT   [] ?Phlebitis   [] ?Swelling in legs   [] ?Varicose veins   [] ?Non-healing ulcers Pulmonary:   [] ?Uses home oxygen   [] ?Productive cough   [] ?Hemoptysis   [] ?Wheeze  [] ?COPD   [] ?Asthma Neurologic:  [] ?Dizziness  [] ?Blackouts   [] ?Seizures   [] ?History of stroke   [] ?History of TIA  [] ?Aphasia   [] ?Temporary blindness   [] ?Dysphagia   [] ?Weakness or numbness in arms   [] ?Weakness or numbness in legs Musculoskeletal:  [x] ?Arthritis   [] ?Joint swelling   [x] ?Joint pain   [] ?Low back pain Hematologic:  [] ?Easy bruising  [] ?Easy bleeding   [x] ?Hypercoagulable state    [x] ?Anemic   Gastrointestinal:  [] ?Blood in stool   [] ?Vomiting blood  [] ?Gastroesophageal reflux/heartburn   [] ?Abdominal pain Genitourinary:  [x] ?Chronic kidney disease   [] ?Difficult urination  [] ?Frequent urination  [] ?Burning with urination   [] ?Hematuria Skin:  [] ?Rashes   [] ?Ulcers   [] ?Wounds Psychological:  [] ?History of anxiety   [] ? History of major depression.  Physical Examination  BP (!) 167/97 (BP Location: Left Arm)   Pulse 85   Resp 16   Wt 189 lb 9.6 oz (86 kg)   BMI 27.20 kg/m  Gen:  WD/WN, NAD Head: [place]/AT, No temporalis wasting. Ear/Nose/Throat: Hearing grossly intact, nares w/o erythema or drainage Eyes: Conjunctiva clear. Sclera non-icteric Neck: Supple.  Trachea midline Pulmonary:  Good air movement, no use of accessory muscles.  Cardiac: RRR, no JVD Vascular: Right proximal arm hero graft with moderate sized hematoma near the arterial anastomosis and somewhat weak thrill. Vessel Right Left  Radial Palpable Palpable                   Musculoskeletal: M/S 5/5 throughout.  No deformity or atrophy.  Neurologic: Sensation grossly intact in extremities.  Symmetrical.  Speech is fluent.  Psychiatric: Judgment intact, Mood & affect appropriate for pt's clinical situation. Dermatologic: No rashes or ulcers noted.  No cellulitis or open wounds.       Labs No results found for this or any previous visit (from the past 2160 hour(s)).  Radiology No results found.  Assessment/Plan Accelerated hypertension This is clearly a cause of his renal failure and blood pressure control important in reducing the progression of atherosclerotic disease. On appropriate oral medications.   Hyperlipidemia lipid control important in reducing the progression of atherosclerotic disease. Continue statin therapy  End stage renal disease ([place]) His duplex today shows 2 areas of narrowing 1 near the arterial anastomosis around a known stable hematoma with a luminal narrowing  down to about 2 mm.  There also appears to be some narrowing near the grommet at the connection to the central venous portion of the hero graft Particularly with his limited access options, I would strongly recommend proceeding with an angiogram with possible revascularization.  Risks  and benefits were discussed and he is agreeable to proceed.    Leotis Pain, MD  04/08/2019 4:51 PM    This note was created with Dragon medical transcription system.  Any errors from dictation are purely unintentional

## 2019-04-09 ENCOUNTER — Telehealth (INDEPENDENT_AMBULATORY_CARE_PROVIDER_SITE_OTHER): Payer: Self-pay

## 2019-04-09 ENCOUNTER — Encounter (INDEPENDENT_AMBULATORY_CARE_PROVIDER_SITE_OTHER): Payer: Self-pay

## 2019-04-09 DIAGNOSIS — N2581 Secondary hyperparathyroidism of renal origin: Secondary | ICD-10-CM | POA: Diagnosis not present

## 2019-04-09 DIAGNOSIS — N186 End stage renal disease: Secondary | ICD-10-CM | POA: Diagnosis not present

## 2019-04-09 DIAGNOSIS — D509 Iron deficiency anemia, unspecified: Secondary | ICD-10-CM | POA: Diagnosis not present

## 2019-04-09 NOTE — Telephone Encounter (Signed)
I spoke with Misty at [facility] regarding the patient and his procedure. Patient is scheduled with Dr. Lucky Cowboy on 04/17/2019 with arrival time of 8:00 am. Patient will do Covid testing on 04/14/2019 after dialysis between 10:30-12:30 pm. This information was faxed to [place] for the patient.

## 2019-04-11 DIAGNOSIS — N186 End stage renal disease: Secondary | ICD-10-CM | POA: Diagnosis not present

## 2019-04-11 DIAGNOSIS — D509 Iron deficiency anemia, unspecified: Secondary | ICD-10-CM | POA: Diagnosis not present

## 2019-04-11 DIAGNOSIS — N2581 Secondary hyperparathyroidism of renal origin: Secondary | ICD-10-CM | POA: Diagnosis not present

## 2019-04-13 ENCOUNTER — Other Ambulatory Visit (INDEPENDENT_AMBULATORY_CARE_PROVIDER_SITE_OTHER): Payer: Self-pay | Admitting: Nurse Practitioner

## 2019-04-14 ENCOUNTER — Other Ambulatory Visit: Payer: Self-pay

## 2019-04-14 ENCOUNTER — Other Ambulatory Visit
Admission: RE | Admit: 2019-04-14 | Discharge: 2019-04-14 | Disposition: A | Discharge status: Home / Self Care | Payer: Medicare Other | Source: Ambulatory Visit | Attending: Vascular Surgery | Admitting: Vascular Surgery

## 2019-04-14 DIAGNOSIS — N2581 Secondary hyperparathyroidism of renal origin: Secondary | ICD-10-CM | POA: Diagnosis not present

## 2019-04-14 DIAGNOSIS — Z01812 Encounter for preprocedural laboratory examination: Secondary | ICD-10-CM | POA: Diagnosis not present

## 2019-04-14 DIAGNOSIS — N186 End stage renal disease: Secondary | ICD-10-CM | POA: Diagnosis not present

## 2019-04-14 DIAGNOSIS — Z1159 Encounter for screening for other viral diseases: Secondary | ICD-10-CM | POA: Insufficient documentation

## 2019-04-14 DIAGNOSIS — D509 Iron deficiency anemia, unspecified: Secondary | ICD-10-CM | POA: Diagnosis not present

## 2019-04-15 DIAGNOSIS — T8610 Unspecified complication of kidney transplant: Secondary | ICD-10-CM | POA: Diagnosis not present

## 2019-04-15 DIAGNOSIS — N186 End stage renal disease: Secondary | ICD-10-CM | POA: Diagnosis not present

## 2019-04-15 DIAGNOSIS — Z992 Dependence on renal dialysis: Secondary | ICD-10-CM | POA: Diagnosis not present

## 2019-04-15 LAB — NOVEL CORONAVIRUS, NAA (HOSP ORDER, SEND-OUT TO REF LAB; TAT 18-24 HRS): SARS-CoV-2, NAA: NOT DETECTED

## 2019-04-16 DIAGNOSIS — N186 End stage renal disease: Secondary | ICD-10-CM | POA: Diagnosis not present

## 2019-04-16 DIAGNOSIS — D509 Iron deficiency anemia, unspecified: Secondary | ICD-10-CM | POA: Diagnosis not present

## 2019-04-16 DIAGNOSIS — N2581 Secondary hyperparathyroidism of renal origin: Secondary | ICD-10-CM | POA: Diagnosis not present

## 2019-04-16 MED ORDER — CEFAZOLIN SODIUM-DEXTROSE 1-4 GM/50ML-% IV SOLN
1.0000 g | Freq: Once | INTRAVENOUS | Status: AC
  Administered 2019-04-17: 1 g via INTRAVENOUS

## 2019-04-17 ENCOUNTER — Ambulatory Visit
Admission: RE | Admit: 2019-04-17 | Discharge: 2019-04-17 | Disposition: A | Discharge status: Home / Self Care | Payer: Medicare Other | Attending: Vascular Surgery | Admitting: Vascular Surgery

## 2019-04-17 ENCOUNTER — Encounter: Payer: Self-pay | Admitting: Vascular Surgery

## 2019-04-17 ENCOUNTER — Encounter
Admission: RE | Disposition: A | Discharge status: Home / Self Care | Payer: Self-pay | Source: Home / Self Care | Attending: Vascular Surgery

## 2019-04-17 ENCOUNTER — Other Ambulatory Visit: Payer: Self-pay

## 2019-04-17 DIAGNOSIS — Z7902 Long term (current) use of antithrombotics/antiplatelets: Secondary | ICD-10-CM | POA: Diagnosis not present

## 2019-04-17 DIAGNOSIS — I132 Hypertensive heart and chronic kidney disease with heart failure and with stage 5 chronic kidney disease, or end stage renal disease: Secondary | ICD-10-CM | POA: Insufficient documentation

## 2019-04-17 DIAGNOSIS — Z79899 Other long term (current) drug therapy: Secondary | ICD-10-CM | POA: Diagnosis not present

## 2019-04-17 DIAGNOSIS — N186 End stage renal disease: Secondary | ICD-10-CM

## 2019-04-17 DIAGNOSIS — E785 Hyperlipidemia, unspecified: Secondary | ICD-10-CM | POA: Insufficient documentation

## 2019-04-17 DIAGNOSIS — Z940 Kidney transplant status: Secondary | ICD-10-CM | POA: Insufficient documentation

## 2019-04-17 DIAGNOSIS — G4733 Obstructive sleep apnea (adult) (pediatric): Secondary | ICD-10-CM | POA: Diagnosis not present

## 2019-04-17 DIAGNOSIS — Y832 Surgical operation with anastomosis, bypass or graft as the cause of abnormal reaction of the patient, or of later complication, without mention of misadventure at the time of the procedure: Secondary | ICD-10-CM | POA: Diagnosis not present

## 2019-04-17 DIAGNOSIS — Z888 Allergy status to other drugs, medicaments and biological substances status: Secondary | ICD-10-CM | POA: Diagnosis not present

## 2019-04-17 DIAGNOSIS — Z95820 Peripheral vascular angioplasty status with implants and grafts: Secondary | ICD-10-CM | POA: Diagnosis not present

## 2019-04-17 DIAGNOSIS — J449 Chronic obstructive pulmonary disease, unspecified: Secondary | ICD-10-CM | POA: Insufficient documentation

## 2019-04-17 DIAGNOSIS — T82590A Other mechanical complication of surgically created arteriovenous fistula, initial encounter: Secondary | ICD-10-CM | POA: Insufficient documentation

## 2019-04-17 DIAGNOSIS — T82898A Other specified complication of vascular prosthetic devices, implants and grafts, initial encounter: Secondary | ICD-10-CM | POA: Diagnosis not present

## 2019-04-17 DIAGNOSIS — Z7982 Long term (current) use of aspirin: Secondary | ICD-10-CM | POA: Diagnosis not present

## 2019-04-17 DIAGNOSIS — I509 Heart failure, unspecified: Secondary | ICD-10-CM | POA: Insufficient documentation

## 2019-04-17 DIAGNOSIS — Z8249 Family history of ischemic heart disease and other diseases of the circulatory system: Secondary | ICD-10-CM | POA: Diagnosis not present

## 2019-04-17 DIAGNOSIS — Z992 Dependence on renal dialysis: Secondary | ICD-10-CM | POA: Insufficient documentation

## 2019-04-17 DIAGNOSIS — D649 Anemia, unspecified: Secondary | ICD-10-CM | POA: Diagnosis not present

## 2019-04-17 HISTORY — PX: A/V SHUNTOGRAM: CATH118297

## 2019-04-17 LAB — POTASSIUM (ARMC VASCULAR LAB ONLY): Potassium (ARMC vascular lab): 4 (ref 3.5–5.1)

## 2019-04-17 SURGERY — A/V SHUNTOGRAM
Anesthesia: Moderate Sedation | Laterality: Right

## 2019-04-17 MED ORDER — MIDAZOLAM HCL 5 MG/5ML IJ SOLN
INTRAMUSCULAR | Status: AC
  Filled 2019-04-17: qty 5

## 2019-04-17 MED ORDER — MIDAZOLAM HCL 2 MG/ML PO SYRP: 8.0000 mg | ORAL_SOLUTION | Freq: Once | ORAL | Status: DC | PRN

## 2019-04-17 MED ORDER — FENTANYL CITRATE (PF) 100 MCG/2ML IJ SOLN
INTRAMUSCULAR | Status: DC | PRN
  Administered 2019-04-17: 50 ug via INTRAVENOUS

## 2019-04-17 MED ORDER — DIPHENHYDRAMINE HCL 50 MG/ML IJ SOLN: 50.0000 mg | Freq: Once | INTRAMUSCULAR | Status: DC | PRN

## 2019-04-17 MED ORDER — HEPARIN SODIUM (PORCINE) 1000 UNIT/ML IJ SOLN
INTRAMUSCULAR | Status: AC
  Filled 2019-04-17: qty 1

## 2019-04-17 MED ORDER — ONDANSETRON HCL 4 MG/2ML IJ SOLN: 4.0000 mg | Freq: Four times a day (QID) | INTRAMUSCULAR | Status: DC | PRN

## 2019-04-17 MED ORDER — FAMOTIDINE 20 MG PO TABS: 40.0000 mg | ORAL_TABLET | Freq: Once | ORAL | Status: DC | PRN

## 2019-04-17 MED ORDER — CEFAZOLIN SODIUM-DEXTROSE 1-4 GM/50ML-% IV SOLN
INTRAVENOUS | Status: AC
  Filled 2019-04-17: qty 50

## 2019-04-17 MED ORDER — FENTANYL CITRATE (PF) 100 MCG/2ML IJ SOLN
INTRAMUSCULAR | Status: AC
  Filled 2019-04-17: qty 2

## 2019-04-17 MED ORDER — HYDROMORPHONE HCL 1 MG/ML IJ SOLN: 1.0000 mg | Freq: Once | INTRAMUSCULAR | Status: DC | PRN

## 2019-04-17 MED ORDER — METHYLPREDNISOLONE SODIUM SUCC 125 MG IJ SOLR: 125.0000 mg | Freq: Once | INTRAMUSCULAR | Status: DC | PRN

## 2019-04-17 MED ORDER — SODIUM CHLORIDE 0.9 % IV SOLN
INTRAVENOUS | Status: DC
  Administered 2019-04-17: 09:00:00 via INTRAVENOUS

## 2019-04-17 MED ORDER — HEPARIN SODIUM (PORCINE) 1000 UNIT/ML IJ SOLN
INTRAMUSCULAR | Status: DC | PRN
  Administered 2019-04-17: 3000 [IU] via INTRAVENOUS

## 2019-04-17 MED ORDER — MIDAZOLAM HCL 2 MG/2ML IJ SOLN
INTRAMUSCULAR | Status: DC | PRN
  Administered 2019-04-17: 2 mg via INTRAVENOUS

## 2019-04-17 SURGICAL SUPPLY — 8 items
CANNULA 5F STIFF (CANNULA) ×2 IMPLANT
CATH BEACON 5 .035 40 KMP TP (CATHETERS) IMPLANT
CATH BEACON 5 .038 40 KMP TP (CATHETERS) ×2
DRAPE BRACHIAL (DRAPES) ×2 IMPLANT
PACK ANGIOGRAPHY (CUSTOM PROCEDURE TRAY) ×3 IMPLANT
SHEATH BRITE TIP 6FRX5.5 (SHEATH) ×2 IMPLANT
SUT MNCRL AB 4-0 PS2 18 (SUTURE) ×2 IMPLANT
WIRE MAGIC TOR.035 180C (WIRE) ×2 IMPLANT

## 2019-04-17 NOTE — H&P (Signed)
[facility] History & Physical Update  The patient was interviewed and re-examined.  The patient's previous History and Physical has been reviewed and is unchanged.  There is no change in the plan of care. We plan to proceed with the scheduled procedure.  Leotis Pain, MD  04/17/2019, 9:11 AM

## 2019-04-17 NOTE — Op Note (Signed)
[facility]    OPERATIVE NOTE   PROCEDURE: 1.  Right arm HeRO graft cannulation under ultrasound guidance 2.  Right arm shuntogram   PRE-OPERATIVE DIAGNOSIS: 1. ESRD 2. Malfunctioning Right arm HeRO graft  POST-OPERATIVE DIAGNOSIS: same as above   SURGEON: Leotis Pain, MD  ANESTHESIA: local with MCS  ESTIMATED BLOOD LOSS: 5 cc  FINDING(S): 1. This demonstrated no significant narrowing in the area of the hematoma of significance with the worst being may be 20%.  There was about a 30% narrowing just before the grommet but no other stenosis, extravasation, or problems were seen with the graft.  SPECIMEN(S):  None  CONTRAST: 30 cc  FLUORO TIME: 1.6 minutes  MODERATE CONSCIOUS SEDATION TIME:  Approximately 15 minutes using 2 mg of Versed and 50 mcg of Fentanyl  INDICATIONS: Jonathon Conley is a 53 y.o. male who presents with malfunctioning right arm HeRO graft pulling clots with dialysis.  The patient is scheduled for right arm shuntogram.  The patient is aware the risks include but are not limited to: bleeding, infection, thrombosis of the cannulated access, and possible anaphylactic reaction to the contrast.  The patient is aware of the risks of the procedure and elects to proceed forward.  DESCRIPTION: After full informed written consent was obtained, the patient was brought back to the angiography suite and placed supine upon the angiography table.  The patient was connected to monitoring equipment. Moderate conscious sedation was administered during a face to face encounter throughout the procedure with my supervision of the RN administering medicines and monitoring the patient's vital signs, pulse oximetry, telemetry and mental status throughout from the start of the procedure until the patient was taken to the recovery room The right arm was prepped and draped in the standard fashion for a percutaneous access intervention.  Under ultrasound guidance, the  right arm HeRO graft was cannulated with a micropuncture needle under direct ultrasound guidance in a retrograde fashion near the grommet were it was patent and a permanent image was performed.  The microwire was advanced into the graft and the needle was exchanged for the a microsheath.  I then upsized to a 6 Fr Sheath and then advanced a Kumpe catheter and a Magic torque wire across the arterial anastomosis into the brachial/axillary artery and imaging was performed.  Hand injections were completed to image the access including the central venous system. This demonstrated no significant narrowing in the area of the hematoma of significance with the worst being may be 20%.  There was about a 30% narrowing just before the grommet but no other stenosis, extravasation, or problems were seen with the graft.  Based on the images, this patient will need no intervention.  A 4-0 Monocryl purse-string suture was sewn around the sheath.  The sheath was removed while tying down the suture.  A sterile bandage was applied to the puncture site.  COMPLICATIONS: None  CONDITION: Stable   Leotis Pain  04/17/2019 9:14 AM    This note was created with Dragon Medical transcription system. Any errors in dictation are purely unintentional.

## 2019-04-21 DIAGNOSIS — N186 End stage renal disease: Secondary | ICD-10-CM | POA: Diagnosis not present

## 2019-04-21 DIAGNOSIS — D509 Iron deficiency anemia, unspecified: Secondary | ICD-10-CM | POA: Diagnosis not present

## 2019-04-21 DIAGNOSIS — N2581 Secondary hyperparathyroidism of renal origin: Secondary | ICD-10-CM | POA: Diagnosis not present

## 2019-04-23 DIAGNOSIS — N186 End stage renal disease: Secondary | ICD-10-CM | POA: Diagnosis not present

## 2019-04-23 DIAGNOSIS — N2581 Secondary hyperparathyroidism of renal origin: Secondary | ICD-10-CM | POA: Diagnosis not present

## 2019-04-23 DIAGNOSIS — D509 Iron deficiency anemia, unspecified: Secondary | ICD-10-CM | POA: Diagnosis not present

## 2019-04-25 DIAGNOSIS — D509 Iron deficiency anemia, unspecified: Secondary | ICD-10-CM | POA: Diagnosis not present

## 2019-04-25 DIAGNOSIS — N186 End stage renal disease: Secondary | ICD-10-CM | POA: Diagnosis not present

## 2019-04-25 DIAGNOSIS — N2581 Secondary hyperparathyroidism of renal origin: Secondary | ICD-10-CM | POA: Diagnosis not present

## 2019-04-30 DIAGNOSIS — D509 Iron deficiency anemia, unspecified: Secondary | ICD-10-CM | POA: Diagnosis not present

## 2019-04-30 DIAGNOSIS — N186 End stage renal disease: Secondary | ICD-10-CM | POA: Diagnosis not present

## 2019-04-30 DIAGNOSIS — N2581 Secondary hyperparathyroidism of renal origin: Secondary | ICD-10-CM | POA: Diagnosis not present

## 2019-05-02 DIAGNOSIS — N186 End stage renal disease: Secondary | ICD-10-CM | POA: Diagnosis not present

## 2019-05-02 DIAGNOSIS — N2581 Secondary hyperparathyroidism of renal origin: Secondary | ICD-10-CM | POA: Diagnosis not present

## 2019-05-02 DIAGNOSIS — D509 Iron deficiency anemia, unspecified: Secondary | ICD-10-CM | POA: Diagnosis not present

## 2019-05-05 DIAGNOSIS — N2581 Secondary hyperparathyroidism of renal origin: Secondary | ICD-10-CM | POA: Diagnosis not present

## 2019-05-05 DIAGNOSIS — D509 Iron deficiency anemia, unspecified: Secondary | ICD-10-CM | POA: Diagnosis not present

## 2019-05-05 DIAGNOSIS — N186 End stage renal disease: Secondary | ICD-10-CM | POA: Diagnosis not present

## 2019-05-07 DIAGNOSIS — N186 End stage renal disease: Secondary | ICD-10-CM | POA: Diagnosis not present

## 2019-05-07 DIAGNOSIS — N2581 Secondary hyperparathyroidism of renal origin: Secondary | ICD-10-CM | POA: Diagnosis not present

## 2019-05-07 DIAGNOSIS — D509 Iron deficiency anemia, unspecified: Secondary | ICD-10-CM | POA: Diagnosis not present

## 2019-05-12 DIAGNOSIS — D509 Iron deficiency anemia, unspecified: Secondary | ICD-10-CM | POA: Diagnosis not present

## 2019-05-12 DIAGNOSIS — N2581 Secondary hyperparathyroidism of renal origin: Secondary | ICD-10-CM | POA: Diagnosis not present

## 2019-05-12 DIAGNOSIS — N186 End stage renal disease: Secondary | ICD-10-CM | POA: Diagnosis not present

## 2019-05-14 DIAGNOSIS — N186 End stage renal disease: Secondary | ICD-10-CM | POA: Diagnosis not present

## 2019-05-14 DIAGNOSIS — N2581 Secondary hyperparathyroidism of renal origin: Secondary | ICD-10-CM | POA: Diagnosis not present

## 2019-05-14 DIAGNOSIS — D509 Iron deficiency anemia, unspecified: Secondary | ICD-10-CM | POA: Diagnosis not present

## 2019-05-16 DIAGNOSIS — T8610 Unspecified complication of kidney transplant: Secondary | ICD-10-CM | POA: Diagnosis not present

## 2019-05-16 DIAGNOSIS — N186 End stage renal disease: Secondary | ICD-10-CM | POA: Diagnosis not present

## 2019-05-16 DIAGNOSIS — N2581 Secondary hyperparathyroidism of renal origin: Secondary | ICD-10-CM | POA: Diagnosis not present

## 2019-05-16 DIAGNOSIS — D509 Iron deficiency anemia, unspecified: Secondary | ICD-10-CM | POA: Diagnosis not present

## 2019-05-16 DIAGNOSIS — Z992 Dependence on renal dialysis: Secondary | ICD-10-CM | POA: Diagnosis not present

## 2019-05-21 DIAGNOSIS — Z992 Dependence on renal dialysis: Secondary | ICD-10-CM | POA: Diagnosis not present

## 2019-05-21 DIAGNOSIS — N2581 Secondary hyperparathyroidism of renal origin: Secondary | ICD-10-CM | POA: Diagnosis not present

## 2019-05-21 DIAGNOSIS — D509 Iron deficiency anemia, unspecified: Secondary | ICD-10-CM | POA: Diagnosis not present

## 2019-05-21 DIAGNOSIS — N186 End stage renal disease: Secondary | ICD-10-CM | POA: Diagnosis not present

## 2019-05-23 DIAGNOSIS — N186 End stage renal disease: Secondary | ICD-10-CM | POA: Diagnosis not present

## 2019-05-23 DIAGNOSIS — N2581 Secondary hyperparathyroidism of renal origin: Secondary | ICD-10-CM | POA: Diagnosis not present

## 2019-05-23 DIAGNOSIS — Z992 Dependence on renal dialysis: Secondary | ICD-10-CM | POA: Diagnosis not present

## 2019-05-23 DIAGNOSIS — D509 Iron deficiency anemia, unspecified: Secondary | ICD-10-CM | POA: Diagnosis not present

## 2019-05-26 DIAGNOSIS — D509 Iron deficiency anemia, unspecified: Secondary | ICD-10-CM | POA: Diagnosis not present

## 2019-05-26 DIAGNOSIS — Z992 Dependence on renal dialysis: Secondary | ICD-10-CM | POA: Diagnosis not present

## 2019-05-26 DIAGNOSIS — N2581 Secondary hyperparathyroidism of renal origin: Secondary | ICD-10-CM | POA: Diagnosis not present

## 2019-05-26 DIAGNOSIS — N186 End stage renal disease: Secondary | ICD-10-CM | POA: Diagnosis not present

## 2019-05-30 DIAGNOSIS — N2581 Secondary hyperparathyroidism of renal origin: Secondary | ICD-10-CM | POA: Diagnosis not present

## 2019-05-30 DIAGNOSIS — D509 Iron deficiency anemia, unspecified: Secondary | ICD-10-CM | POA: Diagnosis not present

## 2019-05-30 DIAGNOSIS — Z992 Dependence on renal dialysis: Secondary | ICD-10-CM | POA: Diagnosis not present

## 2019-05-30 DIAGNOSIS — N186 End stage renal disease: Secondary | ICD-10-CM | POA: Diagnosis not present

## 2019-06-02 DIAGNOSIS — D509 Iron deficiency anemia, unspecified: Secondary | ICD-10-CM | POA: Diagnosis not present

## 2019-06-02 DIAGNOSIS — N186 End stage renal disease: Secondary | ICD-10-CM | POA: Diagnosis not present

## 2019-06-02 DIAGNOSIS — N2581 Secondary hyperparathyroidism of renal origin: Secondary | ICD-10-CM | POA: Diagnosis not present

## 2019-06-02 DIAGNOSIS — Z992 Dependence on renal dialysis: Secondary | ICD-10-CM | POA: Diagnosis not present

## 2019-06-04 DIAGNOSIS — N186 End stage renal disease: Secondary | ICD-10-CM | POA: Diagnosis not present

## 2019-06-04 DIAGNOSIS — Z992 Dependence on renal dialysis: Secondary | ICD-10-CM | POA: Diagnosis not present

## 2019-06-04 DIAGNOSIS — D509 Iron deficiency anemia, unspecified: Secondary | ICD-10-CM | POA: Diagnosis not present

## 2019-06-04 DIAGNOSIS — N2581 Secondary hyperparathyroidism of renal origin: Secondary | ICD-10-CM | POA: Diagnosis not present

## 2019-06-09 DIAGNOSIS — Z992 Dependence on renal dialysis: Secondary | ICD-10-CM | POA: Diagnosis not present

## 2019-06-09 DIAGNOSIS — N2581 Secondary hyperparathyroidism of renal origin: Secondary | ICD-10-CM | POA: Diagnosis not present

## 2019-06-09 DIAGNOSIS — D509 Iron deficiency anemia, unspecified: Secondary | ICD-10-CM | POA: Diagnosis not present

## 2019-06-09 DIAGNOSIS — N186 End stage renal disease: Secondary | ICD-10-CM | POA: Diagnosis not present

## 2019-06-10 DIAGNOSIS — I871 Compression of vein: Secondary | ICD-10-CM | POA: Diagnosis not present

## 2019-06-10 DIAGNOSIS — T82858A Stenosis of vascular prosthetic devices, implants and grafts, initial encounter: Secondary | ICD-10-CM | POA: Diagnosis not present

## 2019-06-11 DIAGNOSIS — N186 End stage renal disease: Secondary | ICD-10-CM | POA: Diagnosis not present

## 2019-06-11 DIAGNOSIS — N2581 Secondary hyperparathyroidism of renal origin: Secondary | ICD-10-CM | POA: Diagnosis not present

## 2019-06-11 DIAGNOSIS — D509 Iron deficiency anemia, unspecified: Secondary | ICD-10-CM | POA: Diagnosis not present

## 2019-06-11 DIAGNOSIS — Z992 Dependence on renal dialysis: Secondary | ICD-10-CM | POA: Diagnosis not present

## 2019-06-13 DIAGNOSIS — N2581 Secondary hyperparathyroidism of renal origin: Secondary | ICD-10-CM | POA: Diagnosis not present

## 2019-06-13 DIAGNOSIS — N186 End stage renal disease: Secondary | ICD-10-CM | POA: Diagnosis not present

## 2019-06-13 DIAGNOSIS — D509 Iron deficiency anemia, unspecified: Secondary | ICD-10-CM | POA: Diagnosis not present

## 2019-06-13 DIAGNOSIS — Z992 Dependence on renal dialysis: Secondary | ICD-10-CM | POA: Diagnosis not present

## 2019-06-16 DIAGNOSIS — T8610 Unspecified complication of kidney transplant: Secondary | ICD-10-CM | POA: Diagnosis not present

## 2019-06-16 DIAGNOSIS — D509 Iron deficiency anemia, unspecified: Secondary | ICD-10-CM | POA: Diagnosis not present

## 2019-06-16 DIAGNOSIS — Z992 Dependence on renal dialysis: Secondary | ICD-10-CM | POA: Diagnosis not present

## 2019-06-16 DIAGNOSIS — N2581 Secondary hyperparathyroidism of renal origin: Secondary | ICD-10-CM | POA: Diagnosis not present

## 2019-06-16 DIAGNOSIS — N186 End stage renal disease: Secondary | ICD-10-CM | POA: Diagnosis not present

## 2019-06-18 DIAGNOSIS — N2581 Secondary hyperparathyroidism of renal origin: Secondary | ICD-10-CM | POA: Diagnosis not present

## 2019-06-18 DIAGNOSIS — D509 Iron deficiency anemia, unspecified: Secondary | ICD-10-CM | POA: Diagnosis not present

## 2019-06-18 DIAGNOSIS — D631 Anemia in chronic kidney disease: Secondary | ICD-10-CM | POA: Diagnosis not present

## 2019-06-18 DIAGNOSIS — N186 End stage renal disease: Secondary | ICD-10-CM | POA: Diagnosis not present

## 2019-06-18 DIAGNOSIS — Z992 Dependence on renal dialysis: Secondary | ICD-10-CM | POA: Diagnosis not present

## 2019-06-25 DIAGNOSIS — D631 Anemia in chronic kidney disease: Secondary | ICD-10-CM | POA: Diagnosis not present

## 2019-06-25 DIAGNOSIS — Z992 Dependence on renal dialysis: Secondary | ICD-10-CM | POA: Diagnosis not present

## 2019-06-25 DIAGNOSIS — N186 End stage renal disease: Secondary | ICD-10-CM | POA: Diagnosis not present

## 2019-06-25 DIAGNOSIS — D509 Iron deficiency anemia, unspecified: Secondary | ICD-10-CM | POA: Diagnosis not present

## 2019-06-25 DIAGNOSIS — N2581 Secondary hyperparathyroidism of renal origin: Secondary | ICD-10-CM | POA: Diagnosis not present

## 2019-06-27 DIAGNOSIS — D509 Iron deficiency anemia, unspecified: Secondary | ICD-10-CM | POA: Diagnosis not present

## 2019-06-27 DIAGNOSIS — Z992 Dependence on renal dialysis: Secondary | ICD-10-CM | POA: Diagnosis not present

## 2019-06-27 DIAGNOSIS — N2581 Secondary hyperparathyroidism of renal origin: Secondary | ICD-10-CM | POA: Diagnosis not present

## 2019-06-27 DIAGNOSIS — N186 End stage renal disease: Secondary | ICD-10-CM | POA: Diagnosis not present

## 2019-06-27 DIAGNOSIS — D631 Anemia in chronic kidney disease: Secondary | ICD-10-CM | POA: Diagnosis not present

## 2019-06-30 DIAGNOSIS — N2581 Secondary hyperparathyroidism of renal origin: Secondary | ICD-10-CM | POA: Diagnosis not present

## 2019-06-30 DIAGNOSIS — D509 Iron deficiency anemia, unspecified: Secondary | ICD-10-CM | POA: Diagnosis not present

## 2019-06-30 DIAGNOSIS — D631 Anemia in chronic kidney disease: Secondary | ICD-10-CM | POA: Diagnosis not present

## 2019-06-30 DIAGNOSIS — N186 End stage renal disease: Secondary | ICD-10-CM | POA: Diagnosis not present

## 2019-06-30 DIAGNOSIS — Z992 Dependence on renal dialysis: Secondary | ICD-10-CM | POA: Diagnosis not present

## 2019-07-04 DIAGNOSIS — N186 End stage renal disease: Secondary | ICD-10-CM | POA: Diagnosis not present

## 2019-07-04 DIAGNOSIS — D631 Anemia in chronic kidney disease: Secondary | ICD-10-CM | POA: Diagnosis not present

## 2019-07-04 DIAGNOSIS — Z992 Dependence on renal dialysis: Secondary | ICD-10-CM | POA: Diagnosis not present

## 2019-07-04 DIAGNOSIS — D509 Iron deficiency anemia, unspecified: Secondary | ICD-10-CM | POA: Diagnosis not present

## 2019-07-04 DIAGNOSIS — N2581 Secondary hyperparathyroidism of renal origin: Secondary | ICD-10-CM | POA: Diagnosis not present

## 2019-07-07 DIAGNOSIS — N2581 Secondary hyperparathyroidism of renal origin: Secondary | ICD-10-CM | POA: Diagnosis not present

## 2019-07-07 DIAGNOSIS — D631 Anemia in chronic kidney disease: Secondary | ICD-10-CM | POA: Diagnosis not present

## 2019-07-07 DIAGNOSIS — N186 End stage renal disease: Secondary | ICD-10-CM | POA: Diagnosis not present

## 2019-07-07 DIAGNOSIS — D509 Iron deficiency anemia, unspecified: Secondary | ICD-10-CM | POA: Diagnosis not present

## 2019-07-07 DIAGNOSIS — Z992 Dependence on renal dialysis: Secondary | ICD-10-CM | POA: Diagnosis not present

## 2019-07-09 DIAGNOSIS — Z992 Dependence on renal dialysis: Secondary | ICD-10-CM | POA: Diagnosis not present

## 2019-07-09 DIAGNOSIS — D631 Anemia in chronic kidney disease: Secondary | ICD-10-CM | POA: Diagnosis not present

## 2019-07-09 DIAGNOSIS — N2581 Secondary hyperparathyroidism of renal origin: Secondary | ICD-10-CM | POA: Diagnosis not present

## 2019-07-09 DIAGNOSIS — D509 Iron deficiency anemia, unspecified: Secondary | ICD-10-CM | POA: Diagnosis not present

## 2019-07-09 DIAGNOSIS — N186 End stage renal disease: Secondary | ICD-10-CM | POA: Diagnosis not present

## 2019-07-11 DIAGNOSIS — N2581 Secondary hyperparathyroidism of renal origin: Secondary | ICD-10-CM | POA: Diagnosis not present

## 2019-07-11 DIAGNOSIS — N186 End stage renal disease: Secondary | ICD-10-CM | POA: Diagnosis not present

## 2019-07-11 DIAGNOSIS — Z992 Dependence on renal dialysis: Secondary | ICD-10-CM | POA: Diagnosis not present

## 2019-07-11 DIAGNOSIS — D631 Anemia in chronic kidney disease: Secondary | ICD-10-CM | POA: Diagnosis not present

## 2019-07-11 DIAGNOSIS — D509 Iron deficiency anemia, unspecified: Secondary | ICD-10-CM | POA: Diagnosis not present

## 2019-07-14 DIAGNOSIS — Z992 Dependence on renal dialysis: Secondary | ICD-10-CM | POA: Diagnosis not present

## 2019-07-14 DIAGNOSIS — N186 End stage renal disease: Secondary | ICD-10-CM | POA: Diagnosis not present

## 2019-07-14 DIAGNOSIS — D509 Iron deficiency anemia, unspecified: Secondary | ICD-10-CM | POA: Diagnosis not present

## 2019-07-14 DIAGNOSIS — D631 Anemia in chronic kidney disease: Secondary | ICD-10-CM | POA: Diagnosis not present

## 2019-07-14 DIAGNOSIS — N2581 Secondary hyperparathyroidism of renal origin: Secondary | ICD-10-CM | POA: Diagnosis not present

## 2019-07-16 DIAGNOSIS — N186 End stage renal disease: Secondary | ICD-10-CM | POA: Diagnosis not present

## 2019-07-16 DIAGNOSIS — Z992 Dependence on renal dialysis: Secondary | ICD-10-CM | POA: Diagnosis not present

## 2019-07-16 DIAGNOSIS — T8610 Unspecified complication of kidney transplant: Secondary | ICD-10-CM | POA: Diagnosis not present

## 2019-07-18 DIAGNOSIS — Z992 Dependence on renal dialysis: Secondary | ICD-10-CM | POA: Diagnosis not present

## 2019-07-18 DIAGNOSIS — N2581 Secondary hyperparathyroidism of renal origin: Secondary | ICD-10-CM | POA: Diagnosis not present

## 2019-07-18 DIAGNOSIS — Z23 Encounter for immunization: Secondary | ICD-10-CM | POA: Diagnosis not present

## 2019-07-18 DIAGNOSIS — D509 Iron deficiency anemia, unspecified: Secondary | ICD-10-CM | POA: Diagnosis not present

## 2019-07-18 DIAGNOSIS — D631 Anemia in chronic kidney disease: Secondary | ICD-10-CM | POA: Diagnosis not present

## 2019-07-18 DIAGNOSIS — N186 End stage renal disease: Secondary | ICD-10-CM | POA: Diagnosis not present

## 2019-07-21 DIAGNOSIS — Z992 Dependence on renal dialysis: Secondary | ICD-10-CM | POA: Diagnosis not present

## 2019-07-21 DIAGNOSIS — Z23 Encounter for immunization: Secondary | ICD-10-CM | POA: Diagnosis not present

## 2019-07-21 DIAGNOSIS — D631 Anemia in chronic kidney disease: Secondary | ICD-10-CM | POA: Diagnosis not present

## 2019-07-21 DIAGNOSIS — D509 Iron deficiency anemia, unspecified: Secondary | ICD-10-CM | POA: Diagnosis not present

## 2019-07-21 DIAGNOSIS — N2581 Secondary hyperparathyroidism of renal origin: Secondary | ICD-10-CM | POA: Diagnosis not present

## 2019-07-21 DIAGNOSIS — N186 End stage renal disease: Secondary | ICD-10-CM | POA: Diagnosis not present

## 2019-07-22 ENCOUNTER — Other Ambulatory Visit: Payer: Self-pay

## 2019-07-22 ENCOUNTER — Ambulatory Visit (INDEPENDENT_AMBULATORY_CARE_PROVIDER_SITE_OTHER): Payer: Medicare Other | Admitting: Family Medicine

## 2019-07-22 ENCOUNTER — Encounter: Payer: Self-pay | Admitting: Family Medicine

## 2019-07-22 VITALS — BP 118/71 | HR 87 | Temp 98.2°F | Resp 16 | Ht 70.0 in | Wt 186.0 lb

## 2019-07-22 DIAGNOSIS — N186 End stage renal disease: Secondary | ICD-10-CM | POA: Diagnosis not present

## 2019-07-22 DIAGNOSIS — E162 Hypoglycemia, unspecified: Secondary | ICD-10-CM

## 2019-07-22 DIAGNOSIS — G4733 Obstructive sleep apnea (adult) (pediatric): Secondary | ICD-10-CM

## 2019-07-22 DIAGNOSIS — K51011 Ulcerative (chronic) pancolitis with rectal bleeding: Secondary | ICD-10-CM | POA: Insufficient documentation

## 2019-07-22 DIAGNOSIS — I10 Essential (primary) hypertension: Secondary | ICD-10-CM | POA: Diagnosis not present

## 2019-07-22 DIAGNOSIS — Z131 Encounter for screening for diabetes mellitus: Secondary | ICD-10-CM

## 2019-07-22 DIAGNOSIS — D638 Anemia in other chronic diseases classified elsewhere: Secondary | ICD-10-CM

## 2019-07-22 LAB — POCT GLYCOSYLATED HEMOGLOBIN (HGB A1C): Hemoglobin A1C: 4.8 % (ref 4.0–5.6)

## 2019-07-22 NOTE — Patient Instructions (Addendum)
Referral to ophthalmology Referral for split night sleep study.       Sleep Apnea Sleep apnea affects breathing during sleep. It causes breathing to stop for a short time or to become shallow. It can also increase the risk of:  Heart attack.  Stroke.  Being very overweight (obese).  Diabetes.  Heart failure.  Irregular heartbeat. The goal of treatment is to help you breathe normally again. What are the causes? There are three kinds of sleep apnea:  Obstructive sleep apnea. This is caused by a blocked or collapsed airway.  Central sleep apnea. This happens when the brain does not send the right signals to the muscles that control breathing.  Mixed sleep apnea. This is a combination of obstructive and central sleep apnea. The most common cause of this condition is a collapsed or blocked airway. This can happen if:  Your throat muscles are too relaxed.  Your tongue and tonsils are too large.  You are overweight.  Your airway is too small. What increases the risk?  Being overweight.  Smoking.  Having a small airway.  Being older.  Being male.  Drinking alcohol.  Taking medicines to calm yourself (sedatives or tranquilizers).  Having family members with the condition. What are the signs or symptoms?  Trouble staying asleep.  Being sleepy or tired during the day.  Getting angry a lot.  Loud snoring.  Headaches in the morning.  Not being able to focus your mind (concentrate).  Forgetting things.  Less interest in sex.  Mood swings.  Personality changes.  Feelings of sadness (depression).  Waking up a lot during the night to pee (urinate).  Dry mouth.  Sore throat. How is this diagnosed?  Your medical history.  A physical exam.  A test that is done when you are sleeping (sleep study). The test is most often done in a sleep lab but may also be done at home. How is this treated?   Sleeping on your side.  Using a medicine to get  rid of mucus in your nose (decongestant).  Avoiding the use of alcohol, medicines to help you relax, or certain pain medicines (narcotics).  Losing weight, if needed.  Changing your diet.  Not smoking.  Using a machine to open your airway while you sleep, such as: ? An oral appliance. This is a mouthpiece that shifts your lower jaw forward. ? A CPAP device. This device blows air through a mask when you breathe out (exhale). ? An EPAP device. This has valves that you put in each nostril. ? A BPAP device. This device blows air through a mask when you breathe in (inhale) and breathe out.  Having surgery if other treatments do not work. It is important to get treatment for sleep apnea. Without treatment, it can lead to:  High blood pressure.  Coronary artery disease.  In men, not being able to have an erection (impotence).  Reduced thinking ability. Follow these instructions at home: Lifestyle  Make changes that your doctor recommends.  Eat a healthy diet.  Lose weight if needed.  Avoid alcohol, medicines to help you relax, and some pain medicines.  Do not use any products that contain nicotine or tobacco, such as cigarettes, e-cigarettes, and chewing tobacco. If you need help quitting, ask your doctor. General instructions  Take over-the-counter and prescription medicines only as told by your doctor.  If you were given a machine to use while you sleep, use it only as told by your doctor.  If  you are having surgery, make sure to tell your doctor you have sleep apnea. You may need to bring your device with you.  Keep all follow-up visits as told by your doctor. This is important. Contact a doctor if:  The machine that you were given to use during sleep bothers you or does not seem to be working.  You do not get better.  You get worse. Get help right away if:  Your chest hurts.  You have trouble breathing in enough air.  You have an uncomfortable feeling in your  back, arms, or stomach.  You have trouble talking.  One side of your body feels weak.  A part of your face is hanging down. These symptoms may be an emergency. Do not wait to see if the symptoms will go away. Get medical help right away. Call your local emergency services (911 in the [place] Do not drive yourself to the hospital. Summary  This condition affects breathing during sleep.  The most common cause is a collapsed or blocked airway.  The goal of treatment is to help you breathe normally while you sleep. This information is not intended to replace advice given to you by your health care provider. Make sure you discuss any questions you have with your health care provider. Document Released: 07/11/2008 Document Revised: 07/19/2018 Document Reviewed: 05/28/2018 Elsevier Patient Education  2020 [employer].

## 2019-07-22 NOTE — Progress Notes (Signed)
New Patient visit  Subjective:    Patient ID: Jonathon Conley, male    DOB: October 23, 1965, 53 y.o.   MRN: [id number]  Chief Complaint  Patient presents with  . Establish Care  . Sleep Apnea    problems with c-pap  . Hypertension  . Hyperlipidemia    HPI Jonathon Conley, a very pleasant 53 year old male with a medical history significant for end-stage renal disease, malignant hypertension, anemia of chronic disease, and sleep apnea. Patient has a history of end-stage renal disease.  He is followed by [facility] nephrology.  Patient's primary nephrologist is Dr.Raven Conley.  He states that he has dialysis on Monday, Wednesday, and Friday.  Patient says that he underwent a kidney transplant in 2002, which failed in 2016.  Patient also has a history of hypertension.  Hypertension has been followed by nephrology.  He states that he has been taking medications consistently.  He does not check blood pressure at home.  He states that blood pressure is checked often with hemodialysis.  Patient states that he has remained active.  He is adherent to a renal diet.  He denies headache, chest pain, heart palpitations, lower extremity swelling at this time. Jonathon Conley also has a history of obstructive sleep apnea.  He currently has CPAP, which he has had for many years.  He states that his wife is noticed times that he stops breathing during sleep.  Also, patient snoring has worsened over the past several months.  He endorses daytime sleepiness and fatigue.  He normally gets around 4-6 hours of sleep per night.     Past Medical History:  Diagnosis Date  . Anemia   . Asthma    childhood  . Blood transfusion without reported diagnosis   . CHF (congestive heart failure) ([place])   . Chronic kidney disease    Dialysis Mon, Wed, Fri  . Colitis   . COPD (chronic obstructive pulmonary disease) ([place])   . Diverticulosis 04/12/16   also seen: sigmoid and rectal erythema, path:   Marland Kitchen Dyspnea   . Heart murmur   .  Hemorrhoids 03/2016   bleeding at 04/12/16 colonoscopy, treated with  APC laser. cauterization  . Hyperlipidemia   . Hypertension   . OSA (obstructive sleep apnea) 03/27/2013    Past Surgical History:  Procedure Laterality Date  . A/V SHUNTOGRAM Right 04/17/2019   Procedure: A/V SHUNTOGRAM;  Surgeon: Algernon Huxley, MD;  Location: [place] [facility];  Service: Cardiovascular;  Laterality: Right;  . APPLICATION OF WOUND VAC Right 12/13/2016   Procedure: APPLICATION OF WOUND VAC;  Surgeon: Algernon Huxley, MD;  Location: [facility];  Service: Vascular;  Laterality: Right;  . APPLICATION OF WOUND VAC Right 12/19/2016   Procedure: APPLICATION OF WOUND VAC;  Surgeon: Algernon Huxley, MD;  Location: [facility];  Service: Vascular;  Laterality: Right;  . COLONOSCOPY N/A 04/12/2016   Procedure: COLONOSCOPY;  Surgeon: Mauri Pole, MD;  Location: [facility];  Service: Endoscopy;  Laterality: N/A;  . DIALYSIS/PERMA CATHETER INSERTION N/A 02/26/2017   Procedure: Dialysis/Perma Catheter Insertion;  Surgeon: Algernon Huxley, MD;  Location: [place] [facility];  Service: Cardiovascular;  Laterality: N/A;  . DIALYSIS/PERMA CATHETER INSERTION N/A 03/26/2018   Procedure: DIALYSIS/PERMA CATHETER INSERTION;  Surgeon: Katha Cabal, MD;  Location: [place] [facility];  Service: Cardiovascular;  Laterality: N/A;  . DIALYSIS/PERMA CATHETER REMOVAL N/A 01/16/2019   Procedure: DIALYSIS/PERMA CATHETER REMOVAL;  Surgeon: Algernon Huxley, MD;  Location: [place]  [facility];  Service: Cardiovascular;  Laterality: N/A;  . EXCHANGE OF A DIALYSIS CATHETER  11/20/2016   Procedure: Exchange Of A Dialysis Catheter;  Surgeon: Algernon Huxley, MD;  Location: [facility];  Service: Cardiovascular;;  . fistulagrams Right   . HEMATOMA EVACUATION Right 12/13/2016   Procedure: EVACUATION HEMATOMA;  Surgeon: Algernon Huxley, MD;  Location: [facility];  Service: Vascular;  Laterality: Right;  . HEMATOMA EVACUATION Right 12/19/2016   Procedure:  EVACUATIONof seroma;  Surgeon: Algernon Huxley, MD;  Location: [facility];  Service: Vascular;  Laterality: Right;  . HOT HEMOSTASIS N/A 04/12/2016   Procedure: HOT HEMOSTASIS (ARGON PLASMA COAGULATION/BICAP);  Surgeon: Mauri Pole, MD;  Location: [facility];  Service: Endoscopy;  Laterality: N/A;  . KIDNEY TRANSPLANT    . LIGATIONS OF HERO GRAFT Right 04/25/2018   Procedure: LIGATIONS OF HERO GRAFT ( REVISION );  Surgeon: Algernon Huxley, MD;  Location: [facility];  Service: Vascular;  Laterality: Right;  . PERIPHERAL VASCULAR CATHETERIZATION Bilateral 10/02/2016   Procedure: Upper Extremity Venography;  Surgeon: Algernon Huxley, MD;  Location: [place] [facility];  Service: Cardiovascular;  Laterality: Bilateral;  . PERIPHERAL VASCULAR CATHETERIZATION Right 11/08/2016   Procedure: DIALYSIS/PERMA CATHETER REMOVAL right Jugular;  Surgeon: Algernon Huxley, MD;  Location: [facility];  Service: Vascular;  Laterality: Right;  . PERIPHERAL VASCULAR CATHETERIZATION Left 11/08/2016   Procedure: DIALYSIS/PERMA CATHETER INSERTION left jugular with u/s guide and flouroscan;  Surgeon: Algernon Huxley, MD;  Location: [facility];  Service: Vascular;  Laterality: Left;  . PERIPHERAL VASCULAR CATHETERIZATION N/A 11/08/2016   Procedure: IVC FILTER INSERTION;  Surgeon: Algernon Huxley, MD;  Location: [facility];  Service: Vascular;  Laterality: N/A;  . perm a cath in Rt upper chest Right   . THORACENTESIS    . UPPER EXTREMITY ANGIOGRAPHY Right 11/28/2018   Procedure: UPPER EXTREMITY ANGIOGRAPHY;  Surgeon: Algernon Huxley, MD;  Location: [place] [facility];  Service: Cardiovascular;  Laterality: Right;  Marland Kitchen VASCULAR ACCESS DEVICE INSERTION Right 11/08/2016   Procedure: INSERTION OF HERO VASCULAR ACCESS DEVICE ( GRAFT );  Surgeon: Algernon Huxley, MD;  Location: [facility];  Service: Vascular;  Laterality: Right;    Family History  Problem Relation Age of Onset  . Heart disease Mother   . Cancer Mother        ovarian  . Cancer Maternal  Grandmother   . Cancer Maternal Grandfather   . Asthma Son   . Asthma Son     Social History   Socioeconomic History  . Marital status: Married    Spouse name: Not on file  . Number of children: Not on file  . Years of education: Not on file  . Highest education level: Not on file  Occupational History  . Not on file  Social Needs  . Financial resource strain: Not on file  . Food insecurity    Worry: Not on file    Inability: Not on file  . Transportation needs    Medical: Not on file    Non-medical: Not on file  Tobacco Use  . Smoking status: Never Smoker  . Smokeless tobacco: Never Used  Substance and Sexual Activity  . Alcohol use: No  . Drug use: No  . Sexual activity: Not on file  Lifestyle  . Physical activity    Days per week: Not on file    Minutes per session: Not on file  . Stress: Not on file  Relationships  . Social Herbalist on phone: Not on file    Gets together: Not on file    Attends religious service: Not on file    Active member of club or organization: Not on file    Attends meetings of clubs or organizations: Not on file    Relationship status: Not on file  . Intimate partner violence    Fear of current or ex partner: Not on file    Emotionally abused: Not on file    Physically abused: Not on file    Forced sexual activity: Not on file  Other Topics Concern  . Not on file  Social History Narrative  . Not on file    Outpatient Medications Prior to Visit  Medication Sig Dispense Refill  . aspirin EC 81 MG tablet Take 81 mg by mouth daily.     . calcitRIOL (ROCALTROL) 0.5 MCG capsule Take 1 capsule (0.5 mcg total) by mouth Every Tuesday,Thursday,and Saturday with dialysis. 15 capsule 0  . calcium acetate (PHOSLO) 667 MG capsule Take 667 mg by mouth 3 (three) times daily with meals.    . carvedilol (COREG) 25 MG tablet Take 25 mg by mouth 2 (two) times daily.   0  . hydrALAZINE (APRESOLINE) 25 MG tablet Take 1 tablet (25 mg  total) by mouth 3 (three) times daily. 90 tablet 0  . losartan (COZAAR) 100 MG tablet Take 1 tablet (100 mg total) by mouth daily. 30 tablet 0  . simvastatin (ZOCOR) 5 MG tablet Take 5 mg by mouth Nightly.    . Mesalamine 800 MG TBEC Take 1 tablet (800 mg total) by mouth 3 (three) times daily. (Patient not taking: Reported on 07/22/2019) 90 tablet 6  . sevelamer carbonate (RENVELA) 800 MG tablet Take 1,600-2,400 mg by mouth 5 (five) times daily.    Marland Kitchen spironolactone (ALDACTONE) 25 MG tablet Take 25 mg by mouth 2 (two) times daily.    Marland Kitchen amLODipine (NORVASC) 10 MG tablet Take 10 mg by mouth daily.    Marland Kitchen atenolol (TENORMIN) 50 MG tablet Take 50 mg by mouth daily.    . clopidogrel (PLAVIX) 75 MG tablet Take 1 tablet (75 mg total) by mouth daily. (Patient not taking: Reported on 04/17/2019) 30 tablet 0  . ferrous sulfate 325 (65 FE) MG tablet Take 325 mg by mouth 3 (three) times daily with meals.      No facility-administered medications prior to visit.     Allergies  Allergen Reactions  . Lisinopril Swelling    Facial swelling    Review of Systems  Constitutional: Negative for chills and fever.  HENT: Negative.   Respiratory: Negative.  Negative for sputum production and shortness of breath.   Cardiovascular: Negative for chest pain, palpitations and orthopnea.  Gastrointestinal: Negative.  Negative for constipation, diarrhea, heartburn and nausea.  Genitourinary: Negative.  Negative for dysuria and urgency.  Musculoskeletal: Negative.  Negative for back pain, joint pain, myalgias and neck pain.  Skin: Negative.   Neurological: Negative.  Negative for dizziness and headaches.  Psychiatric/Behavioral: Negative for hallucinations and substance abuse. The patient is not nervous/anxious.        Objective:    Physical Exam  Constitutional: He is oriented to person, place, and time. He appears well-developed and well-nourished. No distress.  HENT:  Head: Normocephalic.  Eyes: Pupils are  equal, round, and reactive to light.  Neck: Normal range of motion.  Cardiovascular: Normal rate and regular rhythm.  Pulmonary/Chest: Effort  normal and breath sounds normal.  Abdominal: Soft. Bowel sounds are normal.  Musculoskeletal: Normal range of motion.  Neurological: He is alert and oriented to person, place, and time.  Skin: Skin is warm and dry.  Psychiatric: He has a normal mood and affect. His behavior is normal. Judgment and thought content normal.    BP 118/71 (BP Location: Left Arm, Patient Position: Sitting, Cuff Size: Normal)   Pulse 87   Temp 98.2 F (36.8 C) (Oral)   Resp 16   Ht 5' 10"  (1.778 m)   Wt 186 lb (84.4 kg)   SpO2 100%   BMI 26.69 kg/m  Wt Readings from Last 3 Encounters:  07/22/19 186 lb (84.4 kg)  04/17/19 189 lb 9.5 oz (86 kg)  04/08/19 189 lb 9.6 oz (86 kg)    Health Maintenance Due  Topic Date Due  . TETANUS/TDAP  04/17/1985    There are no preventive care reminders to display for this patient.   Lab Results  Component Value Date   TSH 3.076 11/03/2015   Lab Results  Component Value Date   WBC 3.9 05/03/2018   HGB 7.7 (L) 05/04/2018   HCT 23.3 (L) 05/03/2018   MCV 86.7 05/03/2018   PLT 137 (L) 05/03/2018   Lab Results  Component Value Date   NA 137 05/04/2018   K 3.8 05/04/2018   CO2 31 05/04/2018   GLUCOSE 89 05/04/2018   BUN 26 (H) 05/04/2018   CREATININE 10.38 (H) 05/04/2018   BILITOT 1.4 (H) 03/12/2018   ALKPHOS 52 03/12/2018   AST 24 03/12/2018   ALT 13 (L) 03/12/2018   PROT 7.5 03/12/2018   ALBUMIN 3.0 (L) 05/04/2018   CALCIUM 8.5 (L) 05/04/2018   ANIONGAP 11 05/04/2018   No results found for: CHOL No results found for: HDL No results found for: LDLCALC No results found for: TRIG No results found for: CHOLHDL No results found for: HGBA1C     Assessment & Plan:   Problem List Items Addressed This Visit      Respiratory   OSA (obstructive sleep apnea) (Chronic)     Genitourinary   End stage renal  disease ([place])     Other   Anemia of chronic disease    Other Visit Diagnoses    Obstructive sleep apnea hypopnea, moderate    -  Primary   Relevant Orders   Split night study   Diabetes mellitus screening         Obstructive sleep apnea hypopnea, moderate Patient was diagnosed with sleep apnea many years ago.  He has an older CPAP and feels as if it is not effective.  Patient has been having daytime sleepiness.  His wife notices periods of apnea.  Patient warrants a repeat sleep study at this time.   - Split night study; Future  Diabetes mellitus screening Hemoglobin A1c is 4.8.  Will reevaluate in 1 year.  Patient has family history of type 2 diabetes mellitus   Anemia of chronic disease Patient has a history of anemia of chronic disease.  He recently had labs evaluated in nephrology 2 weeks ago.  Will review labs as they become available.  End stage renal disease [facility]) Patient is followed closely by nephrology.  He has dialysis on Monday Wednesday and Friday.  He has been tolerating dialysis well.  He recently had fistula placed   Malignant hypertension Antihypertensive medications managed by nephrologist, will defer to nephrology for any potential medication changes.  - Continue medication, monitor  blood pressure at home. Continue DASH diet. Reminder to go to the ER if any CP, SOB, nausea, dizziness, severe HA, changes vision/speech, left arm numbness and tingling and jaw pain.    - Ambulatory referral to Ophthalmology   Hypoglycemia - HgB A1c  Donia Pounds  APRN, MSN, FNP-C [facility] [id number] [place], [place] [address] [phone]   Colonoscopy: 5 Prostate exam: Urologist, return in 1 year.

## 2019-07-23 DIAGNOSIS — N2581 Secondary hyperparathyroidism of renal origin: Secondary | ICD-10-CM | POA: Diagnosis not present

## 2019-07-23 DIAGNOSIS — D631 Anemia in chronic kidney disease: Secondary | ICD-10-CM | POA: Diagnosis not present

## 2019-07-23 DIAGNOSIS — Z23 Encounter for immunization: Secondary | ICD-10-CM | POA: Diagnosis not present

## 2019-07-23 DIAGNOSIS — Z992 Dependence on renal dialysis: Secondary | ICD-10-CM | POA: Diagnosis not present

## 2019-07-23 DIAGNOSIS — N186 End stage renal disease: Secondary | ICD-10-CM | POA: Diagnosis not present

## 2019-07-23 DIAGNOSIS — D509 Iron deficiency anemia, unspecified: Secondary | ICD-10-CM | POA: Diagnosis not present

## 2019-07-26 DIAGNOSIS — N186 End stage renal disease: Secondary | ICD-10-CM | POA: Diagnosis not present

## 2019-07-26 DIAGNOSIS — D631 Anemia in chronic kidney disease: Secondary | ICD-10-CM | POA: Diagnosis not present

## 2019-07-26 DIAGNOSIS — D509 Iron deficiency anemia, unspecified: Secondary | ICD-10-CM | POA: Diagnosis not present

## 2019-07-26 DIAGNOSIS — Z992 Dependence on renal dialysis: Secondary | ICD-10-CM | POA: Diagnosis not present

## 2019-07-26 DIAGNOSIS — N2581 Secondary hyperparathyroidism of renal origin: Secondary | ICD-10-CM | POA: Diagnosis not present

## 2019-07-26 DIAGNOSIS — Z23 Encounter for immunization: Secondary | ICD-10-CM | POA: Diagnosis not present

## 2019-07-28 DIAGNOSIS — D509 Iron deficiency anemia, unspecified: Secondary | ICD-10-CM | POA: Diagnosis not present

## 2019-07-28 DIAGNOSIS — D631 Anemia in chronic kidney disease: Secondary | ICD-10-CM | POA: Diagnosis not present

## 2019-07-28 DIAGNOSIS — N186 End stage renal disease: Secondary | ICD-10-CM | POA: Diagnosis not present

## 2019-07-28 DIAGNOSIS — Z23 Encounter for immunization: Secondary | ICD-10-CM | POA: Diagnosis not present

## 2019-07-28 DIAGNOSIS — Z992 Dependence on renal dialysis: Secondary | ICD-10-CM | POA: Diagnosis not present

## 2019-07-28 DIAGNOSIS — N2581 Secondary hyperparathyroidism of renal origin: Secondary | ICD-10-CM | POA: Diagnosis not present

## 2019-07-30 DIAGNOSIS — D631 Anemia in chronic kidney disease: Secondary | ICD-10-CM | POA: Diagnosis not present

## 2019-07-30 DIAGNOSIS — Z992 Dependence on renal dialysis: Secondary | ICD-10-CM | POA: Diagnosis not present

## 2019-07-30 DIAGNOSIS — N186 End stage renal disease: Secondary | ICD-10-CM | POA: Diagnosis not present

## 2019-07-30 DIAGNOSIS — Z23 Encounter for immunization: Secondary | ICD-10-CM | POA: Diagnosis not present

## 2019-07-30 DIAGNOSIS — D509 Iron deficiency anemia, unspecified: Secondary | ICD-10-CM | POA: Diagnosis not present

## 2019-07-30 DIAGNOSIS — N2581 Secondary hyperparathyroidism of renal origin: Secondary | ICD-10-CM | POA: Diagnosis not present

## 2019-08-01 DIAGNOSIS — Z23 Encounter for immunization: Secondary | ICD-10-CM | POA: Diagnosis not present

## 2019-08-01 DIAGNOSIS — D509 Iron deficiency anemia, unspecified: Secondary | ICD-10-CM | POA: Diagnosis not present

## 2019-08-01 DIAGNOSIS — Z992 Dependence on renal dialysis: Secondary | ICD-10-CM | POA: Diagnosis not present

## 2019-08-01 DIAGNOSIS — D631 Anemia in chronic kidney disease: Secondary | ICD-10-CM | POA: Diagnosis not present

## 2019-08-01 DIAGNOSIS — N2581 Secondary hyperparathyroidism of renal origin: Secondary | ICD-10-CM | POA: Diagnosis not present

## 2019-08-01 DIAGNOSIS — N186 End stage renal disease: Secondary | ICD-10-CM | POA: Diagnosis not present

## 2019-08-04 DIAGNOSIS — Z992 Dependence on renal dialysis: Secondary | ICD-10-CM | POA: Diagnosis not present

## 2019-08-04 DIAGNOSIS — D631 Anemia in chronic kidney disease: Secondary | ICD-10-CM | POA: Diagnosis not present

## 2019-08-04 DIAGNOSIS — N2581 Secondary hyperparathyroidism of renal origin: Secondary | ICD-10-CM | POA: Diagnosis not present

## 2019-08-04 DIAGNOSIS — D509 Iron deficiency anemia, unspecified: Secondary | ICD-10-CM | POA: Diagnosis not present

## 2019-08-04 DIAGNOSIS — N186 End stage renal disease: Secondary | ICD-10-CM | POA: Diagnosis not present

## 2019-08-04 DIAGNOSIS — Z23 Encounter for immunization: Secondary | ICD-10-CM | POA: Diagnosis not present

## 2019-08-08 DIAGNOSIS — N2581 Secondary hyperparathyroidism of renal origin: Secondary | ICD-10-CM | POA: Diagnosis not present

## 2019-08-08 DIAGNOSIS — D631 Anemia in chronic kidney disease: Secondary | ICD-10-CM | POA: Diagnosis not present

## 2019-08-08 DIAGNOSIS — Z23 Encounter for immunization: Secondary | ICD-10-CM | POA: Diagnosis not present

## 2019-08-08 DIAGNOSIS — N186 End stage renal disease: Secondary | ICD-10-CM | POA: Diagnosis not present

## 2019-08-08 DIAGNOSIS — D509 Iron deficiency anemia, unspecified: Secondary | ICD-10-CM | POA: Diagnosis not present

## 2019-08-08 DIAGNOSIS — Z992 Dependence on renal dialysis: Secondary | ICD-10-CM | POA: Diagnosis not present

## 2019-08-11 DIAGNOSIS — D509 Iron deficiency anemia, unspecified: Secondary | ICD-10-CM | POA: Diagnosis not present

## 2019-08-11 DIAGNOSIS — D631 Anemia in chronic kidney disease: Secondary | ICD-10-CM | POA: Diagnosis not present

## 2019-08-11 DIAGNOSIS — Z992 Dependence on renal dialysis: Secondary | ICD-10-CM | POA: Diagnosis not present

## 2019-08-11 DIAGNOSIS — N2581 Secondary hyperparathyroidism of renal origin: Secondary | ICD-10-CM | POA: Diagnosis not present

## 2019-08-11 DIAGNOSIS — N186 End stage renal disease: Secondary | ICD-10-CM | POA: Diagnosis not present

## 2019-08-11 DIAGNOSIS — Z23 Encounter for immunization: Secondary | ICD-10-CM | POA: Diagnosis not present

## 2019-08-13 DIAGNOSIS — Z23 Encounter for immunization: Secondary | ICD-10-CM | POA: Diagnosis not present

## 2019-08-13 DIAGNOSIS — N186 End stage renal disease: Secondary | ICD-10-CM | POA: Diagnosis not present

## 2019-08-13 DIAGNOSIS — D509 Iron deficiency anemia, unspecified: Secondary | ICD-10-CM | POA: Diagnosis not present

## 2019-08-13 DIAGNOSIS — N2581 Secondary hyperparathyroidism of renal origin: Secondary | ICD-10-CM | POA: Diagnosis not present

## 2019-08-13 DIAGNOSIS — D631 Anemia in chronic kidney disease: Secondary | ICD-10-CM | POA: Diagnosis not present

## 2019-08-13 DIAGNOSIS — Z992 Dependence on renal dialysis: Secondary | ICD-10-CM | POA: Diagnosis not present

## 2019-08-15 DIAGNOSIS — D631 Anemia in chronic kidney disease: Secondary | ICD-10-CM | POA: Diagnosis not present

## 2019-08-15 DIAGNOSIS — N186 End stage renal disease: Secondary | ICD-10-CM | POA: Diagnosis not present

## 2019-08-15 DIAGNOSIS — Z23 Encounter for immunization: Secondary | ICD-10-CM | POA: Diagnosis not present

## 2019-08-15 DIAGNOSIS — Z992 Dependence on renal dialysis: Secondary | ICD-10-CM | POA: Diagnosis not present

## 2019-08-15 DIAGNOSIS — N2581 Secondary hyperparathyroidism of renal origin: Secondary | ICD-10-CM | POA: Diagnosis not present

## 2019-08-15 DIAGNOSIS — D509 Iron deficiency anemia, unspecified: Secondary | ICD-10-CM | POA: Diagnosis not present

## 2019-08-16 DIAGNOSIS — T8610 Unspecified complication of kidney transplant: Secondary | ICD-10-CM | POA: Diagnosis not present

## 2019-08-16 DIAGNOSIS — N186 End stage renal disease: Secondary | ICD-10-CM | POA: Diagnosis not present

## 2019-08-16 DIAGNOSIS — Z992 Dependence on renal dialysis: Secondary | ICD-10-CM | POA: Diagnosis not present

## 2019-08-18 DIAGNOSIS — D509 Iron deficiency anemia, unspecified: Secondary | ICD-10-CM | POA: Diagnosis not present

## 2019-08-18 DIAGNOSIS — Z992 Dependence on renal dialysis: Secondary | ICD-10-CM | POA: Diagnosis not present

## 2019-08-18 DIAGNOSIS — N2581 Secondary hyperparathyroidism of renal origin: Secondary | ICD-10-CM | POA: Diagnosis not present

## 2019-08-18 DIAGNOSIS — N186 End stage renal disease: Secondary | ICD-10-CM | POA: Diagnosis not present

## 2019-08-22 DIAGNOSIS — D509 Iron deficiency anemia, unspecified: Secondary | ICD-10-CM | POA: Diagnosis not present

## 2019-08-22 DIAGNOSIS — N2581 Secondary hyperparathyroidism of renal origin: Secondary | ICD-10-CM | POA: Diagnosis not present

## 2019-08-22 DIAGNOSIS — N186 End stage renal disease: Secondary | ICD-10-CM | POA: Diagnosis not present

## 2019-08-22 DIAGNOSIS — Z992 Dependence on renal dialysis: Secondary | ICD-10-CM | POA: Diagnosis not present

## 2019-08-25 DIAGNOSIS — N186 End stage renal disease: Secondary | ICD-10-CM | POA: Diagnosis not present

## 2019-08-25 DIAGNOSIS — D509 Iron deficiency anemia, unspecified: Secondary | ICD-10-CM | POA: Diagnosis not present

## 2019-08-25 DIAGNOSIS — N2581 Secondary hyperparathyroidism of renal origin: Secondary | ICD-10-CM | POA: Diagnosis not present

## 2019-08-25 DIAGNOSIS — Z992 Dependence on renal dialysis: Secondary | ICD-10-CM | POA: Diagnosis not present

## 2019-08-27 DIAGNOSIS — D509 Iron deficiency anemia, unspecified: Secondary | ICD-10-CM | POA: Diagnosis not present

## 2019-08-27 DIAGNOSIS — N186 End stage renal disease: Secondary | ICD-10-CM | POA: Diagnosis not present

## 2019-08-27 DIAGNOSIS — Z992 Dependence on renal dialysis: Secondary | ICD-10-CM | POA: Diagnosis not present

## 2019-08-27 DIAGNOSIS — N2581 Secondary hyperparathyroidism of renal origin: Secondary | ICD-10-CM | POA: Diagnosis not present

## 2019-08-29 DIAGNOSIS — Z992 Dependence on renal dialysis: Secondary | ICD-10-CM | POA: Diagnosis not present

## 2019-08-29 DIAGNOSIS — D509 Iron deficiency anemia, unspecified: Secondary | ICD-10-CM | POA: Diagnosis not present

## 2019-08-29 DIAGNOSIS — N186 End stage renal disease: Secondary | ICD-10-CM | POA: Diagnosis not present

## 2019-08-29 DIAGNOSIS — N2581 Secondary hyperparathyroidism of renal origin: Secondary | ICD-10-CM | POA: Diagnosis not present

## 2019-09-03 DIAGNOSIS — N2581 Secondary hyperparathyroidism of renal origin: Secondary | ICD-10-CM | POA: Diagnosis not present

## 2019-09-03 DIAGNOSIS — Z992 Dependence on renal dialysis: Secondary | ICD-10-CM | POA: Diagnosis not present

## 2019-09-03 DIAGNOSIS — N186 End stage renal disease: Secondary | ICD-10-CM | POA: Diagnosis not present

## 2019-09-03 DIAGNOSIS — D509 Iron deficiency anemia, unspecified: Secondary | ICD-10-CM | POA: Diagnosis not present

## 2019-09-05 DIAGNOSIS — D509 Iron deficiency anemia, unspecified: Secondary | ICD-10-CM | POA: Diagnosis not present

## 2019-09-05 DIAGNOSIS — N186 End stage renal disease: Secondary | ICD-10-CM | POA: Diagnosis not present

## 2019-09-05 DIAGNOSIS — N2581 Secondary hyperparathyroidism of renal origin: Secondary | ICD-10-CM | POA: Diagnosis not present

## 2019-09-05 DIAGNOSIS — Z992 Dependence on renal dialysis: Secondary | ICD-10-CM | POA: Diagnosis not present

## 2019-09-07 DIAGNOSIS — Z992 Dependence on renal dialysis: Secondary | ICD-10-CM | POA: Diagnosis not present

## 2019-09-07 DIAGNOSIS — N186 End stage renal disease: Secondary | ICD-10-CM | POA: Diagnosis not present

## 2019-09-07 DIAGNOSIS — D509 Iron deficiency anemia, unspecified: Secondary | ICD-10-CM | POA: Diagnosis not present

## 2019-09-07 DIAGNOSIS — N2581 Secondary hyperparathyroidism of renal origin: Secondary | ICD-10-CM | POA: Diagnosis not present

## 2019-09-10 DIAGNOSIS — Z992 Dependence on renal dialysis: Secondary | ICD-10-CM | POA: Diagnosis not present

## 2019-09-10 DIAGNOSIS — D509 Iron deficiency anemia, unspecified: Secondary | ICD-10-CM | POA: Diagnosis not present

## 2019-09-10 DIAGNOSIS — N2581 Secondary hyperparathyroidism of renal origin: Secondary | ICD-10-CM | POA: Diagnosis not present

## 2019-09-10 DIAGNOSIS — N186 End stage renal disease: Secondary | ICD-10-CM | POA: Diagnosis not present

## 2019-09-15 DIAGNOSIS — N186 End stage renal disease: Secondary | ICD-10-CM | POA: Diagnosis not present

## 2019-09-15 DIAGNOSIS — N2581 Secondary hyperparathyroidism of renal origin: Secondary | ICD-10-CM | POA: Diagnosis not present

## 2019-09-15 DIAGNOSIS — D509 Iron deficiency anemia, unspecified: Secondary | ICD-10-CM | POA: Diagnosis not present

## 2019-09-15 DIAGNOSIS — Z992 Dependence on renal dialysis: Secondary | ICD-10-CM | POA: Diagnosis not present

## 2019-10-20 ENCOUNTER — Emergency Department (HOSPITAL_COMMUNITY)
Admission: EM | Admit: 2019-10-20 | Discharge: 2019-10-20 | Discharge status: Against Medical Advice | Payer: Medicare Other | Attending: Emergency Medicine | Admitting: Emergency Medicine

## 2019-10-20 ENCOUNTER — Emergency Department (HOSPITAL_COMMUNITY): Payer: Medicare Other

## 2019-10-20 ENCOUNTER — Encounter (HOSPITAL_COMMUNITY): Payer: Self-pay | Admitting: Emergency Medicine

## 2019-10-20 ENCOUNTER — Other Ambulatory Visit: Payer: Self-pay

## 2019-10-20 DIAGNOSIS — Z5321 Procedure and treatment not carried out due to patient leaving prior to being seen by health care provider: Secondary | ICD-10-CM | POA: Diagnosis not present

## 2019-10-20 DIAGNOSIS — R0602 Shortness of breath: Secondary | ICD-10-CM | POA: Diagnosis present

## 2019-10-20 LAB — CBC
HCT: 29.2 % — ABNORMAL LOW (ref 39.0–52.0)
Hemoglobin: 9.3 g/dL — ABNORMAL LOW (ref 13.0–17.0)
MCH: 28.2 pg (ref 26.0–34.0)
MCHC: 31.8 g/dL (ref 30.0–36.0)
MCV: 88.5 fL (ref 80.0–100.0)
Platelets: 156 10*3/uL (ref 150–400)
RBC: 3.3 MIL/uL — ABNORMAL LOW (ref 4.22–5.81)
RDW: 20.9 % — ABNORMAL HIGH (ref 11.5–15.5)
WBC: 4.7 10*3/uL (ref 4.0–10.5)
nRBC: 0 % (ref 0.0–0.2)

## 2019-10-20 LAB — BASIC METABOLIC PANEL
Anion gap: 16 — ABNORMAL HIGH (ref 5–15)
BUN: 29 mg/dL — ABNORMAL HIGH (ref 6–20)
CO2: 29 mmol/L (ref 22–32)
Calcium: 9.7 mg/dL (ref 8.9–10.3)
Chloride: 94 mmol/L — ABNORMAL LOW (ref 98–111)
Creatinine, Ser: 16.52 mg/dL — ABNORMAL HIGH (ref 0.61–1.24)
GFR calc Af Amer: 3 mL/min — ABNORMAL LOW (ref >60)
GFR calc non Af Amer: 3 mL/min — ABNORMAL LOW (ref >60)
Glucose, Bld: 103 mg/dL — ABNORMAL HIGH (ref 70–99)
Potassium: 4 mmol/L (ref 3.5–5.1)
Sodium: 139 mmol/L (ref 135–145)

## 2019-10-20 LAB — TROPONIN I (HIGH SENSITIVITY): Troponin I (High Sensitivity): 19 ng/L — ABNORMAL HIGH (ref <18)

## 2019-10-20 MED ORDER — SODIUM CHLORIDE 0.9% FLUSH: 3.0000 mL | Freq: Once | INTRAVENOUS | Status: DC

## 2019-10-20 NOTE — ED Triage Notes (Signed)
Per EMS, pt from home, C/O SOB and chest pressure.  Pt has dialysis on M,W, F however did not go this past Friday due to holiday.  EMS reports clear upper lung sounds, lower lungs are diminished, no edema, dry cough.    240/140 HR 90 RR 24  98% RA

## 2019-10-20 NOTE — ED Notes (Signed)
Called for pt x3 for blood work. No answer.

## 2019-10-22 ENCOUNTER — Ambulatory Visit: Payer: Self-pay

## 2019-10-22 NOTE — Telephone Encounter (Signed)
Wife reports pt. Went to ED 10/20/19 with shortness of breath He is still having shortness of breath. Has dialysis today and "the dialysis doctor wasn't there. We were going to have her check him out." Reports PCP office can't see him for a couple of weeks." Sleeping propped on pillows.Instructed to call EMS for transport to ED for evaluation. Verbalizes understanding.  Reason for Disposition . Sounds like a life-threatening emergency to the triager  Answer Assessment - Initial Assessment Questions 1. RESPIRATORY STATUS: "Describe your breathing?" (e.g., wheezing, shortness of breath, unable to speak, severe coughing)      Shortness of breath 2. ONSET: "When did this breathing problem begin?"      Started last week 3. PATTERN "Does the difficult breathing come and go, or has it been constant since it started?"      Constant 4. SEVERITY: "How bad is your breathing?" (e.g., mild, moderate, severe)    - MILD: No SOB at rest, mild SOB with walking, speaks normally in sentences, can lay down, no retractions, pulse < 100.    - MODERATE: SOB at rest, SOB with minimal exertion and prefers to sit, cannot lie down flat, speaks in phrases, mild retractions, audible wheezing, pulse 100-120.    - SEVERE: Very SOB at rest, speaks in single words, struggling to breathe, sitting hunched forward, retractions, pulse > 120      Mild- moderate 5. RECURRENT SYMPTOM: "Have you had difficulty breathing before?" If so, ask: "When was the last time?" and "What happened that time?"      Yes 6. CARDIAC HISTORY: "Do you have any history of heart disease?" (e.g., heart attack, angina, bypass surgery, angioplasty)      Yes 7. LUNG HISTORY: "Do you have any history of lung disease?"  (e.g., pulmonary embolus, asthma, emphysema)     No 8. CAUSE: "What do you think is causing the breathing problem?"      Unsure 9. OTHER SYMPTOMS: "Do you have any other symptoms? (e.g., dizziness, runny nose, cough, chest pain, fever)      No 10. PREGNANCY: "Is there any chance you are pregnant?" "When was your last menstrual period?"       n/a 11. TRAVEL: "Have you traveled out of the country in the last month?" (e.g., travel history, exposures)       No  Protocols used: BREATHING DIFFICULTY-A-AH

## 2019-11-10 ENCOUNTER — Telehealth: Payer: Medicare Other | Admitting: Emergency Medicine

## 2019-11-10 ENCOUNTER — Other Ambulatory Visit: Payer: Self-pay

## 2019-11-11 ENCOUNTER — Telehealth (INDEPENDENT_AMBULATORY_CARE_PROVIDER_SITE_OTHER): Payer: Medicare Other | Admitting: Emergency Medicine

## 2019-11-11 ENCOUNTER — Encounter: Payer: Self-pay | Admitting: Emergency Medicine

## 2019-11-11 ENCOUNTER — Telehealth: Payer: Medicare Other | Admitting: Emergency Medicine

## 2019-11-11 VITALS — BP 171/94

## 2019-11-11 DIAGNOSIS — N186 End stage renal disease: Secondary | ICD-10-CM | POA: Diagnosis not present

## 2019-11-11 DIAGNOSIS — R05 Cough: Secondary | ICD-10-CM

## 2019-11-11 DIAGNOSIS — R059 Cough, unspecified: Secondary | ICD-10-CM

## 2019-11-11 NOTE — Progress Notes (Signed)
Telemedicine Encounter- SOAP NOTE Established Patient  This telephone encounter was conducted with the patient's (or proxy's) verbal consent via audio telecommunications: yes/no: Yes Patient was instructed to have this encounter in a suitably private space; and to only have persons present to whom they give permission to participate. In addition, patient identity was confirmed by use of name plus two identifiers (DOB and address).  I discussed the limitations, risks, security and privacy concerns of performing an evaluation and management service by telephone and the availability of in person appointments. I also discussed with the patient that there may be a patient responsible charge related to this service. The patient expressed understanding and agreed to proceed.  I spent a total of TIME; 0 MIN TO 60 MIN: 15 minutes talking with the patient or their proxy.  Chief Complaint  Patient presents with  . Cough    coughing fit and sob thats been going on for 1 1/2 month    Subjective   Jonathon Conley is a 54 y.o. male established patient. Telephone visit today complaining of occasional coughing fits.  Twice today.  Clear phlegm.  No fever or flulike symptoms.  Better today after he was dialyzed.  Patient has history of end-stage renal disease.  No other complaints or medical concerns today.  HPI   Patient Active Problem List   Diagnosis Date Noted  . Ulcerative pancolitis with rectal bleeding ([place]) 07/22/2019  . Acute respiratory distress 05/02/2018  . Chest pain 05/02/2018  . Acute CHF (congestive heart failure) ([place]) 04/09/2018  . FUO (fever of unknown origin) 03/13/2018  . Viral respiratory infection 03/05/2018  . Cough 11/14/2017  . Lower respiratory infection 11/14/2017  . History of fever 11/14/2017  . SIRS (systemic inflammatory response syndrome) ([place]) 12/19/2016  . End stage renal disease ([place]) 12/19/2016  . Hyponatremia 12/19/2016  . Anemia of chronic disease  12/19/2016  . Postprocedural seroma of a musculoskeletal structure following other procedure 12/17/2016  . Hematochezia 04/08/2016  . HTN (hypertension) 04/08/2016  . Intravenous line infection ([place]) 04/08/2016  . Insomnia 01/04/2016  . Malignant hypertension 12/14/2015  . Symptomatic anemia 11/03/2015  . Chronic anemia 11/03/2015  . Thrombocytopenia ([place]) 11/03/2015  . Pleural effusion on right 11/03/2015  . Shortness of breath 11/02/2015  . Sepsis ([place]) 09/23/2015  . ESRD on dialysis ([place]) 09/22/2015  . Accelerated hypertension 09/22/2015  . Leg edema, left   . HCAP (healthcare-associated pneumonia) 09/21/2015  . Bacteremia due to Gram-positive bacteria 08/20/2015  . Acute blood loss anemia 08/12/2015  . C. difficile colitis 08/12/2015  . Bleeding gastrointestinal 08/12/2015  . Acute-on-chronic renal failure ([place]) 08/10/2015  . H/O kidney transplant 08/08/2015  . Non compliance with medical treatment 08/08/2015  . History of anemia 08/08/2015  . AKI (acute kidney injury) ([place]) 02/14/2015  . Renal transplant recipient 02/14/2015  . Acute on chronic diastolic congestive heart failure ([place]) 02/14/2015  . Anemia 02/14/2015  . Hyperlipidemia   . OSA (obstructive sleep apnea) 03/27/2013    Past Medical History:  Diagnosis Date  . Anemia   . Asthma    childhood  . Blood transfusion without reported diagnosis   . CHF (congestive heart failure) ([place])   . Chronic kidney disease    Dialysis Mon, Wed, Fri  . Colitis   . COPD (chronic obstructive pulmonary disease) ([place])   . Diverticulosis 04/12/16   also seen: sigmoid and rectal erythema, path:   Marland Kitchen Dyspnea   . Heart murmur   . Hemorrhoids 03/2016  bleeding at 04/12/16 colonoscopy, treated with  APC laser. cauterization  . Hyperlipidemia   . Hypertension   . OSA (obstructive sleep apnea) 03/27/2013    Current Outpatient Medications  Medication Sig Dispense Refill  . aspirin EC 81 MG tablet Take 81 mg by mouth daily.     .  calcitRIOL (ROCALTROL) 0.5 MCG capsule Take 1 capsule (0.5 mcg total) by mouth Every Tuesday,Thursday,and Saturday with dialysis. 15 capsule 0  . calcium acetate (PHOSLO) 667 MG capsule Take 667 mg by mouth 3 (three) times daily with meals.    . carvedilol (COREG) 25 MG tablet Take 25 mg by mouth 2 (two) times daily.   0  . hydrALAZINE (APRESOLINE) 25 MG tablet Take 1 tablet (25 mg total) by mouth 3 (three) times daily. 90 tablet 0  . losartan (COZAAR) 100 MG tablet Take 1 tablet (100 mg total) by mouth daily. 30 tablet 0  . sevelamer carbonate (RENVELA) 800 MG tablet Take 1,600-2,400 mg by mouth 5 (five) times daily.    . simvastatin (ZOCOR) 5 MG tablet Take 5 mg by mouth Nightly.     No current facility-administered medications for this visit.    Allergies  Allergen Reactions  . Lisinopril Swelling    Facial swelling    Social History   Socioeconomic History  . Marital status: Married    Spouse name: Not on file  . Number of children: Not on file  . Years of education: Not on file  . Highest education level: Not on file  Occupational History  . Not on file  Tobacco Use  . Smoking status: Never Smoker  . Smokeless tobacco: Never Used  Substance and Sexual Activity  . Alcohol use: No  . Drug use: No  . Sexual activity: Not on file  Other Topics Concern  . Not on file  Social History Narrative  . Not on file   Social Determinants of Health   Financial Resource Strain:   . Difficulty of Paying Living Expenses: Not on file  Food Insecurity:   . Worried About Charity fundraiser in the Last Year: Not on file  . Ran Out of Food in the Last Year: Not on file  Transportation Needs:   . Lack of Transportation (Medical): Not on file  . Lack of Transportation (Non-Medical): Not on file  Physical Activity:   . Days of Exercise per Week: Not on file  . Minutes of Exercise per Session: Not on file  Stress:   . Feeling of Stress : Not on file  Social Connections:   .  Frequency of Communication with Friends and Family: Not on file  . Frequency of Social Gatherings with Friends and Family: Not on file  . Attends Religious Services: Not on file  . Active Member of Clubs or Organizations: Not on file  . Attends Archivist Meetings: Not on file  . Marital Status: Not on file  Intimate Partner Violence:   . Fear of Current or Ex-Partner: Not on file  . Emotionally Abused: Not on file  . Physically Abused: Not on file  . Sexually Abused: Not on file    Review of Systems  Constitutional: Negative.  Negative for chills and fever.  HENT: Negative.  Negative for congestion and sore throat.   Respiratory: Positive for cough. Negative for hemoptysis, sputum production, shortness of breath and wheezing.   Cardiovascular: Negative.  Negative for chest pain and palpitations.  Gastrointestinal: Negative.  Negative for abdominal pain, blood  in stool, diarrhea, melena, nausea and vomiting.  Genitourinary: Negative.  Negative for dysuria and hematuria.  Musculoskeletal: Negative for myalgias and neck pain.  Skin: Negative.  Negative for rash.  Neurological: Negative.  Negative for dizziness and headaches.  All other systems reviewed and are negative.   Objective  Alert and oriented x3 in no apparent respiratory distress. Vitals as reported by the patient: Today's Vitals   11/11/19 1458  BP: (!) 171/94    There are no diagnoses linked to this encounter.  Pamela was seen today for cough.  Diagnoses and all orders for this visit:  Cough  End stage renal disease ([place])     Clinically stable.  No medical concerns identified during this visit.  Advised to contact the office if clinical picture changes or he gets worse in the next several days.  Otherwise follow-up with PCP. I discussed the assessment and treatment plan with the patient. The patient was provided an opportunity to ask questions and all were answered. The patient agreed with the plan  and demonstrated an understanding of the instructions.   The patient was advised to call back or seek an in-person evaluation if the symptoms worsen or if the condition fails to improve as anticipated.  I provided 15 minutes of non-face-to-face time during this encounter.  Horald Pollen, MD  Primary Care at [facility]

## 2019-11-18 ENCOUNTER — Ambulatory Visit (INDEPENDENT_AMBULATORY_CARE_PROVIDER_SITE_OTHER): Payer: Medicare Other | Admitting: Nurse Practitioner

## 2019-11-18 ENCOUNTER — Other Ambulatory Visit: Payer: Self-pay

## 2019-11-18 ENCOUNTER — Encounter (INDEPENDENT_AMBULATORY_CARE_PROVIDER_SITE_OTHER): Payer: Self-pay | Admitting: Nurse Practitioner

## 2019-11-18 ENCOUNTER — Telehealth: Payer: Self-pay | Admitting: *Deleted

## 2019-11-18 VITALS — BP 185/101 | HR 75 | Resp 17 | Ht 71.0 in | Wt 186.0 lb

## 2019-11-18 DIAGNOSIS — R0602 Shortness of breath: Secondary | ICD-10-CM

## 2019-11-18 DIAGNOSIS — I10 Essential (primary) hypertension: Secondary | ICD-10-CM

## 2019-11-18 DIAGNOSIS — R600 Localized edema: Secondary | ICD-10-CM

## 2019-11-18 DIAGNOSIS — N186 End stage renal disease: Secondary | ICD-10-CM | POA: Diagnosis not present

## 2019-11-18 NOTE — Telephone Encounter (Signed)
Left message,re: new patient appointment.

## 2019-11-18 NOTE — Progress Notes (Signed)
SUBJECTIVE:  Patient ID: Jonathon Conley, male    DOB: [birth year], 54 y.o.   MRN: [id number] Chief Complaint  Patient presents with   Follow-up    facial swelling and arm is cold on right side    HPI  Jonathon Conley is a 54 y.o. male presents today with complaints of facial edema, coldness on his right arm in addition to some shortness of breath.  The patient states that most of the symptoms have been ongoing for the last 2 months.  The patient states that his facial swelling is not something that is consistent.  It also only occurs on the right side of his face.  It seems to be connected with his blood pressure medications.  The patient states that after he takes his blood pressure medication he will have the facial swelling and then after about 8 hours or so it goes down.  Patient currently has right upper extremity hero graft.  In regards to the patient's cold arm it tends to happen during dialysis however when the patient is not on dialysis his arm is warm and he and this is also not consistent.  The patient shortness of breath he describes as something that comes and goes and he does not necessarily find himself being acutely short of breath but just at times he notices he is unable to catch his breath.  The patient has had a number of chest x-rays that did not show pleural effusion per the patient.  The patient has had pleural effusions in the past that required drainage.  He denies any fever, chills, nausea, vomiting or diarrhea.  Patient also does note that with his facial swelling sometimes his lips swell as well.  Past Medical History:  Diagnosis Date   Anemia    Asthma    childhood   Blood transfusion without reported diagnosis    CHF (congestive heart failure) ([place])    Chronic kidney disease    Dialysis Mon, Wed, Fri   Colitis    COPD (chronic obstructive pulmonary disease) ([place])    Diverticulosis 04/12/16   also seen: sigmoid and rectal erythema, path:    Dyspnea     Heart murmur    Hemorrhoids 03/2016   bleeding at 04/12/16 colonoscopy, treated with  APC laser. cauterization   Hyperlipidemia    Hypertension    OSA (obstructive sleep apnea) 03/27/2013    Past Surgical History:  Procedure Laterality Date   A/V SHUNTOGRAM Right 04/17/2019   Procedure: A/V SHUNTOGRAM;  Surgeon: Algernon Huxley, MD;  Location: [facility];  Service: Cardiovascular;  Laterality: Right;   APPLICATION OF WOUND VAC Right 12/13/2016   Procedure: APPLICATION OF WOUND VAC;  Surgeon: Algernon Huxley, MD;  Location: [facility];  Service: Vascular;  Laterality: Right;   APPLICATION OF WOUND VAC Right 12/19/2016   Procedure: APPLICATION OF WOUND VAC;  Surgeon: Algernon Huxley, MD;  Location: [facility];  Service: Vascular;  Laterality: Right;   COLONOSCOPY N/A 04/12/2016   Procedure: COLONOSCOPY;  Surgeon: Mauri Pole, MD;  Location: [facility];  Service: Endoscopy;  Laterality: N/A;   DIALYSIS/PERMA CATHETER INSERTION N/A 02/26/2017   Procedure: Dialysis/Perma Catheter Insertion;  Surgeon: Algernon Huxley, MD;  Location: [facility];  Service: Cardiovascular;  Laterality: N/A;   DIALYSIS/PERMA CATHETER INSERTION N/A 03/26/2018   Procedure: DIALYSIS/PERMA CATHETER INSERTION;  Surgeon: Katha Cabal, MD;  Location: [facility];  Service: Cardiovascular;  Laterality: N/A;  DIALYSIS/PERMA CATHETER REMOVAL N/A 01/16/2019   Procedure: DIALYSIS/PERMA CATHETER REMOVAL;  Surgeon: Algernon Huxley, MD;  Location: [facility];  Service: Cardiovascular;  Laterality: N/A;   EXCHANGE OF A DIALYSIS CATHETER  11/20/2016   Procedure: Exchange Of A Dialysis Catheter;  Surgeon: Algernon Huxley, MD;  Location: [facility];  Service: Cardiovascular;;   fistulagrams Right    HEMATOMA EVACUATION Right 12/13/2016   Procedure: EVACUATION HEMATOMA;  Surgeon: Algernon Huxley, MD;  Location: [facility];  Service: Vascular;  Laterality: Right;   HEMATOMA EVACUATION Right 12/19/2016    Procedure: EVACUATIONof seroma;  Surgeon: Algernon Huxley, MD;  Location: [facility];  Service: Vascular;  Laterality: Right;   HOT HEMOSTASIS N/A 04/12/2016   Procedure: HOT HEMOSTASIS (ARGON PLASMA COAGULATION/BICAP);  Surgeon: Mauri Pole, MD;  Location: [facility];  Service: Endoscopy;  Laterality: N/A;   KIDNEY TRANSPLANT     LIGATIONS OF HERO GRAFT Right 04/25/2018   Procedure: LIGATIONS OF HERO GRAFT ( REVISION );  Surgeon: Algernon Huxley, MD;  Location: [facility];  Service: Vascular;  Laterality: Right;   PERIPHERAL VASCULAR CATHETERIZATION Bilateral 10/02/2016   Procedure: Upper Extremity Venography;  Surgeon: Algernon Huxley, MD;  Location: [facility];  Service: Cardiovascular;  Laterality: Bilateral;   PERIPHERAL VASCULAR CATHETERIZATION Right 11/08/2016   Procedure: DIALYSIS/PERMA CATHETER REMOVAL right Jugular;  Surgeon: Algernon Huxley, MD;  Location: [facility];  Service: Vascular;  Laterality: Right;   PERIPHERAL VASCULAR CATHETERIZATION Left 11/08/2016   Procedure: DIALYSIS/PERMA CATHETER INSERTION left jugular with u/s guide and flouroscan;  Surgeon: Algernon Huxley, MD;  Location: [facility];  Service: Vascular;  Laterality: Left;   PERIPHERAL VASCULAR CATHETERIZATION N/A 11/08/2016   Procedure: IVC FILTER INSERTION;  Surgeon: Algernon Huxley, MD;  Location: [facility];  Service: Vascular;  Laterality: N/A;   perm a cath in Rt upper chest Right    THORACENTESIS     UPPER EXTREMITY ANGIOGRAPHY Right 11/28/2018   Procedure: UPPER EXTREMITY ANGIOGRAPHY;  Surgeon: Algernon Huxley, MD;  Location: [facility];  Service: Cardiovascular;  Laterality: Right;   VASCULAR ACCESS DEVICE INSERTION Right 11/08/2016   Procedure: INSERTION OF HERO VASCULAR ACCESS DEVICE ( GRAFT );  Surgeon: Algernon Huxley, MD;  Location: [facility];  Service: Vascular;  Laterality: Right;    Social History   Socioeconomic History   Marital status: Married    Spouse name: Not on file   Number of children: Not  on file   Years of education: Not on file   Highest education level: Not on file  Occupational History   Not on file  Tobacco Use   Smoking status: Never Smoker   Smokeless tobacco: Never Used  Substance and Sexual Activity   Alcohol use: No   Drug use: No   Sexual activity: Not on file  Other Topics Concern   Not on file  Social History Narrative   Not on file   Social Determinants of Health   Financial Resource Strain:    Difficulty of Paying Living Expenses: Not on file  Food Insecurity:    Worried About [place] in the Last Year: Not on file   Ran Out of Food in the Last Year: Not on file  Transportation Needs:    Lack of Transportation (Medical): Not on file   Lack of Transportation (Non-Medical): Not on file  Physical Activity:    Days of Exercise per Week: Not on  file   Minutes of Exercise per Session: Not on file  Stress:    Feeling of Stress : Not on file  Social Connections:    Frequency of Communication with Friends and Family: Not on file   Frequency of Social Gatherings with Friends and Family: Not on file   Attends Religious Services: Not on file   Active Member of Clubs or Organizations: Not on file   Attends Archivist Meetings: Not on file   Marital Status: Not on file  Intimate Partner Violence:    Fear of Current or Ex-Partner: Not on file   Emotionally Abused: Not on file   Physically Abused: Not on file   Sexually Abused: Not on file    Family History  Problem Relation Age of Onset   Heart disease Mother    Cancer Mother        ovarian   Cancer Maternal Grandmother    Cancer Maternal Grandfather    Asthma Son    Asthma Son     Allergies  Allergen Reactions   Lisinopril Swelling    Facial swelling     Review of Systems   Review of Systems: Negative Unless Checked Constitutional: [] Weight loss  [] Fever  [] Chills Cardiac: [] Chest pain   []  Atrial Fibrillation  [] Palpitations    [] Shortness of breath when laying flat   [] Shortness of breath with exertion. [] Shortness of breath at rest Vascular:  [] Pain in legs with walking   [] Pain in legs with standing [] Pain in legs when laying flat   [] Claudication    [] Pain in feet when laying flat    [] History of DVT   [] Phlebitis   [] Swelling in legs   [] Varicose veins   [] Non-healing ulcers Pulmonary:   [] Uses home oxygen   [] Productive cough   [] Hemoptysis   [] Wheeze  [] COPD   [] Asthma Neurologic:  [] Dizziness   [] Seizures  [] Blackouts [] History of stroke   [] History of TIA  [] Aphasia   [] Temporary Blindness   [] Weakness or numbness in arm   [] Weakness or numbness in leg Musculoskeletal:   [] Joint swelling   [] Joint pain   [] Low back pain  []  History of Knee Replacement [] Arthritis [] back Surgeries  []  Spinal Stenosis    Hematologic:  [] Easy bruising  [] Easy bleeding   [] Hypercoagulable state   [x] Anemic Gastrointestinal:  [] Diarrhea   [] Vomiting  [] Gastroesophageal reflux/heartburn   [] Difficulty swallowing. [] Abdominal pain Genitourinary:  [x] Chronic kidney disease   [] Difficult urination  [] Anuric   [] Blood in urine [] Frequent urination  [] Burning with urination   [] Hematuria Skin:  [] Rashes   [] Ulcers [] Wounds Psychological:  [] History of anxiety   []  History of major depression  []  Memory Difficulties      OBJECTIVE:   Physical Exam  BP (!) 185/101 (BP Location: Left Arm)    Pulse 75    Resp 17    Ht 5' 11"  (1.803 m)    Wt 186 lb (84.4 kg)    BMI 25.94 kg/m   Gen: WD/WN, NAD Head: [place]/AT, No temporalis wasting.  Ear/Nose/Throat: Hearing grossly intact, nares w/o erythema or drainage Eyes: PER, EOMI, sclera nonicteric.  Neck: Supple, no masses.  No JVD.  Pulmonary:  Good air movement, no use of accessory muscles.  Cardiac: RRR Vascular:   good thrill and bruit Vessel Right Left  Radial Palpable Palpable   Gastrointestinal: soft, non-distended. No guarding/no peritoneal signs.  Musculoskeletal: M/S 5/5 throughout.   No deformity or atrophy.  Neurologic: Pain and light touch intact  in extremities.  Symmetrical.  Speech is fluent. Motor exam as listed above. Psychiatric: Judgment intact, Mood & affect appropriate for pt's clinical situation. Dermatologic: No Venous rashes. No Ulcers Noted.  No changes consistent with cellulitis. Lymph : No Cervical lymphadenopathy, no lichenification or skin changes of chronic lymphedema.       ASSESSMENT AND PLAN:  1. Accelerated hypertension Based upon the patient's description of symptoms, the facial swelling may be some component of angioedema.  However the patient has had issues with blood pressure control and in addition to his end-stage renal disease status, titration and adjustment of his blood pressure medications may require more expert attention.  Will refer the patient to cardiology to see if changing blood pressure medications may help with the facial swelling. - Ambulatory referral to Cardiology  2. Shortness of breath Patient has complaints of shortness of breath.  He also has a history of congestive heart failure.  Per the patient he did not have a cardiologist that he sees regularly.  In addition to the above problem with hypertension mention, it is felt that an assessment by cardiology for possible heart failure exacerbations may be prudent to evaluate for possible causes of your shortness of breath. - Ambulatory referral to Cardiology  3. Facial edema Given the fact that the patient has a hero graft assessment for superior vena cava syndrome is prudent.  However, typically superior vena cava syndrome is not something that comes and goes.  Also it is not usually confined to one side of the face, and in addition it typically does not cause shortness of breath.  However, to fully rule out superior vena cava syndrome we will obtain a venogram in order to ensure that there are no areas of stenosis that may need repair.  I have discussed the procedure with the  patient including risk, benefits and alternatives.  We also discussed possible next steps depending on what is found on the venogram.  Patient agrees to proceed.  4. End stage renal disease ([place]) The patient's symptoms of coldness in his upper arm could be consistent with steal syndrome however the patient does have a good radial pulse.  Generally in instances with patients with steal syndrome and hero graft there is usually intense pain and it is cold and painful when on dialysis or not.  The patient's follow-up visit we will obtain an HDA in order to assess for steal syndrome.   Current Outpatient Medications on File Prior to Visit  Medication Sig Dispense Refill   aspirin EC 81 MG tablet Take 81 mg by mouth daily.      calcitRIOL (ROCALTROL) 0.5 MCG capsule Take 1 capsule (0.5 mcg total) by mouth Every Tuesday,Thursday,and Saturday with dialysis. 15 capsule 0   calcium acetate (PHOSLO) 667 MG capsule Take 667 mg by mouth 3 (three) times daily with meals.     carvedilol (COREG) 25 MG tablet Take 25 mg by mouth 2 (two) times daily.   0   cloNIDine (CATAPRES - DOSED IN MG/24 HR) 0.1 mg/24hr patch Take by mouth.     hydrALAZINE (APRESOLINE) 25 MG tablet Take 1 tablet (25 mg total) by mouth 3 (three) times daily. 90 tablet 0   lactulose (CHRONULAC) 10 GM/15ML solution Take by mouth.     losartan (COZAAR) 100 MG tablet Take 1 tablet (100 mg total) by mouth daily. 30 tablet 0   sevelamer carbonate (RENVELA) 800 MG tablet Take 1,600-2,400 mg by mouth 5 (five) times daily.  simvastatin (ZOCOR) 5 MG tablet Take 5 mg by mouth Nightly.     No current facility-administered medications on file prior to visit.    There are no Patient Instructions on file for this visit. No follow-ups on file.   Kris Hartmann, NP  This note was completed with Sales executive.  Any errors are purely unintentional.

## 2019-11-19 ENCOUNTER — Telehealth (INDEPENDENT_AMBULATORY_CARE_PROVIDER_SITE_OTHER): Payer: Self-pay

## 2019-11-19 ENCOUNTER — Encounter (INDEPENDENT_AMBULATORY_CARE_PROVIDER_SITE_OTHER): Payer: Self-pay

## 2019-11-19 NOTE — Telephone Encounter (Signed)
Spoke with Misty at [facility] and the patient is scheduled to have bilateral venograms with Dr. Lucky Cowboy on 11/27/19 with a 10:15 am a the MM. Patient will do covid testing on 11/25/19 between 12:30-2:30 pm at the [facility]>

## 2019-11-25 ENCOUNTER — Other Ambulatory Visit: Payer: Self-pay

## 2019-11-25 ENCOUNTER — Other Ambulatory Visit
Admission: RE | Admit: 2019-11-25 | Discharge: 2019-11-25 | Disposition: A | Discharge status: Home / Self Care | Payer: Medicare Other | Source: Ambulatory Visit | Attending: Vascular Surgery | Admitting: Vascular Surgery

## 2019-11-25 DIAGNOSIS — Z01812 Encounter for preprocedural laboratory examination: Secondary | ICD-10-CM | POA: Diagnosis present

## 2019-11-25 DIAGNOSIS — Z20822 Contact with and (suspected) exposure to covid-19: Secondary | ICD-10-CM | POA: Insufficient documentation

## 2019-11-25 LAB — SARS CORONAVIRUS 2 (TAT 6-24 HRS): SARS Coronavirus 2: NEGATIVE

## 2019-11-26 ENCOUNTER — Other Ambulatory Visit (INDEPENDENT_AMBULATORY_CARE_PROVIDER_SITE_OTHER): Payer: Self-pay | Admitting: Nurse Practitioner

## 2019-11-27 ENCOUNTER — Encounter: Payer: Self-pay | Admitting: Vascular Surgery

## 2019-11-27 ENCOUNTER — Ambulatory Visit
Admission: RE | Admit: 2019-11-27 | Discharge: 2019-11-27 | Disposition: A | Discharge status: Home / Self Care | Payer: Medicare Other | Attending: Vascular Surgery | Admitting: Vascular Surgery

## 2019-11-27 ENCOUNTER — Other Ambulatory Visit: Payer: Self-pay

## 2019-11-27 ENCOUNTER — Encounter
Admission: RE | Disposition: A | Discharge status: Home / Self Care | Payer: Self-pay | Source: Home / Self Care | Attending: Vascular Surgery

## 2019-11-27 DIAGNOSIS — Z7982 Long term (current) use of aspirin: Secondary | ICD-10-CM | POA: Diagnosis not present

## 2019-11-27 DIAGNOSIS — I132 Hypertensive heart and chronic kidney disease with heart failure and with stage 5 chronic kidney disease, or end stage renal disease: Secondary | ICD-10-CM | POA: Insufficient documentation

## 2019-11-27 DIAGNOSIS — T82898A Other specified complication of vascular prosthetic devices, implants and grafts, initial encounter: Secondary | ICD-10-CM | POA: Diagnosis not present

## 2019-11-27 DIAGNOSIS — Z79899 Other long term (current) drug therapy: Secondary | ICD-10-CM | POA: Insufficient documentation

## 2019-11-27 DIAGNOSIS — J449 Chronic obstructive pulmonary disease, unspecified: Secondary | ICD-10-CM | POA: Insufficient documentation

## 2019-11-27 DIAGNOSIS — R0602 Shortness of breath: Secondary | ICD-10-CM | POA: Diagnosis not present

## 2019-11-27 DIAGNOSIS — Y841 Kidney dialysis as the cause of abnormal reaction of the patient, or of later complication, without mention of misadventure at the time of the procedure: Secondary | ICD-10-CM | POA: Diagnosis not present

## 2019-11-27 DIAGNOSIS — R609 Edema, unspecified: Secondary | ICD-10-CM | POA: Insufficient documentation

## 2019-11-27 DIAGNOSIS — N186 End stage renal disease: Secondary | ICD-10-CM | POA: Diagnosis not present

## 2019-11-27 DIAGNOSIS — G4733 Obstructive sleep apnea (adult) (pediatric): Secondary | ICD-10-CM | POA: Diagnosis not present

## 2019-11-27 DIAGNOSIS — T82858A Stenosis of vascular prosthetic devices, implants and grafts, initial encounter: Secondary | ICD-10-CM | POA: Insufficient documentation

## 2019-11-27 DIAGNOSIS — Z992 Dependence on renal dialysis: Secondary | ICD-10-CM

## 2019-11-27 DIAGNOSIS — E785 Hyperlipidemia, unspecified: Secondary | ICD-10-CM | POA: Insufficient documentation

## 2019-11-27 DIAGNOSIS — I509 Heart failure, unspecified: Secondary | ICD-10-CM | POA: Insufficient documentation

## 2019-11-27 HISTORY — PX: UPPER EXTREMITY VENOGRAPHY: CATH118272

## 2019-11-27 LAB — POTASSIUM (ARMC VASCULAR LAB ONLY): Potassium (ARMC vascular lab): 4 (ref 3.5–5.1)

## 2019-11-27 SURGERY — UPPER EXTREMITY VENOGRAPHY
Anesthesia: Moderate Sedation | Laterality: Bilateral

## 2019-11-27 MED ORDER — MIDAZOLAM HCL 2 MG/2ML IJ SOLN
INTRAMUSCULAR | Status: DC | PRN
  Administered 2019-11-27: 2 mg via INTRAVENOUS

## 2019-11-27 MED ORDER — HEPARIN SODIUM (PORCINE) 1000 UNIT/ML IJ SOLN
INTRAMUSCULAR | Status: AC
  Filled 2019-11-27: qty 1

## 2019-11-27 MED ORDER — CEFAZOLIN SODIUM-DEXTROSE 1-4 GM/50ML-% IV SOLN: 1.0000 g | Freq: Once | INTRAVENOUS | Status: AC

## 2019-11-27 MED ORDER — FENTANYL CITRATE (PF) 100 MCG/2ML IJ SOLN
INTRAMUSCULAR | Status: AC
  Filled 2019-11-27: qty 2

## 2019-11-27 MED ORDER — MIDAZOLAM HCL 2 MG/ML PO SYRP: 8.0000 mg | ORAL_SOLUTION | Freq: Once | ORAL | Status: DC | PRN

## 2019-11-27 MED ORDER — FENTANYL CITRATE (PF) 100 MCG/2ML IJ SOLN
INTRAMUSCULAR | Status: DC | PRN
  Administered 2019-11-27: 50 ug via INTRAVENOUS

## 2019-11-27 MED ORDER — METHYLPREDNISOLONE SODIUM SUCC 125 MG IJ SOLR: 125.0000 mg | Freq: Once | INTRAMUSCULAR | Status: DC | PRN

## 2019-11-27 MED ORDER — DIPHENHYDRAMINE HCL 50 MG/ML IJ SOLN: 50.0000 mg | Freq: Once | INTRAMUSCULAR | Status: DC | PRN

## 2019-11-27 MED ORDER — ONDANSETRON HCL 4 MG/2ML IJ SOLN: 4.0000 mg | Freq: Four times a day (QID) | INTRAMUSCULAR | Status: DC | PRN

## 2019-11-27 MED ORDER — HYDROMORPHONE HCL 1 MG/ML IJ SOLN: 1.0000 mg | Freq: Once | INTRAMUSCULAR | Status: DC | PRN

## 2019-11-27 MED ORDER — MIDAZOLAM HCL 5 MG/5ML IJ SOLN
INTRAMUSCULAR | Status: AC
  Filled 2019-11-27: qty 5

## 2019-11-27 MED ORDER — SODIUM CHLORIDE 0.9 % IV SOLN: INTRAVENOUS | Status: DC

## 2019-11-27 MED ORDER — FAMOTIDINE 20 MG PO TABS: 40.0000 mg | ORAL_TABLET | Freq: Once | ORAL | Status: DC | PRN

## 2019-11-27 MED ORDER — CEFAZOLIN SODIUM-DEXTROSE 1-4 GM/50ML-% IV SOLN
INTRAVENOUS | Status: AC
  Administered 2019-11-27: 1 g via INTRAVENOUS
  Filled 2019-11-27: qty 50

## 2019-11-27 SURGICAL SUPPLY — 11 items
BALLN LUTONIX AV 7X60X75 (BALLOONS) ×3
BALLOON LUTONIX AV 7X60X75 (BALLOONS) IMPLANT
CANNULA 5F STIFF (CANNULA) ×2 IMPLANT
DEVICE PRESTO INFLATION (MISCELLANEOUS) ×2 IMPLANT
DRAPE BRACHIAL (DRAPES) ×2 IMPLANT
PACK ANGIOGRAPHY (CUSTOM PROCEDURE TRAY) ×2 IMPLANT
SHEATH BRITE TIP 6FRX5.5 (SHEATH) ×2 IMPLANT
SHEATH BRITE TIP 7FRX5.5 (SHEATH) ×2 IMPLANT
STENT VIABAHN 8X50X120 (Permanent Stent) ×2 IMPLANT
STENT VIABAHN5X120X8X (Permanent Stent) ×1 IMPLANT
WIRE G 018X200 V18 (WIRE) ×2 IMPLANT

## 2019-11-27 NOTE — H&P (Signed)
[facility] History & Physical Update  The patient was interviewed and re-examined.  The patient's previous History and Physical has been reviewed and is unchanged.  There is no change in the plan of care. We plan to proceed with the scheduled procedure.  Leotis Pain, MD  11/27/2019, 10:27 AM

## 2019-11-27 NOTE — Discharge Instructions (Signed)
Moderate Conscious Sedation, Adult, Care After These instructions provide you with information about caring for yourself after your procedure. Your health care provider may also give you more specific instructions. Your treatment has been planned according to current medical practices, but problems sometimes occur. Call your health care provider if you have any problems or questions after your procedure. What can I expect after the procedure? After your procedure, it is common:  To feel sleepy for several hours.  To feel clumsy and have poor balance for several hours.  To have poor judgment for several hours.  To vomit if you eat too soon. Follow these instructions at home: For at least 24 hours after the procedure:   Do not: ? Participate in activities where you could fall or become injured. ? Drive. ? Use heavy machinery. ? Drink alcohol. ? Take sleeping pills or medicines that cause drowsiness. ? Make important decisions or sign legal documents. ? Take care of children on your own.  Rest. Eating and drinking  Follow the diet recommended by your health care provider.  If you vomit: ? Drink water, juice, or soup when you can drink without vomiting. ? Make sure you have little or no nausea before eating solid foods. General instructions  Have a responsible adult stay with you until you are awake and alert.  Take over-the-counter and prescription medicines only as told by your health care provider.  If you smoke, do not smoke without supervision.  Keep all follow-up visits as told by your health care provider. This is important. Contact a health care provider if:  You keep feeling nauseous or you keep vomiting.  You feel light-headed.  You develop a rash.  You have a fever. Get help right away if:  You have trouble breathing. This information is not intended to replace advice given to you by your health care provider. Make sure you discuss any questions you have  with your health care provider. Document Revised: 09/14/2017 Document Reviewed: 01/22/2016 Elsevier Patient Education  [place] A venogram, or venography, is a procedure that uses an X-ray and dye (contrast) to examine how well the veins work and how blood flows through them. Contrast helps the veins show up on X-rays. A venogram may be done:  To evaluate abnormalities in the vein.  To identify clots within veins, such as deep vein thrombosis (DVT).  To map out the veins that might be needed for another procedure. Tell a health care provider about:  Any allergies you have, especially to medicines, shellfish, iodine, and contrast.  All medicines you are taking, including vitamins, herbs, eye drops, creams, and over-the-counter medicines.  Any problems you or family members have had with anesthetic medicines.  Any blood disorders you have.  Any surgeries you have had and any complications that occurred.  Any medical conditions you have.  Whether you are pregnant, may be pregnant, or are breastfeeding.  Any history of smoking or tobacco use. What are the risks? Generally, this is a safe procedure. However, problems may occur, including:  Infection.  Bleeding.  Blood clots.  Allergic reaction to medicines or contrast.  Damage to other structures or organs.  Kidney problems.  Increased risk of cancer. Being exposed to too much radiation over a lifetime can increase the risk of cancer. The risk is small. What happens before the procedure? Medicines Ask your health care provider about:  Changing or stopping your regular medicines. This is especially important if you are taking diabetes medicines  or blood thinners.  Taking medicines such as aspirin and ibuprofen. These medicines can thin your blood. Do not take these medicines unless your health care provider tells you to take them.  Taking over-the-counter medicines, vitamins, herbs, and  supplements. General instructions  Follow instructions from your health care provider about eating or drinking restrictions.  You may have blood tests to check how well your kidneys and liver are working and how well your blood can clot.  Plan to have someone take you home from the hospital or clinic. What happens during the procedure?   An IV will be inserted into one of your veins.  You may be given a medicine to help you relax (sedative).  You will lie down on an X-ray table. The table may be tilted in different directions during the procedure to help the contrast move throughout your body. Safety straps will keep you secure if the table is tilted.  If veins in your arm or leg will be examined, a band may be wrapped around that arm or leg to keep the veins full of blood. This may cause your arm or leg to feel numb.  The contrast will be injected into your IV. You may have a hot, flushed feeling as it moves throughout your body. You may also have a metallic taste in your mouth. Both of these sensations will go away after the test is complete.  You may be asked to lie in different positions or place your legs or arms in different positions.  At the end of the procedure, you may be given IV fluids to help wash or flush the contrast out of your veins.  The IV will be removed, and pressure will be applied to the IV site to prevent bleeding. A bandage (dressing) may be applied to the IV site. The exact procedure may vary among health care providers and hospitals. What can I expect after the procedure?  Your blood pressure, heart rate, breathing rate, and blood oxygen level will be monitored until you leave the hospital or clinic.  You may be given something to eat and drink.  You may have bruising or mild discomfort in the area where the IV was inserted. Follow these instructions at home: Eating and drinking   Follow instructions from your health care provider about eating or  drinking restrictions.  Drink a lot of water for the first several days after the procedure, as directed by your health care provider. This helps to flush the contrast out of your body. Activity  Rest as told by your health care provider.  Return to your normal activities as told by your health care provider. Ask your health care provider what activities are safe for you.  If you were given a sedative during your procedure, do not drive for 24 hours or until your health care provider approves. General instructions  Check your IV insertion area every day for signs of infection. Check for: ? Redness, swelling, or pain. ? Fluid or blood. ? Warmth. ? Pus or a bad smell.  Take over-the-counter and prescription medicines only as told by your health care provider.  Keep all follow-up visits as told by your health care provider. This is important. Contact a health care provider if:  Your skin becomes itchy or you develop a rash or hives.  You have a fever that does not get better with medicine.  You feel nauseous or you vomit.  You have redness, swelling, or pain around the insertion site.  You have fluid or blood coming from the insertion site.  Your insertion area feels warm to the touch.  You have pus or a bad smell coming from the insertion site. Get help right away if you:  Have shortness of breath or difficulty breathing.  Develop chest pain.  Faint.  Feel very dizzy. These symptoms may represent a serious problem that is an emergency. Do not wait to see if the symptoms will go away. Get medical help right away. Call your local emergency services (911 in the [place] Do not drive yourself to the hospital. Summary  A venogram, or venography, is a procedure that uses an X-ray and contrast dye to check how well the veins work and how blood flows through them.  An IV will be inserted into one of your veins in order to inject the contrast.  During the exam, you will lie on  an X-ray table. The table may be tilted in different directions during the procedure to help the contrast move throughout your body. Safety straps will keep you secure.  After the procedure, you will need to drink a lot of water to help wash or flush the contrast out of your body. This information is not intended to replace advice given to you by your health care provider. Make sure you discuss any questions you have with your health care provider. Document Revised: 05/10/2019 Document Reviewed: 05/10/2019 Elsevier Patient Education  [place].

## 2019-11-27 NOTE — Op Note (Signed)
[facility]    OPERATIVE NOTE   PROCEDURE: 1.  Right arm HeRO graft cannulation under ultrasound guidance 2.  right arm shuntogram 3.  Covered stent placement to the junction of the PTFE portion into the central venous portion with an 8 mm diameter by 5 cm length Viabahn stent postdilated with a 7 mm balloon  PRE-OPERATIVE DIAGNOSIS: 1. ESRD 2. Malfunctioning right arm HeRO graft with significant right arm swelling  POST-OPERATIVE DIAGNOSIS: same as above   SURGEON: Leotis Pain, MD  ANESTHESIA: local with MCS  ESTIMATED BLOOD LOSS: 5 cc  FINDING(S): 1. Moderate stenosis in the 60% range with associated moderate sized pseudoaneurysm at the last portion of the PTFE graft to the central venous portion grommet.  The remainder of the central venous portion was widely patent.  This was parked in the right atrium with good flow and no sign of superior vena cava syndrome.  Arterial anastomosis was widely patent with no significant stenosis  SPECIMEN(S):  None  CONTRAST: 35 cc  FLUORO TIME: 1.4 minutes  MODERATE CONSCIOUS SEDATION TIME:  Approximately 15 minutes using 2 mg of Versed and 50 mcg of Fentanyl  INDICATIONS: Jonathon Conley is a 54 y.o. male who presents with malfunctioning right arm hero graft pulling clots at dialysis and having marked right arm swelling.  The patient is scheduled for right arm shuntogram.  The patient is aware the risks include but are not limited to: bleeding, infection, thrombosis of the cannulated access, and possible anaphylactic reaction to the contrast.  The patient is aware of the risks of the procedure and elects to proceed forward.  DESCRIPTION: After full informed written consent was obtained, the patient was brought back to the angiography suite and placed supine upon the angiography table.  The patient was connected to monitoring equipment. Moderate conscious sedation was administered during a face to face encounter  throughout the procedure with my supervision of the RN administering medicines and monitoring the patient's vital signs, pulse oximetry, telemetry and mental status throughout from the start of the procedure until the patient was taken to the recovery room The right arm was prepped and draped in the standard fashion for a percutaneous access intervention.  Under ultrasound guidance, the right arm hero graft was cannulated with a micropuncture needle under direct ultrasound guidance were it was patent and a permanent image was performed.  The microwire was advanced into the graft and the needle was exchanged for the a microsheath.  I then upsized to a 7 Fr Sheath and imaging was performed.  Hand injections were completed to image the access including the central venous system. This demonstrated moderate stenosis in the 60% range with associated moderate sized pseudoaneurysm at the last portion of the PTFE graft to the central venous portion grommet.  The remainder of the central venous portion was widely patent.  This was parked in the right atrium with good flow and no sign of superior vena cava syndrome.  Arterial anastomosis was widely patent with no significant stenosis.  Based on the images, this patient will need intervention to this moderate stenosis and pseudoaneurysm particularly due to its location near the grommet.  I then crossed the stenosis and pseudoaneurysm with a V 18 wire.  Based on the imaging, a 8 mm x 5 cm Viabahn stent was selected and deployed with the most proximal portion just into the grommet going back about 3 to 4 cm into the PTFE.  For postdilatation, a 7 mm  diameter by 6 cm length Lutonix drug-coated angioplasty balloon was selected.  The balloon was centered around the stenosis and inflated to 14 ATM for 1 minute(s).  On completion imaging, a 10% residual stenosis was present.     Based on the completion imaging, no further intervention is necessary.  The wire and balloon were  removed from the sheath.  A 4-0 Monocryl purse-string suture was sewn around the sheath.  The sheath was removed while tying down the suture.  A sterile bandage was applied to the puncture site.  COMPLICATIONS: None  CONDITION: Stable   Leotis Pain  11/27/2019 11:53 AM    This note was created with Dragon Medical transcription system. Any errors in dictation are purely unintentional.

## 2019-11-28 ENCOUNTER — Encounter: Payer: Self-pay | Admitting: General Practice

## 2019-12-02 ENCOUNTER — Encounter: Payer: Self-pay | Admitting: Cardiology

## 2019-12-15 ENCOUNTER — Telehealth: Payer: Self-pay | Admitting: Cardiology

## 2019-12-15 NOTE — Telephone Encounter (Signed)
I received a call from Dr Smith Mince [phone] about this patient who is scheduled to see me for evaluation of resistant HTN. She wanted me to know his history. He has a history of ESRD with 2 prior failed transplants. Now on HD in [place]. Can only go twice a week due to transportation issues. Formerly followed by [facility] but was dismissed due to noncompliance. BP is consistently high at dialysis. 508 systolic or more. Nephrology is trying to maintain dry weight with twice a week therapy. There have been issues with compliance and adherence to meds and apparently patient is unaware if he is taking some medication listed. ? Clonidine.   I thanked her for the information.  Zakyia Gagan Martinique MD, [facility]

## 2019-12-16 ENCOUNTER — Ambulatory Visit: Payer: Medicare Other | Admitting: Cardiology

## 2019-12-31 ENCOUNTER — Other Ambulatory Visit (INDEPENDENT_AMBULATORY_CARE_PROVIDER_SITE_OTHER): Payer: Self-pay | Admitting: Vascular Surgery

## 2019-12-31 DIAGNOSIS — T829XXS Unspecified complication of cardiac and vascular prosthetic device, implant and graft, sequela: Secondary | ICD-10-CM

## 2019-12-31 DIAGNOSIS — Z95820 Peripheral vascular angioplasty status with implants and grafts: Secondary | ICD-10-CM

## 2020-01-01 ENCOUNTER — Ambulatory Visit (INDEPENDENT_AMBULATORY_CARE_PROVIDER_SITE_OTHER): Payer: Medicare Other | Admitting: Nurse Practitioner

## 2020-01-01 ENCOUNTER — Ambulatory Visit (INDEPENDENT_AMBULATORY_CARE_PROVIDER_SITE_OTHER): Payer: Medicare Other

## 2020-01-01 ENCOUNTER — Other Ambulatory Visit: Payer: Self-pay

## 2020-01-01 ENCOUNTER — Encounter (INDEPENDENT_AMBULATORY_CARE_PROVIDER_SITE_OTHER): Payer: Self-pay | Admitting: Nurse Practitioner

## 2020-01-01 VITALS — BP 207/127 | HR 90 | Resp 16 | Wt 178.8 lb

## 2020-01-01 DIAGNOSIS — Z992 Dependence on renal dialysis: Secondary | ICD-10-CM | POA: Diagnosis not present

## 2020-01-01 DIAGNOSIS — N186 End stage renal disease: Secondary | ICD-10-CM

## 2020-01-01 DIAGNOSIS — T829XXS Unspecified complication of cardiac and vascular prosthetic device, implant and graft, sequela: Secondary | ICD-10-CM

## 2020-01-01 DIAGNOSIS — I10 Essential (primary) hypertension: Secondary | ICD-10-CM | POA: Diagnosis not present

## 2020-01-01 DIAGNOSIS — Z95820 Peripheral vascular angioplasty status with implants and grafts: Secondary | ICD-10-CM

## 2020-01-06 ENCOUNTER — Encounter (INDEPENDENT_AMBULATORY_CARE_PROVIDER_SITE_OTHER): Payer: Self-pay | Admitting: Nurse Practitioner

## 2020-01-06 NOTE — Progress Notes (Signed)
SUBJECTIVE:  Patient ID: Jonathon Conley, male    DOB: 10-21-1965, 54 y.o.   MRN: [id number] Chief Complaint  Patient presents with  . Follow-up    [facility] 5week post fistula    HPI  Jonathon Conley is a 54 y.o. male The patient returns to the office for followup status post intervention of the dialysis access right upper extremity hero graft. Following the intervention the access function has significantly improved, with better flow rates and improved KT/V. The patient has not been experiencing increased bleeding times following decannulation and the patient denies increased recirculation.  There appears to be a hematoma present in his upper extremity.  The patient denies amaurosis fugax or recent TIA symptoms. There are no recent neurological changes noted. The patient denies claudication symptoms or rest pain symptoms. The patient denies history of DVT, PE or superficial thrombophlebitis. The patient denies recent episodes of angina or shortness of breath.   Noninvasive studies today show a flow volume of 1426.  The hero graft appears to be patent throughout.  There are elevated velocities at the arterial anastomosis as well as the proximal segment of the graft mainly due to hematoma in that area of the upper extremity.  There is no evidence of a pseudoaneurysm seen.  No evidence of steal syndrome.     Past Medical History:  Diagnosis Date  . Anemia   . Asthma    childhood  . Blood transfusion without reported diagnosis   . CHF (congestive heart failure) ([place])   . Chronic kidney disease    Dialysis Mon, Wed, Fri  . Colitis   . COPD (chronic obstructive pulmonary disease) ([place])   . Diverticulosis 04/12/16   also seen: sigmoid and rectal erythema, path:   Marland Kitchen Dyspnea   . Heart murmur   . Hemorrhoids 03/2016   bleeding at 04/12/16 colonoscopy, treated with  APC laser. cauterization  . Hyperlipidemia   . Hypertension   . OSA (obstructive sleep apnea) 03/27/2013    Past Surgical  History:  Procedure Laterality Date  . A/V SHUNTOGRAM Right 04/17/2019   Procedure: A/V SHUNTOGRAM;  Surgeon: Algernon Huxley, MD;  Location: [place] [facility];  Service: Cardiovascular;  Laterality: Right;  . APPLICATION OF WOUND VAC Right 12/13/2016   Procedure: APPLICATION OF WOUND VAC;  Surgeon: Algernon Huxley, MD;  Location: [facility];  Service: Vascular;  Laterality: Right;  . APPLICATION OF WOUND VAC Right 12/19/2016   Procedure: APPLICATION OF WOUND VAC;  Surgeon: Algernon Huxley, MD;  Location: [facility];  Service: Vascular;  Laterality: Right;  . COLONOSCOPY N/A 04/12/2016   Procedure: COLONOSCOPY;  Surgeon: Mauri Pole, MD;  Location: [place] [facility];  Service: Endoscopy;  Laterality: N/A;  . DIALYSIS/PERMA CATHETER INSERTION N/A 02/26/2017   Procedure: Dialysis/Perma Catheter Insertion;  Surgeon: Algernon Huxley, MD;  Location: [place] [facility];  Service: Cardiovascular;  Laterality: N/A;  . DIALYSIS/PERMA CATHETER INSERTION N/A 03/26/2018   Procedure: DIALYSIS/PERMA CATHETER INSERTION;  Surgeon: Katha Cabal, MD;  Location: [place] [facility];  Service: Cardiovascular;  Laterality: N/A;  . DIALYSIS/PERMA CATHETER REMOVAL N/A 01/16/2019   Procedure: DIALYSIS/PERMA CATHETER REMOVAL;  Surgeon: Algernon Huxley, MD;  Location: [place] [facility];  Service: Cardiovascular;  Laterality: N/A;  . EXCHANGE OF A DIALYSIS CATHETER  11/20/2016   Procedure: Exchange Of A Dialysis Catheter;  Surgeon: Algernon Huxley, MD;  Location: [place] [facility];  Service: Cardiovascular;;  . fistulagrams Right   .  HEMATOMA EVACUATION Right 12/13/2016   Procedure: EVACUATION HEMATOMA;  Surgeon: Algernon Huxley, MD;  Location: [facility];  Service: Vascular;  Laterality: Right;  . HEMATOMA EVACUATION Right 12/19/2016   Procedure: EVACUATIONof seroma;  Surgeon: Algernon Huxley, MD;  Location: [facility];  Service: Vascular;  Laterality: Right;  . HOT HEMOSTASIS N/A 04/12/2016   Procedure: HOT HEMOSTASIS (ARGON PLASMA  COAGULATION/BICAP);  Surgeon: Mauri Pole, MD;  Location: [facility];  Service: Endoscopy;  Laterality: N/A;  . KIDNEY TRANSPLANT    . LIGATIONS OF HERO GRAFT Right 04/25/2018   Procedure: LIGATIONS OF HERO GRAFT ( REVISION );  Surgeon: Algernon Huxley, MD;  Location: [facility];  Service: Vascular;  Laterality: Right;  . PERIPHERAL VASCULAR CATHETERIZATION Bilateral 10/02/2016   Procedure: Upper Extremity Venography;  Surgeon: Algernon Huxley, MD;  Location: [place] [facility];  Service: Cardiovascular;  Laterality: Bilateral;  . PERIPHERAL VASCULAR CATHETERIZATION Right 11/08/2016   Procedure: DIALYSIS/PERMA CATHETER REMOVAL right Jugular;  Surgeon: Algernon Huxley, MD;  Location: [facility];  Service: Vascular;  Laterality: Right;  . PERIPHERAL VASCULAR CATHETERIZATION Left 11/08/2016   Procedure: DIALYSIS/PERMA CATHETER INSERTION left jugular with u/s guide and flouroscan;  Surgeon: Algernon Huxley, MD;  Location: [facility];  Service: Vascular;  Laterality: Left;  . PERIPHERAL VASCULAR CATHETERIZATION N/A 11/08/2016   Procedure: IVC FILTER INSERTION;  Surgeon: Algernon Huxley, MD;  Location: [facility];  Service: Vascular;  Laterality: N/A;  . perm a cath in Rt upper chest Right   . THORACENTESIS    . UPPER EXTREMITY ANGIOGRAPHY Right 11/28/2018   Procedure: UPPER EXTREMITY ANGIOGRAPHY;  Surgeon: Algernon Huxley, MD;  Location: [place] [facility];  Service: Cardiovascular;  Laterality: Right;  . UPPER EXTREMITY VENOGRAPHY Bilateral 11/27/2019   Procedure: UPPER EXTREMITY VENOGRAPHY;  Surgeon: Algernon Huxley, MD;  Location: [place] [facility];  Service: Cardiovascular;  Laterality: Bilateral;  . VASCULAR ACCESS DEVICE INSERTION Right 11/08/2016   Procedure: INSERTION OF HERO VASCULAR ACCESS DEVICE ( GRAFT );  Surgeon: Algernon Huxley, MD;  Location: [facility];  Service: Vascular;  Laterality: Right;    Social History   Socioeconomic History  . Marital status: Married    Spouse name: Not on file  . Number of  children: Not on file  . Years of education: Not on file  . Highest education level: Not on file  Occupational History  . Not on file  Tobacco Use  . Smoking status: Never Smoker  . Smokeless tobacco: Never Used  Substance and Sexual Activity  . Alcohol use: No  . Drug use: No  . Sexual activity: Not on file  Other Topics Concern  . 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 Difficulty of Paying Living Expenses:   Food Insecurity:   . Worried About Charity fundraiser in the Last Year:   . Arboriculturist in the Last Year:   Transportation Needs:   . Film/video editor (Medical):   Marland Kitchen Lack of Transportation (Non-Medical):   Physical Activity:   . Days of Exercise per Week:   . Minutes of Exercise per Session:   Stress:   . Feeling of Stress :   Social Connections:   . Frequency of Communication with Friends and Family:   . Frequency of Social Gatherings with Friends and Family:   .  Attends Religious Services:   . Active Member of Clubs or Organizations:   . Attends Archivist Meetings:   Marland Kitchen Marital Status:   Intimate Partner Violence:   . Fear of Current or Ex-Partner:   . Emotionally Abused:   Marland Kitchen Physically Abused:   . Sexually Abused:     Family History  Problem Relation Age of Onset  . Heart disease Mother   . Cancer Mother        ovarian  . Cancer Maternal Grandmother   . Cancer Maternal Grandfather   . Asthma Son   . Asthma Son     Allergies  Allergen Reactions  . Amlodipine Swelling    Patient reported  . Lisinopril Swelling    Facial swelling     Review of Systems   Review of Systems: Negative Unless Checked Constitutional: [] Weight loss  [] Fever  [] Chills Cardiac: [] Chest pain   []  Atrial Fibrillation  [] Palpitations   [] Shortness of breath when laying flat   [] Shortness of breath with exertion. [] Shortness of  breath at rest Vascular:  [] Pain in legs with walking   [] Pain in legs with standing [] Pain in legs when laying flat   [] Claudication    [] Pain in feet when laying flat    [] History of DVT   [] Phlebitis   [] Swelling in legs   [] Varicose veins   [] Non-healing ulcers Pulmonary:   [] Uses home oxygen   [] Productive cough   [] Hemoptysis   [] Wheeze  [] COPD   [] Asthma Neurologic:  [] Dizziness   [] Seizures  [] Blackouts [] History of stroke   [] History of TIA  [] Aphasia   [] Temporary Blindness   [] Weakness or numbness in arm   [] Weakness or numbness in leg Musculoskeletal:   [] Joint swelling   [] Joint pain   [] Low back pain  []  History of Knee Replacement [] Arthritis [] back Surgeries  []  Spinal Stenosis    Hematologic:  [] Easy bruising  [] Easy bleeding   [] Hypercoagulable state   [x] Anemic Gastrointestinal:  [] Diarrhea   [] Vomiting  [] Gastroesophageal reflux/heartburn   [] Difficulty swallowing. [] Abdominal pain Genitourinary:  [x] Chronic kidney disease   [] Difficult urination  [] Anuric   [] Blood in urine [] Frequent urination  [] Burning with urination   [] Hematuria Skin:  [] Rashes   [] Ulcers [] Wounds Psychological:  [] History of anxiety   []  History of major depression  []  Memory Difficulties      OBJECTIVE:   Physical Exam  BP (!) 207/127 (BP Location: Left Arm)   Pulse 90   Resp 16   Wt 178 lb 12.8 oz (81.1 kg)   BMI 24.94 kg/m   Gen: WD/WN, NAD Head: [place]/AT, No temporalis wasting.  Ear/Nose/Throat: Hearing grossly intact, nares w/o erythema or drainage Eyes: PER, EOMI, sclera nonicteric.  Neck: Supple, no masses.  No JVD.  Pulmonary:  Good air movement, no use of accessory muscles.  Cardiac: RRR Vascular:  Good thrill and bruit Vessel Right Left  Radial Palpable Palpable   Gastrointestinal: soft, non-distended. No guarding/no peritoneal signs.  Musculoskeletal: M/S 5/5 throughout.  No deformity or atrophy.  Neurologic: Pain and light touch intact in extremities.  Symmetrical.  Speech is  fluent. Motor exam as listed above. Psychiatric: Judgment intact, Mood & affect appropriate for pt's clinical situation.       ASSESSMENT AND PLAN:  1. ESRD on dialysis [facility]) While the patient does have adequate flow volumes he does have a large hematoma which is causing some compression and stenosis.  We will have the patient return in 3 months to reevaluate  the patient swelling as well as to reevaluate the patient studies to ensure there is no worsening stenosis after the hematoma has resolved.  Patient is advised to contact her office if there are issues in the interim with his dialysis access.  2. Malignant hypertension The patient does have extremely elevated blood pressure today however this is not unknown by his nephrologist.  The patient has an upcoming appointment with the cardiologist in order to try to titrate medications.  The patient is nonsymptomatic at this time and he has been dealing with elevated blood pressures for some time.   Current Outpatient Medications on File Prior to Visit  Medication Sig Dispense Refill  . aspirin EC 81 MG tablet Take 81 mg by mouth daily.     . calcitRIOL (ROCALTROL) 0.5 MCG capsule Take 1 capsule (0.5 mcg total) by mouth Every Tuesday,Thursday,and Saturday with dialysis. 15 capsule 0  . calcium acetate (PHOSLO) 667 MG capsule Take 667 mg by mouth 3 (three) times daily with meals.    . carvedilol (COREG) 25 MG tablet Take 25 mg by mouth 2 (two) times daily.   0  . cloNIDine (CATAPRES) 0.2 MG tablet Take 0.2 mg by mouth 2 (two) times daily.    . hydrALAZINE (APRESOLINE) 25 MG tablet Take 1 tablet (25 mg total) by mouth 3 (three) times daily. 90 tablet 0  . lactulose (CHRONULAC) 10 GM/15ML solution Take by mouth.    . losartan (COZAAR) 100 MG tablet Take 1 tablet (100 mg total) by mouth daily. 30 tablet 0  . sevelamer carbonate (RENVELA) 800 MG tablet Take 1,600-2,400 mg by mouth 5 (five) times daily.    . simvastatin (ZOCOR) 5 MG tablet Take 5 mg  by mouth Nightly.    . cloNIDine (CATAPRES - DOSED IN MG/24 HR) 0.1 mg/24hr patch Take 0.2 mg by mouth.      No current facility-administered medications on file prior to visit.    There are no Patient Instructions on file for this visit. No follow-ups on file.   Kris Hartmann, NP  This note was completed with Sales executive.  Any errors are purely unintentional.

## 2020-01-19 NOTE — Progress Notes (Signed)
Cardiology Office Note   Date:  01/22/2020   ID:  Jonathon Conley, DOB [birth year], MRN [id number]  PCP:  Tresa Garter, MD  Cardiologist:   Sandara Tyree Martinique, MD   Chief Complaint  Patient presents with  . Hypertension      History of Present Illness: Jonathon Conley is a 54 y.o. male who is seen at the request of Dr Smith Mince- Nephrologist in [place] for evaluation of resistant HTN. He has a history of ESRD since 1996 with 1 prior failed transplant. Initial transplant in 2001. Failed in 2016 and he has been back on HD. He is on list at [facility] for another transplant. Now on HD in [place].  Formerly followed by [place] [facility] but was dismissed due to noncompliance. BP is consistently high at dialysis. 387 systolic or more.   Patient reports he has dialysis 3 times a week. At one point he was going only twice a week due to transportation issues but now is doing it 3 days a week. Reports BP has been much higher since August. He does note that he doesn't always take medication as prescribed usually because he forgets. Now uses an alarm on his phone to remind him. He has no issues with cost of medication or problems getting medication. Most recently hydralazine dose increased but he noted no difference with this. Understands importance of sodium restriction. Current medication includes clonidine 0.2 mg bid. Hydralazine 50 mg tid. Amlodipine 5 mg daily, Coreg 25 mg bid and losartan 100 mg daily.   He did have recent evaluation for transplant at [facility] in December with Lexiscan Myoview and Echo. Myoview was normal. Echo showed LVH but was otherwise normal. He does have OSA on CPAP. Reports compliance.     Past Medical History:  Diagnosis Date  . Anemia   . Asthma    childhood  . Blood transfusion without reported diagnosis   . CHF (congestive heart failure) ([place])   . Chronic kidney disease    Dialysis Mon, Wed, Fri  . Colitis   . COPD (chronic obstructive pulmonary disease) ([place])   .  Diverticulosis 04/12/16   also seen: sigmoid and rectal erythema, path:   Marland Kitchen Dyspnea   . Heart murmur   . Hemorrhoids 03/2016   bleeding at 04/12/16 colonoscopy, treated with  APC laser. cauterization  . Hyperlipidemia   . Hypertension   . OSA (obstructive sleep apnea) 03/27/2013    Past Surgical History:  Procedure Laterality Date  . A/V SHUNTOGRAM Right 04/17/2019   Procedure: A/V SHUNTOGRAM;  Surgeon: Algernon Huxley, MD;  Location: [place] [facility];  Service: Cardiovascular;  Laterality: Right;  . APPLICATION OF WOUND VAC Right 12/13/2016   Procedure: APPLICATION OF WOUND VAC;  Surgeon: Algernon Huxley, MD;  Location: [facility];  Service: Vascular;  Laterality: Right;  . APPLICATION OF WOUND VAC Right 12/19/2016   Procedure: APPLICATION OF WOUND VAC;  Surgeon: Algernon Huxley, MD;  Location: [facility];  Service: Vascular;  Laterality: Right;  . COLONOSCOPY N/A 04/12/2016   Procedure: COLONOSCOPY;  Surgeon: Mauri Pole, MD;  Location: [facility];  Service: Endoscopy;  Laterality: N/A;  . DIALYSIS/PERMA CATHETER INSERTION N/A 02/26/2017   Procedure: Dialysis/Perma Catheter Insertion;  Surgeon: Algernon Huxley, MD;  Location: [place] [facility];  Service: Cardiovascular;  Laterality: N/A;  . DIALYSIS/PERMA CATHETER INSERTION N/A 03/26/2018   Procedure: DIALYSIS/PERMA CATHETER INSERTION;  Surgeon: Katha Cabal, MD;  Location: [place] [facility];  Service:  Cardiovascular;  Laterality: N/A;  . DIALYSIS/PERMA CATHETER REMOVAL N/A 01/16/2019   Procedure: DIALYSIS/PERMA CATHETER REMOVAL;  Surgeon: Algernon Huxley, MD;  Location: [place] [facility];  Service: Cardiovascular;  Laterality: N/A;  . EXCHANGE OF A DIALYSIS CATHETER  11/20/2016   Procedure: Exchange Of A Dialysis Catheter;  Surgeon: Algernon Huxley, MD;  Location: [place] [facility];  Service: Cardiovascular;;  . fistulagrams Right   . HEMATOMA EVACUATION Right 12/13/2016   Procedure: EVACUATION HEMATOMA;  Surgeon: Algernon Huxley, MD;  Location:  [facility];  Service: Vascular;  Laterality: Right;  . HEMATOMA EVACUATION Right 12/19/2016   Procedure: EVACUATIONof seroma;  Surgeon: Algernon Huxley, MD;  Location: [facility];  Service: Vascular;  Laterality: Right;  . HOT HEMOSTASIS N/A 04/12/2016   Procedure: HOT HEMOSTASIS (ARGON PLASMA COAGULATION/BICAP);  Surgeon: Mauri Pole, MD;  Location: [facility];  Service: Endoscopy;  Laterality: N/A;  . KIDNEY TRANSPLANT    . LIGATIONS OF HERO GRAFT Right 04/25/2018   Procedure: LIGATIONS OF HERO GRAFT ( REVISION );  Surgeon: Algernon Huxley, MD;  Location: [facility];  Service: Vascular;  Laterality: Right;  . PERIPHERAL VASCULAR CATHETERIZATION Bilateral 10/02/2016   Procedure: Upper Extremity Venography;  Surgeon: Algernon Huxley, MD;  Location: [place] [facility];  Service: Cardiovascular;  Laterality: Bilateral;  . PERIPHERAL VASCULAR CATHETERIZATION Right 11/08/2016   Procedure: DIALYSIS/PERMA CATHETER REMOVAL right Jugular;  Surgeon: Algernon Huxley, MD;  Location: [facility];  Service: Vascular;  Laterality: Right;  . PERIPHERAL VASCULAR CATHETERIZATION Left 11/08/2016   Procedure: DIALYSIS/PERMA CATHETER INSERTION left jugular with u/s guide and flouroscan;  Surgeon: Algernon Huxley, MD;  Location: [facility];  Service: Vascular;  Laterality: Left;  . PERIPHERAL VASCULAR CATHETERIZATION N/A 11/08/2016   Procedure: IVC FILTER INSERTION;  Surgeon: Algernon Huxley, MD;  Location: [facility];  Service: Vascular;  Laterality: N/A;  . perm a cath in Rt upper chest Right   . THORACENTESIS    . UPPER EXTREMITY ANGIOGRAPHY Right 11/28/2018   Procedure: UPPER EXTREMITY ANGIOGRAPHY;  Surgeon: Algernon Huxley, MD;  Location: [place] [facility];  Service: Cardiovascular;  Laterality: Right;  . UPPER EXTREMITY VENOGRAPHY Bilateral 11/27/2019   Procedure: UPPER EXTREMITY VENOGRAPHY;  Surgeon: Algernon Huxley, MD;  Location: [place] [facility];  Service: Cardiovascular;  Laterality: Bilateral;  . VASCULAR ACCESS DEVICE INSERTION Right  11/08/2016   Procedure: INSERTION OF HERO VASCULAR ACCESS DEVICE ( GRAFT );  Surgeon: Algernon Huxley, MD;  Location: [facility];  Service: Vascular;  Laterality: Right;     Current Outpatient Medications  Medication Sig Dispense Refill  . amLODipine (NORVASC) 5 MG tablet Take 1 tablet (5 mg total) by mouth in the morning and at bedtime. 180 tablet 3  . aspirin EC 81 MG tablet Take 81 mg by mouth daily.     . calcitRIOL (ROCALTROL) 0.5 MCG capsule Take 1 capsule (0.5 mcg total) by mouth Every Tuesday,Thursday,and Saturday with dialysis. 15 capsule 0  . carvedilol (COREG) 25 MG tablet Take 25 mg by mouth 2 (two) times daily.   0  . cloNIDine (CATAPRES) 0.2 MG tablet Take 0.2 mg by mouth 2 (two) times daily.    . hydrALAZINE (APRESOLINE) 25 MG tablet Take 1 tablet (25 mg total) by mouth 3 (three) times daily. 90 tablet 0  . lactulose (CHRONULAC) 10 GM/15ML solution Take by mouth.    . losartan (COZAAR) 100 MG tablet Take 1 tablet (100 mg total) by mouth  daily. 30 tablet 0  . Methoxy PEG-Epoetin Beta (MIRCERA IJ) Mircera    . sevelamer carbonate (RENVELA) 800 MG tablet Take 800 mg by mouth 3 (three) times daily with meals.    . simvastatin (ZOCOR) 5 MG tablet Take 5 mg by mouth Nightly.    . hydrALAZINE (APRESOLINE) 50 MG tablet Take 1 tablet (50 mg total) by mouth 3 (three) times daily. 270 tablet 3   No current facility-administered medications for this visit.    Allergies:   Amlodipine and Lisinopril    Social History:  The patient  reports that he has never smoked. He has never used smokeless tobacco. He reports that he does not drink alcohol or use drugs.   Family History:  The patient's family history includes Asthma in his son and son; Cancer in his maternal grandfather, maternal grandmother, and mother; Heart disease in his mother; Kidney failure in his maternal aunt.    ROS:  Please see the history of present illness.   Otherwise, review of systems are positive for notes swelling in his  right arm. Has Hero graft in upper right arm..   All other systems are reviewed and negative.    PHYSICAL EXAM: VS:  BP (!) 162/102   Pulse 72   Temp (!) 97.5 F (36.4 C)   Ht 5' (1.524 m)   Wt 179 lb (81.2 kg)   HC 71" (180.3 cm)   SpO2 97%   BMI 34.96 kg/m  , BMI Body mass index is 34.96 kg/m. GEN: Well nourished, well developed, in no acute distress  HEENT: normal  Neck: no JVD, carotid bruits, or masses Cardiac: RRR; no murmurs, rubs, or gallops,no edema  Respiratory:  clear to auscultation bilaterally, normal work of breathing GI: soft, nontender, nondistended, + BS MS: no deformity or atrophy  Skin: warm and dry, no rash Neuro:  Strength and sensation are intact Psych: euthymic mood, full affect Vascular palpable thrill and bruit right upper arm/axilla from Hero graft.    EKG:  EKG is ordered today. The ekg ordered today demonstrates NSR with LVH and repolarization abnormality. Rate 72. I have personally reviewed and interpreted this study.    Recent Labs: 10/20/2019: BUN 29; Creatinine, Ser 16.52; Hemoglobin 9.3; Platelets 156; Potassium 4.0; Sodium 139    Lipid Panel No results found for: CHOL, TRIG, HDL, CHOLHDL, VLDL, LDLCALC, LDLDIRECT   Dated 09/15/19: Hgb 9.4. WBC 3.5. plts 104K. Potassium 5.7. creatinine 17.21. BUN 54. Cholesterol 113, triglycerides 45, HDL 42, LDL 60. BNP 1426.     Wt Readings from Last 3 Encounters:  01/22/20 179 lb (81.2 kg)  01/20/20 181 lb (82.1 kg)  01/01/20 178 lb 12.8 oz (81.1 kg)      Other studies Reviewed: Additional studies/ records that were reviewed today include:   Echo 04/09/18: Study Conclusions   - Left ventricle: Wall thickness was increased in a pattern of mild  LVH. Systolic function was normal. The estimated ejection  fraction was in the range of 60% to 65%.   Myoview 10/16/19:  NUCLEAR MYOCARDIAL PERFUSION IMAGING WITH SPECT, 10/16/2019 12:25 PM  INDICATION: pre txp eval \ Z01.818 Pre-transplant  evaluation for kidney transplant  COMPARISON: None.  The patient denied having consumed any caffeinated and de-caffeinated beverage and food during the 24 hours preceding the pharmacologic stress test.  TECHNIQUE: A Lexiscan stress protocol was used. 11 mCi of Tc-40mTetrofosmin was administered intravenously at rest and 32 mCi was administered intravenously at stress. Gated SPECT images were obtained  and processed.   LIMITATIONS: None.  FINDINGS:  Unexpected: None.  Perfusion defects: No reversible or fixed perfusion defects.  TID: <1.25  Gated images:  Ejection fraction: Greater than 70%.  Wall motion abnormalities: None.  Other Result Information  Interface, Rad Results In - 10/16/2019 12:30 PM EST NUCLEAR MYOCARDIAL PERFUSION IMAGING WITH SPECT, 10/16/2019 12:25 PM  INDICATION: pre txp eval \ Z01.818 Pre-transplant evaluation for kidney transplant  COMPARISON: None.  The patient denied having consumed any caffeinated and de-caffeinated beverage and food during the 24 hours preceding the pharmacologic stress test.  TECHNIQUE: A Lexiscan stress protocol was used. 11 mCi of Tc-73mTetrofosmin was administered intravenously at rest and 32 mCi was administered intravenously at stress. Gated SPECT images were obtained and processed.   LIMITATIONS: None.  FINDINGS:  Unexpected: None.  Perfusion defects: No reversible or fixed perfusion defects.  TID: <1.25  Gated images:  Ejection fraction: Greater than 70%.  Wall motion abnormalities: None.  CONCLUSION:  No inducible ischemia or infarction. Normal left ventricular function.   Echo 10/07/19: SUMMARY The left ventricular size is normal. There is mild concentric left ventricular hypertrophy. LV ejection fraction = 60-65%. Left ventricular filling pattern is indeterminate. The left ventricular wall motion is normal. The right ventricle is normal in size and function. There is mild mitral annular calcification.  IVC size was normal. There is no pericardial effusion. There is no significant valvular stenosis or regurgitation. There is no comparison study available.   ASSESSMENT AND PLAN:  1.  Refractory HTN with severely elevated BP readings despite multiple medications. Exacerbated by ESRD requiring dialysis. Volume status looks good today. Compliance has been an issue for him in the past and may be playing a role but he appears knowledgeable about his medications and is making efforts not to miss doses. There are no clear barriers to getting his medication. Stressed importance of sodium restriction. Today will increase amlodipine to 5 mg bid. May need to increase hydralazine as well. I would like for him to follow up in our resistant HTN clinic. Optimal BP control is very important given consideration for another renal transplant.  2. ESRD on HD 3. OSA on CPAP.    Current medicines are reviewed at length with the patient today.  The patient does not have concerns regarding medicines.  The following changes have been made:  Increase amlodipine to 5 mg bid.   Labs/ tests ordered today include:   Orders Placed This Encounter  Procedures  . EKG 12-Lead     Disposition:   FU with resistant HTN clinic   Signed, Ozzie Knobel JMartinique MD  01/22/2020 12:04 PM    [place] [place] [id number] [phone] Fax [phone]

## 2020-01-20 ENCOUNTER — Ambulatory Visit (INDEPENDENT_AMBULATORY_CARE_PROVIDER_SITE_OTHER): Payer: Medicare Other | Admitting: Family Medicine

## 2020-01-20 ENCOUNTER — Encounter: Payer: Self-pay | Admitting: Family Medicine

## 2020-01-20 ENCOUNTER — Other Ambulatory Visit: Payer: Self-pay

## 2020-01-20 VITALS — BP 162/100 | HR 79 | Temp 98.3°F | Resp 16 | Ht 71.0 in | Wt 181.0 lb

## 2020-01-20 DIAGNOSIS — R600 Localized edema: Secondary | ICD-10-CM

## 2020-01-20 DIAGNOSIS — N186 End stage renal disease: Secondary | ICD-10-CM

## 2020-01-20 DIAGNOSIS — I10 Essential (primary) hypertension: Secondary | ICD-10-CM | POA: Diagnosis not present

## 2020-01-20 MED ORDER — CLONIDINE HCL 0.1 MG PO TABS
0.1000 mg | ORAL_TABLET | Freq: Once | ORAL | Status: AC
  Administered 2020-01-20: 10:00:00 0.1 mg via ORAL

## 2020-01-20 NOTE — Progress Notes (Signed)
Patient [facility]    Established Patient Office Visit  Subjective:  Patient ID: Jonathon Conley, male    DOB: 07/21/1966  Age: 54 y.o. MRN: [id number]  CC:  Chief Complaint  Patient presents with  . Annual Exam  Hegde, Akhil  HPI Jonathon Conley is a 54 year old male with a medical history significant for end-stage renal disease on dialysis, failed renal transplant, malignant hypertension, obstructive sleep apnea, and anemia of chronic disease presents with complaints of right upper extremity swelling. Patient states that he has had right upper extremity swelling over the past several months that has not dissipated.  He has had this problem in the past.  He has been evaluated by vein and vascular specialist and has undergone ultrasound which does not show any abnormalities in blood flow.  Patient was last evaluated on 01/14/2020.  He is not having pain to extremities.  However, he characterizes right arm as being "heavy".  He denies any redness or warmth.  Also, patient is not having drainage.  Patient has been undergoing dialysis 3 times per week without complication. Patient reports that blood pressure has been uncontrolled over the past 8 months.  Medications have been adjusted periodically by nephrologist without improvement.  Patient uses phone to remind him to take medications.  He states that hydralazine has been added within the past several weeks, no improvement has been noted.  Patient is currently on 5 blood pressure medications and blood pressure was 182/106 on arrival.  He states that he did not take hydralazine prior to presenting.  Past Medical History:  Diagnosis Date  . Anemia   . Asthma    childhood  . Blood transfusion without reported diagnosis   . CHF (congestive heart failure) ([place])   . Chronic kidney disease    Dialysis Mon, Wed, Fri  . Colitis   . COPD (chronic obstructive pulmonary disease) ([place])   . Diverticulosis 04/12/16     also seen: sigmoid and rectal erythema, path:   Marland Kitchen Dyspnea   . Heart murmur   . Hemorrhoids 03/2016   bleeding at 04/12/16 colonoscopy, treated with  APC laser. cauterization  . Hyperlipidemia   . Hypertension   . OSA (obstructive sleep apnea) 03/27/2013    Past Surgical History:  Procedure Laterality Date  . A/V SHUNTOGRAM Right 04/17/2019   Procedure: A/V SHUNTOGRAM;  Surgeon: Algernon Huxley, MD;  Location: [place] [facility];  Service: Cardiovascular;  Laterality: Right;  . APPLICATION OF WOUND VAC Right 12/13/2016   Procedure: APPLICATION OF WOUND VAC;  Surgeon: Algernon Huxley, MD;  Location: [facility];  Service: Vascular;  Laterality: Right;  . APPLICATION OF WOUND VAC Right 12/19/2016   Procedure: APPLICATION OF WOUND VAC;  Surgeon: Algernon Huxley, MD;  Location: [facility];  Service: Vascular;  Laterality: Right;  . COLONOSCOPY N/A 04/12/2016   Procedure: COLONOSCOPY;  Surgeon: Mauri Pole, MD;  Location: [facility];  Service: Endoscopy;  Laterality: N/A;  . DIALYSIS/PERMA CATHETER INSERTION N/A 02/26/2017   Procedure: Dialysis/Perma Catheter Insertion;  Surgeon: Algernon Huxley, MD;  Location: [place] [facility];  Service: Cardiovascular;  Laterality: N/A;  . DIALYSIS/PERMA CATHETER INSERTION N/A 03/26/2018   Procedure: DIALYSIS/PERMA CATHETER INSERTION;  Surgeon: Katha Cabal, MD;  Location: [place] [facility];  Service: Cardiovascular;  Laterality: N/A;  . DIALYSIS/PERMA CATHETER REMOVAL N/A 01/16/2019   Procedure: DIALYSIS/PERMA CATHETER REMOVAL;  Surgeon: Algernon Huxley, MD;  Location: [facility];  Service: Cardiovascular;  Laterality: N/A;  . EXCHANGE OF A DIALYSIS CATHETER  11/20/2016   Procedure: Exchange Of A Dialysis Catheter;  Surgeon: Algernon Huxley, MD;  Location: [place] [facility];  Service: Cardiovascular;;  . fistulagrams Right   . HEMATOMA EVACUATION Right 12/13/2016   Procedure: EVACUATION HEMATOMA;  Surgeon: Algernon Huxley, MD;  Location: [facility];  Service:  Vascular;  Laterality: Right;  . HEMATOMA EVACUATION Right 12/19/2016   Procedure: EVACUATIONof seroma;  Surgeon: Algernon Huxley, MD;  Location: [facility];  Service: Vascular;  Laterality: Right;  . HOT HEMOSTASIS N/A 04/12/2016   Procedure: HOT HEMOSTASIS (ARGON PLASMA COAGULATION/BICAP);  Surgeon: Mauri Pole, MD;  Location: [facility];  Service: Endoscopy;  Laterality: N/A;  . KIDNEY TRANSPLANT    . LIGATIONS OF HERO GRAFT Right 04/25/2018   Procedure: LIGATIONS OF HERO GRAFT ( REVISION );  Surgeon: Algernon Huxley, MD;  Location: [facility];  Service: Vascular;  Laterality: Right;  . PERIPHERAL VASCULAR CATHETERIZATION Bilateral 10/02/2016   Procedure: Upper Extremity Venography;  Surgeon: Algernon Huxley, MD;  Location: [place] [facility];  Service: Cardiovascular;  Laterality: Bilateral;  . PERIPHERAL VASCULAR CATHETERIZATION Right 11/08/2016   Procedure: DIALYSIS/PERMA CATHETER REMOVAL right Jugular;  Surgeon: Algernon Huxley, MD;  Location: [facility];  Service: Vascular;  Laterality: Right;  . PERIPHERAL VASCULAR CATHETERIZATION Left 11/08/2016   Procedure: DIALYSIS/PERMA CATHETER INSERTION left jugular with u/s guide and flouroscan;  Surgeon: Algernon Huxley, MD;  Location: [facility];  Service: Vascular;  Laterality: Left;  . PERIPHERAL VASCULAR CATHETERIZATION N/A 11/08/2016   Procedure: IVC FILTER INSERTION;  Surgeon: Algernon Huxley, MD;  Location: [facility];  Service: Vascular;  Laterality: N/A;  . perm a cath in Rt upper chest Right   . THORACENTESIS    . UPPER EXTREMITY ANGIOGRAPHY Right 11/28/2018   Procedure: UPPER EXTREMITY ANGIOGRAPHY;  Surgeon: Algernon Huxley, MD;  Location: [place] [facility];  Service: Cardiovascular;  Laterality: Right;  . UPPER EXTREMITY VENOGRAPHY Bilateral 11/27/2019   Procedure: UPPER EXTREMITY VENOGRAPHY;  Surgeon: Algernon Huxley, MD;  Location: [place] [facility];  Service: Cardiovascular;  Laterality: Bilateral;  . VASCULAR ACCESS DEVICE INSERTION Right 11/08/2016    Procedure: INSERTION OF HERO VASCULAR ACCESS DEVICE ( GRAFT );  Surgeon: Algernon Huxley, MD;  Location: [facility];  Service: Vascular;  Laterality: Right;    Family History  Problem Relation Age of Onset  . Heart disease Mother   . Cancer Mother        ovarian  . Cancer Maternal Grandmother   . Cancer Maternal Grandfather   . Asthma Son   . Asthma Son     Social History   Socioeconomic History  . Marital status: Married    Spouse name: Not on file  . Number of children: Not on file  . Years of education: Not on file  . Highest education level: Not on file  Occupational History  . Not on file  Tobacco Use  . Smoking status: Never Smoker  . Smokeless tobacco: Never Used  Substance and Sexual Activity  . Alcohol use: No  . Drug use: No  . Sexual activity: Not on file  Other Topics Concern  . 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 Difficulty of Paying Living Expenses:   Food Insecurity:   .  Worried About Charity fundraiser in the Last Year:   . Arboriculturist in the Last Year:   Transportation Needs:   . Film/video editor (Medical):   Marland Kitchen Lack of Transportation (Non-Medical):   Physical Activity:   . Days of Exercise per Week:   . Minutes of Exercise per Session:   Stress:   . Feeling of Stress :   Social Connections:   . Frequency of Communication with Friends and Family:   . Frequency of Social Gatherings with Friends and Family:   . Attends Religious Services:   . Active Member of Clubs or Organizations:   . Attends Archivist Meetings:   Marland Kitchen Marital Status:   Intimate Partner Violence:   . Fear of Current or Ex-Partner:   . Emotionally Abused:   Marland Kitchen Physically Abused:   . Sexually Abused:     Outpatient Medications Prior to Visit  Medication Sig Dispense Refill  . amLODipine (NORVASC) 5 MG tablet Take 5 mg by mouth daily.      Marland Kitchen aspirin EC 81 MG tablet Take 81 mg by mouth daily.     . calcitRIOL (ROCALTROL) 0.5 MCG capsule Take 1 capsule (0.5 mcg total) by mouth Every Tuesday,Thursday,and Saturday with dialysis. 15 capsule 0  . carvedilol (COREG) 25 MG tablet Take 25 mg by mouth 2 (two) times daily.   0  . cloNIDine (CATAPRES) 0.2 MG tablet Take 0.2 mg by mouth 2 (two) times daily.    . hydrALAZINE (APRESOLINE) 25 MG tablet Take 1 tablet (25 mg total) by mouth 3 (three) times daily. 90 tablet 0  . lactulose (CHRONULAC) 10 GM/15ML solution Take by mouth.    . losartan (COZAAR) 100 MG tablet Take 1 tablet (100 mg total) by mouth daily. 30 tablet 0  . sevelamer carbonate (RENVELA) 800 MG tablet Take 800 mg by mouth 3 (three) times daily with meals.    . simvastatin (ZOCOR) 5 MG tablet Take 5 mg by mouth Nightly.    . calcium acetate (PHOSLO) 667 MG capsule Take 667 mg by mouth 3 (three) times daily with meals.    . cloNIDine (CATAPRES - DOSED IN MG/24 HR) 0.1 mg/24hr patch Take 0.2 mg by mouth.      No facility-administered medications prior to visit.    Allergies  Allergen Reactions  . Amlodipine Swelling    Patient reported  . Lisinopril Swelling    Facial swelling    ROS Review of Systems  Constitutional: Negative for chills and diaphoresis.  HENT: Negative.   Eyes: Negative.   Respiratory: Negative.   Cardiovascular: Negative.        Right upper extremity edema  Gastrointestinal: Negative.   Endocrine: Negative for polydipsia, polyphagia and polyuria.  Genitourinary: Negative.   Musculoskeletal: Negative.   Skin: Negative.   Neurological: Negative.   Hematological: Negative.   Psychiatric/Behavioral: Negative.  Negative for behavioral problems, confusion, sleep disturbance and suicidal ideas.      Objective:    Physical Exam  Constitutional: He is oriented to person, place, and time. He appears well-developed.  HENT:  Head: Normocephalic.  Eyes: Pupils are equal, round, and reactive to  light.  Cardiovascular: Normal rate and regular rhythm.  Pulmonary/Chest: Effort normal and breath sounds normal.  Abdominal: Soft.  Musculoskeletal:        General: Normal range of motion.  Neurological: He is alert and oriented to person, place, and time.  Skin: Skin is warm, dry and intact.  Marked  edema to right upper extremity, fisula, non tender to palpation, no drainage or signs of infection.   Psychiatric: He has a normal mood and affect. His behavior is normal. Judgment normal.    BP (!) 162/100 Comment: manually  Pulse 79   Temp 98.3 F (36.8 C) (Oral)   Resp 16   Ht 5' 11"  (1.803 m)   Wt 181 lb (82.1 kg)   SpO2 100%   BMI 25.24 kg/m  Wt Readings from Last 3 Encounters:  01/20/20 181 lb (82.1 kg)  01/01/20 178 lb 12.8 oz (81.1 kg)  11/27/19 178 lb 9.2 oz (81 kg)     Health Maintenance Due  Topic Date Due  . TETANUS/TDAP  Never done    There are no preventive care reminders to display for this patient.  Lab Results  Component Value Date   TSH 3.076 11/03/2015   Lab Results  Component Value Date   WBC 4.7 10/20/2019   HGB 9.3 (L) 10/20/2019   HCT 29.2 (L) 10/20/2019   MCV 88.5 10/20/2019   PLT 156 10/20/2019   Lab Results  Component Value Date   NA 139 10/20/2019   K 4.0 10/20/2019   CO2 29 10/20/2019   GLUCOSE 103 (H) 10/20/2019   BUN 29 (H) 10/20/2019   CREATININE 16.52 (H) 10/20/2019   BILITOT 1.4 (H) 03/12/2018   ALKPHOS 52 03/12/2018   AST 24 03/12/2018   ALT 13 (L) 03/12/2018   PROT 7.5 03/12/2018   ALBUMIN 3.0 (L) 05/04/2018   CALCIUM 9.7 10/20/2019   ANIONGAP 16 (H) 10/20/2019   No results found for: CHOL No results found for: HDL No results found for: LDLCALC No results found for: TRIG No results found for: CHOLHDL Lab Results  Component Value Date   HGBA1C 4.8 07/22/2019      Assessment & Plan:   Problem List Items Addressed This Visit      Cardiovascular and Mediastinum   Malignant hypertension - Primary      Genitourinary   End stage renal disease ([place])    Other Visit Diagnoses    Edema of right upper extremity         Malignant hypertension Blood pressure was 182/106 on arrival.  Was given clonidine 0.1 mg x 1.  Blood pressure improved to 162/100.  Patient advised to resume hydralazine upon returning home.  He expressed understanding.  Also, recommend that patient follow renal diet - cloNIDine (CATAPRES) tablet 0.1 mg  End stage renal disease ([place]) Continue dialysis as scheduled.  Also, follow-up with nephrology concerning possible clots in catheter   Edema of right upper extremity Patient's right upper extremity has been a concern over the past several months.  Right arm is visibly larger than the left.  There are no signs of ongoing infection.  Recommend close follow-up by vein and vascular.  Reviewed previous ultrasounds to evaluate for complication of vascular access for dialysis.  Right upper arm graft appeared to be patent throughout.  Patient has a hematoma in the region of the right arm.  Flow volume appears to be normal.  There appears to be no evidence of pseudoaneurysm seen in the right upper extremity.  Recommend that patient have close surveillance of right upper extremity.  There is no pain at present.  Follow-up: Return in about 3 months (around 04/20/2020) for hypertension.    Donia Pounds  APRN, MSN, FNP-C Patient [facility] [address] [place], [place] [address] [phone]

## 2020-01-22 ENCOUNTER — Other Ambulatory Visit: Payer: Self-pay

## 2020-01-22 ENCOUNTER — Encounter: Payer: Self-pay | Admitting: Cardiology

## 2020-01-22 ENCOUNTER — Ambulatory Visit (INDEPENDENT_AMBULATORY_CARE_PROVIDER_SITE_OTHER): Payer: Medicare Other | Admitting: Cardiology

## 2020-01-22 VITALS — BP 162/102 | HR 72 | Temp 97.5°F | Ht 60.0 in | Wt 179.0 lb

## 2020-01-22 DIAGNOSIS — I10 Essential (primary) hypertension: Secondary | ICD-10-CM

## 2020-01-22 DIAGNOSIS — N186 End stage renal disease: Secondary | ICD-10-CM | POA: Diagnosis not present

## 2020-01-22 DIAGNOSIS — G4733 Obstructive sleep apnea (adult) (pediatric): Secondary | ICD-10-CM

## 2020-01-22 MED ORDER — AMLODIPINE BESYLATE 5 MG PO TABS: 5.0000 mg | ORAL_TABLET | Freq: Two times a day (BID) | ORAL | 3 refills | Status: DC

## 2020-01-22 MED ORDER — HYDRALAZINE HCL 50 MG PO TABS: 50.0000 mg | ORAL_TABLET | Freq: Three times a day (TID) | ORAL | 3 refills | Status: DC

## 2020-01-22 NOTE — Patient Instructions (Signed)
Increase amlodipine to 5 mg twice a day  Continue your other medication  We will have you follow up in our Hypertension clinic

## 2020-02-04 ENCOUNTER — Other Ambulatory Visit: Payer: Self-pay | Admitting: Family Medicine

## 2020-02-11 ENCOUNTER — Encounter: Payer: Self-pay | Admitting: Cardiovascular Disease

## 2020-02-11 ENCOUNTER — Ambulatory Visit (INDEPENDENT_AMBULATORY_CARE_PROVIDER_SITE_OTHER): Payer: Medicare Other | Admitting: Cardiovascular Disease

## 2020-02-11 ENCOUNTER — Other Ambulatory Visit: Payer: Self-pay

## 2020-02-11 VITALS — BP 186/102 | HR 82 | Ht 70.0 in | Wt 180.0 lb

## 2020-02-11 DIAGNOSIS — G4733 Obstructive sleep apnea (adult) (pediatric): Secondary | ICD-10-CM

## 2020-02-11 DIAGNOSIS — I5032 Chronic diastolic (congestive) heart failure: Secondary | ICD-10-CM | POA: Diagnosis not present

## 2020-02-11 DIAGNOSIS — I1A0 Resistant hypertension: Secondary | ICD-10-CM

## 2020-02-11 DIAGNOSIS — N186 End stage renal disease: Secondary | ICD-10-CM

## 2020-02-11 DIAGNOSIS — I10 Essential (primary) hypertension: Secondary | ICD-10-CM | POA: Diagnosis not present

## 2020-02-11 DIAGNOSIS — Z992 Dependence on renal dialysis: Secondary | ICD-10-CM

## 2020-02-11 MED ORDER — VALSARTAN 320 MG PO TABS: 320.0000 mg | ORAL_TABLET | Freq: Every day | ORAL | 1 refills | Status: DC

## 2020-02-11 MED ORDER — HYDRALAZINE HCL 100 MG PO TABS: 100.0000 mg | ORAL_TABLET | Freq: Two times a day (BID) | ORAL | 1 refills | Status: DC

## 2020-02-11 NOTE — Progress Notes (Signed)
Hypertension Clinic Initial Assessment:    Date:  02/11/20  ID:  Jonathon Conley, DOB [birth year], MRN [id number]  PCP:  Tresa Garter, MD  Cardiologist:  Peter Martinique, MD  Nephrologist: Dr. Smith [facility])  Referring MD: Tresa Garter, MD   CC: Hypertension  History of Present Illness:    Jonathon Conley is a 54 y.o. male with a hx of ESRD s/p failed transplant, OSA  here to establish care in the hypertension clinic.  Jonathon Conley was first diagnosed with hypertension in 1996.  Until one year ago he reports that it was pretty well-controlled.  Jonathon Conley saw Dr. Martinique for resistant hypertension after he was referred from his nephrologist.  He developed ESRD in 1996 and had a failed transplant.  The initial was in 2001 and then failed in 2016.  He is now back on hemodialysis and is listed for a transplant at [facility].  He was previously a patient at [facility] but was dismissed due to noncompliance.  His blood pressure is consistently in the 200s.  About a year ago they raised his dry weight due to painful cramping during his dialysis sessions.  Mr. Radde has struggled with getting to his dialysis sessions regularly because of transportation issues.  He reported to Dr. Martinique that he frequently forgets to take his medications.  He started using an alarm on his phone which has been helpful.  He had a [employer] and echo 09/2019.  The Lexiscan revealed LVEF greater than 70% and no ischemia.  Echo revealed LVEF 60 to 65% with mild LVH.  His blood pressure with Dr. Martinique was 162/102.  Dr. Martinique increase amlodipine to 5 mg twice daily and referred him to advanced hypertension clinic.  He thinks this increase may have helped a little.  His blood pressure at home is around 210/110 prior to dialysis.  During his session it is around 180/100.  Overall he feels okay.  He struggles with chronic swelling in his right arm it is not painful.  He does not get formal exercise but does a lot of  work in his yard and on cars.  He denies any lower extremity edema, orthopnea, or PND.  He reports that his diet is "regular."  He does note that he has a lot of salt in his diet.  His wife cooks and they mostly eat at home.  He does not drink much caffeine.  He uses ginseng as a supplement but noticed that his blood pressure has not changed since he tried using it.  He denies any other over-the-counter medications or supplements.  Current medication schedule:  5AM Amlodipine, Clonidine 11AM:Carvedilol, Losartan, Hydralazine 7PM:Clonidine  11pm: Carvedilol  Previous antihypertensives: Lisinopril  Clonidine patch- cost too much   Past Medical History:  Diagnosis Date  . Anemia   . Asthma    childhood  . Blood transfusion without reported diagnosis   . CHF (congestive heart failure) ([place])   . Chronic diastolic heart failure ([place]) 02/28/2020  . Chronic kidney disease    Dialysis Mon, Wed, Fri  . Colitis   . COPD (chronic obstructive pulmonary disease) ([place])   . Diverticulosis 04/12/16   also seen: sigmoid and rectal erythema, path:   Marland Kitchen Dyspnea   . Heart murmur   . Hemorrhoids 03/2016   bleeding at 04/12/16 colonoscopy, treated with  APC laser. cauterization  . Hyperlipidemia   . Hypertension   . OSA (obstructive sleep apnea) 03/27/2013    Past Surgical  History:  Procedure Laterality Date  . A/V SHUNTOGRAM Right 04/17/2019   Procedure: A/V SHUNTOGRAM;  Surgeon: Algernon Huxley, MD;  Location: [place] [facility];  Service: Cardiovascular;  Laterality: Right;  . APPLICATION OF WOUND VAC Right 12/13/2016   Procedure: APPLICATION OF WOUND VAC;  Surgeon: Algernon Huxley, MD;  Location: [facility];  Service: Vascular;  Laterality: Right;  . APPLICATION OF WOUND VAC Right 12/19/2016   Procedure: APPLICATION OF WOUND VAC;  Surgeon: Algernon Huxley, MD;  Location: [facility];  Service: Vascular;  Laterality: Right;  . COLONOSCOPY N/A 04/12/2016   Procedure: COLONOSCOPY;  Surgeon: Mauri Pole, MD;   Location: [place] [facility];  Service: Endoscopy;  Laterality: N/A;  . DIALYSIS/PERMA CATHETER INSERTION N/A 02/26/2017   Procedure: Dialysis/Perma Catheter Insertion;  Surgeon: Algernon Huxley, MD;  Location: [place] [facility];  Service: Cardiovascular;  Laterality: N/A;  . DIALYSIS/PERMA CATHETER INSERTION N/A 03/26/2018   Procedure: DIALYSIS/PERMA CATHETER INSERTION;  Surgeon: Katha Cabal, MD;  Location: [place] [facility];  Service: Cardiovascular;  Laterality: N/A;  . DIALYSIS/PERMA CATHETER REMOVAL N/A 01/16/2019   Procedure: DIALYSIS/PERMA CATHETER REMOVAL;  Surgeon: Algernon Huxley, MD;  Location: [place] [facility];  Service: Cardiovascular;  Laterality: N/A;  . EXCHANGE OF A DIALYSIS CATHETER  11/20/2016   Procedure: Exchange Of A Dialysis Catheter;  Surgeon: Algernon Huxley, MD;  Location: [place] [facility];  Service: Cardiovascular;;  . fistulagrams Right   . HEMATOMA EVACUATION Right 12/13/2016   Procedure: EVACUATION HEMATOMA;  Surgeon: Algernon Huxley, MD;  Location: [facility];  Service: Vascular;  Laterality: Right;  . HEMATOMA EVACUATION Right 12/19/2016   Procedure: EVACUATIONof seroma;  Surgeon: Algernon Huxley, MD;  Location: [facility];  Service: Vascular;  Laterality: Right;  . HOT HEMOSTASIS N/A 04/12/2016   Procedure: HOT HEMOSTASIS (ARGON PLASMA COAGULATION/BICAP);  Surgeon: Mauri Pole, MD;  Location: [facility];  Service: Endoscopy;  Laterality: N/A;  . KIDNEY TRANSPLANT    . LIGATIONS OF HERO GRAFT Right 04/25/2018   Procedure: LIGATIONS OF HERO GRAFT ( REVISION );  Surgeon: Algernon Huxley, MD;  Location: [facility];  Service: Vascular;  Laterality: Right;  . PERIPHERAL VASCULAR CATHETERIZATION Bilateral 10/02/2016   Procedure: Upper Extremity Venography;  Surgeon: Algernon Huxley, MD;  Location: [place] [facility];  Service: Cardiovascular;  Laterality: Bilateral;  . PERIPHERAL VASCULAR CATHETERIZATION Right 11/08/2016   Procedure: DIALYSIS/PERMA CATHETER REMOVAL right Jugular;   Surgeon: Algernon Huxley, MD;  Location: [facility];  Service: Vascular;  Laterality: Right;  . PERIPHERAL VASCULAR CATHETERIZATION Left 11/08/2016   Procedure: DIALYSIS/PERMA CATHETER INSERTION left jugular with u/s guide and flouroscan;  Surgeon: Algernon Huxley, MD;  Location: [facility];  Service: Vascular;  Laterality: Left;  . PERIPHERAL VASCULAR CATHETERIZATION N/A 11/08/2016   Procedure: IVC FILTER INSERTION;  Surgeon: Algernon Huxley, MD;  Location: [facility];  Service: Vascular;  Laterality: N/A;  . perm a cath in Rt upper chest Right   . THORACENTESIS    . UPPER EXTREMITY ANGIOGRAPHY Right 11/28/2018   Procedure: UPPER EXTREMITY ANGIOGRAPHY;  Surgeon: Algernon Huxley, MD;  Location: [place] [facility];  Service: Cardiovascular;  Laterality: Right;  . UPPER EXTREMITY VENOGRAPHY Bilateral 11/27/2019   Procedure: UPPER EXTREMITY VENOGRAPHY;  Surgeon: Algernon Huxley, MD;  Location: [place] [facility];  Service: Cardiovascular;  Laterality: Bilateral;  . VASCULAR ACCESS DEVICE INSERTION Right 11/08/2016   Procedure: INSERTION OF HERO VASCULAR ACCESS DEVICE (  GRAFT );  Surgeon: Algernon Huxley, MD;  Location: [facility];  Service: Vascular;  Laterality: Right;    Current Medications: Current Meds  Medication Sig  . amLODipine (NORVASC) 5 MG tablet Take 1 tablet (5 mg total) by mouth in the morning and at bedtime.  Marland Kitchen aspirin EC 81 MG tablet Take 81 mg by mouth daily.   . calcitRIOL (ROCALTROL) 0.5 MCG capsule Take 1 capsule (0.5 mcg total) by mouth Every Tuesday,Thursday,and Saturday with dialysis.  Marland Kitchen carvedilol (COREG) 25 MG tablet Take 25 mg by mouth 2 (two) times daily.   . cloNIDine (CATAPRES) 0.2 MG tablet Take 0.2 mg by mouth 2 (two) times daily.  . hydrALAZINE (APRESOLINE) 100 MG tablet Take 1 tablet (100 mg total) by mouth in the morning and at bedtime.  Marland Kitchen lactulose (CHRONULAC) 10 GM/15ML solution Take by mouth.  . Methoxy PEG-Epoetin Beta (MIRCERA IJ) Mircera  . sevelamer carbonate (RENVELA) 800 MG tablet  Take 800 mg by mouth 3 (three) times daily with meals.  . simvastatin (ZOCOR) 5 MG tablet Take 5 mg by mouth Nightly.  . [DISCONTINUED] hydrALAZINE (APRESOLINE) 25 MG tablet Take 1 tablet (25 mg total) by mouth 3 (three) times daily.  . [DISCONTINUED] hydrALAZINE (APRESOLINE) 50 MG tablet Take 1 tablet (50 mg total) by mouth 3 (three) times daily. (Patient taking differently: Take 50 mg by mouth in the morning and at bedtime. )  . [DISCONTINUED] losartan (COZAAR) 100 MG tablet Take 1 tablet (100 mg total) by mouth daily.     Allergies:   Lisinopril   Social History   Socioeconomic History  . Marital status: Married    Spouse name: Not on file  . Number of children: 3  . Years of education: Not on file  . Highest education level: Not on file  Occupational History  . Not on file  Tobacco Use  . Smoking status: Never Smoker  . Smokeless tobacco: Never Used  Substance and Sexual Activity  . Alcohol use: No  . Drug use: No  . Sexual activity: Not on file  Other Topics Concern  . 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 Difficulty of Paying Living Expenses:   Food Insecurity:   . Worried About Charity fundraiser in the Last Year:   . Arboriculturist in the Last Year:   Transportation Needs:   . Film/video editor (Medical):   Marland Kitchen Lack of Transportation (Non-Medical):   Physical Activity:   . Days of Exercise per Week:   . Minutes of Exercise per Session:   Stress:   . Feeling of Stress :   Social Connections:   . Frequency of Communication with Friends and Family:   . Frequency of Social Gatherings with Friends and Family:   . Attends Religious Services:   . Active Member of Clubs or Organizations:   . Attends Archivist Meetings:   Marland Kitchen Marital Status:      Family History: The patient's family history includes Asthma in his son and son;  CAD in his mother; Cancer in his maternal grandfather, maternal grandmother, and mother; Diabetes in his maternal aunt; Heart attack in his maternal grandfather and maternal grandmother; Heart disease in his mother; Kidney failure in his maternal aunt; Lung cancer in his maternal grandfather and maternal grandmother.  ROS:   Please see  the history of present illness.    All other systems reviewed and are negative.  EKGs/Labs/Other Studies Reviewed:    EKG:  EKG is not ordered today.  The ekg ordered 01/22/20 demonstrates Sinus rhythm.  Rate 72 bpm.  QTc 462 ms.  LVH with repolarization abnormalities.    Recent Labs: 10/20/2019: BUN 29; Creatinine, Ser 16.52; Hemoglobin 9.3; Platelets 156; Potassium 4.0; Sodium 139   Recent Lipid Panel No results found for: CHOL, TRIG, HDL, CHOLHDL, VLDL, LDLCALC, LDLDIRECT  Physical Exam:    Wt Readings from Last 3 Encounters:  02/11/20 180 lb (81.6 kg)  01/22/20 179 lb (81.2 kg)  01/20/20 181 lb (82.1 kg)     VS:  BP (!) 186/102   Pulse 82   Ht 5' 10"  (1.778 m)   Wt 180 lb (81.6 kg)   SpO2 98%   BMI 25.83 kg/m  , BMI Body mass index is 25.83 kg/m. GENERAL:  Well appearing HEENT: Pupils equal round and reactive, fundi not visualized, oral mucosa unremarkable NECK:  No jugular venous distention, waveform within normal limits, carotid upstroke brisk and symmetric, no bruits LUNGS:  Clear to auscultation bilaterally HEART:  RRR.  PMI not displaced or sustained,S1 and S2 within normal limits, no S3, no S4, no clicks, no rubs, no murmurs ABD:  Flat, positive bowel sounds normal in frequency in pitch, no bruits, no rebound, no guarding, no midline pulsatile mass, no hepatomegaly, no splenomegaly EXT:  2 plus pulses throughout, no edema, no cyanosis no clubbing SKIN:  No rashes no nodules NEURO:  Cranial nerves II through XII grossly intact, motor grossly intact throughout PSYCH:  Cognitively intact, oriented to person place and time    ASSESSMENT:     1. Resistant hypertension   2. Accelerated hypertension   3. Chronic diastolic heart failure ([place])   4. Essential hypertension   5. OSA (obstructive sleep apnea)   6. ESRD on dialysis [facility])     PLAN:    # Resistant hypertension:  Mr. Mckesson has struggled with resistant hypertension.  His blood pressure is elevated despite being on many medications.  He struggles with remembering his medicines, though this seems to have improved since he started using an alarm on his phone.  His dry weight is slowly being lowered in dialysis, which will likely help as well.  We will switch losartan to valsartan 320 mg daily.  We will also increase hydralazine to 100 mg twice daily.  He understands that ginseng can be a secondary cause of hypertension, though it seems that his blood pressure elevation predated his use of ginseng.  He was provided instruction on reducing his sodium intake through following the DASH diet. He consents to be monitored in our remote patient monitoring program through [place].  he will track his blood pressure twice daily and understands that these trends will help [place] to adjust his medications as needed prior to his next appointment.  He is interested in enrolling in the PREP exercise and nutrition program through the [facility].  Continue treatment of OSA.    Disposition:    FU with MD/PharmD in 1 month    Medication Adjustments/Labs and Tests Ordered: Current medicines are reviewed at length with the patient today.  Concerns regarding medicines are outlined above.  Orders Placed This Encounter  Procedures  . Cantril's Ladder Assessment   Meds ordered this encounter  Medications  . DISCONTD: valsartan (DIOVAN) 320 MG tablet    Sig: Take 1 tablet (320 mg total) by  mouth daily.    Dispense:  90 tablet    Refill:  1    D/C LOSARTAN  . hydrALAZINE (APRESOLINE) 100 MG tablet    Sig: Take 1 tablet (100 mg total) by mouth in the morning and at bedtime.    Dispense:  180 tablet     Refill:  1    D/C PREVIOUS RX    Signed, Skeet Latch, MD  02/28/2020 6:55 PM    [place]

## 2020-02-11 NOTE — Patient Instructions (Signed)
Medication Instructions:  STOP LOSARTAN   START VALSARTAN 320 MG DAILY   INCREASE HYDRALAZINE TO 100 MG TWICE A DAY    Labwork: NONE   Testing/Procedures: NONE    Follow-Up: Your physician recommends that you schedule a follow-up appointment in: 1 MONTH WITH PHARM D 03/16/2020 AT 9:30    You will receive a phone call from the PREP exercise and nutrition program to schedule an initial assessment.   Special Instructions:   MONITOR YOUR BLOOD PRESSURE TWICE A DAY AND LOG IN VIVIFY   DASH Eating Plan DASH stands for "Dietary Approaches to Stop Hypertension." The DASH eating plan is a healthy eating plan that has been shown to reduce high blood pressure (hypertension). It may also reduce your risk for type 2 diabetes, heart disease, and stroke. The DASH eating plan may also help with weight loss. What are tips for following this plan?  General guidelines  Avoid eating more than 2,300 mg (milligrams) of salt (sodium) a day. If you have hypertension, you may need to reduce your sodium intake to 1,500 mg a day.  Limit alcohol intake to no more than 1 drink a day for nonpregnant women and 2 drinks a day for men. One drink equals 12 oz of beer, 5 oz of wine, or 1 oz of hard liquor.  Work with your health care provider to maintain a healthy body weight or to lose weight. Ask what an ideal weight is for you.  Get at least 30 minutes of exercise that causes your heart to beat faster (aerobic exercise) most days of the week. Activities may include walking, swimming, or biking.  Work with your health care provider or diet and nutrition specialist (dietitian) to adjust your eating plan to your individual calorie needs. Reading food labels   Check food labels for the amount of sodium per serving. Choose foods with less than 5 percent of the Daily Value of sodium. Generally, foods with less than 300 mg of sodium per serving fit into this eating plan.  To find whole grains, look for the word  "whole" as the first word in the ingredient list. Shopping  Buy products labeled as "low-sodium" or "no salt added."  Buy fresh foods. Avoid canned foods and premade or frozen meals. Cooking  Avoid adding salt when cooking. Use salt-free seasonings or herbs instead of table salt or sea salt. Check with your health care provider or pharmacist before using salt substitutes.  Do not fry foods. Cook foods using healthy methods such as baking, boiling, grilling, and broiling instead.  Cook with heart-healthy oils, such as olive, canola, soybean, or sunflower oil. Meal planning  Eat a balanced diet that includes: ? 5 or more servings of fruits and vegetables each day. At each meal, try to fill half of your plate with fruits and vegetables. ? Up to 6-8 servings of whole grains each day. ? Less than 6 oz of lean meat, poultry, or fish each day. A 3-oz serving of meat is about the same size as a deck of cards. One egg equals 1 oz. ? 2 servings of low-fat dairy each day. ? A serving of nuts, seeds, or beans 5 times each week. ? Heart-healthy fats. Healthy fats called Omega-3 fatty acids are found in foods such as flaxseeds and coldwater fish, like sardines, salmon, and mackerel.  Limit how much you eat of the following: ? Canned or prepackaged foods. ? Food that is high in trans fat, such as fried foods. ? Food  that is high in saturated fat, such as fatty meat. ? Sweets, desserts, sugary drinks, and other foods with added sugar. ? Full-fat dairy products.  Do not salt foods before eating.  Try to eat at least 2 vegetarian meals each week.  Eat more home-cooked food and less restaurant, buffet, and fast food.  When eating at a restaurant, ask that your food be prepared with less salt or no salt, if possible. What foods are recommended? The items listed may not be a complete list. Talk with your dietitian about what dietary choices are best for you. Grains Whole-grain or whole-wheat  bread. Whole-grain or whole-wheat pasta. Brown rice. [place]row. Bulgur. Whole-grain and low-sodium cereals. Pita bread. Low-fat, low-sodium crackers. Whole-wheat flour tortillas. Vegetables Fresh or frozen vegetables (raw, steamed, roasted, or grilled). Low-sodium or reduced-sodium tomato and vegetable juice. Low-sodium or reduced-sodium tomato sauce and tomato paste. Low-sodium or reduced-sodium canned vegetables. Fruits All fresh, dried, or frozen fruit. Canned fruit in natural juice (without added sugar). Meat and other protein foods Skinless chicken or [place]. Ground chicken or [place]. Pork with fat trimmed off. Fish and seafood. Egg whites. Dried beans, peas, or lentils. Unsalted nuts, nut butters, and seeds. Unsalted canned beans. Lean cuts of beef with fat trimmed off. Low-sodium, lean deli meat. Dairy Low-fat (1%) or fat-free (skim) milk. Fat-free, low-fat, or reduced-fat cheeses. Nonfat, low-sodium ricotta or cottage cheese. Low-fat or nonfat yogurt. Low-fat, low-sodium cheese. Fats and oils Soft margarine without trans fats. Vegetable oil. Low-fat, reduced-fat, or light mayonnaise and salad dressings (reduced-sodium). Canola, safflower, olive, soybean, and sunflower oils. Avocado. Seasoning and other foods Herbs. Spices. Seasoning mixes without salt. Unsalted popcorn and pretzels. Fat-free sweets. What foods are not recommended? The items listed may not be a complete list. Talk with your dietitian about what dietary choices are best for you. Grains Baked goods made with fat, such as croissants, muffins, or some breads. Dry pasta or rice meal packs. Vegetables Creamed or fried vegetables. Vegetables in a cheese sauce. Regular canned vegetables (not low-sodium or reduced-sodium). Regular canned tomato sauce and paste (not low-sodium or reduced-sodium). Regular tomato and vegetable juice (not low-sodium or reduced-sodium). Angie Fava. Olives. Fruits Canned fruit in a light or heavy  syrup. Fried fruit. Fruit in cream or butter sauce. Meat and other protein foods Fatty cuts of meat. Ribs. Fried meat. Berniece Salines. Sausage. Bologna and other processed lunch meats. Salami. Fatback. Hotdogs. Bratwurst. Salted nuts and seeds. Canned beans with added salt. Canned or smoked fish. Whole eggs or egg yolks. Chicken or [place] with skin. Dairy Whole or 2% milk, cream, and half-and-half. Whole or full-fat cream cheese. Whole-fat or sweetened yogurt. Full-fat cheese. Nondairy creamers. Whipped toppings. Processed cheese and cheese spreads. Fats and oils Butter. Stick margarine. Lard. Shortening. Ghee. Bacon fat. Tropical oils, such as coconut, palm kernel, or palm oil. Seasoning and other foods Salted popcorn and pretzels. Onion salt, garlic salt, seasoned salt, table salt, and sea salt. Worcestershire sauce. Tartar sauce. Barbecue sauce. Teriyaki sauce. Soy sauce, including reduced-sodium. Steak sauce. Canned and packaged gravies. Fish sauce. Oyster sauce. Cocktail sauce. Horseradish that you find on the shelf. Ketchup. Mustard. Meat flavorings and tenderizers. Bouillon cubes. Hot sauce and Tabasco sauce. Premade or packaged marinades. Premade or packaged taco seasonings. Relishes. Regular salad dressings. Where to find more information:  National Heart, Lung, and [place]: https://wilson-eaton.com/  American Heart Association: www.heart.org Summary  The DASH eating plan is a healthy eating plan that has been shown to reduce high blood pressure (  hypertension). It may also reduce your risk for type 2 diabetes, heart disease, and stroke.  With the DASH eating plan, you should limit salt (sodium) intake to 2,300 mg a day. If you have hypertension, you may need to reduce your sodium intake to 1,500 mg a day.  When on the DASH eating plan, aim to eat more fresh fruits and vegetables, whole grains, lean proteins, low-fat dairy, and heart-healthy fats.  Work with your health care provider or diet and  nutrition specialist (dietitian) to adjust your eating plan to your individual calorie needs. This information is not intended to replace advice given to you by your health care provider. Make sure you discuss any questions you have with your health care provider. Document Released: 09/21/2011 Document Revised: 09/14/2017 Document Reviewed: 09/25/2016 Elsevier Patient Education  2020 [employer].

## 2020-02-25 ENCOUNTER — Telehealth: Payer: Self-pay | Admitting: *Deleted

## 2020-02-25 MED ORDER — VALSARTAN 320 MG PO TABS: 320.0000 mg | ORAL_TABLET | Freq: Every day | ORAL | 3 refills | Status: DC

## 2020-02-25 NOTE — Telephone Encounter (Signed)
mychart message below received  [name] To  Skeet Latch, MD Sent  02/25/2020 10:55 AM  Thank you so much!  Previous Messages RE: Non-Urgent Medical Question  From  [name] To  Skeet Latch, MD Sent  02/25/2020 10:53 AM  YES! Can you call it in to [facility] on [address] in [place]. Thanks!   Sent as requested and replied to [employer]

## 2020-02-25 NOTE — Telephone Encounter (Signed)
Mychart message sent and Left message to call back to discuss Jonathon Conley $39.69 [facility] $41.50  [facility]  $51.50 Above price with Good Rx for 90 day supply   From  Jonathon Conley To  Jonathon Latch, MD Sent  02/23/2020 7:16 PM  [facility] said the Valsartan was $113 with my Medicare and Good Rx. The high price is the reason I couldn't afford the Clonidine patch. I cannot afford Valsartan at this price.  Thanks

## 2020-02-28 ENCOUNTER — Encounter: Payer: Self-pay | Admitting: Cardiovascular Disease

## 2020-02-28 DIAGNOSIS — I5032 Chronic diastolic (congestive) heart failure: Secondary | ICD-10-CM

## 2020-02-28 HISTORY — DX: Chronic diastolic (congestive) heart failure: I50.32

## 2020-03-16 ENCOUNTER — Ambulatory Visit: Payer: Medicare Other

## 2020-03-30 ENCOUNTER — Encounter (INDEPENDENT_AMBULATORY_CARE_PROVIDER_SITE_OTHER): Payer: Medicare Other | Admitting: Nurse Practitioner

## 2020-04-06 ENCOUNTER — Other Ambulatory Visit (INDEPENDENT_AMBULATORY_CARE_PROVIDER_SITE_OTHER): Payer: Self-pay | Admitting: Nurse Practitioner

## 2020-04-06 ENCOUNTER — Ambulatory Visit (INDEPENDENT_AMBULATORY_CARE_PROVIDER_SITE_OTHER): Payer: Medicare Other | Admitting: Vascular Surgery

## 2020-04-06 ENCOUNTER — Encounter (INDEPENDENT_AMBULATORY_CARE_PROVIDER_SITE_OTHER): Payer: Self-pay | Admitting: Vascular Surgery

## 2020-04-06 ENCOUNTER — Other Ambulatory Visit: Payer: Self-pay

## 2020-04-06 ENCOUNTER — Ambulatory Visit (INDEPENDENT_AMBULATORY_CARE_PROVIDER_SITE_OTHER): Payer: Medicare Other

## 2020-04-06 VITALS — BP 157/93 | HR 73 | Resp 16 | Wt 171.8 lb

## 2020-04-06 DIAGNOSIS — Z992 Dependence on renal dialysis: Secondary | ICD-10-CM

## 2020-04-06 DIAGNOSIS — N186 End stage renal disease: Secondary | ICD-10-CM

## 2020-04-06 DIAGNOSIS — E785 Hyperlipidemia, unspecified: Secondary | ICD-10-CM

## 2020-04-06 DIAGNOSIS — T148XXA Other injury of unspecified body region, initial encounter: Secondary | ICD-10-CM

## 2020-04-06 DIAGNOSIS — I10 Essential (primary) hypertension: Secondary | ICD-10-CM

## 2020-04-06 NOTE — Assessment & Plan Note (Signed)
Duplex today shows a mild hematoma around the access sites but no obvious pseudoaneurysm and no stenosis or elevated velocities are seen within the right arm hero graft.  Continue to use this access.  Rotate access sites as much as possible as he has very limited access options and we may run out of spots if we lose this graft.  Recheck in 6 months with noninvasive studies.

## 2020-04-06 NOTE — Assessment & Plan Note (Signed)
lipid control important in reducing the progression of atherosclerotic disease. Continue statin therapy  

## 2020-04-06 NOTE — Progress Notes (Signed)
MRN : [id number]  Jonathon Conley is a 54 y.o. (August 30, 1966) male who presents with chief complaint of  Chief Complaint  Patient presents with  . Follow-up    ultrasound follow up  .  History of Present Illness: Patient returns today in follow up of his dialysis access.  His arm swelling has gone down.  His access is working well without any major issues or problems at this point.  No prolonged bleeding, difficulties with access, or low flow rates.  Duplex today shows a mild hematoma around the access sites but no obvious pseudoaneurysm and no stenosis or elevated velocities are seen within the right arm hero graft  Current Outpatient Medications  Medication Sig Dispense Refill  . amLODipine (NORVASC) 5 MG tablet Take 1 tablet (5 mg total) by mouth in the morning and at bedtime. 180 tablet 3  . aspirin EC 81 MG tablet Take 81 mg by mouth daily.     . calcitRIOL (ROCALTROL) 0.5 MCG capsule Take 1 capsule (0.5 mcg total) by mouth Every Tuesday,Thursday,and Saturday with dialysis. 15 capsule 0  . carvedilol (COREG) 25 MG tablet Take 25 mg by mouth 2 (two) times daily.   0  . cloNIDine (CATAPRES) 0.2 MG tablet Take 0.2 mg by mouth 2 (two) times daily.    . hydrALAZINE (APRESOLINE) 100 MG tablet Take 1 tablet (100 mg total) by mouth in the morning and at bedtime. 180 tablet 1  . lactulose (CHRONULAC) 10 GM/15ML solution Take by mouth.    . Methoxy PEG-Epoetin Beta (MIRCERA IJ) Mircera    . sevelamer carbonate (RENVELA) 800 MG tablet Take 800 mg by mouth 3 (three) times daily with meals.    . simvastatin (ZOCOR) 5 MG tablet Take 5 mg by mouth Nightly.    . valsartan (DIOVAN) 320 MG tablet Take 1 tablet (320 mg total) by mouth daily. 90 tablet 3   No current facility-administered medications for this visit.    Past Medical History:  Diagnosis Date  . Anemia   . Asthma    childhood  . Blood transfusion without reported diagnosis   . CHF (congestive heart failure) ([place])   . Chronic  diastolic heart failure ([place]) 02/28/2020  . Chronic kidney disease    Dialysis Mon, Wed, Fri  . Colitis   . COPD (chronic obstructive pulmonary disease) ([place])   . Diverticulosis 04/12/16   also seen: sigmoid and rectal erythema, path:   Marland Kitchen Dyspnea   . Heart murmur   . Hemorrhoids 03/2016   bleeding at 04/12/16 colonoscopy, treated with  APC laser. cauterization  . Hyperlipidemia   . Hypertension   . OSA (obstructive sleep apnea) 03/27/2013    Past Surgical History:  Procedure Laterality Date  . A/V SHUNTOGRAM Right 04/17/2019   Procedure: A/V SHUNTOGRAM;  Surgeon: Algernon Huxley, MD;  Location: [facility];  Service: Cardiovascular;  Laterality: Right;  . APPLICATION OF WOUND VAC Right 12/13/2016   Procedure: APPLICATION OF WOUND VAC;  Surgeon: Algernon Huxley, MD;  Location: [facility];  Service: Vascular;  Laterality: Right;  . APPLICATION OF WOUND VAC Right 12/19/2016   Procedure: APPLICATION OF WOUND VAC;  Surgeon: Algernon Huxley, MD;  Location: [facility];  Service: Vascular;  Laterality: Right;  . COLONOSCOPY N/A 04/12/2016   Procedure: COLONOSCOPY;  Surgeon: Mauri Pole, MD;  Location: [facility];  Service: Endoscopy;  Laterality: N/A;  . DIALYSIS/PERMA CATHETER INSERTION N/A 02/26/2017   Procedure: Dialysis/Perma Catheter Insertion;  Surgeon:  Algernon Huxley, MD;  Location: [facility];  Service: Cardiovascular;  Laterality: N/A;  . DIALYSIS/PERMA CATHETER INSERTION N/A 03/26/2018   Procedure: DIALYSIS/PERMA CATHETER INSERTION;  Surgeon: Katha Cabal, MD;  Location: [facility];  Service: Cardiovascular;  Laterality: N/A;  . DIALYSIS/PERMA CATHETER REMOVAL N/A 01/16/2019   Procedure: DIALYSIS/PERMA CATHETER REMOVAL;  Surgeon: Algernon Huxley, MD;  Location: [facility];  Service: Cardiovascular;  Laterality: N/A;  . EXCHANGE OF A DIALYSIS CATHETER  11/20/2016   Procedure: Exchange Of A Dialysis Catheter;  Surgeon: Algernon Huxley, MD;  Location: [facility];  Service: Cardiovascular;;  . fistulagrams Right   . HEMATOMA EVACUATION Right 12/13/2016   Procedure: EVACUATION HEMATOMA;  Surgeon: Algernon Huxley, MD;  Location: [facility];  Service: Vascular;  Laterality: Right;  . HEMATOMA EVACUATION Right 12/19/2016   Procedure: EVACUATIONof seroma;  Surgeon: Algernon Huxley, MD;  Location: [facility];  Service: Vascular;  Laterality: Right;  . HOT HEMOSTASIS N/A 04/12/2016   Procedure: HOT HEMOSTASIS (ARGON PLASMA COAGULATION/BICAP);  Surgeon: Mauri Pole, MD;  Location: [facility];  Service: Endoscopy;  Laterality: N/A;  . KIDNEY TRANSPLANT    . LIGATIONS OF HERO GRAFT Right 04/25/2018   Procedure: LIGATIONS OF HERO GRAFT ( REVISION );  Surgeon: Algernon Huxley, MD;  Location: [facility];  Service: Vascular;  Laterality: Right;  . PERIPHERAL VASCULAR CATHETERIZATION Bilateral 10/02/2016   Procedure: Upper Extremity Venography;  Surgeon: Algernon Huxley, MD;  Location: [facility];  Service: Cardiovascular;  Laterality: Bilateral;  . PERIPHERAL VASCULAR CATHETERIZATION Right 11/08/2016   Procedure: DIALYSIS/PERMA CATHETER REMOVAL right Jugular;  Surgeon: Algernon Huxley, MD;  Location: [facility];  Service: Vascular;  Laterality: Right;  . PERIPHERAL VASCULAR CATHETERIZATION Left 11/08/2016   Procedure: DIALYSIS/PERMA CATHETER INSERTION left jugular with u/s guide and flouroscan;  Surgeon: Algernon Huxley, MD;  Location: [facility];  Service: Vascular;  Laterality: Left;  . PERIPHERAL VASCULAR CATHETERIZATION N/A 11/08/2016   Procedure: IVC FILTER INSERTION;  Surgeon: Algernon Huxley, MD;  Location: [facility];  Service: Vascular;  Laterality: N/A;  . perm a cath in Rt upper chest Right   . THORACENTESIS    . UPPER EXTREMITY ANGIOGRAPHY Right 11/28/2018   Procedure: UPPER EXTREMITY ANGIOGRAPHY;  Surgeon: Algernon Huxley, MD;  Location: [place] [facility];  Service: Cardiovascular;  Laterality: Right;  . UPPER EXTREMITY VENOGRAPHY Bilateral 11/27/2019   Procedure: UPPER  EXTREMITY VENOGRAPHY;  Surgeon: Algernon Huxley, MD;  Location: [place] [facility];  Service: Cardiovascular;  Laterality: Bilateral;  . VASCULAR ACCESS DEVICE INSERTION Right 11/08/2016   Procedure: INSERTION OF HERO VASCULAR ACCESS DEVICE ( GRAFT );  Surgeon: Algernon Huxley, MD;  Location: [facility];  Service: Vascular;  Laterality: Right;     Social History   Tobacco Use  . Smoking status: Never Smoker  . Smokeless tobacco: Never Used  Vaping Use  . Vaping Use: Never used  Substance Use Topics  . Alcohol use: No  . Drug use: No      Family History  Problem Relation Age of Onset  . Heart disease Mother   . Cancer Mother        ovarian  . CAD Mother   . Cancer Maternal Grandmother   . Heart attack Maternal Grandmother   . Lung cancer Maternal Grandmother   . Cancer Maternal Grandfather   . Heart attack Maternal Grandfather   . Lung cancer  Maternal Grandfather   . Asthma Son   . Asthma Son   . Kidney failure Maternal Aunt   . Diabetes Maternal Aunt      Allergies  Allergen Reactions  . Lisinopril Swelling    Facial swelling     REVIEW OF SYSTEMS (Negative unless checked)  Constitutional: [] Weight loss  [] Fever  [] Chills Cardiac: [] Chest pain   [] Chest pressure   [] Palpitations   [] Shortness of breath when laying flat   [] Shortness of breath at rest   [] Shortness of breath with exertion. Vascular:  [] Pain in legs with walking   [] Pain in legs at rest   [] Pain in legs when laying flat   [] Claudication   [] Pain in feet when walking  [] Pain in feet at rest  [] Pain in feet when laying flat   [] History of DVT   [] Phlebitis   [] Swelling in legs   [] Varicose veins   [] Non-healing ulcers Pulmonary:   [] Uses home oxygen   [] Productive cough   [] Hemoptysis   [] Wheeze  [] COPD   [] Asthma Neurologic:  [] Dizziness  [] Blackouts   [] Seizures   [] History of stroke   [] History of TIA  [] Aphasia   [] Temporary blindness   [] Dysphagia   [] Weakness or numbness in arms   [] Weakness or numbness  in legs Musculoskeletal:  [] Arthritis   [] Joint swelling   [] Joint pain   [] Low back pain Hematologic:  [] Easy bruising  [] Easy bleeding   [] Hypercoagulable state   [x] Anemic   Gastrointestinal:  [] Blood in stool   [] Vomiting blood  [] Gastroesophageal reflux/heartburn   [] Abdominal pain Genitourinary:  [x] Chronic kidney disease   [] Difficult urination  [] Frequent urination  [] Burning with urination   [] Hematuria Skin:  [] Rashes   [] Ulcers   [] Wounds Psychological:  [] History of anxiety   []  History of major depression.  Physical Examination  BP (!) 157/93 (BP Location: Left Arm)   Pulse 73   Resp 16   Wt 171 lb 12.8 oz (77.9 kg)   BMI 24.65 kg/m  Gen:  WD/WN, NAD Head: [place]/AT, No temporalis wasting. Ear/Nose/Throat: Hearing grossly intact, nares w/o erythema or drainage Eyes: Conjunctiva clear. Sclera non-icteric Neck: Supple.  Trachea midline Pulmonary:  Good air movement, no use of accessory muscles.  Cardiac: RRR, no JVD Vascular: Soft thrill is present in the right upper arm hero graft.  Very mild right arm swelling present at this point. Vessel Right Left  Radial  not palpable  1+ palpable                   Musculoskeletal: M/S 5/5 throughout.  No deformity or atrophy.  Very mild right upper arm edema. Neurologic: Sensation grossly intact in extremities.  Symmetrical.  Speech is fluent.  Psychiatric: Judgment intact, Mood & affect appropriate for pt's clinical situation. Dermatologic: No rashes or ulcers noted.  No cellulitis or open wounds.       Labs No results found for this or any previous visit (from the past 2160 hour(s)).  Radiology No results found.  Assessment/Plan  Malignant hypertension blood pressure control important in reducing the progression of atherosclerotic disease. On appropriate oral medications.   Hyperlipidemia lipid control important in reducing the progression of atherosclerotic disease. Continue statin therapy   ESRD on dialysis  [facility]) Duplex today shows a mild hematoma around the access sites but no obvious pseudoaneurysm and no stenosis or elevated velocities are seen within the right arm hero graft.  Continue to use this access.  Rotate access sites as much as  possible as he has very limited access options and we may run out of spots if we lose this graft.  Recheck in 6 months with noninvasive studies.    Leotis Pain, MD  04/06/2020 4:45 PM    This note was created with Dragon medical transcription system.  Any errors from dictation are purely unintentional

## 2020-04-06 NOTE — Assessment & Plan Note (Signed)
blood pressure control important in reducing the progression of atherosclerotic disease. On appropriate oral medications.  

## 2020-04-15 ENCOUNTER — Other Ambulatory Visit: Payer: Self-pay

## 2020-04-15 ENCOUNTER — Ambulatory Visit (INDEPENDENT_AMBULATORY_CARE_PROVIDER_SITE_OTHER): Payer: Medicare Other | Admitting: Pharmacist

## 2020-04-15 VITALS — BP 132/84 | Resp 16 | Ht 70.0 in | Wt 171.4 lb

## 2020-04-15 DIAGNOSIS — I10 Essential (primary) hypertension: Secondary | ICD-10-CM

## 2020-04-15 NOTE — Progress Notes (Signed)
Patient ID: Jonathon Conley                 DOB: 1966-01-06                      MRN: [id number]     HPI:  Jonathon Conley is a 54 y.o. male referred by Dr. Oval Linsey to HTN clinic. PMH includes ESRD, resistance hypertension, OSA, HF, COPD, and hyperlipidemia. Noted patient not using vivify and not currently on PREP. He reports compliance with medication and dialysis. Not using CPAP at the moment , while waiting for supplies.   Current HTN meds:  Amlodipine 27m twice daily Carvedilol 255mtwice daily Hydralazine 10093mwice daily Clonidine 0.2mg22mice daily (4am & 1pm)  Valsartan 320mg19mly  Previously tried:  Lisinopril - swelling Clonidine patch - costly  BP goal: <130/80  Family History: The patient's family history includes Asthma in his son and son; CAD in his mother; Cancer in his maternal grandfather, maternal grandmother, and mother; Diabetes in his maternal aunt; Heart attack in his maternal grandfather and maternal grandmother; Heart disease in his mother; Kidney failure in his maternal aunt; Lung cancer in his maternal grandfather and maternal grandmother.  Social History: denies tobacco and aocohol  Diet:conitnues on reduced sodium diet, but still taking supplement with ginver  Exercise: PREP - following  with [facility]  Home BP readings: none in vivify since 02/2020 and no records provided today either  Wt Readings from Last 3 Encounters:  04/15/20 171 lb 6.4 oz (77.7 kg)  04/06/20 171 lb 12.8 oz (77.9 kg)  02/11/20 180 lb (81.6 kg)   BP Readings from Last 3 Encounters:  04/15/20 132/84  04/06/20 (!) 157/93  02/11/20 (!) 186/102   Pulse Readings from Last 3 Encounters:  04/06/20 73  02/11/20 82  01/22/20 72    Past Medical History:  Diagnosis Date  . Anemia   . Asthma    childhood  . Blood transfusion without reported diagnosis   . CHF (congestive heart failure) (HCC) [place] Chronic diastolic heart failure (HCC) [place]5/2021  . Chronic kidney disease    Dialysis Mon,  Wed, Fri  . Colitis   . COPD (chronic obstructive pulmonary disease) (HCC) [place] Diverticulosis 04/12/16   also seen: sigmoid and rectal erythema, path:   . DysMarland Kitchennea   . Heart murmur   . Hemorrhoids 03/2016   bleeding at 04/12/16 colonoscopy, treated with  APC laser. cauterization  . Hyperlipidemia   . Hypertension   . OSA (obstructive sleep apnea) 03/27/2013    Current Outpatient Medications on File Prior to Visit  Medication Sig Dispense Refill  . amLODipine (NORVASC) 5 MG tablet Take 1 tablet (5 mg total) by mouth in the morning and at bedtime. 180 tablet 3  . aspirin EC 81 MG tablet Take 81 mg by mouth daily.     . calcitRIOL (ROCALTROL) 0.5 MCG capsule Take 1 capsule (0.5 mcg total) by mouth Every Tuesday,Thursday,and Saturday with dialysis. 15 capsule 0  . carvedilol (COREG) 25 MG tablet Take 25 mg by mouth 2 (two) times daily.   0  . cloNIDine (CATAPRES) 0.2 MG tablet Take 0.2 mg by mouth 2 (two) times daily.    . hydrALAZINE (APRESOLINE) 100 MG tablet Take 1 tablet (100 mg total) by mouth in the morning and at bedtime. 180 tablet 1  . lactulose (CHRONULAC) 10 GM/15ML solution Take by mouth.    . Methoxy PEG-Epoetin Beta (MIRCERA IJ)  Mircera    . sevelamer carbonate (RENVELA) 800 MG tablet Take 800 mg by mouth 3 (three) times daily with meals.    . simvastatin (ZOCOR) 5 MG tablet Take 5 mg by mouth Nightly.    . valsartan (DIOVAN) 320 MG tablet Take 1 tablet (320 mg total) by mouth daily. 90 tablet 3   No current facility-administered medications on file prior to visit.    Allergies  Allergen Reactions  . Lisinopril Swelling    Facial swelling    Blood pressure 132/84, resp. rate 16, height 5' 10"  (1.778 m), weight 171 lb 6.4 oz (77.7 kg).  HTN (hypertension) Blood pressure greatly improved and patient reports compliance with all medication and dialysis therapy. Will continue current regimen without changes , but will try to work with insurance to determine if able to afford  clonidine patches. Plan to follow up in 4-5 weeks with HTN clinic and 3 months with Dr. Oval Linsey.    Claretta Kendra Rodriguez-Guzman PharmD, BCPS, [place] [address] 04/15/2020 4:37 PM

## 2020-04-15 NOTE — Assessment & Plan Note (Signed)
Blood pressure greatly improved and patient reports compliance with all medication and dialysis therapy. Will continue current regimen without changes , but will try to work with insurance to determine if able to afford clonidine patches. Plan to follow up in 4-5 weeks with HTN clinic and 3 months with Dr. Oval Linsey.

## 2020-04-15 NOTE — Patient Instructions (Addendum)
Return for a  follow up appointment in 5 weeks  Check your blood pressure at home daily (if able) and keep record of the readings.  Take your BP meds as follows: *NO CHANGE AT THIS TIME* *WILL TRY TO GET CLODINE 0.2MG PATCH APPROVED*  Bring all of your meds, your BP cuff and your record of home blood pressures to your next appointment.  Exercise as youre able, try to walk approximately 30 minutes per day.  Keep salt intake to a minimum, especially watch canned and prepared boxed foods.  Eat more fresh fruits and vegetables and fewer canned items.  Avoid eating in fast food restaurants.    HOW TO TAKE YOUR BLOOD PRESSURE:  Rest 5 minutes before taking your blood pressure.   Dont smoke or drink caffeinated beverages for at least 30 minutes before.  Take your blood pressure before (not after) you eat.  Sit comfortably with your back supported and both feet on the floor (dont cross your legs).  Elevate your arm to heart level on a table or a desk.  Use the proper sized cuff. It should fit smoothly and snugly around your bare upper arm. There should be enough room to slip a fingertip under the cuff. The bottom edge of the cuff should be 1 inch above the crease of the elbow.  Ideally, take 3 measurements at one sitting and record the average.

## 2020-04-20 ENCOUNTER — Ambulatory Visit: Payer: Medicare Other | Admitting: Family Medicine

## 2020-05-14 ENCOUNTER — Telehealth: Payer: Self-pay

## 2020-05-14 NOTE — Telephone Encounter (Signed)
Called patient to discuss inactivity of BP checks in [place]. Left message for patient to return call.

## 2020-05-17 ENCOUNTER — Telehealth: Payer: Self-pay

## 2020-05-17 DIAGNOSIS — Z0000 Encounter for general adult medical examination without abnormal findings: Secondary | ICD-10-CM

## 2020-05-17 NOTE — Telephone Encounter (Signed)
Called to follow up with patient regarding BP checks with Vivify. Left message for patient to return call to [phone].

## 2020-05-21 ENCOUNTER — Telehealth: Payer: Self-pay

## 2020-05-21 DIAGNOSIS — Z0000 Encounter for general adult medical examination without abnormal findings: Secondary | ICD-10-CM

## 2020-05-21 NOTE — Telephone Encounter (Signed)
Called patient to follow up with BP checks with Viv[facility]. Left patient a message to return call to Care Guide at [phone].

## 2020-05-25 ENCOUNTER — Telehealth: Payer: Self-pay

## 2020-05-25 DIAGNOSIS — Z0000 Encounter for general adult medical examination without abnormal findings: Secondary | ICD-10-CM

## 2020-05-25 NOTE — Telephone Encounter (Signed)
Called patient to discuss inactivity of BP checks in [place]. Left message for patient to return call to Care Guide at [phone].

## 2020-05-26 NOTE — Progress Notes (Deleted)
Patient ID: Jonathon Conley                 DOB: 1966/01/03                      MRN: [id number]     HPI:  Jonathon Conley is a 54 y.o. male referred by Dr. Oval Linsey to HTN clinic. PMH includes ESRD, resistance hypertension, OSA, HF, COPD, and hyperlipidemia. Noted patient not using vivify and not currently on PREP. He reports compliance with medication and dialysis. Not using CPAP at the moment , while waiting for supplies.  At his last visit in [facility] his pressure was good at 132/84.  Medications were left unchanged at that time.    Current HTN meds:  Amlodipine 53m twice daily Carvedilol 228mtwice daily Hydralazine 10073mwice daily Clonidine 0.2mg72mice daily (4am & 1pm)  Valsartan 320mg14mly  Previously tried:  Lisinopril - swelling Clonidine patch - costly  BP goal: <130/80  Family History: The patient's family history includes Asthma in his son and son; CAD in his mother; Cancer in his maternal grandfather, maternal grandmother, and mother; Diabetes in his maternal aunt; Heart attack in his maternal grandfather and maternal grandmother; Heart disease in his mother; Kidney failure in his maternal aunt; Lung cancer in his maternal grandfather and maternal grandmother.  Social History: denies tobacco and aocohol  Diet:conitnues on reduced sodium diet, but still taking supplement with ginver  Exercise: PREP - following  with [facility]  Home BP readings: none in vivify since 02/2020 and no records provided today either  Wt Readings from Last 3 Encounters:  04/15/20 171 lb 6.4 oz (77.7 kg)  04/06/20 171 lb 12.8 oz (77.9 kg)  02/11/20 180 lb (81.6 kg)   BP Readings from Last 3 Encounters:  04/15/20 132/84  04/06/20 (!) 157/93  02/11/20 (!) 186/102   Pulse Readings from Last 3 Encounters:  04/06/20 73  02/11/20 82  01/22/20 72    Past Medical History:  Diagnosis Date  . Anemia   . Asthma    childhood  . Blood transfusion without reported diagnosis   . CHF (congestive heart  failure) (HCC) [place] Chronic diastolic heart failure (HCC) [place]5/2021  . Chronic kidney disease    Dialysis Mon, Wed, Fri  . Colitis   . COPD (chronic obstructive pulmonary disease) (HCC) [place] Diverticulosis 04/12/16   also seen: sigmoid and rectal erythema, path:   . DysMarland Kitchennea   . Heart murmur   . Hemorrhoids 03/2016   bleeding at 04/12/16 colonoscopy, treated with  APC laser. cauterization  . Hyperlipidemia   . Hypertension   . OSA (obstructive sleep apnea) 03/27/2013    Current Outpatient Medications on File Prior to Visit  Medication Sig Dispense Refill  . amLODipine (NORVASC) 5 MG tablet Take 1 tablet (5 mg total) by mouth in the morning and at bedtime. 180 tablet 3  . aspirin EC 81 MG tablet Take 81 mg by mouth daily.     . calcitRIOL (ROCALTROL) 0.5 MCG capsule Take 1 capsule (0.5 mcg total) by mouth Every Tuesday,Thursday,and Saturday with dialysis. 15 capsule 0  . carvedilol (COREG) 25 MG tablet Take 25 mg by mouth 2 (two) times daily.   0  . cloNIDine (CATAPRES) 0.2 MG tablet Take 0.2 mg by mouth 2 (two) times daily.    . hydrALAZINE (APRESOLINE) 100 MG tablet Take 1 tablet (100 mg total) by mouth in the morning and at bedtime.  180 tablet 1  . lactulose (CHRONULAC) 10 GM/15ML solution Take by mouth.    . Methoxy PEG-Epoetin Beta (MIRCERA IJ) Mircera    . sevelamer carbonate (RENVELA) 800 MG tablet Take 800 mg by mouth 3 (three) times daily with meals.    . simvastatin (ZOCOR) 5 MG tablet Take 5 mg by mouth Nightly.    . valsartan (DIOVAN) 320 MG tablet Take 1 tablet (320 mg total) by mouth daily. 90 tablet 3   No current facility-administered medications on file prior to visit.    Allergies  Allergen Reactions  . Lisinopril Swelling    Facial swelling    There were no vitals taken for this visit.  No problem-specific Assessment & Plan notes found for this encounter.    Raquel Rodriguez-Guzman PharmD, BCPS, [place] [address] 05/26/2020 3:49 PM

## 2020-05-27 ENCOUNTER — Ambulatory Visit: Payer: Medicare Other

## 2020-05-31 ENCOUNTER — Telehealth: Payer: Self-pay

## 2020-05-31 DIAGNOSIS — Z0000 Encounter for general adult medical examination without abnormal findings: Secondary | ICD-10-CM

## 2020-05-31 NOTE — Telephone Encounter (Signed)
Called patient to discuss lack of BP checks with Vivify. Left patient message to call Care Guide at [phone].

## 2020-06-08 ENCOUNTER — Ambulatory Visit: Payer: Medicare Other

## 2020-06-08 NOTE — Progress Notes (Deleted)
Patient ID: ARLAN BIRKS                 DOB: [birth year]                      MRN: [id number]     HPI:  Jonathon Conley is a 54 y.o. male referred by Dr. Oval Linsey to HTN clinic. PMH includes ESRD, resistance hypertension, OSA, HF, COPD, and hyperlipidemia. Noted patient not using vivify and not currently on PREP. He reports compliance with medication and dialysis. Not using CPAP at the moment , while waiting for supplies.   Last vivify entry was 110 days ago (note from 8/11 - patient received transplant)  Current HTN meds:  Amlodipine 63m twice daily Carvedilol 267mtwice daily Hydralazine 10045mwice daily Clonidine 0.2mg64mice daily (4am & 1pm)  Valsartan 320mg56mly  Previously tried:  Lisinopril - swelling Clonidine patch - costly  BP goal: <130/80  Family History: The patient's family history includes Asthma in his son and son; CAD in his mother; Cancer in his maternal grandfather, maternal grandmother, and mother; Diabetes in his maternal aunt; Heart attack in his maternal grandfather and maternal grandmother; Heart disease in his mother; Kidney failure in his maternal aunt; Lung cancer in his maternal grandfather and maternal grandmother.  Social History: denies tobacco and aocohol  Diet:conitnues on reduced sodium diet, but still taking supplement with ginver  Exercise: PREP - following  with [facility]  Home BP readings: none in vivify since 02/2020 and no records provided today either  Wt Readings from Last 3 Encounters:  04/15/20 171 lb 6.4 oz (77.7 kg)  04/06/20 171 lb 12.8 oz (77.9 kg)  02/11/20 180 lb (81.6 kg)   BP Readings from Last 3 Encounters:  04/15/20 132/84  04/06/20 (!) 157/93  02/11/20 (!) 186/102   Pulse Readings from Last 3 Encounters:  04/06/20 73  02/11/20 82  01/22/20 72    Past Medical History:  Diagnosis Date  . Anemia   . Asthma    childhood  . Blood transfusion without reported diagnosis   . CHF (congestive heart failure) (HCC) [place] Chronic  diastolic heart failure (HCC) [place]5/2021  . Chronic kidney disease    Dialysis Mon, Wed, Fri  . Colitis   . COPD (chronic obstructive pulmonary disease) (HCC) [place] Diverticulosis 04/12/16   also seen: sigmoid and rectal erythema, path:   . DysMarland Kitchennea   . Heart murmur   . Hemorrhoids 03/2016   bleeding at 04/12/16 colonoscopy, treated with  APC laser. cauterization  . Hyperlipidemia   . Hypertension   . OSA (obstructive sleep apnea) 03/27/2013    Current Outpatient Medications on File Prior to Visit  Medication Sig Dispense Refill  . amLODipine (NORVASC) 5 MG tablet Take 1 tablet (5 mg total) by mouth in the morning and at bedtime. 180 tablet 3  . aspirin EC 81 MG tablet Take 81 mg by mouth daily.     . calcitRIOL (ROCALTROL) 0.5 MCG capsule Take 1 capsule (0.5 mcg total) by mouth Every Tuesday,Thursday,and Saturday with dialysis. 15 capsule 0  . carvedilol (COREG) 25 MG tablet Take 25 mg by mouth 2 (two) times daily.   0  . cloNIDine (CATAPRES) 0.2 MG tablet Take 0.2 mg by mouth 2 (two) times daily.    . hydrALAZINE (APRESOLINE) 100 MG tablet Take 1 tablet (100 mg total) by mouth in the morning and at bedtime. 180 tablet 1  . lactulose ([place]  10 GM/15ML solution Take by mouth.    . Methoxy PEG-Epoetin Beta (MIRCERA IJ) Mircera    . sevelamer carbonate (RENVELA) 800 MG tablet Take 800 mg by mouth 3 (three) times daily with meals.    . simvastatin (ZOCOR) 5 MG tablet Take 5 mg by mouth Nightly.    . valsartan (DIOVAN) 320 MG tablet Take 1 tablet (320 mg total) by mouth daily. 90 tablet 3   No current facility-administered medications on file prior to visit.    Allergies  Allergen Reactions  . Lisinopril Swelling    Facial swelling    There were no vitals taken for this visit.  No problem-specific Assessment & Plan notes found for this encounter.    Chennel Olivos Rodriguez-Guzman PharmD, BCPS, [place] [address] 06/08/2020  11:44 AM

## 2020-07-06 NOTE — Addendum Note (Signed)
Addended by: Mike Craze on: 07/06/2020 11:49 AM   Modules accepted: Orders

## 2020-09-03 ENCOUNTER — Telehealth: Payer: Self-pay

## 2020-09-03 DIAGNOSIS — Z0000 Encounter for general adult medical examination without abnormal findings: Secondary | ICD-10-CM

## 2020-09-03 NOTE — Telephone Encounter (Signed)
Called patient to inform him that he know longer had to track his BP with the Vivify app, but to keep a paper log of his BP for follow up appointments. Left patient a message to return call to Care Guide.

## 2020-09-06 ENCOUNTER — Telehealth: Payer: Self-pay | Admitting: Internal Medicine

## 2020-09-06 NOTE — Telephone Encounter (Signed)
Advised [facility] seeking medical records that the patient had an appt in 2019 but the patient was never seen in the office.

## 2020-09-15 NOTE — Telephone Encounter (Signed)
Copied from [place] [id number]. Topic: Medical Record Request - Other >> Sep 06, 2020  3:33 PM Alanda Slim E wrote: Patient Name/DOB/MRN #: Jonathon Conley [birth year]/ mrn [id number] Requestor Name/Agency: [facility]  Call Back #: [phone] Information Requested: Colonscopy report with pathology report within the last 5 years / request faxed to office today    Route to [facility] for [place] clinics. For all other clinics, route to the clinic's PEC Pool.

## 2020-09-15 NOTE — Telephone Encounter (Signed)
Copied from [place] [id number]. Topic: Medical Record Request - Other >> Sep 06, 2020  3:33 PM Alanda Slim E wrote: Patient Name/DOB/MRN #: Jonathon Conley [birth year]/ mrn [id number] Requestor Name/Agency: [facility]  Call Back #: [phone] Information Requested: Colonscopy report with pathology report within the last 5 years / request faxed to office today    Route to [facility] for [place] clinics. For all other clinics, route to the clinic's PEC Pool.

## 2020-09-15 NOTE — Telephone Encounter (Signed)
I have not received any medical record request from [facility]. Please reach out to the facility and request records to be sent to the office at [phone].  Thank you

## 2020-09-16 ENCOUNTER — Telehealth: Payer: Self-pay

## 2020-09-16 NOTE — Telephone Encounter (Signed)
Copied from [place] [id number]. Topic: Medical Record Request - Other >> Sep 06, 2020  3:33 PM Alanda Slim E wrote: Patient Name/DOB/MRN #: Jonathon Conley [birth year]/ mrn [id number] Requestor Name/Agency: [facility]  Call Back #: [phone] Information Requested: Colonscopy report with pathology report within the last 5 years / request faxed to office today    Route to [facility] for [place] clinics. For all other clinics, route to the clinic's PEC Pool.   Patient will need to contact the hospital at [phone] to request coloncscopy records since the were done in the hospital. Will will not be able to release those records. We can only release records from office visits at the clinic.

## 2020-09-17 NOTE — Telephone Encounter (Signed)
Patient has never been seen here. Notified [facility].

## 2020-10-05 ENCOUNTER — Ambulatory Visit (INDEPENDENT_AMBULATORY_CARE_PROVIDER_SITE_OTHER): Payer: Medicare Other | Admitting: Nurse Practitioner

## 2020-10-05 ENCOUNTER — Encounter (INDEPENDENT_AMBULATORY_CARE_PROVIDER_SITE_OTHER): Payer: Medicare Other

## 2020-10-05 ENCOUNTER — Encounter (INDEPENDENT_AMBULATORY_CARE_PROVIDER_SITE_OTHER): Payer: Self-pay

## 2020-10-07 ENCOUNTER — Other Ambulatory Visit: Payer: Self-pay

## 2020-10-07 ENCOUNTER — Encounter (HOSPITAL_COMMUNITY): Payer: Self-pay | Admitting: Emergency Medicine

## 2020-10-07 ENCOUNTER — Emergency Department (HOSPITAL_COMMUNITY)
Admission: EM | Admit: 2020-10-07 | Discharge: 2020-10-07 | Disposition: A | Discharge status: Home / Self Care | Payer: Medicare Other | Attending: Emergency Medicine | Admitting: Emergency Medicine

## 2020-10-07 DIAGNOSIS — R000 Tachycardia, unspecified: Secondary | ICD-10-CM | POA: Diagnosis present

## 2020-10-07 DIAGNOSIS — J45909 Unspecified asthma, uncomplicated: Secondary | ICD-10-CM | POA: Insufficient documentation

## 2020-10-07 DIAGNOSIS — Z79899 Other long term (current) drug therapy: Secondary | ICD-10-CM | POA: Diagnosis not present

## 2020-10-07 DIAGNOSIS — Z992 Dependence on renal dialysis: Secondary | ICD-10-CM | POA: Insufficient documentation

## 2020-10-07 DIAGNOSIS — I471 Supraventricular tachycardia: Secondary | ICD-10-CM | POA: Diagnosis not present

## 2020-10-07 DIAGNOSIS — Z20822 Contact with and (suspected) exposure to covid-19: Secondary | ICD-10-CM | POA: Diagnosis not present

## 2020-10-07 DIAGNOSIS — I132 Hypertensive heart and chronic kidney disease with heart failure and with stage 5 chronic kidney disease, or end stage renal disease: Secondary | ICD-10-CM | POA: Insufficient documentation

## 2020-10-07 DIAGNOSIS — N186 End stage renal disease: Secondary | ICD-10-CM | POA: Diagnosis not present

## 2020-10-07 DIAGNOSIS — J449 Chronic obstructive pulmonary disease, unspecified: Secondary | ICD-10-CM | POA: Insufficient documentation

## 2020-10-07 DIAGNOSIS — Z7982 Long term (current) use of aspirin: Secondary | ICD-10-CM | POA: Diagnosis not present

## 2020-10-07 DIAGNOSIS — I5033 Acute on chronic diastolic (congestive) heart failure: Secondary | ICD-10-CM | POA: Diagnosis not present

## 2020-10-07 DIAGNOSIS — I10 Essential (primary) hypertension: Secondary | ICD-10-CM

## 2020-10-07 LAB — CBC WITH DIFFERENTIAL/PLATELET
Abs Immature Granulocytes: 0 10*3/uL (ref 0.00–0.07)
Basophils Absolute: 0 10*3/uL (ref 0.0–0.1)
Basophils Relative: 1 %
Eosinophils Absolute: 0.1 10*3/uL (ref 0.0–0.5)
Eosinophils Relative: 3 %
HCT: 31.5 % — ABNORMAL LOW (ref 39.0–52.0)
Hemoglobin: 9.8 g/dL — ABNORMAL LOW (ref 13.0–17.0)
Lymphocytes Relative: 11 %
Lymphs Abs: 0.4 10*3/uL — ABNORMAL LOW (ref 0.7–4.0)
MCH: 26.8 pg (ref 26.0–34.0)
MCHC: 31.1 g/dL (ref 30.0–36.0)
MCV: 86.3 fL (ref 80.0–100.0)
Monocytes Absolute: 0.3 10*3/uL (ref 0.1–1.0)
Monocytes Relative: 10 %
Neutro Abs: 2.5 10*3/uL (ref 1.7–7.7)
Neutrophils Relative %: 75 %
Platelets: 107 10*3/uL — ABNORMAL LOW (ref 150–400)
RBC: 3.65 MIL/uL — ABNORMAL LOW (ref 4.22–5.81)
RDW: 24 % — ABNORMAL HIGH (ref 11.5–15.5)
WBC: 3.3 10*3/uL — ABNORMAL LOW (ref 4.0–10.5)
nRBC: 0 % (ref 0.0–0.2)
nRBC: 1 /100 WBC — ABNORMAL HIGH

## 2020-10-07 LAB — BASIC METABOLIC PANEL
Anion gap: 13 (ref 5–15)
BUN: 36 mg/dL — ABNORMAL HIGH (ref 6–20)
CO2: 24 mmol/L (ref 22–32)
Calcium: 8.2 mg/dL — ABNORMAL LOW (ref 8.9–10.3)
Chloride: 104 mmol/L (ref 98–111)
Creatinine, Ser: 11.84 mg/dL — ABNORMAL HIGH (ref 0.61–1.24)
GFR, Estimated: 5 mL/min — ABNORMAL LOW (ref >60)
Glucose, Bld: 78 mg/dL (ref 70–99)
Potassium: 4.3 mmol/L (ref 3.5–5.1)
Sodium: 141 mmol/L (ref 135–145)

## 2020-10-07 LAB — RESP PANEL BY RT-PCR (FLU A&B, COVID) ARPGX2
Influenza A by PCR: NEGATIVE
Influenza B by PCR: NEGATIVE
SARS Coronavirus 2 by RT PCR: NEGATIVE

## 2020-10-07 LAB — MAGNESIUM: Magnesium: 2.3 mg/dL (ref 1.7–2.4)

## 2020-10-07 MED ORDER — CLONIDINE HCL 0.2 MG PO TABS
0.2000 mg | ORAL_TABLET | Freq: Once | ORAL | Status: AC
  Administered 2020-10-07: 19:00:00 0.2 mg via ORAL
  Filled 2020-10-07: qty 1

## 2020-10-07 MED ORDER — CARVEDILOL 3.125 MG PO TABS
3.1250 mg | ORAL_TABLET | Freq: Two times a day (BID) | ORAL | Status: DC
  Administered 2020-10-07: 19:00:00 3.125 mg via ORAL
  Filled 2020-10-07: qty 1

## 2020-10-07 MED ORDER — HYDRALAZINE HCL 25 MG PO TABS
100.0000 mg | ORAL_TABLET | Freq: Once | ORAL | Status: AC
  Administered 2020-10-07: 19:00:00 100 mg via ORAL
  Filled 2020-10-07: qty 4

## 2020-10-07 NOTE — ED Triage Notes (Signed)
Pt BIB [facility] from home, c/o palpitations in his chest, shortness of breath and diaphoresis, on EMS arrival pt in SVT with a rate of 162, given 28m adenosine and pt converted to NSR. Dialysis Tues/Thurs/Sat, completed dialysis today.

## 2020-10-07 NOTE — Discharge Instructions (Signed)
Tonight you should take your valsartan and amlodipine for your blood pressure.  Your other meds were given in the ER.  Tomorrow you should start taking all your normal medications for blood pressure.

## 2020-10-07 NOTE — ED Notes (Signed)
Patient denies pain and is resting comfortably.  

## 2020-10-07 NOTE — ED Provider Notes (Signed)
[place] EMERGENCY DEPARTMENT Provider Note   CSN: [id number] Arrival date & time: 10/07/20  1623     History Chief Complaint  Patient presents with  . Tachycardia    Jonathon Conley is a 54 y.o. male with a history of end-stage renal disease status post renal transplant (now in failure, no longer on immunosuppresive meds), on dialysis Tues, Thurs, Sat, presenting to the emergency department from dialysis with SVT.  Patient reportedly began having palpitations and feeling unwell halfway through dialysis.  EMS reports that heart rate of 160 that appeared to be in SVT on scene, gave the patient 6 mg of adenosine, with spontaneous resolution of his tachycardia since his arrival in the ED.  The patient reports that he feels much better since his arrival.  He denies any chest pressure or shortness of breath.  He reports he is an uric.  He states he has extremely difficult to control hypertension and is on multiple medications for that.  HPI     Past Medical History:  Diagnosis Date  . Anemia   . Asthma    childhood  . Blood transfusion without reported diagnosis   . CHF (congestive heart failure) ([place])   . Chronic diastolic heart failure ([place]) 02/28/2020  . Chronic kidney disease    Dialysis Mon, Wed, Fri  . Colitis   . COPD (chronic obstructive pulmonary disease) ([place])   . Diverticulosis 04/12/16   also seen: sigmoid and rectal erythema, path:   Marland Kitchen Dyspnea   . Heart murmur   . Hemorrhoids 03/2016   bleeding at 04/12/16 colonoscopy, treated with  APC laser. cauterization  . Hyperlipidemia   . Hypertension   . OSA (obstructive sleep apnea) 03/27/2013    Patient Active Problem List   Diagnosis Date Noted  . Chronic diastolic heart failure ([place]) 02/28/2020  . Ulcerative pancolitis with rectal bleeding ([place]) 07/22/2019  . Acute respiratory distress 05/02/2018  . Chest pain 05/02/2018  . Acute CHF (congestive heart failure) ([place]) 04/09/2018  . FUO (fever of  unknown origin) 03/13/2018  . Viral respiratory infection 03/05/2018  . Cough 11/14/2017  . Lower respiratory infection 11/14/2017  . History of fever 11/14/2017  . 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 SIRS (systemic inflammatory response syndrome) ([place]) 12/19/2016  . End stage renal disease ([place]) 12/19/2016  . Hyponatremia 12/19/2016  . Anemia of chronic disease 12/19/2016  . Postprocedural seroma of a musculoskeletal structure following other procedure 12/17/2016  . Secondary hyperparathyroidism of renal origin ([place]) 10/15/2016  . Abnormal results of liver function studies 04/27/2016  . Hematochezia 04/08/2016  . HTN (hypertension) 04/08/2016  . Intravenous line infection ([place]) 04/08/2016  . Insomnia 01/04/2016  . Malignant hypertension 12/14/2015  . Pain, unspecified 12/14/2015  . Symptomatic anemia 11/03/2015  . Chronic anemia 11/03/2015  . Thrombocytopenia ([place]) 11/03/2015  . Pleural effusion on right 11/03/2015  . Shortness of breath 11/02/2015  . Kidney transplant failure 10/17/2015  . Sepsis ([place]) 09/23/2015  . ESRD on dialysis ([place]) 09/22/2015  . Accelerated hypertension 09/22/2015  . Leg edema, left   . HCAP (healthcare-associated pneumonia) 09/21/2015  . Hypertensive chronic kidney disease with stage 5 chronic kidney disease or end stage renal disease ([place]) 09/15/2015  . Moderate protein-calorie malnutrition ([place]) 09/10/2015  . Encounter for immunization 09/06/2015  . Bacterial infection, unspecified 08/24/2015  . Iron deficiency anemia, unspecified 08/24/2015  . Bacteremia due to Gram-positive bacteria 08/20/2015  . Acute blood loss anemia 08/12/2015  . C.  difficile colitis 08/12/2015  . Bleeding gastrointestinal 08/12/2015  . Acute-on-chronic renal failure ([place]) 08/10/2015  . H/O kidney transplant 08/08/2015  . Non compliance with medical treatment 08/08/2015  . History of anemia 08/08/2015  . AKI (acute kidney injury) ([place])  02/14/2015  . Renal transplant recipient 02/14/2015  . Acute on chronic diastolic congestive heart failure ([place]) 02/14/2015  . Anemia 02/14/2015  . Hyperlipidemia   . OSA (obstructive sleep apnea) 03/27/2013    Past Surgical History:  Procedure Laterality Date  . A/V SHUNTOGRAM Right 04/17/2019   Procedure: A/V SHUNTOGRAM;  Surgeon: Algernon Huxley, MD;  Location: [place] [facility] CV LAB;  Service: Cardiovascular;  Laterality: Right;  . APPLICATION OF WOUND VAC Right 12/13/2016   Procedure: APPLICATION OF WOUND VAC;  Surgeon: Algernon Huxley, MD;  Location: [facility] ORS;  Service: Vascular;  Laterality: Right;  . APPLICATION OF WOUND VAC Right 12/19/2016   Procedure: APPLICATION OF WOUND VAC;  Surgeon: Algernon Huxley, MD;  Location: [facility] ORS;  Service: Vascular;  Laterality: Right;  . COLONOSCOPY N/A 04/12/2016   Procedure: COLONOSCOPY;  Surgeon: Mauri Pole, MD;  Location: [place] ENDOSCOPY;  Service: Endoscopy;  Laterality: N/A;  . DIALYSIS/PERMA CATHETER INSERTION N/A 02/26/2017   Procedure: Dialysis/Perma Catheter Insertion;  Surgeon: Algernon Huxley, MD;  Location: [place] [facility] LAB;  Service: Cardiovascular;  Laterality: N/A;  . DIALYSIS/PERMA CATHETER INSERTION N/A 03/26/2018   Procedure: DIALYSIS/PERMA CATHETER INSERTION;  Surgeon: Katha Cabal, MD;  Location: [place] CV LAB;  Service: Cardiovascular;  Laterality: N/A;  . DIALYSIS/PERMA CATHETER REMOVAL N/A 01/16/2019   Procedure: DIALYSIS/PERMA CATHETER REMOVAL;  Surgeon: Algernon Huxley, MD;  Location: [place] CV LAB;  Service: Cardiovascular;  Laterality: N/A;  . EXCHANGE OF A DIALYSIS CATHETER  11/20/2016   Procedure: Exchange Of A Dialysis Catheter;  Surgeon: Algernon Huxley, MD;  Location: [place] CV LAB;  Service: Cardiovascular;;  . fistulagrams Right   . HEMATOMA EVACUATION Right 12/13/2016   Procedure: EVACUATION HEMATOMA;  Surgeon: Algernon Huxley, MD;  Location: [facility];  Service: Vascular;  Laterality: Right;  . HEMATOMA  EVACUATION Right 12/19/2016   Procedure: EVACUATIONof seroma;  Surgeon: Algernon Huxley, MD;  Location: [facility] OR[facility];  Service: Vascular;  Laterality: Right;  . HOT HEMOSTASIS N/A 04/12/2016   Procedure: HOT HEMOSTASIS (ARGON PLASMA COAGULATION/BICAP);  Surgeon: Mauri Pole, MD;  Location: [facility]COPY;  Service: Endoscopy;  Laterality: N/A;  . KIDNEY TRANSPLANT    . LIGATIONS OF HERO GRAFT Right 04/25/2018   Procedure: LIGATIONS OF HERO GRAFT ( REVISION );  Surgeon: Algernon Huxley, MD;  Location: [facility] OR[facility];  Service: Vascular;  Laterality: Right;  . PERIPHERAL VASCULAR CATHETERIZATION Bilateral 10/02/2016   Procedure: Upper Extremity Venography;  Surgeon: Algernon Huxley, MD;  Location: [place] CV LAB;  Service: Cardiovascular;  Laterality: Bilateral;  . PERIPHERAL VASCULAR CATHETERIZATION Right 11/08/2016   Procedure: DIALYSIS/PERMA CATHETER REMOVAL right Jugular;  Surgeon: Algernon Huxley, MD;  Location: [facility] OR[facility];  Service: Vascular;  Laterality: Right;  . PERIPHERAL VASCULAR CATHETERIZATION Left 11/08/2016   Procedure: DIALYSIS/PERMA CATHETER INSERTION left jugular with u/s guide and flouroscan;  Surgeon: Algernon Huxley, MD;  Location: [facility];  Service: Vascular;  Laterality: Left;  . PERIPHERAL VASCULAR CATHETERIZATION N/A 11/08/2016   Procedure: IVC FILTER INSERTION;  Surgeon: Algernon Huxley, MD;  Location: [facility];  Service: Vascular;  Laterality: N/A;  . perm a cath in Rt upper chest Right   . THORACENTESIS    .  UPPER EXTREMITY ANGIOGRAPHY Right 11/28/2018   Procedure: UPPER EXTREMITY ANGIOGRAPHY;  Surgeon: Algernon Huxley, MD;  Location: [place] CV LAB;  Service: Cardiovascular;  Laterality: Right;  . UPPER EXTREMITY VENOGRAPHY Bilateral 11/27/2019   Procedure: UPPER EXTREMITY VENOGRAPHY;  Surgeon: Algernon Huxley, MD;  Location: [place] CV LAB;  Service: Cardiovascular;  Laterality: Bilateral;  . VASCULAR ACCESS DEVICE INSERTION Right 11/08/2016   Procedure: INSERTION OF HERO VASCULAR ACCESS  DEVICE ( GRAFT );  Surgeon: Algernon Huxley, MD;  Location: [facility] OR[facility];  Service: Vascular;  Laterality: Right;       Family History  Problem Relation Age of Onset  . Heart disease Mother   . Cancer Mother        ovarian  . CAD Mother   . Cancer Maternal Grandmother   . Heart attack Maternal Grandmother   . Lung cancer Maternal Grandmother   . Cancer Maternal Grandfather   . Heart attack Maternal Grandfather   . Lung cancer Maternal Grandfather   . Asthma Son   . Asthma Son   . Kidney failure Maternal Aunt   . Diabetes Maternal Aunt     Social History   Tobacco Use  . Smoking status: Never Smoker  . Smokeless tobacco: Never Used  Vaping Use  . Vaping Use: Never used  Substance Use Topics  . Alcohol use: No  . Drug use: No    Home Medications Prior to Admission medications   Medication Sig Start Date End Date Taking? Authorizing Provider  amLODipine (NORVASC) 5 MG tablet Take 1 tablet (5 mg total) by mouth in the morning and at bedtime. 01/22/20   [place], Peter M, MD  aspirin EC 81 MG tablet Take 81 mg by mouth daily.     [provider]  calcitRIOL (ROCALTROL) 0.5 MCG capsule Take 1 capsule (0.5 mcg total) by mouth Every Tuesday,Thursday,and Saturday with dialysis. 09/24/15   Orson Eva, MD  carvedilol (COREG) 25 MG tablet Take 25 mg by mouth 2 (two) times daily.     [provider]  cloNIDine (CATAPRES) 0.2 MG tablet Take 0.2 mg by mouth 2 (two) times daily. 12/26/19   [provider]  hydrALAZINE (APRESOLINE) 100 MG tablet Take 1 tablet (100 mg total) by mouth in the morning and at bedtime. 02/11/20 02/05/21  Skeet Latch, MD  lactulose [facility]) 10 GM/15ML solution Take by mouth. 10/08/19   [provider]  Methoxy PEG-Epoetin Beta (MIRCERA IJ) Mircera 01/07/20 01/05/21  [provider]  sevelamer carbonate (RENVELA) 800 MG tablet Take 800 mg by mouth 3 (three) times daily with meals.    [provider]  simvastatin  (ZOCOR) 5 MG tablet Take 5 mg by mouth Nightly. 11/21/17   [provider]  valsartan (DIOVAN) 320 MG tablet Take 1 tablet (320 mg total) by mouth daily. 02/25/20   Skeet Latch, MD    Allergies    Lisinopril  Review of Systems   Review of Systems  Constitutional: Positive for fatigue. Negative for chills and fever.  Eyes: Negative for pain and visual disturbance.  Respiratory: Negative for cough and shortness of breath.   Cardiovascular: Positive for palpitations. Negative for chest pain.  Gastrointestinal: Negative for abdominal pain and vomiting.  Musculoskeletal: Negative for arthralgias and back pain.  Skin: Negative for color change and rash.  Neurological: Negative for syncope and numbness.  All other systems reviewed and are negative.   Physical Exam Updated Vital Signs BP (!) 169/120   Pulse 82  Temp 98.5 F (36.9 C) (Oral)   Resp 16   SpO2 96%   Physical Exam Vitals and nursing note reviewed.  Constitutional:      Appearance: He is well-developed and well-nourished.  HENT:     Head: Normocephalic and atraumatic.  Eyes:     Conjunctiva/sclera: Conjunctivae normal.  Cardiovascular:     Rate and Rhythm: Normal rate and regular rhythm.     Pulses: Normal pulses.  Pulmonary:     Effort: Pulmonary effort is normal. No respiratory distress.     Breath sounds: Normal breath sounds.  Abdominal:     Palpations: Abdomen is soft.     Tenderness: There is no abdominal tenderness.  Musculoskeletal:        General: No edema.     Cervical back: Neck supple.  Skin:    General: Skin is warm and dry.  Neurological:     Mental Status: He is alert.  Psychiatric:        Mood and Affect: Mood and affect normal.     ED Results / Procedures / Treatments   Labs (all labs ordered are listed, but only abnormal results are displayed) Labs Reviewed  BASIC METABOLIC PANEL - Abnormal; Notable for the following components:      Result Value   BUN 36 (*)     Creatinine, Ser 11.84 (*)    Calcium 8.2 (*)    GFR, Estimated 5 (*)    All other components within normal limits  CBC WITH DIFFERENTIAL/PLATELET - Abnormal; Notable for the following components:   WBC 3.3 (*)    RBC 3.65 (*)    Hemoglobin 9.8 (*)    HCT 31.5 (*)    RDW 24.0 (*)    Platelets 107 (*)    Lymphs Abs 0.4 (*)    nRBC 1 (*)    All other components within normal limits  RESP PANEL BY RT-PCR (FLU A&B, COVID) ARPGX2  MAGNESIUM    EKG EKG Interpretation  Date/Time:  Thursday October 07 2020 16:24:42 EST Ventricular Rate:  90 PR Interval:    QRS Duration: 84 QT Interval:  396 QTC Calculation: 485 R Axis:   31 Text Interpretation: Sinus rhythm Probable left atrial enlargement Borderline T abnormalities, lateral leads Borderline prolonged QT interval NO STEMI Confirmed by Octaviano Glow [phone]) on 10/07/2020 4:34:51 PM   Radiology No results found.  Procedures Procedures (including critical care time)  Medications Ordered in ED Medications  hydrALAZINE (APRESOLINE) tablet 100 mg (100 mg Oral Given 10/07/20 1844)  cloNIDine (CATAPRES) tablet 0.2 mg (0.2 mg Oral Given 10/07/20 1844)    ED Course  I have reviewed the triage vital signs and the nursing notes.  Pertinent labs & imaging results that were available during my care of the patient were reviewed by me and considered in my medical decision making (see chart for details).  This patient presents with concern for SVT during dialysis, now resolved after 6 mg adenosine from EMS.  He is found to have significant HTN today and tells me he struggles with BP for a long time- he's on 5 medications for this, and hasn't taken his morning meds today.  I ordered, reviewed, and interpreted labs, which included CBC near his baseline levels, covid/flu negative, K normal 4.3, Mg normal 2.3 I ordered medication PO home meds for HTN His ECG here shows NSR without acute ischemic findings  After the interventions stated  above, I reevaluated the patient and found improvement of BP.  Discussed  which meds to complete tonight and encouraged resumption of all home BP meds in the morning.  Telemetry rhythm has been stable here.  Okay for discharge.   Clinical Course as of 10/08/20 0952  Thu Oct 07, 2020  [place] BP meds ordered [MT]  1842 K normal, Mg normal. [MT]    Clinical Course User Index [MT] Alaira Level, Carola Rhine, MD    Final Clinical Impression(s) / ED Diagnoses Final diagnoses:  SVT (supraventricular tachycardia) ([place])  Hypertension, unspecified type    Rx / DC Orders ED Discharge Orders    None       Wyvonnia Dusky, MD 10/08/20 [phone]

## 2020-10-07 NOTE — ED Notes (Signed)
Patient discharged, vetrbalized understanding of DC instructions.  ambulatory to [facility] with family   0/ 10 pain  PIV removed

## 2020-10-18 DIAGNOSIS — N186 End stage renal disease: Secondary | ICD-10-CM | POA: Diagnosis not present

## 2020-10-18 DIAGNOSIS — Z992 Dependence on renal dialysis: Secondary | ICD-10-CM | POA: Diagnosis not present

## 2020-10-18 DIAGNOSIS — N2581 Secondary hyperparathyroidism of renal origin: Secondary | ICD-10-CM | POA: Diagnosis not present

## 2020-10-20 DIAGNOSIS — N186 End stage renal disease: Secondary | ICD-10-CM | POA: Diagnosis not present

## 2020-10-20 DIAGNOSIS — Z992 Dependence on renal dialysis: Secondary | ICD-10-CM | POA: Diagnosis not present

## 2020-10-20 DIAGNOSIS — N2581 Secondary hyperparathyroidism of renal origin: Secondary | ICD-10-CM | POA: Diagnosis not present

## 2020-10-22 DIAGNOSIS — N186 End stage renal disease: Secondary | ICD-10-CM | POA: Diagnosis not present

## 2020-10-22 DIAGNOSIS — Z992 Dependence on renal dialysis: Secondary | ICD-10-CM | POA: Diagnosis not present

## 2020-10-22 DIAGNOSIS — N2581 Secondary hyperparathyroidism of renal origin: Secondary | ICD-10-CM | POA: Diagnosis not present

## 2020-10-25 DIAGNOSIS — N186 End stage renal disease: Secondary | ICD-10-CM | POA: Diagnosis not present

## 2020-10-25 DIAGNOSIS — N2581 Secondary hyperparathyroidism of renal origin: Secondary | ICD-10-CM | POA: Diagnosis not present

## 2020-10-25 DIAGNOSIS — Z992 Dependence on renal dialysis: Secondary | ICD-10-CM | POA: Diagnosis not present

## 2020-10-27 DIAGNOSIS — Z992 Dependence on renal dialysis: Secondary | ICD-10-CM | POA: Diagnosis not present

## 2020-10-27 DIAGNOSIS — N2581 Secondary hyperparathyroidism of renal origin: Secondary | ICD-10-CM | POA: Diagnosis not present

## 2020-10-27 DIAGNOSIS — N186 End stage renal disease: Secondary | ICD-10-CM | POA: Diagnosis not present

## 2020-10-29 DIAGNOSIS — Z992 Dependence on renal dialysis: Secondary | ICD-10-CM | POA: Diagnosis not present

## 2020-10-29 DIAGNOSIS — N186 End stage renal disease: Secondary | ICD-10-CM | POA: Diagnosis not present

## 2020-10-29 DIAGNOSIS — N2581 Secondary hyperparathyroidism of renal origin: Secondary | ICD-10-CM | POA: Diagnosis not present

## 2020-11-01 DIAGNOSIS — N2581 Secondary hyperparathyroidism of renal origin: Secondary | ICD-10-CM | POA: Diagnosis not present

## 2020-11-01 DIAGNOSIS — Z992 Dependence on renal dialysis: Secondary | ICD-10-CM | POA: Diagnosis not present

## 2020-11-01 DIAGNOSIS — N186 End stage renal disease: Secondary | ICD-10-CM | POA: Diagnosis not present

## 2020-11-03 DIAGNOSIS — N186 End stage renal disease: Secondary | ICD-10-CM | POA: Diagnosis not present

## 2020-11-03 DIAGNOSIS — Z992 Dependence on renal dialysis: Secondary | ICD-10-CM | POA: Diagnosis not present

## 2020-11-03 DIAGNOSIS — N2581 Secondary hyperparathyroidism of renal origin: Secondary | ICD-10-CM | POA: Diagnosis not present

## 2020-11-05 DIAGNOSIS — N186 End stage renal disease: Secondary | ICD-10-CM | POA: Diagnosis not present

## 2020-11-05 DIAGNOSIS — N2581 Secondary hyperparathyroidism of renal origin: Secondary | ICD-10-CM | POA: Diagnosis not present

## 2020-11-05 DIAGNOSIS — Z992 Dependence on renal dialysis: Secondary | ICD-10-CM | POA: Diagnosis not present

## 2020-11-08 DIAGNOSIS — N2581 Secondary hyperparathyroidism of renal origin: Secondary | ICD-10-CM | POA: Diagnosis not present

## 2020-11-08 DIAGNOSIS — Z992 Dependence on renal dialysis: Secondary | ICD-10-CM | POA: Diagnosis not present

## 2020-11-08 DIAGNOSIS — N186 End stage renal disease: Secondary | ICD-10-CM | POA: Diagnosis not present

## 2020-11-10 DIAGNOSIS — N2581 Secondary hyperparathyroidism of renal origin: Secondary | ICD-10-CM | POA: Diagnosis not present

## 2020-11-10 DIAGNOSIS — Z992 Dependence on renal dialysis: Secondary | ICD-10-CM | POA: Diagnosis not present

## 2020-11-10 DIAGNOSIS — N186 End stage renal disease: Secondary | ICD-10-CM | POA: Diagnosis not present

## 2020-11-12 DIAGNOSIS — N2581 Secondary hyperparathyroidism of renal origin: Secondary | ICD-10-CM | POA: Diagnosis not present

## 2020-11-12 DIAGNOSIS — Z992 Dependence on renal dialysis: Secondary | ICD-10-CM | POA: Diagnosis not present

## 2020-11-12 DIAGNOSIS — N186 End stage renal disease: Secondary | ICD-10-CM | POA: Diagnosis not present

## 2020-11-15 DIAGNOSIS — T8610 Unspecified complication of kidney transplant: Secondary | ICD-10-CM | POA: Diagnosis not present

## 2020-11-15 DIAGNOSIS — Z992 Dependence on renal dialysis: Secondary | ICD-10-CM | POA: Diagnosis not present

## 2020-11-15 DIAGNOSIS — N186 End stage renal disease: Secondary | ICD-10-CM | POA: Diagnosis not present

## 2020-11-17 DIAGNOSIS — N2581 Secondary hyperparathyroidism of renal origin: Secondary | ICD-10-CM | POA: Diagnosis not present

## 2020-11-17 DIAGNOSIS — Z992 Dependence on renal dialysis: Secondary | ICD-10-CM | POA: Diagnosis not present

## 2020-11-17 DIAGNOSIS — N186 End stage renal disease: Secondary | ICD-10-CM | POA: Diagnosis not present

## 2020-11-19 DIAGNOSIS — N186 End stage renal disease: Secondary | ICD-10-CM | POA: Diagnosis not present

## 2020-11-19 DIAGNOSIS — Z992 Dependence on renal dialysis: Secondary | ICD-10-CM | POA: Diagnosis not present

## 2020-11-19 DIAGNOSIS — N2581 Secondary hyperparathyroidism of renal origin: Secondary | ICD-10-CM | POA: Diagnosis not present

## 2020-11-22 DIAGNOSIS — N186 End stage renal disease: Secondary | ICD-10-CM | POA: Diagnosis not present

## 2020-11-22 DIAGNOSIS — Z992 Dependence on renal dialysis: Secondary | ICD-10-CM | POA: Diagnosis not present

## 2020-11-22 DIAGNOSIS — N2581 Secondary hyperparathyroidism of renal origin: Secondary | ICD-10-CM | POA: Diagnosis not present

## 2020-11-24 DIAGNOSIS — Z992 Dependence on renal dialysis: Secondary | ICD-10-CM | POA: Diagnosis not present

## 2020-11-24 DIAGNOSIS — N2581 Secondary hyperparathyroidism of renal origin: Secondary | ICD-10-CM | POA: Diagnosis not present

## 2020-11-24 DIAGNOSIS — N186 End stage renal disease: Secondary | ICD-10-CM | POA: Diagnosis not present

## 2020-11-26 DIAGNOSIS — N186 End stage renal disease: Secondary | ICD-10-CM | POA: Diagnosis not present

## 2020-11-26 DIAGNOSIS — Z992 Dependence on renal dialysis: Secondary | ICD-10-CM | POA: Diagnosis not present

## 2020-11-26 DIAGNOSIS — N2581 Secondary hyperparathyroidism of renal origin: Secondary | ICD-10-CM | POA: Diagnosis not present

## 2020-11-29 DIAGNOSIS — N186 End stage renal disease: Secondary | ICD-10-CM | POA: Diagnosis not present

## 2020-11-29 DIAGNOSIS — Z992 Dependence on renal dialysis: Secondary | ICD-10-CM | POA: Diagnosis not present

## 2020-11-29 DIAGNOSIS — N2581 Secondary hyperparathyroidism of renal origin: Secondary | ICD-10-CM | POA: Diagnosis not present

## 2020-12-01 DIAGNOSIS — N186 End stage renal disease: Secondary | ICD-10-CM | POA: Diagnosis not present

## 2020-12-01 DIAGNOSIS — Z992 Dependence on renal dialysis: Secondary | ICD-10-CM | POA: Diagnosis not present

## 2020-12-01 DIAGNOSIS — N2581 Secondary hyperparathyroidism of renal origin: Secondary | ICD-10-CM | POA: Diagnosis not present

## 2020-12-03 DIAGNOSIS — N2581 Secondary hyperparathyroidism of renal origin: Secondary | ICD-10-CM | POA: Diagnosis not present

## 2020-12-03 DIAGNOSIS — N186 End stage renal disease: Secondary | ICD-10-CM | POA: Diagnosis not present

## 2020-12-03 DIAGNOSIS — Z992 Dependence on renal dialysis: Secondary | ICD-10-CM | POA: Diagnosis not present

## 2020-12-06 DIAGNOSIS — Z992 Dependence on renal dialysis: Secondary | ICD-10-CM | POA: Diagnosis not present

## 2020-12-06 DIAGNOSIS — N2581 Secondary hyperparathyroidism of renal origin: Secondary | ICD-10-CM | POA: Diagnosis not present

## 2020-12-06 DIAGNOSIS — N186 End stage renal disease: Secondary | ICD-10-CM | POA: Diagnosis not present

## 2020-12-08 DIAGNOSIS — N2581 Secondary hyperparathyroidism of renal origin: Secondary | ICD-10-CM | POA: Diagnosis not present

## 2020-12-08 DIAGNOSIS — N186 End stage renal disease: Secondary | ICD-10-CM | POA: Diagnosis not present

## 2020-12-08 DIAGNOSIS — Z992 Dependence on renal dialysis: Secondary | ICD-10-CM | POA: Diagnosis not present

## 2020-12-10 DIAGNOSIS — N2581 Secondary hyperparathyroidism of renal origin: Secondary | ICD-10-CM | POA: Diagnosis not present

## 2020-12-10 DIAGNOSIS — N186 End stage renal disease: Secondary | ICD-10-CM | POA: Diagnosis not present

## 2020-12-10 DIAGNOSIS — Z992 Dependence on renal dialysis: Secondary | ICD-10-CM | POA: Diagnosis not present

## 2020-12-13 DIAGNOSIS — T8610 Unspecified complication of kidney transplant: Secondary | ICD-10-CM | POA: Diagnosis not present

## 2020-12-13 DIAGNOSIS — N186 End stage renal disease: Secondary | ICD-10-CM | POA: Diagnosis not present

## 2020-12-13 DIAGNOSIS — N2581 Secondary hyperparathyroidism of renal origin: Secondary | ICD-10-CM | POA: Diagnosis not present

## 2020-12-13 DIAGNOSIS — Z992 Dependence on renal dialysis: Secondary | ICD-10-CM | POA: Diagnosis not present

## 2020-12-15 DIAGNOSIS — Z992 Dependence on renal dialysis: Secondary | ICD-10-CM | POA: Diagnosis not present

## 2020-12-15 DIAGNOSIS — N2581 Secondary hyperparathyroidism of renal origin: Secondary | ICD-10-CM | POA: Diagnosis not present

## 2020-12-15 DIAGNOSIS — N186 End stage renal disease: Secondary | ICD-10-CM | POA: Diagnosis not present

## 2020-12-17 DIAGNOSIS — N2581 Secondary hyperparathyroidism of renal origin: Secondary | ICD-10-CM | POA: Diagnosis not present

## 2020-12-17 DIAGNOSIS — Z992 Dependence on renal dialysis: Secondary | ICD-10-CM | POA: Diagnosis not present

## 2020-12-17 DIAGNOSIS — N186 End stage renal disease: Secondary | ICD-10-CM | POA: Diagnosis not present

## 2020-12-20 DIAGNOSIS — N186 End stage renal disease: Secondary | ICD-10-CM | POA: Diagnosis not present

## 2020-12-20 DIAGNOSIS — Z992 Dependence on renal dialysis: Secondary | ICD-10-CM | POA: Diagnosis not present

## 2020-12-20 DIAGNOSIS — N2581 Secondary hyperparathyroidism of renal origin: Secondary | ICD-10-CM | POA: Diagnosis not present

## 2020-12-22 DIAGNOSIS — N2581 Secondary hyperparathyroidism of renal origin: Secondary | ICD-10-CM | POA: Diagnosis not present

## 2020-12-22 DIAGNOSIS — N186 End stage renal disease: Secondary | ICD-10-CM | POA: Diagnosis not present

## 2020-12-22 DIAGNOSIS — Z992 Dependence on renal dialysis: Secondary | ICD-10-CM | POA: Diagnosis not present

## 2020-12-24 DIAGNOSIS — N186 End stage renal disease: Secondary | ICD-10-CM | POA: Diagnosis not present

## 2020-12-24 DIAGNOSIS — Z992 Dependence on renal dialysis: Secondary | ICD-10-CM | POA: Diagnosis not present

## 2020-12-24 DIAGNOSIS — N2581 Secondary hyperparathyroidism of renal origin: Secondary | ICD-10-CM | POA: Diagnosis not present

## 2020-12-27 DIAGNOSIS — N2581 Secondary hyperparathyroidism of renal origin: Secondary | ICD-10-CM | POA: Diagnosis not present

## 2020-12-27 DIAGNOSIS — Z992 Dependence on renal dialysis: Secondary | ICD-10-CM | POA: Diagnosis not present

## 2020-12-27 DIAGNOSIS — N186 End stage renal disease: Secondary | ICD-10-CM | POA: Diagnosis not present

## 2020-12-31 DIAGNOSIS — Z992 Dependence on renal dialysis: Secondary | ICD-10-CM | POA: Diagnosis not present

## 2020-12-31 DIAGNOSIS — N186 End stage renal disease: Secondary | ICD-10-CM | POA: Diagnosis not present

## 2020-12-31 DIAGNOSIS — N2581 Secondary hyperparathyroidism of renal origin: Secondary | ICD-10-CM | POA: Diagnosis not present

## 2021-01-03 DIAGNOSIS — Z992 Dependence on renal dialysis: Secondary | ICD-10-CM | POA: Diagnosis not present

## 2021-01-03 DIAGNOSIS — N186 End stage renal disease: Secondary | ICD-10-CM | POA: Diagnosis not present

## 2021-01-03 DIAGNOSIS — N2581 Secondary hyperparathyroidism of renal origin: Secondary | ICD-10-CM | POA: Diagnosis not present

## 2021-01-05 DIAGNOSIS — N2581 Secondary hyperparathyroidism of renal origin: Secondary | ICD-10-CM | POA: Diagnosis not present

## 2021-01-05 DIAGNOSIS — Z992 Dependence on renal dialysis: Secondary | ICD-10-CM | POA: Diagnosis not present

## 2021-01-05 DIAGNOSIS — N186 End stage renal disease: Secondary | ICD-10-CM | POA: Diagnosis not present

## 2021-01-06 DIAGNOSIS — T82858A Stenosis of vascular prosthetic devices, implants and grafts, initial encounter: Secondary | ICD-10-CM | POA: Diagnosis not present

## 2021-01-06 DIAGNOSIS — I871 Compression of vein: Secondary | ICD-10-CM | POA: Diagnosis not present

## 2021-01-10 DIAGNOSIS — Z992 Dependence on renal dialysis: Secondary | ICD-10-CM | POA: Diagnosis not present

## 2021-01-10 DIAGNOSIS — N186 End stage renal disease: Secondary | ICD-10-CM | POA: Diagnosis not present

## 2021-01-10 DIAGNOSIS — N2581 Secondary hyperparathyroidism of renal origin: Secondary | ICD-10-CM | POA: Diagnosis not present

## 2021-01-12 DIAGNOSIS — N186 End stage renal disease: Secondary | ICD-10-CM | POA: Diagnosis not present

## 2021-01-12 DIAGNOSIS — Z992 Dependence on renal dialysis: Secondary | ICD-10-CM | POA: Diagnosis not present

## 2021-01-12 DIAGNOSIS — N2581 Secondary hyperparathyroidism of renal origin: Secondary | ICD-10-CM | POA: Diagnosis not present

## 2021-01-13 DIAGNOSIS — Z992 Dependence on renal dialysis: Secondary | ICD-10-CM | POA: Diagnosis not present

## 2021-01-13 DIAGNOSIS — T8610 Unspecified complication of kidney transplant: Secondary | ICD-10-CM | POA: Diagnosis not present

## 2021-01-13 DIAGNOSIS — N186 End stage renal disease: Secondary | ICD-10-CM | POA: Diagnosis not present

## 2021-01-14 DIAGNOSIS — Z992 Dependence on renal dialysis: Secondary | ICD-10-CM | POA: Diagnosis not present

## 2021-01-14 DIAGNOSIS — N2581 Secondary hyperparathyroidism of renal origin: Secondary | ICD-10-CM | POA: Diagnosis not present

## 2021-01-14 DIAGNOSIS — N186 End stage renal disease: Secondary | ICD-10-CM | POA: Diagnosis not present

## 2021-01-17 DIAGNOSIS — Z992 Dependence on renal dialysis: Secondary | ICD-10-CM | POA: Diagnosis not present

## 2021-01-17 DIAGNOSIS — N2581 Secondary hyperparathyroidism of renal origin: Secondary | ICD-10-CM | POA: Diagnosis not present

## 2021-01-17 DIAGNOSIS — N186 End stage renal disease: Secondary | ICD-10-CM | POA: Diagnosis not present

## 2021-01-19 DIAGNOSIS — Z992 Dependence on renal dialysis: Secondary | ICD-10-CM | POA: Diagnosis not present

## 2021-01-19 DIAGNOSIS — N186 End stage renal disease: Secondary | ICD-10-CM | POA: Diagnosis not present

## 2021-01-19 DIAGNOSIS — N2581 Secondary hyperparathyroidism of renal origin: Secondary | ICD-10-CM | POA: Diagnosis not present

## 2021-01-21 DIAGNOSIS — Z992 Dependence on renal dialysis: Secondary | ICD-10-CM | POA: Diagnosis not present

## 2021-01-21 DIAGNOSIS — N2581 Secondary hyperparathyroidism of renal origin: Secondary | ICD-10-CM | POA: Diagnosis not present

## 2021-01-21 DIAGNOSIS — N186 End stage renal disease: Secondary | ICD-10-CM | POA: Diagnosis not present

## 2021-01-24 DIAGNOSIS — N2581 Secondary hyperparathyroidism of renal origin: Secondary | ICD-10-CM | POA: Diagnosis not present

## 2021-01-24 DIAGNOSIS — Z992 Dependence on renal dialysis: Secondary | ICD-10-CM | POA: Diagnosis not present

## 2021-01-24 DIAGNOSIS — N186 End stage renal disease: Secondary | ICD-10-CM | POA: Diagnosis not present

## 2021-01-28 DIAGNOSIS — N186 End stage renal disease: Secondary | ICD-10-CM | POA: Diagnosis not present

## 2021-01-28 DIAGNOSIS — Z992 Dependence on renal dialysis: Secondary | ICD-10-CM | POA: Diagnosis not present

## 2021-01-28 DIAGNOSIS — N2581 Secondary hyperparathyroidism of renal origin: Secondary | ICD-10-CM | POA: Diagnosis not present

## 2021-01-31 DIAGNOSIS — N2581 Secondary hyperparathyroidism of renal origin: Secondary | ICD-10-CM | POA: Diagnosis not present

## 2021-01-31 DIAGNOSIS — N186 End stage renal disease: Secondary | ICD-10-CM | POA: Diagnosis not present

## 2021-01-31 DIAGNOSIS — Z992 Dependence on renal dialysis: Secondary | ICD-10-CM | POA: Diagnosis not present

## 2021-02-02 DIAGNOSIS — N186 End stage renal disease: Secondary | ICD-10-CM | POA: Diagnosis not present

## 2021-02-02 DIAGNOSIS — N2581 Secondary hyperparathyroidism of renal origin: Secondary | ICD-10-CM | POA: Diagnosis not present

## 2021-02-02 DIAGNOSIS — Z992 Dependence on renal dialysis: Secondary | ICD-10-CM | POA: Diagnosis not present

## 2021-02-04 DIAGNOSIS — N2581 Secondary hyperparathyroidism of renal origin: Secondary | ICD-10-CM | POA: Diagnosis not present

## 2021-02-04 DIAGNOSIS — Z992 Dependence on renal dialysis: Secondary | ICD-10-CM | POA: Diagnosis not present

## 2021-02-04 DIAGNOSIS — N186 End stage renal disease: Secondary | ICD-10-CM | POA: Diagnosis not present

## 2021-02-07 DIAGNOSIS — N2581 Secondary hyperparathyroidism of renal origin: Secondary | ICD-10-CM | POA: Diagnosis not present

## 2021-02-07 DIAGNOSIS — N186 End stage renal disease: Secondary | ICD-10-CM | POA: Diagnosis not present

## 2021-02-07 DIAGNOSIS — Z992 Dependence on renal dialysis: Secondary | ICD-10-CM | POA: Diagnosis not present

## 2021-02-09 DIAGNOSIS — N186 End stage renal disease: Secondary | ICD-10-CM | POA: Diagnosis not present

## 2021-02-09 DIAGNOSIS — N2581 Secondary hyperparathyroidism of renal origin: Secondary | ICD-10-CM | POA: Diagnosis not present

## 2021-02-09 DIAGNOSIS — Z992 Dependence on renal dialysis: Secondary | ICD-10-CM | POA: Diagnosis not present

## 2021-02-11 DIAGNOSIS — N2581 Secondary hyperparathyroidism of renal origin: Secondary | ICD-10-CM | POA: Diagnosis not present

## 2021-02-11 DIAGNOSIS — N186 End stage renal disease: Secondary | ICD-10-CM | POA: Diagnosis not present

## 2021-02-11 DIAGNOSIS — Z992 Dependence on renal dialysis: Secondary | ICD-10-CM | POA: Diagnosis not present

## 2021-02-12 DIAGNOSIS — N186 End stage renal disease: Secondary | ICD-10-CM | POA: Diagnosis not present

## 2021-02-12 DIAGNOSIS — Z992 Dependence on renal dialysis: Secondary | ICD-10-CM | POA: Diagnosis not present

## 2021-02-12 DIAGNOSIS — T8610 Unspecified complication of kidney transplant: Secondary | ICD-10-CM | POA: Diagnosis not present

## 2021-02-14 DIAGNOSIS — Z992 Dependence on renal dialysis: Secondary | ICD-10-CM | POA: Diagnosis not present

## 2021-02-14 DIAGNOSIS — N186 End stage renal disease: Secondary | ICD-10-CM | POA: Diagnosis not present

## 2021-02-14 DIAGNOSIS — N2581 Secondary hyperparathyroidism of renal origin: Secondary | ICD-10-CM | POA: Diagnosis not present

## 2021-02-18 DIAGNOSIS — Z992 Dependence on renal dialysis: Secondary | ICD-10-CM | POA: Diagnosis not present

## 2021-02-18 DIAGNOSIS — N2581 Secondary hyperparathyroidism of renal origin: Secondary | ICD-10-CM | POA: Diagnosis not present

## 2021-02-18 DIAGNOSIS — N186 End stage renal disease: Secondary | ICD-10-CM | POA: Diagnosis not present

## 2021-02-21 DIAGNOSIS — Z992 Dependence on renal dialysis: Secondary | ICD-10-CM | POA: Diagnosis not present

## 2021-02-21 DIAGNOSIS — N2581 Secondary hyperparathyroidism of renal origin: Secondary | ICD-10-CM | POA: Diagnosis not present

## 2021-02-21 DIAGNOSIS — N186 End stage renal disease: Secondary | ICD-10-CM | POA: Diagnosis not present

## 2021-02-23 DIAGNOSIS — N2581 Secondary hyperparathyroidism of renal origin: Secondary | ICD-10-CM | POA: Diagnosis not present

## 2021-02-23 DIAGNOSIS — N186 End stage renal disease: Secondary | ICD-10-CM | POA: Diagnosis not present

## 2021-02-23 DIAGNOSIS — Z992 Dependence on renal dialysis: Secondary | ICD-10-CM | POA: Diagnosis not present

## 2021-02-25 DIAGNOSIS — Z992 Dependence on renal dialysis: Secondary | ICD-10-CM | POA: Diagnosis not present

## 2021-02-25 DIAGNOSIS — N2581 Secondary hyperparathyroidism of renal origin: Secondary | ICD-10-CM | POA: Diagnosis not present

## 2021-02-25 DIAGNOSIS — N186 End stage renal disease: Secondary | ICD-10-CM | POA: Diagnosis not present

## 2021-02-28 DIAGNOSIS — N2581 Secondary hyperparathyroidism of renal origin: Secondary | ICD-10-CM | POA: Diagnosis not present

## 2021-02-28 DIAGNOSIS — Z992 Dependence on renal dialysis: Secondary | ICD-10-CM | POA: Diagnosis not present

## 2021-02-28 DIAGNOSIS — N186 End stage renal disease: Secondary | ICD-10-CM | POA: Diagnosis not present

## 2021-03-04 DIAGNOSIS — N2581 Secondary hyperparathyroidism of renal origin: Secondary | ICD-10-CM | POA: Diagnosis not present

## 2021-03-04 DIAGNOSIS — N186 End stage renal disease: Secondary | ICD-10-CM | POA: Diagnosis not present

## 2021-03-04 DIAGNOSIS — Z992 Dependence on renal dialysis: Secondary | ICD-10-CM | POA: Diagnosis not present

## 2021-03-07 DIAGNOSIS — N186 End stage renal disease: Secondary | ICD-10-CM | POA: Diagnosis not present

## 2021-03-07 DIAGNOSIS — N2581 Secondary hyperparathyroidism of renal origin: Secondary | ICD-10-CM | POA: Diagnosis not present

## 2021-03-07 DIAGNOSIS — Z992 Dependence on renal dialysis: Secondary | ICD-10-CM | POA: Diagnosis not present

## 2021-03-09 DIAGNOSIS — N2581 Secondary hyperparathyroidism of renal origin: Secondary | ICD-10-CM | POA: Diagnosis not present

## 2021-03-09 DIAGNOSIS — Z992 Dependence on renal dialysis: Secondary | ICD-10-CM | POA: Diagnosis not present

## 2021-03-09 DIAGNOSIS — N186 End stage renal disease: Secondary | ICD-10-CM | POA: Diagnosis not present

## 2021-03-11 DIAGNOSIS — Z992 Dependence on renal dialysis: Secondary | ICD-10-CM | POA: Diagnosis not present

## 2021-03-11 DIAGNOSIS — N186 End stage renal disease: Secondary | ICD-10-CM | POA: Diagnosis not present

## 2021-03-11 DIAGNOSIS — N2581 Secondary hyperparathyroidism of renal origin: Secondary | ICD-10-CM | POA: Diagnosis not present

## 2021-03-15 DIAGNOSIS — T8610 Unspecified complication of kidney transplant: Secondary | ICD-10-CM | POA: Diagnosis not present

## 2021-03-15 DIAGNOSIS — N186 End stage renal disease: Secondary | ICD-10-CM | POA: Diagnosis not present

## 2021-03-15 DIAGNOSIS — Z992 Dependence on renal dialysis: Secondary | ICD-10-CM | POA: Diagnosis not present

## 2021-03-16 DIAGNOSIS — N2581 Secondary hyperparathyroidism of renal origin: Secondary | ICD-10-CM | POA: Diagnosis not present

## 2021-03-16 DIAGNOSIS — Z992 Dependence on renal dialysis: Secondary | ICD-10-CM | POA: Diagnosis not present

## 2021-03-16 DIAGNOSIS — N186 End stage renal disease: Secondary | ICD-10-CM | POA: Diagnosis not present

## 2021-03-18 DIAGNOSIS — Z992 Dependence on renal dialysis: Secondary | ICD-10-CM | POA: Diagnosis not present

## 2021-03-18 DIAGNOSIS — N2581 Secondary hyperparathyroidism of renal origin: Secondary | ICD-10-CM | POA: Diagnosis not present

## 2021-03-18 DIAGNOSIS — N186 End stage renal disease: Secondary | ICD-10-CM | POA: Diagnosis not present

## 2021-03-21 DIAGNOSIS — N2581 Secondary hyperparathyroidism of renal origin: Secondary | ICD-10-CM | POA: Diagnosis not present

## 2021-03-21 DIAGNOSIS — N186 End stage renal disease: Secondary | ICD-10-CM | POA: Diagnosis not present

## 2021-03-21 DIAGNOSIS — Z992 Dependence on renal dialysis: Secondary | ICD-10-CM | POA: Diagnosis not present

## 2021-03-24 DIAGNOSIS — N185 Chronic kidney disease, stage 5: Secondary | ICD-10-CM | POA: Diagnosis not present

## 2021-03-25 DIAGNOSIS — Z992 Dependence on renal dialysis: Secondary | ICD-10-CM | POA: Diagnosis not present

## 2021-03-25 DIAGNOSIS — N2581 Secondary hyperparathyroidism of renal origin: Secondary | ICD-10-CM | POA: Diagnosis not present

## 2021-03-25 DIAGNOSIS — N186 End stage renal disease: Secondary | ICD-10-CM | POA: Diagnosis not present

## 2021-03-25 DIAGNOSIS — T8610 Unspecified complication of kidney transplant: Secondary | ICD-10-CM | POA: Diagnosis not present

## 2021-03-28 DIAGNOSIS — N2581 Secondary hyperparathyroidism of renal origin: Secondary | ICD-10-CM | POA: Diagnosis not present

## 2021-03-28 DIAGNOSIS — N186 End stage renal disease: Secondary | ICD-10-CM | POA: Diagnosis not present

## 2021-03-28 DIAGNOSIS — Z992 Dependence on renal dialysis: Secondary | ICD-10-CM | POA: Diagnosis not present

## 2021-03-30 DIAGNOSIS — N186 End stage renal disease: Secondary | ICD-10-CM | POA: Diagnosis not present

## 2021-03-30 DIAGNOSIS — Z992 Dependence on renal dialysis: Secondary | ICD-10-CM | POA: Diagnosis not present

## 2021-03-30 DIAGNOSIS — N2581 Secondary hyperparathyroidism of renal origin: Secondary | ICD-10-CM | POA: Diagnosis not present

## 2021-04-04 DIAGNOSIS — N2581 Secondary hyperparathyroidism of renal origin: Secondary | ICD-10-CM | POA: Diagnosis not present

## 2021-04-04 DIAGNOSIS — Z992 Dependence on renal dialysis: Secondary | ICD-10-CM | POA: Diagnosis not present

## 2021-04-04 DIAGNOSIS — N186 End stage renal disease: Secondary | ICD-10-CM | POA: Diagnosis not present

## 2021-04-08 DIAGNOSIS — Z992 Dependence on renal dialysis: Secondary | ICD-10-CM | POA: Diagnosis not present

## 2021-04-08 DIAGNOSIS — N186 End stage renal disease: Secondary | ICD-10-CM | POA: Diagnosis not present

## 2021-04-08 DIAGNOSIS — N2581 Secondary hyperparathyroidism of renal origin: Secondary | ICD-10-CM | POA: Diagnosis not present

## 2021-04-11 DIAGNOSIS — N2581 Secondary hyperparathyroidism of renal origin: Secondary | ICD-10-CM | POA: Diagnosis not present

## 2021-04-11 DIAGNOSIS — N186 End stage renal disease: Secondary | ICD-10-CM | POA: Diagnosis not present

## 2021-04-11 DIAGNOSIS — Z992 Dependence on renal dialysis: Secondary | ICD-10-CM | POA: Diagnosis not present

## 2021-04-12 ENCOUNTER — Other Ambulatory Visit: Payer: Self-pay | Admitting: Cardiovascular Disease

## 2021-04-12 NOTE — Telephone Encounter (Signed)
Rx(s) sent to pharmacy electronically.  

## 2021-04-13 DIAGNOSIS — N186 End stage renal disease: Secondary | ICD-10-CM | POA: Diagnosis not present

## 2021-04-13 DIAGNOSIS — Z992 Dependence on renal dialysis: Secondary | ICD-10-CM | POA: Diagnosis not present

## 2021-04-13 DIAGNOSIS — N2581 Secondary hyperparathyroidism of renal origin: Secondary | ICD-10-CM | POA: Diagnosis not present

## 2021-04-15 DIAGNOSIS — Z992 Dependence on renal dialysis: Secondary | ICD-10-CM | POA: Diagnosis not present

## 2021-04-15 DIAGNOSIS — N186 End stage renal disease: Secondary | ICD-10-CM | POA: Diagnosis not present

## 2021-04-15 DIAGNOSIS — N2581 Secondary hyperparathyroidism of renal origin: Secondary | ICD-10-CM | POA: Diagnosis not present

## 2021-04-20 DIAGNOSIS — Z992 Dependence on renal dialysis: Secondary | ICD-10-CM | POA: Diagnosis not present

## 2021-04-20 DIAGNOSIS — N2581 Secondary hyperparathyroidism of renal origin: Secondary | ICD-10-CM | POA: Diagnosis not present

## 2021-04-20 DIAGNOSIS — N186 End stage renal disease: Secondary | ICD-10-CM | POA: Diagnosis not present

## 2021-04-22 DIAGNOSIS — N186 End stage renal disease: Secondary | ICD-10-CM | POA: Diagnosis not present

## 2021-04-22 DIAGNOSIS — Z992 Dependence on renal dialysis: Secondary | ICD-10-CM | POA: Diagnosis not present

## 2021-04-22 DIAGNOSIS — N2581 Secondary hyperparathyroidism of renal origin: Secondary | ICD-10-CM | POA: Diagnosis not present

## 2021-04-25 DIAGNOSIS — N2581 Secondary hyperparathyroidism of renal origin: Secondary | ICD-10-CM | POA: Diagnosis not present

## 2021-04-25 DIAGNOSIS — N186 End stage renal disease: Secondary | ICD-10-CM | POA: Diagnosis not present

## 2021-04-25 DIAGNOSIS — Z992 Dependence on renal dialysis: Secondary | ICD-10-CM | POA: Diagnosis not present

## 2021-04-27 DIAGNOSIS — N2581 Secondary hyperparathyroidism of renal origin: Secondary | ICD-10-CM | POA: Diagnosis not present

## 2021-04-27 DIAGNOSIS — Z992 Dependence on renal dialysis: Secondary | ICD-10-CM | POA: Diagnosis not present

## 2021-04-27 DIAGNOSIS — N186 End stage renal disease: Secondary | ICD-10-CM | POA: Diagnosis not present

## 2021-04-29 DIAGNOSIS — N186 End stage renal disease: Secondary | ICD-10-CM | POA: Diagnosis not present

## 2021-04-29 DIAGNOSIS — N2581 Secondary hyperparathyroidism of renal origin: Secondary | ICD-10-CM | POA: Diagnosis not present

## 2021-04-29 DIAGNOSIS — Z992 Dependence on renal dialysis: Secondary | ICD-10-CM | POA: Diagnosis not present

## 2021-05-02 DIAGNOSIS — Z992 Dependence on renal dialysis: Secondary | ICD-10-CM | POA: Diagnosis not present

## 2021-05-02 DIAGNOSIS — N2581 Secondary hyperparathyroidism of renal origin: Secondary | ICD-10-CM | POA: Diagnosis not present

## 2021-05-02 DIAGNOSIS — N186 End stage renal disease: Secondary | ICD-10-CM | POA: Diagnosis not present

## 2021-05-04 DIAGNOSIS — Z992 Dependence on renal dialysis: Secondary | ICD-10-CM | POA: Diagnosis not present

## 2021-05-04 DIAGNOSIS — N186 End stage renal disease: Secondary | ICD-10-CM | POA: Diagnosis not present

## 2021-05-04 DIAGNOSIS — N2581 Secondary hyperparathyroidism of renal origin: Secondary | ICD-10-CM | POA: Diagnosis not present

## 2021-05-06 DIAGNOSIS — N2581 Secondary hyperparathyroidism of renal origin: Secondary | ICD-10-CM | POA: Diagnosis not present

## 2021-05-06 DIAGNOSIS — N186 End stage renal disease: Secondary | ICD-10-CM | POA: Diagnosis not present

## 2021-05-06 DIAGNOSIS — Z992 Dependence on renal dialysis: Secondary | ICD-10-CM | POA: Diagnosis not present

## 2021-05-11 DIAGNOSIS — Z992 Dependence on renal dialysis: Secondary | ICD-10-CM | POA: Diagnosis not present

## 2021-05-11 DIAGNOSIS — N2581 Secondary hyperparathyroidism of renal origin: Secondary | ICD-10-CM | POA: Diagnosis not present

## 2021-05-11 DIAGNOSIS — N186 End stage renal disease: Secondary | ICD-10-CM | POA: Diagnosis not present

## 2021-05-13 DIAGNOSIS — N2581 Secondary hyperparathyroidism of renal origin: Secondary | ICD-10-CM | POA: Diagnosis not present

## 2021-05-13 DIAGNOSIS — N186 End stage renal disease: Secondary | ICD-10-CM | POA: Diagnosis not present

## 2021-05-13 DIAGNOSIS — Z992 Dependence on renal dialysis: Secondary | ICD-10-CM | POA: Diagnosis not present

## 2021-05-15 DIAGNOSIS — N186 End stage renal disease: Secondary | ICD-10-CM | POA: Diagnosis not present

## 2021-05-15 DIAGNOSIS — Z992 Dependence on renal dialysis: Secondary | ICD-10-CM | POA: Diagnosis not present

## 2021-05-15 DIAGNOSIS — T8610 Unspecified complication of kidney transplant: Secondary | ICD-10-CM | POA: Diagnosis not present

## 2021-05-16 DIAGNOSIS — N2581 Secondary hyperparathyroidism of renal origin: Secondary | ICD-10-CM | POA: Diagnosis not present

## 2021-05-16 DIAGNOSIS — N186 End stage renal disease: Secondary | ICD-10-CM | POA: Diagnosis not present

## 2021-05-16 DIAGNOSIS — Z992 Dependence on renal dialysis: Secondary | ICD-10-CM | POA: Diagnosis not present

## 2021-05-18 DIAGNOSIS — N2581 Secondary hyperparathyroidism of renal origin: Secondary | ICD-10-CM | POA: Diagnosis not present

## 2021-05-18 DIAGNOSIS — Z992 Dependence on renal dialysis: Secondary | ICD-10-CM | POA: Diagnosis not present

## 2021-05-18 DIAGNOSIS — N186 End stage renal disease: Secondary | ICD-10-CM | POA: Diagnosis not present

## 2021-05-20 DIAGNOSIS — N2581 Secondary hyperparathyroidism of renal origin: Secondary | ICD-10-CM | POA: Diagnosis not present

## 2021-05-20 DIAGNOSIS — N186 End stage renal disease: Secondary | ICD-10-CM | POA: Diagnosis not present

## 2021-05-20 DIAGNOSIS — Z992 Dependence on renal dialysis: Secondary | ICD-10-CM | POA: Diagnosis not present

## 2021-05-23 DIAGNOSIS — N186 End stage renal disease: Secondary | ICD-10-CM | POA: Diagnosis not present

## 2021-05-23 DIAGNOSIS — Z992 Dependence on renal dialysis: Secondary | ICD-10-CM | POA: Diagnosis not present

## 2021-05-23 DIAGNOSIS — N2581 Secondary hyperparathyroidism of renal origin: Secondary | ICD-10-CM | POA: Diagnosis not present

## 2021-05-25 DIAGNOSIS — N186 End stage renal disease: Secondary | ICD-10-CM | POA: Diagnosis not present

## 2021-05-25 DIAGNOSIS — N2581 Secondary hyperparathyroidism of renal origin: Secondary | ICD-10-CM | POA: Diagnosis not present

## 2021-05-25 DIAGNOSIS — Z992 Dependence on renal dialysis: Secondary | ICD-10-CM | POA: Diagnosis not present

## 2021-05-30 DIAGNOSIS — N186 End stage renal disease: Secondary | ICD-10-CM | POA: Diagnosis not present

## 2021-05-30 DIAGNOSIS — Z992 Dependence on renal dialysis: Secondary | ICD-10-CM | POA: Diagnosis not present

## 2021-05-30 DIAGNOSIS — N2581 Secondary hyperparathyroidism of renal origin: Secondary | ICD-10-CM | POA: Diagnosis not present

## 2021-06-01 DIAGNOSIS — Z992 Dependence on renal dialysis: Secondary | ICD-10-CM | POA: Diagnosis not present

## 2021-06-01 DIAGNOSIS — N186 End stage renal disease: Secondary | ICD-10-CM | POA: Diagnosis not present

## 2021-06-01 DIAGNOSIS — N2581 Secondary hyperparathyroidism of renal origin: Secondary | ICD-10-CM | POA: Diagnosis not present

## 2021-06-03 DIAGNOSIS — N186 End stage renal disease: Secondary | ICD-10-CM | POA: Diagnosis not present

## 2021-06-03 DIAGNOSIS — N2581 Secondary hyperparathyroidism of renal origin: Secondary | ICD-10-CM | POA: Diagnosis not present

## 2021-06-03 DIAGNOSIS — Z992 Dependence on renal dialysis: Secondary | ICD-10-CM | POA: Diagnosis not present

## 2021-06-06 DIAGNOSIS — N186 End stage renal disease: Secondary | ICD-10-CM | POA: Diagnosis not present

## 2021-06-06 DIAGNOSIS — N2581 Secondary hyperparathyroidism of renal origin: Secondary | ICD-10-CM | POA: Diagnosis not present

## 2021-06-06 DIAGNOSIS — Z992 Dependence on renal dialysis: Secondary | ICD-10-CM | POA: Diagnosis not present

## 2021-06-08 DIAGNOSIS — N186 End stage renal disease: Secondary | ICD-10-CM | POA: Diagnosis not present

## 2021-06-08 DIAGNOSIS — Z992 Dependence on renal dialysis: Secondary | ICD-10-CM | POA: Diagnosis not present

## 2021-06-08 DIAGNOSIS — N2581 Secondary hyperparathyroidism of renal origin: Secondary | ICD-10-CM | POA: Diagnosis not present

## 2021-06-10 DIAGNOSIS — N186 End stage renal disease: Secondary | ICD-10-CM | POA: Diagnosis not present

## 2021-06-10 DIAGNOSIS — Z992 Dependence on renal dialysis: Secondary | ICD-10-CM | POA: Diagnosis not present

## 2021-06-10 DIAGNOSIS — N2581 Secondary hyperparathyroidism of renal origin: Secondary | ICD-10-CM | POA: Diagnosis not present

## 2021-06-13 DIAGNOSIS — N2581 Secondary hyperparathyroidism of renal origin: Secondary | ICD-10-CM | POA: Diagnosis not present

## 2021-06-13 DIAGNOSIS — N186 End stage renal disease: Secondary | ICD-10-CM | POA: Diagnosis not present

## 2021-06-13 DIAGNOSIS — Z992 Dependence on renal dialysis: Secondary | ICD-10-CM | POA: Diagnosis not present

## 2021-06-15 DIAGNOSIS — Z992 Dependence on renal dialysis: Secondary | ICD-10-CM | POA: Diagnosis not present

## 2021-06-15 DIAGNOSIS — N2581 Secondary hyperparathyroidism of renal origin: Secondary | ICD-10-CM | POA: Diagnosis not present

## 2021-06-15 DIAGNOSIS — N186 End stage renal disease: Secondary | ICD-10-CM | POA: Diagnosis not present

## 2021-06-15 DIAGNOSIS — T8610 Unspecified complication of kidney transplant: Secondary | ICD-10-CM | POA: Diagnosis not present

## 2021-06-17 DIAGNOSIS — Z992 Dependence on renal dialysis: Secondary | ICD-10-CM | POA: Diagnosis not present

## 2021-06-17 DIAGNOSIS — N2581 Secondary hyperparathyroidism of renal origin: Secondary | ICD-10-CM | POA: Diagnosis not present

## 2021-06-17 DIAGNOSIS — N186 End stage renal disease: Secondary | ICD-10-CM | POA: Diagnosis not present

## 2021-06-22 DIAGNOSIS — N186 End stage renal disease: Secondary | ICD-10-CM | POA: Diagnosis not present

## 2021-06-22 DIAGNOSIS — Z992 Dependence on renal dialysis: Secondary | ICD-10-CM | POA: Diagnosis not present

## 2021-06-22 DIAGNOSIS — N2581 Secondary hyperparathyroidism of renal origin: Secondary | ICD-10-CM | POA: Diagnosis not present

## 2021-06-24 DIAGNOSIS — Z992 Dependence on renal dialysis: Secondary | ICD-10-CM | POA: Diagnosis not present

## 2021-06-24 DIAGNOSIS — N186 End stage renal disease: Secondary | ICD-10-CM | POA: Diagnosis not present

## 2021-06-24 DIAGNOSIS — N2581 Secondary hyperparathyroidism of renal origin: Secondary | ICD-10-CM | POA: Diagnosis not present

## 2021-06-27 DIAGNOSIS — N2581 Secondary hyperparathyroidism of renal origin: Secondary | ICD-10-CM | POA: Diagnosis not present

## 2021-06-27 DIAGNOSIS — Z992 Dependence on renal dialysis: Secondary | ICD-10-CM | POA: Diagnosis not present

## 2021-06-27 DIAGNOSIS — N186 End stage renal disease: Secondary | ICD-10-CM | POA: Diagnosis not present

## 2021-06-29 DIAGNOSIS — Z992 Dependence on renal dialysis: Secondary | ICD-10-CM | POA: Diagnosis not present

## 2021-06-29 DIAGNOSIS — N186 End stage renal disease: Secondary | ICD-10-CM | POA: Diagnosis not present

## 2021-06-29 DIAGNOSIS — N2581 Secondary hyperparathyroidism of renal origin: Secondary | ICD-10-CM | POA: Diagnosis not present

## 2021-07-01 DIAGNOSIS — N186 End stage renal disease: Secondary | ICD-10-CM | POA: Diagnosis not present

## 2021-07-01 DIAGNOSIS — N2581 Secondary hyperparathyroidism of renal origin: Secondary | ICD-10-CM | POA: Diagnosis not present

## 2021-07-01 DIAGNOSIS — Z992 Dependence on renal dialysis: Secondary | ICD-10-CM | POA: Diagnosis not present

## 2021-07-04 DIAGNOSIS — Z992 Dependence on renal dialysis: Secondary | ICD-10-CM | POA: Diagnosis not present

## 2021-07-04 DIAGNOSIS — N2581 Secondary hyperparathyroidism of renal origin: Secondary | ICD-10-CM | POA: Diagnosis not present

## 2021-07-04 DIAGNOSIS — N186 End stage renal disease: Secondary | ICD-10-CM | POA: Diagnosis not present

## 2021-07-06 DIAGNOSIS — Z992 Dependence on renal dialysis: Secondary | ICD-10-CM | POA: Diagnosis not present

## 2021-07-06 DIAGNOSIS — N2581 Secondary hyperparathyroidism of renal origin: Secondary | ICD-10-CM | POA: Diagnosis not present

## 2021-07-06 DIAGNOSIS — N186 End stage renal disease: Secondary | ICD-10-CM | POA: Diagnosis not present

## 2021-07-07 DIAGNOSIS — R001 Bradycardia, unspecified: Secondary | ICD-10-CM | POA: Diagnosis not present

## 2021-07-07 DIAGNOSIS — Z992 Dependence on renal dialysis: Secondary | ICD-10-CM | POA: Diagnosis not present

## 2021-07-07 DIAGNOSIS — I120 Hypertensive chronic kidney disease with stage 5 chronic kidney disease or end stage renal disease: Secondary | ICD-10-CM | POA: Diagnosis not present

## 2021-07-07 DIAGNOSIS — E1122 Type 2 diabetes mellitus with diabetic chronic kidney disease: Secondary | ICD-10-CM | POA: Diagnosis not present

## 2021-07-07 DIAGNOSIS — Z9289 Personal history of other medical treatment: Secondary | ICD-10-CM | POA: Diagnosis not present

## 2021-07-07 DIAGNOSIS — I10 Essential (primary) hypertension: Secondary | ICD-10-CM | POA: Diagnosis not present

## 2021-07-07 DIAGNOSIS — T8612 Kidney transplant failure: Secondary | ICD-10-CM | POA: Diagnosis not present

## 2021-07-07 DIAGNOSIS — Z9989 Dependence on other enabling machines and devices: Secondary | ICD-10-CM | POA: Diagnosis not present

## 2021-07-07 DIAGNOSIS — Z125 Encounter for screening for malignant neoplasm of prostate: Secondary | ICD-10-CM | POA: Diagnosis not present

## 2021-07-07 DIAGNOSIS — N186 End stage renal disease: Secondary | ICD-10-CM | POA: Diagnosis not present

## 2021-07-07 DIAGNOSIS — Z7682 Awaiting organ transplant status: Secondary | ICD-10-CM | POA: Diagnosis not present

## 2021-07-07 DIAGNOSIS — I129 Hypertensive chronic kidney disease with stage 1 through stage 4 chronic kidney disease, or unspecified chronic kidney disease: Secondary | ICD-10-CM | POA: Diagnosis not present

## 2021-07-07 DIAGNOSIS — R9431 Abnormal electrocardiogram [ECG] [EKG]: Secondary | ICD-10-CM | POA: Diagnosis not present

## 2021-07-07 DIAGNOSIS — Z01818 Encounter for other preprocedural examination: Secondary | ICD-10-CM | POA: Diagnosis not present

## 2021-07-07 DIAGNOSIS — G4733 Obstructive sleep apnea (adult) (pediatric): Secondary | ICD-10-CM | POA: Diagnosis not present

## 2021-07-07 DIAGNOSIS — Z940 Kidney transplant status: Secondary | ICD-10-CM | POA: Diagnosis not present

## 2021-07-07 DIAGNOSIS — N059 Unspecified nephritic syndrome with unspecified morphologic changes: Secondary | ICD-10-CM | POA: Diagnosis not present

## 2021-07-08 DIAGNOSIS — Z992 Dependence on renal dialysis: Secondary | ICD-10-CM | POA: Diagnosis not present

## 2021-07-08 DIAGNOSIS — N186 End stage renal disease: Secondary | ICD-10-CM | POA: Diagnosis not present

## 2021-07-08 DIAGNOSIS — N2581 Secondary hyperparathyroidism of renal origin: Secondary | ICD-10-CM | POA: Diagnosis not present

## 2021-07-11 DIAGNOSIS — N186 End stage renal disease: Secondary | ICD-10-CM | POA: Diagnosis not present

## 2021-07-11 DIAGNOSIS — N2581 Secondary hyperparathyroidism of renal origin: Secondary | ICD-10-CM | POA: Diagnosis not present

## 2021-07-11 DIAGNOSIS — Z992 Dependence on renal dialysis: Secondary | ICD-10-CM | POA: Diagnosis not present

## 2021-07-15 DIAGNOSIS — N186 End stage renal disease: Secondary | ICD-10-CM | POA: Diagnosis not present

## 2021-07-15 DIAGNOSIS — T8610 Unspecified complication of kidney transplant: Secondary | ICD-10-CM | POA: Diagnosis not present

## 2021-07-15 DIAGNOSIS — N2581 Secondary hyperparathyroidism of renal origin: Secondary | ICD-10-CM | POA: Diagnosis not present

## 2021-07-15 DIAGNOSIS — Z992 Dependence on renal dialysis: Secondary | ICD-10-CM | POA: Diagnosis not present

## 2021-07-18 DIAGNOSIS — N186 End stage renal disease: Secondary | ICD-10-CM | POA: Diagnosis not present

## 2021-07-18 DIAGNOSIS — N2581 Secondary hyperparathyroidism of renal origin: Secondary | ICD-10-CM | POA: Diagnosis not present

## 2021-07-18 DIAGNOSIS — Z992 Dependence on renal dialysis: Secondary | ICD-10-CM | POA: Diagnosis not present

## 2021-07-21 ENCOUNTER — Other Ambulatory Visit: Payer: Self-pay | Admitting: Nephrology

## 2021-07-21 DIAGNOSIS — C649 Malignant neoplasm of unspecified kidney, except renal pelvis: Secondary | ICD-10-CM

## 2021-07-23 DIAGNOSIS — N186 End stage renal disease: Secondary | ICD-10-CM | POA: Diagnosis not present

## 2021-07-23 DIAGNOSIS — Z992 Dependence on renal dialysis: Secondary | ICD-10-CM | POA: Diagnosis not present

## 2021-07-23 DIAGNOSIS — N2581 Secondary hyperparathyroidism of renal origin: Secondary | ICD-10-CM | POA: Diagnosis not present

## 2021-07-25 DIAGNOSIS — Z992 Dependence on renal dialysis: Secondary | ICD-10-CM | POA: Diagnosis not present

## 2021-07-25 DIAGNOSIS — N186 End stage renal disease: Secondary | ICD-10-CM | POA: Diagnosis not present

## 2021-07-25 DIAGNOSIS — N2581 Secondary hyperparathyroidism of renal origin: Secondary | ICD-10-CM | POA: Diagnosis not present

## 2021-07-27 DIAGNOSIS — N2581 Secondary hyperparathyroidism of renal origin: Secondary | ICD-10-CM | POA: Diagnosis not present

## 2021-07-27 DIAGNOSIS — N186 End stage renal disease: Secondary | ICD-10-CM | POA: Diagnosis not present

## 2021-07-27 DIAGNOSIS — Z992 Dependence on renal dialysis: Secondary | ICD-10-CM | POA: Diagnosis not present

## 2021-07-29 DIAGNOSIS — N186 End stage renal disease: Secondary | ICD-10-CM | POA: Diagnosis not present

## 2021-07-29 DIAGNOSIS — N2581 Secondary hyperparathyroidism of renal origin: Secondary | ICD-10-CM | POA: Diagnosis not present

## 2021-07-29 DIAGNOSIS — Z992 Dependence on renal dialysis: Secondary | ICD-10-CM | POA: Diagnosis not present

## 2021-08-01 DIAGNOSIS — N2581 Secondary hyperparathyroidism of renal origin: Secondary | ICD-10-CM | POA: Diagnosis not present

## 2021-08-01 DIAGNOSIS — Z992 Dependence on renal dialysis: Secondary | ICD-10-CM | POA: Diagnosis not present

## 2021-08-01 DIAGNOSIS — N186 End stage renal disease: Secondary | ICD-10-CM | POA: Diagnosis not present

## 2021-08-03 ENCOUNTER — Other Ambulatory Visit (HOSPITAL_COMMUNITY): Payer: Self-pay | Admitting: Nephrology

## 2021-08-03 DIAGNOSIS — Z01810 Encounter for preprocedural cardiovascular examination: Secondary | ICD-10-CM

## 2021-08-03 DIAGNOSIS — N186 End stage renal disease: Secondary | ICD-10-CM | POA: Diagnosis not present

## 2021-08-03 DIAGNOSIS — Z992 Dependence on renal dialysis: Secondary | ICD-10-CM | POA: Diagnosis not present

## 2021-08-03 DIAGNOSIS — N2581 Secondary hyperparathyroidism of renal origin: Secondary | ICD-10-CM | POA: Diagnosis not present

## 2021-08-05 DIAGNOSIS — N186 End stage renal disease: Secondary | ICD-10-CM | POA: Diagnosis not present

## 2021-08-05 DIAGNOSIS — Z992 Dependence on renal dialysis: Secondary | ICD-10-CM | POA: Diagnosis not present

## 2021-08-05 DIAGNOSIS — N2581 Secondary hyperparathyroidism of renal origin: Secondary | ICD-10-CM | POA: Diagnosis not present

## 2021-08-08 ENCOUNTER — Other Ambulatory Visit: Payer: Self-pay

## 2021-08-08 ENCOUNTER — Ambulatory Visit
Admission: RE | Admit: 2021-08-08 | Discharge: 2021-08-08 | Disposition: A | Discharge status: Home / Self Care | Payer: Medicare HMO | Source: Ambulatory Visit | Attending: Nephrology | Admitting: Nephrology

## 2021-08-08 DIAGNOSIS — N261 Atrophy of kidney (terminal): Secondary | ICD-10-CM | POA: Diagnosis not present

## 2021-08-08 DIAGNOSIS — N400 Enlarged prostate without lower urinary tract symptoms: Secondary | ICD-10-CM | POA: Diagnosis not present

## 2021-08-08 DIAGNOSIS — Z01818 Encounter for other preprocedural examination: Secondary | ICD-10-CM | POA: Diagnosis not present

## 2021-08-08 DIAGNOSIS — C649 Malignant neoplasm of unspecified kidney, except renal pelvis: Secondary | ICD-10-CM

## 2021-08-08 DIAGNOSIS — K4090 Unilateral inguinal hernia, without obstruction or gangrene, not specified as recurrent: Secondary | ICD-10-CM | POA: Diagnosis not present

## 2021-08-09 DIAGNOSIS — N186 End stage renal disease: Secondary | ICD-10-CM | POA: Diagnosis not present

## 2021-08-09 DIAGNOSIS — Z992 Dependence on renal dialysis: Secondary | ICD-10-CM | POA: Diagnosis not present

## 2021-08-09 DIAGNOSIS — N2581 Secondary hyperparathyroidism of renal origin: Secondary | ICD-10-CM | POA: Diagnosis not present

## 2021-08-10 DIAGNOSIS — Z992 Dependence on renal dialysis: Secondary | ICD-10-CM | POA: Diagnosis not present

## 2021-08-10 DIAGNOSIS — N186 End stage renal disease: Secondary | ICD-10-CM | POA: Diagnosis not present

## 2021-08-10 DIAGNOSIS — N2581 Secondary hyperparathyroidism of renal origin: Secondary | ICD-10-CM | POA: Diagnosis not present

## 2021-08-11 DIAGNOSIS — Z992 Dependence on renal dialysis: Secondary | ICD-10-CM | POA: Diagnosis not present

## 2021-08-11 DIAGNOSIS — N186 End stage renal disease: Secondary | ICD-10-CM | POA: Diagnosis not present

## 2021-08-11 DIAGNOSIS — N2581 Secondary hyperparathyroidism of renal origin: Secondary | ICD-10-CM | POA: Diagnosis not present

## 2021-08-15 DIAGNOSIS — Z992 Dependence on renal dialysis: Secondary | ICD-10-CM | POA: Diagnosis not present

## 2021-08-15 DIAGNOSIS — N186 End stage renal disease: Secondary | ICD-10-CM | POA: Diagnosis not present

## 2021-08-15 DIAGNOSIS — T8610 Unspecified complication of kidney transplant: Secondary | ICD-10-CM | POA: Diagnosis not present

## 2021-08-15 DIAGNOSIS — N2581 Secondary hyperparathyroidism of renal origin: Secondary | ICD-10-CM | POA: Diagnosis not present

## 2021-08-17 ENCOUNTER — Telehealth (HOSPITAL_COMMUNITY): Payer: Self-pay | Admitting: *Deleted

## 2021-08-17 DIAGNOSIS — Z992 Dependence on renal dialysis: Secondary | ICD-10-CM | POA: Diagnosis not present

## 2021-08-17 DIAGNOSIS — N2581 Secondary hyperparathyroidism of renal origin: Secondary | ICD-10-CM | POA: Diagnosis not present

## 2021-08-17 DIAGNOSIS — N186 End stage renal disease: Secondary | ICD-10-CM | POA: Diagnosis not present

## 2021-08-17 NOTE — Telephone Encounter (Signed)
Left message on voicemail in reference to upcoming appointment scheduled for 08/22/21. Phone number given for a call back so details instructions can be given. Jonathon Conley

## 2021-08-19 DIAGNOSIS — Z992 Dependence on renal dialysis: Secondary | ICD-10-CM | POA: Diagnosis not present

## 2021-08-19 DIAGNOSIS — N2581 Secondary hyperparathyroidism of renal origin: Secondary | ICD-10-CM | POA: Diagnosis not present

## 2021-08-19 DIAGNOSIS — N186 End stage renal disease: Secondary | ICD-10-CM | POA: Diagnosis not present

## 2021-08-22 ENCOUNTER — Ambulatory Visit (HOSPITAL_COMMUNITY): Payer: Medicare HMO | Attending: Cardiology

## 2021-08-22 ENCOUNTER — Other Ambulatory Visit: Payer: Self-pay

## 2021-08-22 ENCOUNTER — Ambulatory Visit (HOSPITAL_COMMUNITY): Payer: Medicare HMO

## 2021-08-22 ENCOUNTER — Ambulatory Visit (HOSPITAL_BASED_OUTPATIENT_CLINIC_OR_DEPARTMENT_OTHER): Payer: Medicare HMO

## 2021-08-22 DIAGNOSIS — Z01810 Encounter for preprocedural cardiovascular examination: Secondary | ICD-10-CM

## 2021-08-22 LAB — ECHOCARDIOGRAM COMPLETE
AR max vel: 2.88 cm2
AV Area VTI: 2.79 cm2
AV Area mean vel: 2.8 cm2
AV Mean grad: 6 mmHg
AV Peak grad: 10.8 mmHg
Ao pk vel: 1.64 m/s
Area-P 1/2: 2.83 cm2
S' Lateral: 2.4 cm

## 2021-08-22 MED ORDER — PERFLUTREN LIPID MICROSPHERE
1.0000 mL | INTRAVENOUS | Status: AC | PRN
Start: 2021-08-22 — End: 2021-08-22
  Administered 2021-08-22 (×3): 2 mL via INTRAVENOUS

## 2021-08-24 DIAGNOSIS — N186 End stage renal disease: Secondary | ICD-10-CM | POA: Diagnosis not present

## 2021-08-24 DIAGNOSIS — Z992 Dependence on renal dialysis: Secondary | ICD-10-CM | POA: Diagnosis not present

## 2021-08-24 DIAGNOSIS — N2581 Secondary hyperparathyroidism of renal origin: Secondary | ICD-10-CM | POA: Diagnosis not present

## 2021-08-26 DIAGNOSIS — Z992 Dependence on renal dialysis: Secondary | ICD-10-CM | POA: Diagnosis not present

## 2021-08-26 DIAGNOSIS — Z23 Encounter for immunization: Secondary | ICD-10-CM | POA: Diagnosis not present

## 2021-08-26 DIAGNOSIS — N2581 Secondary hyperparathyroidism of renal origin: Secondary | ICD-10-CM | POA: Diagnosis not present

## 2021-08-26 DIAGNOSIS — N186 End stage renal disease: Secondary | ICD-10-CM | POA: Diagnosis not present

## 2021-08-29 DIAGNOSIS — N2581 Secondary hyperparathyroidism of renal origin: Secondary | ICD-10-CM | POA: Diagnosis not present

## 2021-08-29 DIAGNOSIS — Z992 Dependence on renal dialysis: Secondary | ICD-10-CM | POA: Diagnosis not present

## 2021-08-29 DIAGNOSIS — N186 End stage renal disease: Secondary | ICD-10-CM | POA: Diagnosis not present

## 2021-08-31 DIAGNOSIS — N186 End stage renal disease: Secondary | ICD-10-CM | POA: Diagnosis not present

## 2021-08-31 DIAGNOSIS — N2581 Secondary hyperparathyroidism of renal origin: Secondary | ICD-10-CM | POA: Diagnosis not present

## 2021-08-31 DIAGNOSIS — Z992 Dependence on renal dialysis: Secondary | ICD-10-CM | POA: Diagnosis not present

## 2021-09-02 DIAGNOSIS — N2581 Secondary hyperparathyroidism of renal origin: Secondary | ICD-10-CM | POA: Diagnosis not present

## 2021-09-02 DIAGNOSIS — Z992 Dependence on renal dialysis: Secondary | ICD-10-CM | POA: Diagnosis not present

## 2021-09-02 DIAGNOSIS — N186 End stage renal disease: Secondary | ICD-10-CM | POA: Diagnosis not present

## 2021-09-05 DIAGNOSIS — Z992 Dependence on renal dialysis: Secondary | ICD-10-CM | POA: Diagnosis not present

## 2021-09-05 DIAGNOSIS — N2581 Secondary hyperparathyroidism of renal origin: Secondary | ICD-10-CM | POA: Diagnosis not present

## 2021-09-05 DIAGNOSIS — N186 End stage renal disease: Secondary | ICD-10-CM | POA: Diagnosis not present

## 2021-09-07 DIAGNOSIS — N2581 Secondary hyperparathyroidism of renal origin: Secondary | ICD-10-CM | POA: Diagnosis not present

## 2021-09-07 DIAGNOSIS — N186 End stage renal disease: Secondary | ICD-10-CM | POA: Diagnosis not present

## 2021-09-07 DIAGNOSIS — Z992 Dependence on renal dialysis: Secondary | ICD-10-CM | POA: Diagnosis not present

## 2021-09-10 DIAGNOSIS — N186 End stage renal disease: Secondary | ICD-10-CM | POA: Diagnosis not present

## 2021-09-10 DIAGNOSIS — Z992 Dependence on renal dialysis: Secondary | ICD-10-CM | POA: Diagnosis not present

## 2021-09-10 DIAGNOSIS — N2581 Secondary hyperparathyroidism of renal origin: Secondary | ICD-10-CM | POA: Diagnosis not present

## 2021-09-12 DIAGNOSIS — Z992 Dependence on renal dialysis: Secondary | ICD-10-CM | POA: Diagnosis not present

## 2021-09-12 DIAGNOSIS — N2581 Secondary hyperparathyroidism of renal origin: Secondary | ICD-10-CM | POA: Diagnosis not present

## 2021-09-12 DIAGNOSIS — N186 End stage renal disease: Secondary | ICD-10-CM | POA: Diagnosis not present

## 2021-09-14 DIAGNOSIS — Z992 Dependence on renal dialysis: Secondary | ICD-10-CM | POA: Diagnosis not present

## 2021-09-14 DIAGNOSIS — T8610 Unspecified complication of kidney transplant: Secondary | ICD-10-CM | POA: Diagnosis not present

## 2021-09-14 DIAGNOSIS — N2581 Secondary hyperparathyroidism of renal origin: Secondary | ICD-10-CM | POA: Diagnosis not present

## 2021-09-14 DIAGNOSIS — N186 End stage renal disease: Secondary | ICD-10-CM | POA: Diagnosis not present

## 2021-09-16 DIAGNOSIS — Z992 Dependence on renal dialysis: Secondary | ICD-10-CM | POA: Diagnosis not present

## 2021-09-16 DIAGNOSIS — N2581 Secondary hyperparathyroidism of renal origin: Secondary | ICD-10-CM | POA: Diagnosis not present

## 2021-09-16 DIAGNOSIS — N186 End stage renal disease: Secondary | ICD-10-CM | POA: Diagnosis not present

## 2021-09-19 DIAGNOSIS — Z992 Dependence on renal dialysis: Secondary | ICD-10-CM | POA: Diagnosis not present

## 2021-09-19 DIAGNOSIS — N186 End stage renal disease: Secondary | ICD-10-CM | POA: Diagnosis not present

## 2021-09-19 DIAGNOSIS — N2581 Secondary hyperparathyroidism of renal origin: Secondary | ICD-10-CM | POA: Diagnosis not present

## 2021-09-21 DIAGNOSIS — N186 End stage renal disease: Secondary | ICD-10-CM | POA: Diagnosis not present

## 2021-09-21 DIAGNOSIS — Z992 Dependence on renal dialysis: Secondary | ICD-10-CM | POA: Diagnosis not present

## 2021-09-21 DIAGNOSIS — N2581 Secondary hyperparathyroidism of renal origin: Secondary | ICD-10-CM | POA: Diagnosis not present

## 2021-09-26 DIAGNOSIS — Z992 Dependence on renal dialysis: Secondary | ICD-10-CM | POA: Diagnosis not present

## 2021-09-26 DIAGNOSIS — N2581 Secondary hyperparathyroidism of renal origin: Secondary | ICD-10-CM | POA: Diagnosis not present

## 2021-09-26 DIAGNOSIS — N186 End stage renal disease: Secondary | ICD-10-CM | POA: Diagnosis not present

## 2021-09-28 DIAGNOSIS — N2581 Secondary hyperparathyroidism of renal origin: Secondary | ICD-10-CM | POA: Diagnosis not present

## 2021-09-28 DIAGNOSIS — N186 End stage renal disease: Secondary | ICD-10-CM | POA: Diagnosis not present

## 2021-09-28 DIAGNOSIS — Z992 Dependence on renal dialysis: Secondary | ICD-10-CM | POA: Diagnosis not present

## 2021-09-30 DIAGNOSIS — N2581 Secondary hyperparathyroidism of renal origin: Secondary | ICD-10-CM | POA: Diagnosis not present

## 2021-09-30 DIAGNOSIS — Z992 Dependence on renal dialysis: Secondary | ICD-10-CM | POA: Diagnosis not present

## 2021-09-30 DIAGNOSIS — N186 End stage renal disease: Secondary | ICD-10-CM | POA: Diagnosis not present

## 2021-10-03 DIAGNOSIS — N2581 Secondary hyperparathyroidism of renal origin: Secondary | ICD-10-CM | POA: Diagnosis not present

## 2021-10-03 DIAGNOSIS — Z992 Dependence on renal dialysis: Secondary | ICD-10-CM | POA: Diagnosis not present

## 2021-10-03 DIAGNOSIS — N186 End stage renal disease: Secondary | ICD-10-CM | POA: Diagnosis not present

## 2021-10-04 DIAGNOSIS — Z940 Kidney transplant status: Secondary | ICD-10-CM | POA: Diagnosis not present

## 2021-10-04 DIAGNOSIS — Z01810 Encounter for preprocedural cardiovascular examination: Secondary | ICD-10-CM | POA: Diagnosis not present

## 2021-10-04 DIAGNOSIS — N185 Chronic kidney disease, stage 5: Secondary | ICD-10-CM | POA: Diagnosis not present

## 2021-10-04 DIAGNOSIS — I82421 Acute embolism and thrombosis of right iliac vein: Secondary | ICD-10-CM | POA: Diagnosis not present

## 2021-10-05 DIAGNOSIS — Z992 Dependence on renal dialysis: Secondary | ICD-10-CM | POA: Diagnosis not present

## 2021-10-05 DIAGNOSIS — N2581 Secondary hyperparathyroidism of renal origin: Secondary | ICD-10-CM | POA: Diagnosis not present

## 2021-10-05 DIAGNOSIS — N186 End stage renal disease: Secondary | ICD-10-CM | POA: Diagnosis not present

## 2021-10-07 DIAGNOSIS — Z992 Dependence on renal dialysis: Secondary | ICD-10-CM | POA: Diagnosis not present

## 2021-10-07 DIAGNOSIS — N2581 Secondary hyperparathyroidism of renal origin: Secondary | ICD-10-CM | POA: Diagnosis not present

## 2021-10-07 DIAGNOSIS — N186 End stage renal disease: Secondary | ICD-10-CM | POA: Diagnosis not present

## 2021-10-10 DIAGNOSIS — N186 End stage renal disease: Secondary | ICD-10-CM | POA: Diagnosis not present

## 2021-10-10 DIAGNOSIS — N2581 Secondary hyperparathyroidism of renal origin: Secondary | ICD-10-CM | POA: Diagnosis not present

## 2021-10-10 DIAGNOSIS — Z992 Dependence on renal dialysis: Secondary | ICD-10-CM | POA: Diagnosis not present

## 2021-10-12 DIAGNOSIS — N186 End stage renal disease: Secondary | ICD-10-CM | POA: Diagnosis not present

## 2021-10-12 DIAGNOSIS — N2581 Secondary hyperparathyroidism of renal origin: Secondary | ICD-10-CM | POA: Diagnosis not present

## 2021-10-12 DIAGNOSIS — Z992 Dependence on renal dialysis: Secondary | ICD-10-CM | POA: Diagnosis not present

## 2021-10-14 DIAGNOSIS — N186 End stage renal disease: Secondary | ICD-10-CM | POA: Diagnosis not present

## 2021-10-14 DIAGNOSIS — Z992 Dependence on renal dialysis: Secondary | ICD-10-CM | POA: Diagnosis not present

## 2021-10-14 DIAGNOSIS — N2581 Secondary hyperparathyroidism of renal origin: Secondary | ICD-10-CM | POA: Diagnosis not present

## 2021-10-15 DIAGNOSIS — T8610 Unspecified complication of kidney transplant: Secondary | ICD-10-CM | POA: Diagnosis not present

## 2021-10-15 DIAGNOSIS — Z992 Dependence on renal dialysis: Secondary | ICD-10-CM | POA: Diagnosis not present

## 2021-10-15 DIAGNOSIS — N186 End stage renal disease: Secondary | ICD-10-CM | POA: Diagnosis not present

## 2021-10-17 DIAGNOSIS — N2581 Secondary hyperparathyroidism of renal origin: Secondary | ICD-10-CM | POA: Diagnosis not present

## 2021-10-17 DIAGNOSIS — Z992 Dependence on renal dialysis: Secondary | ICD-10-CM | POA: Diagnosis not present

## 2021-10-17 DIAGNOSIS — N186 End stage renal disease: Secondary | ICD-10-CM | POA: Diagnosis not present

## 2021-10-18 DIAGNOSIS — T82858A Stenosis of vascular prosthetic devices, implants and grafts, initial encounter: Secondary | ICD-10-CM | POA: Diagnosis not present

## 2021-10-18 DIAGNOSIS — Z992 Dependence on renal dialysis: Secondary | ICD-10-CM | POA: Diagnosis not present

## 2021-10-18 DIAGNOSIS — I871 Compression of vein: Secondary | ICD-10-CM | POA: Diagnosis not present

## 2021-10-18 DIAGNOSIS — N186 End stage renal disease: Secondary | ICD-10-CM | POA: Diagnosis not present

## 2021-10-19 DIAGNOSIS — N186 End stage renal disease: Secondary | ICD-10-CM | POA: Diagnosis not present

## 2021-10-19 DIAGNOSIS — Z992 Dependence on renal dialysis: Secondary | ICD-10-CM | POA: Diagnosis not present

## 2021-10-19 DIAGNOSIS — N2581 Secondary hyperparathyroidism of renal origin: Secondary | ICD-10-CM | POA: Diagnosis not present

## 2021-10-21 DIAGNOSIS — Z992 Dependence on renal dialysis: Secondary | ICD-10-CM | POA: Diagnosis not present

## 2021-10-21 DIAGNOSIS — N2581 Secondary hyperparathyroidism of renal origin: Secondary | ICD-10-CM | POA: Diagnosis not present

## 2021-10-21 DIAGNOSIS — N186 End stage renal disease: Secondary | ICD-10-CM | POA: Diagnosis not present

## 2021-10-24 DIAGNOSIS — Z992 Dependence on renal dialysis: Secondary | ICD-10-CM | POA: Diagnosis not present

## 2021-10-24 DIAGNOSIS — N2581 Secondary hyperparathyroidism of renal origin: Secondary | ICD-10-CM | POA: Diagnosis not present

## 2021-10-24 DIAGNOSIS — N186 End stage renal disease: Secondary | ICD-10-CM | POA: Diagnosis not present

## 2021-10-26 DIAGNOSIS — Z992 Dependence on renal dialysis: Secondary | ICD-10-CM | POA: Diagnosis not present

## 2021-10-26 DIAGNOSIS — N2581 Secondary hyperparathyroidism of renal origin: Secondary | ICD-10-CM | POA: Diagnosis not present

## 2021-10-26 DIAGNOSIS — N186 End stage renal disease: Secondary | ICD-10-CM | POA: Diagnosis not present

## 2021-10-28 DIAGNOSIS — N186 End stage renal disease: Secondary | ICD-10-CM | POA: Diagnosis not present

## 2021-10-28 DIAGNOSIS — N2581 Secondary hyperparathyroidism of renal origin: Secondary | ICD-10-CM | POA: Diagnosis not present

## 2021-10-28 DIAGNOSIS — Z992 Dependence on renal dialysis: Secondary | ICD-10-CM | POA: Diagnosis not present

## 2021-10-31 DIAGNOSIS — Z992 Dependence on renal dialysis: Secondary | ICD-10-CM | POA: Diagnosis not present

## 2021-10-31 DIAGNOSIS — N186 End stage renal disease: Secondary | ICD-10-CM | POA: Diagnosis not present

## 2021-10-31 DIAGNOSIS — N2581 Secondary hyperparathyroidism of renal origin: Secondary | ICD-10-CM | POA: Diagnosis not present

## 2021-11-02 DIAGNOSIS — Z992 Dependence on renal dialysis: Secondary | ICD-10-CM | POA: Diagnosis not present

## 2021-11-02 DIAGNOSIS — N186 End stage renal disease: Secondary | ICD-10-CM | POA: Diagnosis not present

## 2021-11-02 DIAGNOSIS — N2581 Secondary hyperparathyroidism of renal origin: Secondary | ICD-10-CM | POA: Diagnosis not present

## 2021-11-04 ENCOUNTER — Telehealth: Payer: Medicare HMO | Admitting: Emergency Medicine

## 2021-11-04 DIAGNOSIS — U071 COVID-19: Secondary | ICD-10-CM | POA: Diagnosis not present

## 2021-11-04 DIAGNOSIS — Z992 Dependence on renal dialysis: Secondary | ICD-10-CM | POA: Diagnosis not present

## 2021-11-04 DIAGNOSIS — N186 End stage renal disease: Secondary | ICD-10-CM | POA: Diagnosis not present

## 2021-11-04 DIAGNOSIS — N2581 Secondary hyperparathyroidism of renal origin: Secondary | ICD-10-CM | POA: Diagnosis not present

## 2021-11-04 MED ORDER — MOLNUPIRAVIR EUA 200MG CAPSULE: 4.0000 | ORAL_CAPSULE | Freq: Two times a day (BID) | ORAL | 0 refills | Status: AC

## 2021-11-04 NOTE — Progress Notes (Signed)
Virtual Visit Consent   [name], you are scheduled for a virtual visit with a [facility] provider today.     Just as with appointments in the office, your consent must be obtained to participate.  Your consent will be active for this visit and any virtual visit you may have with one of our providers in the next 365 days.     If you have a MyChart account, a copy of this consent can be sent to you electronically.  All virtual visits are billed to your insurance company just like a traditional visit in the office.    As this is a virtual visit, video technology does not allow for your provider to perform a traditional examination.  This may limit your provider's ability to fully assess your condition.  If your provider identifies any concerns that need to be evaluated in person or the need to arrange testing (such as labs, EKG, etc.), we will make arrangements to do so.     Although advances in technology are sophisticated, we cannot ensure that it will always work on either your end or our end.  If the connection with a video visit is poor, the visit may have to be switched to a telephone visit.  With either a video or telephone visit, we are not always able to ensure that we have a secure connection.     I need to obtain your verbal consent now.   Are you willing to proceed with your visit today?    [name] has provided verbal consent on 11/04/2021 for a virtual visit video.   Montine Circle, PA-C   Date: 11/04/2021 12:43 PM   Virtual Visit via Video Note   I, Montine Circle, connected with  Jonathon Conley  ([id number], 09-04-1966) on 11/04/21 at 12:45 PM EST by a video-enabled telemedicine application and verified that I am speaking with the correct person using two identifiers.  Location: Patient: Virtual Visit Location Patient: Home Provider: Virtual Visit Location Provider: Home Office   I discussed the limitations of evaluation and management by telemedicine and the  availability of in person appointments. The patient expressed understanding and agreed to proceed.    History of Present Illness: Jonathon Conley is a 56 y.o. who identifies as a male who was assigned male at birth, and is being seen today for COVID 66.  States that he started with a sore throat.  Hasn't developed a fever.  Wife is sick with similar.  States that his symptoms haven't gotten too bad.  Reports he is a dialysis patient.  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[place]) 06/15/2017   SIRS (systemic inflammatory response syndrome) ([place]) 12/19/2016   End stage renal disease ([place]) 12/19/2016   Hyponatremia 12/19/2016   Anemia of chronic disease 12/19/2016   Postprocedural seroma of a musculoskeletal structure following other procedure 12/17/2016   Secondary hyperparathyroidism of renal origin ([place]) 10/15/2016   Abnormal results of liver function studies 04/27/2016   Hematochezia 04/08/2016  HTN (hypertension) 04/08/2016   Intravenous line infection ([place]) 04/08/2016   Insomnia 01/04/2016   Malignant hypertension 12/14/2015   Pain, unspecified 12/14/2015   Symptomatic anemia 11/03/2015   Chronic anemia 11/03/2015   Thrombocytopenia ([place]) 11/03/2015   Pleural effusion on right 11/03/2015   Shortness of breath 11/02/2015   Kidney transplant failure 10/17/2015   Sepsis ([place]) 09/23/2015   ESRD on dialysis ([place]) 09/22/2015   Accelerated hypertension 09/22/2015   Leg edema, left    HCAP  (healthcare-associated pneumonia) 09/21/2015   Hypertensive chronic kidney disease with stage 5 chronic kidney disease or end stage renal disease ([place]) 09/15/2015   Moderate protein-calorie malnutrition ([place]) 09/10/2015   Encounter for immunization 09/06/2015   Bacterial infection, unspecified 08/24/2015   Iron deficiency anemia, unspecified 08/24/2015   Bacteremia due to Gram-positive bacteria 08/20/2015   Acute blood loss anemia 08/12/2015   C. difficile colitis 08/12/2015   Bleeding gastrointestinal 08/12/2015   Acute-on-chronic renal failure ([place]) 08/10/2015   H/O kidney transplant 08/08/2015   Non compliance with medical treatment 08/08/2015   History of anemia 08/08/2015   AKI (acute kidney injury) ([place]) 02/14/2015   Renal transplant recipient 02/14/2015   Acute on chronic diastolic congestive heart failure ([place]) 02/14/2015   Anemia 02/14/2015   Hyperlipidemia    OSA (obstructive sleep apnea) 03/27/2013    Allergies:  Allergies  Allergen Reactions   Lisinopril Swelling    Facial swelling   Medications:  Current Outpatient Medications:    amLODipine (NORVASC) 5 MG tablet, Take 1 tablet (5 mg total) by mouth in the morning and at bedtime., Disp: 180 tablet, Rfl: 3   aspirin EC 81 MG tablet, Take 81 mg by mouth daily. , Disp: , Rfl:    calcitRIOL (ROCALTROL) 0.5 MCG capsule, Take 1 capsule (0.5 mcg total) by mouth Every Tuesday,Thursday,and Saturday with dialysis., Disp: 15 capsule, Rfl: 0   carvedilol (COREG) 25 MG tablet, Take 25 mg by mouth 2 (two) times daily. , Disp: , Rfl: 0   cloNIDine (CATAPRES) 0.2 MG tablet, Take 0.2 mg by mouth 2 (two) times daily., Disp: , Rfl:    hydrALAZINE (APRESOLINE) 100 MG tablet, Take 1 tablet (100 mg total) by mouth in the morning and at bedtime. Need appointment, Disp: 180 tablet, Rfl: 0   lactulose (CHRONULAC) 10 GM/15ML solution, Take by mouth., Disp: , Rfl:    sevelamer carbonate (RENVELA) 800 MG tablet, Take 800 mg by mouth 3 (three)  times daily with meals., Disp: , Rfl:    simvastatin (ZOCOR) 5 MG tablet, Take 5 mg by mouth Nightly., Disp: , Rfl:    valsartan (DIOVAN) 320 MG tablet, Take 1 tablet (320 mg total) by mouth daily., Disp: 90 tablet, Rfl: 3  Observations/Objective: Patient is well-developed, well-nourished in no acute distress.  Resting comfortably  at home.  Head is normocephalic, atraumatic.  No labored breathing.  Speech is clear and coherent with logical content.  Patient is alert and oriented at baseline.    Assessment and Plan: 1. COVID-19 - Molnupiravir.  I called and spoke with [place] about this treatment.   - ED follow-up for new or worsening symptoms.   Follow Up Instructions: I discussed the assessment and treatment plan with the patient. The patient was provided an opportunity to ask questions and all were answered. The patient agreed with the plan and demonstrated an understanding of the instructions.  A copy of instructions were sent to the patient via MyChart unless otherwise noted below.  The patient was advised to call back or seek an in-person evaluation if the symptoms worsen or if the condition fails to improve as anticipated.  Time:  I spent 13 minutes with the patient via telehealth technology discussing the above problems/concerns.    Montine Circle, PA-C

## 2021-11-07 DIAGNOSIS — N186 End stage renal disease: Secondary | ICD-10-CM | POA: Diagnosis not present

## 2021-11-07 DIAGNOSIS — Z992 Dependence on renal dialysis: Secondary | ICD-10-CM | POA: Diagnosis not present

## 2021-11-07 DIAGNOSIS — N2581 Secondary hyperparathyroidism of renal origin: Secondary | ICD-10-CM | POA: Diagnosis not present

## 2021-11-11 DIAGNOSIS — N186 End stage renal disease: Secondary | ICD-10-CM | POA: Diagnosis not present

## 2021-11-11 DIAGNOSIS — Z992 Dependence on renal dialysis: Secondary | ICD-10-CM | POA: Diagnosis not present

## 2021-11-11 DIAGNOSIS — N2581 Secondary hyperparathyroidism of renal origin: Secondary | ICD-10-CM | POA: Diagnosis not present

## 2021-11-14 DIAGNOSIS — N186 End stage renal disease: Secondary | ICD-10-CM | POA: Diagnosis not present

## 2021-11-14 DIAGNOSIS — Z992 Dependence on renal dialysis: Secondary | ICD-10-CM | POA: Diagnosis not present

## 2021-11-14 DIAGNOSIS — N2581 Secondary hyperparathyroidism of renal origin: Secondary | ICD-10-CM | POA: Diagnosis not present

## 2021-11-15 DIAGNOSIS — I120 Hypertensive chronic kidney disease with stage 5 chronic kidney disease or end stage renal disease: Secondary | ICD-10-CM | POA: Diagnosis not present

## 2021-11-15 DIAGNOSIS — T8610 Unspecified complication of kidney transplant: Secondary | ICD-10-CM | POA: Diagnosis not present

## 2021-11-15 DIAGNOSIS — A4189 Other specified sepsis: Secondary | ICD-10-CM | POA: Diagnosis not present

## 2021-11-15 DIAGNOSIS — Z01818 Encounter for other preprocedural examination: Secondary | ICD-10-CM | POA: Diagnosis not present

## 2021-11-15 DIAGNOSIS — D638 Anemia in other chronic diseases classified elsewhere: Secondary | ICD-10-CM | POA: Diagnosis not present

## 2021-11-15 DIAGNOSIS — D62 Acute posthemorrhagic anemia: Secondary | ICD-10-CM | POA: Diagnosis not present

## 2021-11-15 DIAGNOSIS — A0472 Enterocolitis due to Clostridium difficile, not specified as recurrent: Secondary | ICD-10-CM | POA: Diagnosis not present

## 2021-11-15 DIAGNOSIS — N186 End stage renal disease: Secondary | ICD-10-CM | POA: Diagnosis not present

## 2021-11-15 DIAGNOSIS — T8612 Kidney transplant failure: Secondary | ICD-10-CM | POA: Diagnosis not present

## 2021-11-15 DIAGNOSIS — Z992 Dependence on renal dialysis: Secondary | ICD-10-CM | POA: Diagnosis not present

## 2021-11-15 DIAGNOSIS — Z7682 Awaiting organ transplant status: Secondary | ICD-10-CM | POA: Diagnosis not present

## 2021-11-16 DIAGNOSIS — N186 End stage renal disease: Secondary | ICD-10-CM | POA: Diagnosis not present

## 2021-11-16 DIAGNOSIS — N2581 Secondary hyperparathyroidism of renal origin: Secondary | ICD-10-CM | POA: Diagnosis not present

## 2021-11-16 DIAGNOSIS — Z992 Dependence on renal dialysis: Secondary | ICD-10-CM | POA: Diagnosis not present

## 2021-11-18 DIAGNOSIS — Z992 Dependence on renal dialysis: Secondary | ICD-10-CM | POA: Diagnosis not present

## 2021-11-18 DIAGNOSIS — N2581 Secondary hyperparathyroidism of renal origin: Secondary | ICD-10-CM | POA: Diagnosis not present

## 2021-11-18 DIAGNOSIS — N186 End stage renal disease: Secondary | ICD-10-CM | POA: Diagnosis not present

## 2021-11-21 DIAGNOSIS — N2581 Secondary hyperparathyroidism of renal origin: Secondary | ICD-10-CM | POA: Diagnosis not present

## 2021-11-21 DIAGNOSIS — N186 End stage renal disease: Secondary | ICD-10-CM | POA: Diagnosis not present

## 2021-11-21 DIAGNOSIS — Z992 Dependence on renal dialysis: Secondary | ICD-10-CM | POA: Diagnosis not present

## 2021-11-23 DIAGNOSIS — Z992 Dependence on renal dialysis: Secondary | ICD-10-CM | POA: Diagnosis not present

## 2021-11-23 DIAGNOSIS — N2581 Secondary hyperparathyroidism of renal origin: Secondary | ICD-10-CM | POA: Diagnosis not present

## 2021-11-23 DIAGNOSIS — N186 End stage renal disease: Secondary | ICD-10-CM | POA: Diagnosis not present

## 2021-11-25 DIAGNOSIS — N186 End stage renal disease: Secondary | ICD-10-CM | POA: Diagnosis not present

## 2021-11-25 DIAGNOSIS — N2581 Secondary hyperparathyroidism of renal origin: Secondary | ICD-10-CM | POA: Diagnosis not present

## 2021-11-25 DIAGNOSIS — Z992 Dependence on renal dialysis: Secondary | ICD-10-CM | POA: Diagnosis not present

## 2021-11-28 DIAGNOSIS — N2581 Secondary hyperparathyroidism of renal origin: Secondary | ICD-10-CM | POA: Diagnosis not present

## 2021-11-28 DIAGNOSIS — N186 End stage renal disease: Secondary | ICD-10-CM | POA: Diagnosis not present

## 2021-11-28 DIAGNOSIS — Z992 Dependence on renal dialysis: Secondary | ICD-10-CM | POA: Diagnosis not present

## 2021-11-30 DIAGNOSIS — N2581 Secondary hyperparathyroidism of renal origin: Secondary | ICD-10-CM | POA: Diagnosis not present

## 2021-11-30 DIAGNOSIS — N186 End stage renal disease: Secondary | ICD-10-CM | POA: Diagnosis not present

## 2021-11-30 DIAGNOSIS — Z992 Dependence on renal dialysis: Secondary | ICD-10-CM | POA: Diagnosis not present

## 2021-12-02 DIAGNOSIS — Z992 Dependence on renal dialysis: Secondary | ICD-10-CM | POA: Diagnosis not present

## 2021-12-02 DIAGNOSIS — N2581 Secondary hyperparathyroidism of renal origin: Secondary | ICD-10-CM | POA: Diagnosis not present

## 2021-12-02 DIAGNOSIS — N186 End stage renal disease: Secondary | ICD-10-CM | POA: Diagnosis not present

## 2021-12-05 DIAGNOSIS — N2581 Secondary hyperparathyroidism of renal origin: Secondary | ICD-10-CM | POA: Diagnosis not present

## 2021-12-05 DIAGNOSIS — N186 End stage renal disease: Secondary | ICD-10-CM | POA: Diagnosis not present

## 2021-12-05 DIAGNOSIS — Z992 Dependence on renal dialysis: Secondary | ICD-10-CM | POA: Diagnosis not present

## 2021-12-07 DIAGNOSIS — N186 End stage renal disease: Secondary | ICD-10-CM | POA: Diagnosis not present

## 2021-12-07 DIAGNOSIS — N2581 Secondary hyperparathyroidism of renal origin: Secondary | ICD-10-CM | POA: Diagnosis not present

## 2021-12-07 DIAGNOSIS — Z992 Dependence on renal dialysis: Secondary | ICD-10-CM | POA: Diagnosis not present

## 2021-12-09 DIAGNOSIS — N2581 Secondary hyperparathyroidism of renal origin: Secondary | ICD-10-CM | POA: Diagnosis not present

## 2021-12-09 DIAGNOSIS — Z992 Dependence on renal dialysis: Secondary | ICD-10-CM | POA: Diagnosis not present

## 2021-12-09 DIAGNOSIS — N186 End stage renal disease: Secondary | ICD-10-CM | POA: Diagnosis not present

## 2021-12-12 DIAGNOSIS — Z992 Dependence on renal dialysis: Secondary | ICD-10-CM | POA: Diagnosis not present

## 2021-12-12 DIAGNOSIS — N186 End stage renal disease: Secondary | ICD-10-CM | POA: Diagnosis not present

## 2021-12-12 DIAGNOSIS — N2581 Secondary hyperparathyroidism of renal origin: Secondary | ICD-10-CM | POA: Diagnosis not present

## 2021-12-13 DIAGNOSIS — T8610 Unspecified complication of kidney transplant: Secondary | ICD-10-CM | POA: Diagnosis not present

## 2021-12-13 DIAGNOSIS — Z992 Dependence on renal dialysis: Secondary | ICD-10-CM | POA: Diagnosis not present

## 2021-12-13 DIAGNOSIS — N186 End stage renal disease: Secondary | ICD-10-CM | POA: Diagnosis not present

## 2021-12-14 DIAGNOSIS — N186 End stage renal disease: Secondary | ICD-10-CM | POA: Diagnosis not present

## 2021-12-14 DIAGNOSIS — Z992 Dependence on renal dialysis: Secondary | ICD-10-CM | POA: Diagnosis not present

## 2021-12-14 DIAGNOSIS — N2581 Secondary hyperparathyroidism of renal origin: Secondary | ICD-10-CM | POA: Diagnosis not present

## 2021-12-16 DIAGNOSIS — N186 End stage renal disease: Secondary | ICD-10-CM | POA: Diagnosis not present

## 2021-12-16 DIAGNOSIS — N2581 Secondary hyperparathyroidism of renal origin: Secondary | ICD-10-CM | POA: Diagnosis not present

## 2021-12-16 DIAGNOSIS — Z992 Dependence on renal dialysis: Secondary | ICD-10-CM | POA: Diagnosis not present

## 2021-12-19 DIAGNOSIS — N186 End stage renal disease: Secondary | ICD-10-CM | POA: Diagnosis not present

## 2021-12-19 DIAGNOSIS — N2581 Secondary hyperparathyroidism of renal origin: Secondary | ICD-10-CM | POA: Diagnosis not present

## 2021-12-19 DIAGNOSIS — Z992 Dependence on renal dialysis: Secondary | ICD-10-CM | POA: Diagnosis not present

## 2021-12-21 DIAGNOSIS — N186 End stage renal disease: Secondary | ICD-10-CM | POA: Diagnosis not present

## 2021-12-21 DIAGNOSIS — N2581 Secondary hyperparathyroidism of renal origin: Secondary | ICD-10-CM | POA: Diagnosis not present

## 2021-12-21 DIAGNOSIS — Z992 Dependence on renal dialysis: Secondary | ICD-10-CM | POA: Diagnosis not present

## 2021-12-23 DIAGNOSIS — N2581 Secondary hyperparathyroidism of renal origin: Secondary | ICD-10-CM | POA: Diagnosis not present

## 2021-12-23 DIAGNOSIS — Z992 Dependence on renal dialysis: Secondary | ICD-10-CM | POA: Diagnosis not present

## 2021-12-23 DIAGNOSIS — N186 End stage renal disease: Secondary | ICD-10-CM | POA: Diagnosis not present

## 2021-12-26 DIAGNOSIS — N186 End stage renal disease: Secondary | ICD-10-CM | POA: Diagnosis not present

## 2021-12-26 DIAGNOSIS — Z992 Dependence on renal dialysis: Secondary | ICD-10-CM | POA: Diagnosis not present

## 2021-12-26 DIAGNOSIS — N2581 Secondary hyperparathyroidism of renal origin: Secondary | ICD-10-CM | POA: Diagnosis not present

## 2021-12-28 DIAGNOSIS — Z992 Dependence on renal dialysis: Secondary | ICD-10-CM | POA: Diagnosis not present

## 2021-12-28 DIAGNOSIS — N2581 Secondary hyperparathyroidism of renal origin: Secondary | ICD-10-CM | POA: Diagnosis not present

## 2021-12-28 DIAGNOSIS — N186 End stage renal disease: Secondary | ICD-10-CM | POA: Diagnosis not present

## 2021-12-30 DIAGNOSIS — N186 End stage renal disease: Secondary | ICD-10-CM | POA: Diagnosis not present

## 2021-12-30 DIAGNOSIS — Z992 Dependence on renal dialysis: Secondary | ICD-10-CM | POA: Diagnosis not present

## 2021-12-30 DIAGNOSIS — N2581 Secondary hyperparathyroidism of renal origin: Secondary | ICD-10-CM | POA: Diagnosis not present

## 2022-01-02 DIAGNOSIS — N186 End stage renal disease: Secondary | ICD-10-CM | POA: Diagnosis not present

## 2022-01-02 DIAGNOSIS — Z992 Dependence on renal dialysis: Secondary | ICD-10-CM | POA: Diagnosis not present

## 2022-01-02 DIAGNOSIS — N2581 Secondary hyperparathyroidism of renal origin: Secondary | ICD-10-CM | POA: Diagnosis not present

## 2022-01-04 DIAGNOSIS — N2581 Secondary hyperparathyroidism of renal origin: Secondary | ICD-10-CM | POA: Diagnosis not present

## 2022-01-04 DIAGNOSIS — Z992 Dependence on renal dialysis: Secondary | ICD-10-CM | POA: Diagnosis not present

## 2022-01-04 DIAGNOSIS — N186 End stage renal disease: Secondary | ICD-10-CM | POA: Diagnosis not present

## 2022-01-05 DIAGNOSIS — M67442 Ganglion, left hand: Secondary | ICD-10-CM | POA: Diagnosis not present

## 2022-01-05 DIAGNOSIS — M79645 Pain in left finger(s): Secondary | ICD-10-CM | POA: Diagnosis not present

## 2022-01-06 DIAGNOSIS — N2581 Secondary hyperparathyroidism of renal origin: Secondary | ICD-10-CM | POA: Diagnosis not present

## 2022-01-06 DIAGNOSIS — N186 End stage renal disease: Secondary | ICD-10-CM | POA: Diagnosis not present

## 2022-01-06 DIAGNOSIS — Z992 Dependence on renal dialysis: Secondary | ICD-10-CM | POA: Diagnosis not present

## 2022-01-09 DIAGNOSIS — N186 End stage renal disease: Secondary | ICD-10-CM | POA: Diagnosis not present

## 2022-01-09 DIAGNOSIS — Z992 Dependence on renal dialysis: Secondary | ICD-10-CM | POA: Diagnosis not present

## 2022-01-09 DIAGNOSIS — N2581 Secondary hyperparathyroidism of renal origin: Secondary | ICD-10-CM | POA: Diagnosis not present

## 2022-01-11 DIAGNOSIS — N186 End stage renal disease: Secondary | ICD-10-CM | POA: Diagnosis not present

## 2022-01-11 DIAGNOSIS — Z992 Dependence on renal dialysis: Secondary | ICD-10-CM | POA: Diagnosis not present

## 2022-01-11 DIAGNOSIS — N2581 Secondary hyperparathyroidism of renal origin: Secondary | ICD-10-CM | POA: Diagnosis not present

## 2022-01-13 DIAGNOSIS — N186 End stage renal disease: Secondary | ICD-10-CM | POA: Diagnosis not present

## 2022-01-13 DIAGNOSIS — N2581 Secondary hyperparathyroidism of renal origin: Secondary | ICD-10-CM | POA: Diagnosis not present

## 2022-01-13 DIAGNOSIS — Z992 Dependence on renal dialysis: Secondary | ICD-10-CM | POA: Diagnosis not present

## 2022-01-13 DIAGNOSIS — T8610 Unspecified complication of kidney transplant: Secondary | ICD-10-CM | POA: Diagnosis not present

## 2022-01-16 DIAGNOSIS — Z992 Dependence on renal dialysis: Secondary | ICD-10-CM | POA: Diagnosis not present

## 2022-01-16 DIAGNOSIS — N2581 Secondary hyperparathyroidism of renal origin: Secondary | ICD-10-CM | POA: Diagnosis not present

## 2022-01-16 DIAGNOSIS — N186 End stage renal disease: Secondary | ICD-10-CM | POA: Diagnosis not present

## 2022-01-18 DIAGNOSIS — N2581 Secondary hyperparathyroidism of renal origin: Secondary | ICD-10-CM | POA: Diagnosis not present

## 2022-01-18 DIAGNOSIS — N186 End stage renal disease: Secondary | ICD-10-CM | POA: Diagnosis not present

## 2022-01-18 DIAGNOSIS — Z992 Dependence on renal dialysis: Secondary | ICD-10-CM | POA: Diagnosis not present

## 2022-01-19 DIAGNOSIS — N186 End stage renal disease: Secondary | ICD-10-CM | POA: Diagnosis not present

## 2022-01-19 DIAGNOSIS — Z992 Dependence on renal dialysis: Secondary | ICD-10-CM | POA: Diagnosis not present

## 2022-01-19 DIAGNOSIS — N2581 Secondary hyperparathyroidism of renal origin: Secondary | ICD-10-CM | POA: Diagnosis not present

## 2022-01-23 DIAGNOSIS — N186 End stage renal disease: Secondary | ICD-10-CM | POA: Diagnosis not present

## 2022-01-23 DIAGNOSIS — Z992 Dependence on renal dialysis: Secondary | ICD-10-CM | POA: Diagnosis not present

## 2022-01-23 DIAGNOSIS — N2581 Secondary hyperparathyroidism of renal origin: Secondary | ICD-10-CM | POA: Diagnosis not present

## 2022-01-25 DIAGNOSIS — N186 End stage renal disease: Secondary | ICD-10-CM | POA: Diagnosis not present

## 2022-01-25 DIAGNOSIS — N2581 Secondary hyperparathyroidism of renal origin: Secondary | ICD-10-CM | POA: Diagnosis not present

## 2022-01-25 DIAGNOSIS — Z992 Dependence on renal dialysis: Secondary | ICD-10-CM | POA: Diagnosis not present

## 2022-01-27 DIAGNOSIS — Z992 Dependence on renal dialysis: Secondary | ICD-10-CM | POA: Diagnosis not present

## 2022-01-27 DIAGNOSIS — N2581 Secondary hyperparathyroidism of renal origin: Secondary | ICD-10-CM | POA: Diagnosis not present

## 2022-01-27 DIAGNOSIS — N186 End stage renal disease: Secondary | ICD-10-CM | POA: Diagnosis not present

## 2022-01-30 DIAGNOSIS — N2581 Secondary hyperparathyroidism of renal origin: Secondary | ICD-10-CM | POA: Diagnosis not present

## 2022-01-30 DIAGNOSIS — Z992 Dependence on renal dialysis: Secondary | ICD-10-CM | POA: Diagnosis not present

## 2022-01-30 DIAGNOSIS — N186 End stage renal disease: Secondary | ICD-10-CM | POA: Diagnosis not present

## 2022-02-01 DIAGNOSIS — Z940 Kidney transplant status: Secondary | ICD-10-CM | POA: Diagnosis not present

## 2022-02-01 DIAGNOSIS — Z7682 Awaiting organ transplant status: Secondary | ICD-10-CM | POA: Diagnosis not present

## 2022-02-01 DIAGNOSIS — Z992 Dependence on renal dialysis: Secondary | ICD-10-CM | POA: Diagnosis not present

## 2022-02-01 DIAGNOSIS — N186 End stage renal disease: Secondary | ICD-10-CM | POA: Diagnosis not present

## 2022-02-01 DIAGNOSIS — N2581 Secondary hyperparathyroidism of renal origin: Secondary | ICD-10-CM | POA: Diagnosis not present

## 2022-02-03 DIAGNOSIS — Z992 Dependence on renal dialysis: Secondary | ICD-10-CM | POA: Diagnosis not present

## 2022-02-03 DIAGNOSIS — N2581 Secondary hyperparathyroidism of renal origin: Secondary | ICD-10-CM | POA: Diagnosis not present

## 2022-02-03 DIAGNOSIS — N186 End stage renal disease: Secondary | ICD-10-CM | POA: Diagnosis not present

## 2022-02-06 DIAGNOSIS — N2581 Secondary hyperparathyroidism of renal origin: Secondary | ICD-10-CM | POA: Diagnosis not present

## 2022-02-06 DIAGNOSIS — Z992 Dependence on renal dialysis: Secondary | ICD-10-CM | POA: Diagnosis not present

## 2022-02-06 DIAGNOSIS — N186 End stage renal disease: Secondary | ICD-10-CM | POA: Diagnosis not present

## 2022-02-08 DIAGNOSIS — N2581 Secondary hyperparathyroidism of renal origin: Secondary | ICD-10-CM | POA: Diagnosis not present

## 2022-02-08 DIAGNOSIS — N186 End stage renal disease: Secondary | ICD-10-CM | POA: Diagnosis not present

## 2022-02-08 DIAGNOSIS — Z992 Dependence on renal dialysis: Secondary | ICD-10-CM | POA: Diagnosis not present

## 2022-02-10 DIAGNOSIS — N2581 Secondary hyperparathyroidism of renal origin: Secondary | ICD-10-CM | POA: Diagnosis not present

## 2022-02-10 DIAGNOSIS — N186 End stage renal disease: Secondary | ICD-10-CM | POA: Diagnosis not present

## 2022-02-10 DIAGNOSIS — Z7682 Awaiting organ transplant status: Secondary | ICD-10-CM | POA: Diagnosis not present

## 2022-02-10 DIAGNOSIS — Z992 Dependence on renal dialysis: Secondary | ICD-10-CM | POA: Diagnosis not present

## 2022-02-12 DIAGNOSIS — N186 End stage renal disease: Secondary | ICD-10-CM | POA: Diagnosis not present

## 2022-02-12 DIAGNOSIS — Z992 Dependence on renal dialysis: Secondary | ICD-10-CM | POA: Diagnosis not present

## 2022-02-12 DIAGNOSIS — T8610 Unspecified complication of kidney transplant: Secondary | ICD-10-CM | POA: Diagnosis not present

## 2022-02-13 DIAGNOSIS — N2581 Secondary hyperparathyroidism of renal origin: Secondary | ICD-10-CM | POA: Diagnosis not present

## 2022-02-13 DIAGNOSIS — N186 End stage renal disease: Secondary | ICD-10-CM | POA: Diagnosis not present

## 2022-02-13 DIAGNOSIS — Z992 Dependence on renal dialysis: Secondary | ICD-10-CM | POA: Diagnosis not present

## 2022-02-15 DIAGNOSIS — N186 End stage renal disease: Secondary | ICD-10-CM | POA: Diagnosis not present

## 2022-02-15 DIAGNOSIS — N2581 Secondary hyperparathyroidism of renal origin: Secondary | ICD-10-CM | POA: Diagnosis not present

## 2022-02-15 DIAGNOSIS — Z992 Dependence on renal dialysis: Secondary | ICD-10-CM | POA: Diagnosis not present

## 2022-02-17 DIAGNOSIS — N2581 Secondary hyperparathyroidism of renal origin: Secondary | ICD-10-CM | POA: Diagnosis not present

## 2022-02-17 DIAGNOSIS — N186 End stage renal disease: Secondary | ICD-10-CM | POA: Diagnosis not present

## 2022-02-17 DIAGNOSIS — Z992 Dependence on renal dialysis: Secondary | ICD-10-CM | POA: Diagnosis not present

## 2022-02-20 DIAGNOSIS — Z992 Dependence on renal dialysis: Secondary | ICD-10-CM | POA: Diagnosis not present

## 2022-02-20 DIAGNOSIS — N186 End stage renal disease: Secondary | ICD-10-CM | POA: Diagnosis not present

## 2022-02-20 DIAGNOSIS — N2581 Secondary hyperparathyroidism of renal origin: Secondary | ICD-10-CM | POA: Diagnosis not present

## 2022-02-22 DIAGNOSIS — Z992 Dependence on renal dialysis: Secondary | ICD-10-CM | POA: Diagnosis not present

## 2022-02-22 DIAGNOSIS — N186 End stage renal disease: Secondary | ICD-10-CM | POA: Diagnosis not present

## 2022-02-22 DIAGNOSIS — N2581 Secondary hyperparathyroidism of renal origin: Secondary | ICD-10-CM | POA: Diagnosis not present

## 2022-02-24 DIAGNOSIS — N186 End stage renal disease: Secondary | ICD-10-CM | POA: Diagnosis not present

## 2022-02-24 DIAGNOSIS — N2581 Secondary hyperparathyroidism of renal origin: Secondary | ICD-10-CM | POA: Diagnosis not present

## 2022-02-24 DIAGNOSIS — Z992 Dependence on renal dialysis: Secondary | ICD-10-CM | POA: Diagnosis not present

## 2022-02-26 DIAGNOSIS — Z940 Kidney transplant status: Secondary | ICD-10-CM | POA: Diagnosis not present

## 2022-02-26 DIAGNOSIS — R739 Hyperglycemia, unspecified: Secondary | ICD-10-CM | POA: Diagnosis not present

## 2022-02-26 DIAGNOSIS — J45909 Unspecified asthma, uncomplicated: Secondary | ICD-10-CM | POA: Diagnosis not present

## 2022-02-26 DIAGNOSIS — Z992 Dependence on renal dialysis: Secondary | ICD-10-CM | POA: Diagnosis not present

## 2022-02-26 DIAGNOSIS — T380X5A Adverse effect of glucocorticoids and synthetic analogues, initial encounter: Secondary | ICD-10-CM | POA: Diagnosis not present

## 2022-02-26 DIAGNOSIS — Z79899 Other long term (current) drug therapy: Secondary | ICD-10-CM | POA: Diagnosis not present

## 2022-02-26 DIAGNOSIS — Z792 Long term (current) use of antibiotics: Secondary | ICD-10-CM | POA: Diagnosis not present

## 2022-02-26 DIAGNOSIS — Z7682 Awaiting organ transplant status: Secondary | ICD-10-CM | POA: Diagnosis not present

## 2022-02-26 DIAGNOSIS — N186 End stage renal disease: Secondary | ICD-10-CM | POA: Diagnosis not present

## 2022-02-26 DIAGNOSIS — R519 Headache, unspecified: Secondary | ICD-10-CM | POA: Diagnosis not present

## 2022-02-26 DIAGNOSIS — Z20822 Contact with and (suspected) exposure to covid-19: Secondary | ICD-10-CM | POA: Diagnosis not present

## 2022-02-26 DIAGNOSIS — D849 Immunodeficiency, unspecified: Secondary | ICD-10-CM | POA: Diagnosis not present

## 2022-02-26 DIAGNOSIS — J90 Pleural effusion, not elsewhere classified: Secondary | ICD-10-CM | POA: Diagnosis not present

## 2022-02-26 DIAGNOSIS — I10 Essential (primary) hypertension: Secondary | ICD-10-CM | POA: Diagnosis not present

## 2022-02-26 DIAGNOSIS — Z960 Presence of urogenital implants: Secondary | ICD-10-CM | POA: Diagnosis not present

## 2022-02-26 DIAGNOSIS — D84821 Immunodeficiency due to drugs: Secondary | ICD-10-CM | POA: Diagnosis not present

## 2022-02-26 DIAGNOSIS — I120 Hypertensive chronic kidney disease with stage 5 chronic kidney disease or end stage renal disease: Secondary | ICD-10-CM | POA: Diagnosis not present

## 2022-02-26 DIAGNOSIS — J9811 Atelectasis: Secondary | ICD-10-CM | POA: Diagnosis not present

## 2022-02-26 DIAGNOSIS — T8612 Kidney transplant failure: Secondary | ICD-10-CM | POA: Diagnosis not present

## 2022-02-27 DIAGNOSIS — Z940 Kidney transplant status: Secondary | ICD-10-CM | POA: Diagnosis not present

## 2022-02-27 DIAGNOSIS — Z792 Long term (current) use of antibiotics: Secondary | ICD-10-CM | POA: Diagnosis not present

## 2022-02-27 DIAGNOSIS — T8612 Kidney transplant failure: Secondary | ICD-10-CM | POA: Diagnosis not present

## 2022-02-27 DIAGNOSIS — N186 End stage renal disease: Secondary | ICD-10-CM | POA: Diagnosis not present

## 2022-02-27 DIAGNOSIS — I120 Hypertensive chronic kidney disease with stage 5 chronic kidney disease or end stage renal disease: Secondary | ICD-10-CM | POA: Diagnosis not present

## 2022-02-27 DIAGNOSIS — I10 Essential (primary) hypertension: Secondary | ICD-10-CM | POA: Diagnosis not present

## 2022-02-27 DIAGNOSIS — D849 Immunodeficiency, unspecified: Secondary | ICD-10-CM | POA: Diagnosis not present

## 2022-02-27 DIAGNOSIS — Z992 Dependence on renal dialysis: Secondary | ICD-10-CM | POA: Diagnosis not present

## 2022-02-28 DIAGNOSIS — T380X5A Adverse effect of glucocorticoids and synthetic analogues, initial encounter: Secondary | ICD-10-CM | POA: Diagnosis not present

## 2022-02-28 DIAGNOSIS — R739 Hyperglycemia, unspecified: Secondary | ICD-10-CM | POA: Diagnosis not present

## 2022-02-28 DIAGNOSIS — Z940 Kidney transplant status: Secondary | ICD-10-CM | POA: Diagnosis not present

## 2022-02-28 DIAGNOSIS — Z792 Long term (current) use of antibiotics: Secondary | ICD-10-CM | POA: Diagnosis not present

## 2022-02-28 DIAGNOSIS — T8612 Kidney transplant failure: Secondary | ICD-10-CM | POA: Diagnosis not present

## 2022-02-28 DIAGNOSIS — Z960 Presence of urogenital implants: Secondary | ICD-10-CM | POA: Diagnosis not present

## 2022-02-28 DIAGNOSIS — D849 Immunodeficiency, unspecified: Secondary | ICD-10-CM | POA: Diagnosis not present

## 2022-02-28 DIAGNOSIS — I10 Essential (primary) hypertension: Secondary | ICD-10-CM | POA: Diagnosis not present

## 2022-02-28 DIAGNOSIS — I120 Hypertensive chronic kidney disease with stage 5 chronic kidney disease or end stage renal disease: Secondary | ICD-10-CM | POA: Diagnosis not present

## 2022-03-01 DIAGNOSIS — I120 Hypertensive chronic kidney disease with stage 5 chronic kidney disease or end stage renal disease: Secondary | ICD-10-CM | POA: Diagnosis not present

## 2022-03-01 DIAGNOSIS — Z792 Long term (current) use of antibiotics: Secondary | ICD-10-CM | POA: Diagnosis not present

## 2022-03-01 DIAGNOSIS — I10 Essential (primary) hypertension: Secondary | ICD-10-CM | POA: Diagnosis not present

## 2022-03-01 DIAGNOSIS — D849 Immunodeficiency, unspecified: Secondary | ICD-10-CM | POA: Diagnosis not present

## 2022-03-01 DIAGNOSIS — T8612 Kidney transplant failure: Secondary | ICD-10-CM | POA: Diagnosis not present

## 2022-03-01 DIAGNOSIS — Z940 Kidney transplant status: Secondary | ICD-10-CM | POA: Diagnosis not present

## 2022-03-02 DIAGNOSIS — Z792 Long term (current) use of antibiotics: Secondary | ICD-10-CM | POA: Diagnosis not present

## 2022-03-02 DIAGNOSIS — T8612 Kidney transplant failure: Secondary | ICD-10-CM | POA: Diagnosis not present

## 2022-03-02 DIAGNOSIS — I120 Hypertensive chronic kidney disease with stage 5 chronic kidney disease or end stage renal disease: Secondary | ICD-10-CM | POA: Diagnosis not present

## 2022-03-02 DIAGNOSIS — D849 Immunodeficiency, unspecified: Secondary | ICD-10-CM | POA: Diagnosis not present

## 2022-03-02 DIAGNOSIS — Z7682 Awaiting organ transplant status: Secondary | ICD-10-CM | POA: Diagnosis not present

## 2022-03-02 DIAGNOSIS — Z940 Kidney transplant status: Secondary | ICD-10-CM | POA: Diagnosis not present

## 2022-03-02 DIAGNOSIS — I10 Essential (primary) hypertension: Secondary | ICD-10-CM | POA: Diagnosis not present

## 2022-03-03 DIAGNOSIS — I120 Hypertensive chronic kidney disease with stage 5 chronic kidney disease or end stage renal disease: Secondary | ICD-10-CM | POA: Diagnosis not present

## 2022-03-03 DIAGNOSIS — Z940 Kidney transplant status: Secondary | ICD-10-CM | POA: Diagnosis not present

## 2022-03-03 DIAGNOSIS — D849 Immunodeficiency, unspecified: Secondary | ICD-10-CM | POA: Diagnosis not present

## 2022-03-03 DIAGNOSIS — Z7682 Awaiting organ transplant status: Secondary | ICD-10-CM | POA: Diagnosis not present

## 2022-03-03 DIAGNOSIS — Z792 Long term (current) use of antibiotics: Secondary | ICD-10-CM | POA: Diagnosis not present

## 2022-03-03 DIAGNOSIS — I10 Essential (primary) hypertension: Secondary | ICD-10-CM | POA: Diagnosis not present

## 2022-03-03 DIAGNOSIS — T8612 Kidney transplant failure: Secondary | ICD-10-CM | POA: Diagnosis not present

## 2022-03-06 ENCOUNTER — Emergency Department (HOSPITAL_COMMUNITY)
Admission: EM | Admit: 2022-03-06 | Discharge: 2022-03-07 | Disposition: A | Discharge status: Home / Self Care | Payer: Medicare HMO | Attending: Emergency Medicine | Admitting: Emergency Medicine

## 2022-03-06 ENCOUNTER — Other Ambulatory Visit: Payer: Self-pay

## 2022-03-06 DIAGNOSIS — K5909 Other constipation: Secondary | ICD-10-CM | POA: Insufficient documentation

## 2022-03-06 DIAGNOSIS — R1030 Lower abdominal pain, unspecified: Secondary | ICD-10-CM | POA: Diagnosis not present

## 2022-03-06 DIAGNOSIS — K5900 Constipation, unspecified: Secondary | ICD-10-CM | POA: Diagnosis not present

## 2022-03-06 DIAGNOSIS — Z7982 Long term (current) use of aspirin: Secondary | ICD-10-CM | POA: Diagnosis not present

## 2022-03-06 DIAGNOSIS — Z5181 Encounter for therapeutic drug level monitoring: Secondary | ICD-10-CM | POA: Diagnosis not present

## 2022-03-06 DIAGNOSIS — Z940 Kidney transplant status: Secondary | ICD-10-CM | POA: Diagnosis not present

## 2022-03-06 DIAGNOSIS — I700 Atherosclerosis of aorta: Secondary | ICD-10-CM | POA: Diagnosis not present

## 2022-03-06 DIAGNOSIS — G8918 Other acute postprocedural pain: Secondary | ICD-10-CM | POA: Insufficient documentation

## 2022-03-06 DIAGNOSIS — R109 Unspecified abdominal pain: Secondary | ICD-10-CM | POA: Diagnosis present

## 2022-03-06 DIAGNOSIS — J449 Chronic obstructive pulmonary disease, unspecified: Secondary | ICD-10-CM | POA: Diagnosis not present

## 2022-03-06 NOTE — ED Provider Notes (Signed)
[facility] EMERGENCY DEPARTMENT Provider Note   CSN: [id number] Arrival date & time: 03/06/22  2311     History {Add pertinent medical, surgical, social history, OB history to HPI:1} No chief complaint on file.   Jonathon Conley is a 56 y.o. male.  The history is provided by the patient, the EMS personnel and medical records.  HARLAN ERVINE is a 56 y.o. male who presents to the Emergency Department complaining of abdominal pain.  He presents to the emergency department for evaluation of abdominal pain.  He is status post allograft for acute renal transplant performed at [facility] on May 15 discharged home on May 19.  He states that since the transplant he has had some pain at the surgical site.  He did have some issues with constipation while in the hospital, those were treated and he seems to be improved.  Since hospital discharge she has been compliant with his medications but has had problems with ongoing constipation.  Last BM was on Saturday and was what he describes as normal.  Today he developed worsening severe rectal pain that has been constant for the last 6 hours.  He has been experiencing difficulty passing flatus.  He has associated nausea but no vomiting.  No fevers.  He is urinating but he describes his urine as dark in color.  He did miss his evening medications.    Home Medications Prior to Admission medications   Medication Sig Start Date End Date Taking? Authorizing Provider  amLODipine (NORVASC) 5 MG tablet Take 1 tablet (5 mg total) by mouth in the morning and at bedtime. 01/22/20   [place], Peter M, MD  aspirin EC 81 MG tablet Take 81 mg by mouth daily.     [provider]  calcitRIOL (ROCALTROL) 0.5 MCG capsule Take 1 capsule (0.5 mcg total) by mouth Every Tuesday,Thursday,and Saturday with dialysis. 09/24/15   Orson Eva, MD  carvedilol (COREG) 25 MG tablet Take 25 mg by mouth 2 (two) times daily.     [provider]  cloNIDine (CATAPRES)  0.2 MG tablet Take 0.2 mg by mouth 2 (two) times daily. 12/26/19   [provider]  hydrALAZINE (APRESOLINE) 100 MG tablet Take 1 tablet (100 mg total) by mouth in the morning and at bedtime. Need appointment 04/12/21   Skeet Latch, MD  lactulose [facility]) 10 GM/15ML solution Take by mouth. 10/08/19   [provider]  sevelamer carbonate (RENVELA) 800 MG tablet Take 800 mg by mouth 3 (three) times daily with meals.    [provider]  simvastatin (ZOCOR) 5 MG tablet Take 5 mg by mouth Nightly. 11/21/17   [provider]  valsartan (DIOVAN) 320 MG tablet Take 1 tablet (320 mg total) by mouth daily. 02/25/20   Skeet Latch, MD      Allergies    Lisinopril    Review of Systems   Review of Systems  All other systems reviewed and are negative.  Physical Exam Updated Vital Signs There were no vitals taken for this visit. Physical Exam Vitals and nursing note reviewed.  Constitutional:      General: He is in acute distress.     Appearance: He is well-developed.     Comments: Uncomfortable appearing  HENT:     Head: Normocephalic and atraumatic.  Cardiovascular:     Rate and Rhythm: Normal rate and regular rhythm.     Heart sounds: No murmur heard. Pulmonary:     Effort: Pulmonary effort is  normal. No respiratory distress.     Breath sounds: Normal breath sounds.  Abdominal:     Palpations: Abdomen is soft.     Tenderness: There is no guarding or rebound.     Comments: Surgical site in the left lower quadrant clean, dry, intact.  There is appropriate mild tenderness in the left lower quadrant without peritoneal findings.  Genitourinary:    Comments: Soft nonthrombosed external hemorrhoids.  There is tenderness on DRE without any palpable masses, brown stool in the rectal vault. Musculoskeletal:        General: No tenderness.  Skin:    General: Skin is warm and dry.  Neurological:     Mental Status: He is alert and oriented to person,  place, and time.  Psychiatric:        Behavior: Behavior normal.    ED Results / Procedures / Treatments   Labs (all labs ordered are listed, but only abnormal results are displayed) Labs Reviewed - No data to display  EKG None  Radiology No results found.  Procedures Procedures  {Document cardiac monitor, telemetry assessment procedure when appropriate:1}  Medications Ordered in ED Medications - No data to display  ED Course/ Medical Decision Making/ A&P                           Medical Decision Making Amount and/or Complexity of Data Reviewed Labs: ordered.   ***  {Document critical care time when appropriate:1} {Document review of labs and clinical decision tools ie heart score, Chads2Vasc2 etc:1}  {Document your independent review of radiology images, and any outside records:1} {Document your discussion with family members, caretakers, and with consultants:1} {Document social determinants of health affecting pt's care:1} {Document your decision making why or why not admission, treatments were needed:1} Final Clinical Impression(s) / ED Diagnoses Final diagnoses:  None    Rx / DC Orders ED Discharge Orders     None

## 2022-03-06 NOTE — ED Triage Notes (Signed)
PT BIB [facility] for Abd pain.  Pt had kidney transplant last Monday at [facility] and was DC last Friday. Pt has been taking his senna regularly but has had trouble with BM's.  Pt had some constipation at [facility]. Last pertinent BM was Saturday afternoon.    EMS spoke with Dr. Wynetta Emery at [facility] who stated they could take OTC laxitives.  The pt and family were not aware of that per EMS.    EMS vitals 140/90 on scene, 157/92 on arrival, 100%RA, HR 87.  PT as afebrile for EMS.

## 2022-03-07 ENCOUNTER — Emergency Department (HOSPITAL_COMMUNITY): Payer: Medicare HMO

## 2022-03-07 ENCOUNTER — Other Ambulatory Visit: Payer: Self-pay

## 2022-03-07 DIAGNOSIS — R1030 Lower abdominal pain, unspecified: Secondary | ICD-10-CM | POA: Diagnosis not present

## 2022-03-07 DIAGNOSIS — I700 Atherosclerosis of aorta: Secondary | ICD-10-CM | POA: Diagnosis not present

## 2022-03-07 LAB — URINALYSIS, ROUTINE W REFLEX MICROSCOPIC
Bacteria, UA: NONE SEEN
Bilirubin Urine: NEGATIVE
Glucose, UA: 150 mg/dL — AB
Ketones, ur: NEGATIVE mg/dL
Leukocytes,Ua: NEGATIVE
Nitrite: NEGATIVE
Protein, ur: 30 mg/dL — AB
Specific Gravity, Urine: 1.018 (ref 1.005–1.030)
pH: 5 (ref 5.0–8.0)

## 2022-03-07 LAB — CBC WITH DIFFERENTIAL/PLATELET
Abs Immature Granulocytes: 0.09 10*3/uL — ABNORMAL HIGH (ref 0.00–0.07)
Basophils Absolute: 0 10*3/uL (ref 0.0–0.1)
Basophils Relative: 0 %
Eosinophils Absolute: 0 10*3/uL (ref 0.0–0.5)
Eosinophils Relative: 0 %
HCT: 30.4 % — ABNORMAL LOW (ref 39.0–52.0)
Hemoglobin: 10.2 g/dL — ABNORMAL LOW (ref 13.0–17.0)
Immature Granulocytes: 1 %
Lymphocytes Relative: 2 %
Lymphs Abs: 0.2 10*3/uL — ABNORMAL LOW (ref 0.7–4.0)
MCH: 29.2 pg (ref 26.0–34.0)
MCHC: 33.6 g/dL (ref 30.0–36.0)
MCV: 87.1 fL (ref 80.0–100.0)
Monocytes Absolute: 0.7 10*3/uL (ref 0.1–1.0)
Monocytes Relative: 10 %
Neutro Abs: 5.8 10*3/uL (ref 1.7–7.7)
Neutrophils Relative %: 87 %
Platelets: 150 10*3/uL (ref 150–400)
RBC: 3.49 MIL/uL — ABNORMAL LOW (ref 4.22–5.81)
RDW: 16.4 % — ABNORMAL HIGH (ref 11.5–15.5)
WBC: 6.7 10*3/uL (ref 4.0–10.5)
nRBC: 0 % (ref 0.0–0.2)

## 2022-03-07 LAB — COMPREHENSIVE METABOLIC PANEL
ALT: 11 U/L (ref 0–44)
AST: 13 U/L — ABNORMAL LOW (ref 15–41)
Albumin: 3.4 g/dL — ABNORMAL LOW (ref 3.5–5.0)
Alkaline Phosphatase: 86 U/L (ref 38–126)
Anion gap: 7 (ref 5–15)
BUN: 28 mg/dL — ABNORMAL HIGH (ref 6–20)
CO2: 19 mmol/L — ABNORMAL LOW (ref 22–32)
Calcium: 9.6 mg/dL (ref 8.9–10.3)
Chloride: 111 mmol/L (ref 98–111)
Creatinine, Ser: 1.41 mg/dL — ABNORMAL HIGH (ref 0.61–1.24)
GFR, Estimated: 59 mL/min — ABNORMAL LOW (ref >60)
Glucose, Bld: 137 mg/dL — ABNORMAL HIGH (ref 70–99)
Potassium: 4.4 mmol/L (ref 3.5–5.1)
Sodium: 137 mmol/L (ref 135–145)
Total Bilirubin: 1.2 mg/dL (ref 0.3–1.2)
Total Protein: 7.5 g/dL (ref 6.5–8.1)

## 2022-03-07 LAB — URINE CULTURE: Culture: NO GROWTH

## 2022-03-07 LAB — LIPASE, BLOOD: Lipase: 21 U/L (ref 11–51)

## 2022-03-07 MED ORDER — SODIUM CHLORIDE 0.9 % IV BOLUS
500.0000 mL | Freq: Once | INTRAVENOUS | Status: AC
  Administered 2022-03-07: 500 mL via INTRAVENOUS

## 2022-03-07 MED ORDER — ONDANSETRON HCL 4 MG/2ML IJ SOLN
4.0000 mg | Freq: Once | INTRAMUSCULAR | Status: AC
  Administered 2022-03-07: 4 mg via INTRAVENOUS
  Filled 2022-03-07: qty 2

## 2022-03-07 MED ORDER — HYDROMORPHONE HCL 1 MG/ML IJ SOLN
0.5000 mg | Freq: Once | INTRAMUSCULAR | Status: AC
  Administered 2022-03-07: 0.5 mg via INTRAVENOUS
  Filled 2022-03-07: qty 1

## 2022-03-07 NOTE — Patient Outreach (Signed)
[facility]) Care Management  03/07/2022  Jonathon Conley 12/11/1965 [id number]   Received referral-[facility]charge [facility] 03-07-22. Patient recent transplant from [facility] and part of their transplant care program and also part of [facility] transplant care management program as well.    Plan: RN CM will close case.    [name], RN, MSN [facility] Care Management Care Management Coordinator Direct Line [phone] Toll Free: [phone]  Fax: [phone]

## 2022-03-07 NOTE — ED Notes (Signed)
Patient transported to CT 

## 2022-03-07 NOTE — Patient Outreach (Signed)
[facility]) Care Management  03/07/2022  Jonathon Conley [birth year] [id number]   [employer] member referral from [facility] for transition of care needs. Request sent to Jon Billings, RN for follow up.  [facility] Management Assistant [phone]

## 2022-03-09 DIAGNOSIS — Z5181 Encounter for therapeutic drug level monitoring: Secondary | ICD-10-CM | POA: Diagnosis not present

## 2022-03-09 DIAGNOSIS — I10 Essential (primary) hypertension: Secondary | ICD-10-CM | POA: Diagnosis not present

## 2022-03-09 DIAGNOSIS — Z9189 Other specified personal risk factors, not elsewhere classified: Secondary | ICD-10-CM | POA: Diagnosis not present

## 2022-03-09 DIAGNOSIS — D849 Immunodeficiency, unspecified: Secondary | ICD-10-CM | POA: Diagnosis not present

## 2022-03-09 DIAGNOSIS — Z792 Long term (current) use of antibiotics: Secondary | ICD-10-CM | POA: Diagnosis not present

## 2022-03-09 DIAGNOSIS — D649 Anemia, unspecified: Secondary | ICD-10-CM | POA: Diagnosis not present

## 2022-03-09 DIAGNOSIS — K5900 Constipation, unspecified: Secondary | ICD-10-CM | POA: Diagnosis not present

## 2022-03-09 DIAGNOSIS — R0602 Shortness of breath: Secondary | ICD-10-CM | POA: Diagnosis not present

## 2022-03-09 DIAGNOSIS — G8918 Other acute postprocedural pain: Secondary | ICD-10-CM | POA: Diagnosis not present

## 2022-03-09 DIAGNOSIS — Z940 Kidney transplant status: Secondary | ICD-10-CM | POA: Diagnosis not present

## 2022-03-15 DIAGNOSIS — T8610 Unspecified complication of kidney transplant: Secondary | ICD-10-CM | POA: Diagnosis not present

## 2022-03-15 DIAGNOSIS — Z992 Dependence on renal dialysis: Secondary | ICD-10-CM | POA: Diagnosis not present

## 2022-03-15 DIAGNOSIS — N186 End stage renal disease: Secondary | ICD-10-CM | POA: Diagnosis not present

## 2022-03-16 DIAGNOSIS — Z792 Long term (current) use of antibiotics: Secondary | ICD-10-CM | POA: Diagnosis not present

## 2022-03-16 DIAGNOSIS — D84821 Immunodeficiency due to drugs: Secondary | ICD-10-CM | POA: Diagnosis not present

## 2022-03-16 DIAGNOSIS — R0602 Shortness of breath: Secondary | ICD-10-CM | POA: Diagnosis not present

## 2022-03-16 DIAGNOSIS — K5900 Constipation, unspecified: Secondary | ICD-10-CM | POA: Diagnosis not present

## 2022-03-16 DIAGNOSIS — Z4822 Encounter for aftercare following kidney transplant: Secondary | ICD-10-CM | POA: Diagnosis not present

## 2022-03-16 DIAGNOSIS — Z940 Kidney transplant status: Secondary | ICD-10-CM | POA: Diagnosis not present

## 2022-03-16 DIAGNOSIS — I10 Essential (primary) hypertension: Secondary | ICD-10-CM | POA: Diagnosis not present

## 2022-03-16 DIAGNOSIS — D849 Immunodeficiency, unspecified: Secondary | ICD-10-CM | POA: Diagnosis not present

## 2022-03-16 DIAGNOSIS — Z9189 Other specified personal risk factors, not elsewhere classified: Secondary | ICD-10-CM | POA: Diagnosis not present

## 2022-03-16 DIAGNOSIS — D649 Anemia, unspecified: Secondary | ICD-10-CM | POA: Diagnosis not present

## 2022-03-16 DIAGNOSIS — G8918 Other acute postprocedural pain: Secondary | ICD-10-CM | POA: Diagnosis not present

## 2022-03-30 DIAGNOSIS — D84821 Immunodeficiency due to drugs: Secondary | ICD-10-CM | POA: Diagnosis not present

## 2022-03-30 DIAGNOSIS — Z792 Long term (current) use of antibiotics: Secondary | ICD-10-CM | POA: Diagnosis not present

## 2022-03-30 DIAGNOSIS — D849 Immunodeficiency, unspecified: Secondary | ICD-10-CM | POA: Diagnosis not present

## 2022-03-30 DIAGNOSIS — Z940 Kidney transplant status: Secondary | ICD-10-CM | POA: Diagnosis not present

## 2022-03-30 DIAGNOSIS — Z7952 Long term (current) use of systemic steroids: Secondary | ICD-10-CM | POA: Diagnosis not present

## 2022-03-30 DIAGNOSIS — Z79624 Long term (current) use of inhibitors of nucleotide synthesis: Secondary | ICD-10-CM | POA: Diagnosis not present

## 2022-03-30 DIAGNOSIS — Z4822 Encounter for aftercare following kidney transplant: Secondary | ICD-10-CM | POA: Diagnosis not present

## 2022-03-30 DIAGNOSIS — I10 Essential (primary) hypertension: Secondary | ICD-10-CM | POA: Diagnosis not present

## 2022-03-30 DIAGNOSIS — T8612 Kidney transplant failure: Secondary | ICD-10-CM | POA: Diagnosis not present

## 2022-04-12 DIAGNOSIS — G4733 Obstructive sleep apnea (adult) (pediatric): Secondary | ICD-10-CM | POA: Diagnosis not present

## 2022-04-12 DIAGNOSIS — I10 Essential (primary) hypertension: Secondary | ICD-10-CM | POA: Diagnosis not present

## 2022-04-12 DIAGNOSIS — Z466 Encounter for fitting and adjustment of urinary device: Secondary | ICD-10-CM | POA: Diagnosis not present

## 2022-04-12 DIAGNOSIS — I2510 Atherosclerotic heart disease of native coronary artery without angina pectoris: Secondary | ICD-10-CM | POA: Diagnosis not present

## 2022-04-12 DIAGNOSIS — Z79899 Other long term (current) drug therapy: Secondary | ICD-10-CM | POA: Diagnosis not present

## 2022-04-12 DIAGNOSIS — Z792 Long term (current) use of antibiotics: Secondary | ICD-10-CM | POA: Diagnosis not present

## 2022-04-12 DIAGNOSIS — Z940 Kidney transplant status: Secondary | ICD-10-CM | POA: Diagnosis not present

## 2022-04-12 DIAGNOSIS — D849 Immunodeficiency, unspecified: Secondary | ICD-10-CM | POA: Diagnosis not present

## 2022-04-19 DIAGNOSIS — Z5181 Encounter for therapeutic drug level monitoring: Secondary | ICD-10-CM | POA: Diagnosis not present

## 2022-04-19 DIAGNOSIS — Z940 Kidney transplant status: Secondary | ICD-10-CM | POA: Diagnosis not present

## 2022-04-19 DIAGNOSIS — Z9483 Pancreas transplant status: Secondary | ICD-10-CM | POA: Diagnosis not present

## 2022-04-26 DIAGNOSIS — Z5181 Encounter for therapeutic drug level monitoring: Secondary | ICD-10-CM | POA: Diagnosis not present

## 2022-04-26 DIAGNOSIS — Z940 Kidney transplant status: Secondary | ICD-10-CM | POA: Diagnosis not present

## 2022-04-26 DIAGNOSIS — Z9483 Pancreas transplant status: Secondary | ICD-10-CM | POA: Diagnosis not present

## 2022-05-03 ENCOUNTER — Telehealth: Payer: Self-pay

## 2022-05-03 DIAGNOSIS — Z9483 Pancreas transplant status: Secondary | ICD-10-CM | POA: Diagnosis not present

## 2022-05-03 DIAGNOSIS — Z5181 Encounter for therapeutic drug level monitoring: Secondary | ICD-10-CM | POA: Diagnosis not present

## 2022-05-03 DIAGNOSIS — Z0000 Encounter for general adult medical examination without abnormal findings: Secondary | ICD-10-CM

## 2022-05-03 DIAGNOSIS — Z940 Kidney transplant status: Secondary | ICD-10-CM | POA: Diagnosis not present

## 2022-05-03 NOTE — Telephone Encounter (Signed)
Called patient to determine if he had returned the [place] cuff he was provided when participating in the program or if he still had access to it and need to return it. Patient did not answer. Left a message for patient to return call to provide update on status of cuff.    Neshia Mckenzie Truman Hayward, [facility] Center Guide, Health Coach [address] [place] [address] Telephone: [phone] Email: [email or web address]

## 2022-05-10 DIAGNOSIS — Z7952 Long term (current) use of systemic steroids: Secondary | ICD-10-CM | POA: Diagnosis not present

## 2022-05-10 DIAGNOSIS — I10 Essential (primary) hypertension: Secondary | ICD-10-CM | POA: Diagnosis not present

## 2022-05-10 DIAGNOSIS — Z79624 Long term (current) use of inhibitors of nucleotide synthesis: Secondary | ICD-10-CM | POA: Diagnosis not present

## 2022-05-10 DIAGNOSIS — I130 Hypertensive heart and chronic kidney disease with heart failure and stage 1 through stage 4 chronic kidney disease, or unspecified chronic kidney disease: Secondary | ICD-10-CM | POA: Diagnosis not present

## 2022-05-10 DIAGNOSIS — D84821 Immunodeficiency due to drugs: Secondary | ICD-10-CM | POA: Diagnosis not present

## 2022-05-10 DIAGNOSIS — Z940 Kidney transplant status: Secondary | ICD-10-CM | POA: Diagnosis not present

## 2022-05-10 DIAGNOSIS — Z4822 Encounter for aftercare following kidney transplant: Secondary | ICD-10-CM | POA: Diagnosis not present

## 2022-05-10 DIAGNOSIS — D849 Immunodeficiency, unspecified: Secondary | ICD-10-CM | POA: Diagnosis not present

## 2022-05-10 DIAGNOSIS — N189 Chronic kidney disease, unspecified: Secondary | ICD-10-CM | POA: Diagnosis not present

## 2022-05-10 DIAGNOSIS — Z792 Long term (current) use of antibiotics: Secondary | ICD-10-CM | POA: Diagnosis not present

## 2022-05-10 DIAGNOSIS — I509 Heart failure, unspecified: Secondary | ICD-10-CM | POA: Diagnosis not present

## 2022-05-24 DIAGNOSIS — Z9483 Pancreas transplant status: Secondary | ICD-10-CM | POA: Diagnosis not present

## 2022-05-24 DIAGNOSIS — Z940 Kidney transplant status: Secondary | ICD-10-CM | POA: Diagnosis not present

## 2022-05-24 DIAGNOSIS — Z5181 Encounter for therapeutic drug level monitoring: Secondary | ICD-10-CM | POA: Diagnosis not present

## 2022-05-31 DIAGNOSIS — Z5181 Encounter for therapeutic drug level monitoring: Secondary | ICD-10-CM | POA: Diagnosis not present

## 2022-05-31 DIAGNOSIS — Z9483 Pancreas transplant status: Secondary | ICD-10-CM | POA: Diagnosis not present

## 2022-05-31 DIAGNOSIS — Z940 Kidney transplant status: Secondary | ICD-10-CM | POA: Diagnosis not present

## 2022-06-07 DIAGNOSIS — R739 Hyperglycemia, unspecified: Secondary | ICD-10-CM | POA: Diagnosis not present

## 2022-06-07 DIAGNOSIS — N059 Unspecified nephritic syndrome with unspecified morphologic changes: Secondary | ICD-10-CM | POA: Diagnosis not present

## 2022-06-07 DIAGNOSIS — B348 Other viral infections of unspecified site: Secondary | ICD-10-CM | POA: Diagnosis not present

## 2022-06-07 DIAGNOSIS — D849 Immunodeficiency, unspecified: Secondary | ICD-10-CM | POA: Diagnosis not present

## 2022-06-07 DIAGNOSIS — A0472 Enterocolitis due to Clostridium difficile, not specified as recurrent: Secondary | ICD-10-CM | POA: Diagnosis not present

## 2022-06-07 DIAGNOSIS — D62 Acute posthemorrhagic anemia: Secondary | ICD-10-CM | POA: Diagnosis not present

## 2022-06-07 DIAGNOSIS — T8612 Kidney transplant failure: Secondary | ICD-10-CM | POA: Diagnosis not present

## 2022-06-07 DIAGNOSIS — R768 Other specified abnormal immunological findings in serum: Secondary | ICD-10-CM | POA: Diagnosis not present

## 2022-06-07 DIAGNOSIS — Z940 Kidney transplant status: Secondary | ICD-10-CM | POA: Diagnosis not present

## 2022-06-07 DIAGNOSIS — A4189 Other specified sepsis: Secondary | ICD-10-CM | POA: Diagnosis not present

## 2022-06-15 DIAGNOSIS — Z940 Kidney transplant status: Secondary | ICD-10-CM | POA: Diagnosis not present

## 2022-06-15 DIAGNOSIS — Z9483 Pancreas transplant status: Secondary | ICD-10-CM | POA: Diagnosis not present

## 2022-06-15 DIAGNOSIS — Z5181 Encounter for therapeutic drug level monitoring: Secondary | ICD-10-CM | POA: Diagnosis not present

## 2022-06-21 DIAGNOSIS — H524 Presbyopia: Secondary | ICD-10-CM | POA: Diagnosis not present

## 2022-06-21 DIAGNOSIS — H35372 Puckering of macula, left eye: Secondary | ICD-10-CM | POA: Diagnosis not present

## 2022-06-21 DIAGNOSIS — H5213 Myopia, bilateral: Secondary | ICD-10-CM | POA: Diagnosis not present

## 2022-06-21 DIAGNOSIS — H35033 Hypertensive retinopathy, bilateral: Secondary | ICD-10-CM | POA: Diagnosis not present

## 2022-06-21 DIAGNOSIS — H2513 Age-related nuclear cataract, bilateral: Secondary | ICD-10-CM | POA: Diagnosis not present

## 2022-07-05 DIAGNOSIS — R768 Other specified abnormal immunological findings in serum: Secondary | ICD-10-CM | POA: Diagnosis not present

## 2022-07-05 DIAGNOSIS — Z4822 Encounter for aftercare following kidney transplant: Secondary | ICD-10-CM | POA: Diagnosis not present

## 2022-07-05 DIAGNOSIS — I151 Hypertension secondary to other renal disorders: Secondary | ICD-10-CM | POA: Diagnosis not present

## 2022-07-05 DIAGNOSIS — Z940 Kidney transplant status: Secondary | ICD-10-CM | POA: Diagnosis not present

## 2022-07-05 DIAGNOSIS — I10 Essential (primary) hypertension: Secondary | ICD-10-CM | POA: Diagnosis not present

## 2022-07-05 DIAGNOSIS — N059 Unspecified nephritic syndrome with unspecified morphologic changes: Secondary | ICD-10-CM | POA: Diagnosis not present

## 2022-07-05 DIAGNOSIS — Z48298 Encounter for aftercare following other organ transplant: Secondary | ICD-10-CM | POA: Diagnosis not present

## 2022-07-05 DIAGNOSIS — Z23 Encounter for immunization: Secondary | ICD-10-CM | POA: Diagnosis not present

## 2022-07-05 DIAGNOSIS — T8619 Other complication of kidney transplant: Secondary | ICD-10-CM | POA: Diagnosis not present

## 2022-07-05 DIAGNOSIS — B348 Other viral infections of unspecified site: Secondary | ICD-10-CM | POA: Diagnosis not present

## 2022-07-05 DIAGNOSIS — R739 Hyperglycemia, unspecified: Secondary | ICD-10-CM | POA: Diagnosis not present

## 2022-07-05 DIAGNOSIS — D849 Immunodeficiency, unspecified: Secondary | ICD-10-CM | POA: Diagnosis not present

## 2022-07-05 DIAGNOSIS — A4189 Other specified sepsis: Secondary | ICD-10-CM | POA: Diagnosis not present

## 2022-07-05 DIAGNOSIS — D649 Anemia, unspecified: Secondary | ICD-10-CM | POA: Diagnosis not present

## 2022-07-13 DIAGNOSIS — Z940 Kidney transplant status: Secondary | ICD-10-CM | POA: Diagnosis not present

## 2022-07-13 DIAGNOSIS — Z5181 Encounter for therapeutic drug level monitoring: Secondary | ICD-10-CM | POA: Diagnosis not present

## 2022-07-13 DIAGNOSIS — Z9483 Pancreas transplant status: Secondary | ICD-10-CM | POA: Diagnosis not present

## 2022-07-21 DIAGNOSIS — Z5181 Encounter for therapeutic drug level monitoring: Secondary | ICD-10-CM | POA: Diagnosis not present

## 2022-07-21 DIAGNOSIS — Z940 Kidney transplant status: Secondary | ICD-10-CM | POA: Diagnosis not present

## 2022-07-21 DIAGNOSIS — Z9483 Pancreas transplant status: Secondary | ICD-10-CM | POA: Diagnosis not present

## 2022-07-27 DIAGNOSIS — Z9483 Pancreas transplant status: Secondary | ICD-10-CM | POA: Diagnosis not present

## 2022-07-27 DIAGNOSIS — Z940 Kidney transplant status: Secondary | ICD-10-CM | POA: Diagnosis not present

## 2022-07-27 DIAGNOSIS — Z5181 Encounter for therapeutic drug level monitoring: Secondary | ICD-10-CM | POA: Diagnosis not present

## 2022-08-02 DIAGNOSIS — Z792 Long term (current) use of antibiotics: Secondary | ICD-10-CM | POA: Diagnosis not present

## 2022-08-02 DIAGNOSIS — Z4822 Encounter for aftercare following kidney transplant: Secondary | ICD-10-CM | POA: Diagnosis not present

## 2022-08-02 DIAGNOSIS — R768 Other specified abnormal immunological findings in serum: Secondary | ICD-10-CM | POA: Diagnosis not present

## 2022-08-02 DIAGNOSIS — I10 Essential (primary) hypertension: Secondary | ICD-10-CM | POA: Diagnosis not present

## 2022-08-02 DIAGNOSIS — G4733 Obstructive sleep apnea (adult) (pediatric): Secondary | ICD-10-CM | POA: Diagnosis not present

## 2022-08-02 DIAGNOSIS — Z940 Kidney transplant status: Secondary | ICD-10-CM | POA: Diagnosis not present

## 2022-08-02 DIAGNOSIS — K429 Umbilical hernia without obstruction or gangrene: Secondary | ICD-10-CM | POA: Diagnosis not present

## 2022-08-02 DIAGNOSIS — D84821 Immunodeficiency due to drugs: Secondary | ICD-10-CM | POA: Diagnosis not present

## 2022-08-02 DIAGNOSIS — Z960 Presence of urogenital implants: Secondary | ICD-10-CM | POA: Diagnosis not present

## 2022-08-02 DIAGNOSIS — B348 Other viral infections of unspecified site: Secondary | ICD-10-CM | POA: Diagnosis not present

## 2022-08-02 DIAGNOSIS — I2510 Atherosclerotic heart disease of native coronary artery without angina pectoris: Secondary | ICD-10-CM | POA: Diagnosis not present

## 2022-08-02 DIAGNOSIS — D849 Immunodeficiency, unspecified: Secondary | ICD-10-CM | POA: Diagnosis not present

## 2022-08-02 DIAGNOSIS — Z91199 Patient's noncompliance with other medical treatment and regimen due to unspecified reason: Secondary | ICD-10-CM | POA: Diagnosis not present

## 2022-08-02 DIAGNOSIS — N059 Unspecified nephritic syndrome with unspecified morphologic changes: Secondary | ICD-10-CM | POA: Diagnosis not present

## 2022-08-02 DIAGNOSIS — T8612 Kidney transplant failure: Secondary | ICD-10-CM | POA: Diagnosis not present

## 2022-08-14 ENCOUNTER — Encounter (INDEPENDENT_AMBULATORY_CARE_PROVIDER_SITE_OTHER): Payer: Self-pay

## 2022-08-17 DIAGNOSIS — Z9483 Pancreas transplant status: Secondary | ICD-10-CM | POA: Diagnosis not present

## 2022-08-17 DIAGNOSIS — Z5181 Encounter for therapeutic drug level monitoring: Secondary | ICD-10-CM | POA: Diagnosis not present

## 2022-08-17 DIAGNOSIS — Z940 Kidney transplant status: Secondary | ICD-10-CM | POA: Diagnosis not present

## 2022-09-04 DIAGNOSIS — I10 Essential (primary) hypertension: Secondary | ICD-10-CM | POA: Diagnosis not present

## 2022-09-04 DIAGNOSIS — B348 Other viral infections of unspecified site: Secondary | ICD-10-CM | POA: Diagnosis not present

## 2022-09-04 DIAGNOSIS — I509 Heart failure, unspecified: Secondary | ICD-10-CM | POA: Diagnosis not present

## 2022-09-04 DIAGNOSIS — N059 Unspecified nephritic syndrome with unspecified morphologic changes: Secondary | ICD-10-CM | POA: Diagnosis not present

## 2022-09-04 DIAGNOSIS — Z792 Long term (current) use of antibiotics: Secondary | ICD-10-CM | POA: Diagnosis not present

## 2022-09-04 DIAGNOSIS — T8612 Kidney transplant failure: Secondary | ICD-10-CM | POA: Diagnosis not present

## 2022-09-04 DIAGNOSIS — Z940 Kidney transplant status: Secondary | ICD-10-CM | POA: Diagnosis not present

## 2022-09-04 DIAGNOSIS — I132 Hypertensive heart and chronic kidney disease with heart failure and with stage 5 chronic kidney disease, or end stage renal disease: Secondary | ICD-10-CM | POA: Diagnosis not present

## 2022-09-04 DIAGNOSIS — D849 Immunodeficiency, unspecified: Secondary | ICD-10-CM | POA: Diagnosis not present

## 2022-09-04 DIAGNOSIS — R768 Other specified abnormal immunological findings in serum: Secondary | ICD-10-CM | POA: Diagnosis not present

## 2022-09-04 DIAGNOSIS — K429 Umbilical hernia without obstruction or gangrene: Secondary | ICD-10-CM | POA: Diagnosis not present

## 2022-09-04 DIAGNOSIS — R739 Hyperglycemia, unspecified: Secondary | ICD-10-CM | POA: Diagnosis not present

## 2022-09-14 DIAGNOSIS — Z5181 Encounter for therapeutic drug level monitoring: Secondary | ICD-10-CM | POA: Diagnosis not present

## 2022-09-14 DIAGNOSIS — Z940 Kidney transplant status: Secondary | ICD-10-CM | POA: Diagnosis not present

## 2022-09-14 DIAGNOSIS — Z9483 Pancreas transplant status: Secondary | ICD-10-CM | POA: Diagnosis not present

## 2022-09-21 DIAGNOSIS — Z5181 Encounter for therapeutic drug level monitoring: Secondary | ICD-10-CM | POA: Diagnosis not present

## 2022-09-21 DIAGNOSIS — Z9483 Pancreas transplant status: Secondary | ICD-10-CM | POA: Diagnosis not present

## 2022-09-21 DIAGNOSIS — Z940 Kidney transplant status: Secondary | ICD-10-CM | POA: Diagnosis not present

## 2022-09-28 DIAGNOSIS — Z940 Kidney transplant status: Secondary | ICD-10-CM | POA: Diagnosis not present

## 2022-10-04 DIAGNOSIS — K429 Umbilical hernia without obstruction or gangrene: Secondary | ICD-10-CM | POA: Diagnosis not present

## 2022-10-04 DIAGNOSIS — B348 Other viral infections of unspecified site: Secondary | ICD-10-CM | POA: Diagnosis not present

## 2022-10-04 DIAGNOSIS — Z940 Kidney transplant status: Secondary | ICD-10-CM | POA: Diagnosis not present

## 2022-10-04 DIAGNOSIS — R739 Hyperglycemia, unspecified: Secondary | ICD-10-CM | POA: Diagnosis not present

## 2022-10-04 DIAGNOSIS — T8612 Kidney transplant failure: Secondary | ICD-10-CM | POA: Diagnosis not present

## 2022-10-04 DIAGNOSIS — D849 Immunodeficiency, unspecified: Secondary | ICD-10-CM | POA: Diagnosis not present

## 2022-10-04 DIAGNOSIS — R768 Other specified abnormal immunological findings in serum: Secondary | ICD-10-CM | POA: Diagnosis not present

## 2022-10-04 DIAGNOSIS — D638 Anemia in other chronic diseases classified elsewhere: Secondary | ICD-10-CM | POA: Diagnosis not present

## 2022-10-04 DIAGNOSIS — I120 Hypertensive chronic kidney disease with stage 5 chronic kidney disease or end stage renal disease: Secondary | ICD-10-CM | POA: Diagnosis not present

## 2022-10-04 DIAGNOSIS — A4189 Other specified sepsis: Secondary | ICD-10-CM | POA: Diagnosis not present

## 2022-10-05 DIAGNOSIS — R300 Dysuria: Secondary | ICD-10-CM | POA: Diagnosis not present

## 2022-10-05 DIAGNOSIS — R8290 Unspecified abnormal findings in urine: Secondary | ICD-10-CM | POA: Diagnosis not present

## 2022-10-17 DIAGNOSIS — Z940 Kidney transplant status: Secondary | ICD-10-CM | POA: Diagnosis not present

## 2022-10-26 DIAGNOSIS — Z940 Kidney transplant status: Secondary | ICD-10-CM | POA: Diagnosis not present

## 2022-11-10 DIAGNOSIS — K429 Umbilical hernia without obstruction or gangrene: Secondary | ICD-10-CM | POA: Diagnosis not present

## 2022-11-10 DIAGNOSIS — A4189 Other specified sepsis: Secondary | ICD-10-CM | POA: Diagnosis not present

## 2022-11-10 DIAGNOSIS — B348 Other viral infections of unspecified site: Secondary | ICD-10-CM | POA: Diagnosis not present

## 2022-11-10 DIAGNOSIS — R739 Hyperglycemia, unspecified: Secondary | ICD-10-CM | POA: Diagnosis not present

## 2022-11-10 DIAGNOSIS — R768 Other specified abnormal immunological findings in serum: Secondary | ICD-10-CM | POA: Diagnosis not present

## 2022-11-10 DIAGNOSIS — T8612 Kidney transplant failure: Secondary | ICD-10-CM | POA: Diagnosis not present

## 2022-11-10 DIAGNOSIS — D62 Acute posthemorrhagic anemia: Secondary | ICD-10-CM | POA: Diagnosis not present

## 2022-11-10 DIAGNOSIS — Z940 Kidney transplant status: Secondary | ICD-10-CM | POA: Diagnosis not present

## 2022-11-10 DIAGNOSIS — Z0100 Encounter for examination of eyes and vision without abnormal findings: Secondary | ICD-10-CM | POA: Diagnosis not present

## 2022-11-10 DIAGNOSIS — I10 Essential (primary) hypertension: Secondary | ICD-10-CM | POA: Diagnosis not present

## 2022-11-10 DIAGNOSIS — N059 Unspecified nephritic syndrome with unspecified morphologic changes: Secondary | ICD-10-CM | POA: Diagnosis not present

## 2022-11-21 DIAGNOSIS — Z940 Kidney transplant status: Secondary | ICD-10-CM | POA: Diagnosis not present

## 2022-12-08 DIAGNOSIS — Z79899 Other long term (current) drug therapy: Secondary | ICD-10-CM | POA: Diagnosis not present

## 2022-12-08 DIAGNOSIS — R768 Other specified abnormal immunological findings in serum: Secondary | ICD-10-CM | POA: Diagnosis not present

## 2022-12-08 DIAGNOSIS — Z940 Kidney transplant status: Secondary | ICD-10-CM | POA: Diagnosis not present

## 2022-12-08 DIAGNOSIS — Z4822 Encounter for aftercare following kidney transplant: Secondary | ICD-10-CM | POA: Diagnosis not present

## 2022-12-08 DIAGNOSIS — Z79621 Long term (current) use of calcineurin inhibitor: Secondary | ICD-10-CM | POA: Diagnosis not present

## 2022-12-08 DIAGNOSIS — B348 Other viral infections of unspecified site: Secondary | ICD-10-CM | POA: Diagnosis not present

## 2022-12-08 DIAGNOSIS — D84821 Immunodeficiency due to drugs: Secondary | ICD-10-CM | POA: Diagnosis not present

## 2022-12-08 DIAGNOSIS — I10 Essential (primary) hypertension: Secondary | ICD-10-CM | POA: Diagnosis not present

## 2022-12-08 DIAGNOSIS — Z7952 Long term (current) use of systemic steroids: Secondary | ICD-10-CM | POA: Diagnosis not present

## 2023-01-05 DIAGNOSIS — Z940 Kidney transplant status: Secondary | ICD-10-CM | POA: Diagnosis not present

## 2023-01-05 DIAGNOSIS — Z4822 Encounter for aftercare following kidney transplant: Secondary | ICD-10-CM | POA: Diagnosis not present

## 2023-01-05 DIAGNOSIS — Z792 Long term (current) use of antibiotics: Secondary | ICD-10-CM | POA: Diagnosis not present

## 2023-01-05 DIAGNOSIS — Z79621 Long term (current) use of calcineurin inhibitor: Secondary | ICD-10-CM | POA: Diagnosis not present

## 2023-01-05 DIAGNOSIS — N059 Unspecified nephritic syndrome with unspecified morphologic changes: Secondary | ICD-10-CM | POA: Diagnosis not present

## 2023-01-05 DIAGNOSIS — D849 Immunodeficiency, unspecified: Secondary | ICD-10-CM | POA: Diagnosis not present

## 2023-01-05 DIAGNOSIS — B348 Other viral infections of unspecified site: Secondary | ICD-10-CM | POA: Diagnosis not present

## 2023-01-05 DIAGNOSIS — I10 Essential (primary) hypertension: Secondary | ICD-10-CM | POA: Diagnosis not present

## 2023-01-05 DIAGNOSIS — D84821 Immunodeficiency due to drugs: Secondary | ICD-10-CM | POA: Diagnosis not present

## 2023-01-05 DIAGNOSIS — R768 Other specified abnormal immunological findings in serum: Secondary | ICD-10-CM | POA: Diagnosis not present

## 2023-01-11 DIAGNOSIS — Z940 Kidney transplant status: Secondary | ICD-10-CM | POA: Diagnosis not present

## 2023-02-04 DIAGNOSIS — J301 Allergic rhinitis due to pollen: Secondary | ICD-10-CM | POA: Diagnosis not present

## 2023-02-07 DIAGNOSIS — Z940 Kidney transplant status: Secondary | ICD-10-CM | POA: Diagnosis not present

## 2023-02-22 DIAGNOSIS — R739 Hyperglycemia, unspecified: Secondary | ICD-10-CM | POA: Diagnosis not present

## 2023-02-22 DIAGNOSIS — B348 Other viral infections of unspecified site: Secondary | ICD-10-CM | POA: Diagnosis not present

## 2023-02-22 DIAGNOSIS — R768 Other specified abnormal immunological findings in serum: Secondary | ICD-10-CM | POA: Diagnosis not present

## 2023-02-22 DIAGNOSIS — D84821 Immunodeficiency due to drugs: Secondary | ICD-10-CM | POA: Diagnosis not present

## 2023-02-22 DIAGNOSIS — Z4822 Encounter for aftercare following kidney transplant: Secondary | ICD-10-CM | POA: Diagnosis not present

## 2023-02-22 DIAGNOSIS — K429 Umbilical hernia without obstruction or gangrene: Secondary | ICD-10-CM | POA: Diagnosis not present

## 2023-02-22 DIAGNOSIS — D849 Immunodeficiency, unspecified: Secondary | ICD-10-CM | POA: Diagnosis not present

## 2023-02-22 DIAGNOSIS — T380X5A Adverse effect of glucocorticoids and synthetic analogues, initial encounter: Secondary | ICD-10-CM | POA: Diagnosis not present

## 2023-02-22 DIAGNOSIS — Z960 Presence of urogenital implants: Secondary | ICD-10-CM | POA: Diagnosis not present

## 2023-02-22 DIAGNOSIS — Z792 Long term (current) use of antibiotics: Secondary | ICD-10-CM | POA: Diagnosis not present

## 2023-02-22 DIAGNOSIS — Z940 Kidney transplant status: Secondary | ICD-10-CM | POA: Diagnosis not present

## 2023-03-01 ENCOUNTER — Encounter: Payer: Self-pay | Admitting: Pulmonary Disease

## 2023-03-01 ENCOUNTER — Ambulatory Visit (INDEPENDENT_AMBULATORY_CARE_PROVIDER_SITE_OTHER): Payer: Medicare HMO | Admitting: Pulmonary Disease

## 2023-03-01 VITALS — BP 116/84 | HR 81 | Ht 70.0 in | Wt 196.0 lb

## 2023-03-01 DIAGNOSIS — R0602 Shortness of breath: Secondary | ICD-10-CM

## 2023-03-01 DIAGNOSIS — I509 Heart failure, unspecified: Secondary | ICD-10-CM | POA: Diagnosis not present

## 2023-03-01 DIAGNOSIS — I5032 Chronic diastolic (congestive) heart failure: Secondary | ICD-10-CM | POA: Diagnosis not present

## 2023-03-01 DIAGNOSIS — J449 Chronic obstructive pulmonary disease, unspecified: Secondary | ICD-10-CM | POA: Diagnosis not present

## 2023-03-01 DIAGNOSIS — Z992 Dependence on renal dialysis: Secondary | ICD-10-CM

## 2023-03-01 DIAGNOSIS — G4733 Obstructive sleep apnea (adult) (pediatric): Secondary | ICD-10-CM | POA: Diagnosis not present

## 2023-03-01 DIAGNOSIS — Z940 Kidney transplant status: Secondary | ICD-10-CM

## 2023-03-01 DIAGNOSIS — N186 End stage renal disease: Secondary | ICD-10-CM | POA: Diagnosis not present

## 2023-03-01 NOTE — Progress Notes (Signed)
Jonathon Conley    [id number]    1966-09-10  Primary Care Physician:Jegede, Phylliss Blakes, MD  Referring Physician: Quentin Angst, MD [address] [place],  [place] [address]  Chief complaint:   Patient being seen for concern for nonrestorative sleep, multiple awakenings, past history of obstructive sleep apnea with worsening symptoms  HPI:  Diagnosed with sleep apnea in 2014 Did use CPAP in the past  History of end-stage renal disease for which he had a transplant about 2002 Transplanted failed ended up back on dialysis in 2016 Following his initial diagnosis of obstructive sleep apnea Managed to lose a lot of weight Had had a study done sometime in 2016 that was negative for significant sleep disordered breathing  Has had a second transplant May of last year 2023 Since then has gained a lot of weight about 50 pounds  Activity level is decreased lately  Multiple awakenings at night waking up from sleep feeling that he cannot breathe Get short of breath with activity Gets short of breath whenever he bends over  Usually goes to bed between 1 and 3 AM Takes him about 5 to 10 minutes to fall asleep About 5-10 awakenings Final wake up time about 10 AM  Does not feel rested whenever he wakes up in the morning Does wake up with a dry mouth Occasional headaches Memory has been foggy lately  He has a history of hypertension, congestive heart failure, end-stage renal disease  Never smoker  Always worked in IT currently disabled  He is very sleepy during the day   Outpatient Encounter Medications as of 03/01/2023  Medication Sig   albuterol (VENTOLIN HFA) 108 (90 Base) MCG/ACT inhaler Inhale into the lungs.   amLODipine (NORVASC) 5 MG tablet Take 1 tablet (5 mg total) by mouth in the morning and at bedtime.   aspirin EC 81 MG tablet Take 81 mg by mouth daily.    carvedilol (COREG) 25 MG tablet Take 25 mg by mouth 2 (two) times daily.    predniSONE  (DELTASONE) 5 MG tablet Take 5 mg by mouth daily.   senna-docusate (SENOKOT-S) 8.6-50 MG tablet Take by mouth.   sulfamethoxazole-trimethoprim (BACTRIM DS) 800-160 MG tablet Take by mouth.   tacrolimus ER (ENVARSUS XR) 1 MG TB24 Take by mouth.   calcitRIOL (ROCALTROL) 0.5 MCG capsule Take 1 capsule (0.5 mcg total) by mouth Every Tuesday,Thursday,and Saturday with dialysis. (Patient not taking: Reported on 03/01/2023)   cloNIDine (CATAPRES) 0.2 MG tablet Take 0.2 mg by mouth 2 (two) times daily. (Patient not taking: Reported on 03/01/2023)   hydrALAZINE (APRESOLINE) 100 MG tablet Take 1 tablet (100 mg total) by mouth in the morning and at bedtime. Need appointment (Patient not taking: Reported on 03/01/2023)   lactulose (CHRONULAC) 10 GM/15ML solution Take by mouth. (Patient not taking: Reported on 03/01/2023)   sevelamer carbonate (RENVELA) 800 MG tablet Take 800 mg by mouth 3 (three) times daily with meals. (Patient not taking: Reported on 03/01/2023)   simvastatin (ZOCOR) 5 MG tablet Take 5 mg by mouth Nightly. (Patient not taking: Reported on 03/01/2023)   valsartan (DIOVAN) 320 MG tablet Take 1 tablet (320 mg total) by mouth daily. (Patient not taking: Reported on 03/01/2023)   No facility-administered encounter medications on file as of 03/01/2023.    Allergies as of 03/01/2023 - Review Complete 03/01/2023  Allergen Reaction Noted   Lisinopril Swelling 11/29/2016    Past Medical History:  Diagnosis Date  Anemia    Asthma    childhood   Blood transfusion without reported diagnosis    CHF (congestive heart failure) (HCC)    Chronic diastolic heart failure (HCC) 02/28/2020   Chronic kidney disease    Dialysis Mon, Wed, Fri   Colitis        Diverticulosis 04/12/16   also seen: sigmoid and rectal erythema, path:    Dyspnea    Heart murmur    Hemorrhoids 03/2016   bleeding at 04/12/16 colonoscopy, treated with  APC laser. cauterization   Hyperlipidemia    Hypertension    OSA (obstructive  sleep apnea) 03/27/2013    Past Surgical History:  Procedure Laterality Date   A/V SHUNTOGRAM Right 04/17/2019   Procedure: A/V SHUNTOGRAM;  Surgeon: Annice Needy, MD;  Location: [facility];  Service: Cardiovascular;  Laterality: Right;   APPLICATION OF WOUND VAC Right 12/13/2016   Procedure: APPLICATION OF WOUND VAC;  Surgeon: Annice Needy, MD;  Location: [facility];  Service: Vascular;  Laterality: Right;   APPLICATION OF WOUND VAC Right 12/19/2016   Procedure: APPLICATION OF WOUND VAC;  Surgeon: Annice Needy, MD;  Location: [facility];  Service: Vascular;  Laterality: Right;   COLONOSCOPY N/A 04/12/2016   Procedure: COLONOSCOPY;  Surgeon: Napoleon Form, MD;  Location: [facility];  Service: Endoscopy;  Laterality: N/A;   DIALYSIS/PERMA CATHETER INSERTION N/A 02/26/2017   Procedure: Dialysis/Perma Catheter Insertion;  Surgeon: Annice Needy, MD;  Location: [facility];  Service: Cardiovascular;  Laterality: N/A;   DIALYSIS/PERMA CATHETER INSERTION N/A 03/26/2018   Procedure: DIALYSIS/PERMA CATHETER INSERTION;  Surgeon: Renford Dills, MD;  Location: [facility];  Service: Cardiovascular;  Laterality: N/A;   DIALYSIS/PERMA CATHETER REMOVAL N/A 01/16/2019   Procedure: DIALYSIS/PERMA CATHETER REMOVAL;  Surgeon: Annice Needy, MD;  Location: [facility];  Service: Cardiovascular;  Laterality: N/A;   EXCHANGE OF A DIALYSIS CATHETER  11/20/2016   Procedure: Exchange Of A Dialysis Catheter;  Surgeon: Annice Needy, MD;  Location: [facility]VAS[facility] CV [facility];  Service: Cardiovascular;;   fistulagrams Right    HEMATOMA EVACUATION Right 12/13/2016   Procedure: EVACUATION HEMATOMA;  Surgeon: Annice Needy, MD;  Location: [facility];  Service: Vascular;  Laterality: Right;   HEMATOMA EVACUATION Right 12/19/2016   Procedure: EVACUATIONof seroma;  Surgeon: Annice Needy, MD;  Location: [facility];  Service: Vascular;  Laterality: Right;   HOT HEMOSTASIS N/A 04/12/2016   Procedure: HOT HEMOSTASIS  (ARGON PLASMA COAGULATION/BICAP);  Surgeon: Napoleon Form, MD;  Location: [facility] ENDOSCOPY;  Service: Endoscopy;  Laterality: N/A;   KIDNEY TRANSPLANT     LIGATIONS OF HERO GRAFT Right 04/25/2018   Procedure: LIGATIONS OF HERO GRAFT ( REVISION );  Surgeon: Annice Needy, MD;  Location: [facility];  Service: Vascular;  Laterality: Right;   PERIPHERAL VASCULAR CATHETERIZATION Bilateral 10/02/2016   Procedure: Upper Extremity Venography;  Surgeon: Annice Needy, MD;  Location: [facility];  Service: Cardiovascular;  Laterality: Bilateral;   PERIPHERAL VASCULAR CATHETERIZATION Right 11/08/2016   Procedure: DIALYSIS/PERMA CATHETER REMOVAL right Jugular;  Surgeon: Annice Needy, MD;  Location: [facility];  Service: Vascular;  Laterality: Right;   PERIPHERAL VASCULAR CATHETERIZATION Left 11/08/2016   Procedure: DIALYSIS/PERMA CATHETER INSERTION left jugular with u/s guide and flouroscan;  Surgeon: Annice Needy, MD;  Location: [facility];  Service: Vascular;  Laterality: Left;   PERIPHERAL VASCULAR CATHETERIZATION N/A 11/08/2016   Procedure: IVC FILTER INSERTION;  Surgeon: Annice Needy, MD;  Location: [facility];  Service: Vascular;  Laterality: N/A;   perm a cath in Rt upper chest Right    THORACENTESIS     UPPER EXTREMITY ANGIOGRAPHY Right 11/28/2018   Procedure: UPPER EXTREMITY ANGIOGRAPHY;  Surgeon: Annice Needy, MD;  Location: [facility];  Service: Cardiovascular;  Laterality: Right;   UPPER EXTREMITY VENOGRAPHY Bilateral 11/27/2019   Procedure: UPPER EXTREMITY VENOGRAPHY;  Surgeon: Annice Needy, MD;  Location: [facility];  Service: Cardiovascular;  Laterality: Bilateral;   VASCULAR ACCESS DEVICE INSERTION Right 11/08/2016   Procedure: INSERTION OF HERO VASCULAR ACCESS DEVICE ( GRAFT );  Surgeon: Annice Needy, MD;  Location: [facility];  Service: Vascular;  Laterality: Right;    Family History  Problem Relation Age of Onset   Heart disease Mother    Cancer Mother        ovarian   CAD  Mother    Cancer Maternal Grandmother    Heart attack Maternal Grandmother    Lung cancer Maternal Grandmother    Cancer Maternal Grandfather    Heart attack Maternal Grandfather    Lung cancer Maternal Grandfather    Asthma Son    Asthma Son    Kidney failure Maternal Aunt    Diabetes Maternal Aunt     Social History   Socioeconomic History   Marital status: Married    Spouse name: Not on file   Number of children: 3   Years of education: Not on file   Highest education level: Not on file  Occupational History   Not on file  Tobacco Use   Smoking status: Never   Smokeless tobacco: Never  Vaping Use   Vaping Use: Never used  Substance and Sexual Activity   Alcohol use: No   Drug use: No   Sexual activity: Not on file  Other Topics Concern   Not on file  Social History Narrative   Live at home in private residence with wife, Psychologist, sport and exercise   Social Determinants of Health   Financial Resource Strain: Not on file  Food Insecurity: Not on file  Transportation Needs: Not on file  Physical Activity: Not on file  Stress: Not on file  Social Connections: Not on file  Intimate Partner Violence: Not on file    Review of Systems  Respiratory:  Positive for apnea and shortness of breath.   Psychiatric/Behavioral:  Positive for sleep disturbance.     Vitals:   03/01/23 1113  BP: 116/84  Pulse: 81  SpO2: 96%     Physical Exam Constitutional:      Appearance: He is obese.  HENT:     Head: Normocephalic.     Nose: Nose normal.  Eyes:     Pupils: Pupils are equal, round, and reactive to light.  Cardiovascular:     Rate and Rhythm: Normal rate and regular rhythm.     Heart sounds: No murmur heard.    No friction rub.  Pulmonary:     Effort: No respiratory distress.     Breath sounds: No stridor. No wheezing or rhonchi.  Chest:     Chest wall: No tenderness.  Musculoskeletal:     Cervical back: No rigidity or tenderness.  Neurological:     Mental Status: He is  alert.  Psychiatric:        Mood and Affect: Mood normal.       03/01/2023   11:00 AM  Results of the Epworth  flowsheet  Sitting and reading 3  Watching TV 3  Sitting, inactive in a public place (e.g. a theatre or a meeting) 2  As a passenger in a car for an hour without a break 3  Lying down to rest in the afternoon when circumstances permit 3  Sitting and talking to someone 2  Sitting quietly after a lunch without alcohol 3  In a car, while stopped for a few minutes in traffic 0  Total score 19    Data Reviewed: Previous sleep study from 2014 reviewed showing mild obstructive sleep apnea He did have central events There was titrated CPAP of 17  Assessment:  History of obstructive sleep apnea will recurrence of symptoms  End-stage renal disease s/p transplant  Significant weight gain recently  Excessive daytime sleepiness -Likely related to untreated sleep disordered breathing  Chronic fatigue  History of congestive heart failure/diastolic dysfunction -Grade 2 diastolic dysfunction on his last echocardiogram in 2022  Shortness of breath with exertion -Likely multifactorial  Deconditioning  Plan/Recommendations: Will schedule patient for pulmonary function test  Schedule patient for split-night study -He did have significant central events during his last study  Encourage graded activities as tolerated  discussed documentation about chronic obstructive pulmonary disease on patient's record with him -Never smoker, no past history of lung disease known to him  Encourage weight loss efforts  Tentative follow-up in about 3 months  Encourage patient to call with any significant concerns  Most of his symptoms likely related to his untreated sleep disordered breathing, excessive daytime sleepiness, daytime fatigue, nonrestorative sleep should improve with management of his sleep disordered breathing   Virl Diamond MD  Pulmonary and Critical  Care 03/01/2023, 11:23 AM  CC: Quentin Angst, MD

## 2023-03-01 NOTE — Addendum Note (Signed)
Addended by: Lanna Poche on: 03/01/2023 01:25 PM   Modules accepted: Orders

## 2023-03-01 NOTE — Patient Instructions (Addendum)
I will see you back in about 3 months  Schedule for pulmonary function test  Scheduled for in lab sleep study-split-night study  Graded activities as tolerated  Weight loss efforts as tolerated   Call with significant concerns ,

## 2023-03-02 DIAGNOSIS — D849 Immunodeficiency, unspecified: Secondary | ICD-10-CM | POA: Diagnosis not present

## 2023-03-02 DIAGNOSIS — I491 Atrial premature depolarization: Secondary | ICD-10-CM | POA: Diagnosis not present

## 2023-03-02 DIAGNOSIS — T82898A Other specified complication of vascular prosthetic devices, implants and grafts, initial encounter: Secondary | ICD-10-CM | POA: Diagnosis not present

## 2023-03-02 DIAGNOSIS — Z7982 Long term (current) use of aspirin: Secondary | ICD-10-CM | POA: Diagnosis not present

## 2023-03-02 DIAGNOSIS — Z969 Presence of functional implant, unspecified: Secondary | ICD-10-CM | POA: Diagnosis not present

## 2023-03-02 DIAGNOSIS — I517 Cardiomegaly: Secondary | ICD-10-CM | POA: Diagnosis not present

## 2023-03-02 DIAGNOSIS — Z79899 Other long term (current) drug therapy: Secondary | ICD-10-CM | POA: Diagnosis not present

## 2023-03-02 DIAGNOSIS — T82868A Thrombosis of vascular prosthetic devices, implants and grafts, initial encounter: Secondary | ICD-10-CM | POA: Diagnosis not present

## 2023-03-02 DIAGNOSIS — R0602 Shortness of breath: Secondary | ICD-10-CM | POA: Diagnosis not present

## 2023-03-02 DIAGNOSIS — B348 Other viral infections of unspecified site: Secondary | ICD-10-CM | POA: Diagnosis not present

## 2023-03-02 DIAGNOSIS — I10 Essential (primary) hypertension: Secondary | ICD-10-CM | POA: Diagnosis not present

## 2023-03-02 DIAGNOSIS — Z940 Kidney transplant status: Secondary | ICD-10-CM | POA: Diagnosis not present

## 2023-03-02 DIAGNOSIS — Z792 Long term (current) use of antibiotics: Secondary | ICD-10-CM | POA: Diagnosis not present

## 2023-03-02 DIAGNOSIS — J90 Pleural effusion, not elsewhere classified: Secondary | ICD-10-CM | POA: Diagnosis not present

## 2023-03-02 DIAGNOSIS — R9431 Abnormal electrocardiogram [ECG] [EKG]: Secondary | ICD-10-CM | POA: Diagnosis not present

## 2023-03-05 DIAGNOSIS — I3139 Other pericardial effusion (noninflammatory): Secondary | ICD-10-CM | POA: Diagnosis not present

## 2023-03-05 DIAGNOSIS — R6889 Other general symptoms and signs: Secondary | ICD-10-CM | POA: Diagnosis not present

## 2023-03-05 DIAGNOSIS — D709 Neutropenia, unspecified: Secondary | ICD-10-CM | POA: Diagnosis not present

## 2023-03-05 DIAGNOSIS — Y832 Surgical operation with anastomosis, bypass or graft as the cause of abnormal reaction of the patient, or of later complication, without mention of misadventure at the time of the procedure: Secondary | ICD-10-CM | POA: Diagnosis not present

## 2023-03-05 DIAGNOSIS — I10 Essential (primary) hypertension: Secondary | ICD-10-CM | POA: Diagnosis not present

## 2023-03-05 DIAGNOSIS — B348 Other viral infections of unspecified site: Secondary | ICD-10-CM | POA: Diagnosis not present

## 2023-03-05 DIAGNOSIS — I2510 Atherosclerotic heart disease of native coronary artery without angina pectoris: Secondary | ICD-10-CM | POA: Diagnosis not present

## 2023-03-05 DIAGNOSIS — J45909 Unspecified asthma, uncomplicated: Secondary | ICD-10-CM | POA: Diagnosis not present

## 2023-03-05 DIAGNOSIS — R0602 Shortness of breath: Secondary | ICD-10-CM | POA: Diagnosis not present

## 2023-03-05 DIAGNOSIS — Z940 Kidney transplant status: Secondary | ICD-10-CM | POA: Diagnosis not present

## 2023-03-05 DIAGNOSIS — T82898A Other specified complication of vascular prosthetic devices, implants and grafts, initial encounter: Secondary | ICD-10-CM | POA: Diagnosis not present

## 2023-03-05 DIAGNOSIS — D849 Immunodeficiency, unspecified: Secondary | ICD-10-CM | POA: Diagnosis not present

## 2023-03-05 DIAGNOSIS — Z79899 Other long term (current) drug therapy: Secondary | ICD-10-CM | POA: Diagnosis not present

## 2023-03-09 ENCOUNTER — Encounter: Payer: Self-pay | Admitting: *Deleted

## 2023-03-09 ENCOUNTER — Telehealth: Payer: Self-pay | Admitting: *Deleted

## 2023-03-09 NOTE — Transitions of Care (Post Inpatient/ED Visit) (Signed)
   03/09/2023  Name: Jonathon Conley MRN: [id number] DOB: [birth year]  Today's TOC FU Call Status: Today's TOC FU Call Status:: Unsuccessul Call (1st Attempt) Unsuccessful Call (1st Attempt) Date: 03/09/23  Attempted to reach the patient regarding the most recent Inpatient visit; left HIPAA compliant voice message requesting call back  Follow Up Plan: Additional outreach attempts will be made to reach the patient to complete the Transitions of Care (Post Inpatient visit) call.   Caryl Pina, RN, BSN, CCRN Alumnus RN CM Care Coordination/ Transition of Care- [facility] Care Management [phone]: direct office

## 2023-03-13 ENCOUNTER — Telehealth: Payer: Self-pay | Admitting: *Deleted

## 2023-03-13 ENCOUNTER — Encounter: Payer: Self-pay | Admitting: *Deleted

## 2023-03-13 NOTE — Transitions of Care (Post Inpatient/ED Visit) (Signed)
   03/13/2023  Name: CORLEY SALOMONE MRN: [id number] DOB: 10/08/1966  Today's TOC FU Call Status: Today's TOC FU Call Status:: Unsuccessful Call (2nd Attempt) Unsuccessful Call (2nd Attempt) Date: 03/13/23  Attempted to reach the patient regarding the most recent Inpatient visit; left HIPAA compliant voice message requesting call back  Follow Up Plan: Additional outreach attempts will be made to reach the patient to complete the Transitions of Care (Post Inpatient visit) call.   Caryl Pina, RN, BSN, CCRN Alumnus RN CM Care Coordination/ Transition of Care- [facility] Care Management [phone]: direct office

## 2023-03-14 ENCOUNTER — Encounter: Payer: Self-pay | Admitting: *Deleted

## 2023-03-14 ENCOUNTER — Telehealth: Payer: Self-pay | Admitting: *Deleted

## 2023-03-14 NOTE — Transitions of Care (Post Inpatient/ED Visit) (Signed)
   03/14/2023  Name: Jonathon Conley MRN: [id number] DOB: 1966-03-20  Today's TOC FU Call Status: Today's TOC FU Call Status:: Unsuccessful Call (3rd Attempt) Unsuccessful Call (3rd Attempt) Date: 03/14/23  Attempted to reach the patient regarding the most recent Inpatient visit; left HIPAA compliant voice message requesting call back  Follow Up Plan: No further outreach attempts will be made at this time. We have been unable to contact the patient.  Caryl Pina, RN, BSN, CCRN Alumnus RN CM Care Coordination/ Transition of Care- [facility] Care Management [phone]: direct office

## 2023-03-19 DIAGNOSIS — Z940 Kidney transplant status: Secondary | ICD-10-CM | POA: Diagnosis not present

## 2023-03-22 DIAGNOSIS — B348 Other viral infections of unspecified site: Secondary | ICD-10-CM | POA: Diagnosis not present

## 2023-03-22 DIAGNOSIS — R768 Other specified abnormal immunological findings in serum: Secondary | ICD-10-CM | POA: Diagnosis not present

## 2023-03-22 DIAGNOSIS — Z940 Kidney transplant status: Secondary | ICD-10-CM | POA: Diagnosis not present

## 2023-03-23 DIAGNOSIS — R300 Dysuria: Secondary | ICD-10-CM | POA: Diagnosis not present

## 2023-03-23 DIAGNOSIS — Z940 Kidney transplant status: Secondary | ICD-10-CM | POA: Diagnosis not present

## 2023-03-29 DIAGNOSIS — Z940 Kidney transplant status: Secondary | ICD-10-CM | POA: Diagnosis not present

## 2023-04-05 ENCOUNTER — Ambulatory Visit (HOSPITAL_BASED_OUTPATIENT_CLINIC_OR_DEPARTMENT_OTHER): Payer: Medicare HMO | Admitting: Pulmonary Disease

## 2023-04-11 ENCOUNTER — Ambulatory Visit (HOSPITAL_BASED_OUTPATIENT_CLINIC_OR_DEPARTMENT_OTHER): Payer: Medicare HMO | Attending: Pulmonary Disease | Admitting: Pulmonary Disease

## 2023-04-11 DIAGNOSIS — G4733 Obstructive sleep apnea (adult) (pediatric): Secondary | ICD-10-CM | POA: Insufficient documentation

## 2023-04-20 DIAGNOSIS — D849 Immunodeficiency, unspecified: Secondary | ICD-10-CM | POA: Diagnosis not present

## 2023-04-20 DIAGNOSIS — Z940 Kidney transplant status: Secondary | ICD-10-CM | POA: Diagnosis not present

## 2023-04-20 DIAGNOSIS — R768 Other specified abnormal immunological findings in serum: Secondary | ICD-10-CM | POA: Diagnosis not present

## 2023-04-22 ENCOUNTER — Telehealth: Payer: Self-pay | Admitting: Pulmonary Disease

## 2023-04-22 DIAGNOSIS — G4733 Obstructive sleep apnea (adult) (pediatric): Secondary | ICD-10-CM

## 2023-04-22 NOTE — Procedures (Signed)
POLYSOMNOGRAPHY  Last, First: Jonathon, Conley MRN: [id number] Gender: Male Age (years): 56 Weight (lbs): 203 DOB: [birth year] BMI: 29 Primary Care: No PCP Epworth Score: 17 Referring: Tomma Lightning MD Technician: Ulyess Mort Interpreting: Tomma Lightning MD Study Type: NPSG Ordered Study Type: Split Night CPAP Study date: 04/11/2023 Location: [place] CLINICAL INFORMATION Jonathon Conley is a 58 year old Male and was referred to the sleep center for evaluation of G47.33 OSA: Adult and Pediatric (327.23). Indications include Congestive Heart Failure, Fatigue, Hypertension, Snoring.   Most recent polysomnogram dated 03/30/2013 revealed an AHI of 14.6/h. Most recent titration study dated 03/30/2013 was optimal at 21.17cm H2O. MEDICATIONS Patient self administered medications include: N/A. Medications administered during study include No sleep medicine administered.  SLEEP STUDY TECHNIQUE A multi-channel overnight Polysomnography study was performed. The channels recorded and monitored were central and occipital EEG, electrooculogram (EOG), submentalis EMG (chin), nasal and oral airflow, thoracic and abdominal wall motion, anterior tibialis EMG, snore microphone, electrocardiogram, and a pulse oximetry. TECHNICIAN COMMENTS Comments added by Technician: PATIENT WAS ORDERED AS A SPLIT NIGHT STUDY. Comments added by Scorer: N/A SLEEP ARCHITECTURE The study was initiated at 11:17:45 PM and terminated at 5:17:13 AM. The total recorded time was 359.5 minutes. EEG confirmed total sleep time was 247.5 minutes yielding a sleep efficiency of 68.8%. Sleep onset after lights out was 26.2 minutes with a REM latency of 201.0 minutes. The patient spent 28.9% of the night in stage N1 sleep, 61.2% in stage N2 sleep, 0.0% in stage N3 and 9.9% in REM. Wake after sleep onset (WASO) was 85.7 minutes. The Arousal Index was 69.3/hour. RESPIRATORY PARAMETERS There were a total of 278 respiratory disturbances  out of which 129 were apneas ( 56 obstructive, 70 mixed, 3 central) and 149 hypopneas. The apnea/hypopnea index (AHI) was 67.4 events/hour. The central sleep apnea index was 0.7 events/hour. The REM AHI was 56.3 events/hour and NREM AHI was 68.6 events/hour. The supine AHI was 69.0 events/hour and the non supine AHI was 58.4 supine during 84.65% of sleep. Respiratory disturbances were associated with oxygen desaturation down to a nadir of 66.0% during sleep. The mean oxygen saturation during the study was 93.0%.   LEG MOVEMENT DATA The total leg movements were 0 with a resulting leg movement index of 0.0/hr .Associated arousal with leg movement index was 0.0/hr.  CARDIAC DATA The underlying cardiac rhythm was most consistent with sinus rhythm. Mean heart rate during sleep was 72.7 bpm. Additional rhythm abnormalities include None.  IMPRESSIONS - Severe Obstructive Sleep apnea(OSA) - EKG showed no cardiac abnormalities. - Severe Oxygen Desaturation - The patient snored with loud snoring volume. - No significant periodic leg movements(PLMs) during sleep. However, no significant associated arousals.  DIAGNOSIS - Severe Obstructive Sleep Apnea (G47.33) - Nocturnal Hypoxemia (G47.36)  RECOMMENDATIONS - Therapeutic CPAP titration to determine optimal pressure required to alleviate sleep disordered breathing. - Positional therapy avoiding supine position during sleep. - Avoid alcohol, sedatives and other CNS depressants that may worsen sleep apnea and disrupt normal sleep architecture. - Sleep hygiene should be reviewed to assess factors that may improve sleep quality. - Weight management and regular exercise should be initiated or continued.  [Electronically signed] 04/22/2023 09:29 AM  Virl Diamond MD NPI: [id number]

## 2023-04-22 NOTE — Telephone Encounter (Signed)
Call patient  Sleep study result  Date of study: 04/11/2023  Impression: Severe obstructive sleep apnea Severe oxygen desaturations  Recommendation:  Recommend in lab titration study for optimal treatment of severe obstructive sleep apnea  May require oxygen supplementation with CPAP therapy  Close clinical follow-up of symptoms

## 2023-05-04 NOTE — Telephone Encounter (Signed)
PT ret Bonnie's call. Please try again.

## 2023-05-04 NOTE — Telephone Encounter (Signed)
ATC x1 Left message for patient to call our office back regarding sleep study result's will also send message VIA myschart.

## 2023-05-09 NOTE — Telephone Encounter (Signed)
Spoke with patient regarding sleep study result's   Sleep study result   Date of study: 04/11/2023   Impression: Severe obstructive sleep apnea Severe oxygen desaturations   Recommendation:   Recommend in lab titration study for optimal treatment of severe obstructive sleep apnea  .May require oxygen supplementation with CPAP therapy     Close clinical follow-up of symptoms    order has been placed for inlab titration study. Patient's voice was understanding. Nothing else further needed at this time .

## 2023-05-25 DIAGNOSIS — I10 Essential (primary) hypertension: Secondary | ICD-10-CM | POA: Diagnosis not present

## 2023-05-25 DIAGNOSIS — Z792 Long term (current) use of antibiotics: Secondary | ICD-10-CM | POA: Diagnosis not present

## 2023-05-25 DIAGNOSIS — D849 Immunodeficiency, unspecified: Secondary | ICD-10-CM | POA: Diagnosis not present

## 2023-05-25 DIAGNOSIS — D84821 Immunodeficiency due to drugs: Secondary | ICD-10-CM | POA: Diagnosis not present

## 2023-05-25 DIAGNOSIS — R768 Other specified abnormal immunological findings in serum: Secondary | ICD-10-CM | POA: Diagnosis not present

## 2023-05-25 DIAGNOSIS — I509 Heart failure, unspecified: Secondary | ICD-10-CM | POA: Diagnosis not present

## 2023-05-25 DIAGNOSIS — R635 Abnormal weight gain: Secondary | ICD-10-CM | POA: Diagnosis not present

## 2023-05-25 DIAGNOSIS — Z6830 Body mass index (BMI) 30.0-30.9, adult: Secondary | ICD-10-CM | POA: Diagnosis not present

## 2023-05-25 DIAGNOSIS — Z4822 Encounter for aftercare following kidney transplant: Secondary | ICD-10-CM | POA: Diagnosis not present

## 2023-05-25 DIAGNOSIS — B348 Other viral infections of unspecified site: Secondary | ICD-10-CM | POA: Diagnosis not present

## 2023-05-25 DIAGNOSIS — D638 Anemia in other chronic diseases classified elsewhere: Secondary | ICD-10-CM | POA: Diagnosis not present

## 2023-05-25 DIAGNOSIS — Z940 Kidney transplant status: Secondary | ICD-10-CM | POA: Diagnosis not present

## 2023-05-25 DIAGNOSIS — T8612 Kidney transplant failure: Secondary | ICD-10-CM | POA: Diagnosis not present

## 2023-05-25 DIAGNOSIS — I120 Hypertensive chronic kidney disease with stage 5 chronic kidney disease or end stage renal disease: Secondary | ICD-10-CM | POA: Diagnosis not present

## 2023-05-25 DIAGNOSIS — I110 Hypertensive heart disease with heart failure: Secondary | ICD-10-CM | POA: Diagnosis not present

## 2023-06-04 ENCOUNTER — Ambulatory Visit (INDEPENDENT_AMBULATORY_CARE_PROVIDER_SITE_OTHER): Payer: Medicare HMO | Admitting: Pulmonary Disease

## 2023-06-04 DIAGNOSIS — R0602 Shortness of breath: Secondary | ICD-10-CM

## 2023-06-04 LAB — PULMONARY FUNCTION TEST
DL/VA % pred: 83 %
DL/VA: 3.59 ml/min/mmHg/L
DLCO cor % pred: 55 %
DLCO cor: 15.74 ml/min/mmHg
DLCO unc % pred: 55 %
DLCO unc: 15.74 ml/min/mmHg
FEF 25-75 Post: 1.25 L/s
FEF 25-75 Pre: 1.09 L/s
FEF2575-%Change-Post: 14 %
FEF2575-%Pred-Post: 39 %
FEF2575-%Pred-Pre: 34 %
FEV1-%Change-Post: 2 %
FEV1-%Pred-Post: 47 %
FEV1-%Pred-Pre: 46 %
FEV1-Post: 1.76 L
FEV1-Pre: 1.72 L
FEV1FVC-%Change-Post: -1 %
FEV1FVC-%Pred-Pre: 92 %
FEV6-%Change-Post: 4 %
FEV6-%Pred-Post: 53 %
FEV6-%Pred-Pre: 51 %
FEV6-Post: 2.5 L
FEV6-Pre: 2.4 L
FEV6FVC-%Change-Post: 0 %
FEV6FVC-%Pred-Post: 103 %
FEV6FVC-%Pred-Pre: 102 %
FVC-%Change-Post: 3 %
FVC-%Pred-Post: 52 %
FVC-%Pred-Pre: 50 %
FVC-Post: 2.53 L
FVC-Pre: 2.44 L
Post FEV1/FVC ratio: 70 %
Post FEV6/FVC ratio: 99 %
Pre FEV1/FVC ratio: 70 %
Pre FEV6/FVC Ratio: 99 %
RV % pred: 112 %
RV: 2.46 L
TLC % pred: 71 %
TLC: 5.02 L

## 2023-06-04 NOTE — Patient Instructions (Signed)
Full PFT performed today. °

## 2023-06-04 NOTE — Progress Notes (Signed)
Full PFT performed today. °

## 2023-06-07 ENCOUNTER — Encounter (HOSPITAL_BASED_OUTPATIENT_CLINIC_OR_DEPARTMENT_OTHER): Payer: Medicare HMO | Admitting: Pulmonary Disease

## 2023-06-08 ENCOUNTER — Ambulatory Visit (HOSPITAL_BASED_OUTPATIENT_CLINIC_OR_DEPARTMENT_OTHER): Payer: Medicare HMO | Attending: Pulmonary Disease | Admitting: Pulmonary Disease

## 2023-06-08 DIAGNOSIS — G4733 Obstructive sleep apnea (adult) (pediatric): Secondary | ICD-10-CM | POA: Diagnosis not present

## 2023-06-12 DIAGNOSIS — Z940 Kidney transplant status: Secondary | ICD-10-CM | POA: Diagnosis not present

## 2023-06-14 ENCOUNTER — Telehealth: Payer: Self-pay | Admitting: Pulmonary Disease

## 2023-06-14 DIAGNOSIS — G4733 Obstructive sleep apnea (adult) (pediatric): Secondary | ICD-10-CM

## 2023-06-14 NOTE — Telephone Encounter (Signed)
Call patient  Sleep study result  Date of study: 06/08/2023  Impression: Severe obstructive sleep apnea with moderate oxygen desaturations  Recommendation: DME referral  Recommend BiPAP therapy for moderate obstructive sleep apnea  Trial of BiPAP therapy on 19/15 cm H2O, Flex of 1 with a Medium size Fisher&Paykel Full Face Simplus mask and heated humidification.  Encourage weight loss measures  Follow-up in the office 4 to 6 weeks following initiation of treatment

## 2023-06-14 NOTE — Procedures (Signed)
POLYSOMNOGRAPHY  Last, First: Orey, Haider MRN: [id number] Gender: Male Age (years): 57 Weight (lbs): 203 DOB: 1966-07-20 BMI: 29 Primary Care: No PCP Epworth Score: 17 Referring: Tomma Lightning MD Technician: Cherylann Parr Interpreting: Tomma Lightning MD Study Type: BiPAP Ordered Study Type: CPAP Study date: 06/08/2023 Location: [place] CLINICAL INFORMATION Jonathon Conley is a 57 year old Male and was referred to the sleep center for evaluation of G47.33 OSA: Adult and Pediatric (327.23). Indications include N/A.   Most recent polysomnogram dated 04/11/2023 revealed an AHI of 67.4/h and RDI of 68.8/h. MEDICATIONS Patient self administered medications include: N/A. Medications administered during study include No sleep medicine administered.  SLEEP STUDY TECHNIQUE The patient underwent an attended overnight polysomnography titration to assess the effects of BIPAP therapy. The following variables were monitored: EEG(C4-A1, C3-A2, O1-A2, O2-A1), EOG, submental and leg EMG, ECG, oxyhemoglobin saturation by pulse oximetry, thoracic and abdominal respiratory effort belts, nasal/oral airflow by pressure sensor, body position sensor and snoring sensor. BIPAP pressure was titrated to eliminate apneas, hypopneas and oxygen desaturation. Hypopneas were scored per AASM definition IB (4% desaturation)  TECHNICIAN COMMENTS Comments added by Technician: Patient had difficulty initiating sleep. Patient was restless all through the night. Comments added by Scorer: N/A SLEEP ARCHITECTURE The study was initiated at 10:02:34 PM and terminated at 4:09:34 AM. Total recorded time was 367 minutes. EEG confirmed total sleep time was 277.9 minutes yielding a sleep efficiency of 75.7%. Sleep onset after lights out was 28.6 minutes with a REM latency of 64.5 minutes. The patient spent 1.3% of the night in stage N1 sleep, 74.6% in stage N2 sleep, 0.0% in stage N3 and 24.1% in REM. The Arousal Index was  1.5/hour. RESPIRATORY PARAMETERS The overall AHI was 38.4 per hour, and the RDI was 38.4 events/hour with a central apnea index of 5per hour. The most appropriate setting of BiPAP was 19/15 cm H2O. At this setting, the sleep efficiency was 100 % and the patient was supine for 100%. The AHI was 2.4 events per hour, and the RDI was 2.4 events/hour (with 5 central events) and the arousal index was 0 per hour.The oxygen nadir was 97.0% during sleep.  LEG MOVEMENT DATA The total leg movements were 0 with a resulting leg movement index of 0.0. Associated arousal with leg movement index was 0.0. CARDIAC DATA The underlying cardiac rhythm was most consistent with sinus rhythm. Mean heart rate during sleep was 83.2 bpm. Additional rhythm abnormalities include None.  IMPRESSIONS - Severe Obstructive Sleep apnea(OSA) Optimal pressure attained. - EKG showed no cardiac abnormalities. - Moderate Oxygen Desaturation - No snoring was audible during this study. - No significant periodic leg movements(PLMs) during sleep. However, no significant associated arousals.  DIAGNOSIS - Severe obstructive Sleep Apnea (G47.33)  RECOMMENDATIONS - Trial of BiPAP therapy on 19/15 cm H2O, Flex of 1 with a Medium size Fisher&Paykel Full Face Simplus mask and heated humidification. - Avoid alcohol, sedatives and other CNS depressants that may worsen sleep apnea and disrupt normal sleep architecture. - Sleep hygiene should be reviewed to assess factors that may improve sleep quality. - Weight management and regular exercise should be initiated or continued. - Return to Sleep Center for re-evaluation after 4 weeks of therapy  [Electronically signed] 06/14/2023 08:56 AM  Virl Diamond MD NPI: [id number]

## 2023-07-04 ENCOUNTER — Ambulatory Visit (INDEPENDENT_AMBULATORY_CARE_PROVIDER_SITE_OTHER): Payer: Medicare HMO | Admitting: Pulmonary Disease

## 2023-07-04 ENCOUNTER — Encounter: Payer: Self-pay | Admitting: Pulmonary Disease

## 2023-07-04 VITALS — BP 138/98 | HR 90 | Temp 97.7°F | Ht 70.0 in | Wt 211.4 lb

## 2023-07-04 DIAGNOSIS — G4733 Obstructive sleep apnea (adult) (pediatric): Secondary | ICD-10-CM

## 2023-07-04 NOTE — Telephone Encounter (Signed)
Patient was seen on 07/04/23 and BIPAP has been ordered.Nothing else further needed.

## 2023-07-04 NOTE — Patient Instructions (Addendum)
We will get an order to the medical supply company to start you on your BiPAP  Call us with significant concerns  Follow-up appointment about 2 months  Call us with significant concerns  Continue graded activities as tolerated  It takes a little bit of time to get used to using the BiPAP on a regular basis

## 2023-07-04 NOTE — Progress Notes (Signed)
Jonathon Conley    [id number]    08/07/1966  Primary Care Physician:Jegede, Phylliss Blakes, MD  Referring Physician: Quentin Angst, MD [address] [place],  [place] [address]  Chief complaint:   Patient being seen for concern for nonrestorative sleep, multiple awakenings, past history of obstructive sleep apnea with worsening symptoms  HPI:  Diagnosed with sleep apnea in 2014 Did use CPAP in the past  Had a repeat study that was negative recently and following this with worsening symptoms was studied again  Had a repeat sleep study performed, titrated to BiPAP, yet to start new BiPAP therapy  History of end-stage renal disease for which he had a transplant about 2002 Transplanted failed ended up back on dialysis in 2016 Following his initial diagnosis of obstructive sleep apnea Managed to lose a lot of weight  Has had a second transplant May of last year 2023 Since then has gained a lot of weight about 50 pounds  Activity level is decreased lately  Multiple awakenings at night waking up from sleep feeling that he cannot breathe Get short of breath with activity Gets short of breath whenever he bends over  Usually goes to bed between 1 and 3 AM Takes him about 5 to 10 minutes to fall asleep About 5-10 awakenings Final wake up time about 10 AM  Does not feel rested whenever he wakes up in the morning Does wake up with a dry mouth Occasional headaches Memory has been foggy lately  He has a history of hypertension, congestive heart failure, end-stage renal disease  Never smoker  Always worked in IT currently disabled  He is very sleepy during the day   Outpatient Encounter Medications as of 07/04/2023  Medication Sig   albuterol (VENTOLIN HFA) 108 (90 Base) MCG/ACT inhaler Inhale into the lungs.   amLODipine (NORVASC) 5 MG tablet Take 1 tablet (5 mg total) by mouth in the morning and at bedtime.   aspirin EC 81 MG tablet Take 81 mg by mouth  daily.    carvedilol (COREG) 25 MG tablet Take 25 mg by mouth 2 (two) times daily.    predniSONE (DELTASONE) 5 MG tablet Take 5 mg by mouth daily.   senna-docusate (SENOKOT-S) 8.6-50 MG tablet Take by mouth.   tacrolimus ER (ENVARSUS XR) 1 MG TB24 Take by mouth.   [DISCONTINUED] calcitRIOL (ROCALTROL) 0.5 MCG capsule Take 1 capsule (0.5 mcg total) by mouth Every Tuesday,Thursday,and Saturday with dialysis. (Patient not taking: Reported on 03/01/2023)   [DISCONTINUED] cloNIDine (CATAPRES) 0.2 MG tablet Take 0.2 mg by mouth 2 (two) times daily. (Patient not taking: Reported on 03/01/2023)   [DISCONTINUED] hydrALAZINE (APRESOLINE) 100 MG tablet Take 1 tablet (100 mg total) by mouth in the morning and at bedtime. Need appointment (Patient not taking: Reported on 03/01/2023)   [DISCONTINUED] lactulose (CHRONULAC) 10 GM/15ML solution Take by mouth. (Patient not taking: Reported on 03/01/2023)   [DISCONTINUED] sevelamer carbonate (RENVELA) 800 MG tablet Take 800 mg by mouth 3 (three) times daily with meals. (Patient not taking: Reported on 03/01/2023)   [DISCONTINUED] simvastatin (ZOCOR) 5 MG tablet Take 5 mg by mouth Nightly. (Patient not taking: Reported on 03/01/2023)   [DISCONTINUED] sulfamethoxazole-trimethoprim (BACTRIM DS) 800-160 MG tablet Take by mouth.   [DISCONTINUED] valsartan (DIOVAN) 320 MG tablet Take 1 tablet (320 mg total) by mouth daily. (Patient not taking: Reported on 03/01/2023)   No facility-administered encounter medications on  file as of 07/04/2023.    Allergies as of 07/04/2023 - Review Complete 07/04/2023  Allergen Reaction Noted   Lisinopril Swelling 11/29/2016    Past Medical History:  Diagnosis Date   Anemia    Asthma    childhood   Blood transfusion without reported diagnosis    CHF (congestive heart failure) (HCC)    Chronic diastolic heart failure (HCC) 02/28/2020   Chronic kidney disease    Dialysis Mon, Wed, Fri   Colitis        Diverticulosis 04/12/16   also seen:  sigmoid and rectal erythema, path:    Dyspnea    Heart murmur    Hemorrhoids 03/2016   bleeding at 04/12/16 colonoscopy, treated with  APC laser. cauterization   Hyperlipidemia    Hypertension    OSA (obstructive sleep apnea) 03/27/2013    Past Surgical History:  Procedure Laterality Date   A/V SHUNTOGRAM Right 04/17/2019   Procedure: A/V SHUNTOGRAM;  Surgeon: Annice Needy, MD;  Location: [facility]ASIVE [facility];  Service: Cardiovascular;  Laterality: Right;   APPLICATION OF WOUND VAC Right 12/13/2016   Procedure: APPLICATION OF WOUND VAC;  Surgeon: Annice Needy, MD;  Location: [facility];  Service: Vascular;  Laterality: Right;   APPLICATION OF WOUND VAC Right 12/19/2016   Procedure: APPLICATION OF WOUND VAC;  Surgeon: Annice Needy, MD;  Location: [facility];  Service: Vascular;  Laterality: Right;   COLONOSCOPY N/A 04/12/2016   Procedure: COLONOSCOPY;  Surgeon: Napoleon Form, MD;  Location: [facility];  Service: Endoscopy;  Laterality: N/A;   DIALYSIS/PERMA CATHETER INSERTION N/A 02/26/2017   Procedure: Dialysis/Perma Catheter Insertion;  Surgeon: Annice Needy, MD;  Location: [facility];  Service: Cardiovascular;  Laterality: N/A;   DIALYSIS/PERMA CATHETER INSERTION N/A 03/26/2018   Procedure: DIALYSIS/PERMA CATHETER INSERTION;  Surgeon: Renford Dills, MD;  Location: [facility];  Service: Cardiovascular;  Laterality: N/A;   DIALYSIS/PERMA CATHETER REMOVAL N/A 01/16/2019   Procedure: DIALYSIS/PERMA CATHETER REMOVAL;  Surgeon: Annice Needy, MD;  Location: [facility];  Service: Cardiovascular;  Laterality: N/A;   EXCHANGE OF A DIALYSIS CATHETER  11/20/2016   Procedure: Exchange Of A Dialysis Catheter;  Surgeon: Annice Needy, MD;  Location: [facility] INVASIVE CV LAB;  Service: Cardiovascular;;   fistulagrams Right    HEMATOMA EVACUATION Right 12/13/2016   Procedure: EVACUATION HEMATOMA;  Surgeon: Annice Needy, MD;  Location: [facility];  Service: Vascular;  Laterality: Right;    HEMATOMA EVACUATION Right 12/19/2016   Procedure: EVACUATIONof seroma;  Surgeon: Annice Needy, MD;  Location: [facility];  Service: Vascular;  Laterality: Right;   HOT HEMOSTASIS N/A 04/12/2016   Procedure: HOT HEMOSTASIS (ARGON PLASMA COAGULATION/BICAP);  Surgeon: Napoleon Form, MD;  Location: [facility] ENDOSCOPY;  Service: Endoscopy;  Laterality: N/A;   KIDNEY TRANSPLANT     LIGATIONS OF HERO GRAFT Right 04/25/2018   Procedure: LIGATIONS OF HERO GRAFT ( REVISION );  Surgeon: Annice Needy, MD;  Location: [facility];  Service: Vascular;  Laterality: Right;   PERIPHERAL VASCULAR CATHETERIZATION Bilateral 10/02/2016   Procedure: Upper Extremity Venography;  Surgeon: Annice Needy, MD;  Location: [facility];  Service: Cardiovascular;  Laterality: Bilateral;   PERIPHERAL VASCULAR CATHETERIZATION Right 11/08/2016   Procedure: DIALYSIS/PERMA CATHETER REMOVAL right Jugular;  Surgeon: Annice Needy, MD;  Location: [facility];  Service: Vascular;  Laterality: Right;   PERIPHERAL VASCULAR CATHETERIZATION Left 11/08/2016   Procedure: DIALYSIS/PERMA CATHETER INSERTION left  jugular with u/s guide and flouroscan;  Surgeon: Annice Needy, MD;  Location: [facility];  Service: Vascular;  Laterality: Left;   PERIPHERAL VASCULAR CATHETERIZATION N/A 11/08/2016   Procedure: IVC FILTER INSERTION;  Surgeon: Annice Needy, MD;  Location: [facility];  Service: Vascular;  Laterality: N/A;   perm a cath in Rt upper chest Right    THORACENTESIS     UPPER EXTREMITY ANGIOGRAPHY Right 11/28/2018   Procedure: UPPER EXTREMITY ANGIOGRAPHY;  Surgeon: Annice Needy, MD;  Location: [facility];  Service: Cardiovascular;  Laterality: Right;   UPPER EXTREMITY VENOGRAPHY Bilateral 11/27/2019   Procedure: UPPER EXTREMITY VENOGRAPHY;  Surgeon: Annice Needy, MD;  Location: [facility];  Service: Cardiovascular;  Laterality: Bilateral;   VASCULAR ACCESS DEVICE INSERTION Right 11/08/2016   Procedure: INSERTION OF HERO VASCULAR ACCESS DEVICE (  GRAFT );  Surgeon: Annice Needy, MD;  Location: [facility];  Service: Vascular;  Laterality: Right;    Family History  Problem Relation Age of Onset   Heart disease Mother    Cancer Mother        ovarian   CAD Mother    Cancer Maternal Grandmother    Heart attack Maternal Grandmother    Lung cancer Maternal Grandmother    Cancer Maternal Grandfather    Heart attack Maternal Grandfather    Lung cancer Maternal Grandfather    Asthma Son    Asthma Son    Kidney failure Maternal Aunt    Diabetes Maternal Aunt     Social History   Socioeconomic History   Marital status: Married    Spouse name: Not on file   Number of children: 3   Years of education: Not on file   Highest education level: Not on file  Occupational History   Not on file  Tobacco Use   Smoking status: Never   Smokeless tobacco: Never  Vaping Use   Vaping status: Never Used  Substance and Sexual Activity   Alcohol use: No   Drug use: No   Sexual activity: Not on file  Other Topics Concern   Not on file  Social History Narrative   Live at home in private residence with wife, Psychologist, sport and exercise   Social Determinants of Health   Financial Resource Strain: Low Risk  (03/08/2023)   Received from [facility], [facility]   Overall Financial Resource Strain (CARDIA)    Difficulty of Paying Living Expenses: Not hard at all  Food Insecurity: No Food Insecurity (03/08/2023)   Received from [facility], [facility]   Hunger Vital Sign    Worried About Running Out of Food in the Last Year: Never true    Ran Out of Food in the Last Year: Never true  Transportation Needs: Unmet Transportation Needs (03/08/2023)   Received from [facility], [facility]   [facility] - Transportation    In the past 12 months, has lack of transportation kept you from medical appointments or from getting medications?: Yes    Lack of  Transportation (Non-Medical): No  Physical Activity: Not on file  Stress: Not on file  Social Connections: Not on file  Intimate Partner Violence: Not on file    Review of Systems  Respiratory:  Positive for apnea and shortness of breath.   Psychiatric/Behavioral:  Positive for sleep disturbance.     Vitals:   07/04/23 0838 07/04/23 0843  BP: (!) 142/98 Marland Kitchen)  138/98  Pulse: 90   Temp: 97.7 F (36.5 C)   SpO2: 100%      Physical Exam Constitutional:      Appearance: He is obese.  HENT:     Head: Normocephalic.     Nose: Nose normal.  Eyes:     Pupils: Pupils are equal, round, and reactive to light.  Cardiovascular:     Rate and Rhythm: Normal rate and regular rhythm.     Heart sounds: No murmur heard.    No friction rub.  Pulmonary:     Effort: No respiratory distress.     Breath sounds: No stridor. No wheezing or rhonchi.  Chest:     Chest wall: No tenderness.  Musculoskeletal:     Cervical back: No rigidity or tenderness.  Neurological:     Mental Status: He is alert.  Psychiatric:        Mood and Affect: Mood normal.      03/01/2023   11:00 AM  Results of the Epworth flowsheet  Sitting and reading 3  Watching TV 3  Sitting, inactive in a public place (e.g. a theatre or a meeting) 2  As a passenger in a car for an hour without a break 3  Lying down to rest in the afternoon when circumstances permit 3  Sitting and talking to someone 2  Sitting quietly after a lunch without alcohol 3  In a car, while stopped for a few minutes in traffic 0  Total score 19    Data Reviewed: Previous sleep study from 2014 reviewed showing mild obstructive sleep apnea He did have central events There was titrated CPAP of 17  Recent sleep study 06/08/2023 showing severe obstructive sleep apnea Titrated to BiPAP 19/15  Assessment:  Severe obstructive sleep apnea -Gets to resume BiPAP therapy  Excessive daytime sleepiness likely related to untreated sleep disordered  breathing  End-stage renal disease s/p transplant  Significant weight gain recently  Chronic fatigue  History of congestive heart failure/diastolic dysfunction -Grade 2 diastolic dysfunction on his last echocardiogram in 2022  Shortness of breath with exertion -Likely multifactorial  Deconditioning  Plan/Recommendations: Contact DME company for initiation of BiPAP  BiPAP therapy 19/15 with Biflex of 1 with medium size [employer] fullface Simplus mask and heated humidification  Encouraged to continue working on weight loss efforts  Graded activities as tolerated  discussed documentation about chronic obstructive pulmonary disease on patient's record with him -Never smoker, no past history of lung disease known to him  Encourage weight loss efforts  Tentative follow-up in about 2 months  Encourage patient to call with any significant concerns  Symptoms should improve with BiPAP treatment Virl Diamond MD [facility] 07/04/2023, 8:46 AM  CC: Quentin Angst, MD

## 2023-09-06 ENCOUNTER — Encounter: Payer: Self-pay | Admitting: Pulmonary Disease

## 2023-09-06 ENCOUNTER — Ambulatory Visit (INDEPENDENT_AMBULATORY_CARE_PROVIDER_SITE_OTHER): Payer: Medicare HMO | Admitting: Pulmonary Disease

## 2023-09-06 VITALS — BP 128/84 | HR 81 | Ht 71.0 in | Wt 215.4 lb

## 2023-09-06 DIAGNOSIS — G4733 Obstructive sleep apnea (adult) (pediatric): Secondary | ICD-10-CM | POA: Diagnosis not present

## 2023-09-06 NOTE — Patient Instructions (Signed)
DME referral for CPAP supplies  Need a new mask-fullface mask  I will see you in about 3 months  Continue using CPAP on a nightly basis  Call us with any significant concerns

## 2023-09-09 NOTE — Progress Notes (Signed)
Jonathon Conley    [id number]    [birth year]  Primary Care Physician:Jegede, Phylliss Blakes, MD  Referring Physician: Quentin Angst, MD [address] [place],  [place] [address]  Chief complaint:   Patient being seen for concern for nonrestorative sleep, multiple awakenings, past history of obstructive sleep apnea with worsening symptoms  HPI:  Diagnosed with sleep apnea in 2014 Did use CPAP in the past  Had a titration to BiPAP  Has been doing well since resuming BiPAP therapy -Does have mask issues with mask leaks  Energy levels better  History of renal transplant, last transplant was May 2023  Activity level is better  History significant for Multiple awakenings at night waking up from sleep feeling that he cannot breathe Get short of breath with activity Gets short of breath whenever he bends over  Usually goes to bed between 1 and 3 AM Takes him about 5 to 10 minutes to fall asleep About 5-10 awakenings Final wake up time about 10 AM  Does not feel rested whenever he wakes up in the morning Does wake up with a dry mouth Occasional headaches Memory has been foggy lately  He has a history of hypertension, congestive heart failure, end-stage renal disease  Never smoker  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   amLODipine (NORVASC) 5 MG tablet Take 1 tablet (5 mg total) by mouth in the morning and at bedtime.   aspirin EC 81 MG tablet Take 81 mg by mouth daily.    carvedilol (COREG) 25 MG tablet Take 25 mg by mouth 2 (two) times daily.    Cholecalciferol (D 1000) 25 MCG (1000 UT) capsule Take 1,000 Units by mouth daily.   hydrALAZINE (APRESOLINE) 50 MG tablet Take 50 mg by mouth 3 (three) times daily.   losartan (COZAAR) 100 MG tablet Take 100 mg by mouth  daily.   predniSONE (DELTASONE) 5 MG tablet Take 5 mg by mouth daily.   senna-docusate (SENOKOT-S) 8.6-50 MG tablet Take by mouth.   tacrolimus ER (ENVARSUS XR) 1 MG TB24 Take by mouth.   No facility-administered encounter medications on file as of 09/06/2023.    Allergies as of 09/06/2023 - Review Complete 09/06/2023  Allergen Reaction Noted   Lisinopril Swelling 11/29/2016    Past Medical History:  Diagnosis Date   Anemia    Asthma    childhood   Blood transfusion without reported diagnosis    CHF (congestive heart failure) (HCC)    Chronic diastolic heart failure (HCC) 02/28/2020   Chronic kidney disease    Dialysis Mon, Wed, Fri   Colitis        Diverticulosis 04/12/16   also seen: sigmoid and rectal erythema, path:    Dyspnea    Heart murmur    Hemorrhoids 03/2016   bleeding at 04/12/16 colonoscopy, treated with  APC laser. cauterization   Hyperlipidemia    Hypertension    OSA (obstructive sleep apnea) 03/27/2013    Past Surgical History:  Procedure Laterality Date   A/V SHUNTOGRAM Right 04/17/2019   Procedure: A/V SHUNTOGRAM;  Surgeon: Annice Needy, MD;  Location: [facility];  Service: Cardiovascular;  Laterality: Right;   APPLICATION OF WOUND VAC Right 12/13/2016  Procedure: APPLICATION OF WOUND VAC;  Surgeon: Annice Needy, MD;  Location: [facility];  Service: Vascular;  Laterality: Right;   APPLICATION OF WOUND VAC Right 12/19/2016   Procedure: APPLICATION OF WOUND VAC;  Surgeon: Annice Needy, MD;  Location: [facility];  Service: Vascular;  Laterality: Right;   COLONOSCOPY N/A 04/12/2016   Procedure: COLONOSCOPY;  Surgeon: Napoleon Form, MD;  Location: [facility]OS[facility];  Service: Endoscopy;  Laterality: N/A;   DIALYSIS/PERMA CATHETER INSERTION N/A 02/26/2017   Procedure: Dialysis/Perma Catheter Insertion;  Surgeon: Annice Needy, MD;  Location: [facility];  Service: Cardiovascular;  Laterality: N/A;   DIALYSIS/PERMA CATHETER INSERTION N/A 03/26/2018    Procedure: DIALYSIS/PERMA CATHETER INSERTION;  Surgeon: Renford Dills, MD;  Location: [facility];  Service: Cardiovascular;  Laterality: N/A;   DIALYSIS/PERMA CATHETER REMOVAL N/A 01/16/2019   Procedure: DIALYSIS/PERMA CATHETER REMOVAL;  Surgeon: Annice Needy, MD;  Location: [facility];  Service: Cardiovascular;  Laterality: N/A;   EXCHANGE OF A DIALYSIS CATHETER  11/20/2016   Procedure: Exchange Of A Dialysis Catheter;  Surgeon: Annice Needy, MD;  Location: [facility] INVASIVE CV LAB;  Service: Cardiovascular;;   fistulagrams Right    HEMATOMA EVACUATION Right 12/13/2016   Procedure: EVACUATION HEMATOMA;  Surgeon: Annice Needy, MD;  Location: [facility];  Service: Vascular;  Laterality: Right;   HEMATOMA EVACUATION Right 12/19/2016   Procedure: EVACUATIONof seroma;  Surgeon: Annice Needy, MD;  Location: [facility];  Service: Vascular;  Laterality: Right;   HOT HEMOSTASIS N/A 04/12/2016   Procedure: HOT HEMOSTASIS (ARGON PLASMA COAGULATION/BICAP);  Surgeon: Napoleon Form, MD;  Location: [facility] ENDOSCOPY;  Service: Endoscopy;  Laterality: N/A;   KIDNEY TRANSPLANT     LIGATIONS OF HERO GRAFT Right 04/25/2018   Procedure: LIGATIONS OF HERO GRAFT ( REVISION );  Surgeon: Annice Needy, MD;  Location: [facility];  Service: Vascular;  Laterality: Right;   PERIPHERAL VASCULAR CATHETERIZATION Bilateral 10/02/2016   Procedure: Upper Extremity Venography;  Surgeon: Annice Needy, MD;  Location: [facility];  Service: Cardiovascular;  Laterality: Bilateral;   PERIPHERAL VASCULAR CATHETERIZATION Right 11/08/2016   Procedure: DIALYSIS/PERMA CATHETER REMOVAL right Jugular;  Surgeon: Annice Needy, MD;  Location: [facility];  Service: Vascular;  Laterality: Right;   PERIPHERAL VASCULAR CATHETERIZATION Left 11/08/2016   Procedure: DIALYSIS/PERMA CATHETER INSERTION left jugular with u/s guide and flouroscan;  Surgeon: Annice Needy, MD;  Location: [facility];  Service: Vascular;  Laterality: Left;   PERIPHERAL  VASCULAR CATHETERIZATION N/A 11/08/2016   Procedure: IVC FILTER INSERTION;  Surgeon: Annice Needy, MD;  Location: [facility];  Service: Vascular;  Laterality: N/A;   perm a cath in Rt upper chest Right    THORACENTESIS     UPPER EXTREMITY ANGIOGRAPHY Right 11/28/2018   Procedure: UPPER EXTREMITY ANGIOGRAPHY;  Surgeon: Annice Needy, MD;  Location: [facility];  Service: Cardiovascular;  Laterality: Right;   UPPER EXTREMITY VENOGRAPHY Bilateral 11/27/2019   Procedure: UPPER EXTREMITY VENOGRAPHY;  Surgeon: Annice Needy, MD;  Location: [facility];  Service: Cardiovascular;  Laterality: Bilateral;   VASCULAR ACCESS DEVICE INSERTION Right 11/08/2016   Procedure: INSERTION OF HERO VASCULAR ACCESS DEVICE ( GRAFT );  Surgeon: Annice Needy, MD;  Location: [facility];  Service: Vascular;  Laterality: Right;    Family History  Problem Relation Age of Onset   Heart disease Mother    Cancer Mother  ovarian   CAD Mother    Cancer Maternal Grandmother    Heart attack Maternal Grandmother    Lung cancer Maternal Grandmother    Cancer Maternal Grandfather    Heart attack Maternal Grandfather    Lung cancer Maternal Grandfather    Asthma Son    Asthma Son    Kidney failure Maternal Aunt    Diabetes Maternal Aunt     Social History   Socioeconomic History   Marital status: Married    Spouse name: Not on file   Number of children: 3   Years of education: Not on file   Highest education level: Not on file  Occupational History   Not on file  Tobacco Use   Smoking status: Never   Smokeless tobacco: Never  Vaping Use   Vaping status: Never Used  Substance and Sexual Activity   Alcohol use: No   Drug use: No   Sexual activity: Not on file  Other Topics Concern   Not on file  Social History Narrative   Live at home in private residence with wife, Bridgette   Social Determinants of Health   Financial Resource Strain: Low Risk  (03/08/2023)   Received from [facility], [facility]   Overall Financial Resource Strain (CARDIA)    Difficulty of Paying Living Expenses: Not hard at all  Food Insecurity: No Food Insecurity (03/08/2023)   Received from [facility], [facility]   Hunger Vital Sign    Worried About Running Out of Food in the Last Year: Never true    Ran Out of Food in the Last Year: Never true  Transportation Needs: Unmet Transportation Needs (03/08/2023)   Received from [facility], [facility]   PRAPARE - Transportation    In the past 12 months, has lack of transportation kept you from medical appointments or from getting medications?: Yes    Lack of Transportation (Non-Medical): No  Physical Activity: Not on file  Stress: Not on file  Social Connections: Not on file  Intimate Partner Violence: Not on file    Review of Systems  Respiratory:  Positive for apnea and shortness of breath.   Psychiatric/Behavioral:  Positive for sleep disturbance.     Vitals:   09/06/23 0945  BP: 128/84  Pulse: 81  SpO2: 94%     Physical Exam Constitutional:      Appearance: He is obese.  HENT:     Head: Normocephalic.     Nose: Nose normal.  Eyes:     Pupils: Pupils are equal, round, and reactive to light.  Cardiovascular:     Rate and Rhythm: Normal rate and regular rhythm.     Heart sounds: No murmur heard.    No friction rub.  Pulmonary:     Effort: No respiratory distress.     Breath sounds: No stridor. No wheezing or rhonchi.  Chest:     Chest wall: No tenderness.  Musculoskeletal:     Cervical back: No rigidity or tenderness.  Neurological:     Mental Status: He is alert.  Psychiatric:        Mood and Affect: Mood normal.       03/01/2023   11:00 AM  Results of the Epworth flowsheet  Sitting and reading 3  Watching TV 3  Sitting, inactive in a public place (e.g. a theatre or a meeting) 2  As a passenger in a car for  an  hour without a break 3  Lying down to rest in the afternoon when circumstances permit 3  Sitting and talking to someone 2  Sitting quietly after a lunch without alcohol 3  In a car, while stopped for a few minutes in traffic 0  Total score 19    Data Reviewed: Previous sleep study from 2014 reviewed showing mild obstructive sleep apnea He did have central events There was titrated CPAP of 17  Recent sleep study 06/08/2023 showing severe obstructive sleep apnea Titrated to BiPAP 19/15  Compliance data shows excellent compliance since resuming BiPAP Issues with mask leak  Assessment:  Severe obstructive sleep apnea -Continue BiPAP therapy -Issues with mask leak, will need a new mask  Daytime sleepiness should continue to improve  Chronic fatigue should continue to improve  History of heart failure/diastolic dysfunction -Graded activities as tolerated  Plan/Recommendations: Continue BiPAP therapy  Follow-up in about 3 months  DME referral for new mask  BiPAP settings of 19/15 with Bioflex of 1  Graded activities as tolerated  Weight loss efforts   Virl Diamond MD [facility] 09/09/2023, 8:32 AM  CC: Quentin Angst, MD

## 2024-01-06 ENCOUNTER — Telehealth: Admitting: Family

## 2024-01-06 DIAGNOSIS — H109 Unspecified conjunctivitis: Secondary | ICD-10-CM | POA: Diagnosis not present

## 2024-01-06 MED ORDER — POLYMYXIN B-TRIMETHOPRIM 10000-0.1 UNIT/ML-% OP SOLN: 1.0000 [drp] | Freq: Four times a day (QID) | OPHTHALMIC | 0 refills | Status: DC

## 2024-01-06 NOTE — Progress Notes (Signed)
E-Visit for Pink Eye   We are sorry that you are not feeling well.  Here is how we plan to help!  Based on what you have shared with me it looks like you have conjunctivitis.  Conjunctivitis is a common inflammatory or infectious condition of the eye that is often referred to as "pink eye".  In most cases it is contagious (viral or bacterial). However, not all conjunctivitis requires antibiotics (ex. Allergic).  We have made appropriate suggestions for you based upon your presentation.  I have prescribed Polytrim Ophthalmic drops 1-2 drops 4 times a day times 5 days  Pink eye can be highly contagious.  It is typically spread through direct contact with secretions, or contaminated objects or surfaces that one may have touched.  Strict handwashing is suggested with soap and water is urged.  If not available, use alcohol based had sanitizer.  Avoid unnecessary touching of the eye.  If you wear contact lenses, you will need to refrain from wearing them until you see no white discharge from the eye for at least 24 hours after being on medication.  You should see symptom improvement in 1-2 days after starting the medication regimen.  Call us if symptoms are not improved in 1-2 days.  Home Care: Wash your hands often! Do not wear your contacts until you complete your treatment plan. Avoid sharing towels, bed linen, personal items with a person who has pink eye. See attention for anyone in your home with similar symptoms.  Get Help Right Away If: Your symptoms do not improve. You develop blurred or loss of vision. Your symptoms worsen (increased discharge, pain or redness)   Thank you for choosing an e-visit.  Your e-visit answers were reviewed by a board certified advanced clinical practitioner to complete your personal care plan. Depending upon the condition, your plan could have included both over the counter or prescription medications.  Please review your pharmacy choice. Make sure the  pharmacy is open so you can pick up prescription now. If there is a problem, you may contact your provider through MyChart messaging and have the prescription routed to another pharmacy.  Your safety is important to us. If you have drug allergies check your prescription carefully.   For the next 24 hours you can use MyChart to ask questions about today's visit, request a non-urgent call back, or ask for a work or school excuse. You will get an email in the next two days asking about your experience. I hope that your e-visit has been valuable and will speed your recovery.   Approximately 5 minutes was spent documenting and reviewing patient's chart.   

## 2024-03-14 ENCOUNTER — Inpatient Hospital Stay (HOSPITAL_COMMUNITY)
Admission: EM | Admit: 2024-03-14 | Discharge: 2024-03-17 | DRG: 871 | Disposition: A | Discharge status: Home / Self Care | Attending: Internal Medicine | Admitting: Internal Medicine

## 2024-03-14 ENCOUNTER — Encounter (HOSPITAL_COMMUNITY): Payer: Self-pay | Admitting: *Deleted

## 2024-03-14 ENCOUNTER — Emergency Department (HOSPITAL_COMMUNITY)

## 2024-03-14 ENCOUNTER — Encounter (HOSPITAL_COMMUNITY): Payer: Self-pay

## 2024-03-14 ENCOUNTER — Ambulatory Visit (HOSPITAL_COMMUNITY): Admission: EM | Admit: 2024-03-14 | Discharge: 2024-03-14 | Disposition: A | Discharge status: Home / Self Care

## 2024-03-14 ENCOUNTER — Other Ambulatory Visit: Payer: Self-pay

## 2024-03-14 DIAGNOSIS — G4733 Obstructive sleep apnea (adult) (pediatric): Secondary | ICD-10-CM | POA: Diagnosis present

## 2024-03-14 DIAGNOSIS — I5032 Chronic diastolic (congestive) heart failure: Secondary | ICD-10-CM | POA: Diagnosis present

## 2024-03-14 DIAGNOSIS — Y830 Surgical operation with transplant of whole organ as the cause of abnormal reaction of the patient, or of later complication, without mention of misadventure at the time of the procedure: Secondary | ICD-10-CM | POA: Diagnosis present

## 2024-03-14 DIAGNOSIS — Z8249 Family history of ischemic heart disease and other diseases of the circulatory system: Secondary | ICD-10-CM

## 2024-03-14 DIAGNOSIS — N3000 Acute cystitis without hematuria: Secondary | ICD-10-CM

## 2024-03-14 DIAGNOSIS — R6520 Severe sepsis without septic shock: Secondary | ICD-10-CM | POA: Diagnosis present

## 2024-03-14 DIAGNOSIS — R350 Frequency of micturition: Secondary | ICD-10-CM | POA: Diagnosis not present

## 2024-03-14 DIAGNOSIS — N390 Urinary tract infection, site not specified: Secondary | ICD-10-CM | POA: Diagnosis present

## 2024-03-14 DIAGNOSIS — N179 Acute kidney failure, unspecified: Secondary | ICD-10-CM | POA: Diagnosis present

## 2024-03-14 DIAGNOSIS — N186 End stage renal disease: Secondary | ICD-10-CM | POA: Diagnosis present

## 2024-03-14 DIAGNOSIS — Z833 Family history of diabetes mellitus: Secondary | ICD-10-CM

## 2024-03-14 DIAGNOSIS — I132 Hypertensive heart and chronic kidney disease with heart failure and with stage 5 chronic kidney disease, or end stage renal disease: Secondary | ICD-10-CM | POA: Diagnosis present

## 2024-03-14 DIAGNOSIS — Z825 Family history of asthma and other chronic lower respiratory diseases: Secondary | ICD-10-CM

## 2024-03-14 DIAGNOSIS — N400 Enlarged prostate without lower urinary tract symptoms: Secondary | ICD-10-CM | POA: Diagnosis present

## 2024-03-14 DIAGNOSIS — Z79899 Other long term (current) drug therapy: Secondary | ICD-10-CM

## 2024-03-14 DIAGNOSIS — Z940 Kidney transplant status: Secondary | ICD-10-CM

## 2024-03-14 DIAGNOSIS — A419 Sepsis, unspecified organism: Secondary | ICD-10-CM | POA: Diagnosis not present

## 2024-03-14 DIAGNOSIS — B9620 Unspecified Escherichia coli [E. coli] as the cause of diseases classified elsewhere: Secondary | ICD-10-CM | POA: Diagnosis present

## 2024-03-14 DIAGNOSIS — Z888 Allergy status to other drugs, medicaments and biological substances status: Secondary | ICD-10-CM

## 2024-03-14 DIAGNOSIS — T8619 Other complication of kidney transplant: Secondary | ICD-10-CM | POA: Diagnosis present

## 2024-03-14 DIAGNOSIS — Z1152 Encounter for screening for COVID-19: Secondary | ICD-10-CM

## 2024-03-14 DIAGNOSIS — R509 Fever, unspecified: Secondary | ICD-10-CM

## 2024-03-14 DIAGNOSIS — J4489 Other specified chronic obstructive pulmonary disease: Secondary | ICD-10-CM | POA: Diagnosis present

## 2024-03-14 DIAGNOSIS — I10 Essential (primary) hypertension: Secondary | ICD-10-CM | POA: Diagnosis present

## 2024-03-14 DIAGNOSIS — E785 Hyperlipidemia, unspecified: Secondary | ICD-10-CM | POA: Diagnosis present

## 2024-03-14 DIAGNOSIS — Z7984 Long term (current) use of oral hypoglycemic drugs: Secondary | ICD-10-CM

## 2024-03-14 DIAGNOSIS — Z7982 Long term (current) use of aspirin: Secondary | ICD-10-CM

## 2024-03-14 DIAGNOSIS — D696 Thrombocytopenia, unspecified: Secondary | ICD-10-CM | POA: Diagnosis present

## 2024-03-14 LAB — CBC WITH DIFFERENTIAL/PLATELET
Abs Immature Granulocytes: 0.06 10*3/uL (ref 0.00–0.07)
Basophils Absolute: 0 10*3/uL (ref 0.0–0.1)
Basophils Relative: 0 %
Eosinophils Absolute: 0 10*3/uL (ref 0.0–0.5)
Eosinophils Relative: 0 %
HCT: 44.8 % (ref 39.0–52.0)
Hemoglobin: 14.6 g/dL (ref 13.0–17.0)
Immature Granulocytes: 1 %
Lymphocytes Relative: 3 %
Lymphs Abs: 0.2 10*3/uL — ABNORMAL LOW (ref 0.7–4.0)
MCH: 27.6 pg (ref 26.0–34.0)
MCHC: 32.6 g/dL (ref 30.0–36.0)
MCV: 84.7 fL (ref 80.0–100.0)
Monocytes Absolute: 0.6 10*3/uL (ref 0.1–1.0)
Monocytes Relative: 7 %
Neutro Abs: 7.6 10*3/uL (ref 1.7–7.7)
Neutrophils Relative %: 89 %
Platelets: 91 10*3/uL — ABNORMAL LOW (ref 150–400)
RBC: 5.29 MIL/uL (ref 4.22–5.81)
RDW: 15.9 % — ABNORMAL HIGH (ref 11.5–15.5)
Smear Review: NORMAL
WBC: 8.5 10*3/uL (ref 4.0–10.5)
nRBC: 0 % (ref 0.0–0.2)

## 2024-03-14 LAB — RESP PANEL BY RT-PCR (RSV, FLU A&B, COVID)  RVPGX2
Influenza A by PCR: NEGATIVE
Influenza B by PCR: NEGATIVE
Resp Syncytial Virus by PCR: NEGATIVE
SARS Coronavirus 2 by RT PCR: NEGATIVE

## 2024-03-14 LAB — COMPREHENSIVE METABOLIC PANEL WITH GFR
ALT: 29 U/L (ref 0–44)
AST: 36 U/L (ref 15–41)
Albumin: 3.7 g/dL (ref 3.5–5.0)
Alkaline Phosphatase: 59 U/L (ref 38–126)
Anion gap: 18 — ABNORMAL HIGH (ref 5–15)
BUN: 25 mg/dL — ABNORMAL HIGH (ref 6–20)
CO2: 18 mmol/L — ABNORMAL LOW (ref 22–32)
Calcium: 10.1 mg/dL (ref 8.9–10.3)
Chloride: 96 mmol/L — ABNORMAL LOW (ref 98–111)
Creatinine, Ser: 1.91 mg/dL — ABNORMAL HIGH (ref 0.61–1.24)
GFR, Estimated: 40 mL/min — ABNORMAL LOW (ref >60)
Glucose, Bld: 135 mg/dL — ABNORMAL HIGH (ref 70–99)
Potassium: 4.3 mmol/L (ref 3.5–5.1)
Sodium: 132 mmol/L — ABNORMAL LOW (ref 135–145)
Total Bilirubin: 1.9 mg/dL — ABNORMAL HIGH (ref 0.0–1.2)
Total Protein: 7.7 g/dL (ref 6.5–8.1)

## 2024-03-14 LAB — I-STAT CG4 LACTIC ACID, ED
Lactic Acid, Venous: 2.4 mmol/L (ref 0.5–1.9)
Lactic Acid, Venous: 2.2 mmol/L (ref 0.5–1.9)

## 2024-03-14 LAB — URINALYSIS, ROUTINE W REFLEX MICROSCOPIC
Bilirubin Urine: NEGATIVE
Glucose, UA: >=500 mg/dL — AB
Ketones, ur: 20 mg/dL — AB
Nitrite: POSITIVE — AB
Protein, ur: 100 mg/dL — AB
Specific Gravity, Urine: 1.017 (ref 1.005–1.030)
WBC, UA: >50 WBC/hpf (ref 0–5)
pH: 5 (ref 5.0–8.0)

## 2024-03-14 LAB — POCT URINALYSIS DIP (MANUAL ENTRY)
Bilirubin, UA: NEGATIVE
Glucose, UA: >=1000 mg/dL — AB
Nitrite, UA: POSITIVE — AB
Protein Ur, POC: >=300 mg/dL — AB
Spec Grav, UA: 1.02 (ref 1.010–1.025)
Urobilinogen, UA: 0.2 U/dL
pH, UA: 5.5 (ref 5.0–8.0)

## 2024-03-14 LAB — PROTIME-INR
INR: 1.4 — ABNORMAL HIGH (ref 0.8–1.2)
Prothrombin Time: 17.6 s — ABNORMAL HIGH (ref 11.4–15.2)

## 2024-03-14 MED ORDER — LACTATED RINGERS IV BOLUS (SEPSIS)
1000.0000 mL | Freq: Once | INTRAVENOUS | Status: AC
  Administered 2024-03-15: 1000 mL via INTRAVENOUS

## 2024-03-14 MED ORDER — IPRATROPIUM-ALBUTEROL 0.5-2.5 (3) MG/3ML IN SOLN
3.0000 mL | Freq: Once | RESPIRATORY_TRACT | Status: AC
  Administered 2024-03-15: 3 mL via RESPIRATORY_TRACT
  Filled 2024-03-14: qty 3

## 2024-03-14 MED ORDER — LACTATED RINGERS IV SOLN: INTRAVENOUS | Status: DC

## 2024-03-14 MED ORDER — IOHEXOL 350 MG/ML SOLN
75.0000 mL | Freq: Once | INTRAVENOUS | Status: AC | PRN
  Administered 2024-03-14: 75 mL via INTRAVENOUS

## 2024-03-14 MED ORDER — SODIUM CHLORIDE 0.9 % IV SOLN
2.0000 g | Freq: Once | INTRAVENOUS | Status: AC
  Administered 2024-03-14: 2 g via INTRAVENOUS
  Filled 2024-03-14: qty 20

## 2024-03-14 MED ORDER — LACTATED RINGERS IV BOLUS (SEPSIS)
1000.0000 mL | Freq: Once | INTRAVENOUS | Status: AC
  Administered 2024-03-14: 1000 mL via INTRAVENOUS

## 2024-03-14 NOTE — ED Notes (Signed)
 Unable to establish IV access. IV team consult placed

## 2024-03-14 NOTE — ED Provider Notes (Cosign Needed Addendum)
 [facility] Provider Note   CSN: [id number] Arrival date & time: 03/14/24  6440     History  Chief Complaint  Patient presents with   Fever    Jonathon Conley is a 58 y.o. male presenting with a 3-day history of increasing fever, body chills, body aches, along with right lower abdominal pain.  Of note he is a prior kidney transplant recipient x 2.  Was referred to the ED by transplant specialist who were concerned for ongoing infection.  Initially presented to outlying urgent care who referred him to the ED due to symptoms worrisome for sepsis, fever and tachycardia.  Of note, he has had decreased appetite, decreased p.o. intake, and general malaise and generalized weakness in addition to previously mentioned symptoms.   Fever Associated symptoms: chills and myalgias   Associated symptoms: no cough        Home Medications Prior to Admission medications   Medication Sig Start Date End Date Taking? Authorizing Provider  albuterol  (VENTOLIN  HFA) 108 (90 Base) MCG/ACT inhaler Inhale into the lungs.    [provider]  amLODipine  (NORVASC ) 5 MG tablet Take 1 tablet (5 mg total) by mouth in the morning and at bedtime. 01/22/20   [place], Peter M, MD  aspirin  EC 81 MG tablet Take 81 mg by mouth daily.     [provider]  carvedilol  (COREG ) 25 MG tablet Take 25 mg by mouth 2 (two) times daily.     [provider]  Cholecalciferol (D 1000) 25 MCG (1000 UT) capsule Take 1,000 Units by mouth daily. 08/27/23   [provider]  hydrALAZINE  (APRESOLINE ) 50 MG tablet Take 50 mg by mouth 3 (three) times daily. 09/09/19   [provider]  JARDIANCE 10 MG TABS tablet  05/25/23   [provider]  losartan  (COZAAR ) 100 MG tablet Take 100 mg by mouth daily. 09/09/19   [provider]  predniSONE  (DELTASONE ) 5 MG tablet Take 5 mg by mouth daily.    [provider]  rosuvastatin (CRESTOR) 5  MG tablet Take 5 mg by mouth at bedtime.    [provider]  senna-docusate (SENOKOT-S) 8.6-50 MG tablet Take by mouth. 02/27/22   [provider]  tacrolimus  ER (ENVARSUS  XR) 1 MG TB24 Take 6 mg by mouth. 02/27/22 11/27/24  [provider]  tamsulosin (FLOMAX) 0.4 MG CAPS capsule Take 0.4 mg by mouth. 02/27/22   [provider]  trimethoprim -polymyxin b  (POLYTRIM ) ophthalmic solution Place 1 drop into the right eye every 6 (six) hours. 01/06/24   Yevette Hem, FNP      Allergies    Lisinopril     Review of Systems   Review of Systems  Constitutional:  Positive for appetite change, chills, fatigue and fever.  Respiratory:  Negative for cough and shortness of breath.   Musculoskeletal:  Positive for arthralgias and myalgias.  Neurological:  Positive for dizziness and weakness.    Physical Exam Updated Vital Signs BP (!) 138/97   Pulse (!) 105   Temp 100.3 F (37.9 C) (Oral)   Resp 18   Ht 5\' 11"  (1.803 m)   Wt 97.5 kg   SpO2 100%   BMI 29.99 kg/m  Physical Exam Vitals and nursing note reviewed.  Constitutional:      General: He is not in acute distress.    Appearance: Normal appearance. He is ill-appearing.  HENT:     Head: Normocephalic and atraumatic.  Mouth/Throat:     Mouth: Mucous membranes are moist.     Pharynx: Oropharynx is clear.  Eyes:     Extraocular Movements: Extraocular movements intact.     Conjunctiva/sclera: Conjunctivae normal.     Pupils: Pupils are equal, round, and reactive to light.  Cardiovascular:     Rate and Rhythm: Normal rate and regular rhythm.     Pulses: Normal pulses.     Heart sounds: Normal heart sounds. No murmur heard.    No friction rub. No gallop.  Pulmonary:     Effort: Pulmonary effort is normal.     Breath sounds: Examination of the right-middle field reveals decreased breath sounds. Examination of the right-lower field reveals decreased breath sounds. Decreased breath sounds present. No  wheezing, rhonchi or rales.  Abdominal:     General: Abdomen is flat. Bowel sounds are normal. There is no distension.     Palpations: Abdomen is soft. There is no hepatomegaly or splenomegaly.     Tenderness: There is no abdominal tenderness. There is no right CVA tenderness or left CVA tenderness.  Musculoskeletal:        General: Normal range of motion.     Cervical back: Normal range of motion and neck supple.     Right lower leg: No edema.     Left lower leg: No edema.  Skin:    General: Skin is warm and dry.     Capillary Refill: Capillary refill takes less than 2 seconds.  Neurological:     General: No focal deficit present.     Mental Status: He is alert. Mental status is at baseline.  Psychiatric:        Mood and Affect: Mood normal.     ED Results / Procedures / Treatments   Labs (all labs ordered are listed, but only abnormal results are displayed) Labs Reviewed  URINALYSIS, ROUTINE W REFLEX MICROSCOPIC - Abnormal; Notable for the following components:      Result Value   Color, Urine AMBER (*)    APPearance TURBID (*)    Glucose, UA >=500 (*)    Hgb urine dipstick LARGE (*)    Ketones, ur 20 (*)    Protein, ur 100 (*)    Nitrite POSITIVE (*)    Leukocytes,Ua LARGE (*)    Bacteria, UA MANY (*)    All other components within normal limits  COMPREHENSIVE METABOLIC PANEL WITH GFR - Abnormal; Notable for the following components:   Sodium 132 (*)    Chloride 96 (*)    CO2 18 (*)    Glucose, Bld 135 (*)    BUN 25 (*)    Creatinine, Ser 1.91 (*)    Total Bilirubin 1.9 (*)    GFR, Estimated 40 (*)    Anion gap 18 (*)    All other components within normal limits  CBC WITH DIFFERENTIAL/PLATELET - Abnormal; Notable for the following components:   RDW 15.9 (*)    Platelets 91 (*)    Lymphs Abs 0.2 (*)    All other components within normal limits  PROTIME-INR - Abnormal; Notable for the following components:   Prothrombin Time 17.6 (*)    INR 1.4 (*)    All  other components within normal limits  I-STAT CG4 LACTIC ACID, ED - Abnormal; Notable for the following components:   Lactic Acid, Venous 2.4 (*)    All other components within normal limits  I-STAT CG4 LACTIC ACID, ED - Abnormal; Notable for the following  components:   Lactic Acid, Venous 2.2 (*)    All other components within normal limits  CULTURE, BLOOD (ROUTINE X 2)  RESP PANEL BY RT-PCR (RSV, FLU A&B, COVID)  RVPGX2  CULTURE, BLOOD (ROUTINE X 2)  URINE CULTURE    EKG None  Radiology CT ABDOMEN PELVIS W CONTRAST Result Date: 03/14/2024 CLINICAL DATA:  Fever, pyelonephritis EXAM: CT ABDOMEN AND PELVIS WITH CONTRAST TECHNIQUE: Multidetector CT imaging of the abdomen and pelvis was performed using the standard protocol following bolus administration of intravenous contrast. RADIATION DOSE REDUCTION: This exam was performed according to the departmental dose-optimization program which includes automated exposure control, adjustment of the mA and/or kV according to patient size and/or use of iterative reconstruction technique. CONTRAST:  75mL OMNIPAQUE  IOHEXOL  350 MG/ML SOLN COMPARISON:  03/07/2022 FINDINGS: Lower chest: Stable right basilar volume loss with pleural thickening, calcification, abnormalities atelectasis, incompletely visualized. No acute abnormality. Hepatobiliary: No focal liver abnormality is seen. No gallstones, gallbladder wall thickening, or biliary dilatation. Pancreas: Unremarkable Spleen: Unremarkable Adrenals/Urinary Tract: The adrenal glands are unremarkable. Severe atrophy of the native kidneys bilaterally as well as a right lower quadrant transplant kidney. Left lower quadrant transplant kidney demonstrates normal size and cortical thickness. Normal cortical enhancement of the left transplant kidney. No hydronephrosis. No perinephric fluid collections. No intrarenal or ureteral calculi. Bladder unremarkable. Stomach/Bowel: Multiple probable small duodenal diverticula  involving the third portion of the duodenum. Stomach, small bowel, and large bowel are otherwise unremarkable. No evidence of obstruction or focal inflammation. Appendix normal. Vascular/Lymphatic: Right small bore common iliac venous stent is in place. Mild aortoiliac atherosclerotic calcification. Left femoral-distal bypass grafting is partially visualized. No pathologic adenopathy within the abdomen and pelvis. Reproductive: Mild prostatic hypertrophy. Other: Small fat containing umbilical and bilateral inguinal hernias are present. Numerous vascular collaterals are seen within the numerous small venous vascular collaterals are seen the subcutaneous soft tissues of the anterior abdominal wall, unchanged. Small fat containing umbilical and bilateral inguinal hernias are present. Musculoskeletal: No acute bone abnormality. No lytic or blastic bone lesion. Osseous structures are age appropriate. IMPRESSION: 1. No acute intra-abdominal pathology is identified. 2. Normal appearance of the left lower quadrant transplant kidney. No hydronephrosis. Aortic Atherosclerosis (ICD10-I70.0). Electronically Signed   By: Worthy Heads M.D.   On: 03/14/2024 23:02   DG Chest Port 1 View Result Date: 03/14/2024 CLINICAL DATA:  Sepsis EXAM: PORTABLE CHEST 1 VIEW COMPARISON:  10/20/2019 FINDINGS: Right-sided volume loss, right pleural thickening, and rounded mass at the right costophrenic angle is again seen is in keeping with changes of rounded atelectasis noted prior CT examination of 05/02/2018. Left lung is clear. No pneumothorax. No pleural effusion on the left. Cardiac size within normal limits. Pulmonary vascularity is normal. No acute bone abnormality. IMPRESSION: 1. No radiographic evidence of acute cardiopulmonary disease. 2. Right-sided volume loss, right pleural thickening, and rounded mass at the right costophrenic angle in keeping with changes of rounded atelectasis noted on prior CT examination of 05/02/2018.  Electronically Signed   By: Worthy Heads M.D.   On: 03/14/2024 21:15    Procedures .Critical Care  Performed by: Juanetta Nordmann, PA Authorized by: Juanetta Nordmann, PA   Critical care provider statement:    Critical care time (minutes):  30   Critical care time was exclusive of:  Separately billable procedures and treating other patients   Critical care was necessary to treat or prevent imminent or life-threatening deterioration of the following conditions:  Sepsis and renal failure  Critical care was time spent personally by me on the following activities:  Discussions with consultants, evaluation of patient's response to treatment, examination of patient, ordering and review of laboratory studies, ordering and review of radiographic studies, pulse oximetry and re-evaluation of patient's condition   Care discussed with: admitting provider       Medications Ordered in ED Medications  lactated ringers  infusion (has no administration in time range)  lactated ringers  bolus 1,000 mL (has no administration in time range)    And  lactated ringers  bolus 1,000 mL (has no administration in time range)    And  lactated ringers  bolus 1,000 mL (1,000 mLs Intravenous New Bag/Given 03/14/24 2149)  ipratropium-albuterol  (DUONEB) 0.5-2.5 (3) MG/3ML nebulizer solution 3 mL (has no administration in time range)  cefTRIAXone  (ROCEPHIN ) 2 g in sodium chloride  0.9 % 100 mL IVPB (0 g Intravenous Stopped 03/14/24 2218)  iohexol  (OMNIPAQUE ) 350 MG/ML injection 75 mL (75 mLs Intravenous Contrast Given 03/14/24 2252)    ED Course/ Medical Decision Making/ A&P                                 Medical Decision Making Amount and/or Complexity of Data Reviewed Labs: ordered.  Risk Prescription drug management. Decision regarding hospitalization.   Medical Decision Making:   Jonathon Conley is a 58 y.o. male who presented to the ED today with increased urinary frequency, lower abdominal pain and  fever detailed above.    Additional history discussed with patient's family/caregivers.  Patient's presentation is complicated by their history of CKD.  Patient placed on continuous vitals and telemetry monitoring while in ED which was reviewed periodically.  Complete initial physical exam performed, notably the patient  was alert and oriented and while not in apparent distress is obviously ill-appearing.  Physical exam did show decreased lung sounds on the right mid to lower fields.  Palpation the abdomen revealed a nontender abdomen that was nondistended.  There is no CVA tenderness noted.  Skin is dry and hot to the touch.  Reviewed and confirmed nursing documentation for past medical history, family history, social history.    Initial Assessment:   With the patient's presentation of dysuria along with fever, most likely diagnosis is sepsis secondary to urinary tract infection.   Initial Plan:  Due to vital signs, initiate code sepsis Provide IV antibiotics along with IV fluid boluses. Screening labs including CBC and Metabolic panel to evaluate for infectious or metabolic etiology of disease.  Urinalysis with reflex culture ordered to evaluate for UTI or relevant urologic/nephrologic pathology.  CXR to evaluate for structural/infectious intrathoracic pathology.  EKG to evaluate for cardiac pathology CT scan of the abdomen to assess for abdominal pathology. Objective evaluation as below reviewed   Initial Study Results:   Laboratory  All laboratory results reviewed without evidence of clinically relevant pathology.   Exceptions include: Elevated lactate of 2.4, urinalysis heavily suggestive of UTI with turbid urine, glucosuria greater than 500, proteinuria, positive nitrates and leukocytes.  EKG EKG was reviewed independently. Rate, rhythm, axis, intervals all examined and without medically relevant abnormality. ST segments without concerns for elevations.    Radiology:  All images  reviewed independently. Agree with radiology report at this time.   CT ABDOMEN PELVIS W CONTRAST Result Date: 03/14/2024 CLINICAL DATA:  Fever, pyelonephritis EXAM: CT ABDOMEN AND PELVIS WITH CONTRAST TECHNIQUE: Multidetector CT imaging of the abdomen and pelvis was performed using the standard  protocol following bolus administration of intravenous contrast. RADIATION DOSE REDUCTION: This exam was performed according to the departmental dose-optimization program which includes automated exposure control, adjustment of the mA and/or kV according to patient size and/or use of iterative reconstruction technique. CONTRAST:  75mL OMNIPAQUE  IOHEXOL  350 MG/ML SOLN COMPARISON:  03/07/2022 FINDINGS: Lower chest: Stable right basilar volume loss with pleural thickening, calcification, abnormalities atelectasis, incompletely visualized. No acute abnormality. Hepatobiliary: No focal liver abnormality is seen. No gallstones, gallbladder wall thickening, or biliary dilatation. Pancreas: Unremarkable Spleen: Unremarkable Adrenals/Urinary Tract: The adrenal glands are unremarkable. Severe atrophy of the native kidneys bilaterally as well as a right lower quadrant transplant kidney. Left lower quadrant transplant kidney demonstrates normal size and cortical thickness. Normal cortical enhancement of the left transplant kidney. No hydronephrosis. No perinephric fluid collections. No intrarenal or ureteral calculi. Bladder unremarkable. Stomach/Bowel: Multiple probable small duodenal diverticula involving the third portion of the duodenum. Stomach, small bowel, and large bowel are otherwise unremarkable. No evidence of obstruction or focal inflammation. Appendix normal. Vascular/Lymphatic: Right small bore common iliac venous stent is in place. Mild aortoiliac atherosclerotic calcification. Left femoral-distal bypass grafting is partially visualized. No pathologic adenopathy within the abdomen and pelvis. Reproductive: Mild prostatic  hypertrophy. Other: Small fat containing umbilical and bilateral inguinal hernias are present. Numerous vascular collaterals are seen within the numerous small venous vascular collaterals are seen the subcutaneous soft tissues of the anterior abdominal wall, unchanged. Small fat containing umbilical and bilateral inguinal hernias are present. Musculoskeletal: No acute bone abnormality. No lytic or blastic bone lesion. Osseous structures are age appropriate. IMPRESSION: 1. No acute intra-abdominal pathology is identified. 2. Normal appearance of the left lower quadrant transplant kidney. No hydronephrosis. Aortic Atherosclerosis (ICD10-I70.0). Electronically Signed   By: Worthy Heads M.D.   On: 03/14/2024 23:02   DG Chest Port 1 View Result Date: 03/14/2024 CLINICAL DATA:  Sepsis EXAM: PORTABLE CHEST 1 VIEW COMPARISON:  10/20/2019 FINDINGS: Right-sided volume loss, right pleural thickening, and rounded mass at the right costophrenic angle is again seen is in keeping with changes of rounded atelectasis noted prior CT examination of 05/02/2018. Left lung is clear. No pneumothorax. No pleural effusion on the left. Cardiac size within normal limits. Pulmonary vascularity is normal. No acute bone abnormality. IMPRESSION: 1. No radiographic evidence of acute cardiopulmonary disease. 2. Right-sided volume loss, right pleural thickening, and rounded mass at the right costophrenic angle in keeping with changes of rounded atelectasis noted on prior CT examination of 05/02/2018. Electronically Signed   By: Worthy Heads M.D.   On: 03/14/2024 21:15      Consults: Case discussed with Sanna Crystal, MD who accepts for admission.   Reassessment and Plan:   Physical exam clinical history demonstrates a likely sepsis due to presence of tachycardia, tachypnea, and fever.  Further noted elevated lactate on lab workup at 2.2, AKI noted with GFR at 40 and creatinine elevated to 1.91.  Urine workup is heavily indicative of a  urinary tract infection with large leukocytes, positive nitrites, proteinuria and ketonuria.  CBC does not demonstrate a leukocytosis at this time however have begun antibiotics of ceftriaxone  to manage likely sepsis secondary to urinary tract infection.  Further concern for coagulopathy induced by infection as his INR is 1.4 with previous INR being within normal limits.  As a result of presenting likely urosepsis, will consult with hospitalist team for admission for continued antibiotic therapy and monitoring of his condition.  This was discussed thoroughly with the patient and his wife  and they both agree and understand that this is necessary.          Final Clinical Impression(s) / ED Diagnoses Final diagnoses:  Sepsis with acute renal failure without septic shock, due to unspecified organism, unspecified acute renal failure type (HCC)  Acute cystitis without hematuria    Rx / DC Orders ED Discharge Orders     None         Juanetta Nordmann, PA 03/15/24 0010    Juanetta Nordmann, PA 03/15/24 0012    Teddi Favors, DO 03/18/24 1600

## 2024-03-14 NOTE — ED Provider Triage Note (Signed)
 Emergency Medicine Provider Triage Evaluation Note  KAMARRI LOVVORN , a 58 y.o. male  was evaluated in triage.  Pt complains of dysuria, increased frequency, fevers, chills.  No abdominal pain.  Review of Systems  Positive:  Negative:   Physical Exam  BP (!) 144/90 (BP Location: Left Arm)   Pulse (!) 106   Temp 100.3 F (37.9 C) (Oral)   Resp 18   Ht 5\' 11"  (1.803 m)   Wt 97.5 kg   SpO2 98%   BMI 29.99 kg/m  Gen:   Awake, ill-appearing Resp:  Normal effort  MSK:   Moves extremities without difficulty  Other:  Abdomen nontender, negative CVAT  Medical Decision Making  Medically screening exam initiated at 7:48 PM.  Appropriate orders placed.  Delton Filbert was informed that the remainder of the evaluation will be completed by another provider, this initial triage assessment does not replace that evaluation, and the importance of remaining in the ED until their evaluation is complete.  Patient presents tachycardic, febrile today, with presumed source of UTI.  Will start sepsis workup.  Not suitable for waiting room.   Felicie Horning, PA-C 03/14/24 1949

## 2024-03-14 NOTE — ED Provider Notes (Incomplete)
 Jonathon Conley Provider Note   CSN: [id number] Arrival date & time: 03/14/24  6213     History {Add pertinent medical, surgical, social history, OB history to HPI:1} Chief Complaint  Patient presents with  . Fever    Jonathon Conley is a 58 y.o. male presenting with a 3-day history of increasing fever, body chills, body aches, along with right lower abdominal pain.  Of note he is a prior kidney transplant recipient x 2.  Was referred to the ED by transplant specialist who were concerned for ongoing infection.  Initially presented to outlying urgent care who referred him to the ED due to symptoms worrisome for sepsis, fever and tachycardia.  Of note, he has had decreased appetite, decreased p.o. intake, and general malaise and generalized weakness in addition to previously mentioned symptoms.   Fever Associated symptoms: chills and myalgias   Associated symptoms: no cough        Home Medications Prior to Admission medications   Medication Sig Start Date End Date Taking? Authorizing Provider  albuterol  (VENTOLIN  HFA) 108 (90 Base) MCG/ACT inhaler Inhale into the lungs.    [provider]  amLODipine  (NORVASC ) 5 MG tablet Take 1 tablet (5 mg total) by mouth in the morning and at bedtime. 01/22/20   [place], Peter M, MD  aspirin  EC 81 MG tablet Take 81 mg by mouth daily.     [provider]  carvedilol  (COREG ) 25 MG tablet Take 25 mg by mouth 2 (two) times daily.     [provider]  Cholecalciferol (D 1000) 25 MCG (1000 UT) capsule Take 1,000 Units by mouth daily. 08/27/23   [provider]  hydrALAZINE  (APRESOLINE ) 50 MG tablet Take 50 mg by mouth 3 (three) times daily. 09/09/19   [provider]  JARDIANCE 10 MG TABS tablet  05/25/23   [provider]  losartan  (COZAAR ) 100 MG tablet Take 100 mg by mouth daily. 09/09/19   [provider]  predniSONE  (DELTASONE ) 5 MG tablet Take 5 mg  by mouth daily.    [provider]  rosuvastatin (CRESTOR) 5 MG tablet Take 5 mg by mouth at bedtime.    [provider]  senna-docusate (SENOKOT-S) 8.6-50 MG tablet Take by mouth. 02/27/22   [provider]  tacrolimus  ER (ENVARSUS  XR) 1 MG TB24 Take 6 mg by mouth. 02/27/22 11/27/24  [provider]  tamsulosin (FLOMAX) 0.4 MG CAPS capsule Take 0.4 mg by mouth. 02/27/22   [provider]  trimethoprim -polymyxin b  (POLYTRIM ) ophthalmic solution Place 1 drop into the right eye every 6 (six) hours. 01/06/24   Yevette Hem, FNP      Allergies    Lisinopril     Review of Systems   Review of Systems  Constitutional:  Positive for appetite change, chills, fatigue and fever.  Respiratory:  Negative for cough and shortness of breath.   Musculoskeletal:  Positive for arthralgias and myalgias.  Neurological:  Positive for dizziness and weakness.    Physical Exam Updated Vital Signs BP (!) 138/97   Pulse (!) 105   Temp 100.3 F (37.9 C) (Oral)   Resp 18   Ht 5\' 11"  (1.803 m)   Wt 97.5 kg   SpO2 100%   BMI 29.99 kg/m  Physical Exam Vitals and nursing note reviewed.  Constitutional:      General: He is not in acute distress.    Appearance: Normal appearance. He is ill-appearing.  HENT:  Head: Normocephalic and atraumatic.     Mouth/Throat:     Mouth: Mucous membranes are moist.     Pharynx: Oropharynx is clear.  Eyes:     Extraocular Movements: Extraocular movements intact.     Conjunctiva/sclera: Conjunctivae normal.     Pupils: Pupils are equal, round, and reactive to light.  Cardiovascular:     Rate and Rhythm: Normal rate and regular rhythm.     Pulses: Normal pulses.     Heart sounds: Normal heart sounds. No murmur heard.    No friction rub. No gallop.  Pulmonary:     Effort: Pulmonary effort is normal.     Breath sounds: Examination of the right-middle field reveals decreased breath sounds. Examination of the right-lower field  reveals decreased breath sounds. Decreased breath sounds present. No wheezing, rhonchi or rales.  Abdominal:     General: Abdomen is flat. Bowel sounds are normal. There is no distension.     Palpations: Abdomen is soft. There is no hepatomegaly or splenomegaly.     Tenderness: There is no abdominal tenderness. There is no right CVA tenderness or left CVA tenderness.  Musculoskeletal:        General: Normal range of motion.     Cervical back: Normal range of motion and neck supple.     Right lower leg: No edema.     Left lower leg: No edema.  Skin:    General: Skin is warm and dry.     Capillary Refill: Capillary refill takes less than 2 seconds.  Neurological:     General: No focal deficit present.     Mental Status: He is alert. Mental status is at baseline.  Psychiatric:        Mood and Affect: Mood normal.     ED Results / Procedures / Treatments   Labs (all labs ordered are listed, but only abnormal results are displayed) Labs Reviewed  URINALYSIS, ROUTINE W REFLEX MICROSCOPIC - Abnormal; Notable for the following components:      Result Value   Color, Urine AMBER (*)    APPearance TURBID (*)    Glucose, UA >=500 (*)    Hgb urine dipstick LARGE (*)    Ketones, ur 20 (*)    Protein, ur 100 (*)    Nitrite POSITIVE (*)    Leukocytes,Ua LARGE (*)    Bacteria, UA MANY (*)    All other components within normal limits  COMPREHENSIVE METABOLIC PANEL WITH GFR - Abnormal; Notable for the following components:   Sodium 132 (*)    Chloride 96 (*)    CO2 18 (*)    Glucose, Bld 135 (*)    BUN 25 (*)    Creatinine, Ser 1.91 (*)    Total Bilirubin 1.9 (*)    GFR, Estimated 40 (*)    Anion gap 18 (*)    All other components within normal limits  CBC WITH DIFFERENTIAL/PLATELET - Abnormal; Notable for the following components:   RDW 15.9 (*)    Platelets 91 (*)    Lymphs Abs 0.2 (*)    All other components within normal limits  PROTIME-INR - Abnormal; Notable for the following  components:   Prothrombin Time 17.6 (*)    INR 1.4 (*)    All other components within normal limits  I-STAT CG4 LACTIC ACID, ED - Abnormal; Notable for the following components:   Lactic Acid, Venous 2.4 (*)    All other components within normal limits  I-STAT CG4 LACTIC  ACID, ED - Abnormal; Notable for the following components:   Lactic Acid, Venous 2.2 (*)    All other components within normal limits  CULTURE, BLOOD (ROUTINE X 2)  RESP PANEL BY RT-PCR (RSV, FLU A&B, COVID)  RVPGX2  CULTURE, BLOOD (ROUTINE X 2)  URINE CULTURE    EKG None  Radiology CT ABDOMEN PELVIS W CONTRAST Result Date: 03/14/2024 CLINICAL DATA:  Fever, pyelonephritis EXAM: CT ABDOMEN AND PELVIS WITH CONTRAST TECHNIQUE: Multidetector CT imaging of the abdomen and pelvis was performed using the standard protocol following bolus administration of intravenous contrast. RADIATION DOSE REDUCTION: This exam was performed according to the departmental dose-optimization program which includes automated exposure control, adjustment of the mA and/or kV according to patient size and/or use of iterative reconstruction technique. CONTRAST:  75mL OMNIPAQUE IOHEXOL 350 MG/ML SOLN COMPARISON:  03/07/2022 FINDINGS: Lower chest: Stable right basilar volume loss with pleural thickening, calcification, abnormalities atelectasis, incompletely visualized. No acute abnormality. Hepatobiliary: No focal liver abnormality is seen. No gallstones, gallbladder wall thickening, or biliary dilatation. Pancreas: Unremarkable Spleen: Unremarkable Adrenals/Urinary Tract: The adrenal glands are unremarkable. Severe atrophy of the native kidneys bilaterally as well as a right lower quadrant transplant kidney. Left lower quadrant transplant kidney demonstrates normal size and cortical thickness. Normal cortical enhancement of the left transplant kidney. No hydronephrosis. No perinephric fluid collections. No intrarenal or ureteral calculi. Bladder unremarkable.  Stomach/Bowel: Multiple probable small duodenal diverticula involving the third portion of the duodenum. Stomach, small bowel, and large bowel are otherwise unremarkable. No evidence of obstruction or focal inflammation. Appendix normal. Vascular/Lymphatic: Right small bore common iliac venous stent is in place. Mild aortoiliac atherosclerotic calcification. Left femoral-distal bypass grafting is partially visualized. No pathologic adenopathy within the abdomen and pelvis. Reproductive: Mild prostatic hypertrophy. Other: Small fat containing umbilical and bilateral inguinal hernias are present. Numerous vascular collaterals are seen within the numerous small venous vascular collaterals are seen the subcutaneous soft tissues of the anterior abdominal wall, unchanged. Small fat containing umbilical and bilateral inguinal hernias are present. Musculoskeletal: No acute bone abnormality. No lytic or blastic bone lesion. Osseous structures are age appropriate. IMPRESSION: 1. No acute intra-abdominal pathology is identified. 2. Normal appearance of the left lower quadrant transplant kidney. No hydronephrosis. Aortic Atherosclerosis (ICD10-I70.0). Electronically Signed   By: Worthy Heads M.D.   On: 03/14/2024 23:02   DG Chest Port 1 View Result Date: 03/14/2024 CLINICAL DATA:  Sepsis EXAM: PORTABLE CHEST 1 VIEW COMPARISON:  10/20/2019 FINDINGS: Right-sided volume loss, right pleural thickening, and rounded mass at the right costophrenic angle is again seen is in keeping with changes of rounded atelectasis noted prior CT examination of 05/02/2018. Left lung is clear. No pneumothorax. No pleural effusion on the left. Cardiac size within normal limits. Pulmonary vascularity is normal. No acute bone abnormality. IMPRESSION: 1. No radiographic evidence of acute cardiopulmonary disease. 2. Right-sided volume loss, right pleural thickening, and rounded mass at the right costophrenic angle in keeping with changes of rounded  atelectasis noted on prior CT examination of 05/02/2018. Electronically Signed   By: Worthy Heads M.D.   On: 03/14/2024 21:15    Procedures Procedures  {Document cardiac monitor, telemetry assessment procedure when appropriate:1}  Medications Ordered in ED Medications  lactated ringers infusion (has no administration in time range)  lactated ringers bolus 1,000 mL (has no administration in time range)    And  lactated ringers bolus 1,000 mL (has no administration in time range)    And  lactated ringers bolus 1,000  mL (1,000 mLs Intravenous New Bag/Given 03/14/24 2149)  ipratropium-albuterol  (DUONEB) 0.5-2.5 (3) MG/3ML nebulizer solution 3 mL (has no administration in time range)  cefTRIAXone (ROCEPHIN) 2 g in sodium chloride  0.9 % 100 mL IVPB (0 g Intravenous Stopped 03/14/24 2218)  iohexol (OMNIPAQUE) 350 MG/ML injection 75 mL (75 mLs Intravenous Contrast Given 03/14/24 2252)    ED Course/ Medical Decision Making/ A&P   {   Click here for ABCD2, HEART and other calculatorsREFRESH Note before signing :1}                              Medical Decision Making Amount and/or Complexity of Data Reviewed Labs: ordered.  Risk Prescription drug management. Decision regarding hospitalization.   Medical Decision Making:   Jonathon Conley is a 58 y.o. male who presented to the ED today with increased urinary frequency, lower abdominal pain and fever detailed above.    Additional history discussed with patient's family/caregivers.  Patient's presentation is complicated by their history of CKD.  Patient placed on continuous vitals and telemetry monitoring while in ED which was reviewed periodically.  Complete initial physical exam performed, notably the patient  was alert and oriented and while not in apparent distress is obviously ill-appearing.  Physical exam did show decreased lung sounds on the right mid to lower fields.  Palpation the abdomen revealed a nontender abdomen that was  nondistended.  There is no CVA tenderness noted.  Skin is dry and hot to the touch.  Reviewed and confirmed nursing documentation for past medical history, family history, social history.    Initial Assessment:   With the patient's presentation of dysuria along with fever, most likely diagnosis is sepsis secondary to urinary tract infection.   Initial Plan:  Due to vital signs, initiate code sepsis Provide IV antibiotics along with IV fluid boluses. Screening labs including CBC and Metabolic panel to evaluate for infectious or metabolic etiology of disease.  Urinalysis with reflex culture ordered to evaluate for UTI or relevant urologic/nephrologic pathology.  CXR to evaluate for structural/infectious intrathoracic pathology.  EKG to evaluate for cardiac pathology CT scan of the abdomen to assess for abdominal pathology. Objective evaluation as below reviewed   Initial Study Results:   Laboratory  All laboratory results reviewed without evidence of clinically relevant pathology.   Exceptions include: Elevated lactate of 2.4, urinalysis heavily suggestive of UTI with turbid urine, glucosuria greater than 500, proteinuria, positive nitrates and leukocytes.  EKG EKG was reviewed independently. Rate, rhythm, axis, intervals all examined and without medically relevant abnormality. ST segments without concerns for elevations.    Radiology:  All images reviewed independently. Agree with radiology report at this time.   CT ABDOMEN PELVIS W CONTRAST Result Date: 03/14/2024 CLINICAL DATA:  Fever, pyelonephritis EXAM: CT ABDOMEN AND PELVIS WITH CONTRAST TECHNIQUE: Multidetector CT imaging of the abdomen and pelvis was performed using the standard protocol following bolus administration of intravenous contrast. RADIATION DOSE REDUCTION: This exam was performed according to the departmental dose-optimization program which includes automated exposure control, adjustment of the mA and/or kV according to  patient size and/or use of iterative reconstruction technique. CONTRAST:  75mL OMNIPAQUE IOHEXOL 350 MG/ML SOLN COMPARISON:  03/07/2022 FINDINGS: Lower chest: Stable right basilar volume loss with pleural thickening, calcification, abnormalities atelectasis, incompletely visualized. No acute abnormality. Hepatobiliary: No focal liver abnormality is seen. No gallstones, gallbladder wall thickening, or biliary dilatation. Pancreas: Unremarkable Spleen: Unremarkable Adrenals/Urinary Tract: The adrenal glands  are unremarkable. Severe atrophy of the native kidneys bilaterally as well as a right lower quadrant transplant kidney. Left lower quadrant transplant kidney demonstrates normal size and cortical thickness. Normal cortical enhancement of the left transplant kidney. No hydronephrosis. No perinephric fluid collections. No intrarenal or ureteral calculi. Bladder unremarkable. Stomach/Bowel: Multiple probable small duodenal diverticula involving the third portion of the duodenum. Stomach, small bowel, and large bowel are otherwise unremarkable. No evidence of obstruction or focal inflammation. Appendix normal. Vascular/Lymphatic: Right small bore common iliac venous stent is in place. Mild aortoiliac atherosclerotic calcification. Left femoral-distal bypass grafting is partially visualized. No pathologic adenopathy within the abdomen and pelvis. Reproductive: Mild prostatic hypertrophy. Other: Small fat containing umbilical and bilateral inguinal hernias are present. Numerous vascular collaterals are seen within the numerous small venous vascular collaterals are seen the subcutaneous soft tissues of the anterior abdominal wall, unchanged. Small fat containing umbilical and bilateral inguinal hernias are present. Musculoskeletal: No acute bone abnormality. No lytic or blastic bone lesion. Osseous structures are age appropriate. IMPRESSION: 1. No acute intra-abdominal pathology is identified. 2. Normal appearance of the  left lower quadrant transplant kidney. No hydronephrosis. Aortic Atherosclerosis (ICD10-I70.0). Electronically Signed   By: Worthy Heads M.D.   On: 03/14/2024 23:02   DG Chest Port 1 View Result Date: 03/14/2024 CLINICAL DATA:  Sepsis EXAM: PORTABLE CHEST 1 VIEW COMPARISON:  10/20/2019 FINDINGS: Right-sided volume loss, right pleural thickening, and rounded mass at the right costophrenic angle is again seen is in keeping with changes of rounded atelectasis noted prior CT examination of 05/02/2018. Left lung is clear. No pneumothorax. No pleural effusion on the left. Cardiac size within normal limits. Pulmonary vascularity is normal. No acute bone abnormality. IMPRESSION: 1. No radiographic evidence of acute cardiopulmonary disease. 2. Right-sided volume loss, right pleural thickening, and rounded mass at the right costophrenic angle in keeping with changes of rounded atelectasis noted on prior CT examination of 05/02/2018. Electronically Signed   By: Worthy Heads M.D.   On: 03/14/2024 21:15      Consults: Case discussed with ***.   Reassessment and Plan:   Physical exam clinical history demonstrates a likely sepsis due to presence of tachycardia, tachypnea, and fever.  Further noted elevated lactate on lab workup at 2.2, AKI noted with GFR at 40 and creatinine elevated to 1.91.  Urine workup is heavily indicative of a urinary tract infection with large leukocytes, positive nitrites, proteinuria and ketonuria.  CBC does not demonstrate a leukocytosis at this time however have begun antibiotics of ceftriaxone to manage likely sepsis secondary to urinary tract infection.  Further concern for coagulopathy induced by infection as his INR is 1.4 with previous INR being within normal limits.  As a result of presenting likely urosepsis, will consult with hospitalist team for admission for continued antibiotic therapy and monitoring of his condition.  This was discussed thoroughly with the patient and his  wife and they both agree and understand that this is necessary.    {Document critical care time when appropriate:1} {Document review of labs and clinical decision tools ie heart score, Chads2Vasc2 etc:1}  {Document your independent review of radiology images, and any outside records:1} {Document your discussion with family members, caretakers, and with consultants:1} {Document social determinants of health affecting pt's care:1} {Document your decision making why or why not admission, treatments were needed:1} Final Clinical Impression(s) / ED Diagnoses Final diagnoses:  Sepsis with acute renal failure without septic shock, due to unspecified organism, unspecified acute renal failure type (HCC)  Acute cystitis without hematuria    Rx / DC Orders ED Discharge Orders     None

## 2024-03-14 NOTE — Sepsis Progress Note (Signed)
 Elink monitoring for the code sepsis protocol.

## 2024-03-14 NOTE — ED Notes (Signed)
 Patient to CT.

## 2024-03-14 NOTE — ED Notes (Signed)
 Patient is being discharged from the Urgent Care and sent to the Emergency Department via POV . Per [place]  Harlow Lighter, FNP, patient is in need of higher level of care due to s/p kidney transplant, abnormal urinalysis, chills. Patient is aware and verbalizes understanding of plan of care.  Vitals:   03/14/24 1854  BP: 126/80  Pulse: 68  Resp: 18  Temp: 98.4 F (36.9 C)

## 2024-03-14 NOTE — ED Triage Notes (Signed)
 Pt states that he has urine frequency with little output, fever, abdominal pain, fatigue X 3 days. Pt states he had a kidney transplant and was advised by [facility] to be seen at [facility].

## 2024-03-14 NOTE — ED Provider Notes (Signed)
 Patient presents to clinic over concern of urinary frequency, fever, abdominal pain and fatigue for the past 3 days.  Has also had diminished urine output.  History of renal transplant x 2, followed by [facility].  Staff having difficulty obtaining oxygenation, extremities are cold.  Patient is shaking in clinic.  Discussed due to extensive medical history and urinalysis results, that patient would benefit from further advanced evaluation at the nearest emergency department.  Patient and wife agreeable to plan, will head to [facility], ED via POV.   Harlow Lighter, Terrence Wishon  N, FNP 03/14/24 1910

## 2024-03-14 NOTE — ED Triage Notes (Addendum)
 Says he presented to UC earlier today for dysuria, frequency, suprapubic pains, flank pain, fever (101) Tuesday this week, chills and generalized not feeling well.   Hx bilateral kidney transplant at [facility].

## 2024-03-15 ENCOUNTER — Encounter (HOSPITAL_COMMUNITY): Payer: Self-pay | Admitting: Family Medicine

## 2024-03-15 DIAGNOSIS — D696 Thrombocytopenia, unspecified: Secondary | ICD-10-CM | POA: Diagnosis present

## 2024-03-15 DIAGNOSIS — G4733 Obstructive sleep apnea (adult) (pediatric): Secondary | ICD-10-CM | POA: Diagnosis present

## 2024-03-15 DIAGNOSIS — Z833 Family history of diabetes mellitus: Secondary | ICD-10-CM | POA: Diagnosis not present

## 2024-03-15 DIAGNOSIS — Z7982 Long term (current) use of aspirin: Secondary | ICD-10-CM | POA: Diagnosis not present

## 2024-03-15 DIAGNOSIS — A419 Sepsis, unspecified organism: Secondary | ICD-10-CM | POA: Diagnosis present

## 2024-03-15 DIAGNOSIS — B9620 Unspecified Escherichia coli [E. coli] as the cause of diseases classified elsewhere: Secondary | ICD-10-CM | POA: Diagnosis present

## 2024-03-15 DIAGNOSIS — J4489 Other specified chronic obstructive pulmonary disease: Secondary | ICD-10-CM | POA: Diagnosis present

## 2024-03-15 DIAGNOSIS — I5032 Chronic diastolic (congestive) heart failure: Secondary | ICD-10-CM | POA: Diagnosis present

## 2024-03-15 DIAGNOSIS — N179 Acute kidney failure, unspecified: Secondary | ICD-10-CM | POA: Diagnosis present

## 2024-03-15 DIAGNOSIS — T8619 Other complication of kidney transplant: Secondary | ICD-10-CM | POA: Diagnosis present

## 2024-03-15 DIAGNOSIS — Z7984 Long term (current) use of oral hypoglycemic drugs: Secondary | ICD-10-CM | POA: Diagnosis not present

## 2024-03-15 DIAGNOSIS — Z8249 Family history of ischemic heart disease and other diseases of the circulatory system: Secondary | ICD-10-CM | POA: Diagnosis not present

## 2024-03-15 DIAGNOSIS — Y830 Surgical operation with transplant of whole organ as the cause of abnormal reaction of the patient, or of later complication, without mention of misadventure at the time of the procedure: Secondary | ICD-10-CM | POA: Diagnosis present

## 2024-03-15 DIAGNOSIS — Z888 Allergy status to other drugs, medicaments and biological substances status: Secondary | ICD-10-CM | POA: Diagnosis not present

## 2024-03-15 DIAGNOSIS — N390 Urinary tract infection, site not specified: Secondary | ICD-10-CM | POA: Diagnosis present

## 2024-03-15 DIAGNOSIS — N3000 Acute cystitis without hematuria: Secondary | ICD-10-CM | POA: Diagnosis present

## 2024-03-15 DIAGNOSIS — N186 End stage renal disease: Secondary | ICD-10-CM | POA: Diagnosis present

## 2024-03-15 DIAGNOSIS — E785 Hyperlipidemia, unspecified: Secondary | ICD-10-CM | POA: Diagnosis present

## 2024-03-15 DIAGNOSIS — I132 Hypertensive heart and chronic kidney disease with heart failure and with stage 5 chronic kidney disease, or end stage renal disease: Secondary | ICD-10-CM | POA: Diagnosis present

## 2024-03-15 DIAGNOSIS — Z79899 Other long term (current) drug therapy: Secondary | ICD-10-CM | POA: Diagnosis not present

## 2024-03-15 DIAGNOSIS — R6520 Severe sepsis without septic shock: Secondary | ICD-10-CM | POA: Diagnosis present

## 2024-03-15 DIAGNOSIS — Z1152 Encounter for screening for COVID-19: Secondary | ICD-10-CM | POA: Diagnosis not present

## 2024-03-15 DIAGNOSIS — N400 Enlarged prostate without lower urinary tract symptoms: Secondary | ICD-10-CM | POA: Diagnosis present

## 2024-03-15 DIAGNOSIS — Z825 Family history of asthma and other chronic lower respiratory diseases: Secondary | ICD-10-CM | POA: Diagnosis not present

## 2024-03-15 LAB — LACTIC ACID, PLASMA: Lactic Acid, Venous: 1.1 mmol/L (ref 0.5–1.9)

## 2024-03-15 LAB — BASIC METABOLIC PANEL WITH GFR
Anion gap: 14 (ref 5–15)
BUN: 24 mg/dL — ABNORMAL HIGH (ref 6–20)
CO2: 16 mmol/L — ABNORMAL LOW (ref 22–32)
Calcium: 9.7 mg/dL (ref 8.9–10.3)
Chloride: 99 mmol/L (ref 98–111)
Creatinine, Ser: 1.69 mg/dL — ABNORMAL HIGH (ref 0.61–1.24)
GFR, Estimated: 47 mL/min — ABNORMAL LOW (ref >60)
Glucose, Bld: 111 mg/dL — ABNORMAL HIGH (ref 70–99)
Potassium: 4.1 mmol/L (ref 3.5–5.1)
Sodium: 129 mmol/L — ABNORMAL LOW (ref 135–145)

## 2024-03-15 LAB — HEPATIC FUNCTION PANEL
ALT: 28 U/L (ref 0–44)
AST: 33 U/L (ref 15–41)
Albumin: 3.2 g/dL — ABNORMAL LOW (ref 3.5–5.0)
Alkaline Phosphatase: 56 U/L (ref 38–126)
Bilirubin, Direct: 0.4 mg/dL — ABNORMAL HIGH (ref 0.0–0.2)
Indirect Bilirubin: 1.3 mg/dL — ABNORMAL HIGH (ref 0.3–0.9)
Total Bilirubin: 1.7 mg/dL — ABNORMAL HIGH (ref 0.0–1.2)
Total Protein: 7.2 g/dL (ref 6.5–8.1)

## 2024-03-15 LAB — HIV ANTIBODY (ROUTINE TESTING W REFLEX): HIV Screen 4th Generation wRfx: NONREACTIVE

## 2024-03-15 LAB — CBC
HCT: 42.8 % (ref 39.0–52.0)
Hemoglobin: 13.9 g/dL (ref 13.0–17.0)
MCH: 27.6 pg (ref 26.0–34.0)
MCHC: 32.5 g/dL (ref 30.0–36.0)
MCV: 84.9 fL (ref 80.0–100.0)
Platelets: 78 10*3/uL — ABNORMAL LOW (ref 150–400)
RBC: 5.04 MIL/uL (ref 4.22–5.81)
RDW: 15.9 % — ABNORMAL HIGH (ref 11.5–15.5)
WBC: 7.4 10*3/uL (ref 4.0–10.5)
nRBC: 0 % (ref 0.0–0.2)

## 2024-03-15 MED ORDER — AMLODIPINE BESYLATE 5 MG PO TABS
5.0000 mg | ORAL_TABLET | Freq: Every day | ORAL | Status: DC
  Filled 2024-03-15: qty 1

## 2024-03-15 MED ORDER — SODIUM CHLORIDE 0.9% FLUSH
3.0000 mL | Freq: Two times a day (BID) | INTRAVENOUS | Status: DC
  Administered 2024-03-15 – 2024-03-17 (×5): 3 mL via INTRAVENOUS

## 2024-03-15 MED ORDER — HYDROCORTISONE SOD SUC (PF) 100 MG IJ SOLR
100.0000 mg | Freq: Two times a day (BID) | INTRAMUSCULAR | Status: AC
  Administered 2024-03-15: 100 mg via INTRAVENOUS
  Filled 2024-03-15: qty 2

## 2024-03-15 MED ORDER — IPRATROPIUM-ALBUTEROL 0.5-2.5 (3) MG/3ML IN SOLN: 3.0000 mL | Freq: Four times a day (QID) | RESPIRATORY_TRACT | Status: DC | PRN

## 2024-03-15 MED ORDER — ONDANSETRON HCL 4 MG/2ML IJ SOLN: 4.0000 mg | Freq: Four times a day (QID) | INTRAMUSCULAR | Status: DC | PRN

## 2024-03-15 MED ORDER — SODIUM CHLORIDE 0.9 % IV SOLN: INTRAVENOUS | Status: DC

## 2024-03-15 MED ORDER — ACETAMINOPHEN 325 MG PO TABS
650.0000 mg | ORAL_TABLET | Freq: Four times a day (QID) | ORAL | Status: DC | PRN
  Administered 2024-03-16 – 2024-03-17 (×2): 650 mg via ORAL
  Filled 2024-03-15 (×2): qty 2

## 2024-03-15 MED ORDER — ACETAMINOPHEN 650 MG RE SUPP: 650.0000 mg | Freq: Four times a day (QID) | RECTAL | Status: DC | PRN

## 2024-03-15 MED ORDER — TAMSULOSIN HCL 0.4 MG PO CAPS
0.4000 mg | ORAL_CAPSULE | Freq: Every day | ORAL | Status: DC
  Administered 2024-03-15 – 2024-03-17 (×3): 0.4 mg via ORAL
  Filled 2024-03-15 (×3): qty 1

## 2024-03-15 MED ORDER — SODIUM CHLORIDE 0.9 % IV SOLN
2.0000 g | INTRAVENOUS | Status: DC
  Administered 2024-03-15 – 2024-03-16 (×2): 2 g via INTRAVENOUS
  Filled 2024-03-15 (×2): qty 20

## 2024-03-15 MED ORDER — SODIUM BICARBONATE 650 MG PO TABS
650.0000 mg | ORAL_TABLET | Freq: Once | ORAL | Status: AC
  Administered 2024-03-15: 650 mg via ORAL
  Filled 2024-03-15: qty 1

## 2024-03-15 MED ORDER — TACROLIMUS ER 1 MG PO TB24
6.0000 mg | ORAL_TABLET | Freq: Every day | ORAL | Status: DC
  Administered 2024-03-15 – 2024-03-17 (×3): 6 mg via ORAL
  Filled 2024-03-15 (×4): qty 6

## 2024-03-15 MED ORDER — PREDNISONE 10 MG PO TABS
10.0000 mg | ORAL_TABLET | Freq: Two times a day (BID) | ORAL | Status: DC
  Administered 2024-03-16 – 2024-03-17 (×3): 10 mg via ORAL
  Filled 2024-03-15 (×4): qty 1

## 2024-03-15 MED ORDER — OXYCODONE HCL 5 MG PO TABS: 5.0000 mg | ORAL_TABLET | Freq: Four times a day (QID) | ORAL | Status: DC | PRN

## 2024-03-15 MED ORDER — CARVEDILOL 25 MG PO TABS
25.0000 mg | ORAL_TABLET | Freq: Two times a day (BID) | ORAL | Status: DC
  Administered 2024-03-15 – 2024-03-17 (×5): 25 mg via ORAL
  Filled 2024-03-15 (×5): qty 1

## 2024-03-15 MED ORDER — HYDROCORTISONE SOD SUC (PF) 100 MG IJ SOLR
100.0000 mg | Freq: Two times a day (BID) | INTRAMUSCULAR | Status: DC
  Administered 2024-03-15: 100 mg via INTRAVENOUS
  Filled 2024-03-15 (×2): qty 2

## 2024-03-15 MED ORDER — ONDANSETRON HCL 4 MG PO TABS: 4.0000 mg | ORAL_TABLET | Freq: Four times a day (QID) | ORAL | Status: DC | PRN

## 2024-03-15 MED ORDER — HYDROMORPHONE HCL 1 MG/ML IJ SOLN: 0.5000 mg | INTRAMUSCULAR | Status: DC | PRN

## 2024-03-15 NOTE — Progress Notes (Signed)
 Psychosocial Progressive/Outcome: ANOx4, calm and cooperative  Wife at bedside   Pain/Comfort Progression/Outcome: Pt did not complain of pain during shift   Clinical Progression/Outcome: Adequate fluid and diet intake  Independent in positioning Voids to BR as needed  No BM Slept in between care Maintained safety

## 2024-03-15 NOTE — Progress Notes (Signed)
 [facility] Progress Note Patient: Jonathon Conley ZOX:[id number] DOB: 11/05/1965 DOA: 03/14/2024  DOS: the patient was seen and examined on 03/15/2024  Brief Hospital Course: PMH of renal transplant, BK virus, OSA on BiPAP, HTN, chronic HFpEF.  Presented to the hospital with complaints of abdominal pain fever chills and burning urination. Found to have a UTI with sepsis. Treated with IV fluid and IV antibiotic.  Assessment and Plan: Severe sepsis secondary to UTI. Presents with complaints of fever and chills.  Dysuria. Met SIRS criteria with tachycardia tachypnea.  Evidence of infection in the urine.  Organ damage with AKI and hyperbilirubinemia. Currently being treated with IV ceftriaxone . Also receiving IV fluid. Monitor response.  AKI. Baseline renal function appears to be around normal. Currently serum creatinine 1.9 on admission. CT scanning does not show any evidence of acute abnormality.  Transplant kidney appears to be normal. Will provide IV hydration. Tacrolimus  level has been sent out. Monitor.  History of renal transplant. On tacrolimus  and prednisone . Not on any antimetabolite given history of BK virus. Received stress dose steroids.  Now that the blood pressure is improved I will start tapering it to prednisone .  History of hypertension. Patient is on amlodipine  and carvedilol . Currently holding amlodipine  to allow room for blood pressure in an event of sepsis. Continue carvedilol .  History of BPH. On Flomax. Continue.  HLD. On statin.  OSA. On BiPAP. Continue.  Acute on chronic thrombocytopenia. Chronically patient's platelet count are in 100. Currently platelet count has dropped down to 70s. Likely this will drop further given need for IV hydration with dilution. Although concern for gram-negative sepsis causing thrombocytopenia due to marrow suppression. Monitor.  Subjective: Feeling better.  No nausea no vomiting.  No fever no chills.  Fatigue  and tiredness still present.  Physical Exam: General: in Mild distress, No Rash Cardiovascular: S1 and S2 Present, No Murmur Respiratory: Good respiratory effort, Bilateral Air entry present. No Crackles, No wheezes Abdomen: Bowel Sound present, No tenderness Extremities: No edema Neuro: Alert and oriented x3, no new focal deficit  Data Reviewed: I have Reviewed nursing notes, Vitals, and Lab results. Since last encounter, pertinent lab results CBC and BMP   . I have ordered test including CBC and BMP  .   Disposition: Status is: Inpatient Remains inpatient appropriate because: Monitor for improvement in renal function and culture clearance  SCDs Start: 03/15/24 0018   Family Communication: Family at bedside Level of care: Telemetry Medical   Vitals:   03/15/24 0724 03/15/24 0819 03/15/24 1155 03/15/24 1634  BP:  (!) 130/92 (!) 132/92 114/85  Pulse:  (!) 103 96 98  Resp:  19 18 18   Temp: 97.8 F (36.6 C) (!) 97.5 F (36.4 C) (!) 97.5 F (36.4 C) 98.3 F (36.8 C)  TempSrc: Oral     SpO2:  100% 98% 98%  Weight:      Height:         Author: Charlean Congress, MD 03/15/2024 5:42 PM  Please look on [email or web address] to find out who is on call.

## 2024-03-15 NOTE — ED Notes (Signed)
 Patient moved to room 2 report given

## 2024-03-15 NOTE — H&P (Addendum)
 History and Physical    Jonathon Conley GMW:[id number] DOB: 1966-07-07 DOA: 03/14/2024  PCP: Jegede, Olugbemiga E, MD   Patient coming from: Home   Chief Complaint: Lower abdominal pain, dysuria, fever, chills, fatigue   HPI: Jonathon Conley is a 58 y.o. male with medical history significant for hypertension, chronic HFpEF, OSA on BiPAP, and ESRD s/p renal transplantation who presents with lower abdominal pain, fatigue, dysuria, fever, and chills.  Patient began to feel generally poor the night of 03/11/2024.  Since then he has had lower abdominal pain, dysuria, fever, chills, and fatigue.  He denies nausea or vomiting but states that he has not eaten anything in the past 2 days.  He denies chest pain.  He was feeling short of breath with exertion earlier but denies any respiratory symptoms currently.  ED Course: Upon arrival to the ED, patient is found to have Tmax of 37.9 C measured orally with transient tachypnea and tachycardia, and stable BP.  Labs are most notable for creatinine 1.91, platelets 91,000, and lactate 2.4.  There are no acute findings on CT of the abdomen pelvis, or on chest x-ray.  Urinalysis is concerning for infection.  Blood and urine cultures were collected and the patient was given 3 L LR, DuoNeb, and 2 g IV Rocephin .  Review of Systems:  All other systems reviewed and apart from HPI, are negative.  Past Medical History:  Diagnosis Date   Anemia    Asthma    childhood   Blood transfusion without reported diagnosis    CHF (congestive heart failure) (HCC)    Chronic diastolic heart failure (HCC) 02/28/2020   Chronic kidney disease    Dialysis Mon, Wed, Fri   Colitis    COPD (chronic obstructive pulmonary disease) (HCC)    Diverticulosis 04/12/16   also seen: sigmoid and rectal erythema, path:    Dyspnea    Heart murmur    Hemorrhoids 03/2016   bleeding at 04/12/16 colonoscopy, treated with  APC laser. cauterization   Hyperlipidemia    Hypertension    OSA  (obstructive sleep apnea) 03/27/2013    Past Surgical History:  Procedure Laterality Date   A/V SHUNTOGRAM Right 04/17/2019   Procedure: A/V SHUNTOGRAM;  Surgeon: Celso College, MD;  Location: [facility];  Service: Cardiovascular;  Laterality: Right;   APPLICATION OF WOUND VAC Right 12/13/2016   Procedure: APPLICATION OF WOUND VAC;  Surgeon: Celso College, MD;  Location: [facility];  Service: Vascular;  Laterality: Right;   APPLICATION OF WOUND VAC Right 12/19/2016   Procedure: APPLICATION OF WOUND VAC;  Surgeon: Celso College, MD;  Location: [facility];  Service: Vascular;  Laterality: Right;   COLONOSCOPY N/A 04/12/2016   Procedure: COLONOSCOPY;  Surgeon: Sergio Dandy, MD;  Location: [facility];  Service: Endoscopy;  Laterality: N/A;   DIALYSIS/PERMA CATHETER INSERTION N/A 02/26/2017   Procedure: Dialysis/Perma Catheter Insertion;  Surgeon: Celso College, MD;  Location: [facility];  Service: Cardiovascular;  Laterality: N/A;   DIALYSIS/PERMA CATHETER INSERTION N/A 03/26/2018   Procedure: DIALYSIS/PERMA CATHETER INSERTION;  Surgeon: Jackquelyn Mass, MD;  Location: [facility];  Service: Cardiovascular;  Laterality: N/A;   DIALYSIS/PERMA CATHETER REMOVAL N/A 01/16/2019   Procedure: DIALYSIS/PERMA CATHETER REMOVAL;  Surgeon: Celso College, MD;  Location: [facility];  Service: Cardiovascular;  Laterality: N/A;   EXCHANGE OF A DIALYSIS CATHETER  11/20/2016   Procedure: Exchange Of A Dialysis Catheter;  Surgeon: Donald Frost  Vonna Guardian, MD;  Location: [facility];  Service: Cardiovascular;;   fistulagrams Right    HEMATOMA EVACUATION Right 12/13/2016   Procedure: EVACUATION HEMATOMA;  Surgeon: Celso College, MD;  Location: [facility];  Service: Vascular;  Laterality: Right;   HEMATOMA EVACUATION Right 12/19/2016   Procedure: EVACUATIONof seroma;  Surgeon: Celso College, MD;  Location: [facility];  Service: Vascular;  Laterality: Right;   HOT HEMOSTASIS N/A 04/12/2016   Procedure:  HOT HEMOSTASIS (ARGON PLASMA COAGULATION/BICAP);  Surgeon: Kavitha V Nandigam, MD;  Location: [facility];  Service: Endoscopy;  Laterality: N/A;   KIDNEY TRANSPLANT     LIGATIONS OF HERO GRAFT Right 04/25/2018   Procedure: LIGATIONS OF HERO GRAFT ( REVISION );  Surgeon: Celso College, MD;  Location: [facility];  Service: Vascular;  Laterality: Right;   PERIPHERAL VASCULAR CATHETERIZATION Bilateral 10/02/2016   Procedure: Upper Extremity Venography;  Surgeon: Celso College, MD;  Location: [facility];  Service: Cardiovascular;  Laterality: Bilateral;   PERIPHERAL VASCULAR CATHETERIZATION Right 11/08/2016   Procedure: DIALYSIS/PERMA CATHETER REMOVAL right Jugular;  Surgeon: Celso College, MD;  Location: [facility];  Service: Vascular;  Laterality: Right;   PERIPHERAL VASCULAR CATHETERIZATION Left 11/08/2016   Procedure: DIALYSIS/PERMA CATHETER INSERTION left jugular with u/s guide and flouroscan;  Surgeon: Celso College, MD;  Location: [facility];  Service: Vascular;  Laterality: Left;   PERIPHERAL VASCULAR CATHETERIZATION N/A 11/08/2016   Procedure: IVC FILTER INSERTION;  Surgeon: Celso College, MD;  Location: [facility];  Service: Vascular;  Laterality: N/A;   perm a cath in Rt upper chest Right    THORACENTESIS     UPPER EXTREMITY ANGIOGRAPHY Right 11/28/2018   Procedure: UPPER EXTREMITY ANGIOGRAPHY;  Surgeon: Celso College, MD;  Location: [facility];  Service: Cardiovascular;  Laterality: Right;   UPPER EXTREMITY VENOGRAPHY Bilateral 11/27/2019   Procedure: UPPER EXTREMITY VENOGRAPHY;  Surgeon: Celso College, MD;  Location: [facility];  Service: Cardiovascular;  Laterality: Bilateral;   VASCULAR ACCESS DEVICE INSERTION Right 11/08/2016   Procedure: INSERTION OF HERO VASCULAR ACCESS DEVICE ( GRAFT );  Surgeon: Celso College, MD;  Location: [facility];  Service: Vascular;  Laterality: Right;    Social History:   reports that he has never smoked. He has never used smokeless tobacco. He reports  that he does not drink alcohol and does not use drugs.  Allergies  Allergen Reactions   Lisinopril  Swelling    Facial swelling    Family History  Problem Relation Age of Onset   Heart disease Mother    Cancer Mother        ovarian   CAD Mother    Cancer Maternal Grandmother    Heart attack Maternal Grandmother    Lung cancer Maternal Grandmother    Cancer Maternal Grandfather    Heart attack Maternal Grandfather    Lung cancer Maternal Grandfather    Asthma Son    Asthma Son    Kidney failure Maternal Aunt    Diabetes Maternal Aunt      Prior to Admission medications   Medication Sig Start Date End Date Taking? Authorizing Provider  albuterol  (VENTOLIN  HFA) 108 (90 Base) MCG/ACT inhaler Inhale into the lungs.    [provider]  amLODipine  (NORVASC ) 5 MG tablet Take 1 tablet (5 mg total) by mouth in the morning and at bedtime. 01/22/20   [place], Peter M, MD  aspirin  EC 81 MG tablet Take 81 mg  by mouth daily.     [provider]  carvedilol  (COREG ) 25 MG tablet Take 25 mg by mouth 2 (two) times daily.     [provider]  Cholecalciferol (D 1000) 25 MCG (1000 UT) capsule Take 1,000 Units by mouth daily. 08/27/23   [provider]  hydrALAZINE  (APRESOLINE ) 50 MG tablet Take 50 mg by mouth 3 (three) times daily. 09/09/19   [provider]  JARDIANCE 10 MG TABS tablet  05/25/23   [provider]  losartan  (COZAAR ) 100 MG tablet Take 100 mg by mouth daily. 09/09/19   [provider]  predniSONE  (DELTASONE ) 5 MG tablet Take 5 mg by mouth daily.    [provider]  rosuvastatin (CRESTOR) 5 MG tablet Take 5 mg by mouth at bedtime.    [provider]  senna-docusate (SENOKOT-S) 8.6-50 MG tablet Take by mouth. 02/27/22   [provider]  tacrolimus  ER (ENVARSUS  XR) 1 MG TB24 Take 6 mg by mouth. 02/27/22 11/27/24  [provider]  tamsulosin (FLOMAX) 0.4 MG CAPS capsule Take 0.4 mg by mouth.  02/27/22   [provider]  trimethoprim -polymyxin b  (POLYTRIM ) ophthalmic solution Place 1 drop into the right eye every 6 (six) hours. 01/06/24   Yevette Hem, FNP    Physical Exam: Vitals:   03/14/24 2115 03/14/24 2130 03/14/24 2145 03/14/24 2200  BP: (!) 141/96 (!) 138/92 138/89 (!) 138/97  Pulse: (!) 111 (!) 109 (!) 110 (!) 105  Resp: 15 (!) 21 (!) 21 18  Temp:      TempSrc:      SpO2: 100% 100% 100% 100%  Weight:      Height:        Constitutional: NAD, calm  Eyes: PERTLA, lids and conjunctivae normal ENMT: Mucous membranes are dry and cracking. Posterior pharynx clear of any exudate or lesions.   Neck: supple, no masses  Respiratory: no wheezing, no crackles. No accessory muscle use.  Cardiovascular: S1 & S2 heard, regular rate and rhythm. No JVD. Abdomen: No tenderness, soft. Bowel sounds active.  Musculoskeletal: no clubbing / cyanosis. No joint deformity upper and lower extremities.   Skin: no significant rashes, lesions, ulcers. Warm, dry, well-perfused. Neurologic: CN 2-12 grossly intact. Moving all extremities. Alert and oriented.  Psychiatric: Pleasant. Cooperative.    Labs and Imaging on Admission: I have personally reviewed following labs and imaging studies  CBC: Recent Labs  Lab 03/14/24 2005  WBC 8.5  NEUTROABS 7.6  HGB 14.6  HCT 44.8  MCV 84.7  PLT 91*   Basic Metabolic Panel: Recent Labs  Lab 03/14/24 2005  NA 132*  K 4.3  CL 96*  CO2 18*  GLUCOSE 135*  BUN 25*  CREATININE 1.91*  CALCIUM  10.1   GFR: Estimated Creatinine Clearance: 50.8 mL/min (A) (by C-G formula based on SCr of 1.91 mg/dL (H)). Liver Function Tests: Recent Labs  Lab 03/14/24 2005  AST 36  ALT 29  ALKPHOS 59  BILITOT 1.9*  PROT 7.7  ALBUMIN 3.7   No results for input(s): "LIPASE", "AMYLASE" in the last 168 hours. No results for input(s): "AMMONIA" in the last 168 hours. Coagulation Profile: Recent Labs  Lab 03/14/24 2005  INR 1.4*   Cardiac  Enzymes: No results for input(s): "CKTOTAL", "CKMB", "CKMBINDEX", "TROPONINI" in the last 168 hours. BNP (last 3 results) No results for input(s): "PROBNP" in the last 8760 hours. HbA1C: No results for input(s): "HGBA1C" in the last 72 hours. CBG: No results for input(s): "  GLUCAP" in the last 168 hours. Lipid Profile: No results for input(s): "CHOL", "HDL", "LDLCALC", "TRIG", "CHOLHDL", "LDLDIRECT" in the last 72 hours. Thyroid Function Tests: No results for input(s): "TSH", "T4TOTAL", "FREET4", "T3FREE", "THYROIDAB" in the last 72 hours. Anemia Panel: No results for input(s): "VITAMINB12", "FOLATE", "FERRITIN", "TIBC", "IRON", "RETICCTPCT" in the last 72 hours. Urine analysis:    Component Value Date/Time   COLORURINE AMBER (A) 03/14/2024 1940   APPEARANCEUR TURBID (A) 03/14/2024 1940   LABSPEC 1.017 03/14/2024 1940   PHURINE 5.0 03/14/2024 1940   GLUCOSEU >=500 (A) 03/14/2024 1940   HGBUR LARGE (A) 03/14/2024 1940   BILIRUBINUR NEGATIVE 03/14/2024 1940   BILIRUBINUR negative 03/14/2024 1856   KETONESUR 20 (A) 03/14/2024 1940   KETONESUR large (80) (A) 03/14/2024 1856   PROTEINUR 100 (A) 03/14/2024 1940   PROTEINUR >=300 (A) 03/14/2024 1856   UROBILINOGEN 0.2 03/14/2024 1856   UROBILINOGEN 0.2 02/14/2015 2213   NITRITE POSITIVE (A) 03/14/2024 1940   NITRITE Positive (A) 03/14/2024 1856   LEUKOCYTESUR LARGE (A) 03/14/2024 1940   LEUKOCYTESUR Small (1+) (A) 03/14/2024 1856   Sepsis Labs: @LABRCNTIP (procalcitonin:4,lacticidven:4) ) Recent Results (from the past 240 hours)  Resp panel by RT-PCR (RSV, Flu A&B, Covid) Anterior Nasal Swab     Status: None   Collection Time: 03/14/24  7:46 PM   Specimen: Anterior Nasal Swab  Result Value Ref Range Status   SARS Coronavirus 2 by RT PCR NEGATIVE NEGATIVE Final   Influenza A by PCR NEGATIVE NEGATIVE Final   Influenza B by PCR NEGATIVE NEGATIVE Final    Comment: (NOTE) The Xpert Xpress SARS-CoV-2/FLU/RSV plus assay is intended as  an aid in the diagnosis of influenza from Nasopharyngeal swab specimens and should not be used as a sole basis for treatment. Nasal washings and aspirates are unacceptable for Xpert Xpress SARS-CoV-2/FLU/RSV testing.  Fact Sheet for Patients: BloggerCourse.com  Fact Sheet for Healthcare Providers: SeriousBroker.it  This test is not yet approved or cleared by the [place]  FDA and has been authorized for detection and/or diagnosis of SARS-CoV-2 by FDA under an Emergency Use Authorization (EUA). This EUA will remain in effect (meaning this test can be used) for the duration of the COVID-19 declaration under Section 564(b)(1) of the Act, 21 U.S.C. section 360bbb-3(b)(1), unless the authorization is terminated or revoked.     Resp Syncytial Virus by PCR NEGATIVE NEGATIVE Final    Comment: (NOTE) Fact Sheet for Patients: BloggerCourse.com  Fact Sheet for Healthcare Providers: SeriousBroker.it  This test is not yet approved or cleared by the [place]  FDA and has been authorized for detection and/or diagnosis of SARS-CoV-2 by FDA under an Emergency Use Authorization (EUA). This EUA will remain in effect (meaning this test can be used) for the duration of the COVID-19 declaration under Section 564(b)(1) of the Act, 21 U.S.C. section 360bbb-3(b)(1), unless the authorization is terminated or revoked.  Performed at [facility], [address], [place], [place] [address]   Blood Culture (routine x 2)     Status: None (Preliminary result)   Collection Time: 03/14/24  8:05 PM   Specimen: BLOOD RIGHT HAND  Result Value Ref Range Status   Specimen Description BLOOD RIGHT HAND  Final   Special Requests   Final    BOTTLES DRAWN AEROBIC ONLY Blood Culture results may not be optimal due to an inadequate volume of blood received in culture bottles Performed at [facility], [address] [place], [place] [address]  Culture PENDING  Incomplete   Report Status PENDING  Incomplete     Radiological Exams on Admission: CT ABDOMEN PELVIS W CONTRAST Result Date: 03/14/2024 CLINICAL DATA:  Fever, pyelonephritis EXAM: CT ABDOMEN AND PELVIS WITH CONTRAST TECHNIQUE: Multidetector CT imaging of the abdomen and pelvis was performed using the standard protocol following bolus administration of intravenous contrast. RADIATION DOSE REDUCTION: This exam was performed according to the departmental dose-optimization program which includes automated exposure control, adjustment of the mA and/or kV according to patient size and/or use of iterative reconstruction technique. CONTRAST:  75mL OMNIPAQUE  IOHEXOL  350 MG/ML SOLN COMPARISON:  03/07/2022 FINDINGS: Lower chest: Stable right basilar volume loss with pleural thickening, calcification, abnormalities atelectasis, incompletely visualized. No acute abnormality. Hepatobiliary: No focal liver abnormality is seen. No gallstones, gallbladder wall thickening, or biliary dilatation. Pancreas: Unremarkable Spleen: Unremarkable Adrenals/Urinary Tract: The adrenal glands are unremarkable. Severe atrophy of the native kidneys bilaterally as well as a right lower quadrant transplant kidney. Left lower quadrant transplant kidney demonstrates normal size and cortical thickness. Normal cortical enhancement of the left transplant kidney. No hydronephrosis. No perinephric fluid collections. No intrarenal or ureteral calculi. Bladder unremarkable. Stomach/Bowel: Multiple probable small duodenal diverticula involving the third portion of the duodenum. Stomach, small bowel, and large bowel are otherwise unremarkable. No evidence of obstruction or focal inflammation. Appendix normal. Vascular/Lymphatic: Right small bore common iliac venous stent is in place. Mild aortoiliac atherosclerotic calcification. Left femoral-distal bypass grafting is partially  visualized. No pathologic adenopathy within the abdomen and pelvis. Reproductive: Mild prostatic hypertrophy. Other: Small fat containing umbilical and bilateral inguinal hernias are present. Numerous vascular collaterals are seen within the numerous small venous vascular collaterals are seen the subcutaneous soft tissues of the anterior abdominal wall, unchanged. Small fat containing umbilical and bilateral inguinal hernias are present. Musculoskeletal: No acute bone abnormality. No lytic or blastic bone lesion. Osseous structures are age appropriate. IMPRESSION: 1. No acute intra-abdominal pathology is identified. 2. Normal appearance of the left lower quadrant transplant kidney. No hydronephrosis. Aortic Atherosclerosis (ICD10-I70.0). Electronically Signed   By: Worthy Heads M.D.   On: 03/14/2024 23:02   DG Chest Port 1 View Result Date: 03/14/2024 CLINICAL DATA:  Sepsis EXAM: PORTABLE CHEST 1 VIEW COMPARISON:  10/20/2019 FINDINGS: Right-sided volume loss, right pleural thickening, and rounded mass at the right costophrenic angle is again seen is in keeping with changes of rounded atelectasis noted prior CT examination of 05/02/2018. Left lung is clear. No pneumothorax. No pleural effusion on the left. Cardiac size within normal limits. Pulmonary vascularity is normal. No acute bone abnormality. IMPRESSION: 1. No radiographic evidence of acute cardiopulmonary disease. 2. Right-sided volume loss, right pleural thickening, and rounded mass at the right costophrenic angle in keeping with changes of rounded atelectasis noted on prior CT examination of 05/02/2018. Electronically Signed   By: Worthy Heads M.D.   On: 03/14/2024 21:15    EKG: Independently reviewed. Sinus tachycardia, rate 114.   Assessment/Plan  1. Sepsis secondary to UTI - Continue empiric antibiotics, trend lactate, follow cultures and clinical response to treatment    2. AKI, renal transplant recipient  - SCr is 1.91, up from 1.3 in  February 2025    - Transplant kidney appears normal on CT; this is likely prerenal given his recent anorexia   - Check urine chemistries, continue IVF hydration, renally-dose medications, check tacrolimus  level, increase steroid dose for now in setting of acute illness, repeat serum chemistries in am   3. Thrombocytopenia  - Platelets 91,000  on admission, down from 104k in February 2025, possibly from tacrolimus  and/or sepsis   - Check tacrolimus  level, treat sepsis, repeat CBC in am   4. Hypertension  - Continue Norvasc  and Coreg  as tolerated    5. OSA  - BiPAP while sleeping    DVT prophylaxis: SCDs  Code Status: Full  Level of Care: Level of care: Telemetry Medical Family Communication: None present   Disposition Plan:  Patient is from: home  Anticipated d/c is to: Home  Anticipated d/c date is: 03/17/24  Patient currently: Pending treatment of sepsis d/t UTI  Consults called: None  Admission status: Inpatient     Walton Guppy, MD [facility]  03/15/2024, 12:20 AM

## 2024-03-16 ENCOUNTER — Encounter (HOSPITAL_COMMUNITY): Payer: Self-pay | Admitting: Family Medicine

## 2024-03-16 DIAGNOSIS — N390 Urinary tract infection, site not specified: Secondary | ICD-10-CM | POA: Diagnosis not present

## 2024-03-16 DIAGNOSIS — A419 Sepsis, unspecified organism: Secondary | ICD-10-CM | POA: Diagnosis not present

## 2024-03-16 LAB — CBC
HCT: 40.7 % (ref 39.0–52.0)
Hemoglobin: 13.5 g/dL (ref 13.0–17.0)
MCH: 27.6 pg (ref 26.0–34.0)
MCHC: 33.2 g/dL (ref 30.0–36.0)
MCV: 83.1 fL (ref 80.0–100.0)
Platelets: 81 10*3/uL — ABNORMAL LOW (ref 150–400)
RBC: 4.9 MIL/uL (ref 4.22–5.81)
RDW: 15.9 % — ABNORMAL HIGH (ref 11.5–15.5)
WBC: 6.4 10*3/uL (ref 4.0–10.5)
nRBC: 0 % (ref 0.0–0.2)

## 2024-03-16 LAB — BASIC METABOLIC PANEL WITH GFR
Anion gap: 8 (ref 5–15)
BUN: 33 mg/dL — ABNORMAL HIGH (ref 6–20)
CO2: 20 mmol/L — ABNORMAL LOW (ref 22–32)
Calcium: 9.8 mg/dL (ref 8.9–10.3)
Chloride: 107 mmol/L (ref 98–111)
Creatinine, Ser: 1.56 mg/dL — ABNORMAL HIGH (ref 0.61–1.24)
GFR, Estimated: 51 mL/min — ABNORMAL LOW (ref >60)
Glucose, Bld: 164 mg/dL — ABNORMAL HIGH (ref 70–99)
Potassium: 3.4 mmol/L — ABNORMAL LOW (ref 3.5–5.1)
Sodium: 135 mmol/L (ref 135–145)

## 2024-03-16 LAB — URINE CULTURE: Culture: >=100000 — AB

## 2024-03-16 LAB — TACROLIMUS LEVEL: Tacrolimus (FK506) - LabCorp: 4.3 ng/mL — ABNORMAL LOW (ref 5.0–20.0)

## 2024-03-16 MED ORDER — SODIUM CHLORIDE 0.9 % IV SOLN: INTRAVENOUS | Status: DC

## 2024-03-16 MED ORDER — POTASSIUM CHLORIDE CRYS ER 20 MEQ PO TBCR
40.0000 meq | EXTENDED_RELEASE_TABLET | Freq: Once | ORAL | Status: AC
  Administered 2024-03-16: 40 meq via ORAL
  Filled 2024-03-16: qty 2

## 2024-03-16 NOTE — Progress Notes (Signed)
 [facility] Progress Note Patient: Jonathon Conley ZOX:[id number] DOB: 02-20-1966 DOA: 03/14/2024  DOS: the patient was seen and examined on 03/16/2024  Brief Hospital Course: PMH of renal transplant, BK virus, OSA on BiPAP, HTN, chronic HFpEF.  Presented to the hospital with complaints of abdominal pain fever chills and burning urination. Found to have a UTI with sepsis. Treated with IV fluid and IV antibiotic.   Assessment and Plan: Severe sepsis secondary to UTI.  Due to E. coli Presents with complaints of fever and chills.  Dysuria. Met SIRS criteria with tachycardia tachypnea.  Evidence of infection in the urine.  Organ damage with AKI and hyperbilirubinemia. Currently being treated with IV ceftriaxone .  Urine culture growing E. coli.  Will continue with antibiotics for now switch to oral tomorrow. Also continue IV fluid. Monitor response.   AKI. Baseline renal function appears to be around normal. Currently serum creatinine 1.9 on admission. CT scanning does not show any evidence of acute abnormality.  Transplant kidney appears to be normal. Will continue IV hydration. Tacrolimus  level has been sent out. Monitor.   History of renal transplant. On tacrolimus  and prednisone . Not on any antimetabolite given history of BK virus. Received stress dose steroids.  Now that the blood pressure is improved I will start tapering it to prednisone .   History of hypertension. Patient is on amlodipine  and carvedilol . Currently holding amlodipine  to allow room for blood pressure in an event of sepsis. Continue carvedilol .   History of BPH. On Flomax. Continue.   HLD. On statin.   OSA. On BiPAP. Continue.   Acute on chronic thrombocytopenia. Chronically patient's platelet count are in 100. Currently platelet count has dropped down to 70s. Likely this will drop further given need for IV hydration with dilution. Although concern for gram-negative sepsis causing thrombocytopenia  due to marrow suppression. Monitor.   Subjective: No nausea, vomiting no fever no chills.  Feeling better.  Still fatigued.  Physical Exam: General: in Mild distress, No Rash Cardiovascular: S1 and S2 Present, No Murmur Respiratory: Good respiratory effort, Bilateral Air entry present. No Crackles, No wheezes Abdomen: Bowel Sound present, No tenderness Extremities: No edema Neuro: Alert and oriented x3, no new focal deficit  Data Reviewed: I have Reviewed nursing notes, Vitals, and Lab results. Since last encounter, pertinent lab results CBC and BMP   . I have ordered test including CBC and BMP  .   Disposition: Status is: Inpatient Remains inpatient appropriate because: Monitor for improvement in renal function  SCDs Start: 03/15/24 0018   Family Communication: No 1 at bedside Level of care: Telemetry Medical   Vitals:   03/15/24 1946 03/16/24 0111 03/16/24 0529 03/16/24 1212  BP: 124/88 (!) 121/96 (!) 135/90 (!) 135/95  Pulse: 99 95 93 85  Resp: 18 18 18    Temp: 97.6 F (36.4 C) 97.6 F (36.4 C) 97.7 F (36.5 C)   TempSrc: Oral Oral Oral   SpO2: 98% 97% 97% 97%  Weight:      Height:         Author: Charlean Congress, MD 03/16/2024 4:30 PM  Please look on [email or web address] to find out who is on call.

## 2024-03-16 NOTE — Progress Notes (Signed)
 Psychosocial Progressive/Outcome: ANOx4, calm and cooperative  Wife at bedside   Pain/Comfort Progression/Outcome: Pt did not complain of pain during shift   Clinical Progression/Outcome: Adequate fluid and diet intake  Independent in positioning Voids to BR as needed  No BM Slept in between care Maintained safety

## 2024-03-16 NOTE — Plan of Care (Signed)

## 2024-03-16 NOTE — Hospital Course (Signed)
 PMH of renal transplant, BK virus, OSA on BiPAP, HTN, chronic HFpEF.  Presented to the hospital with complaints of abdominal pain fever chills and burning urination. Found to have a UTI with sepsis. Treated with IV fluid and IV antibiotic.   Assessment and Plan: Severe sepsis secondary to UTI.  Due to E. coli Presents with complaints of fever and chills.  Dysuria. Met SIRS criteria with tachycardia tachypnea.  Evidence of infection in the urine.  Organ damage with AKI and hyperbilirubinemia. Currently being treated with IV ceftriaxone .  Urine culture growing E. coli.  Will continue with antibiotics for now switch to oral tomorrow. Also continue IV fluid. Monitor response.   AKI. Baseline renal function appears to be around normal. Currently serum creatinine 1.9 on admission. CT scanning does not show any evidence of acute abnormality.  Transplant kidney appears to be normal. Will continue IV hydration. Tacrolimus  level has been sent out. Monitor.   History of renal transplant. On tacrolimus  and prednisone . Not on any antimetabolite given history of BK virus. Received stress dose steroids.  Now that the blood pressure is improved I will start tapering it to prednisone .   History of hypertension. Patient is on amlodipine  and carvedilol . Currently holding amlodipine  to allow room for blood pressure in an event of sepsis. Continue carvedilol .   History of BPH. On Flomax. Continue.   HLD. On statin.   OSA. On BiPAP. Continue.   Acute on chronic thrombocytopenia. Chronically patient's platelet count are in 100. Currently platelet count has dropped down to 70s. Likely this will drop further given need for IV hydration with dilution. Although concern for gram-negative sepsis causing thrombocytopenia due to marrow suppression. Monitor.

## 2024-03-17 DIAGNOSIS — N390 Urinary tract infection, site not specified: Secondary | ICD-10-CM | POA: Diagnosis not present

## 2024-03-17 DIAGNOSIS — A419 Sepsis, unspecified organism: Secondary | ICD-10-CM | POA: Diagnosis not present

## 2024-03-17 LAB — BASIC METABOLIC PANEL WITH GFR
Anion gap: 11 (ref 5–15)
BUN: 24 mg/dL — ABNORMAL HIGH (ref 6–20)
CO2: 18 mmol/L — ABNORMAL LOW (ref 22–32)
Calcium: 9.7 mg/dL (ref 8.9–10.3)
Chloride: 106 mmol/L (ref 98–111)
Creatinine, Ser: 1.34 mg/dL — ABNORMAL HIGH (ref 0.61–1.24)
GFR, Estimated: >60 mL/min (ref >60)
Glucose, Bld: 149 mg/dL — ABNORMAL HIGH (ref 70–99)
Potassium: 3.9 mmol/L (ref 3.5–5.1)
Sodium: 135 mmol/L (ref 135–145)

## 2024-03-17 LAB — CBC
HCT: 40.3 % (ref 39.0–52.0)
Hemoglobin: 12.8 g/dL — ABNORMAL LOW (ref 13.0–17.0)
MCH: 27.5 pg (ref 26.0–34.0)
MCHC: 31.8 g/dL (ref 30.0–36.0)
MCV: 86.5 fL (ref 80.0–100.0)
Platelets: 100 10*3/uL — ABNORMAL LOW (ref 150–400)
RBC: 4.66 MIL/uL (ref 4.22–5.81)
RDW: 16.2 % — ABNORMAL HIGH (ref 11.5–15.5)
WBC: 4.1 10*3/uL (ref 4.0–10.5)
nRBC: 0 % (ref 0.0–0.2)

## 2024-03-17 MED ORDER — CEFADROXIL 500 MG PO CAPS: 500.0000 mg | ORAL_CAPSULE | Freq: Two times a day (BID) | ORAL | 0 refills | Status: AC

## 2024-03-17 MED ORDER — CEFADROXIL 500 MG PO CAPS
500.0000 mg | ORAL_CAPSULE | Freq: Two times a day (BID) | ORAL | Status: DC
  Administered 2024-03-17: 500 mg via ORAL
  Filled 2024-03-17: qty 1

## 2024-03-17 NOTE — Plan of Care (Signed)

## 2024-03-17 NOTE — Progress Notes (Signed)
 Discharge nurse went over paperwork with pt, all questions were answered. Pt left with all belongings and paperwork

## 2024-03-17 NOTE — Discharge Summary (Signed)
 Physician Discharge Summary   Patient: Jonathon Conley MRN: [id number] DOB: [birth year]  Admit date:     03/14/2024  Discharge date: 03/17/24  Discharge Physician: Charlean Congress  PCP: Jegede, Olugbemiga E, MD  Recommendations at discharge: Follow-up with PCP in 1 week. Follow-up with nephrology as recommended.  Repeat BMP in 1 week.  Discharge Diagnoses: Principal Problem:   Sepsis secondary to UTI [facility]) Active Problems:   OSA (obstructive sleep apnea)   AKI (acute kidney injury) (HCC)   Renal transplant recipient   Thrombocytopenia (HCC)   HTN (hypertension)  Hospital Course: PMH of renal transplant, BK virus, OSA on BiPAP, HTN, chronic HFpEF.  Presented to the hospital with complaints of abdominal pain fever chills and burning urination. Found to have a UTI with sepsis. Treated with IV fluid and IV antibiotic.   Assessment and Plan: Severe sepsis secondary to UTI.  Due to E. coli Presents with complaints of fever and chills.  Dysuria. Met SIRS criteria with tachycardia tachypnea.  Evidence of infection in the urine.  Organ damage with AKI and hyperbilirubinemia. Currently being treated with IV ceftriaxone .  Urine culture growing E. coli.   Switch to oral.  Tolerated well.  Outpatient follow-up with PCP/nephrology in 1 week recommended with repeat BMP.   AKI. Baseline renal function appears to be around normal. Currently serum creatinine 1.9 on admission. CT scanning does not show any evidence of acute abnormality.  Transplant kidney appears to be normal. Tacrolimus  level has been sent out. Treated with IV hydration Monitor outpatient   History of renal transplant. On tacrolimus  and prednisone . Not on any antimetabolite given history of BK virus. Received stress dose steroids. Will discharge on home dose of prednisone .   History of hypertension. Patient is on amlodipine  and carvedilol .   History of BPH. On Flomax. Continue.   HLD. On statin.   OSA. On  BiPAP. Continue.   Acute on chronic thrombocytopenia. Chronically patient's platelet count are in 100. Currently platelet count has dropped down to 70s. concern for gram-negative sepsis causing thrombocytopenia due to marrow suppression. Now improving.  Consultants:  None  Procedures performed:  None  DISCHARGE MEDICATION: Allergies as of 03/17/2024       Reactions   Lisinopril  Swelling   Facial swelling        Medication List     TAKE these medications    albuterol  108 (90 Base) MCG/ACT inhaler Commonly known as: VENTOLIN  HFA Inhale 2 puffs into the lungs every 6 (six) hours as needed for wheezing or shortness of breath.   amLODipine  10 MG tablet Commonly known as: NORVASC  Take 10 mg by mouth daily.   aspirin  EC 81 MG tablet Take 81 mg by mouth daily.   carvedilol  25 MG tablet Commonly known as: COREG  Take 25 mg by mouth 2 (two) times daily.   cefadroxil 500 MG capsule Commonly known as: DURICEF Take 1 capsule (500 mg total) by mouth 2 (two) times daily for 5 days.   D 1000 25 MCG (1000 UT) capsule Generic drug: Cholecalciferol Take 1,000 Units by mouth daily.   empagliflozin 10 MG Tabs tablet Commonly known as: JARDIANCE Take 10 mg by mouth daily.   predniSONE  5 MG tablet Commonly known as: DELTASONE  Take 5 mg by mouth daily.   rosuvastatin 5 MG tablet Commonly known as: CRESTOR Take 5 mg by mouth at bedtime.   senna-docusate 8.6-50 MG tablet Commonly known as: Senokot-S Take 1 tablet by mouth daily.   tacrolimus  ER 1 MG Tb24  Commonly known as: ENVARSUS  XR Take 6 mg by mouth.   tamsulosin 0.4 MG Caps capsule Commonly known as: FLOMAX Take 0.4 mg by mouth.       Disposition: Home Diet recommendation: Regular diet  Discharge Exam: Vitals:   03/16/24 1632 03/16/24 2105 03/17/24 0341 03/17/24 0724  BP: (!) 141/93 132/88 (!) 159/99 (!) 143/95  Pulse: 88 84 87 89  Resp: 19 18 18    Temp: 98 F (36.7 C) 97.8 F (36.6 C) 98.1 F (36.7  C) 97.9 F (36.6 C)  TempSrc: Oral Oral Oral Oral  SpO2: 99% 99% 99% 94%  Weight:      Height:       General: Appear in mild distress; no visible Abnormal Neck Mass Or lumps, Conjunctiva normal Cardiovascular: S1 and S2 Present, no Murmur, Respiratory: good respiratory effort, Bilateral Air entry present and CTA, no Crackles, no wheezes Abdomen: Bowel Sound present, Non tender  Extremities: no Pedal edema Neurology: alert and oriented to time, place, and person  [facility] Weights   03/14/24 1933  Weight: 97.5 kg   Condition at discharge: stable  The results of significant diagnostics from this hospitalization (including imaging, microbiology, ancillary and laboratory) are listed below for reference.   Imaging Studies: CT ABDOMEN PELVIS W CONTRAST Result Date: 03/14/2024 CLINICAL DATA:  Fever, pyelonephritis EXAM: CT ABDOMEN AND PELVIS WITH CONTRAST TECHNIQUE: Multidetector CT imaging of the abdomen and pelvis was performed using the standard protocol following bolus administration of intravenous contrast. RADIATION DOSE REDUCTION: This exam was performed according to the departmental dose-optimization program which includes automated exposure control, adjustment of the mA and/or kV according to patient size and/or use of iterative reconstruction technique. CONTRAST:  75mL OMNIPAQUE  IOHEXOL  350 MG/ML SOLN COMPARISON:  03/07/2022 FINDINGS: Lower chest: Stable right basilar volume loss with pleural thickening, calcification, abnormalities atelectasis, incompletely visualized. No acute abnormality. Hepatobiliary: No focal liver abnormality is seen. No gallstones, gallbladder wall thickening, or biliary dilatation. Pancreas: Unremarkable Spleen: Unremarkable Adrenals/Urinary Tract: The adrenal glands are unremarkable. Severe atrophy of the native kidneys bilaterally as well as a right lower quadrant transplant kidney. Left lower quadrant transplant kidney demonstrates normal size and cortical  thickness. Normal cortical enhancement of the left transplant kidney. No hydronephrosis. No perinephric fluid collections. No intrarenal or ureteral calculi. Bladder unremarkable. Stomach/Bowel: Multiple probable small duodenal diverticula involving the third portion of the duodenum. Stomach, small bowel, and large bowel are otherwise unremarkable. No evidence of obstruction or focal inflammation. Appendix normal. Vascular/Lymphatic: Right small bore common iliac venous stent is in place. Mild aortoiliac atherosclerotic calcification. Left femoral-distal bypass grafting is partially visualized. No pathologic adenopathy within the abdomen and pelvis. Reproductive: Mild prostatic hypertrophy. Other: Small fat containing umbilical and bilateral inguinal hernias are present. Numerous vascular collaterals are seen within the numerous small venous vascular collaterals are seen the subcutaneous soft tissues of the anterior abdominal wall, unchanged. Small fat containing umbilical and bilateral inguinal hernias are present. Musculoskeletal: No acute bone abnormality. No lytic or blastic bone lesion. Osseous structures are age appropriate. IMPRESSION: 1. No acute intra-abdominal pathology is identified. 2. Normal appearance of the left lower quadrant transplant kidney. No hydronephrosis. Aortic Atherosclerosis (ICD10-I70.0). Electronically Signed   By: Worthy Heads M.D.   On: 03/14/2024 23:02   DG Chest Port 1 View Result Date: 03/14/2024 CLINICAL DATA:  Sepsis EXAM: PORTABLE CHEST 1 VIEW COMPARISON:  10/20/2019 FINDINGS: Right-sided volume loss, right pleural thickening, and rounded mass at the right costophrenic angle is again seen is in keeping  with changes of rounded atelectasis noted prior CT examination of 05/02/2018. Left lung is clear. No pneumothorax. No pleural effusion on the left. Cardiac size within normal limits. Pulmonary vascularity is normal. No acute bone abnormality. IMPRESSION: 1. No radiographic  evidence of acute cardiopulmonary disease. 2. Right-sided volume loss, right pleural thickening, and rounded mass at the right costophrenic angle in keeping with changes of rounded atelectasis noted on prior CT examination of 05/02/2018. Electronically Signed   By: Worthy Heads M.D.   On: 03/14/2024 21:15    Microbiology: Results for orders placed or performed during the hospital encounter of 03/14/24  Urine Culture     Status: Abnormal   Collection Time: 03/14/24  7:40 PM   Specimen: Urine, Clean Catch  Result Value Ref Range Status   Specimen Description URINE, CLEAN CATCH  Final   Special Requests   Final    NONE Performed at [facility], [address] [place], [place] [address]    Culture >=100,000 COLONIES/mL ESCHERICHIA COLI (A)  Final   Report Status 03/16/2024 FINAL  Final   Organism ID, Bacteria ESCHERICHIA COLI (A)  Final      Susceptibility   Escherichia coli - MIC*    AMPICILLIN >=32 RESISTANT Resistant     CEFAZOLIN  <=4 SENSITIVE Sensitive     CEFEPIME <=0.12 SENSITIVE Sensitive     CEFTRIAXONE  <=0.25 SENSITIVE Sensitive     CIPROFLOXACIN <=0.25 SENSITIVE Sensitive     GENTAMICIN <=1 SENSITIVE Sensitive     IMIPENEM <=0.25 SENSITIVE Sensitive     NITROFURANTOIN <=16 SENSITIVE Sensitive     TRIMETH/SULFA >=320 RESISTANT Resistant     AMPICILLIN/SULBACTAM 16 INTERMEDIATE Intermediate     PIP/TAZO <=4 SENSITIVE Sensitive ug/mL    * >=100,000 COLONIES/mL ESCHERICHIA COLI  Resp panel by RT-PCR (RSV, Flu A&B, Covid) Anterior Nasal Swab     Status: None   Collection Time: 03/14/24  7:46 PM   Specimen: Anterior Nasal Swab  Result Value Ref Range Status   SARS Coronavirus 2 by RT PCR NEGATIVE NEGATIVE Final   Influenza A by PCR NEGATIVE NEGATIVE Final   Influenza B by PCR NEGATIVE NEGATIVE Final    Comment: (NOTE) The Xpert Xpress SARS-CoV-2/FLU/RSV plus assay is intended as an aid in the diagnosis of influenza from Nasopharyngeal swab specimens and should not  be used as a sole basis for treatment. Nasal washings and aspirates are unacceptable for Xpert Xpress SARS-CoV-2/FLU/RSV testing.  Fact Sheet for Patients: BloggerCourse.com  Fact Sheet for Healthcare Providers: SeriousBroker.it  This test is not yet approved or cleared by the [place]  FDA and has been authorized for detection and/or diagnosis of SARS-CoV-2 by FDA under an Emergency Use Authorization (EUA). This EUA will remain in effect (meaning this test can be used) for the duration of the COVID-19 declaration under Section 564(b)(1) of the Act, 21 U.S.C. section 360bbb-3(b)(1), unless the authorization is terminated or revoked.     Resp Syncytial Virus by PCR NEGATIVE NEGATIVE Final    Comment: (NOTE) Fact Sheet for Patients: BloggerCourse.com  Fact Sheet for Healthcare Providers: SeriousBroker.it  This test is not yet approved or cleared by the [place]  FDA and has been authorized for detection and/or diagnosis of SARS-CoV-2 by FDA under an Emergency Use Authorization (EUA). This EUA will remain in effect (meaning this test can be used) for the duration of the COVID-19 declaration under Section 564(b)(1) of the Act, 21 U.S.C. section 360bbb-3(b)(1), unless the authorization is terminated or revoked.  Performed at  [facility], [address], [place], [place] [address]   Blood Culture (routine x 2)     Status: None (Preliminary result)   Collection Time: 03/14/24  8:05 PM   Specimen: BLOOD LEFT ARM  Result Value Ref Range Status   Specimen Description BLOOD LEFT ARM  Final   Special Requests   Final    BOTTLES DRAWN AEROBIC AND ANAEROBIC Blood Culture results may not be optimal due to an inadequate volume of blood received in culture bottles   Culture   Final    NO GROWTH 3 DAYS Performed at [facility], [address], [place], [place]  [address]    Report Status PENDING  Incomplete  Blood Culture (routine x 2)     Status: None (Preliminary result)   Collection Time: 03/14/24  8:05 PM   Specimen: BLOOD RIGHT HAND  Result Value Ref Range Status   Specimen Description BLOOD RIGHT HAND  Final   Special Requests   Final    BOTTLES DRAWN AEROBIC ONLY Blood Culture results may not be optimal due to an inadequate volume of blood received in culture bottles   Culture   Final    NO GROWTH 3 DAYS Performed at [facility], [address], [place], [place] [address]    Report Status PENDING  Incomplete   Labs: CBC: Recent Labs  Lab 03/14/24 2005 03/15/24 0606 03/16/24 0545 03/17/24 0602  WBC 8.5 7.4 6.4 4.1  NEUTROABS 7.6  --   --   --   HGB 14.6 13.9 13.5 12.8*  HCT 44.8 42.8 40.7 40.3  MCV 84.7 84.9 83.1 86.5  PLT 91* 78* 81* 100*   Basic Metabolic Panel: Recent Labs  Lab 03/14/24 2005 03/15/24 0606 03/16/24 0545 03/17/24 0602  NA 132* 129* 135 135  K 4.3 4.1 3.4* 3.9  CL 96* 99 107 106  CO2 18* 16* 20* 18*  GLUCOSE 135* 111* 164* 149*  BUN 25* 24* 33* 24*  CREATININE 1.91* 1.69* 1.56* 1.34*  CALCIUM  10.1 9.7 9.8 9.7   Liver Function Tests: Recent Labs  Lab 03/14/24 2005 03/15/24 0606  AST 36 33  ALT 29 28  ALKPHOS 59 56  BILITOT 1.9* 1.7*  PROT 7.7 7.2  ALBUMIN 3.7 3.2*   CBG: No results for input(s): "GLUCAP" in the last 168 hours.  Discharge time spent: greater than 30 minutes.  Author: Charlean Congress, MD  [facility] 03/17/2024

## 2024-03-18 ENCOUNTER — Encounter: Payer: Self-pay | Admitting: Cardiology

## 2024-03-18 ENCOUNTER — Telehealth: Payer: Self-pay | Admitting: *Deleted

## 2024-03-18 NOTE — Transitions of Care (Post Inpatient/ED Visit) (Signed)
   03/18/2024  Name: BASSEL GASKILL MRN: [id number] DOB: 10/26/1965  Today's TOC FU Call Status: Today's TOC FU Call Status:: Unsuccessful Call (1st Attempt) Unsuccessful Call (1st Attempt) Date: 03/18/24  Attempted to reach the patient regarding the most recent Inpatient/ED visit.  Follow Up Plan: Additional outreach attempts will be made to reach the patient to complete the Transitions of Care (Post Inpatient/ED visit) call.   Arna Better RN, BSN [place]  [facility] Health RN Care Manager [phone]

## 2024-03-19 ENCOUNTER — Telehealth: Payer: Self-pay | Admitting: *Deleted

## 2024-03-19 LAB — CULTURE, BLOOD (ROUTINE X 2)
Culture: NO GROWTH
Culture: NO GROWTH

## 2024-03-19 NOTE — Transitions of Care (Post Inpatient/ED Visit) (Signed)
   03/19/2024  Name: Jonathon Conley MRN: [id number] DOB: [birth year]  Today's TOC FU Call Status: Today's TOC FU Call Status:: Unsuccessful Call (2nd Attempt) Unsuccessful Call (2nd Attempt) Date: 03/19/24  Attempted to reach the patient regarding the most recent Inpatient/ED visit.  Follow Up Plan: Additional outreach attempts will be made to reach the patient to complete the Transitions of Care (Post Inpatient/ED visit) call.   Arna Better RN, BSN   [facility] Health RN Care Manager [phone]

## 2024-03-21 ENCOUNTER — Telehealth: Payer: Self-pay | Admitting: *Deleted

## 2024-03-21 NOTE — Transitions of Care (Post Inpatient/ED Visit) (Signed)
   03/21/2024  Name: Jonathon Conley MRN: [id number] DOB: 1966/06/22  Today's TOC FU Call Status: Today's TOC FU Call Status:: Unsuccessful Call (1st Attempt) Unsuccessful Call (1st Attempt) Date: 03/21/24  Attempted to reach the patient regarding the most recent Inpatient/ED visit.  Follow Up Plan: Additional outreach attempts will be made to reach the patient to complete the Transitions of Care (Post Inpatient/ED visit) call.   Arna Better RN, BSN [place]  Value[facility] Institute [facility] Health RN Care Manager [phone]

## 2024-03-24 ENCOUNTER — Telehealth: Payer: Self-pay | Admitting: *Deleted

## 2024-03-24 NOTE — Transitions of Care (Post Inpatient/ED Visit) (Signed)
   03/24/2024  Name: Jonathon Conley MRN: [id number] DOB: [birth year]  Today's TOC FU Call Status: Today's TOC FU Call Status:: Unsuccessful Call (2nd Attempt) Unsuccessful Call (2nd Attempt) Date: 03/24/24  Attempted to reach the patient regarding the most recent Inpatient/ED visit.  Follow Up Plan: Additional outreach attempts will be made to reach the patient to complete the Transitions of Care (Post Inpatient/ED visit) call.   Arna Better RN, BSN [place]  Value-Based Care Institute [facility] Health RN Care Manager [phone]

## 2024-03-25 ENCOUNTER — Encounter: Payer: Self-pay | Admitting: *Deleted

## 2024-03-25 NOTE — Transitions of Care (Post Inpatient/ED Visit) (Unsigned)
   03/25/2024  Name: Jonathon Conley MRN: [id number] DOB: 09-20-1966  Today's TOC FU Call Status: Today's TOC FU Call Status:: Unsuccessful Call (3rd Attempt) Unsuccessful Call (3rd Attempt) Date: 03/25/24  Attempted to reach the patient regarding the most recent Inpatient/ED visit.  Follow Up Plan: No further outreach attempts will be made at this time. We have been unable to contact the patient.  Arna Better RN, BSN [facility]  [facility] Health RN Care Manager [phone]

## 2024-05-21 NOTE — Procedures (Signed)
 Cancelled.

## 2024-09-16 NOTE — Progress Notes (Signed)
 [facility] [employer] was not able to reach the patient by phone call.  The details of interventions are as follows:     09/16/2024    8:49 AM  Gap Closure  Age Group: Adult  Adult Yearly Physical Interventions Unable to reach-- reminder message sent  Comments: lvm    [facility]    [address]; [place], [place] [address] l  [email or web address] l [phone]   For more information on [facility] services, click here.

## 2024-09-26 NOTE — Telephone Encounter (Signed)
-----   Message from Lucious Micky Cumber, MD sent at 09/26/2024 12:52 PM EST ----- Great with the BK! Tac is a little high which could be the reason of the Cr  Can we decrease Envarsus  to 5mg  daily?  Noted pending labs.  Repeat in 1-2 weeks  Thanks, Goni ----- Message ----- From: Izell Silvius, RN Sent: 09/26/2024  10:18 AM EST To: Lucious Micky Cumber, MD  2.5 years Tac 9.1 on Envarsus  6 Pred 5  Cr 1.5 - up some for him Mag 1.5 No proteinuria UA w/ some leuks CBC ok CMV negative  BK 2880 from 23.8K AS, HLA pending, known CI DSA ----- Message ----- From: Lab, Background User Sent: 09/25/2024   9:39 AM EST To: Silvius Izell, RN

## 2024-09-26 NOTE — Telephone Encounter (Signed)
 Reviewed changes with the patient on the phone.  Dose dropped and repeat labs in a couple of weeks

## 2024-10-17 ENCOUNTER — Emergency Department (HOSPITAL_COMMUNITY)

## 2024-10-17 ENCOUNTER — Inpatient Hospital Stay (HOSPITAL_COMMUNITY)
Admission: EM | Admit: 2024-10-17 | Discharge: 2024-10-19 | DRG: 699 | Disposition: A | Discharge status: Home / Self Care | Attending: Internal Medicine | Admitting: Internal Medicine

## 2024-10-17 ENCOUNTER — Encounter (HOSPITAL_COMMUNITY): Payer: Self-pay | Admitting: Internal Medicine

## 2024-10-17 DIAGNOSIS — D849 Immunodeficiency, unspecified: Secondary | ICD-10-CM

## 2024-10-17 DIAGNOSIS — I5032 Chronic diastolic (congestive) heart failure: Secondary | ICD-10-CM | POA: Diagnosis present

## 2024-10-17 DIAGNOSIS — Z940 Kidney transplant status: Secondary | ICD-10-CM

## 2024-10-17 DIAGNOSIS — R509 Fever, unspecified: Secondary | ICD-10-CM | POA: Diagnosis not present

## 2024-10-17 DIAGNOSIS — E876 Hypokalemia: Secondary | ICD-10-CM | POA: Diagnosis present

## 2024-10-17 DIAGNOSIS — J4489 Other specified chronic obstructive pulmonary disease: Secondary | ICD-10-CM | POA: Diagnosis present

## 2024-10-17 DIAGNOSIS — Z7982 Long term (current) use of aspirin: Secondary | ICD-10-CM

## 2024-10-17 DIAGNOSIS — Z8249 Family history of ischemic heart disease and other diseases of the circulatory system: Secondary | ICD-10-CM

## 2024-10-17 DIAGNOSIS — D696 Thrombocytopenia, unspecified: Secondary | ICD-10-CM | POA: Diagnosis present

## 2024-10-17 DIAGNOSIS — E869 Volume depletion, unspecified: Secondary | ICD-10-CM | POA: Diagnosis present

## 2024-10-17 DIAGNOSIS — I110 Hypertensive heart disease with heart failure: Secondary | ICD-10-CM | POA: Diagnosis present

## 2024-10-17 DIAGNOSIS — J9811 Atelectasis: Secondary | ICD-10-CM | POA: Diagnosis present

## 2024-10-17 DIAGNOSIS — Z79899 Other long term (current) drug therapy: Secondary | ICD-10-CM

## 2024-10-17 DIAGNOSIS — Y830 Surgical operation with transplant of whole organ as the cause of abnormal reaction of the patient, or of later complication, without mention of misadventure at the time of the procedure: Secondary | ICD-10-CM | POA: Diagnosis present

## 2024-10-17 DIAGNOSIS — I10 Essential (primary) hypertension: Secondary | ICD-10-CM | POA: Diagnosis present

## 2024-10-17 DIAGNOSIS — Z1152 Encounter for screening for COVID-19: Secondary | ICD-10-CM

## 2024-10-17 DIAGNOSIS — E785 Hyperlipidemia, unspecified: Secondary | ICD-10-CM | POA: Diagnosis present

## 2024-10-17 DIAGNOSIS — Z801 Family history of malignant neoplasm of trachea, bronchus and lung: Secondary | ICD-10-CM

## 2024-10-17 DIAGNOSIS — T8619 Other complication of kidney transplant: Principal | ICD-10-CM | POA: Diagnosis present

## 2024-10-17 DIAGNOSIS — N400 Enlarged prostate without lower urinary tract symptoms: Secondary | ICD-10-CM | POA: Diagnosis present

## 2024-10-17 DIAGNOSIS — Z7984 Long term (current) use of oral hypoglycemic drugs: Secondary | ICD-10-CM

## 2024-10-17 DIAGNOSIS — R112 Nausea with vomiting, unspecified: Secondary | ICD-10-CM | POA: Diagnosis present

## 2024-10-17 DIAGNOSIS — Z833 Family history of diabetes mellitus: Secondary | ICD-10-CM

## 2024-10-17 DIAGNOSIS — G4733 Obstructive sleep apnea (adult) (pediatric): Secondary | ICD-10-CM | POA: Diagnosis present

## 2024-10-17 DIAGNOSIS — K529 Noninfective gastroenteritis and colitis, unspecified: Secondary | ICD-10-CM | POA: Insufficient documentation

## 2024-10-17 DIAGNOSIS — N179 Acute kidney failure, unspecified: Secondary | ICD-10-CM | POA: Diagnosis present

## 2024-10-17 DIAGNOSIS — K5900 Constipation, unspecified: Secondary | ICD-10-CM | POA: Diagnosis present

## 2024-10-17 DIAGNOSIS — R000 Tachycardia, unspecified: Secondary | ICD-10-CM | POA: Diagnosis present

## 2024-10-17 DIAGNOSIS — D84821 Immunodeficiency due to drugs: Secondary | ICD-10-CM | POA: Diagnosis present

## 2024-10-17 DIAGNOSIS — Z825 Family history of asthma and other chronic lower respiratory diseases: Secondary | ICD-10-CM

## 2024-10-17 DIAGNOSIS — Z7960 Long term (current) use of unspecified immunomodulators and immunosuppressants: Secondary | ICD-10-CM

## 2024-10-17 LAB — URINALYSIS, W/ REFLEX TO CULTURE (INFECTION SUSPECTED)
Bacteria, UA: NONE SEEN
Bilirubin Urine: NEGATIVE
Glucose, UA: >=500 mg/dL — AB
Ketones, ur: NEGATIVE mg/dL
Nitrite: NEGATIVE
Protein, ur: 30 mg/dL — AB
Specific Gravity, Urine: 1.017 (ref 1.005–1.030)
pH: 5 (ref 5.0–8.0)

## 2024-10-17 LAB — COMPREHENSIVE METABOLIC PANEL WITH GFR
ALT: 21 U/L (ref 0–44)
AST: 25 U/L (ref 15–41)
Albumin: 4.6 g/dL (ref 3.5–5.0)
Alkaline Phosphatase: 83 U/L (ref 38–126)
Anion gap: 14 (ref 5–15)
BUN: 29 mg/dL — ABNORMAL HIGH (ref 6–20)
CO2: 29 mmol/L (ref 22–32)
Calcium: 11.1 mg/dL — ABNORMAL HIGH (ref 8.9–10.3)
Chloride: 98 mmol/L (ref 98–111)
Creatinine, Ser: 1.85 mg/dL — ABNORMAL HIGH (ref 0.61–1.24)
GFR, Estimated: 42 mL/min — ABNORMAL LOW
Glucose, Bld: 185 mg/dL — ABNORMAL HIGH (ref 70–99)
Potassium: 2.9 mmol/L — ABNORMAL LOW (ref 3.5–5.1)
Sodium: 141 mmol/L (ref 135–145)
Total Bilirubin: 1 mg/dL (ref 0.0–1.2)
Total Protein: 8.6 g/dL — ABNORMAL HIGH (ref 6.5–8.1)

## 2024-10-17 LAB — CBC WITH DIFFERENTIAL/PLATELET
Abs Immature Granulocytes: 0.03 K/uL (ref 0.00–0.07)
Basophils Absolute: 0 K/uL (ref 0.0–0.1)
Basophils Relative: 0 %
Eosinophils Absolute: 0 K/uL (ref 0.0–0.5)
Eosinophils Relative: 0 %
HCT: 48.5 % (ref 39.0–52.0)
Hemoglobin: 15.6 g/dL (ref 13.0–17.0)
Immature Granulocytes: 1 %
Lymphocytes Relative: 2 %
Lymphs Abs: 0.1 K/uL — ABNORMAL LOW (ref 0.7–4.0)
MCH: 27.7 pg (ref 26.0–34.0)
MCHC: 32.2 g/dL (ref 30.0–36.0)
MCV: 86.1 fL (ref 80.0–100.0)
Monocytes Absolute: 0.2 K/uL (ref 0.1–1.0)
Monocytes Relative: 5 %
Neutro Abs: 3.5 K/uL (ref 1.7–7.7)
Neutrophils Relative %: 92 %
Platelets: 107 K/uL — ABNORMAL LOW (ref 150–400)
RBC: 5.63 MIL/uL (ref 4.22–5.81)
RDW: 15.4 % (ref 11.5–15.5)
WBC: 3.9 K/uL — ABNORMAL LOW (ref 4.0–10.5)
nRBC: 0 % (ref 0.0–0.2)

## 2024-10-17 LAB — RESP PANEL BY RT-PCR (RSV, FLU A&B, COVID)  RVPGX2
Influenza A by PCR: NEGATIVE
Influenza B by PCR: NEGATIVE
Resp Syncytial Virus by PCR: NEGATIVE
SARS Coronavirus 2 by RT PCR: NEGATIVE

## 2024-10-17 LAB — I-STAT CG4 LACTIC ACID, ED
Lactic Acid, Venous: 1.9 mmol/L (ref 0.5–1.9)
Lactic Acid, Venous: 1.3 mmol/L (ref 0.5–1.9)

## 2024-10-17 MED ORDER — TACROLIMUS ER 4 MG PO TB24
6.0000 mg | ORAL_TABLET | Freq: Once | ORAL | Status: AC
  Administered 2024-10-17: 6 mg via ORAL
  Filled 2024-10-17: qty 2

## 2024-10-17 MED ORDER — TACROLIMUS ER 4 MG PO TB24
6.0000 mg | ORAL_TABLET | Freq: Every day | ORAL | Status: DC
  Administered 2024-10-18 – 2024-10-19 (×2): 6 mg via ORAL
  Filled 2024-10-17 (×2): qty 2

## 2024-10-17 MED ORDER — CARVEDILOL 25 MG PO TABS
25.0000 mg | ORAL_TABLET | Freq: Two times a day (BID) | ORAL | Status: DC
  Administered 2024-10-18 – 2024-10-19 (×3): 25 mg via ORAL
  Filled 2024-10-17 (×3): qty 1

## 2024-10-17 MED ORDER — SODIUM CHLORIDE 0.9 % IV SOLN
2.0000 g | Freq: Once | INTRAVENOUS | Status: AC
  Administered 2024-10-17: 2 g via INTRAVENOUS
  Filled 2024-10-17: qty 12.5

## 2024-10-17 MED ORDER — POTASSIUM CHLORIDE 10 MEQ/100ML IV SOLN
10.0000 meq | Freq: Once | INTRAVENOUS | Status: AC
  Administered 2024-10-17: 10 meq via INTRAVENOUS
  Filled 2024-10-17: qty 100

## 2024-10-17 MED ORDER — ACETAMINOPHEN 650 MG RE SUPP: 650.0000 mg | Freq: Four times a day (QID) | RECTAL | Status: DC | PRN

## 2024-10-17 MED ORDER — TAMSULOSIN HCL 0.4 MG PO CAPS
0.4000 mg | ORAL_CAPSULE | Freq: Every day | ORAL | Status: DC
  Administered 2024-10-18 (×2): 0.4 mg via ORAL
  Filled 2024-10-17 (×2): qty 1

## 2024-10-17 MED ORDER — ROSUVASTATIN CALCIUM 5 MG PO TABS
5.0000 mg | ORAL_TABLET | Freq: Every day | ORAL | Status: DC
  Administered 2024-10-18: 5 mg via ORAL
  Filled 2024-10-17: qty 1

## 2024-10-17 MED ORDER — AMLODIPINE BESYLATE 10 MG PO TABS: 10.0000 mg | ORAL_TABLET | Freq: Every day | ORAL | Status: DC

## 2024-10-17 MED ORDER — ACETAMINOPHEN 325 MG PO TABS: 650.0000 mg | ORAL_TABLET | Freq: Four times a day (QID) | ORAL | Status: DC | PRN

## 2024-10-17 MED ORDER — PREDNISONE 5 MG PO TABS
5.0000 mg | ORAL_TABLET | Freq: Every day | ORAL | Status: DC
  Administered 2024-10-17 – 2024-10-19 (×3): 5 mg via ORAL
  Filled 2024-10-17 (×3): qty 1

## 2024-10-17 MED ORDER — ONDANSETRON HCL 4 MG/2ML IJ SOLN
4.0000 mg | Freq: Once | INTRAMUSCULAR | Status: AC
  Administered 2024-10-17: 4 mg via INTRAVENOUS
  Filled 2024-10-17: qty 2

## 2024-10-17 MED ORDER — POTASSIUM CHLORIDE IN NACL 20-0.9 MEQ/L-% IV SOLN
INTRAVENOUS | Status: DC
  Filled 2024-10-17 (×2): qty 1000

## 2024-10-17 MED ORDER — SODIUM CHLORIDE 0.9 % IV BOLUS
1000.0000 mL | Freq: Once | INTRAVENOUS | Status: AC
  Administered 2024-10-17: 1000 mL via INTRAVENOUS

## 2024-10-17 MED ORDER — AMLODIPINE BESYLATE 5 MG PO TABS
5.0000 mg | ORAL_TABLET | Freq: Every day | ORAL | Status: DC
  Administered 2024-10-18 – 2024-10-19 (×2): 5 mg via ORAL
  Filled 2024-10-17 (×2): qty 1

## 2024-10-17 MED ORDER — ACETAMINOPHEN 500 MG PO TABS
1000.0000 mg | ORAL_TABLET | Freq: Once | ORAL | Status: AC
  Administered 2024-10-17: 1000 mg via ORAL
  Filled 2024-10-17: qty 2

## 2024-10-17 MED ORDER — ONDANSETRON HCL 4 MG/2ML IJ SOLN
4.0000 mg | Freq: Four times a day (QID) | INTRAMUSCULAR | Status: DC | PRN
  Administered 2024-10-18: 4 mg via INTRAVENOUS
  Filled 2024-10-17: qty 2

## 2024-10-17 MED ORDER — SODIUM CHLORIDE 0.9 % IV SOLN
2.0000 g | Freq: Two times a day (BID) | INTRAVENOUS | Status: DC
  Administered 2024-10-18: 2 g via INTRAVENOUS
  Filled 2024-10-17: qty 12.5

## 2024-10-17 MED ORDER — HEPARIN SODIUM (PORCINE) 5000 UNIT/ML IJ SOLN
5000.0000 [IU] | Freq: Three times a day (TID) | INTRAMUSCULAR | Status: DC
  Administered 2024-10-18 – 2024-10-19 (×4): 5000 [IU] via SUBCUTANEOUS
  Filled 2024-10-17 (×4): qty 1

## 2024-10-17 NOTE — Progress Notes (Signed)
 ED Pharmacy Antibiotic Sign Off An antibiotic consult was received from an ED provider for cefepime per pharmacy dosing for UTI, kidney transplant. A chart review was completed to assess appropriateness.   The following one time order(s) were placed:  Cefepime 2g IV x1  Further antibiotic and/or antibiotic pharmacy consults should be ordered by the admitting provider if indicated.   Thank you for allowing pharmacy to be a part of this patient's care.   Wanda Hasting PharmD, BCPS [facility] main pharmacy [phone] 10/17/2024 4:16 PM

## 2024-10-17 NOTE — ED Provider Notes (Signed)
 " [place] [facility] AT [facility] Provider Note  CSN: [id number] Arrival date & time: 10/17/24 1519  Chief Complaint(s) Abdominal Pain  HPI ISAAH FURRY is a 59 y.o. male history of renal transplant follows at [facility] here today for fever, abdominal pain nausea and vomiting.  He says symptoms started this morning.  Patient was admitted to [facility] in May of this past year for E. coli UTI, broadly sensitive.   Past Medical History Past Medical History:  Diagnosis Date   Anemia    Asthma    childhood   Blood transfusion without reported diagnosis    CHF (congestive heart failure) (HCC)    Chronic diastolic heart failure (HCC) 02/28/2020   Chronic kidney disease    Dialysis Mon, Wed, Fri   Colitis    COPD (chronic obstructive pulmonary disease) (HCC)    Diverticulosis 04/12/16   also seen: sigmoid and rectal erythema, path:    Dyspnea    Heart murmur    Hemorrhoids 03/2016   bleeding at 04/12/16 colonoscopy, treated with  APC laser. 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   SIRS (systemic inflammatory response syndrome) (HCC) 12/19/2016   Hyponatremia 12/19/2016   Anemia of chronic disease 12/19/2016   Postprocedural seroma of a musculoskeletal structure following other procedure 12/17/2016   Secondary hyperparathyroidism of renal origin  10/15/2016   Abnormal results of liver function studies 04/27/2016   Hematochezia 04/08/2016   HTN (hypertension) 04/08/2016   Intravenous line infection 04/08/2016   Insomnia 01/04/2016   Malignant hypertension 12/14/2015   Pain, unspecified 12/14/2015   Symptomatic anemia 11/03/2015   Chronic anemia 11/03/2015   Thrombocytopenia 11/03/2015   Pleural effusion on right 11/03/2015   Shortness of breath 11/02/2015   Kidney transplant failure 10/17/2015   Sepsis (HCC) 09/23/2015   Accelerated hypertension 09/22/2015   Leg edema, left    HCAP (healthcare-associated pneumonia) 09/21/2015   Hypertensive chronic kidney disease with stage 5 chronic kidney disease or end stage renal disease (HCC) 09/15/2015   Moderate protein-calorie malnutrition 09/10/2015   Encounter for immunization 09/06/2015   Bacterial infection, unspecified 08/24/2015   Iron deficiency anemia, unspecified 08/24/2015   Bacteremia due to Gram-positive bacteria 08/20/2015   Acute blood loss anemia 08/12/2015   C. difficile colitis 08/12/2015   Bleeding gastrointestinal 08/12/2015   Acute-on-chronic renal failure 08/10/2015   H/O kidney transplant 08/08/2015   Non compliance with medical treatment 08/08/2015   History of anemia 08/08/2015   AKI (acute kidney injury) 02/14/2015   Renal transplant recipient 02/14/2015   Acute on chronic diastolic congestive heart failure (HCC) 02/14/2015   Anemia 02/14/2015   Hyperlipidemia    OSA (obstructive sleep apnea) 03/27/2013   Home Medication(s) Prior to Admission medications  Medication Sig Start Date End Date Taking? Authorizing Provider  albuterol  (VENTOLIN  HFA) 108 (90 Base) MCG/ACT inhaler Inhale 2 puffs into the lungs every  6 (six) hours as needed for wheezing or shortness of breath.    [provider]  amLODipine  (NORVASC ) 10 MG tablet Take 10 mg by mouth daily. 03/03/24   [provider]  aspirin  EC 81 MG tablet Take 81 mg by mouth daily.      [provider]  carvedilol  (COREG ) 25 MG tablet Take 25 mg by mouth 2 (two) times daily.     [provider]  Cholecalciferol (D 1000) 25 MCG (1000 UT) capsule Take 1,000 Units by mouth daily. 08/27/23   [provider]  empagliflozin (JARDIANCE) 10 MG TABS tablet Take 10 mg by mouth daily.    [provider]  predniSONE  (DELTASONE ) 5 MG tablet Take 5 mg by mouth daily.    [provider]  rosuvastatin (CRESTOR) 5 MG tablet Take 5 mg by mouth at bedtime.    [provider]  senna-docusate (SENOKOT-S) 8.6-50 MG tablet Take 1 tablet by mouth daily. 02/27/22   [provider]  tacrolimus  ER (ENVARSUS  XR) 1 MG TB24 Take 6 mg by mouth. 02/27/22 11/27/24  [provider]  tamsulosin  (FLOMAX ) 0.4 MG CAPS capsule Take 0.4 mg by mouth. 02/27/22   [provider]                                                                                                                                    Past Surgical History Past Surgical History:  Procedure Laterality Date   A/V SHUNTOGRAM Right 04/17/2019   Procedure: A/V SHUNTOGRAM;  Surgeon: Marea Selinda RAMAN, MD;  Location: [facility] INVASIVE CV LAB;  Service: Cardiovascular;  Laterality: Right;   APPLICATION OF WOUND VAC Right 12/13/2016   Procedure: APPLICATION OF WOUND VAC;  Surgeon: Selinda RAMAN Marea, MD;  Location: [facility] ORS;  Service: Vascular;  Laterality: Right;   APPLICATION OF WOUND VAC Right 12/19/2016   Procedure: APPLICATION OF WOUND VAC;  Surgeon: Selinda RAMAN Marea, MD;  Location: [facility] ORS;  Service: Vascular;  Laterality: Right;   COLONOSCOPY N/A 04/12/2016   Procedure: COLONOSCOPY;  Surgeon: Gustav LULLA Mcgee, MD;  Location: [facility] ENDOS[facility];  Service: Endoscopy;  Laterality: N/A;   DIALYSIS/PERMA CATHETER INSERTION N/A 02/26/2017   Procedure: Dialysis/Perma Catheter Insertion;  Surgeon: Marea Selinda RAMAN, MD;  Location: [facility] INV[facility] CV [facility];  Service: Cardiovascular;  Laterality: N/A;   DIALYSIS/PERMA  CATHETER INSERTION N/A 03/26/2018   Procedure: DIALYSIS/PERMA CATHETER INSERTION;  Surgeon: Jama Cordella MATSU, MD;  Location: [facility] INV[facility] CV LAB;  Service: Cardiovascular;  Laterality: N/A;   DIALYSIS/PERMA CATHETER REMOVAL N/A 01/16/2019   Procedure: DIALYSIS/PERMA CATHETER REMOVAL;  Surgeon: Marea Selinda RAMAN, MD;  Location: [facility] IN[facility];  Service: Cardiovascular;  Laterality: N/A;   EXCHANGE OF A DIALYSIS CATHETER  11/20/2016   Procedure: Exchange Of A Dialysis Catheter;  Surgeon: Selinda RAMAN Marea, MD;  Location: [facility] INVASIVE CV LAB;  Service: Cardiovascular;;   fistulagrams Right    HEMATOMA  EVACUATION Right 12/13/2016   Procedure: EVACUATION HEMATOMA;  Surgeon: Selinda GORMAN Gu, MD;  Location: [facility] ORS;  Service: Vascular;  Laterality: Right;   HEMATOMA EVACUATION Right 12/19/2016   Procedure: EVACUATIONof seroma;  Surgeon: Selinda GORMAN Gu, MD;  Location: [facility] ORS;  Service: Vascular;  Laterality: Right;   HOT HEMOSTASIS N/A 04/12/2016   Procedure: HOT HEMOSTASIS (ARGON PLASMA COAGULATION/BICAP);  Surgeon: Kavitha V Nandigam, MD;  Location: [facility] ENDOSCOPY;  Service: Endoscopy;  Laterality: N/A;   KIDNEY TRANSPLANT     LIGATIONS OF HERO GRAFT Right 04/25/2018   Procedure: LIGATIONS OF HERO GRAFT ( REVISION );  Surgeon: Gu Selinda GORMAN, MD;  Location: [facility] OR[facility];  Service: Vascular;  Laterality: Right;   PERIPHERAL VASCULAR CATHETERIZATION Bilateral 10/02/2016   Procedure: Upper Extremity Venography;  Surgeon: Selinda GORMAN Gu, MD;  Location: [facility] IN[facility] CV [facility];  Service: Cardiovascular;  Laterality: Bilateral;   PERIPHERAL VASCULAR CATHETERIZATION Right 11/08/2016   Procedure: DIALYSIS/PERMA CATHETER REMOVAL right Jugular;  Surgeon: Selinda GORMAN Gu, MD;  Location: [facility] OR[facility];  Service: Vascular;  Laterality: Right;   PERIPHERAL VASCULAR CATHETERIZATION Left 11/08/2016   Procedure: DIALYSIS/PERMA CATHETER INSERTION left jugular with u/s guide and flouroscan;  Surgeon: Selinda GORMAN Gu, MD;  Location: [facility] OR[facility];  Service: Vascular;   Laterality: Left;   PERIPHERAL VASCULAR CATHETERIZATION N/A 11/08/2016   Procedure: IVC FILTER INSERTION;  Surgeon: Selinda GORMAN Gu, MD;  Location: [facility] ORS;  Service: Vascular;  Laterality: N/A;   perm a cath in Rt upper chest Right    THORACENTESIS     UPPER EXTREMITY ANGIOGRAPHY Right 11/28/2018   Procedure: UPPER EXTREMITY ANGIOGRAPHY;  Surgeon: Gu Selinda GORMAN, MD;  Location: [facility];  Service: Cardiovascular;  Laterality: Right;   UPPER EXTREMITY VENOGRAPHY Bilateral 11/27/2019   Procedure: UPPER EXTREMITY VENOGRAPHY;  Surgeon: Gu Selinda GORMAN, MD;  Location: [facility];  Service: Cardiovascular;  Laterality: Bilateral;   VASCULAR ACCESS DEVICE INSERTION Right 11/08/2016   Procedure: INSERTION OF HERO VASCULAR ACCESS DEVICE ( GRAFT );  Surgeon: Selinda GORMAN Gu, MD;  Location: [facility] ORS;  Service: Vascular;  Laterality: Right;   Family History Family History  Problem Relation Age of Onset   Heart disease Mother    Cancer Mother        ovarian   CAD Mother    Cancer Maternal Grandmother    Heart attack Maternal Grandmother    Lung cancer Maternal Grandmother    Cancer Maternal Grandfather    Heart attack Maternal Grandfather    Lung cancer Maternal Grandfather    Asthma Son    Asthma Son    Kidney failure Maternal Aunt    Diabetes Maternal Aunt     Social History Social History[1] Allergies Lisinopril   Review of Systems Review of Systems  Physical Exam Vital Signs  I have reviewed the triage vital signs BP (!) 154/94 (BP Location: Right Arm)   Pulse (!) 117   Temp 99.2 F (37.3 C) (Oral)   Resp (!) 26   SpO2 97%   Physical Exam Vitals and nursing note reviewed.  Constitutional:      Appearance: He is ill-appearing.  HENT:     Head: Normocephalic and atraumatic.  Cardiovascular:     Rate and Rhythm: Tachycardia present.  Pulmonary:     Effort: Pulmonary effort is normal.     Breath sounds: Normal breath sounds.  Abdominal:     General: Abdomen is  flat.     Palpations: Abdomen is soft.  Tenderness: There is generalized abdominal tenderness.  Skin:    General: Skin is warm.  Neurological:     General: No focal deficit present.     Mental Status: He is alert.     Cranial Nerves: No cranial nerve deficit.     Motor: No weakness.     ED Results and Treatments Labs (all labs ordered are listed, but only abnormal results are displayed) Labs Reviewed  CBC WITH DIFFERENTIAL/PLATELET - Abnormal; Notable for the following components:      Result Value   WBC 3.9 (*)    Platelets 107 (*)    Lymphs Abs 0.1 (*)    All other components within normal limits  URINALYSIS, W/ REFLEX TO CULTURE (INFECTION SUSPECTED) - Abnormal; Notable for the following components:   APPearance HAZY (*)    Glucose, UA >=500 (*)    Hgb urine dipstick SMALL (*)    Protein, ur 30 (*)    Leukocytes,Ua LARGE (*)    All other components within normal limits  COMPREHENSIVE METABOLIC PANEL WITH GFR - Abnormal; Notable for the following components:   Potassium 2.9 (*)    Glucose, Bld 185 (*)    BUN 29 (*)    Creatinine, Ser 1.85 (*)    Calcium  11.1 (*)    Total Protein 8.6 (*)    GFR, Estimated 42 (*)    All other components within normal limits  RESP PANEL BY RT-PCR (RSV, FLU A&B, COVID)  RVPGX2  CULTURE, BLOOD (ROUTINE X 2)  CULTURE, BLOOD (ROUTINE X 2)  URINE CULTURE  TACROLIMUS  LEVEL  I-STAT CG4 LACTIC ACID, ED  I-STAT CG4 LACTIC ACID, ED                                                                                                                          Radiology CT ABDOMEN PELVIS WO CONTRAST Result Date: 10/17/2024 CLINICAL DATA:  Bilateral flank pain, abdominal distention, nausea, vomiting, and fever. History of renal transplant. EXAM: CT ABDOMEN AND PELVIS WITHOUT CONTRAST TECHNIQUE: Multidetector CT imaging of the abdomen and pelvis was performed following the standard protocol without IV contrast. RADIATION DOSE REDUCTION: This exam was  performed according to the departmental dose-optimization program which includes automated exposure control, adjustment of the mA and/or kV according to patient size and/or use of iterative reconstruction technique. COMPARISON:  CT abdomen and pelvis dated 03/14/2024 FINDINGS: Lower chest: Partially imaged right pleural calcifications/thickening and pleural effusion with multifocal basilar right lower lobe round atelectasis. Left basilar subsegmental atelectasis. No pneumothorax demonstrated. Partially imaged heart size is normal. Hepatobiliary: No focal hepatic lesions. No intra or extrahepatic biliary ductal dilation. Normal gallbladder. Pancreas: No focal lesions or main ductal dilation. Spleen: Normal in size without focal abnormality. Adrenals/Urinary Tract: No adrenal nodules. Atrophic bilateral native kidneys. 1.1 cm posterior right and 1.2 cm exophytic left interpolar simple/minimally complicated cysts. No specific follow-up imaging recommended. Left lower quadrant transplant kidney. No hydronephrosis or calculi. Additional atrophic right lower quadrant  transplant kidney. No focal bladder wall thickening. Stomach/Bowel: Normal appearance of the stomach. No evidence of bowel wall thickening, distention, or inflammatory changes. Normal appendix. Vascular/Lymphatic: Aortic atherosclerosis. Right common iliac vein stent in-situ. Peripheral calcification within the IVC near the proximal aspect of the stent, likely chronic thrombus. Subcutaneous varices throughout the anterior abdomen. No enlarged abdominal or pelvic lymph nodes. Reproductive: Enlargement of the prostate with median lobe hypertrophy. Other: No free fluid, fluid collection, or free air. Musculoskeletal: No acute or abnormal lytic or blastic osseous lesions. Small fat-containing paraumbilical and left inguinal hernias. IMPRESSION: 1. No acute abdominopelvic abnormality. 2. Left lower quadrant transplant kidney without hydronephrosis or calculi. 3.   Aortic Atherosclerosis (ICD10-I70.0). Electronically Signed   By: Limin  Xu M.D.   On: 10/17/2024 19:13   DG Chest 1 View Result Date: 10/17/2024 CLINICAL DATA:  Cough EXAM: CHEST  1 VIEW COMPARISON:  03/14/2024, 10/20/2019, CT 05/02/2018 FINDINGS: Volume loss right thorax with chronic pleural thickening and round atelectasis at the right base. Radiographic appearance of the chest appears grossly similar compared with May examination. Stable cardiomediastinal silhouette. No pneumothorax IMPRESSION: Chronic volume loss right thorax with chronic pleural thickening and round atelectasis at the right base. No acute airspace disease. Electronically Signed   By: Luke Bun M.D.   On: 10/17/2024 17:44    Pertinent labs & imaging results that were available during my care of the patient were reviewed by me and considered in my medical decision making (see MDM for details).  Medications Ordered in ED Medications  potassium chloride  10 mEq in 100 mL IVPB (has no administration in time range)  tacrolimus  ER (ENVARSUS  XR) tablet 6 mg (has no administration in time range)  predniSONE  (DELTASONE ) tablet 5 mg (has no administration in time range)  sodium chloride  0.9 % bolus 1,000 mL (1,000 mLs Intravenous New Bag/Given 10/17/24 1708)  ceFEPIme (MAXIPIME) 2 g in sodium chloride  0.9 % 100 mL IVPB (0 g Intravenous Stopped 10/17/24 1732)  acetaminophen  (TYLENOL ) tablet 1,000 mg (1,000 mg Oral Given 10/17/24 1824)  ondansetron  (ZOFRAN ) injection 4 mg (4 mg Intravenous Given 10/17/24 1729)                                                                                                                                     Procedures Procedures  (including critical care time)  Medical Decision Making / ED Course   This patient presents to the ED for concern of fever and chills, this involves an extensive number of treatment options, and is a complaint that carries with it a high risk of complications and morbidity.   The differential diagnosis includes viral syndrome, bacteremia, pyelonephritis, cystitis.  MDM: Patient tachycardic, borderline febrile.  Given immunocompromise status, will provide broad-spectrum antibiotics, fluids ordered.  They are also currently in flu season, flu swab ordered.  With reported flank pain, will obtain imaging of patient's abdomen and pelvis.  Reviewed old cultures,  will start patient on cefepime.  Reassessment 8:10 PM-patient's urine equivocal.  I started with better after some IV fluids and Tylenol .  Chest x-ray negative, viral swabs negative.  CT of the abdomen pelvis negative.  Reviewed the patient's prior urine culture from any bacteremia in May, did appear similar to the one today.  Did reach out to [facility] since patient is a transplant patient.  Spoke with Dr. Hussain.  After hearing about the patient, she believed he was appropriate to remain at our facility.  They are available for consultation if there are any additional questions.  They agree with continuing antibiotics pending blood and urine cultures.  They request the patient be continued on his prednisone  and tacrolimus  in the absence of septic shock.   Additional history obtained: -Additional history obtained from wife at bedside -External records from outside source obtained and reviewed including: Chart review including previous notes, labs, imaging, consultation notes   Lab Tests: -I ordered, reviewed, and interpreted labs.   The pertinent results include:   Labs Reviewed  CBC WITH DIFFERENTIAL/PLATELET - Abnormal; Notable for the following components:      Result Value   WBC 3.9 (*)    Platelets 107 (*)    Lymphs Abs 0.1 (*)    All other components within normal limits  URINALYSIS, W/ REFLEX TO CULTURE (INFECTION SUSPECTED) - Abnormal; Notable for the following components:   APPearance HAZY (*)    Glucose, UA >=500 (*)    Hgb urine dipstick SMALL (*)    Protein, ur 30 (*)    Leukocytes,Ua LARGE  (*)    All other components within normal limits  COMPREHENSIVE METABOLIC PANEL WITH GFR - Abnormal; Notable for the following components:   Potassium 2.9 (*)    Glucose, Bld 185 (*)    BUN 29 (*)    Creatinine, Ser 1.85 (*)    Calcium  11.1 (*)    Total Protein 8.6 (*)    GFR, Estimated 42 (*)    All other components within normal limits  RESP PANEL BY RT-PCR (RSV, FLU A&B, COVID)  RVPGX2  CULTURE, BLOOD (ROUTINE X 2)  CULTURE, BLOOD (ROUTINE X 2)  URINE CULTURE  TACROLIMUS  LEVEL  I-STAT CG4 LACTIC ACID, ED  I-STAT CG4 LACTIC ACID, ED      Imaging Studies ordered: I ordered imaging studies including chest x-ray, CT abdomen pelvis I independently visualized and interpreted imaging. I agree with the radiologist interpretation   Medicines ordered and prescription drug management: Meds ordered this encounter  Medications   sodium chloride  0.9 % bolus 1,000 mL   ceFEPIme (MAXIPIME) 2 g in sodium chloride  0.9 % 100 mL IVPB    Antibiotic Indication::   UTI   acetaminophen  (TYLENOL ) tablet 1,000 mg   ondansetron  (ZOFRAN ) injection 4 mg   potassium chloride  10 mEq in 100 mL IVPB   tacrolimus  ER (ENVARSUS  XR) tablet 6 mg   predniSONE  (DELTASONE ) tablet 5 mg    -I have reviewed the patients home medicines and have made adjustments as needed   Cardiac Monitoring: The patient was maintained on a cardiac monitor.  I personally viewed and interpreted the cardiac monitored which showed an underlying rhythm of: Sinus tachycardia  Social Determinants of Health:  Factors impacting patients care include: Medical comorbidities including renal transplant   Reevaluation: After the interventions noted above, I reevaluated the patient and found that they have :improved  Co morbidities that complicate the patient evaluation  Past Medical History:  Diagnosis Date  Anemia    Asthma    childhood   Blood transfusion without reported diagnosis    CHF (congestive heart failure) (HCC)     Chronic diastolic heart failure (HCC) 02/28/2020   Chronic kidney disease    Dialysis Mon, Wed, Fri   Colitis    COPD (chronic obstructive pulmonary disease) (HCC)    Diverticulosis 04/12/16   also seen: sigmoid and rectal erythema, path:    Dyspnea    Heart murmur    Hemorrhoids 03/2016   bleeding at 04/12/16 colonoscopy, treated with  APC laser. cauterization   Hyperlipidemia    Hypertension    OSA (obstructive sleep apnea) 03/27/2013        Final Clinical Impression(s) / ED Diagnoses Final diagnoses:  Fever, unspecified fever cause  Immunocompromised  Nausea and vomiting, unspecified vomiting type     @PCDICTATION @     [1]  Social History Tobacco Use   Smoking status: Never   Smokeless tobacco: Never  Vaping Use   Vaping status: Never Used  Substance Use Topics   Alcohol use: No   Drug use: No     Mannie Fairy DASEN, DO 10/17/24 2013  "

## 2024-10-17 NOTE — Hospital Course (Signed)
 Jonathon Conley is a 59 y.o. male with medical history significant for ESRD s/p failed kidney transplant 2002 s/p second kidney transplant (02/27/2022 at [facility]) on tacrolimus  and prednisone , HTN, HLD, BPH, chronic thrombocytopenia, OSA on BiPAP who is admitted with AKI in setting of GI losses from frequent vomiting/diarrhea with fever.

## 2024-10-17 NOTE — ED Triage Notes (Signed)
 BIB EMS for diarrhea, N/V. Abdominal distention. Kidney transplant 1.5 years ago. Recent labs were abnormal. Fever of 101.6 last night. Has not taken morning meds. Bilateral flank pain. 20g L wrist 4mg  Zofran 

## 2024-10-17 NOTE — H&P (Signed)
 " History and Physical    Jonathon Conley FMW:[id number] DOB: 07/14/1966 DOA: 10/17/2024  PCP: Jegede, Olugbemiga E, MD  Patient coming from: Home  I have personally briefly reviewed patient's old medical records in [facility]  Chief Complaint: Fever, nausea, vomiting  HPI: Jonathon Conley is a 59 y.o. male with medical history significant for ESRD s/p failed kidney transplant 2002 s/p second kidney transplant (02/27/2022 at [facility]) on tacrolimus  and prednisone , HTN, HLD, BPH, chronic thrombocytopenia, OSA on BiPAP who presents to the ED for evaluation of fever, nausea, vomiting, diarrhea.  Patient reports he was in his usual state of health until around 5 AM this morning when he woke up with some abdominal discomfort.  He was feeling constipated.  Shortly afterwards he developed new onset of nausea associated frequent emesis as well as frequent episodes of diarrhea.  He has not seen any significant blood in his emesis or stools.  He has had multiple episodes of emesis and diarrhea occurring about every hour since onset.  He started to have fevers at home up to 102 F.  He has been feeling rundown and has been having a lot of abdominal and shoulder muscle pain from frequent emesis.  He otherwise reports good urine output without dysuria.  He has not had any recent changes in his medications other than decrease in amlodipine  dosing from 10 mg to 5 mg and addition of chlorthalidone due to the recent issues with right lower extremity swelling.  Patient states that the leg swelling has resolved with the medication change.  ED Course  Labs/Imaging on admission: I have personally reviewed following labs and imaging studies.  Initial vitals showed BP 141/88, pulse 127, RR 20, temp 100.1 F, SpO2 96% on room air.  Labs showed WBC 3.9, hemoglobin 15.6, platelets 107, sodium 141, potassium 2.9, bicarb 29, BUN 29, creatinine 1.85, serum glucose 185, albumin 4.6, calcium  11.1, LFTs within normal limits,  lactic acid 1.9.  SARS-CoV-2, influenza, RSV PCR negative.  UA showed negative nitrites, large leukocytes, 0-5 RBCs, 21-50 WBCs, no bacteria.  Blood and urine cultures in process.  Tacrolimus  level in process.  Portable chest x-ray showed chronic volume loss right thorax with chronic pleural thickening and round atelectasis at the right base.  No acute airspace disease.  CT abdomen/pelvis without contrast negative for acute abdominopelvic abnormality.  Left lower quadrant transplant kidney without hydronephrosis or calculi.  Patient was given 1 L normal saline, IV cefepime, IV K 10 mEq x 1.  EDP discussed the case with [facility] transplant nephrology (Dr. Lunette) who felt patient did not require transfer to their facility and recommended continuing home tacrolimus  and prednisone  and admission here for continued antibiotics pending infectious workup.  The hospitalist service was consulted for admission.  Review of Systems: All systems reviewed and are negative except as documented in history of present illness above.   Past Medical History:  Diagnosis Date   Anemia    Asthma    childhood   Blood transfusion without reported diagnosis    CHF (congestive heart failure) (HCC)    Chronic diastolic heart failure (HCC) 02/28/2020   Chronic kidney disease    Dialysis Mon, Wed, Fri   Colitis    COPD (chronic obstructive pulmonary disease) (HCC)    Diverticulosis 04/12/16   also seen: sigmoid and rectal erythema, path:    Dyspnea    Heart murmur    Hemorrhoids 03/2016   bleeding at 04/12/16 colonoscopy, treated with  APC laser. cauterization  Hyperlipidemia    Hypertension    Nausea, vomiting, and diarrhea 10/17/2024   OSA (obstructive sleep apnea) 03/27/2013    Past Surgical History:  Procedure Laterality Date   A/V SHUNTOGRAM Right 04/17/2019   Procedure: A/V SHUNTOGRAM;  Surgeon: Marea Selinda RAMAN, MD;  Location: [facility] INVASIVE CV LAB;  Service: Cardiovascular;  Laterality: Right;   APPLICATION OF  WOUND VAC Right 12/13/2016   Procedure: APPLICATION OF WOUND VAC;  Surgeon: Selinda RAMAN Marea, MD;  Location: [facility] ORS;  Service: Vascular;  Laterality: Right;   APPLICATION OF WOUND VAC Right 12/19/2016   Procedure: APPLICATION OF WOUND VAC;  Surgeon: Selinda RAMAN Marea, MD;  Location: [facility] ORS;  Service: Vascular;  Laterality: Right;   COLONOSCOPY N/A 04/12/2016   Procedure: COLONOSCOPY;  Surgeon: Gustav LULLA Mcgee, MD;  Location: [facility] ENDOS[facility];  Service: Endoscopy;  Laterality: N/A;   DIALYSIS/PERMA CATHETER INSERTION N/A 02/26/2017   Procedure: Dialysis/Perma Catheter Insertion;  Surgeon: Marea Selinda RAMAN, MD;  Location: [facility] CV [facility];  Service: Cardiovascular;  Laterality: N/A;   DIALYSIS/PERMA CATHETER INSERTION N/A 03/26/2018   Procedure: DIALYSIS/PERMA CATHETER INSERTION;  Surgeon: Jama Cordella MATSU, MD;  Location: [facility] CV LAB;  Service: Cardiovascular;  Laterality: N/A;   DIALYSIS/PERMA CATHETER REMOVAL N/A 01/16/2019   Procedure: DIALYSIS/PERMA CATHETER REMOVAL;  Surgeon: Marea Selinda RAMAN, MD;  Location: [facility] CV [facility];  Service: Cardiovascular;  Laterality: N/A;   EXCHANGE OF A DIALYSIS CATHETER  11/20/2016   Procedure: Exchange Of A Dialysis Catheter;  Surgeon: Selinda RAMAN Marea, MD;  Location[facility] CV LAB;  Service: Cardiovascular;;   fistulagrams Right    HEMATOMA EVACUATION Right 12/13/2016   Procedure: EVACUATION HEMATOMA;  Surgeon: Selinda RAMAN Marea, MD;  Location: [facility] ORS;  Service: Vascular;  Laterality: Right;   HEMATOMA EVACUATION Right 12/19/2016   Procedure: EVACUATIONof seroma;  Surgeon: Selinda RAMAN Marea, MD;  Location: [facility] ORS;  Service: Vascular;  Laterality: Right;   HOT HEMOSTASIS N/A 04/12/2016   Procedure: HOT HEMOSTASIS (ARGON PLASMA COAGULATION/BICAP);  Surgeon: Kavitha V Nandigam, MD;  Location: [facility] ENDOSCOPY;  Service: Endoscopy;  Laterality: N/A;   KIDNEY TRANSPLANT     LIGATIONS OF HERO GRAFT Right 04/25/2018   Procedure: LIGATIONS OF HERO GRAFT ( REVISION );  Surgeon: Marea Selinda RAMAN, MD;  Location: [facility] ORS;  Service: Vascular;  Laterality: Right;   PERIPHERAL VASCULAR CATHETERIZATION Bilateral 10/02/2016   Procedure: Upper Extremity Venography;  Surgeon: Selinda RAMAN Marea, MD;  Location: [facility] INV[facility] CV LAB;  Service: Cardiovascular;  Laterality: Bilateral;   PERIPHERAL VASCULAR CATHETERIZATION Right 11/08/2016   Procedure: DIALYSIS/PERMA CATHETER REMOVAL right Jugular;  Surgeon: Selinda RAMAN Marea, MD;  Location: [facility] ORS;  Service: Vascular;  Laterality: Right;   PERIPHERAL VASCULAR CATHETERIZATION Left 11/08/2016   Procedure: DIALYSIS/PERMA CATHETER INSERTION left jugular with u/s guide and flouroscan;  Surgeon: Selinda RAMAN Marea, MD;  Location: [facility] ORS;  Service: Vascular;  Laterality: Left;   PERIPHERAL VASCULAR CATHETERIZATION N/A 11/08/2016   Procedure: IVC FILTER INSERTION;  Surgeon: Selinda RAMAN Marea, MD;  Location: [facility] ORS;  Service: Vascular;  Laterality: N/A;   perm a cath in Rt upper chest Right    THORACENTESIS     UPPER EXTREMITY ANGIOGRAPHY Right 11/28/2018   Procedure: UPPER EXTREMITY ANGIOGRAPHY;  Surgeon: Marea Selinda RAMAN, MD;  Location: [facility];  Service: Cardiovascular;  Laterality: Right;   UPPER EXTREMITY VENOGRAPHY Bilateral 11/27/2019   Procedure: UPPER EXTREMITY VENOGRAPHY;  Surgeon: Marea Selinda RAMAN, MD;  Location: [facility] INV[facility] CV  LAB;  Service: Cardiovascular;  Laterality: Bilateral;   VASCULAR ACCESS DEVICE INSERTION Right 11/08/2016   Procedure: INSERTION OF HERO VASCULAR ACCESS DEVICE ( GRAFT );  Surgeon: Selinda GORMAN Gu, MD;  Location: [facility] ORS;  Service: Vascular;  Laterality: Right;    Social History: Social History[1]  Allergies[2]  Family History  Problem Relation Age of Onset   Heart disease Mother    Cancer Mother        ovarian   CAD Mother    Cancer Maternal Grandmother    Heart attack Maternal Grandmother    Lung cancer Maternal Grandmother    Cancer Maternal Grandfather    Heart attack Maternal Grandfather    Lung cancer Maternal  Grandfather    Asthma Son    Asthma Son    Kidney failure Maternal Aunt    Diabetes Maternal Aunt      Prior to Admission medications  Medication Sig Start Date End Date Taking? Authorizing Provider  albuterol  (VENTOLIN  HFA) 108 (90 Base) MCG/ACT inhaler Inhale 2 puffs into the lungs every 6 (six) hours as needed for wheezing or shortness of breath.    [provider]  amLODipine  (NORVASC ) 10 MG tablet Take 10 mg by mouth daily. 03/03/24   [provider]  aspirin  EC 81 MG tablet Take 81 mg by mouth daily.     [provider]  carvedilol  (COREG ) 25 MG tablet Take 25 mg by mouth 2 (two) times daily.     [provider]  Cholecalciferol (D 1000) 25 MCG (1000 UT) capsule Take 1,000 Units by mouth daily. 08/27/23   [provider]  empagliflozin (JARDIANCE) 10 MG TABS tablet Take 10 mg by mouth daily.    [provider]  predniSONE  (DELTASONE ) 5 MG tablet Take 5 mg by mouth daily.    [provider]  rosuvastatin (CRESTOR) 5 MG tablet Take 5 mg by mouth at bedtime.    [provider]  senna-docusate (SENOKOT-S) 8.6-50 MG tablet Take 1 tablet by mouth daily. 02/27/22   [provider]  tacrolimus  ER (ENVARSUS  XR) 1 MG TB24 Take 6 mg by mouth. 02/27/22 11/27/24  [provider]  tamsulosin  (FLOMAX ) 0.4 MG CAPS capsule Take 0.4 mg by mouth. 02/27/22   [provider]    Physical Exam: Vitals:   10/17/24 1832 10/17/24 2100 10/17/24 2115 10/17/24 2247  BP: (!) 154/94 106/72 112/67 105/78  Pulse: (!) 117 (!) 110 (!) 103 (!) 102  Resp: (!) 26 (!) 23 20 16   Temp: 99.2 F (37.3 C)   99.7 F (37.6 C)  TempSrc: Oral   Oral  SpO2: 97% 97% 95% 93%   Constitutional: Resting in bed, NAD, calm, comfortable Eyes: EOMI, lids and conjunctivae normal ENMT: Mucous membranes are dry. Posterior pharynx clear of any exudate or lesions.Normal dentition.  Neck: normal, supple, no masses. Respiratory: clear to  auscultation bilaterally, no wheezing, no crackles. Normal respiratory effort. No accessory muscle use.  Cardiovascular: Regular rate and rhythm, no murmurs / rubs / gallops. No extremity edema. 2+ pedal pulses. Abdomen: no tenderness, no masses palpated. Musculoskeletal: no clubbing / cyanosis. No joint deformity upper and lower extremities. Good ROM, no contractures. Normal muscle tone.  Skin: no rashes, lesions, ulcers. No induration Neurologic: Sensation intact. Strength 5/5 in all 4.  Psychiatric: Normal judgment and insight. Alert and oriented x 3. Normal mood.   EKG: Not performed. Assessment/Plan Principal Problem:   Febrile illness Active Problems:   Nausea, vomiting, and diarrhea  OSA (obstructive sleep apnea)   AKI (acute kidney injury)   Hyperlipidemia   Thrombocytopenia   H/O kidney transplant   HTN (hypertension)   Hypokalemia   Hypercalcemia   MAHESH SIZEMORE is a 59 y.o. male with medical history significant for ESRD s/p failed kidney transplant 2002 s/p second kidney transplant (02/27/2022 at [facility]) on tacrolimus  and prednisone , HTN, HLD, BPH, chronic thrombocytopenia, OSA on BiPAP who is admitted with AKI in setting of GI losses from frequent vomiting/diarrhea with fever.  Assessment and Plan: Febrile illness Nausea/vomiting/diarrhea: Patient presenting with new onset recurrent nausea, vomiting, diarrhea and fever at home.  He is on chronic immunosuppressants and has mild leukopenia without neutropenia.  CT A/P unrevealing for acute ideology. - Follow blood and urine cultures - COVID, influenza, RSV PCR negative - Check full respiratory viral panel - Continue empiric IV cefepime for now - IV fluid hydration overnight  Acute kidney injury S/p kidney transplant 02/27/2022: Creatinine 1.85 on admission compared to baseline ~1.1-1.3.  AKI is likely secondary to volume depletion from GI losses. - Continue IV fluid hydration overnight - Monitor UOP and repeat labs in  a.m. - EDP discussed with [facility] transplant nephrology team, they recommended continuing home tacrolimus  6 mg daily and prednisone  5 mg daily  Hypokalemia: Potassium added to IV fluids.  Check magnesium  level and replete if needed.  Hypercalcemia: Mild and likely secondary to volume depletion.  Continue IV fluids and recheck labs in AM.  Hypertension: Continue amlodipine  and carvedilol .  Holding chlorthalidone for now.  Chronic thrombocytopenia: Platelets stable at 107k.  Continue to monitor.  Hyperlipidemia: Continue rosuvastatin.  OSA: Continue BiPAP nightly.   DVT prophylaxis: heparin  injection 5,000 Units Start: 10/18/24 0600 Code Status: Full code, confirmed with patient on admission Family Communication: Discussed with patient, he has discussed with family Disposition Plan: From home and likely discharge to home pending clinical progress Consults called: EDP discussed with [facility] transplant nephrology team (Dr. Lunette) Severity of Illness: The appropriate patient status for this patient is OBSERVATION. Observation status is judged to be reasonable and necessary in order to provide the required intensity of service to ensure the patient's safety. The patient's presenting symptoms, physical exam findings, and initial radiographic and laboratory data in the context of their medical condition is felt to place them at decreased risk for further clinical deterioration. Furthermore, it is anticipated that the patient will be medically stable for discharge from the hospital within 2 midnights of admission.   Jorie Blanch MD [facility]  If 7PM-7AM, please contact night-coverage [email or web address]  10/17/2024, 11:22 PM      [1]  Social History Tobacco Use   Smoking status: Never   Smokeless tobacco: Never  Vaping Use   Vaping status: Never Used  Substance Use Topics   Alcohol use: No   Drug use: No  [2]  Allergies Allergen Reactions   Lisinopril  Swelling and Other (See  Comments)    Facial swelling   "

## 2024-10-17 NOTE — Sepsis Progress Note (Signed)
 Code Sepsis protocol being monitored by eLink.

## 2024-10-17 NOTE — ED Notes (Signed)
 One set of blood cultures obtained  Unable to get access for second set

## 2024-10-17 NOTE — ED Notes (Signed)
 Unable to obtain IV access  IV consult ordered

## 2024-10-17 NOTE — Progress Notes (Signed)
" °   10/17/24 2322  BiPAP/CPAP/SIPAP  $ Non-Invasive Home Ventilator  Initial  $ Face Mask Large  Yes  BiPAP/CPAP/SIPAP Pt Type Adult  BiPAP/CPAP/SIPAP Resmed  Mask Type Full face mask  Dentures removed? Not applicable  Mask Size Large  Respiratory Rate 18 breaths/min  IPAP 12 cmH20  EPAP 6 cmH2O  FiO2 (%) 21 %  Patient Home Machine No  Patient Home Mask No  Patient Home Tubing No  Auto Titrate No  Nasal massage performed Yes  CPAP/SIPAP surface wiped down Yes  Device Plugged into RED Power Outlet Yes  BiPAP/CPAP /SiPAP Vitals  Pulse Rate 96  Resp 18  SpO2 97 %  Bilateral Breath Sounds Clear;Diminished    "

## 2024-10-18 ENCOUNTER — Other Ambulatory Visit: Payer: Self-pay

## 2024-10-18 ENCOUNTER — Encounter (HOSPITAL_COMMUNITY): Payer: Self-pay | Admitting: Internal Medicine

## 2024-10-18 DIAGNOSIS — N179 Acute kidney failure, unspecified: Secondary | ICD-10-CM | POA: Diagnosis present

## 2024-10-18 DIAGNOSIS — J4489 Other specified chronic obstructive pulmonary disease: Secondary | ICD-10-CM | POA: Diagnosis present

## 2024-10-18 DIAGNOSIS — Z8249 Family history of ischemic heart disease and other diseases of the circulatory system: Secondary | ICD-10-CM | POA: Diagnosis not present

## 2024-10-18 DIAGNOSIS — K5900 Constipation, unspecified: Secondary | ICD-10-CM | POA: Diagnosis present

## 2024-10-18 DIAGNOSIS — E869 Volume depletion, unspecified: Secondary | ICD-10-CM | POA: Diagnosis present

## 2024-10-18 DIAGNOSIS — I5032 Chronic diastolic (congestive) heart failure: Secondary | ICD-10-CM | POA: Diagnosis present

## 2024-10-18 DIAGNOSIS — T8619 Other complication of kidney transplant: Secondary | ICD-10-CM | POA: Diagnosis present

## 2024-10-18 DIAGNOSIS — Z801 Family history of malignant neoplasm of trachea, bronchus and lung: Secondary | ICD-10-CM | POA: Diagnosis not present

## 2024-10-18 DIAGNOSIS — Z79899 Other long term (current) drug therapy: Secondary | ICD-10-CM | POA: Diagnosis not present

## 2024-10-18 DIAGNOSIS — K529 Noninfective gastroenteritis and colitis, unspecified: Secondary | ICD-10-CM | POA: Diagnosis not present

## 2024-10-18 DIAGNOSIS — Z833 Family history of diabetes mellitus: Secondary | ICD-10-CM | POA: Diagnosis not present

## 2024-10-18 DIAGNOSIS — E876 Hypokalemia: Secondary | ICD-10-CM | POA: Diagnosis present

## 2024-10-18 DIAGNOSIS — E785 Hyperlipidemia, unspecified: Secondary | ICD-10-CM | POA: Diagnosis present

## 2024-10-18 DIAGNOSIS — Z825 Family history of asthma and other chronic lower respiratory diseases: Secondary | ICD-10-CM | POA: Diagnosis not present

## 2024-10-18 DIAGNOSIS — Z1152 Encounter for screening for COVID-19: Secondary | ICD-10-CM | POA: Diagnosis not present

## 2024-10-18 DIAGNOSIS — J9811 Atelectasis: Secondary | ICD-10-CM | POA: Diagnosis present

## 2024-10-18 DIAGNOSIS — N400 Enlarged prostate without lower urinary tract symptoms: Secondary | ICD-10-CM | POA: Diagnosis present

## 2024-10-18 DIAGNOSIS — D849 Immunodeficiency, unspecified: Secondary | ICD-10-CM | POA: Diagnosis present

## 2024-10-18 DIAGNOSIS — Y830 Surgical operation with transplant of whole organ as the cause of abnormal reaction of the patient, or of later complication, without mention of misadventure at the time of the procedure: Secondary | ICD-10-CM | POA: Diagnosis present

## 2024-10-18 DIAGNOSIS — Z7984 Long term (current) use of oral hypoglycemic drugs: Secondary | ICD-10-CM | POA: Diagnosis not present

## 2024-10-18 DIAGNOSIS — D696 Thrombocytopenia, unspecified: Secondary | ICD-10-CM | POA: Diagnosis present

## 2024-10-18 DIAGNOSIS — I110 Hypertensive heart disease with heart failure: Secondary | ICD-10-CM | POA: Diagnosis present

## 2024-10-18 DIAGNOSIS — D84821 Immunodeficiency due to drugs: Secondary | ICD-10-CM | POA: Diagnosis present

## 2024-10-18 DIAGNOSIS — Z7982 Long term (current) use of aspirin: Secondary | ICD-10-CM | POA: Diagnosis not present

## 2024-10-18 DIAGNOSIS — R509 Fever, unspecified: Secondary | ICD-10-CM | POA: Diagnosis not present

## 2024-10-18 DIAGNOSIS — G4733 Obstructive sleep apnea (adult) (pediatric): Secondary | ICD-10-CM | POA: Diagnosis present

## 2024-10-18 DIAGNOSIS — R112 Nausea with vomiting, unspecified: Secondary | ICD-10-CM | POA: Diagnosis present

## 2024-10-18 LAB — CBC
HCT: 39.3 % (ref 39.0–52.0)
Hemoglobin: 12.9 g/dL — ABNORMAL LOW (ref 13.0–17.0)
MCH: 27.9 pg (ref 26.0–34.0)
MCHC: 32.8 g/dL (ref 30.0–36.0)
MCV: 84.9 fL (ref 80.0–100.0)
Platelets: 97 K/uL — ABNORMAL LOW (ref 150–400)
RBC: 4.63 MIL/uL (ref 4.22–5.81)
RDW: 15.5 % (ref 11.5–15.5)
WBC: 4.7 K/uL (ref 4.0–10.5)
nRBC: 0 % (ref 0.0–0.2)

## 2024-10-18 LAB — BASIC METABOLIC PANEL WITH GFR
Anion gap: 11 (ref 5–15)
BUN: 31 mg/dL — ABNORMAL HIGH (ref 6–20)
CO2: 22 mmol/L (ref 22–32)
Calcium: 9.1 mg/dL (ref 8.9–10.3)
Chloride: 102 mmol/L (ref 98–111)
Creatinine, Ser: 1.77 mg/dL — ABNORMAL HIGH (ref 0.61–1.24)
GFR, Estimated: 44 mL/min — ABNORMAL LOW
Glucose, Bld: 139 mg/dL — ABNORMAL HIGH (ref 70–99)
Potassium: 3.4 mmol/L — ABNORMAL LOW (ref 3.5–5.1)
Sodium: 136 mmol/L (ref 135–145)

## 2024-10-18 LAB — RESPIRATORY PANEL BY PCR

## 2024-10-18 LAB — MAGNESIUM
Magnesium: 1.6 mg/dL — ABNORMAL LOW (ref 1.7–2.4)
Magnesium: 1.5 mg/dL — ABNORMAL LOW (ref 1.7–2.4)

## 2024-10-18 MED ORDER — MAGNESIUM SULFATE 2 GM/50ML IV SOLN
2.0000 g | Freq: Once | INTRAVENOUS | Status: AC
  Administered 2024-10-18: 2 g via INTRAVENOUS
  Filled 2024-10-18: qty 50

## 2024-10-18 MED ORDER — DICYCLOMINE HCL 10 MG PO CAPS
10.0000 mg | ORAL_CAPSULE | Freq: Three times a day (TID) | ORAL | Status: DC
  Administered 2024-10-18 – 2024-10-19 (×3): 10 mg via ORAL
  Filled 2024-10-18 (×3): qty 1

## 2024-10-18 MED ORDER — HYALURONIDASE HUMAN 150 UNIT/ML IJ SOLN
150.0000 [IU] | Freq: Once | INTRAMUSCULAR | Status: AC
  Administered 2024-10-18: 150 [IU] via SUBCUTANEOUS
  Filled 2024-10-18: qty 1

## 2024-10-18 MED ORDER — ORAL CARE MOUTH RINSE: 15.0000 mL | OROMUCOSAL | Status: DC | PRN

## 2024-10-18 MED ORDER — DICYCLOMINE HCL 10 MG PO CAPS
10.0000 mg | ORAL_CAPSULE | Freq: Three times a day (TID) | ORAL | Status: DC
  Filled 2024-10-18: qty 1

## 2024-10-18 MED ORDER — SODIUM CHLORIDE 0.9 % IV SOLN: INTRAVENOUS | Status: DC

## 2024-10-18 MED ORDER — POTASSIUM CHLORIDE CRYS ER 20 MEQ PO TBCR
40.0000 meq | EXTENDED_RELEASE_TABLET | Freq: Once | ORAL | Status: AC
  Administered 2024-10-18: 40 meq via ORAL
  Filled 2024-10-18: qty 2

## 2024-10-18 MED ORDER — POTASSIUM CHLORIDE 10 MEQ/100ML IV SOLN
10.0000 meq | INTRAVENOUS | Status: DC
  Filled 2024-10-18: qty 100

## 2024-10-18 NOTE — Progress Notes (Signed)
" °  [facility] Progress Note  Patient: Jonathon Conley     FMW:[id number]  DOA: 10/17/2024   PCP: Jegede, Olugbemiga E, MD       Brief hospital course: 59 y/o with 2nd renal transplant, HTN, OSA presented for fever, nausea and vomiting. CT abd/pelvis unrevealing. Admitted for further treatment.   Subjective:  He tried broth and had diarrhea.   Assessment and Plan: Principal Problem:   Febrile illness with nausea and vomiting in an immune compromised patient - cont conservative care with IVF, anti-emetics  Active Problems:  Hypokalemia/hypomagnesemia - continue to replace  AKI (acute kidney injury)- h/o renal transplant - Cr 1.34 in 03/17/24 - now 1.85- follow and continue IVF - cont tacrolimus  and prednisone   Hypercalcemia - Ca 9.1  Thrombocytopenia - semi acute since this past summer         Code Status: Full Code Total time on patient care: 35 min DVT prophylaxis:  heparin  injection 5,000 Units Start: 10/18/24 0600   Objective:   Vitals:   10/18/24 0610 10/18/24 1003 10/18/24 1013 10/18/24 1518  BP: 118/78 125/74 106/68 118/70  Pulse: 97 94 90 83  Resp: 18 16 16 17   Temp: 98.5 F (36.9 C) 99.7 F (37.6 C) 98.6 F (37 C) 98 F (36.7 C)  TempSrc: Oral Oral  Oral  SpO2: 100% 99% 94% 99%  Weight:      Height:       Filed Weights   10/18/24 0229  Weight: 98.7 kg   Exam: General exam: Appears comfortable  HEENT: oral mucosa moist Respiratory system: Clear to auscultation.  Cardiovascular system: S1 & S2 heard  Gastrointestinal system: Abdomen soft, non-tender, nondistended. Normal bowel sounds   Extremities: No cyanosis, clubbing or edema Psychiatry:  Mood & affect appropriate.   CBC: Recent Labs  Lab 10/17/24 1626 10/18/24 0330  WBC 3.9* 4.7  NEUTROABS 3.5  --   HGB 15.6 12.9*  HCT 48.5 39.3  MCV 86.1 84.9  PLT 107* 97*   Basic Metabolic Panel: Recent Labs  Lab 10/17/24 1626 10/17/24 2328 10/18/24 0330  NA 141  --  136  K 2.9*   --  3.4*  CL 98  --  102  CO2 29  --  22  GLUCOSE 185*  --  139*  BUN 29*  --  31*  CREATININE 1.85*  --  1.77*  CALCIUM  11.1*  --  9.1  MG  --  1.6* 1.5*     Scheduled Meds:  amLODipine   5 mg Oral Daily   carvedilol   25 mg Oral BID   heparin   5,000 Units Subcutaneous Q8H   predniSONE   5 mg Oral Daily   rosuvastatin   5 mg Oral QHS   tacrolimus  ER  6 mg Oral QAC breakfast   tamsulosin   0.4 mg Oral QHS    Imaging and lab data personally reviewed   Author: Tameisha Covell  10/18/2024 3:59 PM  To contact [facility]>   Check the care team in [facility] and look for the attending/consulting [facility] provider listed  Log into [email or web address] and use [place]'s universal password   Go to> [facility]  and find provider  If you still have difficulty reaching the provider, please page the [facility] (Director on Call) for the Hospitalists listed on amion     "

## 2024-10-18 NOTE — Plan of Care (Signed)
" °  Problem: Clinical Measurements: Goal: Respiratory complications will improve Outcome: Progressing Goal: Cardiovascular complication will be avoided Outcome: Progressing   Problem: Elimination: Goal: Will not experience complications related to bowel motility Outcome: Progressing Goal: Will not experience complications related to urinary retention Outcome: Progressing   Problem: Pain Managment: Goal: General experience of comfort will improve and/or be controlled Outcome: Progressing   Problem: Safety: Goal: Ability to remain free from injury will improve Outcome: Progressing   "

## 2024-10-18 NOTE — Progress Notes (Signed)
 Mg 1.5   RN notified Lavanda Horns, NP via secure chat

## 2024-10-19 ENCOUNTER — Other Ambulatory Visit (HOSPITAL_COMMUNITY): Payer: Self-pay

## 2024-10-19 DIAGNOSIS — K529 Noninfective gastroenteritis and colitis, unspecified: Secondary | ICD-10-CM

## 2024-10-19 LAB — BASIC METABOLIC PANEL WITH GFR
Anion gap: 11 (ref 5–15)
BUN: 30 mg/dL — ABNORMAL HIGH (ref 6–20)
CO2: 20 mmol/L — ABNORMAL LOW (ref 22–32)
Calcium: 9 mg/dL (ref 8.9–10.3)
Chloride: 102 mmol/L (ref 98–111)
Creatinine, Ser: 1.83 mg/dL — ABNORMAL HIGH (ref 0.61–1.24)
GFR, Estimated: 42 mL/min — ABNORMAL LOW
Glucose, Bld: 116 mg/dL — ABNORMAL HIGH (ref 70–99)
Potassium: 3.5 mmol/L (ref 3.5–5.1)
Sodium: 133 mmol/L — ABNORMAL LOW (ref 135–145)

## 2024-10-19 LAB — URINE CULTURE: Culture: NO GROWTH

## 2024-10-19 LAB — TACROLIMUS LEVEL: Tacrolimus (FK506) - LabCorp: 4.1 ng/mL — ABNORMAL LOW (ref 5.0–20.0)

## 2024-10-19 LAB — CBC
HCT: 36.4 % — ABNORMAL LOW (ref 39.0–52.0)
Hemoglobin: 11.9 g/dL — ABNORMAL LOW (ref 13.0–17.0)
MCH: 28.1 pg (ref 26.0–34.0)
MCHC: 32.7 g/dL (ref 30.0–36.0)
MCV: 86.1 fL (ref 80.0–100.0)
Platelets: 76 K/uL — ABNORMAL LOW (ref 150–400)
RBC: 4.23 MIL/uL (ref 4.22–5.81)
RDW: 15.2 % (ref 11.5–15.5)
WBC: 2.9 K/uL — ABNORMAL LOW (ref 4.0–10.5)
nRBC: 0 % (ref 0.0–0.2)

## 2024-10-19 MED ORDER — DICYCLOMINE HCL 10 MG PO CAPS
10.0000 mg | ORAL_CAPSULE | Freq: Three times a day (TID) | ORAL | 0 refills | Status: AC
  Filled 2024-10-19: qty 15, 5d supply, fill #0

## 2024-10-19 MED ORDER — ONDANSETRON HCL 4 MG PO TABS
4.0000 mg | ORAL_TABLET | Freq: Every day | ORAL | 1 refills | Status: AC | PRN
  Filled 2024-10-19: qty 30, 30d supply, fill #0

## 2024-10-19 NOTE — Plan of Care (Signed)
  Problem: Clinical Measurements: Goal: Cardiovascular complication will be avoided Outcome: Progressing   Problem: Activity: Goal: Risk for activity intolerance will decrease Outcome: Progressing   Problem: Nutrition: Goal: Adequate nutrition will be maintained Outcome: Progressing   Problem: Coping: Goal: Level of anxiety will decrease Outcome: Progressing   Problem: Elimination: Goal: Will not experience complications related to bowel motility Outcome: Progressing Goal: Will not experience complications related to urinary retention Outcome: Progressing   Problem: Pain Managment: Goal: General experience of comfort will improve and/or be controlled Outcome: Progressing

## 2024-10-19 NOTE — Discharge Summary (Signed)
 Physician Discharge Summary  Jonathon Conley FMW:[id number] DOB: 16-Jan-1966 DOA: 10/17/2024  PCP: Jegede, Olugbemiga E, MD  Admit date: 10/17/2024 Discharge date: 10/19/2024 Discharging to: home     Discharge Diagnoses:   Principal Problem:   Gastroenteritis Active Problems:   Nausea, vomiting, and diarrhea   OSA (obstructive sleep apnea)   AKI (acute kidney injury)   Hyperlipidemia   Renal transplant recipient   Thrombocytopenia   H/O kidney transplant   HTN (hypertension)   Hypokalemia   Hypercalcemia    Brief hospital course: 59 y/o with 2nd renal transplant, HTN, OSA presented for fever, nausea and vomiting. CT abd/pelvis unrevealing. Admitted for further treatment.  K was 2.9 Mg 1.6 Ca 11.1   Subjective:  He tried broth and had diarrhea.    Assessment and Plan: Principal Problem:   Febrile illness with nausea and vomiting in an immune compromised patient - improved with IVF, anti-emetics - tolerating solid food since yesterday   Active Problems:  Hypokalemia/hypomagnesemia - continue to replace   AKI (acute kidney injury)- h/o renal transplant - Cr 1.34 in 03/17/24 - now 1.85- follow and continue IVF - cont tacrolimus  and prednisone    Hypercalcemia - Ca improved to 9.1   Thrombocytopenia - semi acute since this past summer          Discharge Instructions   Allergies as of 10/19/2024       Reactions   Lisinopril  Swelling, Other (See Comments)   Facial swelling        Medication List     TAKE these medications    albuterol  108 (90 Base) MCG/ACT inhaler Commonly known as: VENTOLIN  HFA Inhale 2 puffs into the lungs every 6 (six) hours as needed for wheezing or shortness of breath.   amLODipine  10 MG tablet Commonly known as: NORVASC  Take 10 mg by mouth daily.   aspirin  EC 81 MG tablet Take 81 mg by mouth daily.   carvedilol  25 MG tablet Commonly known as: COREG  Take 25 mg by mouth 2 (two) times daily.   chlorthalidone 25 MG  tablet Commonly known as: HYGROTON Take 25 mg by mouth daily.   dicyclomine  10 MG capsule Commonly known as: BENTYL  Take 1 capsule (10 mg total) by mouth 3 (three) times daily before meals.   empagliflozin 10 MG Tabs tablet Commonly known as: JARDIANCE Take 10 mg by mouth daily.   ondansetron  4 MG tablet Commonly known as: Zofran  Take 1 tablet (4 mg total) by mouth daily as needed for nausea or vomiting.   OXYGEN Inhale 4 L/min into the lungs at bedtime.   predniSONE  5 MG tablet Commonly known as: DELTASONE  Take 5 mg by mouth daily with breakfast.   PRESCRIPTION MEDICATION CPAP- At bedtime   rosuvastatin  5 MG tablet Commonly known as: CRESTOR  Take 5 mg by mouth at bedtime.   sodium chloride  0.65 % Soln nasal spray Commonly known as: OCEAN Place 1 spray into both nostrils at bedtime.   tacrolimus  ER 1 MG Tb24 Commonly known as: ENVARSUS  XR Take 6 mg by mouth daily before breakfast.   tamsulosin  0.4 MG Caps capsule Commonly known as: FLOMAX  Take 0.4 mg by mouth at bedtime.   Tylenol  325 MG tablet Generic drug: acetaminophen  Take 325-650 mg by mouth every 6 (six) hours as needed for mild pain (pain score 1-3).   Vitamin D3 125 MCG (5000 UT) Caps Take 5,000 Units by mouth daily.            The results of significant  diagnostics from this hospitalization (including imaging, microbiology, ancillary and laboratory) are listed below for reference.    CT ABDOMEN PELVIS WO CONTRAST Result Date: 10/17/2024 CLINICAL DATA:  Bilateral flank pain, abdominal distention, nausea, vomiting, and fever. History of renal transplant. EXAM: CT ABDOMEN AND PELVIS WITHOUT CONTRAST TECHNIQUE: Multidetector CT imaging of the abdomen and pelvis was performed following the standard protocol without IV contrast. RADIATION DOSE REDUCTION: This exam was performed according to the departmental dose-optimization program which includes automated exposure control, adjustment of the mA and/or kV  according to patient size and/or use of iterative reconstruction technique. COMPARISON:  CT abdomen and pelvis dated 03/14/2024 FINDINGS: Lower chest: Partially imaged right pleural calcifications/thickening and pleural effusion with multifocal basilar right lower lobe round atelectasis. Left basilar subsegmental atelectasis. No pneumothorax demonstrated. Partially imaged heart size is normal. Hepatobiliary: No focal hepatic lesions. No intra or extrahepatic biliary ductal dilation. Normal gallbladder. Pancreas: No focal lesions or main ductal dilation. Spleen: Normal in size without focal abnormality. Adrenals/Urinary Tract: No adrenal nodules. Atrophic bilateral native kidneys. 1.1 cm posterior right and 1.2 cm exophytic left interpolar simple/minimally complicated cysts. No specific follow-up imaging recommended. Left lower quadrant transplant kidney. No hydronephrosis or calculi. Additional atrophic right lower quadrant transplant kidney. No focal bladder wall thickening. Stomach/Bowel: Normal appearance of the stomach. No evidence of bowel wall thickening, distention, or inflammatory changes. Normal appendix. Vascular/Lymphatic: Aortic atherosclerosis. Right common iliac vein stent in-situ. Peripheral calcification within the IVC near the proximal aspect of the stent, likely chronic thrombus. Subcutaneous varices throughout the anterior abdomen. No enlarged abdominal or pelvic lymph nodes. Reproductive: Enlargement of the prostate with median lobe hypertrophy. Other: No free fluid, fluid collection, or free air. Musculoskeletal: No acute or abnormal lytic or blastic osseous lesions. Small fat-containing paraumbilical and left inguinal hernias. IMPRESSION: 1. No acute abdominopelvic abnormality. 2. Left lower quadrant transplant kidney without hydronephrosis or calculi. 3.  Aortic Atherosclerosis (ICD10-I70.0). Electronically Signed   By: Limin  Xu M.D.   On: 10/17/2024 19:13   DG Chest 1 View Result Date:  10/17/2024 CLINICAL DATA:  Cough EXAM: CHEST  1 VIEW COMPARISON:  03/14/2024, 10/20/2019, CT 05/02/2018 FINDINGS: Volume loss right thorax with chronic pleural thickening and round atelectasis at the right base. Radiographic appearance of the chest appears grossly similar compared with May examination. Stable cardiomediastinal silhouette. No pneumothorax IMPRESSION: Chronic volume loss right thorax with chronic pleural thickening and round atelectasis at the right base. No acute airspace disease. Electronically Signed   By: Luke Bun M.D.   On: 10/17/2024 17:44   Labs:   Basic Metabolic Panel: Recent Labs  Lab 10/17/24 1626 10/17/24 2328 10/18/24 0330 10/19/24 0403  NA 141  --  136 133*  K 2.9*  --  3.4* 3.5  CL 98  --  102 102  CO2 29  --  22 20*  GLUCOSE 185*  --  139* 116*  BUN 29*  --  31* 30*  CREATININE 1.85*  --  1.77* 1.83*  CALCIUM  11.1*  --  9.1 9.0  MG  --  1.6* 1.5*  --      CBC: Recent Labs  Lab 10/17/24 1626 10/18/24 0330 10/19/24 0403  WBC 3.9* 4.7 2.9*  NEUTROABS 3.5  --   --   HGB 15.6 12.9* 11.9*  HCT 48.5 39.3 36.4*  MCV 86.1 84.9 86.1  PLT 107* 97* 76*         SIGNED:   True Atlas, MD  [facility] 10/19/2024, 11:57 AM  Time taking on discharge: 50 minutes

## 2024-10-19 NOTE — Progress Notes (Signed)
Discharge package printed and instructions given to pt. Verbalizes understanding.

## 2024-10-19 NOTE — Progress Notes (Signed)
" °   10/19/24 0251  BiPAP/CPAP/SIPAP  $ Non-Invasive Home Ventilator  Subsequent  BiPAP/CPAP/SIPAP Pt Type Adult (patient self placed)  BiPAP/CPAP/SIPAP Resmed  Mask Type Full face mask  Dentures removed? Not applicable  Mask Size Large  IPAP 12 cmH20  EPAP 6 cmH2O  FiO2 (%) 21 %  Patient Home Machine No  Patient Home Mask No  Patient Home Tubing No  Auto Titrate No  Device Plugged into RED Power Outlet Yes    "

## 2024-10-19 NOTE — TOC Initial Note (Signed)
 Transition of Care [facility]) - Initial/Assessment Note    Patient Details  Name: Jonathon Conley MRN: [id number] Date of Birth: 17-Sep-1966  [facility]) CM/SW Contact:    Sonda Manuella Quill, RN Phone Number: 10/19/2024, 1:09 PM  Clinical Narrative:                 Beatris w/ pt in room; pt said he lives at home w/ spouse Eman Morimoto [phone]); he plans to return w/ her support at d/c; family will provide transportation; insurance/PCP verified; pt denied experiencing SDOH risks; he does not have DME, HH services, or home oxygen; no IP CM needs.  Expected Discharge Plan: Home/Self Care Barriers to Discharge: No Barriers Identified   Patient Goals and CMS Choice Patient states their goals for this hospitalization and ongoing recovery are:: home          Expected Discharge Plan and Services   Discharge Planning Services: CM Consult   Living arrangements for the past 2 months: Single Family Home Expected Discharge Date: 10/19/24               DME Arranged: N/A DME Agency: NA       HH Arranged: NA HH Agency: NA        Prior Living Arrangements/Services Living arrangements for the past 2 months: Single Family Home Lives with:: Spouse Patient language and need for interpreter reviewed:: Yes Do you feel safe going back to the place where you live?: Yes      Need for Family Participation in Patient Care: Yes (Comment) Care giver support system in place?: Yes (comment) Current home services:  (n/a) Criminal Activity/Legal Involvement Pertinent to Current Situation/Hospitalization: No - Comment as needed  Activities of Daily Living   ADL Screening (condition at time of admission) Independently performs ADLs?: Yes (appropriate for developmental age) Is the patient deaf or have difficulty hearing?: No Does the patient have difficulty seeing, even when wearing glasses/contacts?: No Does the patient have difficulty concentrating, remembering, or making  decisions?: No  Permission Sought/Granted Permission sought to share information with : Case Manager Permission granted to share information with : Yes, Verbal Permission Granted  Share Information with NAME: Case Manager     Permission granted to share info w Relationship: Breon Rehm (spouse) [phone]     Emotional Assessment Appearance:: Appears stated age Attitude/Demeanor/Rapport: Gracious Affect (typically observed): Accepting Orientation: : Oriented to Self, Oriented to Place, Oriented to  Time, Oriented to Situation Alcohol / Substance Use: Not Applicable Psych Involvement: No (comment)  Admission diagnosis:  Immunocompromised [D84.9] Febrile illness [R50.9] Fever, unspecified fever cause [R50.9] Nausea and vomiting, unspecified vomiting type [R11.2] Intractable vomiting with nausea [R11.2] Patient Active Problem List   Diagnosis Date Noted   Gastroenteritis 10/19/2024   Nausea, vomiting, and diarrhea 10/17/2024   Hypokalemia 10/17/2024   Hypercalcemia 10/17/2024   Sepsis secondary to UTI (HCC) 03/15/2024   Chronic diastolic heart failure (HCC) 02/28/2020   Ulcerative pancolitis with rectal bleeding (HCC) 07/22/2019   Acute respiratory distress 05/02/2018   Chest pain 05/02/2018   Acute CHF (congestive heart failure) (HCC) 04/09/2018   FUO (fever of unknown origin) 03/13/2018   Viral respiratory infection 03/05/2018   Cough 11/14/2017   Lower respiratory infection 11/14/2017   History of fever 11/14/2017   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) 06/15/2017   SIRS (systemic inflammatory response syndrome) (HCC) 12/19/2016   Hyponatremia 12/19/2016   Anemia of chronic disease 12/19/2016   Postprocedural seroma of a musculoskeletal  structure following other procedure 12/17/2016   Secondary hyperparathyroidism of renal origin 10/15/2016   Abnormal results of liver function studies 04/27/2016   Hematochezia 04/08/2016   HTN (hypertension)  04/08/2016   Intravenous line infection 04/08/2016   Insomnia 01/04/2016   Malignant hypertension 12/14/2015   Pain, unspecified 12/14/2015   Symptomatic anemia 11/03/2015   Chronic anemia 11/03/2015   Thrombocytopenia 11/03/2015   Pleural effusion on right 11/03/2015   Shortness of breath 11/02/2015   Kidney transplant failure 10/17/2015   Sepsis (HCC) 09/23/2015   Accelerated hypertension 09/22/2015   Leg edema, left    HCAP (healthcare-associated pneumonia) 09/21/2015   Hypertensive chronic kidney disease with stage 5 chronic kidney disease or end stage renal disease (HCC) 09/15/2015   Moderate protein-calorie malnutrition 09/10/2015   Encounter for immunization 09/06/2015   Bacterial infection, unspecified 08/24/2015   Iron deficiency anemia, unspecified 08/24/2015   Bacteremia due to Gram-positive bacteria 08/20/2015   Acute blood loss anemia 08/12/2015   C. difficile colitis 08/12/2015   Bleeding gastrointestinal 08/12/2015   Acute-on-chronic renal failure 08/10/2015   H/O kidney transplant 08/08/2015   Non compliance with medical treatment 08/08/2015   History of anemia 08/08/2015   AKI (acute kidney injury) 02/14/2015   Renal transplant recipient 02/14/2015   Acute on chronic diastolic congestive heart failure (HCC) 02/14/2015   Anemia 02/14/2015   Hyperlipidemia    OSA (obstructive sleep apnea) 03/27/2013   PCP:  Jegede, Olugbemiga E, MD Pharmacy:   [facility] [phone] - [place], [place] - [address] [place] [address] Phone: [phone] Fax: [phone]     Social Drivers of Health (SDOH) Social History: SDOH Screenings   Food Insecurity: No Food Insecurity (10/19/2024)  Housing: Low Risk (10/19/2024)  Transportation Needs: No Transportation Needs (10/19/2024)  Utilities: Not At Risk (10/19/2024)  Financial Resource Strain: Low Risk  (03/20/2024)   Received from [facility]  Social Connections: Socially Integrated  (03/15/2024)  Tobacco Use: Low Risk (10/18/2024)   SDOH Interventions: Food Insecurity Interventions: Intervention Not Indicated, Inpatient TOC Housing Interventions: Intervention Not Indicated, Inpatient TOC Transportation Interventions: Intervention Not Indicated, Inpatient TOC Utilities Interventions: Intervention Not Indicated, Inpatient TOC   Readmission Risk Interventions    10/19/2024    1:08 PM  Readmission Risk Prevention Plan  Transportation Screening Complete  PCP or Specialist Appt within 5-7 Days Complete  Home Care Screening Complete  Medication Review (RN CM) Complete

## 2024-10-19 NOTE — Plan of Care (Signed)
   Problem: Activity: Goal: Risk for activity intolerance will decrease Outcome: Progressing   Problem: Pain Managment: Goal: General experience of comfort will improve and/or be controlled Outcome: Progressing   Problem: Safety: Goal: Ability to remain free from injury will improve Outcome: Progressing

## 2024-10-22 NOTE — ED Notes (Signed)
 Lab reports gram positive rods in 1 out of 3 blood culture bottles. Notified EDP Theadore, MD

## 2024-10-23 LAB — CULTURE, BLOOD (ROUTINE X 2)
Culture: NO GROWTH
Special Requests: ADEQUATE

## 2024-10-24 LAB — CULTURE, BLOOD (ROUTINE X 2): Special Requests: ADEQUATE
